# Patient Record
Sex: Male | Born: 1980 | Hispanic: Yes | Marital: Single | Smoking: Current every day smoker
Health system: Southern US, Community
[De-identification: ages and names within clinical notes are randomized; demographics above are authoritative.]

## PROBLEM LIST (undated history)

## (undated) DIAGNOSIS — F10239 Alcohol dependence with withdrawal, unspecified: Secondary | ICD-10-CM

## (undated) DIAGNOSIS — R569 Unspecified convulsions: Secondary | ICD-10-CM

## (undated) DIAGNOSIS — K7460 Unspecified cirrhosis of liver: Secondary | ICD-10-CM

## (undated) DIAGNOSIS — F1010 Alcohol abuse, uncomplicated: Secondary | ICD-10-CM

## (undated) DIAGNOSIS — F10939 Alcohol use, unspecified with withdrawal, unspecified: Secondary | ICD-10-CM

---

## 2014-08-02 ENCOUNTER — Emergency Department (HOSPITAL_COMMUNITY)
Admission: EM | Admit: 2014-08-02 | Discharge: 2014-08-03 | Disposition: A | Discharge status: Home / Self Care | Payer: Self-pay | Attending: Emergency Medicine | Admitting: Emergency Medicine

## 2014-08-02 ENCOUNTER — Encounter (HOSPITAL_COMMUNITY): Payer: Self-pay | Admitting: *Deleted

## 2014-08-02 DIAGNOSIS — M5430 Sciatica, unspecified side: Secondary | ICD-10-CM | POA: Insufficient documentation

## 2014-08-02 DIAGNOSIS — R109 Unspecified abdominal pain: Secondary | ICD-10-CM | POA: Insufficient documentation

## 2014-08-02 DIAGNOSIS — M5432 Sciatica, left side: Secondary | ICD-10-CM

## 2014-08-02 LAB — BASIC METABOLIC PANEL
ANION GAP: 10 (ref 5–15)
BUN: <5 mg/dL — ABNORMAL LOW (ref 6–20)
CALCIUM: 8.7 mg/dL — AB (ref 8.9–10.3)
CO2: 24 mmol/L (ref 22–32)
Chloride: 103 mmol/L (ref 101–111)
Creatinine, Ser: 0.61 mg/dL (ref 0.61–1.24)
GFR calc Af Amer: >60 mL/min (ref >60)
Glucose, Bld: 145 mg/dL — ABNORMAL HIGH (ref 65–99)
POTASSIUM: 3.1 mmol/L — AB (ref 3.5–5.1)
SODIUM: 137 mmol/L (ref 135–145)

## 2014-08-02 LAB — CBC
HEMATOCRIT: 35.5 % — AB (ref 39.0–52.0)
Hemoglobin: 12.8 g/dL — ABNORMAL LOW (ref 13.0–17.0)
MCH: 34.7 pg — AB (ref 26.0–34.0)
MCHC: 36.1 g/dL — ABNORMAL HIGH (ref 30.0–36.0)
MCV: 96.2 fL (ref 78.0–100.0)
Platelets: 152 10*3/uL (ref 150–400)
RBC: 3.69 MIL/uL — ABNORMAL LOW (ref 4.22–5.81)
RDW: 12.5 % (ref 11.5–15.5)
WBC: 5 10*3/uL (ref 4.0–10.5)

## 2014-08-02 LAB — URINALYSIS, ROUTINE W REFLEX MICROSCOPIC
BILIRUBIN URINE: NEGATIVE
Glucose, UA: NEGATIVE mg/dL
Hgb urine dipstick: NEGATIVE
Ketones, ur: NEGATIVE mg/dL
Leukocytes, UA: NEGATIVE
Nitrite: NEGATIVE
Protein, ur: NEGATIVE mg/dL
SPECIFIC GRAVITY, URINE: 1.001 — AB (ref 1.005–1.030)
UROBILINOGEN UA: 0.2 mg/dL (ref 0.0–1.0)
pH: 5.5 (ref 5.0–8.0)

## 2014-08-02 MED ORDER — POTASSIUM CHLORIDE CRYS ER 20 MEQ PO TBCR
40.0000 meq | EXTENDED_RELEASE_TABLET | Freq: Once | ORAL | Status: AC
Start: 2014-08-02 — End: 2014-08-02
  Administered 2014-08-02: 40 meq via ORAL
  Filled 2014-08-02: qty 2

## 2014-08-02 MED ORDER — ONDANSETRON 4 MG PO TBDP
8.0000 mg | ORAL_TABLET | Freq: Once | ORAL | Status: AC
  Administered 2014-08-02: 8 mg via ORAL
  Filled 2014-08-02: qty 2

## 2014-08-02 MED ORDER — OXYCODONE-ACETAMINOPHEN 5-325 MG PO TABS
1.0000 | ORAL_TABLET | Freq: Once | ORAL | Status: AC
  Administered 2014-08-02: 1 via ORAL
  Filled 2014-08-02: qty 1

## 2014-08-02 NOTE — ED Notes (Signed)
Lt flank pain for 2 weeks  No urinary symptoms  No known injury

## 2014-08-02 NOTE — ED Provider Notes (Signed)
CSN: [id number]     Arrival date & time 08/02/14  2017 History   First MD Initiated Contact with Patient 08/02/14 2244     Chief Complaint  Patient presents with  . Flank Pain     (Consider location/radiation/quality/duration/timing/severity/associated sxs/prior Treatment) HPI   34 year old Hispanic male here with L lower back pain.  [employer] interpreter was used.  Patient reports for the past 2 weeks he has had recurrent left low back pain that radiates down to his left leg. Pain is dull and achy sensation worsening with laying flat and with movement. Pain is getting progressively worse. He works in [employer] and it has been affecting his job.  He has been taking over-the-counter medication and receiving massage from his wife without adequate relief. He reports having a fall 3 months ago injuring his back but this pain is different. Pain initially started on the right side but now affecting the left side. There is no associated fever, chills, abdominal pain, dysuria, hematuria, bowel bladder incontinence, saddle anesthesia, focal numbness or weakness. No history of IV drug use, or active cancer. No history of kidney stones in the past. He is here at the urging of his wife due to worsening pain. Pain initially was a 10 out of 10 but has since improved to 3 out of 10 after receiving pain medication in the ER.     History reviewed. No pertinent past medical history. History reviewed. No pertinent past surgical history. No family history on file. History  Substance Use Topics  . Smoking status: Never Smoker   . Smokeless tobacco: Not on file  . Alcohol Use: Yes    Review of Systems  All other systems reviewed and are negative.     Allergies  Review of patient's allergies indicates no known allergies.  Home Medications   Prior to Admission medications   Not on File   BP 132/60 mmHg  Pulse 76  Temp(Src) 98.5 F (36.9 C) (Oral)  Resp 18  SpO2 97% Physical Exam   Constitutional: He appears well-developed and well-nourished. No distress.  HENT:  Head: Atraumatic.  Eyes: Conjunctivae are normal.  Neck: Neck supple.  Abdominal: Soft. There is no tenderness.  Genitourinary:  No CVA tenderness.  Musculoskeletal: He exhibits tenderness (mild lumbar and paraspinal muscle tenderness on palpation without focal point tenderness , crepitus or step-off. No overlying skin changes. Increasing pain with low back flexion and with ambulation).  Neurological: He is alert.  Patellar deep tendon reflex intact bilaterally, no foot drops. 5/5 strength to lower extremities. Able to ambulate.  Skin: No rash noted.  Psychiatric: He has a normal mood and affect.  Nursing note and vitals reviewed.   ED Course  Procedures (including critical care time)  Patient here with radicular back pain consistence with sciatica. No red flags. He is able to ambulate. No symptoms to suggest cauda equina. Do not think advanced imaging is indicated at this time. Plan to provide patient with pain medication, steroid, and work note. Orthopedic referral given as needed.  Labs Review Labs Reviewed  URINALYSIS, ROUTINE W REFLEX MICROSCOPIC (NOT AT [facility]) - Abnormal; Notable for the following:    APPearance CLOUDY (*)    Specific Gravity, Urine 1.001 (*)    All other components within normal limits  BASIC METABOLIC PANEL - Abnormal; Notable for the following:    Potassium 3.1 (*)    Glucose, Bld 145 (*)    BUN <5 (*)    Calcium 8.7 (*)  All other components within normal limits  CBC - Abnormal; Notable for the following:    RBC 3.69 (*)    Hemoglobin 12.8 (*)    HCT 35.5 (*)    MCH 34.7 (*)    MCHC 36.1 (*)    All other components within normal limits    Imaging Review No results found.   EKG Interpretation None      MDM   Final diagnoses:  Left sided sciatica    BP 105/57 mmHg  Pulse 75  Temp(Src) 98.6 F (37 C) (Oral)  Resp 18  SpO2 93%     Fayrene Helper,  PA-C 08/03/14 0022  Pricilla Loveless, MD 08/03/14 [phone]

## 2014-08-03 MED ORDER — OXYCODONE-ACETAMINOPHEN 5-325 MG PO TABS: 1.0000 | ORAL_TABLET | Freq: Four times a day (QID) | ORAL | Status: DC | PRN

## 2014-08-03 MED ORDER — PREDNISONE 20 MG PO TABS: ORAL_TABLET | ORAL | Status: DC

## 2014-08-03 NOTE — Discharge Instructions (Signed)
Ciática °con rehabilitación °(Sciatica with Rehab) °El nervio ciático va desde la región inferior de la espalda hacia la pierna y es el responsable de la sensibilidad y el control de los músculos de la parte de atrás (posterior) del muslo, pierna y pie. La ciática es una enfermedad caracterizada por una inflamación en este nervio.  °SÍNTOMAS °· Signos de daño al nervio, incluso adormecimiento o debilidad en el lado posterior de las extremidades bajas. °· Dolor en la parte posterior del muslo que podría bajar hacia la pierna. °· Dolor que empeora al estar sentado durante largos períodos de tiempo. °· Algunas veces, sensibilidad en las nalgas. °CAUSAS °La causa de la ciática es la inflamación de los nervios ciáticos. La inflamación se debe a que algo irrita el nervio. Entre las causas de la irritación se encuentran: °· Estar sentado durante largos períodos. °· Traumatismos directos al nervio. °· Artritis de la columna. °· Hernia o ruptura de disco. °· Deslizamiento de una vértebra (espondilolistesis). °· Presión de los tejidos blandos, como músculos o el tejidos tipo ligamento (fascia). °LOS RIESGOS AUMENTAN CON: °· Deportes en los que se presiona la columna (fútbol americano o levantamiento de pesas). °· Poca fuerza y flexibilidad. °· No hacer un precalentamiento adecuado. °· Historia familiar de dolor de cintura o trastornos en discos. °· Lesiones o cirugías previas en la espalda. °· Mecánica incorrecta del cuerpo, en especial al levantar, o mala postura. °PREVENCIÓN °· Precalentamiento adecuado y elongación antes de la actividad. °· Mantener la forma física: °¨ Fuerza, flexibilidad y resistencia muscular. °· Capacidad cardiovascular. °· Conozca y use técnicas adecuadas, especialmente en posturas y levantamiento. Cuando sea posible, tener un entrenador que corrija la técnica incorrecta. °· Evite las actividades que tensionen constantemente la columna. °PRONÓSTICO °Si se trata adecuadamente, generalmente es curable  dentro de las 6 semanas. En ocasiones requiere someterse a una cirugía.  °COMPLICACIONES RELACIONADAS °· Daño permanente que incluye dolor, adormecimiento, hormigueo o debilidad. °· Dolor crónico en la espalda. °· Riesgos de la cirugía: infecciones, hemorragias, daño en los nervios, o daños a los tejidos circundantes. °TRATAMIENTO °El tratamiento inicial incluye interrumpir las actividades que agravan los síntomas. Se incluye el uso de medicamentos y la aplicación de hielo para reducir el dolor y la inflamación. Los ejercicios de elongación y fortalecimiento pueden ayudar a reducir el dolor con la actividad. Los ejercicios pueden realizarse en el hogar o con un terapeuta. Un terapeuta podrá recomendarle otros tratamientos, como estimulación nerviosa electrónica transcutánea (TENS) o ultrasonido. En algunos casos se indica una inyección de corticoides para reducir la inflamación del nervio ciático. Si los síntomas persisten por más de 6 meses de tratamiento no quirúrgico (conservador), se indicará la cirugía. °MEDICAMENTOS  °· Si necesita analgésicos, se recomiendan los antiinflamatorios no esteroides, como aspirina e ibuprofeno y otros calmantes menores, como acetaminofeno. °· No tome medicamentos para el dolor dentro de los 7 días previos a la cirugía. °· Los analgésicos prescriptos se indicarán si el médico lo considera necesario. Utilícelos como se le indique y sólo cuando lo necesite. °· Los ungüentos pueden ser beneficiosos. °· En algunos casos se indica una inyección de corticosteroides. Estas inyecciones deben reservarse para los casos graves, porque sólo se pueden administrar una determinada cantidad de veces. °CALOR Y FRÍO  °· El tratamiento con frío alivia el dolor y reduce la inflamación. El frío debe aplicarse durante 10 a 15 minutos cada 2 ó 3 horas para reducir la inflamación y el dolor e inmediatamente después de cualquier actividad que agrava los síntomas.   Utilice bolsas de hielo o masajee la zona  con un trozo de hielo (masaje de hielo). °· El calor puede usarse antes de elongar y de las actividades de fortalecimiento indicadas por el profesional, le fisioterapeuta o el entrenador. Utilice una bolsa térmica o sumerja la lesión en agua caliente. °SOLICITE ATENCIÓN MÉDICA SI: °· El tratamiento parece no ofrecer beneficios, o el trastorno empeora. °· Los medicamentos producen efectos secundarios. °EJERCICIOS °EJERCICIOS DE AMPLITUD DE MOVIMIENTOS Y ELONGACIÓN Ciática °La mayoría de las personas con dolor de ciática encuentran que sus síntomas empeoran con el delantero excesiva flexión (flexión) o arco en la espalda baja (extensión). Los ejercicios que le ayudarán a resolver sus síntomas se centrarán en el movimiento contrario. El médico, fisioterapeuta o entrenador le ayudará a determinar qué ejercicios serán de ayuda para resolver su dolor de espalda. No realice ningún ejercicio sin consultarlo antes con el profesional. Discontinue los ejercicios que empeoran sus síntomas, hasta que hable con el médico. Si siente dolor, entumecimiento u hormigueo que irradia hacia los glúteos, piernas o pies, el objetivo de esta terapia es que estos síntomas se acerquen a la espalda y eventualmente desaparezcan. En ocasiones, los síntomas de la pierna mejorarán, pero el dolor en la espalda puede empeorar; esto es un indicio típico del progreso en la rehabilitación. Asegúrese de que estar atento a cualquier cambio en sus síntomas y las actividades que ha hecho en las 24 horas antes del cambio. Compartir esta información con su médico le permitirá un mejor tratamiento para tratar su enfermedad. °Estos ejercicios le ayudarán en la recuperación de la lesión. Los síntomas podrán aliviarse con o sin asistencia adicional de su médico, fisioterapeuta o entrenador. Al completar estos ejercicios, recuerde:  °· Restaurar la flexibilidad del tejido ayuda a que las articulaciones recuperen el movimiento normal. Esto permite que el  movimiento y la actividad sea más saludables y menos dolorosos. °· Para que sea efectiva, cada elongación debe realizarse durante al menos 30 segundos. °· La elongación nunca debe ser dolorosa. Deberá sentir sólo un alargamiento o distensión suave del tejido que estira. °EJERCICIOS DE AMPLITUD DE MOVIMIENTOS Y ELONGACIÓN: °ELONGACION Flexión - una rodilla al pecho °· Recuéstese en una cama dura o sobre el piso, con ambas piernas extendidas al frente. °· Manteniendo una pierna en contacto con el piso, lleve la rodilla opuesta al pecho. Mantenga la pierna en esa posición, sosteniéndola por la zona posterior del muslo o por la rodilla. °· Presione hasta sentir un suave estiramiento en la cintura. Mantenga esta posición durante __________ segundos. °· Libere la pierna lentamente y repita el ejercicio con el lado opuesto. °Repítalo __________ veces. Realice este estiramiento __________ veces por día.  °ELONGACIÓN Flexión, dos rodillas al pecho  °· Recuéstese en una cama dura o sobre el piso, con ambas piernas extendidas al frente. °· Manteniendo una pierna en contacto con el piso, lleve la rodilla opuesta al pecho. °· Tense los músculos del estómago para apoyar la espalda y levante la otra rodilla hacia el pecho. Mantenga las piernas en su lugar y tómese por detrás de las caderas o las rodillas. °· Con ambas rodillas en el pecho, tire hasta que sienta un estiramiento en la parte trasera de la espalda. Mantenga esta posición durante __________ segundos. °· Tense los músculos del estómago y baje las piernas de a una por vez. °Repítalo __________ veces. Realice este estiramiento __________ veces por día.  °ELONGACIÓN Rotación de la zona baja del tronco. °· Recuéstese sobre una cama firme o sobre el suelo.   Mantenga las piernas al frente, doble las rodillas de modo que ambas apunten hacia el techo y los pies queden bien apoyados en el piso. °· Extienda los brazos a cada lado. Esto estabilizará la zona superior del cuerpo,  manteniendo los hombros en contacto con el piso. °· Con cuidado y lentamente deje caer ambas rodillas juntas hacia un lado, hasta que sienta un suave estiramiento en la espalda baja. Mantenga esta posición durante __________ segundos. °· Tense los músculos del estómago para apoyar la espalda y lleve las rodillas a la posición inicial. Repita el ejercicio hacia el otro lado. °Repítalo __________ veces. Realice este ejercicio __________ veces por día. °EJERCICIOS DE AMPLITUD DE MOVIMIENTOS Y FLEXIBILIDAD: °ELONGACIÓN Extensión posición prona sobre los codos °· Acuéstese sobre el estómago sobre el piso, una cama será muy blanda. Coloque las palmas a una distancia igual al ancho de los hombres y a la altura de la cabeza. °· Coloque los codos bajo los hombros. Si siente dolor, colóquese almohadas debajo del pecho. °· Deje que su cuerpo se relaje, de modo que las caderas queden más abajo y tengan más contacto con el piso. °· Mantenga esta posición durante __________ segundos. °· Vuelva lentamente a la posición plana sobre el piso. °Repítalo __________ veces. Realice este estiramiento __________ veces por día.  °AMPLITUD DE MOVIMIENTOS - Extensión - flexión de brazos en posición prona °· Acuéstese sobre el estómago sobre el piso, una cama será muy blanda. Coloque las palmas a una distancia igual al ancho de los hombres y a la altura de la cabeza. °· Mantenga la espalda tan relajada como pueda, enderece lentamente los codos mientras mantiene las caderas contra el suelo. Puede modificar la posición de las manos para estar más cómodo. A medida que gana movimiento, sus manos quedarán más por debajo de los hombros. °· Mantenga cada posición durante __________ segundos. °· Vuelva lentamente a la posición plana sobre el piso. °Repítalo __________ veces. Realice este estiramiento __________ veces por día.  °EJERCICIOS DE FORTALECIMIENTO - Ciática °Estos ejercicios le ayudarán en la recuperación de la lesión. Estos ejercicios deben  hacerse cerca de su "punto dulce". Este es el arco neutro, de la parte baja de la espalda, en algún lugar entre la posición completamente redondeada y arqueada plenamente, que es la posición menos dolorosa. Cuando se realiza en este nivel de seguridad del movimiento, estos ejercicios se pueden utilizar para las personas que tienen una lesión basada en flexión o extensión. Con estos ejercicios, los síntomas podrán desaparecer con o sin mayor intervención del profesional, el fisioterapeuta o el entrenador. Al completar estos ejercicios, recuerde:  °· Los músculos pueden ganar tanto la resistencia como la fuerza que necesita para sus actividades diarias a través de ejercicios controlados. °· Realice los ejercicios como se lo indicó el médico, el fisioterapeuta o el entrenador. Avance sólo con los ejercicios de resistencia y haga las repeticiones que su médico le indique. °· Podrá experimentar dolor o cansancio muscular, pero el dolor o molestia que trata de eliminar a través de los ejercicios nunca debe empeorar. Si el dolor empeora, deténgase y asegúrese de que está siguiendo las directivas correctamente. Si aún siente dolor luego de realizar lo ajustes necesarios, deberá discontinuar el ejercicio hasta que pueda conversar con el profesional sobre el problema. °FORTALECIMIENTO - Abdominales profundos - Inclinación pélvica °· Recuéstese sobre una cama firme o sobre el suelo. Mantenga las piernas al frente, doble las rodillas de modo que ambas apunten hacia el techo y los pies queden bien apoyados en   el piso. °· Tensione la zona baja de los músculos abdominales para presionar la cintura contra el piso. Este movimiento hará rotar su pelvis de modo que el cóccix quede hacia arriba y no apuntando a los pies o hacia el piso. °· Con una tensión suave y respiración pareja, mantenga esta posición durante __________ segundos. °Repítalo __________ veces. Realice este estiramiento __________ veces por día.  °FORTALECIMIENTO -  Abdominales encogimiento abdominal °· Recuéstese sobre una cama firme o sobre el suelo. Mantenga las piernas al frente, doble las rodillas de modo que ambas apunten hacia el techo y los pies queden bien apoyados en el piso. Cruce las manos sobre el pecho. °· Apunte suavemente con la barbilla hacia abajo, sin doblar el cuello. °· Tensione los abdominales y eleve lentamente el tronco la altura suficiente para despegar los omóplatos. Si se eleva más, pondrá tensión excesiva en la cintura y esto no fortalecerá más los abdominales. °· Controle la vuelta a la posición inicial. °Repítalo __________ veces. Realice este estiramiento __________ veces por día.  °FORTALECIMIENTO - En cuadrúpedo, elevación de miembro superior e inferior opuestos °· Coloque las manos y las rodillas en una superficie firme. Las manos deben quedar a la altura de los hombros y las rodillas debajo de las caderas. Puede colocar algo debajo las rodillas para estar más cómodo. °· Encuentre la posición neutral de la columna vertebral y tensione ligeramente los músculos abdominales de modo que pueda mantener esta posición. Los hombros y las caderas deben formar un rectángulo paralelo con el suelo y recto. °· Manteniendo el tronco firme, eleve la mano derecha a la altura del hombro y luego eleve la pierna izquierda a la altura de la cadera. Asegúrese de no contener la respiración. Mantenga cada posición durante __________ segundos. °· Con los músculos abdominales en tensión y la espalda firme, vuelva lentamente a la posición inicial. Repita con el otro brazo y la otra pierna. °Repítalo __________ veces. Realice este estiramiento __________ veces por día.  °FUERZA Abdominales y cuádriceps - Levantar las piernas rectas °· Recuéstese en una cama dura o sobre el piso, con ambas piernas extendidas al frente. °· Deje una pierna en contacto con el suelo y doble la otra rodilla de manera que el pie quede contra el suelo. °· Encuentre la posición neutral de la  columna vertebral y tensione ligeramente los músculos abdominales de modo que pueda mantener esta posición. °· Levante lentamente la pierna del suelo una 6 pulgadas y cuente hasta 15, asegúrese de no contener la respiración. °· Mantega la columna en posición neutral, y baje lentamente la pierna hasta el suelo. °Repita el ejercicio con cada pierna __________ veces. Realice este estiramiento __________ veces por día. °CONSIDERACIONES ACERCA DE LA POSTURA Y LA MECÁNICA DEL CUERPO Ciática °Si mantiene una postura correcta cuando se encuentre de pie, sentado o realizando sus actividades, reducirá el estrés en los diferentes tejidos del cuerpo, y permitirá a los tejidos lesionados la posibilidad de curarse y limitar las experiencias dolorosas. A continuación se indican pautas generales para mejorar la postura- Su médico o fisioterapeuta le dará instrucciones específicas según sus necesidades. Al leer estas pautas recuerde: °· Los ejercicios indicados por su médico lo ayudarán a lograr la flexibilidad y la fuerza para mantener las posturas correctas. °· Una postura correcta le proporciona a sus articulaciones el medio óptimo para funcionar bien. Las articulaciones se desgastan menos cuando están sostenidas adecuadamente por una columna vertebral en buena postura. Esto significa que su cuerpo estará más sano y dolerá menos. °·   La correcta postura debe practicarse en todas las actividades, especialmente al estar sentado o de pie durante mucho tiempo. También es importante al realizar actividades repetitivas de bajo estrés (tipeo) o una actividad única y pesada. °POSICIONES DE DESCANSO °Tenga en cuenta cuáles son las posturas que más dolor le causan al elegir una posición de descanso. Si siente dolor con las actividades en que deba realizar una flexión (sentarse, inclinarse, detenerse, ponerse en cuclillas), elija una posición que le permita descansar en una postura menos flexionada. Evite curvarse en posición fetal cuando se  encuentre de lado. Si el dolor empeora con las actividades basadas en la extensión (estar de pie durante un tiempo prolongado, trabajar con las manos por arriba de la cabeza) evite descansar en una posición extendida durante mucho tiempo, como dormir sobre el estómago. La mayoría de las personas encontrará cómodo el descanso sobre la columna vertebral en una posición neutral, ni muy redondeada ni muy arqueada. Recuéstese sobre su lado en una cama que no esté hundida con una almohada entre las rodillas o sobre la espalda con una almohada bajo las rodillas, y sentirá alivio. Tenga en cuenta que cualquier posición en tiempo prolongado, no importa si es una postura correcta, puede provocarle rigidez. °POSTURAS CORRECTAS PARA SENTARSE °Con el fin de minimizar el estrés y el malestar en su columna, deberá sentarse con la postura correcta. Esto le ayudará a que el cuerpo esté más sano. Recuperar una buena postura es un proceso gradual. Muchas personas pueden trabajar más cómodas mediante el uso de diferentes soportes hasta que tengan la flexibilidad y la fuerza para mantener esta postura por su cuenta. °Al sentarse con la postura correcta, los oídos deben estar sobre los hombros y los hombros sobre las caderas.  Debe utilizar el respaldo de la silla para apoyar la espalda. La espalda estará en una posición neutral, ligeramente arqueada. Puede colocar una pequeña almohada o toalla doblada en la base de la espalda baja para apoyarse.  °Si trabaja en un escritorio, cree un ambiente que le proporciones un buen soporte y una postura erguida. Sin soporte extra, los músculos se fatigan y causan tensión excesiva en las articulaciones y otros tejidos. Tenga en cuenta estas recomendaciones: °SILLA:  °· La silla debe poder deslizarse por debajo del escritorio cuando su espalda tome contacto con el respaldo. Esto le permitirá trabajar más cerca. °· La altura de la silla debe permitirle que los ojos tengan el nivel de la parte superior  del monitor y las manos estén más abajo que los codos. °POSICIÓN DEL CUERPO °· Los pies deben tener contacto con el piso. Si no es posible, use un posapies. °· Mantenga las orejas sobre los hombros. Esto reducirá el estrés en el cuello y en la cintura. °POSTURAS INCORRECTAS PARA SENTARSE °· Si se siente cansado e incapaz de asumir una postura sentada sana, no se encorve ni se hunda. Esto pone una tensión excesiva en los tejidos de su espalda, y causa más daño y dolor. Entre las opciones más saludables se incluyen: °· El uso de más apoyo, como una almohada lumbar. °· Cambio de tareas, a algo que demande una posición vertical o caminar. °· Tomar una breve caminata. °· Recostarse y descansar en una posición neutral. °DE PIE DURANTE UN TIEMPO PROLONGADO E INCLINADO LIGERAMENTE HACIA ADELANTE °Cuando deba realizar una tarea que requiera inclinación hacia adelante estando de pie en el mismo sitio durante mucho tiempo, coloque un pie en un objeto de 2 a 4 pulgadas de alto, para mantener una mejor postura.   Cuando ambos pies están en el piso, la zona inferior de la espalda tiene a perder su ligera curvatura hacia adentro. Si esta curva se aplana (o se pronuncia demasiado) la espalda y las articulaciones experimentarán demasiado estrés, se fatigarán más rápidamente y causarán dolor.  °POSTURAS CORRECTAS PARA ESTAR DE PIE °Una postura adecuada de pie realizarse en todas las actividades diarias, incluso si sólo toman un momento, como al cepillarse los dientes. Como en la postura de sentado, los oídos deben estar sobre los hombros y los hombros sobre las caderas.  Deberá mantener una ligera tensión en sus músculos abdominales para asegurar la columna vertebral. El cóccix debe apuntar hacia el suelo, no detrás de su cuerpo, por que resulta en una curvatura de la espalda sobre-extendida.  °POSTURAS INCORRECTAS PARA ESTAR DE PIE °Las posturas incorrectas para estar de pie incluyen tener la cabeza hacia delante, las rodillas  bloqueadas o una excesiva curvatura de la espalda. °CAMINAR °Camine en una postura erguida. Las orejas, hombros y caderas deben estar alineados. °ACTIVIDAD PROLONGADA EN UNA POSICIÓN FLEXIONADA °Al completar una tarea que requiere que se doble la cintura hacia adelante o inclinarse sobre una superficie baja, trate de encontrar una manera de estabilizar 3 de cada 4 de sus miembros. Puede colocar una mano o el codo en el muslo, o descansar una rodilla en la superficie en la que está apoyado. Esto le proporcionará más estabilidad para que sus músculos no se cansen tan rápidamente.  El mantener las rodillas relajadas, o ligeramente dobladas, también reducirá el estrés en la espalda baja. °TÉCNICAS CORRECTAS PARA LEVANTAR OBJETOS °SI:  °· Asumir una postura amplia. Esto le proporcionará más estabilidad y la oportunidad de acercarse lo más posible al objeto que se está levantando. °· Tense los abdominales para asegurar la columna vertebral; luego flexione rodillas y caderas. Manteniendo la espalda en una posición neutral, haga el esfuerzo con los músculos de la pierna.  Levántese con las piernas, manteniendo la espalda derecha. °· Pruebe el peso de los objetos desconocidos antes de tratar de levantarlos. °· Trate de mantener los codos hacia abajo y a los lados, con el fin de obtener la fuerza de los hombros al llevar un objeto. °· Siempre pida ayuda a otra persona cuando deba levantar objetos pesados o incómodos °TÉCNICAS INCORRECTAS PARA LEVANTAR OBJETOS °NO:  °· Bloquee rodillas al levantar, aunque sea un objeto pequeño. °· Se doble ni gire. Gire sobre los pies ni los mueva cuando necesite cambiar de dirección. °· Tome conciencia que no puede levantar con seguridad ni un clip de papel, sin una postura correcta. °Document Released: 12/15/2008 Document Revised: 03/21/2011 °[employer]® Patient Information ©2015 [employer]. This information is not intended to replace advice given to you by your health care provider. Make  sure you discuss any questions you have with your health care provider. ° °

## 2015-08-11 ENCOUNTER — Encounter (HOSPITAL_COMMUNITY): Payer: Self-pay | Admitting: Emergency Medicine

## 2015-08-11 ENCOUNTER — Emergency Department (HOSPITAL_COMMUNITY)
Admission: EM | Admit: 2015-08-11 | Discharge: 2015-08-11 | Disposition: A | Discharge status: Home / Self Care | Payer: Self-pay | Attending: Emergency Medicine | Admitting: Emergency Medicine

## 2015-08-11 DIAGNOSIS — S0501XA Injury of conjunctiva and corneal abrasion without foreign body, right eye, initial encounter: Secondary | ICD-10-CM | POA: Insufficient documentation

## 2015-08-11 DIAGNOSIS — Y990 Civilian activity done for income or pay: Secondary | ICD-10-CM | POA: Insufficient documentation

## 2015-08-11 DIAGNOSIS — Y929 Unspecified place or not applicable: Secondary | ICD-10-CM | POA: Insufficient documentation

## 2015-08-11 DIAGNOSIS — X58XXXA Exposure to other specified factors, initial encounter: Secondary | ICD-10-CM | POA: Insufficient documentation

## 2015-08-11 DIAGNOSIS — Y939 Activity, unspecified: Secondary | ICD-10-CM | POA: Insufficient documentation

## 2015-08-11 MED ORDER — FLUORESCEIN SODIUM 1 MG OP STRP
1.0000 | ORAL_STRIP | Freq: Once | OPHTHALMIC | Status: AC
  Administered 2015-08-11: 1 via OPHTHALMIC
  Filled 2015-08-11: qty 1

## 2015-08-11 MED ORDER — TETRACAINE HCL 0.5 % OP SOLN
2.0000 [drp] | Freq: Once | OPHTHALMIC | Status: AC
  Administered 2015-08-11: 2 [drp] via OPHTHALMIC
  Filled 2015-08-11: qty 2

## 2015-08-11 MED ORDER — ERYTHROMYCIN 5 MG/GM OP OINT: 1.0000 "application " | TOPICAL_OINTMENT | Freq: Four times a day (QID) | OPHTHALMIC | 0 refills | Status: DC

## 2015-08-11 NOTE — Discharge Instructions (Signed)
Please read and follow all provided instructions.  Your diagnoses today include:  1. Corneal abrasion, right, initial encounter     Tests performed today include: Visual acuity testing to check your vision Fluorescein dye examination to look for scratches on your eye Vital signs. See below for your results today.   Medications prescribed:   Take any prescribed medications only as directed.  Home care instructions:  Follow any educational materials contained in this packet.  You have a scratch of the eye on the cornea (the clear part of the eye). This condition may be caused by trauma. It is a common problem for people who wear contact lenses. Proper treatment is important. No evidence of infection is noted today, but you could develop an infection called a corneal ulcer or have some retained foreign body that may or may not have been noted today in the Emergency Department. Ulcers are not only painful, but they may also scar the cornea and cause permanent damage to the eye.   If you wear contact lenses, do not use them until your eye caregiver approves. Follow-up care is necessary to be sure the corneal abrasion is healing if not completely resolved in 2-3 days. See your caregiver or eye specialist as suggested for followup.   Follow-up instructions: Please follow-up with the opthalmologist listed in the next 2-3 days for further evaluation of your symptoms.   Return instructions:  Please return to the Emergency Department if you experience worsening symptoms.  Please return immediately if you develop severe pain, pus drainage, new change in vision, or fever. Please return if you have any other emergent concerns.  Additional Information:  Your vital signs today were: BP 131/83 (BP Location: Right Arm)    Pulse 95    Temp 98.2 F (36.8 C) (Oral)    Resp 18    SpO2 97%  If your blood pressure (BP) was elevated above 135/85 this visit, please have this repeated by your doctor within  one month.

## 2015-08-11 NOTE — ED Triage Notes (Signed)
Pt. 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

## 2015-08-11 NOTE — ED Provider Notes (Signed)
[facility]-EMERGENCY DEPT Provider Note   CSN: [id number] Arrival date & time: 08/11/15  2052  First Provider Contact:   First MD Initiated Contact with Patient 08/11/15 2129     By signing my name below, I, Soijett Blue, attest that this documentation has been prepared under the direction and in the presence of Audry Pili, PA-C Electronically Signed: Soijett Blue, ED Scribe. 08/11/15. 9:44 PM.    History   Chief Complaint Chief Complaint  Patient presents with  . Foreign Body in Eye    HPI Mathew [name] is a 35 y.o. male who presents to the Emergency Department complaining of foreign body to right eye onset yesterday. Pt notes that he was sanding stone when some remnants got into his right eye. Pt denies wearing protective goggles at the time of the incident. He states that he is having associated symptoms of 10/10 right eye pain, redness to right eye, and watery right eye. He states that he has tried OTC visine with no relief for his symptoms. He denies fever, chills, color change, rash, wound, and any other symptoms.  The history is provided by the patient and the spouse. A language interpreter was used (Wife and [place]).    History reviewed. No pertinent past medical history.  There are no active problems to display for this patient.   History reviewed. No pertinent surgical history.     Home Medications    Prior to Admission medications   Medication Sig Start Date End Date Taking? Authorizing Provider  oxyCODONE-acetaminophen (PERCOCET/ROXICET) 5-325 MG per tablet Take 1 tablet by mouth every 6 (six) hours as needed for severe pain. 08/03/14   Fayrene Helper, PA-C    Family History No family history on file.  Social History Social History  Substance Use Topics  . Smoking status: Never Smoker  . Smokeless tobacco: Not on file  . Alcohol use Yes     Allergies   Review of patient's allergies indicates no known allergies.   Review of Systems Review of Systems    Constitutional: Negative for chills and fever.  Eyes: Positive for pain (right ) and redness (right eye).       Foreign body sensation to right eye. Watery right eye.   Skin: Negative for color change, rash and wound.     Physical Exam Updated Vital Signs BP 131/83 (BP Location: Right Arm)   Pulse 95   Temp 98.2 F (36.8 C) (Oral)   Resp 18   SpO2 97%   Physical Exam  Constitutional: He is oriented to person, place, and time. He appears well-developed and well-nourished. No distress.  HENT:  Head: Normocephalic and atraumatic.  Eyes: EOM are normal. Lids are everted and swept, no foreign bodies found.  Slit lamp exam:      The right eye shows corneal abrasion and fluorescein uptake.  Positive fluorescein uptake. Corneal abrasion to the 6 o'clock position of right eye. EOM intact. Pupils equal and reactive. Vision unchanged   Neck: Neck supple.  Cardiovascular: Normal rate.   Pulmonary/Chest: Effort normal. No respiratory distress.  Abdominal: He exhibits no distension.  Musculoskeletal: Normal range of motion.  Neurological: He is alert and oriented to person, place, and time.  Skin: Skin is warm and dry.  Psychiatric: He has a normal mood and affect. His behavior is normal.  Nursing note and vitals reviewed.  ED Treatments / Results  DIAGNOSTIC STUDIES: Oxygen Saturation is 97% on RA, nl by my interpretation.    COORDINATION OF CARE: 9:42  PM Discussed treatment plan with pt at bedside which includes abx eye drops and referral and follow up with opthalmology,  and pt agreed to plan.     Procedures Procedures (including critical care time)  Medications Ordered in ED Medications  fluorescein ophthalmic strip 1 strip (1 strip Right Eye Given 08/11/15 2120)  tetracaine (PONTOCAINE) 0.5 % ophthalmic solution 2 drop (2 drops Right Eye Given 08/11/15 2120)     Initial Impression / Assessment and Plan / ED Course  I have reviewed the triage vital signs and the nursing  notes.   Clinical Course    Final Clinical Impressions(s) / ED Diagnoses  I have reviewed the relevant previous healthcare records. I obtained HPI from historian.   ED Course:  Assessment: Pt with corneal abrasion on exam. No evidence of FB. Exam not concerning for orbital cellulitis, hyphema. No concern for uveitis. Patient will be discharged home with erythromycin Rx. Patient understands to follow up with ophthalmology; return precautions discussed. Patient appears safe for discharged.   Disposition/Plan:  DC Home Additional Verbal discharge instructions given and discussed with patient.  Pt Instructed to f/u with Optho in the next week for evaluation and treatment of symptoms. Return precautions given Pt acknowledges and agrees with plan  Supervising Physician Margarita Grizzle, MD   Final diagnoses:  Corneal abrasion, right, initial encounter    New Prescriptions New Prescriptions   No medications on file   I personally performed the services described in this documentation, which was scribed in my presence. The recorded information has been reviewed and is accurate.     Audry Pili, PA-C 08/11/15 2149    Margarita Grizzle, MD 08/15/15 [phone]

## 2015-08-11 NOTE — ED Notes (Signed)
Patient able to ambulate independently  

## 2015-10-16 ENCOUNTER — Emergency Department (HOSPITAL_COMMUNITY)
Admission: EM | Admit: 2015-10-16 | Discharge: 2015-10-16 | Disposition: A | Discharge status: Home / Self Care | Payer: Self-pay | Attending: Emergency Medicine | Admitting: Emergency Medicine

## 2015-10-16 ENCOUNTER — Encounter (HOSPITAL_COMMUNITY): Payer: Self-pay

## 2015-10-16 ENCOUNTER — Emergency Department (HOSPITAL_COMMUNITY): Payer: Self-pay

## 2015-10-16 DIAGNOSIS — R569 Unspecified convulsions: Secondary | ICD-10-CM | POA: Insufficient documentation

## 2015-10-16 DIAGNOSIS — R930 Abnormal findings on diagnostic imaging of skull and head, not elsewhere classified: Secondary | ICD-10-CM | POA: Insufficient documentation

## 2015-10-16 LAB — CBC WITH DIFFERENTIAL/PLATELET
Basophils Absolute: 0 10*3/uL (ref 0.0–0.1)
Basophils Relative: 0 %
EOS ABS: 0.1 10*3/uL (ref 0.0–0.7)
EOS PCT: 3 %
HCT: 37.3 % — ABNORMAL LOW (ref 39.0–52.0)
Hemoglobin: 12.7 g/dL — ABNORMAL LOW (ref 13.0–17.0)
LYMPHS ABS: 0.6 10*3/uL — AB (ref 0.7–4.0)
LYMPHS PCT: 12 %
MCH: 34 pg (ref 26.0–34.0)
MCHC: 34 g/dL (ref 30.0–36.0)
MCV: 100 fL (ref 78.0–100.0)
MONOS PCT: 7 %
Monocytes Absolute: 0.3 10*3/uL (ref 0.1–1.0)
Neutro Abs: 3.7 10*3/uL (ref 1.7–7.7)
Neutrophils Relative %: 78 %
PLATELETS: 95 10*3/uL — AB (ref 150–400)
RBC: 3.73 MIL/uL — ABNORMAL LOW (ref 4.22–5.81)
RDW: 12 % (ref 11.5–15.5)
WBC: 4.7 10*3/uL (ref 4.0–10.5)

## 2015-10-16 LAB — BASIC METABOLIC PANEL
Anion gap: 13 (ref 5–15)
BUN: 5 mg/dL — ABNORMAL LOW (ref 6–20)
CO2: 21 mmol/L — AB (ref 22–32)
CREATININE: 0.65 mg/dL (ref 0.61–1.24)
Calcium: 9.3 mg/dL (ref 8.9–10.3)
Chloride: 102 mmol/L (ref 101–111)
GFR calc Af Amer: >60 mL/min (ref >60)
GFR calc non Af Amer: >60 mL/min (ref >60)
GLUCOSE: 107 mg/dL — AB (ref 65–99)
POTASSIUM: 3.3 mmol/L — AB (ref 3.5–5.1)
Sodium: 136 mmol/L (ref 135–145)

## 2015-10-16 LAB — ETHANOL

## 2015-10-16 LAB — CBG MONITORING, ED: Glucose-Capillary: 107 mg/dL — ABNORMAL HIGH (ref 65–99)

## 2015-10-16 MED ORDER — FOLIC ACID 1 MG PO TABS: 1.0000 mg | ORAL_TABLET | Freq: Every day | ORAL | 0 refills | Status: DC

## 2015-10-16 MED ORDER — POTASSIUM CHLORIDE CRYS ER 20 MEQ PO TBCR
40.0000 meq | EXTENDED_RELEASE_TABLET | Freq: Once | ORAL | Status: AC
  Administered 2015-10-16: 40 meq via ORAL
  Filled 2015-10-16: qty 2

## 2015-10-16 MED ORDER — LORAZEPAM 2 MG/ML IJ SOLN
1.0000 mg | Freq: Once | INTRAMUSCULAR | Status: AC
Start: 2015-10-16 — End: 2015-10-16
  Administered 2015-10-16: 1 mg via INTRAVENOUS
  Filled 2015-10-16: qty 1

## 2015-10-16 MED ORDER — VITAMIN B-1 100 MG PO TABS
100.0000 mg | ORAL_TABLET | Freq: Every day | ORAL | Status: DC
  Administered 2015-10-16: 100 mg via ORAL
  Filled 2015-10-16: qty 1

## 2015-10-16 MED ORDER — SODIUM CHLORIDE 0.9 % IV BOLUS (SEPSIS)
1000.0000 mL | Freq: Once | INTRAVENOUS | Status: AC
  Administered 2015-10-16: 1000 mL via INTRAVENOUS

## 2015-10-16 MED ORDER — THIAMINE HCL 100 MG/ML IJ SOLN: 100.0000 mg | Freq: Every day | INTRAMUSCULAR | Status: DC

## 2015-10-16 MED ORDER — VITAMIN B-1 100 MG PO TABS: 100.0000 mg | ORAL_TABLET | Freq: Every day | ORAL | 0 refills | Status: DC

## 2015-10-16 NOTE — ED Notes (Signed)
CBG 107 

## 2015-10-16 NOTE — ED Triage Notes (Signed)
Pt presents with witnessed seizure by coworker that lasted approximately 4-5 minutes.  Per EMS, pt was postictal on scene but due to language barrier, unsure of GCS.  No incontinence reported.  Pt is heavy daily drinker

## 2015-10-16 NOTE — ED Notes (Signed)
Pt ambulated in hall; tolerated well 

## 2015-10-16 NOTE — ED Provider Notes (Signed)
[facility] Provider Note   CSN: [id number] Arrival date & time: 10/16/15  0804     History   Chief Complaint Chief Complaint  Patient presents with  . Seizures    HPI Mathew [name] is a 35 y.o. male.  HPI   35 year old Hispanic male brought here via EMS for evaluation of a witnessed seizure activity. History obtained through interpreter via video conference. Patient reports he was going to work with his coworker this a.m. When they stopped at a gas station to get gas, he had a witnessed seizure episode that may have lasted for several minutes. The only thing that he can read member was finding himself in the ambulance on his way to the ER. At this time patient denies having any specific symptoms. He denies having any headache, lightheadedness, dizziness, chest pain, shortness of breath, abdominal pain, back pain, focal numbness or weakness. He denies tongue biting, bowel bladder incontinence. He has never had seizures before. He admits to drinking alcohol on a daily basis, last alcohol consumption was yesterday and quantify as two 24 ounce beer which is his usual amount of consumption.  He however admits that he has a drinking problem. He denies tobacco abuse or any recreational drug use. He is not on any daily medication. Denies any other environmental changes. Family member who is at bedside states that patient does abuse alcohol. He also has a daily tremor.  History reviewed. No pertinent past medical history.  There are no active problems to display for this patient.   History reviewed. No pertinent surgical history.     Home Medications    Prior to Admission medications   Medication Sig Start Date End Date Taking? Authorizing Provider  erythromycin [facility]) ophthalmic ointment Place 1 application into the right eye 4 (four) times daily. For 5 days 08/11/15   Audry Pili, PA-C  oxyCODONE-acetaminophen (PERCOCET/ROXICET) 5-325 MG per tablet Take 1 tablet by mouth every 6  (six) hours as needed for severe pain. 08/03/14   Fayrene Helper, PA-C    Family History History reviewed. No pertinent family history.  Social History Social History  Substance Use Topics  . Smoking status: Never Smoker  . Smokeless tobacco: Never Used  . Alcohol use Yes     Allergies   Review of patient's allergies indicates no known allergies.   Review of Systems Review of Systems  All other systems reviewed and are negative.    Physical Exam Updated Vital Signs BP 133/78   Pulse 96   Temp 98.1 F (36.7 C) (Oral)   Resp 20   SpO2 99%   Physical Exam  Constitutional: He appears well-developed and well-nourished. No distress.  Hispanic male, laying in bed in no acute discomfort.  HENT:  Head: Atraumatic.  No tongue injury, no scalp tenderness or facial tenderness.  Eyes: Conjunctivae and EOM are normal. Pupils are equal, round, and reactive to light.  Neck: Normal range of motion. Neck supple.  No cervical midline spine tenderness crepitus or step off  Cardiovascular: Normal rate and regular rhythm.   Pulmonary/Chest: Effort normal and breath sounds normal.  Abdominal: Soft. Bowel sounds are normal. There is no tenderness.  Musculoskeletal:  5 out 5 strength to all 4 extremities  Neurological: He is alert.  Essential tremor to both hands.  Skin: No rash noted.  Psychiatric: He has a normal mood and affect.  Nursing note and vitals reviewed.    ED Treatments / Results  Labs (all labs ordered are listed, but  only abnormal results are displayed) Labs Reviewed  BASIC METABOLIC PANEL - Abnormal; Notable for the following:       Result Value   Potassium 3.3 (*)    CO2 21 (*)    Glucose, Bld 107 (*)    BUN 5 (*)    All other components within normal limits  CBC WITH DIFFERENTIAL/PLATELET - Abnormal; Notable for the following:    RBC 3.73 (*)    Hemoglobin 12.7 (*)    HCT 37.3 (*)    Platelets 95 (*)    Lymphs Abs 0.6 (*)    All other components within  normal limits  CBG MONITORING, ED - Abnormal; Notable for the following:    Glucose-Capillary 107 (*)    All other components within normal limits  ETHANOL  URINE RAPID DRUG SCREEN, HOSP PERFORMED  URINALYSIS, ROUTINE W REFLEX MICROSCOPIC (NOT AT [facility])    EKG  EKG Interpretation None       Radiology Ct Head Wo Contrast  Result Date: 10/16/2015 CLINICAL DATA:  Seizure. EXAM: CT HEAD WITHOUT CONTRAST TECHNIQUE: Contiguous axial images were obtained from the base of the skull through the vertex without intravenous contrast. COMPARISON:  None FINDINGS: Brain: No evidence of acute infarction, hemorrhage, hydrocephalus, extra-axial collection or mass lesion/mass effect. Vascular: No hyperdense vessel or unexpected calcification. Skull: Normal. Negative for fracture or focal lesion. Sinuses/Orbits: No acute finding. Other: None. IMPRESSION: 1. Normal brain.  No acute intracranial abnormalities identified. Electronically Signed   By: Signa Kell M.D.   On: 10/16/2015 09:57    Procedures Procedures (including critical care time)  Medications Ordered in ED Medications  thiamine (B-1) injection 100 mg (100 mg Intravenous Not Given 10/16/15 1003)  thiamine (VITAMIN B-1) tablet 100 mg (100 mg Oral Given 10/16/15 0849)  LORazepam (ATIVAN) injection 1 mg (1 mg Intravenous Given 10/16/15 0849)  sodium chloride 0.9 % bolus 1,000 mL (0 mLs Intravenous Stopped 10/16/15 1028)  potassium chloride SA (K-DUR,KLOR-CON) CR tablet 40 mEq (40 mEq Oral Given 10/16/15 1208)     Initial Impression / Assessment and Plan / ED Course  I have reviewed the triage vital signs and the nursing notes.  Pertinent labs & imaging results that were available during my care of the patient were reviewed by me and considered in my medical decision making (see chart for details).  Clinical Course    BP 105/65   Pulse 81   Temp 98.1 F (36.7 C) (Oral)   Resp 16   SpO2 99%    Final Clinical Impressions(s) / ED  Diagnoses   Final diagnoses:  Alcohol related seizure (HCC)    New Prescriptions New Prescriptions   FOLIC ACID (FOLVITE) 1 MG TABLET    Take 1 tablet (1 mg total) by mouth daily.   THIAMINE (VITAMIN B-1) 100 MG TABLET    Take 1 tablet (100 mg total) by mouth daily.   9:14 AM Patient here with new onset seizure suspect likely from alcohol abuse. No significant signs of physical injury. Does exhibits a tremor which is not new. Workup initiated. Will monitor closely.  1:22 PM Head CT scan without acute abnormalities. Mild hypokalemia with a potassium of 3.3, supplementation given. Electrolytes otherwise reassuring. Patient is back to his baseline. He is able to ambulate without difficulty. Patient understands to quit drinking alcohol as this may worsen his condition. I encouraged patient to follow-up with neurology for further management of his condition. He should avoid driving or operating heavy machinery until cleared by  neurologist. Return precaution discussed. Language interpreter was used to facilitate communication.   Fayrene HelperBowie Kacia Halley, PA-C 10/16/15 1323    Charlynne Panderavid Hsienta Yao, MD 10/16/15 2004

## 2015-10-16 NOTE — Discharge Instructions (Signed)
Please avoid driving and follow up with neurology next week for further evaluation of your seizure.  Your seizure is likely due to alcohol abuse.  Please quit drinking alcohol to prevent further seizure. Return if your condition worsen.

## 2016-03-10 ENCOUNTER — Emergency Department (HOSPITAL_COMMUNITY)
Admission: EM | Admit: 2016-03-10 | Discharge: 2016-03-11 | Disposition: A | Discharge status: Home / Self Care | Payer: Self-pay | Attending: Emergency Medicine | Admitting: Emergency Medicine

## 2016-03-10 ENCOUNTER — Encounter (HOSPITAL_COMMUNITY): Payer: Self-pay

## 2016-03-10 DIAGNOSIS — R1010 Upper abdominal pain, unspecified: Secondary | ICD-10-CM

## 2016-03-10 DIAGNOSIS — K2920 Alcoholic gastritis without bleeding: Secondary | ICD-10-CM | POA: Insufficient documentation

## 2016-03-10 DIAGNOSIS — R7989 Other specified abnormal findings of blood chemistry: Secondary | ICD-10-CM | POA: Insufficient documentation

## 2016-03-10 DIAGNOSIS — F1010 Alcohol abuse, uncomplicated: Secondary | ICD-10-CM | POA: Insufficient documentation

## 2016-03-10 DIAGNOSIS — R945 Abnormal results of liver function studies: Secondary | ICD-10-CM

## 2016-03-10 LAB — COMPREHENSIVE METABOLIC PANEL
ALT: 74 U/L — AB (ref 17–63)
AST: 199 U/L — ABNORMAL HIGH (ref 15–41)
Albumin: 4.6 g/dL (ref 3.5–5.0)
Alkaline Phosphatase: 134 U/L — ABNORMAL HIGH (ref 38–126)
Anion gap: 11 (ref 5–15)
BUN: <5 mg/dL — ABNORMAL LOW (ref 6–20)
CHLORIDE: 104 mmol/L (ref 101–111)
CO2: 26 mmol/L (ref 22–32)
Calcium: 9.2 mg/dL (ref 8.9–10.3)
Creatinine, Ser: 0.58 mg/dL — ABNORMAL LOW (ref 0.61–1.24)
Glucose, Bld: 124 mg/dL — ABNORMAL HIGH (ref 65–99)
POTASSIUM: 3.9 mmol/L (ref 3.5–5.1)
SODIUM: 141 mmol/L (ref 135–145)
Total Bilirubin: 0.9 mg/dL (ref 0.3–1.2)
Total Protein: 8.2 g/dL — ABNORMAL HIGH (ref 6.5–8.1)

## 2016-03-10 LAB — URINALYSIS, ROUTINE W REFLEX MICROSCOPIC
BACTERIA UA: NONE SEEN
Bilirubin Urine: NEGATIVE
GLUCOSE, UA: NEGATIVE mg/dL
KETONES UR: NEGATIVE mg/dL
LEUKOCYTES UA: NEGATIVE
Nitrite: NEGATIVE
PROTEIN: NEGATIVE mg/dL
SQUAMOUS EPITHELIAL / LPF: NONE SEEN
Specific Gravity, Urine: 1.002 — ABNORMAL LOW (ref 1.005–1.030)
WBC UA: NONE SEEN WBC/hpf (ref 0–5)
pH: 7 (ref 5.0–8.0)

## 2016-03-10 LAB — LIPASE, BLOOD: LIPASE: 84 U/L — AB (ref 11–51)

## 2016-03-10 LAB — CBC
HEMATOCRIT: 40.6 % (ref 39.0–52.0)
HEMOGLOBIN: 13.9 g/dL (ref 13.0–17.0)
MCH: 32.6 pg (ref 26.0–34.0)
MCHC: 34.2 g/dL (ref 30.0–36.0)
MCV: 95.3 fL (ref 78.0–100.0)
Platelets: 114 10*3/uL — ABNORMAL LOW (ref 150–400)
RBC: 4.26 MIL/uL (ref 4.22–5.81)
RDW: 13.5 % (ref 11.5–15.5)
WBC: 5.5 10*3/uL (ref 4.0–10.5)

## 2016-03-10 NOTE — ED Triage Notes (Signed)
PER EMS: pt sent to triage upon arrival - pt c/o RUQ abdominal pain intermittent for 12 days associated with nausea and vomiting when the pain comes. No vomiting or nausea currently. Pt refused IV and meds from EMS en route. A&Ox4, ambulatory. BP-144/100, HR104, 98% RA, RR-18

## 2016-03-11 ENCOUNTER — Emergency Department (HOSPITAL_COMMUNITY): Payer: Self-pay

## 2016-03-11 ENCOUNTER — Encounter (HOSPITAL_COMMUNITY): Payer: Self-pay | Admitting: Radiology

## 2016-03-11 LAB — POC OCCULT BLOOD, ED: Fecal Occult Bld: NEGATIVE

## 2016-03-11 MED ORDER — PANTOPRAZOLE SODIUM 40 MG PO TBEC
40.0000 mg | DELAYED_RELEASE_TABLET | Freq: Once | ORAL | Status: AC
  Administered 2016-03-11: 40 mg via ORAL
  Filled 2016-03-11: qty 1

## 2016-03-11 MED ORDER — SODIUM CHLORIDE 0.9 % IV SOLN
INTRAVENOUS | Status: DC
  Administered 2016-03-11: 03:00:00 via INTRAVENOUS

## 2016-03-11 MED ORDER — MORPHINE SULFATE (PF) 4 MG/ML IV SOLN
4.0000 mg | Freq: Once | INTRAVENOUS | Status: AC
Start: 2016-03-11 — End: 2016-03-11
  Administered 2016-03-11: 4 mg via INTRAVENOUS
  Filled 2016-03-11: qty 1

## 2016-03-11 MED ORDER — OMEPRAZOLE 40 MG PO CPDR: 40.0000 mg | DELAYED_RELEASE_CAPSULE | Freq: Every day | ORAL | 1 refills | Status: DC

## 2016-03-11 MED ORDER — IOPAMIDOL (ISOVUE-300) INJECTION 61%
INTRAVENOUS | Status: AC
  Administered 2016-03-11: 100 mL
  Filled 2016-03-11: qty 100

## 2016-03-11 MED ORDER — SODIUM CHLORIDE 0.9 % IV BOLUS (SEPSIS)
1000.0000 mL | Freq: Once | INTRAVENOUS | Status: AC
  Administered 2016-03-11: 1000 mL via INTRAVENOUS

## 2016-03-11 MED ORDER — PROMETHAZINE HCL 25 MG PO TABS: 25.0000 mg | ORAL_TABLET | Freq: Four times a day (QID) | ORAL | 0 refills | Status: DC | PRN

## 2016-03-11 MED ORDER — ONDANSETRON HCL 4 MG/2ML IJ SOLN
4.0000 mg | Freq: Once | INTRAMUSCULAR | Status: AC
  Administered 2016-03-11: 4 mg via INTRAVENOUS
  Filled 2016-03-11: qty 2

## 2016-03-11 MED ORDER — VITAMIN B-1 100 MG PO TABS
100.0000 mg | ORAL_TABLET | Freq: Once | ORAL | Status: AC
  Administered 2016-03-11: 100 mg via ORAL
  Filled 2016-03-11: qty 1

## 2016-03-11 NOTE — ED Provider Notes (Addendum)
By signing my name below, I, [name], attest that this documentation has been prepared under the direction and in the presence of Kendalynn Wideman N Mykeisha Dysert, DO. Electronically Signed: [name], ED Scribe. 03/11/16. 12:35 AM.   TIME SEEN: 12:27 AM   CHIEF COMPLAINT:  Chief Complaint  Patient presents with  . Abdominal Pain     HPI:  HPI Comments: Mathew [name] is a 36 y.o. male otherwise healthy who presents to the Emergency Department complaining of moderate epigastric and RUQ abdominal pain for 2 weeks. Pt reports associated nausea, vomiting, subjective fever, melena. Last episode of Dark stools was yesterday. Pt states he has been taking Alka seltzer and Pepto bismol for his symptoms without adequate relief. Pt drinks alcohol daily. No recent travel outside the country. He is not taking any blood thinners. He denies illicit drug use or excessive NSAID use. No h/o abdominal surgeries. NKDA. Pt denies diarrhea, hematochezia.    ROS: See HPI Constitutional: + subjective fever  Eyes: no drainage  ENT: no runny nose   Cardiovascular:  no chest pain  Resp: no SOB  GI: no hematochezia, diarrhea. +abdominal pain, nausea, vomiting, melena  GU: no dysuria Integumentary: no rash  Allergy: no hives  Musculoskeletal: no leg swelling  Neurological: no slurred speech ROS otherwise negative  PAST MEDICAL HISTORY/PAST SURGICAL HISTORY:  History reviewed. No pertinent past medical history.  MEDICATIONS:  Prior to Admission medications   Medication Sig Start Date End Date Taking? Authorizing Provider  folic acid (FOLVITE) 1 MG tablet Take 1 tablet (1 mg total) by mouth daily. 10/16/15   Fayrene Helper, PA-C  thiamine (VITAMIN B-1) 100 MG tablet Take 1 tablet (100 mg total) by mouth daily. 10/16/15   Fayrene Helper, PA-C    ALLERGIES:  No Known Allergies  SOCIAL HISTORY:  Social History  Substance Use Topics  . Smoking status: Never Smoker  . Smokeless tobacco: Never Used  . Alcohol use Yes    FAMILY  HISTORY: No family history on file.  EXAM: BP 123/80 (BP Location: Right Arm)   Pulse 79   Temp 98.2 F (36.8 C) (Oral)   Resp 18   SpO2 100%  CONSTITUTIONAL: Alert and oriented and responds appropriately to questions. Well-appearing; well-nourished HEAD: Normocephalic EYES: Conjunctivae clear, PERRL, EOMI ENT: normal nose; no rhinorrhea; moist mucous membranes NECK: Supple, no meningismus, no nuchal rigidity, no LAD  CARD: RRR; S1 and S2 appreciated; no murmurs, no clicks, no rubs, no gallops RESP: Normal chest excursion without splinting or tachypnea; breath sounds clear and equal bilaterally; no wheezes, no rhonchi, no rales, no hypoxia or respiratory distress, speaking full sentences ABD/GI: Normal bowel sounds; non-distended; soft, no rebound, no guarding, no peritoneal signs, no hepatosplenomegaly. TTP to epigastric and RUQ regions. Negative murphy's sign.  RECTAL:  Normal rectal tone, no gross blood or melena, guaiac negative, no hemorrhoids appreciated, nontender rectal exam, no fecal impaction. Chaperone present throughout entire exam.  Normal brown soft stool in rectal vault. BACK:  The back appears normal and is non-tender to palpation, there is no CVA tenderness EXT: Normal ROM in all joints; non-tender to palpation; no edema; normal capillary refill; no cyanosis, no calf tenderness or swelling    SKIN: Normal color for age and race; warm; no rash NEURO: Moves all extremities equally, sensation to light touch intact diffusely, cranial nerves II through XII intact, normal speech PSYCH: The patient's mood and manner are appropriate. Grooming and personal hygiene are appropriate.  MEDICAL DECISION MAKING: Patient here with  upper abdominal pain, right upper quadrant pain. Does have a history of heavy alcohol use. Family reports subjective fevers, vomiting for the past 2 weeks. Labs show no leukocytosis. He does have elevation of his liver function tests and lipase. He is Hemoccult  negative here. I suspect that his dark-colored stools are from taking Pepto-Bismol. He has not been vomiting any blood, coffee-ground emesis. Will obtain right upper quadrant ultrasound for further evaluation. Will give IV fluids, pain and nausea medicine.  ED PROGRESS: 2:30 AM  Pt's gallbladder is mildly distended but otherwise unremarkable. No obstruction or signs of cholecystitis. He has fatty infiltration of the liver. Still having tenderness in the epigastric area with elevated lipase. We'll obtain CT scan for further evaluation for pancreatitis. Suspect now LFTs, lipase elevated secondary to chronic alcohol use.   3:45 AM  Pt's CT scan shows no acute abnormality. Gallbladder is somewhat distended but otherwise unremarkable. He has heterogeneous appearance of the liver questioning mild venous congestion. Discussed with patient and family were at bedside that this is likely secondary to his alcohol abuse. Have recommended close PCP as well as gastroenterology follow-up. Recommended he stop drinking alcohol. 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 We'll discharge on omeprazole and Phenergan. We'll discharge with outpatient resources for PCP, GI and rehab. Given his history of alcohol abuse, do not feel comfortable discharging him with narcotic pain medication. He has had withdrawal seizure once before.  Discussed with patient and family that he would need outpatient rehabilitation to stop drinking alcohol. Discussed return precautions. He is comfortable with this plan.   At this time, I do not feel there is any life-threatening condition present. I have reviewed and discussed all results (EKG, imaging, lab, urine as appropriate) and exam findings with patient/family. I have reviewed nursing notes and appropriate previous records.  I feel the patient is safe to be discharged home without further emergent workup and can continue workup as an outpatient as needed. Discussed usual and  customary return precautions. Patient/family verbalize understanding and are comfortable with this plan.  Outpatient follow-up has been provided. All questions have been answered.   I personally performed the services described in this documentation, which was scribed in my presence. The recorded information has been reviewed and is accurate.    Layla MawKristen N Kaley Jutras, DO 03/11/16 0345    Layla MawKristen N Breana Litts, DO 03/11/16 [id number]

## 2016-03-11 NOTE — ED Notes (Signed)
Patient transported to [facility] 

## 2016-03-11 NOTE — Discharge Instructions (Addendum)
I recommend you do not take Tylenol, ibuprofen, aspirin, Aleve or goody powders over-the-counter. I recommend close follow-up with a primary care physician and gastroenterologist. I recommend you stop drinking alcohol.

## 2016-03-11 NOTE — ED Notes (Signed)
Pt returned from CT °

## 2016-07-08 ENCOUNTER — Inpatient Hospital Stay (HOSPITAL_COMMUNITY)
Admission: EM | Admit: 2016-07-08 | Discharge: 2016-07-10 | DRG: 896 | Disposition: A | Discharge status: Home / Self Care | Payer: Self-pay | Attending: Internal Medicine | Admitting: Internal Medicine

## 2016-07-08 ENCOUNTER — Emergency Department (HOSPITAL_COMMUNITY): Payer: Self-pay

## 2016-07-08 DIAGNOSIS — F1010 Alcohol abuse, uncomplicated: Secondary | ICD-10-CM

## 2016-07-08 DIAGNOSIS — R4182 Altered mental status, unspecified: Secondary | ICD-10-CM

## 2016-07-08 DIAGNOSIS — D509 Iron deficiency anemia, unspecified: Secondary | ICD-10-CM | POA: Diagnosis present

## 2016-07-08 DIAGNOSIS — F10921 Alcohol use, unspecified with intoxication delirium: Secondary | ICD-10-CM

## 2016-07-08 DIAGNOSIS — G9340 Encephalopathy, unspecified: Secondary | ICD-10-CM | POA: Diagnosis present

## 2016-07-08 DIAGNOSIS — F10939 Alcohol use, unspecified with withdrawal, unspecified: Secondary | ICD-10-CM

## 2016-07-08 DIAGNOSIS — F10221 Alcohol dependence with intoxication delirium: Principal | ICD-10-CM | POA: Diagnosis present

## 2016-07-08 DIAGNOSIS — D649 Anemia, unspecified: Secondary | ICD-10-CM

## 2016-07-08 DIAGNOSIS — R7989 Other specified abnormal findings of blood chemistry: Secondary | ICD-10-CM

## 2016-07-08 DIAGNOSIS — D696 Thrombocytopenia, unspecified: Secondary | ICD-10-CM

## 2016-07-08 DIAGNOSIS — E876 Hypokalemia: Secondary | ICD-10-CM

## 2016-07-08 DIAGNOSIS — R569 Unspecified convulsions: Secondary | ICD-10-CM

## 2016-07-08 DIAGNOSIS — K760 Fatty (change of) liver, not elsewhere classified: Secondary | ICD-10-CM | POA: Diagnosis present

## 2016-07-08 DIAGNOSIS — K761 Chronic passive congestion of liver: Secondary | ICD-10-CM | POA: Diagnosis present

## 2016-07-08 DIAGNOSIS — F10239 Alcohol dependence with withdrawal, unspecified: Secondary | ICD-10-CM

## 2016-07-08 DIAGNOSIS — M5430 Sciatica, unspecified side: Secondary | ICD-10-CM | POA: Diagnosis present

## 2016-07-08 LAB — CBC
HCT: 32.8 % — ABNORMAL LOW (ref 39.0–52.0)
Hemoglobin: 11.8 g/dL — ABNORMAL LOW (ref 13.0–17.0)
MCH: 33.4 pg (ref 26.0–34.0)
MCHC: 36 g/dL (ref 30.0–36.0)
MCV: 92.9 fL (ref 78.0–100.0)
PLATELETS: 62 10*3/uL — AB (ref 150–400)
RBC: 3.53 MIL/uL — ABNORMAL LOW (ref 4.22–5.81)
RDW: 13.7 % (ref 11.5–15.5)
WBC: 5.4 10*3/uL (ref 4.0–10.5)

## 2016-07-08 LAB — COMPREHENSIVE METABOLIC PANEL
ALT: 47 U/L (ref 17–63)
AST: 167 U/L — AB (ref 15–41)
Albumin: 3.6 g/dL (ref 3.5–5.0)
Alkaline Phosphatase: 167 U/L — ABNORMAL HIGH (ref 38–126)
Anion gap: 7 (ref 5–15)
BILIRUBIN TOTAL: 0.6 mg/dL (ref 0.3–1.2)
BUN: 5 mg/dL — AB (ref 6–20)
CO2: 22 mmol/L (ref 22–32)
CREATININE: 0.55 mg/dL — AB (ref 0.61–1.24)
Calcium: 8.4 mg/dL — ABNORMAL LOW (ref 8.9–10.3)
Chloride: 108 mmol/L (ref 101–111)
GFR calc Af Amer: >60 mL/min (ref >60)
Glucose, Bld: 152 mg/dL — ABNORMAL HIGH (ref 65–99)
Potassium: 2.7 mmol/L — CL (ref 3.5–5.1)
Sodium: 137 mmol/L (ref 135–145)
TOTAL PROTEIN: 7 g/dL (ref 6.5–8.1)

## 2016-07-08 LAB — SALICYLATE LEVEL

## 2016-07-08 LAB — ETHANOL: ALCOHOL ETHYL (B): 303 mg/dL — AB (ref <5)

## 2016-07-08 LAB — ACETAMINOPHEN LEVEL

## 2016-07-08 LAB — MAGNESIUM: Magnesium: 1.9 mg/dL (ref 1.7–2.4)

## 2016-07-08 MED ORDER — VITAMIN B-1 100 MG PO TABS
100.0000 mg | ORAL_TABLET | Freq: Every day | ORAL | Status: DC
  Administered 2016-07-10: 100 mg via ORAL
  Filled 2016-07-08: qty 1

## 2016-07-08 MED ORDER — LORAZEPAM 2 MG/ML IJ SOLN: 0.0000 mg | Freq: Two times a day (BID) | INTRAMUSCULAR | Status: DC

## 2016-07-08 MED ORDER — LORAZEPAM 1 MG PO TABS: 0.0000 mg | ORAL_TABLET | Freq: Two times a day (BID) | ORAL | Status: DC

## 2016-07-08 MED ORDER — THIAMINE HCL 100 MG/ML IJ SOLN
100.0000 mg | Freq: Every day | INTRAMUSCULAR | Status: DC
  Administered 2016-07-09: 100 mg via INTRAVENOUS
  Filled 2016-07-08: qty 2

## 2016-07-08 MED ORDER — SODIUM CHLORIDE 0.9 % IV SOLN
1000.0000 mL | INTRAVENOUS | Status: DC
  Administered 2016-07-08: 1000 mL via INTRAVENOUS

## 2016-07-08 MED ORDER — LORAZEPAM 2 MG/ML IJ SOLN
0.0000 mg | Freq: Four times a day (QID) | INTRAMUSCULAR | Status: DC
  Administered 2016-07-08: 2 mg via INTRAVENOUS
  Filled 2016-07-08: qty 1

## 2016-07-08 MED ORDER — LORAZEPAM 1 MG PO TABS
0.0000 mg | ORAL_TABLET | Freq: Four times a day (QID) | ORAL | Status: DC
  Administered 2016-07-09: 1 mg via ORAL
  Filled 2016-07-08: qty 1

## 2016-07-08 MED ORDER — SODIUM CHLORIDE 0.9 % IV BOLUS (SEPSIS)
1000.0000 mL | Freq: Once | INTRAVENOUS | Status: AC
  Administered 2016-07-08: 1000 mL via INTRAVENOUS

## 2016-07-08 NOTE — ED Notes (Signed)
Bed: [facility] Expected date:  Expected time:  Means of arrival:  Comments: 

## 2016-07-08 NOTE — ED Provider Notes (Signed)
[facility]-EMERGENCY DEPT Provider Note   CSN: [id number] Arrival date & time: 07/08/16  2021     History   Chief Complaint Chief Complaint  Patient presents with  . Alcohol Problem    HPI Mathew [name] is a 36 y.o. male with a hx of EtOH abuse presents to the Emergency Department complaining of gradual, persistent, progressively worsening subjective fevers and myalgias onset 6 days ago. Pt reports he has had the shakes and abdominal pain.  Pt denies sick contacts, international travel or tick bites.  He does work outside.  Pt reports associated headache, vomiting and tremors, but denies rash, neck pain or neck stiffness. Pt denies drug usage, especially IVDU.  He reports previous drug abuse about 4 years ago but never IVDU.  Family and pt deny falls.  Pt's wife reports that the patient is confused with persistent hallucinations.  She reports this began 3-4 days ago.  She reports that patient drinks 3-4 large bottles of beer.  She reports that he attempted to stop drinking last weekend.  Pt reports his last drink was around 6pm tonight.  Today he has had three 24 oz containers of beer.  Wife reports he is drinking about 40% less than usual.  She reports that he was trying to stop, but then got sick.  Pt reports he has attempted to stop cold [place] in the past but has had seizures.  His last seizure was 3 mos ago.  She reports the tremors this week were not the same as previous seizures. .    The history is provided by the patient and medical records. The history is limited by a language barrier. A language interpreter was used.  Alcohol Problem  Associated symptoms include headaches. Pertinent negatives include no chest pain, no abdominal pain and no shortness of breath.    No past medical history on file.  Patient Active Problem List   Diagnosis Date Noted  . Alcohol use disorder (HCC) 07/09/2016  . Hypokalemia 07/09/2016  . Anemia 07/09/2016  . Thrombocytopenia (HCC) 07/09/2016    No past  surgical history on file.     Home Medications    Prior to Admission medications   Medication Sig Start Date End Date Taking? Authorizing Provider  folic acid (FOLVITE) 1 MG tablet Take 1 tablet (1 mg total) by mouth daily. Patient not taking: Reported on 07/09/2016 10/16/15   Fayrene Helper, PA-C  omeprazole (PRILOSEC) 40 MG capsule Take 1 capsule (40 mg total) by mouth daily. Patient not taking: Reported on 07/09/2016 03/11/16   Ward, Layla Maw, DO  promethazine (PHENERGAN) 25 MG tablet Take 1 tablet (25 mg total) by mouth every 6 (six) hours as needed for nausea or vomiting. Patient not taking: Reported on 07/09/2016 03/11/16   Ward, Layla Maw, DO  thiamine (VITAMIN B-1) 100 MG tablet Take 1 tablet (100 mg total) by mouth daily. Patient not taking: Reported on 07/09/2016 10/16/15   Fayrene Helper, PA-C    Family History No family history on file.  Social History Social History  Substance Use Topics  . Smoking status: Never Smoker  . Smokeless tobacco: Never Used  . Alcohol use Yes     Allergies   Patient has no known allergies.   Review of Systems Review of Systems  Constitutional: Positive for chills, diaphoresis and fever ( subjective). Negative for appetite change, fatigue and unexpected weight change.  HENT: Negative for mouth sores.   Eyes: Negative for visual disturbance.  Respiratory: Negative for cough, chest tightness,  shortness of breath and wheezing.   Cardiovascular: Negative for chest pain.  Gastrointestinal: Negative for abdominal pain, constipation, diarrhea, nausea and vomiting.  Endocrine: Negative for polydipsia, polyphagia and polyuria.  Genitourinary: Negative for dysuria, frequency, hematuria and urgency.  Musculoskeletal: Positive for myalgias. Negative for back pain and neck stiffness.  Skin: Negative for rash.  Allergic/Immunologic: Negative for immunocompromised state.  Neurological: Positive for headaches. Negative for syncope and light-headedness.    Hematological: Does not bruise/bleed easily.  Psychiatric/Behavioral: Positive for hallucinations. Negative for sleep disturbance. The patient is not nervous/anxious.   All other systems reviewed and are negative.    Physical Exam Updated Vital Signs BP 101/60 (BP Location: Left Arm)   Pulse 80   Temp 98.6 F (37 C) (Oral)   Resp 15   SpO2 97%   Physical Exam  Constitutional: He appears well-developed and well-nourished. No distress.  Awake, alert, nontoxic appearance  HENT:  Head: Normocephalic and atraumatic.  Mouth/Throat: Oropharynx is clear and moist. No oropharyngeal exudate.  Eyes: EOM are normal. Right conjunctiva is injected. Left conjunctiva is injected. No scleral icterus.  Neck: Normal range of motion. Neck supple.  Cardiovascular: Normal rate, regular rhythm and intact distal pulses.   Pulses:      Radial pulses are 2+ on the right side, and 2+ on the left side.  Pulmonary/Chest: Effort normal and breath sounds normal. No respiratory distress. He has no wheezes.  Equal chest expansion  Abdominal: Soft. Bowel sounds are normal. He exhibits no mass. There is no tenderness. There is no rebound and no guarding.  Musculoskeletal: Normal range of motion. He exhibits no edema.  Neurological: He is alert.  Speech is clear and goal oriented Moves extremities without ataxia  Skin: Skin is warm and dry. He is not diaphoretic.  Psychiatric: He has a normal mood and affect. He is actively hallucinating.  Nursing note and vitals reviewed.    ED Treatments / Results  Labs (all labs ordered are listed, but only abnormal results are displayed) Labs Reviewed  COMPREHENSIVE METABOLIC PANEL - Abnormal; Notable for the following:       Result Value   Potassium 2.7 (*)    Glucose, Bld 152 (*)    BUN 5 (*)    Creatinine, Ser 0.55 (*)    Calcium 8.4 (*)    AST 167 (*)    Alkaline Phosphatase 167 (*)    All other components within normal limits  ETHANOL - Abnormal; Notable  for the following:    Alcohol, Ethyl (B) 303 (*)    All other components within normal limits  CBC - Abnormal; Notable for the following:    RBC 3.53 (*)    Hemoglobin 11.8 (*)    HCT 32.8 (*)    Platelets 62 (*)    All other components within normal limits  ACETAMINOPHEN LEVEL - Abnormal; Notable for the following:    Acetaminophen (Tylenol), Serum <10 (*)    All other components within normal limits  AMMONIA - Abnormal; Notable for the following:    Ammonia 49 (*)    All other components within normal limits  SALICYLATE LEVEL  MAGNESIUM  RAPID URINE DRUG SCREEN, HOSP PERFORMED  URINALYSIS, ROUTINE W REFLEX MICROSCOPIC    EKG  EKG Interpretation  Date/Time:  Saturday July 09 2016 00:56:36 EDT Ventricular Rate:  79 PR Interval:    QRS Duration: 122 QT Interval:  420 QTC Calculation: 482 R Axis:   -22 Text Interpretation:  Sinus rhythm Nonspecific intraventricular  conduction delay Borderline T abnormalities, anterior leads No old tracing to compare Confirmed by Dione Booze ([id number]) on 07/09/2016 1:01:24 AM       Radiology Dg Chest 2 View  Result Date: 07/08/2016 CLINICAL DATA:  Alcohol withdrawal symptoms, hallucinations, shaking and disorganized speech. EXAM: CHEST  2 VIEW COMPARISON:  None. FINDINGS: Cardiomediastinal silhouette is normal. No pleural effusions or focal consolidations. Trachea projects midline and there is no pneumothorax. Soft tissue planes and included osseous structures are non-suspicious. IMPRESSION: Normal chest. Electronically Signed   By: Awilda Metro M.D.   On: 07/08/2016 22:52   Ct Head Wo Contrast  Result Date: 07/09/2016 CLINICAL DATA:  Alcohol withdrawal, confusion and hallucination EXAM: CT HEAD WITHOUT CONTRAST TECHNIQUE: Contiguous axial images were obtained from the base of the skull through the vertex without intravenous contrast. COMPARISON:  10/16/2015 FINDINGS: Brain: No evidence of acute infarction, hemorrhage, hydrocephalus,  extra-axial collection or mass lesion/mass effect. Mild atrophy. Vascular: No hyperdense vessels. No unexpected calcification. Small gas bubbles in the cavernous sinus, possibly due to venous line. Skull: Normal. Negative for fracture or focal lesion. Sinuses/Orbits: No acute finding. Other: None IMPRESSION: No CT evidence for acute intracranial abnormality. Electronically Signed   By: Jasmine Pang M.D.   On: 07/09/2016 01:00    Procedures Procedures (including critical care time)  Medications Ordered in ED Medications  sodium chloride 0.9 % bolus 1,000 mL (0 mLs Intravenous Stopped 07/08/16 2326)    Followed by  0.9 %  sodium chloride infusion (1,000 mLs Intravenous New Bag/Given 07/08/16 2343)  LORazepam (ATIVAN) injection 0-4 mg (2 mg Intravenous Given 07/08/16 2343)    Or  LORazepam (ATIVAN) tablet 0-4 mg ( Oral See Alternative 07/08/16 2343)  LORazepam (ATIVAN) injection 0-4 mg (not administered)    Or  LORazepam (ATIVAN) tablet 0-4 mg (not administered)  thiamine (VITAMIN B-1) tablet 100 mg (not administered)    Or  thiamine (B-1) injection 100 mg (not administered)  potassium chloride SA (K-DUR,KLOR-CON) CR tablet 40 mEq (40 mEq Oral Not Given 07/09/16 0046)  potassium chloride 10 mEq in 100 mL IVPB (10 mEq Intravenous New Bag/Given 07/09/16 0045)     Initial Impression / Assessment and Plan / ED Course  I have reviewed the triage vital signs and the nursing notes.  Pertinent labs & imaging results that were available during my care of the patient were reviewed by me and considered in my medical decision making (see chart for details).  Clinical Course as of Jul 10 155  Sat Jul 09, 2016  0156 Discussed with Dr. Montez Morita who will admit.    [HM]    Clinical Course User Index [HM] Malachi Suderman, Boyd Kerbs    Presents with hallucinations, altered mental status and reports of decreased alcohol intake over the last week. Question possible alcohol withdrawal relative to his normal  baseline however ethyl alcohol level is 303. Patient potentially with acute encephalitis versus psychosis. CIWA is 8, Ativan ordered. Patient is not tremulous, diaphoretic or vomiting. Ammonia is elevated to 49. Patient with hypokalemia, repletion begun in the department.  Mild transaminitis but improved from previous visit. CT scan without acute abnormality of the head. Patient is afebrile and without infectious findings on workup. Patient will be admitted for further evaluation and treatment.  Final Clinical Impressions(s) / ED Diagnoses   Final diagnoses:  Alcohol abuse  Alcohol intoxication with delirium (HCC)  Altered mental status, unspecified altered mental status type  Hypokalemia  Increased ammonia level    New Prescriptions New  Prescriptions   No medications on file     Milta Deiters 07/09/16 Brien Few, MD 07/12/16 1315

## 2016-07-08 NOTE — ED Notes (Signed)
Pt has been experiencing ETOH withdrawal symptoms- hallucinations, shaking, disorganized speech, headaches at night when not drinking alcohol. PT regularly drinks 4 large bottles of beer a day. Pt has had a very poor appetite lately, has gone days without eating.

## 2016-07-09 ENCOUNTER — Inpatient Hospital Stay (HOSPITAL_COMMUNITY): Payer: Self-pay

## 2016-07-09 ENCOUNTER — Encounter (HOSPITAL_COMMUNITY): Payer: Self-pay

## 2016-07-09 DIAGNOSIS — D649 Anemia, unspecified: Secondary | ICD-10-CM

## 2016-07-09 DIAGNOSIS — D509 Iron deficiency anemia, unspecified: Secondary | ICD-10-CM

## 2016-07-09 DIAGNOSIS — R569 Unspecified convulsions: Secondary | ICD-10-CM

## 2016-07-09 DIAGNOSIS — F1010 Alcohol abuse, uncomplicated: Secondary | ICD-10-CM

## 2016-07-09 DIAGNOSIS — G9340 Encephalopathy, unspecified: Secondary | ICD-10-CM | POA: Diagnosis present

## 2016-07-09 DIAGNOSIS — F10921 Alcohol use, unspecified with intoxication delirium: Secondary | ICD-10-CM

## 2016-07-09 DIAGNOSIS — E876 Hypokalemia: Secondary | ICD-10-CM

## 2016-07-09 DIAGNOSIS — F10239 Alcohol dependence with withdrawal, unspecified: Secondary | ICD-10-CM

## 2016-07-09 DIAGNOSIS — D696 Thrombocytopenia, unspecified: Secondary | ICD-10-CM

## 2016-07-09 LAB — URINALYSIS, ROUTINE W REFLEX MICROSCOPIC
Bilirubin Urine: NEGATIVE
GLUCOSE, UA: NEGATIVE mg/dL
HGB URINE DIPSTICK: NEGATIVE
Ketones, ur: NEGATIVE mg/dL
LEUKOCYTES UA: NEGATIVE
Nitrite: NEGATIVE
Protein, ur: NEGATIVE mg/dL
SPECIFIC GRAVITY, URINE: 1.01 (ref 1.005–1.030)
pH: 5 (ref 5.0–8.0)

## 2016-07-09 LAB — AMMONIA: AMMONIA: 49 umol/L — AB (ref 9–35)

## 2016-07-09 LAB — COMPREHENSIVE METABOLIC PANEL
ALBUMIN: 3 g/dL — AB (ref 3.5–5.0)
ALK PHOS: 166 U/L — AB (ref 38–126)
ALT: 39 U/L (ref 17–63)
AST: 132 U/L — AB (ref 15–41)
Anion gap: 8 (ref 5–15)
BILIRUBIN TOTAL: 0.6 mg/dL (ref 0.3–1.2)
CALCIUM: 7.7 mg/dL — AB (ref 8.9–10.3)
CO2: 21 mmol/L — ABNORMAL LOW (ref 22–32)
CREATININE: 0.39 mg/dL — AB (ref 0.61–1.24)
Chloride: 110 mmol/L (ref 101–111)
GFR calc Af Amer: >60 mL/min (ref >60)
GFR calc non Af Amer: >60 mL/min (ref >60)
GLUCOSE: 95 mg/dL (ref 65–99)
Potassium: 3.2 mmol/L — ABNORMAL LOW (ref 3.5–5.1)
Sodium: 139 mmol/L (ref 135–145)
TOTAL PROTEIN: 5.8 g/dL — AB (ref 6.5–8.1)

## 2016-07-09 LAB — CBC
HEMATOCRIT: 28.6 % — AB (ref 39.0–52.0)
HEMOGLOBIN: 10 g/dL — AB (ref 13.0–17.0)
MCH: 32.8 pg (ref 26.0–34.0)
MCHC: 35 g/dL (ref 30.0–36.0)
MCV: 93.8 fL (ref 78.0–100.0)
Platelets: 57 10*3/uL — ABNORMAL LOW (ref 150–400)
RBC: 3.05 MIL/uL — ABNORMAL LOW (ref 4.22–5.81)
RDW: 13.9 % (ref 11.5–15.5)
WBC: 4.3 10*3/uL (ref 4.0–10.5)

## 2016-07-09 LAB — IRON AND TIBC
IRON: 21 ug/dL — AB (ref 45–182)
Saturation Ratios: 11 % — ABNORMAL LOW (ref 17.9–39.5)
TIBC: 193 ug/dL — AB (ref 250–450)
UIBC: 172 ug/dL

## 2016-07-09 LAB — RAPID URINE DRUG SCREEN, HOSP PERFORMED
Amphetamines: NOT DETECTED
BENZODIAZEPINES: NOT DETECTED
Barbiturates: NOT DETECTED
COCAINE: NOT DETECTED
OPIATES: NOT DETECTED
Tetrahydrocannabinol: NOT DETECTED

## 2016-07-09 LAB — HIV ANTIBODY (ROUTINE TESTING W REFLEX): HIV SCREEN 4TH GENERATION: NONREACTIVE

## 2016-07-09 LAB — LIPASE, BLOOD: LIPASE: 105 U/L — AB (ref 11–51)

## 2016-07-09 LAB — VITAMIN B12: Vitamin B-12: 806 pg/mL (ref 180–914)

## 2016-07-09 LAB — MRSA PCR SCREENING: MRSA by PCR: NEGATIVE

## 2016-07-09 LAB — PROTIME-INR
INR: 1.27
Prothrombin Time: 15.9 seconds — ABNORMAL HIGH (ref 11.4–15.2)

## 2016-07-09 LAB — APTT: aPTT: 34 seconds (ref 24–36)

## 2016-07-09 LAB — RETICULOCYTES
RBC.: 3.08 MIL/uL — ABNORMAL LOW (ref 4.22–5.81)
Retic Count, Absolute: 30.8 10*3/uL (ref 19.0–186.0)
Retic Ct Pct: 1 % (ref 0.4–3.1)

## 2016-07-09 LAB — FERRITIN: FERRITIN: 54 ng/mL (ref 24–336)

## 2016-07-09 LAB — FOLATE: Folate: 7.7 ng/mL (ref >5.9)

## 2016-07-09 MED ORDER — POTASSIUM CHLORIDE CRYS ER 20 MEQ PO TBCR
40.0000 meq | EXTENDED_RELEASE_TABLET | Freq: Once | ORAL | Status: DC
  Filled 2016-07-09: qty 2

## 2016-07-09 MED ORDER — HEPARIN SODIUM (PORCINE) 5000 UNIT/ML IJ SOLN
5000.0000 [IU] | Freq: Three times a day (TID) | INTRAMUSCULAR | Status: DC
  Administered 2016-07-09 – 2016-07-10 (×3): 5000 [IU] via SUBCUTANEOUS
  Filled 2016-07-09 (×3): qty 1

## 2016-07-09 MED ORDER — ONDANSETRON HCL 4 MG/2ML IJ SOLN: 4.0000 mg | Freq: Four times a day (QID) | INTRAMUSCULAR | Status: DC | PRN

## 2016-07-09 MED ORDER — ACETAMINOPHEN 325 MG PO TABS: 650.0000 mg | ORAL_TABLET | Freq: Four times a day (QID) | ORAL | Status: DC | PRN

## 2016-07-09 MED ORDER — SODIUM CHLORIDE 0.9 % IV SOLN
INTRAVENOUS | Status: DC
  Administered 2016-07-09: 11:00:00 via INTRAVENOUS
  Filled 2016-07-09 (×2): qty 1000

## 2016-07-09 MED ORDER — POTASSIUM CHLORIDE IN NACL 40-0.9 MEQ/L-% IV SOLN
INTRAVENOUS | Status: DC
  Administered 2016-07-09: 100 mL/h via INTRAVENOUS
  Filled 2016-07-09: qty 1000

## 2016-07-09 MED ORDER — ONDANSETRON HCL 4 MG PO TABS: 4.0000 mg | ORAL_TABLET | Freq: Four times a day (QID) | ORAL | Status: DC | PRN

## 2016-07-09 MED ORDER — SODIUM CHLORIDE 0.9% FLUSH
3.0000 mL | Freq: Two times a day (BID) | INTRAVENOUS | Status: DC
  Administered 2016-07-09 – 2016-07-10 (×2): 3 mL via INTRAVENOUS

## 2016-07-09 MED ORDER — ACETAMINOPHEN 650 MG RE SUPP: 650.0000 mg | Freq: Four times a day (QID) | RECTAL | Status: DC | PRN

## 2016-07-09 MED ORDER — FERROUS SULFATE 325 (65 FE) MG PO TABS
325.0000 mg | ORAL_TABLET | Freq: Two times a day (BID) | ORAL | Status: DC
  Administered 2016-07-09 – 2016-07-10 (×2): 325 mg via ORAL
  Filled 2016-07-09 (×2): qty 1

## 2016-07-09 MED ORDER — CHLORDIAZEPOXIDE HCL 10 MG PO CAPS
10.0000 mg | ORAL_CAPSULE | Freq: Three times a day (TID) | ORAL | Status: DC
  Administered 2016-07-09 – 2016-07-10 (×4): 10 mg via ORAL
  Filled 2016-07-09 (×4): qty 1

## 2016-07-09 MED ORDER — POTASSIUM CHLORIDE 10 MEQ/100ML IV SOLN
10.0000 meq | INTRAVENOUS | Status: AC
  Administered 2016-07-09 (×3): 10 meq via INTRAVENOUS
  Filled 2016-07-09 (×2): qty 100

## 2016-07-09 MED ORDER — FAMOTIDINE IN NACL 20-0.9 MG/50ML-% IV SOLN
20.0000 mg | Freq: Two times a day (BID) | INTRAVENOUS | Status: DC
  Administered 2016-07-09 – 2016-07-10 (×4): 20 mg via INTRAVENOUS
  Filled 2016-07-09 (×4): qty 50

## 2016-07-09 NOTE — Progress Notes (Signed)
 @IPLOG @        PROGRESS NOTE                                                                                                                                                                                                             Patient Demographics:    Mathew [name], is a 36 y.o. male, DOB - 1980-07-07, NWG:[id number]  Admit date - 07/08/2016   Admitting Physician Michael LitterNikki Carter, MD  Outpatient Primary MD for the patient is Patient, No Pcp Per  LOS - 0  Chief Complaint  Patient presents with  . Alcohol Problem       Brief Narrative   Mathew [name] is a 36 y.o. gentleman with a history of EtOH dependence and withdrawal seizure (as documented by the ED attending) who presents to the ED for evaluation of hallucinations, tremors, sweats, headache, and nausea, Further workup showed patient was intoxicated with alcohol ingestion with an alcohol level in excess of 300. He was admitted for further care.   [name], No headache, No chest pain, No abdominal pain - No Nausea, No new weakness tingling or numbness, No Cough - SOB.    Assessment  & Plan :     1.Severe alcohol intoxication at risk for going into DTs. Placed on scheduled Librium along with CIWA protocol, continue to monitor, counseled to quit alcohol. Continue hydration with IV fluids.  2. Hypokalemia. Replaced.  3. 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  4. Thrombocytopenia. Likely due to undiagnosed alcoholic liver disease, we'll check a right upper corner ultrasound to rule out cirrhosis.  5. Iron deficiency anemia. Placed on oral iron, monitor. Outpatient age-appropriate workup by PCP.    Diet : Diet NPO time specified    Family Communication  :  NPO  Code Status :  Full  Disposition Plan  :  TBD  Consults  :  None  Procedures  :    CT head. Nonacute.  DVT Prophylaxis  :  Heparin    Lab Results  Component Value Date   PLT 57 (L) 07/09/2016     Inpatient Medications  Scheduled Meds: . chlordiazePOXIDE  10 mg Oral TID  . ferrous sulfate  325 mg Oral BID WC  . LORazepam  0-4 mg Intravenous Q6H   Or  . LORazepam  0-4 mg Oral Q6H  . [START ON 07/11/2016] LORazepam  0-4 mg Intravenous Q12H   Or  . [START ON 07/11/2016] LORazepam  0-4  mg Oral Q12H  . sodium chloride flush  3 mL Intravenous Q12H  . thiamine  100 mg Oral Daily   Or  . thiamine  100 mg Intravenous Daily   Continuous Infusions: . famotidine (PEPCID) IV Stopped (07/09/16 1114)  . 0.9 % sodium chloride with kcl 75 mL/hr at 07/09/16 1042   PRN Meds:.acetaminophen **OR** acetaminophen, ondansetron **OR** ondansetron (ZOFRAN) IV  Antibiotics  :    Anti-infectives    None         Objective:   Vitals:   07/09/16 0800 07/09/16 0900 07/09/16 1000 07/09/16 1100  BP: 112/83  118/77   Pulse: 85 82 70 67  Resp: (!) 21 (!) 21 16 16   Temp: 98 F (36.7 C)     TempSrc: Oral     SpO2: 99% 97% 97% 98%  Weight:      Height:        Wt Readings from Last 3 Encounters:  07/09/16 78.9 kg (173 lb 14.4 oz)     Intake/Output Summary (Last 24 hours) at 07/09/16 1133 Last data filed at 07/09/16 1114  Gross per 24 hour  Intake          2178.33 ml  Output              900 ml  Net          1278.33 ml     Physical Exam  Awake but confused, able to answer basic questions, No F.N deficits,   [place].AT,PERRAL Supple Neck,No JVD, No cervical lymphadenopathy appriciated.  Symmetrical Chest wall movement, Good air movement bilaterally, CTAB RRR,No Gallops,Rubs or new Murmurs, No Parasternal Heave +ve B.Sounds, Abd Soft, No tenderness, No organomegaly appriciated, No rebound - guarding or rigidity. No Cyanosis, Clubbing or edema, No new Rash or bruise       Data Review:    CBC  Recent Labs Lab 07/08/16 2242 07/09/16 0343  WBC 5.4 4.3  HGB 11.8* 10.0*  HCT 32.8* 28.6*  PLT 62* 57*  MCV 92.9 93.8  MCH 33.4 32.8  MCHC 36.0 35.0  RDW 13.7 13.9     Chemistries   Recent Labs Lab 07/08/16 2242 07/09/16 0343  NA 137 139  K 2.7* 3.2*  CL 108 110  CO2 22 21*  GLUCOSE 152* 95  BUN 5* <5*  CREATININE 0.55* 0.39*  CALCIUM 8.4* 7.7*  MG 1.9  --   AST 167* 132*  ALT 47 39  ALKPHOS 167* 166*  BILITOT 0.6 0.6   ------------------------------------------------------------------------------------------------------------------ No results for input(s): CHOL, HDL, LDLCALC, TRIG, CHOLHDL, LDLDIRECT in the last 72 hours.  No results found for: HGBA1C ------------------------------------------------------------------------------------------------------------------ No results for input(s): TSH, T4TOTAL, T3FREE, THYROIDAB in the last 72 hours.  Invalid input(s): FREET3 ------------------------------------------------------------------------------------------------------------------  Recent Labs  07/09/16 0343  VITAMINB12 806  FOLATE 7.7  FERRITIN 54  TIBC 193*  IRON 21*  RETICCTPCT 1.0    Coagulation profile  Recent Labs Lab 07/09/16 0343  INR 1.27    No results for input(s): DDIMER in the last 72 hours.  Cardiac Enzymes No results for input(s): CKMB, TROPONINI, MYOGLOBIN in the last 168 hours.  Invalid input(s): CK ------------------------------------------------------------------------------------------------------------------ No results found for: BNP  Micro Results Recent Results (from the past 240 hour(s))  MRSA PCR Screening     Status: None   Collection Time: 07/09/16  3:27 AM  Result Value Ref Range Status   MRSA by PCR NEGATIVE NEGATIVE Final    Comment:  The GeneXpert MRSA Assay (FDA approved for NASAL specimens only), is one component of a comprehensive MRSA colonization surveillance program. It is not intended to diagnose MRSA infection nor to guide or monitor treatment for MRSA infections.     Radiology Reports Dg Chest 2 View  Result Date: 07/08/2016 CLINICAL DATA:  Alcohol  withdrawal symptoms, hallucinations, shaking and disorganized speech. EXAM: CHEST  2 VIEW COMPARISON:  None. FINDINGS: Cardiomediastinal silhouette is normal. No pleural effusions or focal consolidations. Trachea projects midline and there is no pneumothorax. Soft tissue planes and included osseous structures are non-suspicious. IMPRESSION: Normal chest. Electronically Signed   By: Awilda Metro M.D.   On: 07/08/2016 22:52   Ct Head Wo Contrast  Result Date: 07/09/2016 CLINICAL DATA:  Alcohol withdrawal, confusion and hallucination EXAM: CT HEAD WITHOUT CONTRAST TECHNIQUE: Contiguous axial images were obtained from the base of the skull through the vertex without intravenous contrast. COMPARISON:  10/16/2015 FINDINGS: Brain: No evidence of acute infarction, hemorrhage, hydrocephalus, extra-axial collection or mass lesion/mass effect. Mild atrophy. Vascular: No hyperdense vessels. No unexpected calcification. Small gas bubbles in the cavernous sinus, possibly due to venous line. Skull: Normal. Negative for fracture or focal lesion. Sinuses/Orbits: No acute finding. Other: None IMPRESSION: No CT evidence for acute intracranial abnormality. Electronically Signed   By: Jasmine Pang M.D.   On: 07/09/2016 01:00    Time Spent in minutes  30   Susa Raring M.D on 07/09/2016 at 11:33 AM  Between 7am to 7pm - Pager - [phone] ( page via [email or web address], text pages only, please mention full 10 digit call back number). After 7pm go to [email or web address] - password [facility]

## 2016-07-09 NOTE — H&P (Signed)
History and Physical    Mathew [name] JJO:[id number] DOB: [birth year] DOA: 07/08/2016  PCP: Patient, No Pcp Per   Patient coming from: Home  Chief Complaint: Hallucinations, tremors, sweats  HPI: Mathew [name] is a 36 y.o. gentleman with a history of EtOH dependence and withdrawal seizure (as documented by the ED attending) who presents to the ED for evaluation of hallucinations, tremors, sweats, headache, and nausea, all concerning for EtOH withdrawal.  The patient is Spanish speaking and sedated after receiving '2mg'$  of IV ativan in the ED for a CIWA score of 8.  His significant other, who provided much of the clinical history for the ED, has left.  Thus, this H and P is limited to the documentation provided by the ED attending and chart review at this time.  The patient has intentionally been trying to cut back on his drinking.  At baseline, he drinks several 24 ounce cans of beer daily.  His significant other reported that he had cut back "by 40%" this week.  He has had 3-4 days of symptoms as outlined above.  He actually drank before coming to the ED in an attempt to mask some of his symptoms.  There has not been documented fever associated with the headache.  No known history of cirrhosis.  Fatty liver and hepatic congestion noted on prior abdominal imaging.   ED Course: Clinical picture thought to be consistent with EtOH withdrawal.  CIWA protocol initiated.  He received ativan '2mg'$  IV x one for CIWA score of 8.  LFTs elevated with AST 167, alk phos 167.  Potassium 2.7.  Platelet count 62.  EtOH level 303.  Ammonia level 49.  Chest xray negative for acute process.  Head CT negative for acute process.  He received KCl, 37mq PO and 352m IV as well as IV thiamine and folate.  hospitalist asked to admit.  Review of Systems: Unable to obtain due to mental status changes.  PAST MEDICAL HISTORY: Sciatica Tremor EtOH withdrawal seizure  No past surgical history on file.   reports that he  has never smoked. He has never used smokeless tobacco. He reports that he drinks alcohol. He reports that he does not use drugs.  No Known Allergies  No family history on file.   Prior to Admission medications   Not on File    Physical Exam: Vitals:   07/08/16 2030 07/08/16 2302 07/08/16 2313 07/09/16 0122  BP: 117/75 103/69 103/69 101/60  Pulse: 88 76 90 80  Resp: 18 (!) '2 18 15  '$ Temp: 98.6 F (37 C)     TempSrc: Oral     SpO2: 99%  100% 97%      Constitutional: NAD, obtunded but snoring, protecting airway at this time Vitals:   07/08/16 2030 07/08/16 2302 07/08/16 2313 07/09/16 0122  BP: 117/75 103/69 103/69 101/60  Pulse: 88 76 90 80  Resp: 18 (!) '2 18 15  '$ Temp: 98.6 F (37 C)     TempSrc: Oral     SpO2: 99%  100% 97%   Eyes: pupils are pinpoint, lids and conjunctivae normal ENMT: Mucous membranes are moist. Posterior pharynx cannot be visualized at this time. Neck: normal appearance, supple, no masses Respiratory: clear to auscultation listening anteriorly, no wheezing, no crackles. Normal respiratory effort. No accessory muscle use.  Cardiovascular: Normal rate, regular rhythm, no murmurs / rubs / gallops. No extremity edema. 2+ pedal pulses. GI: abdomen is soft and compressible.  No distention.  Bowel sounds are present. Musculoskeletal:  No joint deformity in upper and lower extremities. Spontaneous movement intermittently.  No contractures. Normal muscle tone.  Skin: no rashes, warm and dry Neurologic: Limited due to mental status changes. Psychiatric: Judgment and insight impaired by mental status.    Labs on Admission: I have personally reviewed following labs and imaging studies  CBC:  Recent Labs Lab 07/08/16 2242  WBC 5.4  HGB 11.8*  HCT 32.8*  MCV 92.9  PLT 62*   Basic Metabolic Panel:  Recent Labs Lab 07/08/16 2242  NA 137  K 2.7*  CL 108  CO2 22  GLUCOSE 152*  BUN 5*  CREATININE 0.55*  CALCIUM 8.4*  MG 1.9   GFR: CrCl  cannot be calculated (Unknown ideal weight.). Liver Function Tests:  Recent Labs Lab 07/08/16 2242  AST 167*  ALT 47  ALKPHOS 167*  BILITOT 0.6  PROT 7.0  ALBUMIN 3.6    Recent Labs Lab 07/09/16 0005  AMMONIA 49*   Urine analysis: Pending  Radiological Exams on Admission: Dg Chest 2 View  Result Date: 07/08/2016 CLINICAL DATA:  Alcohol withdrawal symptoms, hallucinations, shaking and disorganized speech. EXAM: CHEST  2 VIEW COMPARISON:  None. FINDINGS: Cardiomediastinal silhouette is normal. No pleural effusions or focal consolidations. Trachea projects midline and there is no pneumothorax. Soft tissue planes and included osseous structures are non-suspicious. IMPRESSION: Normal chest. Electronically Signed   By: Elon Alas M.D.   On: 07/08/2016 22:52   Ct Head Wo Contrast  Result Date: 07/09/2016 CLINICAL DATA:  Alcohol withdrawal, confusion and hallucination EXAM: CT HEAD WITHOUT CONTRAST TECHNIQUE: Contiguous axial images were obtained from the base of the skull through the vertex without intravenous contrast. COMPARISON:  10/16/2015 FINDINGS: Brain: No evidence of acute infarction, hemorrhage, hydrocephalus, extra-axial collection or mass lesion/mass effect. Mild atrophy. Vascular: No hyperdense vessels. No unexpected calcification. Small gas bubbles in the cavernous sinus, possibly due to venous line. Skull: Normal. Negative for fracture or focal lesion. Sinuses/Orbits: No acute finding. Other: None IMPRESSION: No CT evidence for acute intracranial abnormality. Electronically Signed   By: Donavan Foil M.D.   On: 07/09/2016 01:00    EKG: Independently reviewed. NSR.  No acute ST segment changes.  Assessment/Plan Principal Problem:   Acute encephalopathy Active Problems:   Alcohol use disorder (HCC)   Hypokalemia   Anemia   Thrombocytopenia (HCC)   Seizure due to alcohol withdrawal, with unspecified complication ([place])      Signs and symptoms concerning for  EtOH withdrawal; mental status changes more pronounced after ativan.  Hepatic encephalopathy, acute psychosis, or infectious etiology would also be on the differential.  Hepatic encephalopathy felt to be less likely without known history of cirrhosis though anemia and thrombocytopenia certainly concerning for chronic liver disease. --Continue management with CIWA protocol --Seizure precautions --Fall precautions --NPO until mental status improves --IF fever, will need to consider LP and broad spectrum antibiotics --Hold on lactulose for now; would not be able to give it PO right now --Check HIV and acute hepatitis panel --Check coags --Avoid anticoagulants with thrombocytopenia --IV folate and thiamine now; MVI when tolerating PO --Consider psych referral if he is ready for detox  Hypokalemia --Replacement ordered in the ED --NS with 40 mEq of KCl at 100cc/hr --Repeat BMP in the AM  Anemia and thrombocytopenia --Suspect underlying liver disease based on EtOH history --Check anemia panel   DVT prophylaxis: SCDs (thrombocytopenia) Code Status: FULL Family Communication: Patient alone in the ED at time of admission.  Significant other speaks very little  English and could not give additional history over the phone without interpreter. Disposition Plan: To be determined. Consults called: NONE Admission status: Inpatient, stepdown unit.   TIME SPENT: 40 minutes  Eber Jones MD [facility] Pager [phone]  If 7PM-7AM, please contact night-coverage [email or web address] Password [email or web address]  07/09/2016, 2:20 AM

## 2016-07-10 ENCOUNTER — Encounter (HOSPITAL_COMMUNITY): Payer: Self-pay

## 2016-07-10 LAB — CBC
HCT: 32.4 % — ABNORMAL LOW (ref 39.0–52.0)
HEMOGLOBIN: 11.3 g/dL — AB (ref 13.0–17.0)
MCH: 32.6 pg (ref 26.0–34.0)
MCHC: 34.9 g/dL (ref 30.0–36.0)
MCV: 93.4 fL (ref 78.0–100.0)
PLATELETS: 69 10*3/uL — AB (ref 150–400)
RBC: 3.47 MIL/uL — ABNORMAL LOW (ref 4.22–5.81)
RDW: 13.7 % (ref 11.5–15.5)
WBC: 5.5 10*3/uL (ref 4.0–10.5)

## 2016-07-10 LAB — BASIC METABOLIC PANEL
Anion gap: 6 (ref 5–15)
BUN: 7 mg/dL (ref 6–20)
CALCIUM: 8.3 mg/dL — AB (ref 8.9–10.3)
CHLORIDE: 106 mmol/L (ref 101–111)
CO2: 26 mmol/L (ref 22–32)
CREATININE: 0.51 mg/dL — AB (ref 0.61–1.24)
GFR calc Af Amer: >60 mL/min (ref >60)
GFR calc non Af Amer: >60 mL/min (ref >60)
GLUCOSE: 104 mg/dL — AB (ref 65–99)
Potassium: 3.4 mmol/L — ABNORMAL LOW (ref 3.5–5.1)
Sodium: 138 mmol/L (ref 135–145)

## 2016-07-10 LAB — MAGNESIUM: MAGNESIUM: 1.7 mg/dL (ref 1.7–2.4)

## 2016-07-10 MED ORDER — POTASSIUM PHOSPHATE MONOBASIC 500 MG PO TABS
500.0000 mg | ORAL_TABLET | Freq: Three times a day (TID) | ORAL | Status: DC
  Administered 2016-07-10 (×2): 500 mg via ORAL
  Filled 2016-07-10 (×3): qty 1

## 2016-07-10 MED ORDER — FOLIC ACID 1 MG PO TABS: 1.0000 mg | ORAL_TABLET | Freq: Every day | ORAL | 0 refills | Status: DC

## 2016-07-10 MED ORDER — POTASSIUM CHLORIDE CRYS ER 20 MEQ PO TBCR
40.0000 meq | EXTENDED_RELEASE_TABLET | Freq: Once | ORAL | Status: AC
  Administered 2016-07-10: 40 meq via ORAL
  Filled 2016-07-10: qty 2

## 2016-07-10 MED ORDER — MAGNESIUM SULFATE IN D5W 1-5 GM/100ML-% IV SOLN
1.0000 g | Freq: Once | INTRAVENOUS | Status: AC
  Administered 2016-07-10: 1 g via INTRAVENOUS
  Filled 2016-07-10: qty 100

## 2016-07-10 MED ORDER — SODIUM CHLORIDE 0.9 % IV SOLN
INTRAVENOUS | Status: DC
  Filled 2016-07-10: qty 1000

## 2016-07-10 MED ORDER — THIAMINE HCL 100 MG PO TABS: 100.0000 mg | ORAL_TABLET | Freq: Every day | ORAL | 0 refills | Status: DC

## 2016-07-10 MED ORDER — FERROUS SULFATE 325 (65 FE) MG PO TABS: 325.0000 mg | ORAL_TABLET | Freq: Two times a day (BID) | ORAL | 0 refills | Status: DC

## 2016-07-10 MED ORDER — POTASSIUM CHLORIDE 20 MEQ/15ML (10%) PO SOLN
40.0000 meq | Freq: Once | ORAL | Status: AC
  Administered 2016-07-10: 40 meq via ORAL
  Filled 2016-07-10: qty 30

## 2016-07-10 NOTE — Discharge Instructions (Signed)
Follow with Primary MD  in 7 days  ° °Get CBC, CMP, 2 view Chest X ray checked  by Primary MD or SNF MD in 5-7 days ( we routinely change or add medications that can affect your baseline labs and fluid status, therefore we recommend that you get the mentioned basic workup next visit with your PCP, your PCP may decide not to get them or add new tests based on their clinical decision) ° °Activity: As tolerated with Full fall precautions use walker/cane & assistance as needed ° °Disposition Home   ° °Diet:   Heart Healthy   ° °For Heart failure patients - Check your Weight same time everyday, if you gain over 2 pounds, or you develop in leg swelling, experience more shortness of breath or chest pain, call your Primary MD immediately. Follow Cardiac Low Salt Diet and 1.5 lit/day fluid restriction. ° °On your next visit with your primary care physician please Get Medicines reviewed and adjusted. ° °Please request your Prim.MD to go over all Hospital Tests and Procedure/Radiological results at the follow up, please get all Hospital records sent to your Prim MD by signing hospital release before you go home. ° °If you experience worsening of your admission symptoms, develop shortness of breath, life threatening emergency, suicidal or homicidal thoughts you must seek medical attention immediately by calling 911 or calling your MD immediately  if symptoms less severe. ° °You Must read complete instructions/literature along with all the possible adverse reactions/side effects for all the Medicines you take and that have been prescribed to you. Take any new Medicines after you have completely understood and accpet all the possible adverse reactions/side effects.  ° °Do not drive, operate heavy machinery, perform activities at heights, swimming or participation in water activities or provide baby sitting services if your were admitted for syncope or siezures until you have seen by Primary MD or a Neurologist and advised to do  so again. ° °Do not drive when taking Pain medications.  ° ° °Do not take more than prescribed Pain, Sleep and Anxiety Medications ° °Special Instructions: If you have smoked or chewed Tobacco  in the last 2 yrs please stop smoking, stop any regular Alcohol  and or any Recreational drug use. ° °Wear Seat belts while driving. ° ° °Please note ° °You were cared for by a hospitalist during your hospital stay. If you have any questions about your discharge medications or the care you received while you were in the hospital after you are discharged, you can call the unit and asked to speak with the hospitalist on call if the hospitalist that took care of you is not available. Once you are discharged, your primary care physician will handle any further medical issues. Please note that NO REFILLS for any discharge medications will be authorized once you are discharged, as it is imperative that you return to your primary care physician (or establish a relationship with a primary care physician if you do not have one) for your aftercare needs so that they can reassess your need for medications and monitor your lab values. ° °

## 2016-07-10 NOTE — Discharge Summary (Signed)
Mathew [name] ZHY:[id number] DOB: 06-12-1980 DOA: 07/08/2016  PCP: Patient, No Pcp Per  Admit date: 07/08/2016  Discharge date: 07/10/2016  Admitted From: Home   Disposition:  Home   Recommendations for Outpatient Follow-up:   Follow up with PCP in 1-2 weeks  PCP Please obtain BMP/CBC, 2 view CXR in 1week,  (see Discharge instructions)   PCP Please follow up on the following pending results: None   Home Health: None   Equipment/Devices: None  Consultations: None Discharge Condition: Stable   CODE STATUS: Full   Diet Recommendation:  Heart Healthy    Chief Complaint  Patient presents with  . Alcohol Problem     Brief history of present illness from the day of admission and additional interim summary    Mathew Romanis a 36 y.o.gentleman with a history of EtOH dependence and withdrawal seizure (as documented by the ED attending) who presents to the ED for evaluation of hallucinations, tremors, sweats, headache, and nausea, Further workup showed patient was intoxicated with alcohol ingestion with an alcohol level in excess of 300. He was admitted for further care.                                                                 Hospital Course    1.Severe alcohol intoxication - Treated with supportive care with IV fluids, folic acid thiamine, back to baseline this morning, no tremors or signs of DTs, counseled to quit alcohol, social work consulted to help, requested to follow with free clinic and [place], Will BE provided prescriptions of IT and thiamine upon discharge, patient says that he drinks about 3-4 times every week but when he drinks he doesn't heavily, he seems motivated to quit alcohol.  2. Hypokalemia. Replaced.  3. History of seizures due to alcohol withdrawal - supportive care for now, no signs  of DTs or seizures.  4. Thrombocytopenia. Likely due to alcohol abuse, right upper quadrant ultrasound nonacute, no abdominal pain, symptom-free, PCP to monitor platelet count.  5. Iron deficiency anemia. Placed on oral iron, no need for transfusion, request free clinic to do age-appropriate outpatient and deficiency anemia workup outpatient.    Discharge diagnosis     Principal Problem:   Acute encephalopathy Active Problems:   Alcohol use disorder (HCC)   Hypokalemia   Anemia   Thrombocytopenia (HCC)   Seizure due to alcohol withdrawal, with unspecified complication [facility])    Discharge instructions    Discharge Instructions    Diet - low sodium heart healthy    Complete by:  As directed    Discharge instructions    Complete by:  As directed    Follow with Primary MD  in 7 days   Get CBC, CMP, 2 view Chest X ray checked  by Primary MD or SNF MD in  5-7 days ( we routinely change or add medications that can affect your baseline labs and fluid status, therefore we recommend that you get the mentioned basic workup next visit with your PCP, your PCP may decide not to get them or add new tests based on their clinical decision)  Activity: As tolerated with Full fall precautions use walker/cane & assistance as needed  Disposition Home    Diet:   Heart Healthy    For Heart failure patients - Check your Weight same time everyday, if you gain over 2 pounds, or you develop in leg swelling, experience more shortness of breath or chest pain, call your Primary MD immediately. Follow Cardiac Low Salt Diet and 1.5 lit/day fluid restriction.  On your next visit with your primary care physician please Get Medicines reviewed and adjusted.  Please request your Prim.MD to go over all Hospital Tests and Procedure/Radiological results at the follow up, please get all Hospital records sent to your Prim MD by signing hospital release before you go home.  If you experience worsening of your  admission symptoms, develop shortness of breath, life threatening emergency, suicidal or homicidal thoughts you must seek medical attention immediately by calling 911 or calling your MD immediately  if symptoms less severe.  You Must read complete instructions/literature along with all the possible adverse reactions/side effects for all the Medicines you take and that have been prescribed to you. Take any new Medicines after you have completely understood and accpet all the possible adverse reactions/side effects.   Do not drive, operate heavy machinery, perform activities at heights, swimming or participation in water activities or provide baby sitting services if your were admitted for syncope or siezures until you have seen by Primary MD or a Neurologist and advised to do so again.  Do not drive when taking Pain medications.    Do not take more than prescribed Pain, Sleep and Anxiety Medications  Special Instructions: If you have smoked or chewed Tobacco  in the last 2 yrs please stop smoking, stop any regular Alcohol  and or any Recreational drug use.  Wear Seat belts while driving.   Please note  You were cared for by a hospitalist during your hospital stay. If you have any questions about your discharge medications or the care you received while you were in the hospital after you are discharged, you can call the unit and asked to speak with the hospitalist on call if the hospitalist that took care of you is not available. Once you are discharged, your primary care physician will handle any further medical issues. Please note that NO REFILLS for any discharge medications will be authorized once you are discharged, as it is imperative that you return to your primary care physician (or establish a relationship with a primary care physician if you do not have one) for your aftercare needs so that they can reassess your need for medications and monitor your lab values.   Increase activity slowly     Complete by:  As directed       Discharge Medications   Allergies as of 07/10/2016   No Known Allergies     Medication List    TAKE these medications   ferrous sulfate 325 (65 FE) MG tablet Take 1 tablet (325 mg total) by mouth 2 (two) times daily with a meal.   folic acid 1 MG tablet Commonly known as:  FOLVITE Take 1 tablet (1 mg total) by mouth daily.   thiamine 100 MG  tablet Take 1 tablet (100 mg total) by mouth daily.       Follow-up Information    [facility]. Schedule an appointment as soon as possible for a visit in 1 week(s).   Contact information: [address] [place] [address] [phone]          Major procedures and Radiology Reports - PLEASE review detailed and final reports thoroughly  -         Dg Chest 2 View  Result Date: 07/08/2016 CLINICAL DATA:  Alcohol withdrawal symptoms, hallucinations, shaking and disorganized speech. EXAM: CHEST  2 VIEW COMPARISON:  None. FINDINGS: Cardiomediastinal silhouette is normal. No pleural effusions or focal consolidations. Trachea projects midline and there is no pneumothorax. Soft tissue planes and included osseous structures are non-suspicious. IMPRESSION: Normal chest. Electronically Signed   By: Awilda Metro M.D.   On: 07/08/2016 22:52   Ct Head Wo Contrast  Result Date: 07/09/2016 CLINICAL DATA:  Alcohol withdrawal, confusion and hallucination EXAM: CT HEAD WITHOUT CONTRAST TECHNIQUE: Contiguous axial images were obtained from the base of the skull through the vertex without intravenous contrast. COMPARISON:  10/16/2015 FINDINGS: Brain: No evidence of acute infarction, hemorrhage, hydrocephalus, extra-axial collection or mass lesion/mass effect. Mild atrophy. Vascular: No hyperdense vessels. No unexpected calcification. Small gas bubbles in the cavernous sinus, possibly due to venous line. Skull: Normal. Negative for fracture or focal lesion.  Sinuses/Orbits: No acute finding. Other: None IMPRESSION: No CT evidence for acute intracranial abnormality. Electronically Signed   By: Jasmine Pang M.D.   On: 07/09/2016 01:00   US Abdomen Limited Ruq  Result Date: 07/09/2016 CLINICAL DATA:  Thrombocytopenia. Alcohol use disorder and alcohol withdrawal. EXAM: ULTRASOUND ABDOMEN LIMITED RIGHT UPPER QUADRANT COMPARISON:  Ultrasound and CT on 03/11/2016 FINDINGS: Gallbladder: No gallstones visualized. Gallbladder is nondilated. Mild diffuse gallbladder wall thickening and pericholecystic fluid noted. No sonographic Murphy sign noted by sonographer. Common bile duct: Diameter: 5 mm, within normal limits. Liver: Diffusely increased and heterogeneous parenchymal echogenicity, consistent with diffuse hepatic steatosis/hepatocellular disease. No liver mass identified. Color Doppler shows patent portal vein with normal flow direction. IMPRESSION: No evidence of cholelithiasis or biliary ductal dilatation. Mild diffuse gallbladder wall thickening and pericholecystic fluid, which is nonspecific. No sonographic Murphy sign noted. This may be related to hepatic steatosis/ hepatocellular disease. Consider nuclear medicine hepatobiliary scan for further evaluation if clinically warranted. Electronically Signed   By: Myles Rosenthal M.D.   On: 07/09/2016 13:44    Micro Results     Recent Results (from the past 240 hour(s))  MRSA PCR Screening     Status: None   Collection Time: 07/09/16  3:27 AM  Result Value Ref Range Status   MRSA by PCR NEGATIVE NEGATIVE Final    Comment:        The GeneXpert MRSA Assay (FDA approved for NASAL specimens only), is one component of a comprehensive MRSA colonization surveillance program. It is not intended to diagnose MRSA infection nor to guide or monitor treatment for MRSA infections.     Today   Subjective    Mathew [name] today has no headache,no chest abdominal pain,no new weakness tingling or numbness, feels much  better wants to go home today.    Objective   Blood pressure 117/80, pulse (!) 59, temperature 98.5 F (36.9 C), temperature source Oral, resp. rate 13, height 5\' 10"  (1.778 m), weight 78.9 kg (173 lb 14.4 oz), SpO2 97 %.   Intake/Output Summary (Last  24 hours) at 07/10/16 1053 Last data filed at 07/10/16 0900  Gross per 24 hour  Intake              825 ml  Output             3950 ml  Net            -3125 ml    Exam Awake Alert, Oriented x 3, No new F.N deficits, Normal affect, no tremors, no signs of DTs [place].AT,PERRAL Supple Neck,No JVD, No cervical lymphadenopathy appriciated.  Symmetrical Chest wall movement, Good air movement bilaterally, CTAB RRR,No Gallops,Rubs or new Murmurs, No Parasternal Heave +ve B.Sounds, Abd Soft, Non tender, No organomegaly appriciated, No rebound -guarding or rigidity. No Cyanosis, Clubbing or edema, No new Rash or bruise   Data Review   CBC w Diff: Lab Results  Component Value Date   WBC 5.5 07/10/2016   HGB 11.3 (L) 07/10/2016   HCT 32.4 (L) 07/10/2016   PLT 69 (L) 07/10/2016   LYMPHOPCT 12 10/16/2015   MONOPCT 7 10/16/2015   EOSPCT 3 10/16/2015   BASOPCT 0 10/16/2015    CMP: Lab Results  Component Value Date   NA 138 07/10/2016   K 3.4 (L) 07/10/2016   CL 106 07/10/2016   CO2 26 07/10/2016   BUN 7 07/10/2016   CREATININE 0.51 (L) 07/10/2016   PROT 5.8 (L) 07/09/2016   ALBUMIN 3.0 (L) 07/09/2016   BILITOT 0.6 07/09/2016   ALKPHOS 166 (H) 07/09/2016   AST 132 (H) 07/09/2016   ALT 39 07/09/2016  .   Total Time in preparing paper work, data evaluation and todays exam - 35 minutes  Susa RaringPrashant Singh M.D on 07/10/2016 at 10:53 AM  [facility]   Office  [phone]

## 2016-07-11 LAB — HEPATITIS PANEL, ACUTE
HCV Ab: <0.1 s/co ratio (ref 0.0–0.9)
Hep A IgM: NEGATIVE
Hep B C IgM: NEGATIVE
Hepatitis B Surface Ag: NEGATIVE

## 2016-08-15 ENCOUNTER — Emergency Department (HOSPITAL_COMMUNITY): Payer: Self-pay

## 2016-08-15 ENCOUNTER — Encounter (HOSPITAL_COMMUNITY): Payer: Self-pay | Admitting: Emergency Medicine

## 2016-08-15 DIAGNOSIS — Z5321 Procedure and treatment not carried out due to patient leaving prior to being seen by health care provider: Secondary | ICD-10-CM | POA: Insufficient documentation

## 2016-08-15 NOTE — ED Triage Notes (Addendum)
Pt reports having rib area that started 2 days ago. Pt denies any injury to area or nausea/vomiting.

## 2016-08-16 ENCOUNTER — Emergency Department (HOSPITAL_COMMUNITY)
Admission: EM | Admit: 2016-08-16 | Discharge: 2016-08-16 | Discharge status: Against Medical Advice | Payer: Self-pay | Attending: Emergency Medicine | Admitting: Emergency Medicine

## 2016-08-16 DIAGNOSIS — R52 Pain, unspecified: Secondary | ICD-10-CM

## 2016-08-16 NOTE — ED Notes (Signed)
No answer when called for room 

## 2016-09-06 ENCOUNTER — Emergency Department (HOSPITAL_COMMUNITY)
Admission: EM | Admit: 2016-09-06 | Discharge: 2016-09-07 | Disposition: A | Discharge status: Home / Self Care | Payer: Self-pay | Attending: Emergency Medicine | Admitting: Emergency Medicine

## 2016-09-06 ENCOUNTER — Encounter (HOSPITAL_COMMUNITY): Payer: Self-pay | Admitting: Emergency Medicine

## 2016-09-06 DIAGNOSIS — F1010 Alcohol abuse, uncomplicated: Secondary | ICD-10-CM

## 2016-09-06 DIAGNOSIS — F1020 Alcohol dependence, uncomplicated: Secondary | ICD-10-CM | POA: Insufficient documentation

## 2016-09-06 DIAGNOSIS — D6959 Other secondary thrombocytopenia: Secondary | ICD-10-CM | POA: Insufficient documentation

## 2016-09-06 LAB — COMPREHENSIVE METABOLIC PANEL
ALT: 48 U/L (ref 17–63)
ANION GAP: 10 (ref 5–15)
AST: 158 U/L — ABNORMAL HIGH (ref 15–41)
Albumin: 3.8 g/dL (ref 3.5–5.0)
Alkaline Phosphatase: 136 U/L — ABNORMAL HIGH (ref 38–126)
BUN: <5 mg/dL — ABNORMAL LOW (ref 6–20)
CALCIUM: 8.8 mg/dL — AB (ref 8.9–10.3)
CHLORIDE: 99 mmol/L — AB (ref 101–111)
CO2: 27 mmol/L (ref 22–32)
Creatinine, Ser: 0.59 mg/dL — ABNORMAL LOW (ref 0.61–1.24)
GFR calc non Af Amer: >60 mL/min (ref >60)
Glucose, Bld: 121 mg/dL — ABNORMAL HIGH (ref 65–99)
POTASSIUM: 3.2 mmol/L — AB (ref 3.5–5.1)
SODIUM: 136 mmol/L (ref 135–145)
Total Bilirubin: 1.1 mg/dL (ref 0.3–1.2)
Total Protein: 8.1 g/dL (ref 6.5–8.1)

## 2016-09-06 LAB — CBC
HCT: 35.2 % — ABNORMAL LOW (ref 39.0–52.0)
HEMOGLOBIN: 12.2 g/dL — AB (ref 13.0–17.0)
MCH: 31.9 pg (ref 26.0–34.0)
MCHC: 34.7 g/dL (ref 30.0–36.0)
MCV: 92.1 fL (ref 78.0–100.0)
PLATELETS: 47 10*3/uL — AB (ref 150–400)
RBC: 3.82 MIL/uL — AB (ref 4.22–5.81)
RDW: 13.6 % (ref 11.5–15.5)
WBC: 5 10*3/uL (ref 4.0–10.5)

## 2016-09-06 LAB — ETHANOL: ALCOHOL ETHYL (B): 294 mg/dL — AB (ref <5)

## 2016-09-06 LAB — LIPASE, BLOOD: LIPASE: 74 U/L — AB (ref 11–51)

## 2016-09-06 MED ORDER — LORAZEPAM 1 MG PO TABS
1.0000 mg | ORAL_TABLET | Freq: Once | ORAL | Status: AC
  Administered 2016-09-07: 1 mg via ORAL
  Filled 2016-09-06: qty 1

## 2016-09-06 MED ORDER — OMEPRAZOLE 20 MG PO CPDR: 20.0000 mg | DELAYED_RELEASE_CAPSULE | Freq: Every day | ORAL | 1 refills | Status: DC

## 2016-09-06 MED ORDER — CHLORDIAZEPOXIDE HCL 25 MG PO CAPS
ORAL_CAPSULE | ORAL | 0 refills | Status: DC
Start: 2016-09-06 — End: 2016-10-21

## 2016-09-06 MED ORDER — GI COCKTAIL ~~LOC~~
30.0000 mL | Freq: Once | ORAL | Status: AC
  Administered 2016-09-07: 30 mL via ORAL
  Filled 2016-09-06: qty 30

## 2016-09-06 NOTE — ED Triage Notes (Signed)
Pt drinks alcohol every day, doesn't eat well, last drink today. C/o bruises over body, bleeds from teeth at night, left abdominal pain. No hematochezia, hematemesis.

## 2016-09-06 NOTE — Discharge Instructions (Signed)
Please read and follow all provided instructions.  Your diagnoses today include:  1. Alcohol abuse   2. Thrombocytopenia concurrent with and due to alcoholism [facility])     Tests performed today include:  Blood counts and electrolytes - shows low platelets, normal blood cell count  Liver, pancreas, and kidney function - shows stress due to alcohol  Vital signs. See below for your results today.   Medications prescribed:   Librium - medication to help curb alcohol withdrawal   Omeprazole (Prilosec) - stomach acid reducer  This medication can be found over-the-counter  Take any prescribed medications only as directed.  Home care instructions:  Follow any educational materials contained in this packet.  BE VERY CAREFUL not to take multiple medicines containing Tylenol (also called acetaminophen). Doing so can lead to an overdose which can damage your liver and cause liver failure and possibly death.   Follow-up instructions: Please follow-up with substance abuse referrals tomorrow.  Return instructions:   Please return to the Emergency Department if you experience worsening symptoms.   Return with blood in your stool, chest pain, shortness of breath.  Please return if you have any other emergent concerns.  Additional Information:  Your vital signs today were: BP 123/84 (BP Location: Left Arm)    Pulse 81    Temp 98.5 F (36.9 C) (Oral)    Resp 16    SpO2 99%  If your blood pressure (BP) was elevated above 135/85 this visit, please have this repeated by your doctor within one month. --------------

## 2016-09-06 NOTE — ED Provider Notes (Signed)
[facility]-EMERGENCY DEPT Provider Note   CSN: [id number] Arrival date & time: 09/06/16  1753     History   Chief Complaint Chief Complaint  Patient presents with  . Alcohol Problem    HPI Mathew [name] is a 37 y.o. male.  Patient with history of substance abuse, alcohol abuse presents with complaint of abdominal pain associated with heavy alcohol use. Patient states that he drinks 6-8 40oz beers daily. Last intake today. Patient has had epigastric pain for the past 1 week. This has been associated with nausea but no vomiting. Patient also notes that he has had bleeding from his gums at night and also easy bruising around his abdomen. He denies any trauma. He denies blood in the stool or urine. No fevers, URI symptoms. No chest pain or shortness of breath. No diarrhea or urinary symptoms. No treatments prior to arrival. Patient voices that he would like to quit alcohol. Denies suicidal or homicidal ideation. History obtained degrees of interpreter.      History reviewed. No pertinent past medical history.  Patient Active Problem List   Diagnosis Date Noted  . Alcohol use disorder (HCC) 07/09/2016  . Hypokalemia 07/09/2016  . Anemia 07/09/2016  . Thrombocytopenia (HCC) 07/09/2016  . Acute encephalopathy 07/09/2016  . Seizure due to alcohol withdrawal, with unspecified complication (HCC) 07/09/2016    History reviewed. No pertinent surgical history.     Home Medications    Prior to Admission medications   Medication Sig Start Date End Date Taking? Authorizing Provider  chlordiazePOXIDE (LIBRIUM) 25 MG capsule 50mg  PO TID x 1D, then 25-50mg  PO BID X 1D, then 25-50mg  PO QD X 1D 09/06/16   Renne Crigler, PA-C  ferrous sulfate 325 (65 FE) MG tablet Take 1 tablet (325 mg total) by mouth 2 (two) times daily with a meal. 07/10/16   Leroy Sea, MD  folic acid (FOLVITE) 1 MG tablet Take 1 tablet (1 mg total) by mouth daily. 07/10/16   Leroy Sea, MD  omeprazole (PRILOSEC) 20 MG  capsule Take 1 capsule (20 mg total) by mouth daily. 09/06/16   Renne Crigler, PA-C  thiamine 100 MG tablet Take 1 tablet (100 mg total) by mouth daily. 07/10/16   Leroy Sea, MD    Family History History reviewed. No pertinent family history.  Social History Social History  Substance Use Topics  . Smoking status: Never Smoker  . Smokeless tobacco: Never Used  . Alcohol use Yes     Allergies   Patient has no known allergies.   Review of Systems Review of Systems  Constitutional: Negative for fever.  HENT: Negative for rhinorrhea and sore throat.   Eyes: Negative for redness.  Respiratory: Negative for cough.   Cardiovascular: Negative for chest pain.  Gastrointestinal: Positive for abdominal pain and nausea. Negative for diarrhea and vomiting.  Genitourinary: Negative for dysuria.  Musculoskeletal: Negative for myalgias.  Skin: Negative for rash.  Neurological: Negative for headaches.  Hematological: Bruises/bleeds easily.     Physical Exam Updated Vital Signs BP 123/84 (BP Location: Left Arm)   Pulse 81   Temp 98.5 F (36.9 C) (Oral)   Resp 16   SpO2 99%   Physical Exam  Constitutional: He appears well-developed and well-nourished.  HENT:  Head: Normocephalic and atraumatic.  Mouth/Throat: Oropharynx is clear and moist.  Eyes: Conjunctivae are normal. Right eye exhibits no discharge. Left eye exhibits no discharge.  Neck: Normal range of motion. Neck supple.  Cardiovascular: Normal rate, regular rhythm and  normal heart sounds.   Pulmonary/Chest: Effort normal and breath sounds normal. No respiratory distress. He has no wheezes. He has no rales.  Abdominal: Soft. He exhibits no distension. There is tenderness (minimal epigastric tenderness). There is no rebound and no guarding.  Neurological: He is alert.  Skin: Skin is warm and dry.  Scattered mild bruises over abdomen. No Cullen sign.   Psychiatric: He has a normal mood and affect.  Nursing note and  vitals reviewed.    ED Treatments / Results  Labs (all labs ordered are listed, but only abnormal results are displayed) Labs Reviewed  LIPASE, BLOOD - Abnormal; Notable for the following:       Result Value   Lipase 74 (*)    All other components within normal limits  COMPREHENSIVE METABOLIC PANEL - Abnormal; Notable for the following:    Potassium 3.2 (*)    Chloride 99 (*)    Glucose, Bld 121 (*)    BUN <5 (*)    Creatinine, Ser 0.59 (*)    Calcium 8.8 (*)    AST 158 (*)    Alkaline Phosphatase 136 (*)    All other components within normal limits  CBC - Abnormal; Notable for the following:    RBC 3.82 (*)    Hemoglobin 12.2 (*)    HCT 35.2 (*)    Platelets 47 (*)    All other components within normal limits  ETHANOL - Abnormal; Notable for the following:    Alcohol, Ethyl (B) 294 (*)    All other components within normal limits  URINALYSIS, ROUTINE W REFLEX MICROSCOPIC    Procedures Procedures (including critical care time)  Medications Ordered in ED Medications  LORazepam (ATIVAN) tablet 1 mg (not administered)  gi cocktail (Maalox,Lidocaine,Donnatal) (not administered)     Initial Impression / Assessment and Plan / ED Course  I have reviewed the triage vital signs and the nursing notes.  Pertinent labs & imaging results that were available during my care of the patient were reviewed by me and considered in my medical decision making (see chart for details).     Patient seen and examined.   Vital signs reviewed and are as follows: BP 123/84 (BP Location: Left Arm)   Pulse 81   Temp 98.5 F (36.9 C) (Oral)   Resp 16   SpO2 99%   I reviewed lab work with patient. Discussed that stomach upset and early satiety is likely due to gastritis due to heavy drinking. He may have peptic ulcer disease as well.Discussed that I see stress on his liver and pancreas from heavy drinking. Also discussed that bleeding is likely due to poor platelet function and low  platelets due to heavy drinking. I applauded patient for wanting to achieve sobriety.Discussed that I do not have any current inpatient options for him. However I am happy to provide multiple referrals for both inpatient and outpatient therapy.  In the interim he will be placed on a Librium taper for alcohol withdrawals. And will discharged home with omeprazole.  Encourage patient to return to the emergency department with severe withdrawal symptoms including seizures or hallucinations. The patient was urged to return to the Emergency Department immediately with worsening of current symptoms, worsening abdominal pain, persistent vomiting, blood noted in stools, fever, or any other concerns. The patient verbalized understanding.    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 Patient has a thrombocytopenia at 47. No significant anemia or vital sign abnormalities from the same. Lipase is elevated at 74 however patient does not have any active vomiting. This value is in line with previous. Also elevation of alkaline phosphatase and AST, similar to previous. Alcohol 294. At this point, he does not appear to have significant withdrawals.patient given oral Ativan and GI cocktail here, Librium and omeprazole for home. Patient discharged with referrals for inpatient and outpatient treatment.   New Prescriptions New Prescriptions   CHLORDIAZEPOXIDE (LIBRIUM) 25 MG CAPSULE    50mg  PO TID x 1D, then 25-50mg  PO BID X 1D, then 25-50mg  PO QD X 1D   OMEPRAZOLE (PRILOSEC) 20 MG CAPSULE    Take 1 capsule (20 mg total) by mouth daily.     Renne Crigler, PA-C 09/07/16 0002    Ward, Layla Maw, DO 09/07/16 [phone]

## 2016-09-08 ENCOUNTER — Encounter (HOSPITAL_COMMUNITY): Payer: Self-pay | Admitting: Emergency Medicine

## 2016-09-08 ENCOUNTER — Emergency Department (HOSPITAL_COMMUNITY)
Admission: EM | Admit: 2016-09-08 | Discharge: 2016-09-08 | Disposition: A | Discharge status: Home / Self Care | Payer: Self-pay | Attending: Emergency Medicine | Admitting: Emergency Medicine

## 2016-09-08 DIAGNOSIS — F10230 Alcohol dependence with withdrawal, uncomplicated: Secondary | ICD-10-CM | POA: Insufficient documentation

## 2016-09-08 DIAGNOSIS — F10930 Alcohol use, unspecified with withdrawal, uncomplicated: Secondary | ICD-10-CM

## 2016-09-08 DIAGNOSIS — Z79899 Other long term (current) drug therapy: Secondary | ICD-10-CM | POA: Insufficient documentation

## 2016-09-08 NOTE — ED Provider Notes (Signed)
[facility] Provider Note   CSN: [id number] Arrival date & time: 09/08/16  7829     History   Chief Complaint Chief Complaint  Patient presents with  . detox  . Medication Refill    HPI Mathew [name] is a 36 y.o. male who presents requesting refills on his Librium area chart review shows that he was seen in the emergency room on 09/06/16 requesting detox, and abdominal pain.  He reports that his abdominal pain is resolved, no nausea/vomiting/diarrhea, and that he is not having any symptoms of alcohol withdrawal.  He reports that he has attempted to contact multiple detox/rehabilitation facilities however they are all too expensive for him as he does not have insurance.  He produced his Librium bottle which has 5 pills remaining. He states that he only took 1 pill today.  He wants a refill on librium.    HPI obtained with use of translator.    HPI  History reviewed. No pertinent past medical history.  Patient Active Problem List   Diagnosis Date Noted  . Alcohol use disorder (HCC) 07/09/2016  . Hypokalemia 07/09/2016  . Anemia 07/09/2016  . Thrombocytopenia (HCC) 07/09/2016  . Acute encephalopathy 07/09/2016  . Seizure due to alcohol withdrawal, with unspecified complication (HCC) 07/09/2016    History reviewed. No pertinent surgical history.     Home Medications    Prior to Admission medications   Medication Sig Start Date End Date Taking? Authorizing Provider  chlordiazePOXIDE (LIBRIUM) 25 MG capsule 50mg  PO TID x 1D, then 25-50mg  PO BID X 1D, then 25-50mg  PO QD X 1D 09/06/16   Renne Crigler, PA-C  ferrous sulfate 325 (65 FE) MG tablet Take 1 tablet (325 mg total) by mouth 2 (two) times daily with a meal. Patient not taking: Reported on 09/06/2016 07/10/16   Leroy Sea, MD  folic acid (FOLVITE) 1 MG tablet Take 1 tablet (1 mg total) by mouth daily. Patient not taking: Reported on 09/06/2016 07/10/16   Leroy Sea, MD  omeprazole (PRILOSEC) 20 MG capsule  Take 1 capsule (20 mg total) by mouth daily. 09/06/16   Renne Crigler, PA-C  thiamine 100 MG tablet Take 1 tablet (100 mg total) by mouth daily. Patient not taking: Reported on 09/06/2016 07/10/16   Leroy Sea, MD    Family History No family history on file.  Social History Social History  Substance Use Topics  . Smoking status: Never Smoker  . Smokeless tobacco: Never Used  . Alcohol use Yes     Comment: stopped 09/06/16     Allergies   Patient has no known allergies.   Review of Systems Review of Systems  Constitutional: Negative for chills, fatigue and fever.  Respiratory: Negative for shortness of breath.   Gastrointestinal: Negative for abdominal pain, blood in stool, diarrhea, nausea and vomiting.  Neurological: Negative for seizures, weakness and light-headedness.     Physical Exam Updated Vital Signs BP 134/87 (BP Location: Right Arm)   Pulse 87   Temp 98.5 F (36.9 C) (Oral)   Resp 18   SpO2 98%   Physical Exam  Constitutional: He appears well-developed and well-nourished.  HENT:  Head: Normocephalic and atraumatic.  Cardiovascular: Normal rate and intact distal pulses.   Pulmonary/Chest: Effort normal. No respiratory distress.  Abdominal: Soft. Bowel sounds are normal. He exhibits no distension. There is no tenderness.  Neurological: He is alert.  Skin: Skin is warm and dry. He is not diaphoretic.  Psychiatric: He has a normal mood  and affect. His behavior is normal.  Nursing note and vitals reviewed.    ED Treatments / Results  Labs (all labs ordered are listed, but only abnormal results are displayed) Labs Reviewed - No data to display  EKG  EKG Interpretation None       Radiology No results found.  Procedures Procedures (including critical care time)  Medications Ordered in ED Medications - No data to display   Initial Impression / Assessment and Plan / ED Course  I have reviewed the triage vital signs and the nursing  notes.  Pertinent labs & imaging results that were available during my care of the patient were reviewed by me and considered in my medical decision making (see chart for details).    Mathew [name] presents requesting a refill on his Librium taper. It was explained to him, again, that Librium is a taper to prevent alcohol withdrawal seizures, and not a long-term medication.  He reports that his abdominal pain has fully resolved and that he is not having any alcohol withdrawal related symptoms him and no recent alcohol withdrawal seizures.  Given that he still has 5 Librium pills out of the total 12 prescribed I do not feel that he needs additional pills for his taper. I reviewed with him the instructions for the taper through the interpreter. I provided him with a list of outpatient resources for detox along with the financial assistance resource guide.  Patient was given the option to ask questions, all of which were answered to the best of my abilities. To confirm understanding I had them repeat the instructions regarding his Librium and follow-up back to me through the interpreter.  The patient was given return precautions and states his understanding.   At this time there does not appear to be any evidence of an acute emergency medical condition and the patient appears stable for discharge with appropriate outpatient follow up.Diagnosis was discussed with patient who verbalizes understanding and is agreeable to discharge.   Final Clinical Impressions(s) / ED Diagnoses   Final diagnoses:  Alcohol withdrawal syndrome without complication [facility](HCC)    New Prescriptions Discharge Medication List as of 09/08/2016  9:49 AM       Cristina GongHammond, Ioannis Schuh W, PA-C 09/08/16 1014    Alvira MondaySchlossman, Erin, MD 09/18/16 1456

## 2016-09-08 NOTE — ED Triage Notes (Signed)
Patient states that he was here for detox from alcohol couple days ago and given prescription which is helping with withdrawal symptoms but will be running out today and requesting prescription for more.  Patient also called detox facilities that he was given but they are either requiring insurance, too expensive with patient not currently working or too far from home.  Patient denies any n/v/d states that all stopped two days ago.

## 2016-09-21 DIAGNOSIS — R0789 Other chest pain: Secondary | ICD-10-CM | POA: Insufficient documentation

## 2016-09-22 ENCOUNTER — Emergency Department (HOSPITAL_COMMUNITY)
Admission: EM | Admit: 2016-09-22 | Discharge: 2016-09-22 | Disposition: A | Discharge status: Home / Self Care | Payer: Self-pay | Attending: Emergency Medicine | Admitting: Emergency Medicine

## 2016-09-22 ENCOUNTER — Encounter (HOSPITAL_COMMUNITY): Payer: Self-pay | Admitting: Emergency Medicine

## 2016-09-22 ENCOUNTER — Emergency Department (HOSPITAL_COMMUNITY): Payer: Self-pay

## 2016-09-22 DIAGNOSIS — R0789 Other chest pain: Secondary | ICD-10-CM

## 2016-09-22 LAB — BASIC METABOLIC PANEL
Anion gap: 9 (ref 5–15)
CO2: 24 mmol/L (ref 22–32)
Calcium: 8 mg/dL — ABNORMAL LOW (ref 8.9–10.3)
Chloride: 108 mmol/L (ref 101–111)
Creatinine, Ser: 0.53 mg/dL — ABNORMAL LOW (ref 0.61–1.24)
GFR calc Af Amer: >60 mL/min (ref >60)
GFR calc non Af Amer: >60 mL/min (ref >60)
GLUCOSE: 134 mg/dL — AB (ref 65–99)
Potassium: 3 mmol/L — ABNORMAL LOW (ref 3.5–5.1)
Sodium: 141 mmol/L (ref 135–145)

## 2016-09-22 LAB — POCT I-STAT TROPONIN I
Troponin i, poc: 0 ng/mL (ref 0.00–0.08)
Troponin i, poc: 0.01 ng/mL (ref 0.00–0.08)

## 2016-09-22 LAB — HEPATIC FUNCTION PANEL
ALK PHOS: 168 U/L — AB (ref 38–126)
ALT: 32 U/L (ref 17–63)
AST: 87 U/L — ABNORMAL HIGH (ref 15–41)
Albumin: 3.2 g/dL — ABNORMAL LOW (ref 3.5–5.0)
BILIRUBIN INDIRECT: 0.3 mg/dL (ref 0.3–0.9)
BILIRUBIN TOTAL: 0.5 mg/dL (ref 0.3–1.2)
Bilirubin, Direct: 0.2 mg/dL (ref 0.1–0.5)
Total Protein: 6.6 g/dL (ref 6.5–8.1)

## 2016-09-22 LAB — CBC
HEMATOCRIT: 32.3 % — AB (ref 39.0–52.0)
Hemoglobin: 10.7 g/dL — ABNORMAL LOW (ref 13.0–17.0)
MCH: 31.7 pg (ref 26.0–34.0)
MCHC: 33.1 g/dL (ref 30.0–36.0)
MCV: 95.6 fL (ref 78.0–100.0)
Platelets: 159 10*3/uL (ref 150–400)
RBC: 3.38 MIL/uL — ABNORMAL LOW (ref 4.22–5.81)
RDW: 14.7 % (ref 11.5–15.5)
WBC: 5.5 10*3/uL (ref 4.0–10.5)

## 2016-09-22 LAB — LIPASE, BLOOD: LIPASE: 67 U/L — AB (ref 11–51)

## 2016-09-22 MED ORDER — LIDOCAINE 5 % EX PTCH
1.0000 | MEDICATED_PATCH | CUTANEOUS | Status: DC
  Administered 2016-09-22: 1 via TRANSDERMAL
  Filled 2016-09-22: qty 1

## 2016-09-22 MED ORDER — ASPIRIN 81 MG PO CHEW
324.0000 mg | CHEWABLE_TABLET | Freq: Once | ORAL | Status: AC
  Administered 2016-09-22: 324 mg via ORAL
  Filled 2016-09-22: qty 4

## 2016-09-22 MED ORDER — GI COCKTAIL ~~LOC~~
30.0000 mL | Freq: Once | ORAL | Status: AC
  Administered 2016-09-22: 30 mL via ORAL
  Filled 2016-09-22: qty 30

## 2016-09-22 MED ORDER — LIDOCAINE 5 % EX PTCH: 1.0000 | MEDICATED_PATCH | CUTANEOUS | 0 refills | Status: DC

## 2016-09-22 NOTE — ED Triage Notes (Signed)
Pt states he started having chest pain on Wednesday around lunch time  Pt states it hurts to take a deep breath  Pt states it is like a tightness

## 2016-09-22 NOTE — ED Provider Notes (Signed)
[facility] DEPT Provider Note   CSN: [id number] Arrival date & time: 09/21/16  1934    History   Chief Complaint Chief Complaint  Patient presents with  . Chest Pain    HPI Mathew [name] is a 36 y.o. male.  36 year old male with a history of alcohol use disorder, hypokalemia, thrombocytopenia, and seizures secondary to alcohol withdrawal presents to the emergency department for chest pain. Patient states that symptoms began yesterday around noon. They have been constant and pain is exacerbated with deep breathing. Patient describes the pain as a tightness. He denies taking any medications for his symptoms. He has not had any shortness of breath, nausea, vomiting, abdominal pain. He denies a history of ACS and further denies tobacco use. No known associated fevers.   The history is provided by the patient. No language interpreter was used.  Chest Pain      History reviewed. No pertinent past medical history.  Patient Active Problem List   Diagnosis Date Noted  . Alcohol use disorder (HCC) 07/09/2016  . Hypokalemia 07/09/2016  . Anemia 07/09/2016  . Thrombocytopenia (HCC) 07/09/2016  . Acute encephalopathy 07/09/2016  . Seizure due to alcohol withdrawal, with unspecified complication (HCC) 07/09/2016    History reviewed. No pertinent surgical history.     Home Medications    Prior to Admission medications   Medication Sig Start Date End Date Taking? Authorizing Provider  omeprazole (PRILOSEC) 20 MG capsule Take 1 capsule (20 mg total) by mouth daily. 09/06/16  Yes Renne CriglerGeiple, Joshua, PA-C  chlordiazePOXIDE (LIBRIUM) 25 MG capsule 50mg  PO TID x 1D, then 25-50mg  PO BID X 1D, then 25-50mg  PO QD X 1D 09/06/16   Renne CriglerGeiple, Joshua, PA-C  ferrous sulfate 325 (65 FE) MG tablet Take 1 tablet (325 mg total) by mouth 2 (two) times daily with a meal. Patient not taking: Reported on 09/06/2016 07/10/16   Leroy SeaSingh, Prashant K, MD  folic acid (FOLVITE) 1 MG tablet Take 1 tablet (1 mg total) by  mouth daily. Patient not taking: Reported on 09/06/2016 07/10/16   Leroy SeaSingh, Prashant K, MD  lidocaine (LIDODERM) 5 % Place 1 patch onto the skin daily. Remove & Discard patch within 12 hours or as directed by MD 09/22/16   Antony MaduraHumes, Nicholaos Schippers, PA-C  thiamine 100 MG tablet Take 1 tablet (100 mg total) by mouth daily. Patient not taking: Reported on 09/06/2016 07/10/16   Leroy SeaSingh, Prashant K, MD    Family History History reviewed. No pertinent family history.  Social History Social History  Substance Use Topics  . Smoking status: Never Smoker  . Smokeless tobacco: Never Used  . Alcohol use Yes     Comment: daily      Allergies   Patient has no known allergies.   Review of Systems Review of Systems  Cardiovascular: Positive for chest pain.  Ten systems reviewed and are negative for acute change, except as noted in the HPI.    Physical Exam Updated Vital Signs BP (!) 86/50   Pulse 65   Temp 98.2 F (36.8 C) (Oral)   Resp 18   SpO2 98%   Physical Exam  Constitutional: He is oriented to person, place, and time. He appears well-developed and well-nourished. No distress.  Nontoxic appearing in no acute distress  HENT:  Head: Normocephalic and atraumatic.  Eyes: Conjunctivae and EOM are normal. No scleral icterus.  Neck: Normal range of motion.  Cardiovascular: Normal rate, regular rhythm and intact distal pulses.   Pulmonary/Chest: Effort normal. No respiratory distress. He  has no wheezes. He has no rales. He exhibits tenderness.  Tenderness to palpation over the left pectoral muscle. Chest expansion symmetric. Lungs clear to auscultation bilaterally.  Abdominal: Soft.  Minimal left upper quadrant tenderness. Abdomen otherwise soft and nontender. No peritoneal signs.  Musculoskeletal: Normal range of motion.  Neurological: He is alert and oriented to person, place, and time. He exhibits normal muscle tone. Coordination normal.  Skin: Skin is warm and dry. No rash noted. He is not  diaphoretic. No erythema. No pallor.  Psychiatric: He has a normal mood and affect. His behavior is normal.  Nursing note and vitals reviewed.    ED Treatments / Results  Labs (all labs ordered are listed, but only abnormal results are displayed) Labs Reviewed  BASIC METABOLIC PANEL - Abnormal; Notable for the following:       Result Value   Potassium 3.0 (*)    Glucose, Bld 134 (*)    BUN <5 (*)    Creatinine, Ser 0.53 (*)    Calcium 8.0 (*)    All other components within normal limits  CBC - Abnormal; Notable for the following:    RBC 3.38 (*)    Hemoglobin 10.7 (*)    HCT 32.3 (*)    All other components within normal limits  HEPATIC FUNCTION PANEL - Abnormal; Notable for the following:    Albumin 3.2 (*)    AST 87 (*)    Alkaline Phosphatase 168 (*)    All other components within normal limits  LIPASE, BLOOD - Abnormal; Notable for the following:    Lipase 67 (*)    All other components within normal limits  POCT I-STAT TROPONIN I  POCT I-STAT TROPONIN I    EKG ED ECG REPORT   Date: 09/22/2016  Rate: 82  Rhythm: normal sinus rhythm  QRS Axis: left (borderline)  Intervals: normal  ST/T Wave abnormalities: nonspecific T wave changes  Conduction Disutrbances:none  Narrative Interpretation:   Old EKG Reviewed: unchanged  I have personally reviewed the EKG tracing and agree with the computerized printout as noted.  5:00 AM EKG reviewed; largely unchanged from prior on my interpretation.   Radiology Dg Chest 2 View  Result Date: 09/22/2016 CLINICAL DATA:  36 y/o  M; chest pain and tightness for 48 hours. EXAM: CHEST  2 VIEW COMPARISON:  08/15/2016 chest radiograph FINDINGS: Low lung volumes accentuate pulmonary markings. Stable normal cardiac silhouette given projection and technique. No focal consolidation. No pleural effusion or pneumothorax. Bones are unremarkable. IMPRESSION: Low lung volumes.  No acute pulmonary process identified. Electronically Signed    By: Mitzi Hansen M.D.   On: 09/22/2016 01:05    Procedures Procedures (including critical care time)  Medications Ordered in ED Medications  lidocaine (LIDODERM) 5 % 1 patch (1 patch Transdermal Patch Applied 09/22/16 0238)  gi cocktail (Maalox,Lidocaine,Donnatal) (30 mLs Oral Given 09/22/16 0238)  aspirin chewable tablet 324 mg (324 mg Oral Given 09/22/16 0238)     Initial Impression / Assessment and Plan / ED Course  I have reviewed the triage vital signs and the nursing notes.  Pertinent labs & imaging results that were available during my care of the patient were reviewed by me and considered in my medical decision making (see chart for details).     36 year old male presents to the emergency department for evaluation of chest pain. He reports constant pain since noon yesterday. It is worse with deep breathing. Pain also reproducible on palpation of the chest wall. Lungs  grossly clear and cardiac workup is reassuring. Heart score is 2 c/w low risk of ACS. Chest x-ray without mediastinal widening. Symptoms atypical for dissection. Low suspicion for this. Further doubt pulmonary embolus. Patient is PERC negative.  Patient has had improvement in his pain with a Lidoderm patch. Given reproducibility on palpation, will continue with supportive management for muscle strain. Return precautions discussed and provided. Patient discharged in stable condition with no unaddressed concerns.   Final Clinical Impressions(s) / ED Diagnoses   Final diagnoses:  Chest wall pain    New Prescriptions New Prescriptions   LIDOCAINE (LIDODERM) 5 %    Place 1 patch onto the skin daily. Remove & Discard patch within 12 hours or as directed by MD     Antony Madura, PA-C 09/22/16 0506    Dione Booze, MD 09/22/16 [phone]

## 2016-09-27 ENCOUNTER — Ambulatory Visit (INDEPENDENT_AMBULATORY_CARE_PROVIDER_SITE_OTHER): Payer: Self-pay | Admitting: Physician Assistant

## 2016-10-21 ENCOUNTER — Encounter (INDEPENDENT_AMBULATORY_CARE_PROVIDER_SITE_OTHER): Payer: Self-pay | Admitting: Physician Assistant

## 2016-10-21 ENCOUNTER — Ambulatory Visit (INDEPENDENT_AMBULATORY_CARE_PROVIDER_SITE_OTHER): Payer: Self-pay | Admitting: Physician Assistant

## 2016-10-21 VITALS — BP 124/75 | HR 83 | Temp 98.7°F | Wt 154.2 lb

## 2016-10-21 DIAGNOSIS — Z114 Encounter for screening for human immunodeficiency virus [HIV]: Secondary | ICD-10-CM

## 2016-10-21 DIAGNOSIS — T148XXA Other injury of unspecified body region, initial encounter: Secondary | ICD-10-CM

## 2016-10-21 DIAGNOSIS — Z23 Encounter for immunization: Secondary | ICD-10-CM

## 2016-10-21 DIAGNOSIS — F1020 Alcohol dependence, uncomplicated: Secondary | ICD-10-CM

## 2016-10-21 DIAGNOSIS — K130 Diseases of lips: Secondary | ICD-10-CM

## 2016-10-21 MED ORDER — ACAMPROSATE CALCIUM 333 MG PO TBEC: 666.0000 mg | DELAYED_RELEASE_TABLET | Freq: Three times a day (TID) | ORAL | 0 refills | Status: DC

## 2016-10-21 MED ORDER — LORAZEPAM 2 MG PO TABS: 2.0000 mg | ORAL_TABLET | Freq: Four times a day (QID) | ORAL | 0 refills | Status: DC | PRN

## 2016-10-21 MED ORDER — THIAMINE HCL 100 MG PO TABS: 100.0000 mg | ORAL_TABLET | Freq: Three times a day (TID) | ORAL | 1 refills | Status: DC

## 2016-10-21 MED ORDER — LORAZEPAM 2 MG PO TABS: ORAL_TABLET | ORAL | 0 refills | Status: DC

## 2016-10-21 NOTE — Patient Instructions (Addendum)
Community Resources  Advocacy/Legal Legal Aid [place]:  1-866-219-5262  /  336-272-0148  Family Justice Center:  336-641-7233  [facility] 24-hr Crisis line:  336-273-7273  Women's Resource Center, [place]:  336-275-6090  Court Watch (custody):  336-275-2346  [facility]:   336-279-9299    Baby & Breastfeeding Car Seat Inspection @ Various [place] Fire Depts.- call 336-373-2177  [facility]  336-832-6860  [facility] 336-878-6712  WIC: 336-641-3663 ([place]);  336-641-7571 ([place])  La Leche League:  1-877-452-5321   Childcare [place] [facility]: 336-369-5097 ([place]) / 336-887-8224 ([place])  - Child Care Resources/ Referrals/ Scholarships  - Head Start/ Early Head Start (call or apply online)  [place] DHHS: [place] Pre-K :  1-800-859-0829 / 336-274-5437   Employment / Job Search [facility] [place]: 336-275-6090 / [address]  [place] [facility] [employer] [facility] (JobLink): 336-373-5922 ([place]) / 336-882-4141 ([place])  [facility]/ Career Center: 336-275-9801 / 336-282-7307  [facility] Job & Career Center: 336-373-3764  DHHS Work First: 336-641-3447 ([place]) / 336-641-3447 ([place])  [facility] [place]:  336-676-5871   Financial Assistance [facility]:  336-553-2657  [facility]: 336-235-0368  [employer] (furniture):  336-370-4002  [place] [facility]: 336-373-4264  Low Income Energy Assistance  336-641-3000   Food Assistance DHHS- SNAP/ Food Stamps: 336-641-4588  WIC: [place]- 336-641-3663 ;  [place] 336-641-7571  Little Green Book- Free Meals  Little Blue Book- Free Food Pantries  During the summer, text "FOOD" to 877877   General Health / Clinics (Adults) Orange Card (for Adults) through [facility]: (336) 895-4900  [facility]:   336-832-8035  [facility]:   336-832-4444  Health Department:  336-641-3245  [facility]:  336-415-3877 / 336-641-2100  [facility] [place]:   336-373-0678  [facility]:   336-334-4822 x 50251   Housing [place] [facility]:   336-691-9521  [place] Housing Authority:  336-275-8501  Affordable Housing Managemnt:  336-273-0568   Immigrant/ Refugee Center for [place]ians (UNCG):  336-256-1065  [facility]:  336-379-0037  [facility]:  336-937-4701  Church World Services:  336-617-0381  African Services Coalition:  336-574-2677   LGBTQ YouthSAFE  www.youthsafe[place].org  PFLAG  336-541-6754 / info@[place].org  The [place] Project:  1-866-488-7386   Mental Health/ Substance Use [facility]  336-387-6161  [facility]:  336-832-9700 or 1-800-711-2635  [facility]:  336-271-5888  [facility]:  336-294-1349  [facility]:  336-542-2884  [facility] (walk-ins)  336-676-6840 / [address]  [place]:  800-449-1287  Alcoholics Anonymous:  336-854-4278  Narcotics Anonymous:  800-365-1036  Quit Smoking Hotline:  800-QUIT-NOW (800-784-8669)   Parenting Children's Home Society:  800-632-1400  [place]: Education Center & Support Groups:  336-832-6682  YWCA: 336-273-3461  UNCG: Bringing Out the Best:  336-334-3120               Thriving at Three (Hispanic families): 336-256-1066  Healthy Start ([facility]):  336-387-6161 x2288  Parents as Teachers:  336-691-0024  [place] [facility]- Learning Together (Immigrants): 336-369-5001   Poison Control 800-222-1222  Sports & Recreation [employer] Open Doors Application: ymcanwnc.org/join/open-doors-financial-assistance/  City of [place] Recreation Centers: http://www.[place].gov/index.aspx?page=3615   Special Needs Family Support Network:  336-832-6507  Autism Society of :   336-333-0197 x1402 or x1412 /  800-785-1035  [facility] [place]:  336-334-5773     ARC of [place]:  (559) 235-2777  Children's Developmental Service Agency (CDSA):  5814444269  [facility] (Care Coordination for Children):  639-179-7506   Transportation Medicaid Transportation: 225-839-7047 to apply  [place] Authority: ([phone] [phone] (reduced-fare bus ID to Medicaid/ Medicare/ Orange Card)  SCAT Paratransit services: Eligible riders only, call 308-715-8117 for application   Tutoring/Mentoring [facility] Child Development Institute: 954-432-7307  [facility] Sisters: [phone])665-6003 ([place])  ACES through child's school: 534-002-3927  [employer] Achievers: contact your local [place] Dys Mentor Program: 865-844-8213      Intoxicacin alcohlica Alcohol Intoxication La intoxicacin alcohlica ocurre cuando una persona ya no piensa con claridad ni se desempea bien (experimenta un deterioro) despus de beber bebidas alcohlicas. La intoxicacin alcohlica puede ocurrir incluso con una copa. El nivel de deterioro depende de lo siguiente:  La cantidad de alcohol que la persona tom.  La edad, el sexo y el peso de Dealer.  La frecuencia con la que la persona bebe.  Si la persona tiene Administrator, sports, tales como diabetes, convulsiones o una afeccin cardaca.  La intoxicacin alcohlica puede variar en gravedad, desde leve hasta grave. La afeccin puede ser peligrosa, especialmente cuando se beben grandes cantidades de alcohol en un corto perodo de tiempo (borrachera) o si la persona tambin tom medicamentos recetados o consumi drogas recreativas. Cules son los signos o los sntomas? Los sntomas de intoxicacin alcohlica leve incluyen los siguientes:  Sensacin de relajacin o somnolencia.  Leve dificultad con: ? La coordinacin. ? El habla. ? Guardian Life Insurance. ? La atencin.  Los sntomas de intoxicacin alcohlica moderada incluyen los siguientes:  Emociones extremas, como enojo o tristeza.  Dificultad moderada con: ? La  coordinacin. ? El habla. ? Guardian Life Insurance. ? La atencin.  Los sntomas de intoxicacin alcohlica grave incluyen los siguientes:  Desmayo.  Vmitos.  Confusin.  Respiracin lenta.  Coma.  Dificultad grave con: ? La coordinacin. ? El habla. ? [employer] Life Insurance. ? La atencin.  La intoxicacin alcohlica, especialmente en personas que no estn expuestas al alcohol, con frecuencia puede avanzar de leve a grave rpidamente y [place] puede causar coma o la muerte. Cmo se diagnostica? Esta afeccin se puede diagnosticar en funcin de lo siguiente:  Los antecedentes mdicos.  Un examen fsico.  Un anlisis de sangre que mide la concentracin de alcohol en la sangre (blood alcohol content, BAC).  Si hay olor a alcohol en la respiracin.  Su mdico le preguntar la cantidad y el tipo de bebida alcohlica que bebi. Cmo se trata? Generalmente, no se requiere un tratamiento para esta afeccin. La mayora de los efectos del alcohol son temporales y desaparecen a medida que el alcohol deja el cuerpo de forma natural. El mdico puede recomendar un monitoreo hasta que el nivel de alcohol comience a Publishing copy y sea Primary school teacher a casa. Tambin es posible que le administren lquidos a travs de una va intravenosa (IV) para ayudar a Statistician. Si la intoxicacin alcohlica es grave, es posible que se necesite una mquina para respirar denominada "ventilador" para ayudarle a Industrial/product designer. Siga estas instrucciones en su casa:  No conduzca vehculos despus de beber alcohol.  Pdale a alguna persona que se quede con usted mientras est [place]. No debe quedarse solo.  Mantngase hidratado. Beba suficiente lquido para Photographer orina clara o de color amarillo plido.  Evite la cafena ya que puede deshidratarlo.  Tome los medicamentos de venta libre y los recetados solamente como se lo haya indicado el mdico. Cmo se  evita? Para evitar la intoxicacin alcohlica:  Limite el  consumo de alcohol a no ms de 1 medida por da si es mujer y no est Orthoptist y a 2 medidas por da si es hombre. Una medida equivale a 12onzas de cerveza, 5onzas de vino o 1onzas de bebidas alcohlicas de alta graduacin.  No beba alcohol con el estmago vaco.  Evite el consumo de alcohol si: ? No tiene la edad legal para beber. ? Est embarazada o podra estarlo. ? Est tomando medicamentos que no se deben tomar con alcohol. ? Beber empeora su enfermedad. ? Tiene que conducir o Education officer, environmental actividades que requieren su atencin. ? Tiene un trastorno por abuso de sustancias.  Para evitar las complicaciones posiblemente graves de la intoxicacin Therapist, sports, busque atencin mdica de inmediato si usted o una persona que conoce tiene signos de intoxicacin alcohlica moderada o grave. Estos incluyen los siguientes:  Dificultad moderada o grave con: ? La coordinacin. ? El habla. ? Guardian Life Insurance. ? La atencin.  Desmayo.  Confusin.  Vmitos.  No deje a una persona sola si est embriagada. Comunquese con un mdico si:  No mejora luego de [employer].  Tiene problemas en el trabajo, la escuela o en su casa debido al consumo de alcohol. Solicite ayuda de inmediato si:  Se siente inestable cuando intenta abandonar el consumo de alcohol.  Comienza a temblar de manera incontrolable (tiene convulsiones).  Vomita sangre. La sangre en el vmito puede ser de color rojo brillante o similar a los granos de caf.  Maxie Better en la materia fecal. La sangre en la materia fecal puede ser de color rojo brillante o hacer que la materia fecal se vea de color negro alquitranado y huela mal.  Se siente mareado o se desmaya. Si alguna vez siente que puede lastimarse o Physicist, medical a los dems, o tiene pensamientos de poner fin a su vida, busque ayuda de inmediato. Puede dirigirse al servicio de urgencias ms cercano o comunicarse con:  El servicio de Sports administrator de su localidad (911 en los  Estados Unidos).  Una lnea de asistencia al suicida y Visual merchandiser en crisis, como la [employer] de Prevencin del Suicidio (National Suicide Prevention Lifeline) al 606-135-6487. Est disponible las 24 horas del da.  Esta informacin no tiene Theme park manager el consejo del mdico. Asegrese de hacerle al mdico cualquier pregunta que tenga. Document Released: 12/27/2004 Document Revised: 11/23/2015 Document Reviewed: 06/15/2015 Elsevier Interactive Patient Education  2018 [employer].  Delirium tremens (Delirium Tremens) El delirium tremens, tambin conocido como DTs, es la forma ms grave de la abstinencia Therapist, sports. La abstinencia de alcohol implica un conjunto de sntomas. Ocurre cuando una persona deja de beber o reduce la cantidad de bebe. El delirium tremens es grave, y el paciente debe recibir tratamiento en el hospital. CUIDADOS EN EL HOGAR  No beba alcohol.  Tome los medicamentos de venta libre y los recetados solamente como se lo haya indicado el mdico.  No conduzca ni utilice maquinaria pesada hasta que el mdico lo autorice.  Beba suficiente lquido para mantener el pis (orina) claro o de color amarillo plido.  Pdale a alguna persona que se quede con usted o que est disponible por si necesita ayuda.  Considere la posibilidad de unirse a un grupo de apoyo por alcoholismo.  Concurra a todas las visitas de control como se lo haya indicado el mdico. Esto es importante. SOLICITE AYUDA SI:  Tiene sntomas que empeoran o no desaparecen.  No puede comer ni  beber sin vomitar.  Est teniendo mucha dificultad para dejar de beber alcohol.  No puede dejar de beber alcohol. SOLICITE AYUDA DE INMEDIATO SI:  Tiembla y se sacude de manera incontrolable (tiene convulsiones).  Tiene fiebre junto con cualquier otro signo de abstinencia de alcohol.  Est muy confundido.  Siente o ve cosas que otras personas no sienten ni ven (tiene alucinaciones).  Est muy  somnoliento.  Vomita sangre.  Se sacude o tiembla mucho (tiene temblores).  Wendall Stade.  Siente que el [employer] late fuera de ritmo o muy rpidamente (tiene palpitaciones).  Siente dolor en el pecho.  Tiene dificultad para respirar. Estos sntomas pueden Customer service manager. No espere hasta que los sntomas desaparezcan. Solicite atencin mdica de inmediato. Comunquese con el servicio de emergencias de su localidad (911 en los [place]). No conduzca por sus propios medios [employer]. Esta informacin no tiene Theme park manager el consejo del mdico. Asegrese de hacerle al mdico cualquier pregunta que tenga. Document Released: 05/31/2010 Document Revised: 04/20/2015 Document Reviewed: 06/18/2014 Elsevier Interactive Patient Education  2018 [employer].

## 2016-10-21 NOTE — Progress Notes (Signed)
Subjective:  Patient ID: Mathew [name], male    DOB: [birth year]  Age: 36 y.o. MRN: [id number]  CC: alcoholism  HPI Mathew [name] is a 36 y.o. male with a medical history of alcohol abuse presents to seek help with alcoholism. Has tried to quit drinking alcohol on his own but has had DTs and seizures that have led him to hospitalization. Last drink yesterday, "one beer", but friend whom is present during interview states he drank more than that. Has never been to counseling groups.    Also has a lower lip sore that has been present for one month. Bleeds easily. Does not recall trauma to the lip. Scab seems to be easily disturbed which causes bleeding again.     Outpatient Medications Prior to Visit  Medication Sig Dispense Refill  . chlordiazePOXIDE (LIBRIUM) 25 MG capsule  PO TID x 1D, then 25-50mg  PO BID X 1D, then 25-50mg  PO QD X 1D (Patient not taking: Reported on 10/21/2016) 12 capsule 0  . ferrous sulfate 325 (65 FE) MG tablet Take 1 tablet (325 mg total) by mouth 2 (two) times daily with a meal. (Patient not taking: Reported on 09/06/2016) 60 tablet 0  . folic acid (FOLVITE) 1 MG tablet Take 1 tablet (1 mg total) by mouth daily. (Patient not taking: Reported on 09/06/2016) 30 tablet 0  . lidocaine (LIDODERM) 5 % Place 1 patch onto the skin daily. Remove & Discard patch within 12 hours or as directed by MD (Patient not taking: Reported on 10/21/2016) 7 patch 0  . omeprazole (PRILOSEC) 20 MG capsule Take 1 capsule (20 mg total) by mouth daily. (Patient not taking: Reported on 10/21/2016) 30 capsule 1  . thiamine 100 MG tablet Take 1 tablet (100 mg total) by mouth daily. (Patient not taking: Reported on 09/06/2016) 30 tablet 0   No facility-administered medications prior to visit.      ROS Review of Systems  Constitutional: Negative for chills, fever and malaise/fatigue.  Eyes: Negative for blurred vision.  Respiratory: Negative for shortness of breath.   Cardiovascular: Negative for  chest pain and palpitations.  Gastrointestinal: Negative for abdominal pain and nausea.  Genitourinary: Negative for dysuria and hematuria.  Musculoskeletal: Negative for joint pain and myalgias.  Skin: Negative for rash.       Sore of lip  Neurological: Negative for tingling and headaches.  Psychiatric/Behavioral: Negative for depression. The patient is not nervous/anxious.     Objective:  BP 124/75 (BP Location: Right Arm, Patient Position: Sitting, Cuff Size: Normal)   Pulse 83   Temp 98.7 F (37.1 C) (Oral)   Wt 154 lb 3.2 oz (69.9 kg)   SpO2 98%   BMI 25.66 kg/m   BP/Weight 10/21/2016 09/22/2016 09/08/2016  Systolic BP 124 97 134  Diastolic BP 75 66 87  Wt. (Lbs) 154.2 - -  BMI 25.66 - -      Physical Exam  Constitutional: He is oriented to person, place, and time.  Well developed, well nourished, NAD, polite  HENT:  Head: Normocephalic and atraumatic.  Eyes: No scleral icterus.  Cardiovascular: Normal rate, regular rhythm and normal heart sounds.   Pulmonary/Chest: Effort normal and breath sounds normal. No respiratory distress. He has no wheezes. He has no rales.  Abdominal: Soft. Bowel sounds are normal. There is no tenderness.  Liver edge firm but rounded  Musculoskeletal: He exhibits no edema.  Neurological: He is alert and oriented to person, place, and time. No cranial nerve deficit. Coordination normal.  Skin: Skin is warm and dry. No rash noted. No erythema. No pallor.  Few bruises on arms  Psychiatric: He has a normal mood and affect. His behavior is normal. Thought content normal.  Vitals reviewed.    Assessment & Plan:    1. Alcoholism (HCC) - Begin acamprosate (CAMPRAL) 333 MG tablet; Take 2 tablets (666 mg total) by mouth 3 (three) times daily with meals.  Dispense: 180 tablet; Refill: 0 - Begin thiamine 100 MG tablet; Take 1 tablet (100 mg total) by mouth 3 (three) times daily.  Dispense: 90 tablet; Refill: 1 - Begin LORazepam (ATIVAN) 2 MG  tablet; Take one tablet (2 mg) every hour so long as symptoms of delirium tremens are present. Do not exceed 10 mg per day.  Dispense: 30 tablet; Refill: 0 - CBC with Differential - Comprehensive metabolic panel  2. Bruising - PT/PTT  3. Sore of lip - Appears to have sustained minor trauma. Likely difficulty in healing due to poor platelet count or function. Will assess lab results.  4. Encounter for screening for HIV - HIV antibody  5. Need for Tdap vaccination - Tdap vaccine greater than or equal to 7yo IM  6. Need for prophylactic vaccination and inoculation against influenza - Flu Vaccine QUAD 6+ mos PF IM (Fluarix Quad PF)   Meds ordered this encounter  Medications  . acamprosate (CAMPRAL) 333 MG tablet    Sig: Take 2 tablets (666 mg total) by mouth 3 (three) times daily with meals.    Dispense:  180 tablet    Refill:  0    Order Specific Question:   Supervising Provider    Answer:   Quentin Angst [id number]  . DISCONTD: LORazepam (ATIVAN) 2 MG tablet    Sig: Take 1 tablet (2 mg total) by mouth every 6 (six) hours as needed for anxiety.    Dispense:  30 tablet    Refill:  0    Order Specific Question:   Supervising Provider    Answer:   Quentin Angst [id number]  . thiamine 100 MG tablet    Sig: Take 1 tablet (100 mg total) by mouth 3 (three) times daily.    Dispense:  90 tablet    Refill:  1    Order Specific Question:   Supervising Provider    Answer:   Quentin Angst [id number]  . LORazepam (ATIVAN) 2 MG tablet    Sig: Take one tablet (2 mg) every hour so long as symptoms of delirium tremens are present. Do not exceed 10 mg per day.    Dispense:  30 tablet    Refill:  0    Order Specific Question:   Supervising Provider    Answer:   Quentin Angst [id number]    Follow-up: Return in about 3 weeks (around 11/11/2016).   Loletta Specter PA

## 2016-10-21 NOTE — Progress Notes (Signed)
Pt sleeps alot Pt states that he shakes  When he has not had a drink, and has a sore on lip that bleeds a lot during his sleep Pt states that he is easily bruised

## 2016-10-22 LAB — PT AND PTT
INR: 1.2 (ref 0.8–1.2)
Prothrombin Time: 12.4 s — ABNORMAL HIGH (ref 9.1–12.0)
aPTT: 29 s (ref 24–33)

## 2016-10-23 LAB — CBC WITH DIFFERENTIAL/PLATELET
Basophils Absolute: 0 10*3/uL (ref 0.0–0.2)
Basos: 0 %
EOS (ABSOLUTE): 0 10*3/uL (ref 0.0–0.4)
EOS: 1 %
Hematocrit: 30.5 % — ABNORMAL LOW (ref 37.5–51.0)
Hemoglobin: 10.1 g/dL — ABNORMAL LOW (ref 13.0–17.7)
IMMATURE GRANS (ABS): 0 10*3/uL (ref 0.0–0.1)
Immature Granulocytes: 0 %
LYMPHS ABS: 1.7 10*3/uL (ref 0.7–3.1)
Lymphs: 38 %
MCH: 31.3 pg (ref 26.6–33.0)
MCHC: 33.1 g/dL (ref 31.5–35.7)
MCV: 94 fL (ref 79–97)
MONOCYTES: 10 %
Monocytes Absolute: 0.5 10*3/uL (ref 0.1–0.9)
Neutrophils Absolute: 2.3 10*3/uL (ref 1.4–7.0)
Neutrophils: 51 %
PLATELETS: 74 10*3/uL — AB (ref 150–379)
RBC: 3.23 x10E6/uL — AB (ref 4.14–5.80)
RDW: 14.7 % (ref 12.3–15.4)
WBC: 4.5 10*3/uL (ref 3.4–10.8)

## 2016-10-23 LAB — COMPREHENSIVE METABOLIC PANEL
ALBUMIN: 3.4 g/dL — AB (ref 3.5–5.5)
ALT: 22 IU/L (ref 0–44)
AST: 78 IU/L — ABNORMAL HIGH (ref 0–40)
Albumin/Globulin Ratio: 1 — ABNORMAL LOW (ref 1.2–2.2)
Alkaline Phosphatase: 142 IU/L — ABNORMAL HIGH (ref 39–117)
BILIRUBIN TOTAL: 0.7 mg/dL (ref 0.0–1.2)
BUN / CREAT RATIO: 12 (ref 9–20)
BUN: 6 mg/dL (ref 6–20)
CHLORIDE: 103 mmol/L (ref 96–106)
CO2: 24 mmol/L (ref 20–29)
Calcium: 8.1 mg/dL — ABNORMAL LOW (ref 8.7–10.2)
Creatinine, Ser: 0.49 mg/dL — ABNORMAL LOW (ref 0.76–1.27)
GFR calc Af Amer: 160 mL/min/{1.73_m2} (ref >59)
GFR calc non Af Amer: 139 mL/min/{1.73_m2} (ref >59)
GLUCOSE: 119 mg/dL — AB (ref 65–99)
Globulin, Total: 3.5 g/dL (ref 1.5–4.5)
Potassium: 3.6 mmol/L (ref 3.5–5.2)
Sodium: 142 mmol/L (ref 134–144)
Total Protein: 6.9 g/dL (ref 6.0–8.5)

## 2016-10-23 LAB — HIV ANTIBODY (ROUTINE TESTING W REFLEX): HIV SCREEN 4TH GENERATION: NONREACTIVE

## 2016-10-31 ENCOUNTER — Telehealth (INDEPENDENT_AMBULATORY_CARE_PROVIDER_SITE_OTHER): Payer: Self-pay | Admitting: Physician Assistant

## 2016-10-31 NOTE — Telephone Encounter (Signed)
Pt's wife called regarding [name] work . Please, call them thanks

## 2016-10-31 NOTE — Telephone Encounter (Signed)
Results provided to patients wife. Mathew [name], CMA

## 2016-11-11 ENCOUNTER — Encounter (INDEPENDENT_AMBULATORY_CARE_PROVIDER_SITE_OTHER): Payer: Self-pay | Admitting: Physician Assistant

## 2016-11-11 ENCOUNTER — Ambulatory Visit (INDEPENDENT_AMBULATORY_CARE_PROVIDER_SITE_OTHER): Payer: Self-pay | Admitting: Physician Assistant

## 2016-11-11 VITALS — BP 123/76 | HR 95 | Temp 97.8°F | Resp 18 | Ht 64.0 in | Wt 155.0 lb

## 2016-11-11 DIAGNOSIS — Z9119 Patient's noncompliance with other medical treatment and regimen: Secondary | ICD-10-CM

## 2016-11-11 DIAGNOSIS — D696 Thrombocytopenia, unspecified: Secondary | ICD-10-CM

## 2016-11-11 DIAGNOSIS — Z91199 Patient's noncompliance with other medical treatment and regimen due to unspecified reason: Secondary | ICD-10-CM

## 2016-11-11 DIAGNOSIS — F1020 Alcohol dependence, uncomplicated: Secondary | ICD-10-CM

## 2016-11-11 DIAGNOSIS — D649 Anemia, unspecified: Secondary | ICD-10-CM

## 2016-11-11 MED ORDER — LORAZEPAM 2 MG PO TABS: ORAL_TABLET | ORAL | 0 refills | Status: DC

## 2016-11-11 MED ORDER — NALTREXONE HCL 50 MG PO TABS: 50.0000 mg | ORAL_TABLET | Freq: Every day | ORAL | 0 refills | Status: DC

## 2016-11-11 NOTE — Progress Notes (Signed)
Subjective:  Patient ID: Mathew [name], male    DOB: [birth year]  Age: 36 y.o. MRN: [id number]  CC: alcoholism HPI Mathew [name] is a 36 y.o. male with a medical history of alcoholism presents on f/u of alcoholism. Was seen here 21 days ago and began treatment with acamprosate, thiamine, and Lorazepam. Says he has failed to take acamprosate for one week out of the three. Has also been taking one to three pills of acamprosate at a time. Reports drinking still but has significantly reduced the amount of alcohol he drinks at any one sitting. Has not drank to the point of drunkenness since his last visit. Has taken Lorazepam as needed for DT. Normally uses zero to one pill of Lorazepam but had to use up to 3 tablets spaced one hour apart which were helpful in preventing DTs. Reports taking thiamine TID. Feels the need to be "locked up" in a rehabilitation center to stay away from temptation. Does not endorse any other symptoms or complaints.      Outpatient Medications Prior to Visit  Medication Sig Dispense Refill  . acamprosate (CAMPRAL) 333 MG tablet Take 2 tablets (666 mg total) by mouth 3 (three) times daily with meals. 180 tablet 0  . ferrous sulfate 325 (65 FE) MG tablet Take 1 tablet (325 mg total) by mouth 2 (two) times daily with a meal. (Patient not taking: Reported on 09/06/2016) 60 tablet 0  . LORazepam (ATIVAN) 2 MG tablet Take one tablet (2 mg) every hour so long as symptoms of delirium tremens are present. Do not exceed 10 mg per day. 30 tablet 0  . thiamine 100 MG tablet Take 1 tablet (100 mg total) by mouth 3 (three) times daily. 90 tablet 1   No facility-administered medications prior to visit.      ROS Review of Systems  Constitutional: Negative for chills, fever and malaise/fatigue.  Eyes: Negative for blurred vision.  Respiratory: Negative for shortness of breath.   Cardiovascular: Negative for chest pain and palpitations.  Gastrointestinal: Negative for abdominal pain and  nausea.  Genitourinary: Negative for dysuria and hematuria.  Musculoskeletal: Negative for joint pain and myalgias.  Skin: Negative for rash.  Neurological: Negative for tingling and headaches.  Psychiatric/Behavioral: Negative for depression. The patient is not nervous/anxious.     Objective:    Physical Exam  Constitutional: He is oriented to person, place, and time.  Well developed, well nourished, NAD, polite  HENT:  Head: Normocephalic and atraumatic.  Eyes: No scleral icterus.  Neck: Normal range of motion. Neck supple. No thyromegaly present.  Cardiovascular: Normal rate, regular rhythm and normal heart sounds.   Pulmonary/Chest: Effort normal and breath sounds normal.  Abdominal: Soft. Bowel sounds are normal. There is no tenderness.  Musculoskeletal: He exhibits no edema.  Neurological: He is alert and oriented to person, place, and time. No cranial nerve deficit. Coordination normal.  Skin: Skin is warm and dry. No rash noted. No erythema. No pallor.  Psychiatric: He has a normal mood and affect. His behavior is normal. Thought content normal.  Vitals reviewed.    Assessment & Plan:    1. Alcoholism (HCC) - Acamprosate seems to have helped in reduction of alcohol consumption. However, he is noncompliant with acamprosate perhaps due to the dose and frequency of regimen. He is advised to stop acamprosate in favor of using a simpler regimen with Naltrexone. - Begin naltrexone (DEPADE) 50 MG tablet; Take 1 tablet (50 mg total) by mouth daily.  Dispense: 30  tablet; Refill: 0 - Refill LORazepam (ATIVAN) 2 MG tablet; Take one tablet (2 mg) every hour so long as symptoms of delirium tremens are present. Do not exceed 10 mg per day.  Dispense: 25 tablet; Refill: 0  2. Anemia, unspecified type - CBC with Differential; Future  3. Thrombocytopenia (HCC) - CBC with Differential; Future  4. Noncompliance - Did not take acamprosate as directed. I am changing therapy to  Naltrexone as this will be easier for him to take as a once daily regimen.   Meds ordered this encounter  Medications  . naltrexone (DEPADE) 50 MG tablet    Sig: Take 1 tablet (50 mg total) by mouth daily.    Dispense:  30 tablet    Refill:  0    Order Specific Question:   Supervising Provider    Answer:   Quentin AngstJEGEDE, OLUGBEMIGA E [id number]  . LORazepam (ATIVAN) 2 MG tablet    Sig: Take one tablet (2 mg) every hour so long as symptoms of delirium tremens are present. Do not exceed 10 mg per day.    Dispense:  25 tablet    Refill:  0    Order Specific Question:   Supervising Provider    Answer:   Quentin AngstJEGEDE, OLUGBEMIGA E [id number]    Follow-up: Return in about 4 weeks (around 12/09/2016) for alcoholism.   Loletta Specteroger David Marceil Welp PA

## 2016-11-11 NOTE — Patient Instructions (Signed)
Abstinencia del alcohol °(Alcohol Withdrawal) °Cuando una persona que bebe mucho alcohol deja de hacerlo, puede sufrir síntomas de abstinencia del alcohol. La abstinencia del alcohol causa problemas. Puede hacer que sienta lo siguiente: °· Cansancio (fatiga). °· Tristeza (depresión). °· Temor (ansiedad). °· Malhumor (irritabilidad). °· Falta de hambre. °· Ganas de vomitar (náuseas). °· Temblores. °También puede hacer que tenga lo siguiente: °· Pesadillas. °· Dificultad para dormir. °· Dificultad para pensar claramente. °· Cambios en el estado de ánimo. °· Piel húmeda. °· Sudoraciones intensas. °· Latidos cardíacos muy rápidos. °· Temblores que no puede controlar. °· Fiebre. °· Una crisis de movimientos que no puede controlar (convulsiones). °· Confusión. °· Vómitos. °· Ver o sentir cosas que no existen (alucinaciones). °CUIDADOS EN EL HOGAR °· Tome los medicamentos y las vitaminas solamente como se lo haya indicado el médico. °· No beba alcohol. °· Cuente con alguien cerca por si necesita ayuda. °· Beba suficiente líquido para mantener el pis (orina) claro o de color amarillo pálido. °· Considere la posibilidad de unirse a un grupo que lo ayude a dejar de beber. °SOLICITE AYUDA SI: °· Los problemas empeoran. °· Los problemas no desaparecen. °· No puede comer o tomar líquido sin vomitar. °· Está teniendo mucha dificultad para dejar de beber alcohol. °· No puede dejar de beber alcohol. °SOLICITE AYUDA DE INMEDIATO SI: °· Siente que el corazón late de forma diferente a la habitual. °· Le duele el pecho. °· Tiene dificultad para respirar. °· Tiene problemas graves, por ejemplo: °? Fiebre. °? Una crisis de movimientos que no puede controlar. °? Mucha confusión. °? Ver o sentir cosas que no existen. °Esta información no tiene como fin reemplazar el consejo del médico. Asegúrese de hacerle al médico cualquier pregunta que tenga. °Document Released: 01/29/2010 Document Revised: 01/17/2014 Document Reviewed:  10/15/2013 °Elsevier Interactive Patient Education © 2018 Elsevier Inc. ° °

## 2016-11-18 ENCOUNTER — Telehealth: Payer: Self-pay | Admitting: Licensed Clinical Social Worker

## 2016-11-18 NOTE — Telephone Encounter (Signed)
[address], Apolinar JunesBrandon [phone](ID#[id number]), was utilized to contact pt at both numbers on file. Pt did not answer and HIPPA compliant messages were left for a return call.

## 2016-11-22 ENCOUNTER — Telehealth: Payer: Self-pay | Admitting: Licensed Clinical Social Worker

## 2016-11-22 NOTE — Telephone Encounter (Signed)
[address], Mathew [name] [phone](ID#[id number]), was utilized in an attempt to contact pt. A woman answered home number on file and reported that she did not know pt, it was a wrong number.    Pt did not answer mobile number. A HIPPA compliant message was left requesting a return call.

## 2016-11-23 ENCOUNTER — Ambulatory Visit: Payer: Self-pay

## 2016-12-05 ENCOUNTER — Ambulatory Visit: Payer: Self-pay

## 2016-12-09 ENCOUNTER — Ambulatory Visit (INDEPENDENT_AMBULATORY_CARE_PROVIDER_SITE_OTHER): Payer: Self-pay | Admitting: Physician Assistant

## 2017-10-11 ENCOUNTER — Emergency Department (HOSPITAL_COMMUNITY): Payer: Self-pay

## 2017-10-11 ENCOUNTER — Encounter (HOSPITAL_COMMUNITY): Payer: Self-pay | Admitting: Emergency Medicine

## 2017-10-11 ENCOUNTER — Emergency Department (HOSPITAL_COMMUNITY)
Admission: EM | Admit: 2017-10-11 | Discharge: 2017-10-11 | Disposition: A | Discharge status: Home / Self Care | Payer: Self-pay | Attending: Emergency Medicine | Admitting: Emergency Medicine

## 2017-10-11 ENCOUNTER — Other Ambulatory Visit: Payer: Self-pay

## 2017-10-11 DIAGNOSIS — D649 Anemia, unspecified: Secondary | ICD-10-CM

## 2017-10-11 DIAGNOSIS — B9789 Other viral agents as the cause of diseases classified elsewhere: Secondary | ICD-10-CM | POA: Insufficient documentation

## 2017-10-11 DIAGNOSIS — Z79899 Other long term (current) drug therapy: Secondary | ICD-10-CM | POA: Insufficient documentation

## 2017-10-11 DIAGNOSIS — J069 Acute upper respiratory infection, unspecified: Secondary | ICD-10-CM

## 2017-10-11 LAB — COMPREHENSIVE METABOLIC PANEL
ALT: 20 U/L (ref 0–44)
ANION GAP: 7 (ref 5–15)
AST: 61 U/L — ABNORMAL HIGH (ref 15–41)
Albumin: 3.2 g/dL — ABNORMAL LOW (ref 3.5–5.0)
Alkaline Phosphatase: 98 U/L (ref 38–126)
BUN: <5 mg/dL — ABNORMAL LOW (ref 6–20)
CHLORIDE: 109 mmol/L (ref 98–111)
CO2: 24 mmol/L (ref 22–32)
Calcium: 7.7 mg/dL — ABNORMAL LOW (ref 8.9–10.3)
Creatinine, Ser: 0.63 mg/dL (ref 0.61–1.24)
Glucose, Bld: 179 mg/dL — ABNORMAL HIGH (ref 70–99)
POTASSIUM: 3.1 mmol/L — AB (ref 3.5–5.1)
Sodium: 140 mmol/L (ref 135–145)
Total Bilirubin: 0.8 mg/dL (ref 0.3–1.2)
Total Protein: 6.7 g/dL (ref 6.5–8.1)

## 2017-10-11 LAB — CBC
HEMATOCRIT: 26.2 % — AB (ref 39.0–52.0)
Hemoglobin: 7.2 g/dL — ABNORMAL LOW (ref 13.0–17.0)
MCH: 18.7 pg — ABNORMAL LOW (ref 26.0–34.0)
MCHC: 27.5 g/dL — ABNORMAL LOW (ref 30.0–36.0)
MCV: 68.1 fL — AB (ref 78.0–100.0)
PLATELETS: 84 10*3/uL — AB (ref 150–400)
RBC: 3.85 MIL/uL — AB (ref 4.22–5.81)
RDW: 21.6 % — ABNORMAL HIGH (ref 11.5–15.5)
WBC: 3 10*3/uL — AB (ref 4.0–10.5)

## 2017-10-11 LAB — LIPASE, BLOOD: LIPASE: 37 U/L (ref 11–51)

## 2017-10-11 LAB — URINALYSIS, ROUTINE W REFLEX MICROSCOPIC
Bilirubin Urine: NEGATIVE
GLUCOSE, UA: NEGATIVE mg/dL
Hgb urine dipstick: NEGATIVE
Ketones, ur: NEGATIVE mg/dL
Leukocytes, UA: NEGATIVE
Nitrite: NEGATIVE
PH: 5 (ref 5.0–8.0)
PROTEIN: NEGATIVE mg/dL
Specific Gravity, Urine: 1.005 (ref 1.005–1.030)

## 2017-10-11 LAB — ETHANOL: Alcohol, Ethyl (B): 380 mg/dL (ref <10)

## 2017-10-11 LAB — POC OCCULT BLOOD, ED: Fecal Occult Bld: NEGATIVE

## 2017-10-11 NOTE — ED Provider Notes (Signed)
[facility] EMERGENCY DEPARTMENT Provider Note   CSN: [id number] Arrival date & time: 10/11/17  1522     History   Chief Complaint Chief Complaint  Patient presents with  . Cough  . Nasal Congestion  . Generalized Body Aches  . Jaundice    HPI Mathew [name] is a 37 y.o. male.  37yo male with non productive cough and congestion x 1 week. Denies fevers, chills, sweats, abdominal pain, nausea, vomiting. Reports pain across his back and forehead.  No other complaints or concerns.  Per triage note family has noticed that patient's skin appears yellow, unknown how long he has had this appearance.  Patient reports drinking one 40 ounce beer today.     History reviewed. No pertinent past medical history.  Patient Active Problem List   Diagnosis Date Noted  . Alcohol use disorder 07/09/2016  . Hypokalemia 07/09/2016  . Anemia 07/09/2016  . Thrombocytopenia (HCC) 07/09/2016  . Acute encephalopathy 07/09/2016  . Seizure due to alcohol withdrawal, with unspecified complication (HCC) 07/09/2016    History reviewed. No pertinent surgical history.      Home Medications    Prior to Admission medications   Medication Sig Start Date End Date Taking? Authorizing Provider  LORazepam (ATIVAN) 2 MG tablet Take one tablet (2 mg) every hour so long as symptoms of delirium tremens are present. Do not exceed 10 mg per day. 11/11/16   Loletta Specter, PA-C  naltrexone (DEPADE) 50 MG tablet Take 1 tablet (50 mg total) by mouth daily. 11/11/16   Loletta Specter, PA-C  thiamine 100 MG tablet Take 1 tablet (100 mg total) by mouth 3 (three) times daily. 10/21/16   Loletta Specter, PA-C    Family History No family history on file.  Social History Social History   Tobacco Use  . Smoking status: Never Smoker  . Smokeless tobacco: Never Used  Substance Use Topics  . Alcohol use: Yes    Comment: daily   . Drug use: No     Allergies   Patient has no known  allergies.   Review of Systems Review of Systems  Constitutional: Negative for chills, fatigue and fever.  HENT: Positive for congestion. Negative for ear discharge, ear pain, rhinorrhea, sinus pressure, sinus pain, sneezing and sore throat.   Respiratory: Positive for cough. Negative for chest tightness and shortness of breath.   Cardiovascular: Negative for chest pain.  Gastrointestinal: Negative for abdominal pain, blood in stool, constipation, diarrhea, nausea and vomiting.  Musculoskeletal: Positive for back pain.  Skin: Negative for rash and wound.  Allergic/Immunologic: Negative for immunocompromised state.  Neurological: Negative for dizziness, weakness, light-headedness and headaches.  Hematological: Does not bruise/bleed easily.  Psychiatric/Behavioral: Negative for confusion.  All other systems reviewed and are negative.    Physical Exam Updated Vital Signs BP 128/78 (BP Location: Right Arm)   Pulse 94   Temp 98.3 F (36.8 C) (Oral)   Resp 14   SpO2 99%   Physical Exam  Constitutional: He is oriented to person, place, and time. He appears well-developed and well-nourished. No distress.  HENT:  Head: Normocephalic and atraumatic.  Right Ear: External ear normal.  Left Ear: External ear normal.  Nose: Mucosal edema present.  Mouth/Throat: Mucous membranes are normal. No trismus in the jaw. No uvula swelling. Posterior oropharyngeal erythema present. No oropharyngeal exudate or posterior oropharyngeal edema.  Eyes: Right eye exhibits no discharge. Left eye exhibits no discharge. Scleral icterus is present.  Cardiovascular: Normal  rate, regular rhythm, normal heart sounds and intact distal pulses.  No murmur heard. Pulmonary/Chest: Effort normal and breath sounds normal. No respiratory distress.  Abdominal: Soft. Bowel sounds are normal. He exhibits no distension. There is no tenderness. There is no guarding.  Musculoskeletal: He exhibits no tenderness or deformity.    Neurological: He is alert and oriented to person, place, and time.  Skin: Skin is warm and dry. He is not diaphoretic.  Psychiatric: He has a normal mood and affect. His behavior is normal.  Nursing note and vitals reviewed.    ED Treatments / Results  Labs (all labs ordered are listed, but only abnormal results are displayed) Labs Reviewed  COMPREHENSIVE METABOLIC PANEL - Abnormal; Notable for the following components:      Result Value   Potassium 3.1 (*)    Glucose, Bld 179 (*)    BUN <5 (*)    Calcium 7.7 (*)    Albumin 3.2 (*)    AST 61 (*)    All other components within normal limits  CBC - Abnormal; Notable for the following components:   WBC 3.0 (*)    RBC 3.85 (*)    Hemoglobin 7.2 (*)    HCT 26.2 (*)    MCV 68.1 (*)    MCH 18.7 (*)    MCHC 27.5 (*)    RDW 21.6 (*)    Platelets 84 (*)    All other components within normal limits  URINALYSIS, ROUTINE W REFLEX MICROSCOPIC - Abnormal; Notable for the following components:   Color, Urine STRAW (*)    All other components within normal limits  ETHANOL - Abnormal; Notable for the following components:   Alcohol, Ethyl (B) 380 (*)    All other components within normal limits  LIPASE, BLOOD  POC OCCULT BLOOD, ED    EKG None  Radiology Dg Chest 2 View  Result Date: 10/11/2017 CLINICAL DATA:  Mild chronic bronchitic changes without infiltrate. EXAM: CHEST - 2 VIEW COMPARISON:  09/22/2016 FINDINGS: Upper normal heart size. Mediastinal contours and pulmonary vascularity normal. Lungs clear. Minimal central peribronchial thickening, chronic. No infiltrate, pleural effusion or pneumothorax. IMPRESSION: Bones unremarkable. Electronically Signed   By: Ulyses Southward M.D.   On: 10/11/2017 17:30    Procedures Procedures (including critical care time)  Medications Ordered in ED Medications - No data to display   Initial Impression / Assessment and Plan / ED Course  I have reviewed the triage vital signs and the nursing  notes.  Pertinent labs & imaging results that were available during my care of the patient were reviewed by me and considered in my medical decision making (see chart for details).  Clinical Course as of Oct 11 2141  Wed Oct 11, 2017  6056 37 year old male presents with complaint of nonproductive cough and congestion with pain across his forehead and mid back.  On exam he has boggy mucous membranes, mild postnasal drip otherwise unremarkable.  He does have a little scleral icterus, his abdomen is soft and nontender.  Review of lab work shows a normal lipase, patient's blood alcohol level 380, he does say he has had one 40 ounce beer today, urinalysis remarkable, CMP with mild hypokalemia with potassium at 3.1, elevated AST at 61.  Patient's CBC shows that he is anemic with a hemoglobin of 7.2, platelets noted to be at 84, white blood cell count 3.0.  Compared to previous CBC, patient's last CBC was done in October 2018, hemoglobin was 10 at that  time.  Patient denies blood in his stools, denies vomiting or bloody emesis.  He denies feeling weak, dizzy, lightheaded.  No history of prior transfusions.   [LM]  2141 Patient's Hemoccult is negative. Discussed with Dr. Effie Shy, ER attending, patient will be dc with referral to PCP for further evaluation. Advised patient to stop drinking, discussed effects of alcohol on his liver/health.   [LM]    Clinical Course User Index [LM] Jeannie Fend, PA-C   Final Clinical Impressions(s) / ED Diagnoses   Final diagnoses:  Viral URI with cough  Anemia, unspecified type    ED Discharge Orders    None       Jeannie Fend, PA-C 10/11/17 2144    Mancel Bale, MD 10/13/17 1415

## 2017-10-11 NOTE — ED Triage Notes (Addendum)
C/o productive cough with green phlegm, nasal congestion, and generalized body aches x 1 week.  Pt appears jaundice.  Asked if skin color looked normal to family and they report he looks "yellow" to them.  C/o pain all over but states lower back is hurting the worse.  Denies abd pain.  Abd soft and non-tender.  Heavy etoh.

## 2017-10-11 NOTE — ED Provider Notes (Signed)
Patient placed in Quick Look pathway, seen and evaluated   Chief Complaint: cough  HPI: Mathew [name] is a 37 y.o. male who presents to the ED with productive cough with green phlegm, nasal congestion and general body aches. Patient c/o pain all over but most pain is in the lower back. Patient's family reports that he drinks a lot of alcohol.  ROS: Resp: cough, congestion  Skin:color change  Physical Exam:  BP 125/76 (BP Location: Right Arm)   Pulse 98   Temp 98.3 F (36.8 C) (Oral)   Resp 16   SpO2 100%    Gen: No distress  Neuro: Awake and Alert  Skin: appears jaundice  M/S: tender with palpation of lower back  Abdomen: soft non tender with palpation.   Initiation of care has begun. The patient has been counseled on the process, plan, and necessity for staying for the completion/evaluation, and the remainder of the medical screening examination    Janne Napoleon, NP 10/11/17 1626    Pricilla Loveless, MD 10/11/17 2351

## 2017-10-11 NOTE — Discharge Instructions (Addendum)
Follow-up with a primary care provider, referral given, call office in the morning. Take a multivitamin with iron daily. Stop drinking alcohol.

## 2017-10-11 NOTE — ED Notes (Addendum)
Discharge instructions reviewed with patient. Interpreter used. Pt denied any further questions at this time.

## 2017-11-09 ENCOUNTER — Inpatient Hospital Stay (INDEPENDENT_AMBULATORY_CARE_PROVIDER_SITE_OTHER): Payer: Self-pay | Admitting: Physician Assistant

## 2018-03-13 ENCOUNTER — Other Ambulatory Visit: Payer: Self-pay

## 2018-03-13 ENCOUNTER — Encounter (HOSPITAL_COMMUNITY): Payer: Self-pay

## 2018-03-13 ENCOUNTER — Emergency Department (HOSPITAL_COMMUNITY): Payer: Self-pay

## 2018-03-13 ENCOUNTER — Inpatient Hospital Stay (HOSPITAL_COMMUNITY)
Admission: EM | Admit: 2018-03-13 | Discharge: 2018-03-17 | DRG: 378 | Disposition: A | Discharge status: Home / Self Care | Payer: Self-pay | Attending: Internal Medicine | Admitting: Internal Medicine

## 2018-03-13 DIAGNOSIS — D649 Anemia, unspecified: Secondary | ICD-10-CM

## 2018-03-13 DIAGNOSIS — K449 Diaphragmatic hernia without obstruction or gangrene: Secondary | ICD-10-CM | POA: Diagnosis present

## 2018-03-13 DIAGNOSIS — K7460 Unspecified cirrhosis of liver: Secondary | ICD-10-CM

## 2018-03-13 DIAGNOSIS — K922 Gastrointestinal hemorrhage, unspecified: Secondary | ICD-10-CM

## 2018-03-13 DIAGNOSIS — I8500 Esophageal varices without bleeding: Secondary | ICD-10-CM | POA: Diagnosis present

## 2018-03-13 DIAGNOSIS — K7031 Alcoholic cirrhosis of liver with ascites: Secondary | ICD-10-CM

## 2018-03-13 DIAGNOSIS — K7030 Alcoholic cirrhosis of liver without ascites: Secondary | ICD-10-CM | POA: Diagnosis present

## 2018-03-13 DIAGNOSIS — D696 Thrombocytopenia, unspecified: Secondary | ICD-10-CM

## 2018-03-13 DIAGNOSIS — F1010 Alcohol abuse, uncomplicated: Secondary | ICD-10-CM

## 2018-03-13 DIAGNOSIS — R569 Unspecified convulsions: Secondary | ICD-10-CM | POA: Diagnosis present

## 2018-03-13 DIAGNOSIS — K760 Fatty (change of) liver, not elsewhere classified: Secondary | ICD-10-CM | POA: Diagnosis present

## 2018-03-13 DIAGNOSIS — K920 Hematemesis: Principal | ICD-10-CM

## 2018-03-13 DIAGNOSIS — D6959 Other secondary thrombocytopenia: Secondary | ICD-10-CM | POA: Diagnosis present

## 2018-03-13 DIAGNOSIS — D62 Acute posthemorrhagic anemia: Secondary | ICD-10-CM

## 2018-03-13 DIAGNOSIS — K766 Portal hypertension: Secondary | ICD-10-CM | POA: Diagnosis present

## 2018-03-13 DIAGNOSIS — D509 Iron deficiency anemia, unspecified: Secondary | ICD-10-CM | POA: Diagnosis present

## 2018-03-13 DIAGNOSIS — F10239 Alcohol dependence with withdrawal, unspecified: Secondary | ICD-10-CM | POA: Diagnosis present

## 2018-03-13 DIAGNOSIS — R251 Tremor, unspecified: Secondary | ICD-10-CM | POA: Diagnosis present

## 2018-03-13 DIAGNOSIS — R188 Other ascites: Secondary | ICD-10-CM

## 2018-03-13 HISTORY — DX: Unspecified convulsions: R56.9

## 2018-03-13 HISTORY — DX: Alcohol use, unspecified with withdrawal, unspecified: F10.939

## 2018-03-13 HISTORY — DX: Alcohol abuse, uncomplicated: F10.10

## 2018-03-13 HISTORY — DX: Alcohol dependence with withdrawal, unspecified: F10.239

## 2018-03-13 LAB — COMPREHENSIVE METABOLIC PANEL
ALBUMIN: 2.5 g/dL — AB (ref 3.5–5.0)
ALT: 24 U/L (ref 0–44)
ANION GAP: 6 (ref 5–15)
AST: 56 U/L — AB (ref 15–41)
Alkaline Phosphatase: 109 U/L (ref 38–126)
BUN: <5 mg/dL — ABNORMAL LOW (ref 6–20)
CHLORIDE: 109 mmol/L (ref 98–111)
CO2: 22 mmol/L (ref 22–32)
Calcium: 7.5 mg/dL — ABNORMAL LOW (ref 8.9–10.3)
Creatinine, Ser: 0.51 mg/dL — ABNORMAL LOW (ref 0.61–1.24)
GFR calc Af Amer: >60 mL/min (ref >60)
GFR calc non Af Amer: >60 mL/min (ref >60)
GLUCOSE: 99 mg/dL (ref 70–99)
POTASSIUM: 3.4 mmol/L — AB (ref 3.5–5.1)
SODIUM: 137 mmol/L (ref 135–145)
TOTAL PROTEIN: 6.2 g/dL — AB (ref 6.5–8.1)
Total Bilirubin: 1.9 mg/dL — ABNORMAL HIGH (ref 0.3–1.2)

## 2018-03-13 LAB — CBC
HEMATOCRIT: 22.9 % — AB (ref 39.0–52.0)
Hemoglobin: 6.3 g/dL — CL (ref 13.0–17.0)
MCH: 20.7 pg — AB (ref 26.0–34.0)
MCHC: 27.5 g/dL — ABNORMAL LOW (ref 30.0–36.0)
MCV: 75.1 fL — AB (ref 80.0–100.0)
Platelets: 70 10*3/uL — ABNORMAL LOW (ref 150–400)
RBC: 3.05 MIL/uL — AB (ref 4.22–5.81)
RDW: 21.1 % — ABNORMAL HIGH (ref 11.5–15.5)
WBC: 3.5 10*3/uL — ABNORMAL LOW (ref 4.0–10.5)
nRBC: 0 % (ref 0.0–0.2)

## 2018-03-13 LAB — PROTIME-INR
INR: 1.9 — ABNORMAL HIGH (ref 0.8–1.2)
Prothrombin Time: 21.3 seconds — ABNORMAL HIGH (ref 11.4–15.2)

## 2018-03-13 LAB — POC OCCULT BLOOD, ED: FECAL OCCULT BLD: NEGATIVE

## 2018-03-13 LAB — PREPARE RBC (CROSSMATCH)

## 2018-03-13 LAB — ABO/RH: ABO/RH(D): O POS

## 2018-03-13 LAB — LIPASE, BLOOD: LIPASE: 39 U/L (ref 11–51)

## 2018-03-13 LAB — ETHANOL: ALCOHOL ETHYL (B): 88 mg/dL — AB (ref <10)

## 2018-03-13 MED ORDER — ONDANSETRON HCL 4 MG PO TABS: 4.0000 mg | ORAL_TABLET | Freq: Four times a day (QID) | ORAL | Status: DC | PRN

## 2018-03-13 MED ORDER — SODIUM CHLORIDE 0.9% FLUSH
3.0000 mL | Freq: Once | INTRAVENOUS | Status: AC
  Administered 2018-03-13: 3 mL via INTRAVENOUS

## 2018-03-13 MED ORDER — SODIUM CHLORIDE 0.9 % IV SOLN
10.0000 mL/h | Freq: Once | INTRAVENOUS | Status: AC
  Administered 2018-03-13: 10 mL/h via INTRAVENOUS

## 2018-03-13 MED ORDER — ACETAMINOPHEN 325 MG PO TABS
650.0000 mg | ORAL_TABLET | Freq: Four times a day (QID) | ORAL | Status: DC | PRN
  Administered 2018-03-14: 650 mg via ORAL
  Filled 2018-03-13: qty 2

## 2018-03-13 MED ORDER — VITAMIN B-1 100 MG PO TABS
100.0000 mg | ORAL_TABLET | Freq: Every day | ORAL | Status: DC
  Administered 2018-03-15 – 2018-03-17 (×3): 100 mg via ORAL
  Filled 2018-03-13 (×3): qty 1

## 2018-03-13 MED ORDER — LORAZEPAM 1 MG PO TABS
0.0000 mg | ORAL_TABLET | Freq: Four times a day (QID) | ORAL | Status: AC
  Administered 2018-03-14 (×2): 1 mg via ORAL
  Filled 2018-03-13 (×2): qty 1

## 2018-03-13 MED ORDER — THIAMINE HCL 100 MG/ML IJ SOLN
100.0000 mg | Freq: Every day | INTRAMUSCULAR | Status: DC
  Administered 2018-03-13 – 2018-03-14 (×2): 100 mg via INTRAVENOUS
  Filled 2018-03-13 (×2): qty 2

## 2018-03-13 MED ORDER — ONDANSETRON HCL 4 MG/2ML IJ SOLN: 4.0000 mg | Freq: Four times a day (QID) | INTRAMUSCULAR | Status: DC | PRN

## 2018-03-13 MED ORDER — LORAZEPAM 2 MG/ML IJ SOLN: 0.0000 mg | Freq: Two times a day (BID) | INTRAMUSCULAR | Status: DC

## 2018-03-13 MED ORDER — ACETAMINOPHEN 650 MG RE SUPP: 650.0000 mg | Freq: Four times a day (QID) | RECTAL | Status: DC | PRN

## 2018-03-13 MED ORDER — SODIUM CHLORIDE 0.9 % IV SOLN
8.0000 mg/h | INTRAVENOUS | Status: AC
  Administered 2018-03-13 – 2018-03-16 (×7): 8 mg/h via INTRAVENOUS
  Filled 2018-03-13 (×9): qty 80

## 2018-03-13 MED ORDER — LORAZEPAM 1 MG PO TABS: 0.0000 mg | ORAL_TABLET | Freq: Two times a day (BID) | ORAL | Status: DC

## 2018-03-13 MED ORDER — LORAZEPAM 2 MG/ML IJ SOLN
0.0000 mg | Freq: Four times a day (QID) | INTRAMUSCULAR | Status: AC
  Administered 2018-03-13: 2 mg via INTRAVENOUS
  Filled 2018-03-13: qty 1

## 2018-03-13 MED ORDER — PANTOPRAZOLE SODIUM 40 MG IV SOLR
40.0000 mg | Freq: Once | INTRAVENOUS | Status: AC
  Administered 2018-03-13: 40 mg via INTRAVENOUS
  Filled 2018-03-13: qty 40

## 2018-03-13 MED ORDER — IOHEXOL 300 MG/ML  SOLN
100.0000 mL | Freq: Once | INTRAMUSCULAR | Status: AC | PRN
  Administered 2018-03-13: 100 mL via INTRAVENOUS

## 2018-03-13 NOTE — ED Triage Notes (Addendum)
Pt reports abdominal distention and leg swelling. Pt reports he has been having a lot of pain in his right abdominal area. Pt drinks 4-40oz/day last drink yesterday.

## 2018-03-13 NOTE — ED Notes (Signed)
ED TO INPATIENT HANDOFF REPORT  ED Nurse Name and Phone #: Albertina Parr [phone]  S Name/Age/Gender [facility] 38 y.o. male Room/Bed: 019C/019C  Code Status   Code Status: Full Code  Home/SNF/Other Home Patient oriented to: self, place, time and situation Is this baseline? Yes   Triage Complete: Triage complete  Chief Complaint Stomach Extention; Lower Extremity Swelling; R Side Pain  Triage Note Pt reports abdominal distention and leg swelling. Pt reports he has been having a lot of pain in his right abdominal area. Pt drinks 4-40oz/day last drink yesterday.    Allergies No Known Allergies  Level of Care/Admitting Diagnosis ED Disposition    ED Disposition Condition Comment   Admit  Hospital Area: [facility] [[id number]]  Level of Care: Medical Telemetry [104]  Diagnosis: Acute blood loss anemia [373428]  Admitting Physician: Wyvonnia Dusky  Attending Physician: Hillary Bow [phone]  Estimated length of stay: past midnight tomorrow  Certification:: I certify this patient will need inpatient services for at least 2 midnights  PT Class (Do Not Modify): Inpatient [101]  PT Acc Code (Do Not Modify): Private [1]       B Medical/Surgery History Past Medical History:  Diagnosis Date  . Alcohol withdrawal seizure (HCC)   . ETOH abuse    History reviewed. No pertinent surgical history.   A IV Location/Drains/Wounds Patient Lines/Drains/Airways Status   Active Line/Drains/Airways    Name:   Placement date:   Placement time:   Site:   Days:   Peripheral IV 03/13/18 Left Antecubital   03/13/18    1637    Antecubital   less than 1   Peripheral IV 03/13/18 Left;Lateral Forearm   03/13/18    2130    Forearm   less than 1          Intake/Output Last 24 hours  Intake/Output Summary (Last 24 hours) at 03/13/2018 2151 Last data filed at 03/13/2018 2110 Gross per 24 hour  Intake 333 ml  Output -  Net 333 ml    Labs/Imaging Results  for orders placed or performed during the hospital encounter of 03/13/18 (from the past 48 hour(s))  Lipase, blood     Status: None   Collection Time: 03/13/18  2:15 PM  Result Value Ref Range   Lipase 39 11 - 51 U/L    Comment: Performed at [facility], [address], [place], [place] [address]  Comprehensive metabolic panel     Status: Abnormal   Collection Time: 03/13/18  2:15 PM  Result Value Ref Range   Sodium 137 135 - 145 mmol/L   Potassium 3.4 (L) 3.5 - 5.1 mmol/L   Chloride 109 98 - 111 mmol/L   CO2 22 22 - 32 mmol/L   Glucose, Bld 99 70 - 99 mg/dL   BUN <5 (L) 6 - 20 mg/dL   Creatinine, Ser 2.03 (L) 0.61 - 1.24 mg/dL   Calcium 7.5 (L) 8.9 - 10.3 mg/dL   Total Protein 6.2 (L) 6.5 - 8.1 g/dL   Albumin 2.5 (L) 3.5 - 5.0 g/dL   AST 56 (H) 15 - 41 U/L   ALT 24 0 - 44 U/L   Alkaline Phosphatase 109 38 - 126 U/L   Total Bilirubin 1.9 (H) 0.3 - 1.2 mg/dL   GFR calc non Af Amer >60 >60 mL/min   GFR calc Af Amer >60 >60 mL/min   Anion gap 6 5 - 15    Comment:  Performed at [facility], [address] [place], [place] [address]  CBC     Status: Abnormal   Collection Time: 03/13/18  2:15 PM  Result Value Ref Range   WBC 3.5 (L) 4.0 - 10.5 K/uL   RBC 3.05 (L) 4.22 - 5.81 MIL/uL   Hemoglobin 6.3 (LL) 13.0 - 17.0 g/dL    Comment: REPEATED TO VERIFY Reticulocyte Hemoglobin testing may be clinically indicated, consider ordering this additional test [id number] THIS CRITICAL RESULT HAS VERIFIED AND BEEN CALLED TO KAITLYN PRUITT,RN BY ZELDA BEECH ON 03 03 2020 AT 1514, AND HAS BEEN READ BACK.     HCT 22.9 (L) 39.0 - 52.0 %   MCV 75.1 (L) 80.0 - 100.0 fL   MCH 20.7 (L) 26.0 - 34.0 pg   MCHC 27.5 (L) 30.0 - 36.0 g/dL   RDW 11.9 (H) 14.7 - 82.9 %   Platelets 70 (L) 150 - 400 K/uL    Comment: REPEATED TO VERIFY PLATELET COUNT CONFIRMED BY SMEAR Immature Platelet Fraction may be clinically indicated, consider ordering this additional test [id number]    nRBC 0.0 0.0 -  0.2 %    Comment: Performed at [facility], [address] [place], [place] [address]  POC occult blood, ED     Status: None   Collection Time: 03/13/18  4:15 PM  Result Value Ref Range   Fecal Occult Bld NEGATIVE NEGATIVE  Type and screen     Status: None (Preliminary result)   Collection Time: 03/13/18  4:20 PM  Result Value Ref Range   ABO/RH(D) O POS    Antibody Screen NEG    Sample Expiration 03/16/2018    Unit Number [id number]    Blood Component Type RED CELLS,LR    Unit division 00    Status of Unit ISSUED    Transfusion Status OK TO TRANSFUSE    Crossmatch Result Compatible    Unit Number [id number]    Blood Component Type RED CELLS,LR    Unit division 00    Status of Unit ISSUED    Transfusion Status OK TO TRANSFUSE    Crossmatch Result      Compatible Performed at [facility], [address] [place], [place] [address]   Prepare RBC     Status: None   Collection Time: 03/13/18  4:20 PM  Result Value Ref Range   Order Confirmation      ORDER PROCESSED BY BLOOD BANK Performed at [facility], [address] [place], [place] [address]   ABO/Rh     Status: None   Collection Time: 03/13/18  4:20 PM  Result Value Ref Range   ABO/RH(D)      O POS Performed at [facility], [address] [place], [place] [address]   Ethanol     Status: Abnormal   Collection Time: 03/13/18  4:25 PM  Result Value Ref Range   Alcohol, Ethyl (B) 88 (H) <10 mg/dL    Comment: (NOTE) Lowest detectable limit for serum alcohol is 10 mg/dL. For medical purposes only. Performed at [facility], [address], [place], [place] [address]   Protime-INR     Status: Abnormal   Collection Time: 03/13/18  6:30 PM  Result Value Ref Range   Prothrombin Time 21.3 (H) 11.4 - 15.2 seconds   INR 1.9 (H) 0.8 - 1.2    Comment: (NOTE) INR goal varies based on device and disease states. Performed at  [facility], [address] [place],  [place] [address]    Ct Abdomen Pelvis W Contrast  Result Date: 03/13/2018 CLINICAL DATA:  Abdominal pain and distention with leg swelling for 3 days, history of alcohol use disorder EXAM: CT ABDOMEN AND PELVIS WITH CONTRAST TECHNIQUE: Multidetector CT imaging of the abdomen and pelvis was performed using the standard protocol following bolus administration of intravenous contrast. Sagittal and coronal MPR images reconstructed from axial data set. CONTRAST:  OMNIPAQUE IOHEXOL 300 MG/ML SOLN IV. No oral contrast. COMPARISON:  03/11/2016 FINDINGS: Lower chest: Tiny LEFT pleural effusion. Subsegmental atelectasis BILATERAL lower lobes Hepatobiliary: Thickened gallbladder wall, nonspecific in the setting of ascites. Fatty infiltration of liver with mild diffuse hepatic inhomogeneity. No discrete hepatic mass. Pancreas: Normal appearance Spleen: Normal appearance.  12.3 cm length. Adrenals/Urinary Tract: Adrenal glands, kidneys, ureters, and bladder normal appearance Stomach/Bowel: Normal appendix. Stomach and bowel loops grossly unremarkable for technique Vascular/Lymphatic: Vascular structures grossly patent. No adenopathy. Reproductive: Unremarkable prostate gland and seminal vesicles Other: Large volume ascites.  Scattered soft tissue edema. Musculoskeletal: No acute osseous findings. IMPRESSION: Large volume ascites and scattered soft tissue edema. Fatty infiltration of liver with mild diffuse hepatic inhomogeneity; no definite hepatic nodularity is seen to suggest cirrhosis though this should be clinically considered. Electronically Signed   By: Ulyses Southward M.D.   On: 03/13/2018 18:04    Pending Labs Unresulted Labs (From admission, onward)    Start     Ordered   03/14/18 0500  CBC  Tomorrow morning,   R     03/13/18 2029   03/14/18 0500  Comprehensive metabolic panel  Tomorrow morning,   R     03/13/18 2029   03/13/18 2029  HIV antibody (Routine Testing)  Once,   R     03/13/18 2029   03/13/18 1406   Urinalysis, Routine w reflex microscopic  ONCE - STAT,   STAT     03/13/18 1406          Vitals/Pain Today's Vitals   03/13/18 2000 03/13/18 2111 03/13/18 2115 03/13/18 2130  BP: 127/85 121/73 121/73 126/70  Pulse: (!) 101 (!) 104 (!) 101 (!) 103  Resp: 19 18 18 18   Temp:  98.9 F (37.2 C)    TempSrc:  Oral    SpO2: 98%  100% 99%  Weight:      Height:      PainSc:        Isolation Precautions No active isolations  Medications Medications  pantoprazole (PROTONIX) 80 mg in sodium chloride 0.9 % 250 mL (0.32 mg/mL) infusion (8 mg/hr Intravenous New Bag/Given 03/13/18 1859)  LORazepam (ATIVAN) injection 0-4 mg (2 mg Intravenous Given 03/13/18 1825)    Or  LORazepam (ATIVAN) tablet 0-4 mg ( Oral See Alternative 03/13/18 1825)  LORazepam (ATIVAN) injection 0-4 mg (has no administration in time range)    Or  LORazepam (ATIVAN) tablet 0-4 mg (has no administration in time range)  thiamine (VITAMIN B-1) tablet 100 mg ( Oral See Alternative 03/13/18 1828)    Or  thiamine (B-1) injection 100 mg (100 mg Intravenous Given 03/13/18 1828)  acetaminophen (TYLENOL) tablet 650 mg (has no administration in time range)    Or  acetaminophen (TYLENOL) suppository 650 mg (has no administration in time range)  ondansetron (ZOFRAN) tablet 4 mg (has no administration in time range)    Or  ondansetron (ZOFRAN) injection 4 mg (has no administration in time range)  sodium chloride flush (NS) 0.9 %  injection 3 mL (3 mLs Intravenous Given 03/13/18 1637)  pantoprazole (PROTONIX) injection 40 mg (40 mg Intravenous Given 03/13/18 1646)  0.9 %  sodium chloride infusion (10 mL/hr Intravenous New Bag/Given 03/13/18 1647)  iohexol (OMNIPAQUE) 300 MG/ML solution 100 mL (100 mLs Intravenous Contrast Given 03/13/18 1732)    Mobility walks Low fall risk   Focused Assessments Hepatic, anemia, ciwa q 6 hr   R Recommendations: See Admitting Provider Note  Report given to:   Additional Notes: Pt frequents ER past 3  years with ETOH abuse.  Pleasant, cooperative, A/o x 4.  HGB 6.8 - now on 2nd and final unit of PRBCs. Speaks spanish and needs interpreter, however has been explained in depth with EDP plan of care.  Family member at bedside able to translate as well. CIWA q 6 hr (last was 16, given 2 mg IV ativan at 1830). Distended belly.   Plan of care: Admit with GI consult, NPO p MN (possible scope in AM to find source of bleeding).

## 2018-03-13 NOTE — Progress Notes (Signed)
Pt has to 5 west 20, accompanied by his brother. Pt is spanish speaker, translator used to communicate with pt. Unit of RBC transfusing, pt identified appropriately, alert and oriented x 4. VS stable, denied any pain, no signs of acute distress.  Cardiac monitor placed on pt and CCMD notified.  Pt instructed to call for assistance any time needed and with ambulation. He verbalized understanding and call bell left within reach. Pt oriented to room and equipment, floor mats and bed alarm in place.

## 2018-03-13 NOTE — ED Notes (Signed)
1st attempt to call report to 5w, RN in patient room and will call back.

## 2018-03-13 NOTE — ED Provider Notes (Signed)
[facility] [name] EMERGENCY DEPARTMENT Provider Note   CSN: [id number] Arrival date & time: 03/13/18  1336    History   Chief Complaint Chief Complaint  Patient presents with  . Abdominal Pain    HPI Mathew [name] is a 38 y.o. male medical history of alcohol abuse, hypokalemia, anemia who presents for evaluation worsening right upper quadrant abdominal pain.  He states that he has had this pain intermittently for the last several months.  He states that last night, the pain became more constant and more severe.  He describes it as a "aching type sensation."  Not taking any medications for the pain.  Patient also reports that he has had progressively worsening swelling in his stomach and bilateral lower extremities.  He states he has also been spitting up blood.  He does report this is been ongoing for about a year but now reports that every time he spits up, there is some blood in it.  Patient reports he has also noticed some darker colors when he spits up but nothing that looks like coffee ground emesis.  Patient states that when he sleeps at night, he will have blood on the pillow which has been new in the last few weeks.  States he is not on any blood thinners.  He does report extensive alcohol use and states that he will normally drink four 40 oz beers a day.  Patient denies any NSAID use, smoking.  Patient does not have any chest pain.  He states that when his abdominal swelling gets worse, he feels like it is pushing up and sometimes makes it harder to breathe but otherwise denies any shortness of breath.  He has not been evaluated for these symptoms before.  Patient denies any vomiting, fever, dysuria, hematuria, blood in stools.      The history is provided by the patient and a relative. A language interpreter was used.    History reviewed. No pertinent past medical history.  Patient Active Problem List   Diagnosis Date Noted  . Alcohol use disorder 07/09/2016  .  Hypokalemia 07/09/2016  . Anemia 07/09/2016  . Thrombocytopenia (HCC) 07/09/2016  . Acute encephalopathy 07/09/2016  . Seizure due to alcohol withdrawal, with unspecified complication (HCC) 07/09/2016    History reviewed. No pertinent surgical history.      Home Medications    Prior to Admission medications   Medication Sig Start Date End Date Taking? Authorizing Provider  CALCIUM PO Take 1 tablet by mouth daily.   Yes [provider]  LORazepam (ATIVAN) 2 MG tablet Take one tablet (2 mg) every hour so long as symptoms of delirium tremens are present. Do not exceed 10 mg per day. Patient not taking: Reported on 03/13/2018 11/11/16   Loletta Specter, PA-C  naltrexone (DEPADE) 50 MG tablet Take 1 tablet (50 mg total) by mouth daily. Patient not taking: Reported on 03/13/2018 11/11/16   Loletta Specter, PA-C  thiamine 100 MG tablet Take 1 tablet (100 mg total) by mouth 3 (three) times daily. Patient not taking: Reported on 03/13/2018 10/21/16   Loletta Specter, PA-C    Family History History reviewed. No pertinent family history.  Social History Social History   Tobacco Use  . Smoking status: Never Smoker  . Smokeless tobacco: Never Used  Substance Use Topics  . Alcohol use: Yes    Comment: daily   . Drug use: No     Allergies   Patient has no known allergies.  Review of Systems Review of Systems  Constitutional: Negative for fever.  Respiratory: Negative for cough and shortness of breath.   Cardiovascular: Negative for chest pain.  Gastrointestinal: Positive for abdominal distention and abdominal pain. Negative for blood in stool, nausea and vomiting.  Genitourinary: Negative for dysuria and hematuria.  Neurological: Negative for headaches.  All other systems reviewed and are negative.    Physical Exam Updated Vital Signs BP 136/76   Pulse 92   Temp 98.9 F (37.2 C) (Oral)   Resp 16   Ht 5\' 5"  (1.651 m)   Wt 77.1 kg   SpO2 98%   BMI 28.29  kg/m   Physical Exam Vitals signs and nursing note reviewed. Exam conducted with a chaperone present.  Constitutional:      Appearance: Normal appearance. He is well-developed.  HENT:     Head: Normocephalic and atraumatic.  Eyes:     General: Lids are normal.     Conjunctiva/sclera: Conjunctivae normal.     Pupils: Pupils are equal, round, and reactive to light.  Neck:     Musculoskeletal: Full passive range of motion without pain.  Cardiovascular:     Rate and Rhythm: Normal rate and regular rhythm.     Pulses: Normal pulses.          Radial pulses are 2+ on the right side.       Dorsalis pedis pulses are 2+ on the right side and 2+ on the left side.     Heart sounds: Normal heart sounds. No murmur. No friction rub. No gallop.   Pulmonary:     Effort: Pulmonary effort is normal.     Breath sounds: Normal breath sounds.     Comments: Lungs clear to auscultation bilaterally.  Symmetric chest rise.  No wheezing, rales, rhonchi. Abdominal:     General: There is distension.     Palpations: Abdomen is not rigid.     Tenderness: There is abdominal tenderness in the right upper quadrant. There is no right CVA tenderness, left CVA tenderness or guarding.     Comments: Abdomen is distended with increased tympany.  Tenderness palpation noted to right upper quadrant.  No CVA tenderness bilaterally.  Genitourinary:    Penis: Normal. No swelling.      Scrotum/Testes: Normal.        Right: Tenderness or swelling not present.        Left: Tenderness or swelling not present.     Rectum: Normal.     Comments: The exam was performed with a chaperone present. Normal male genitalia. No evidence of rash, ulcers or lesions.  Digital Rectal Exam reveals sphincter with good tone. No external hemorrhoids. No masses or fissures. Stool color is brown with no overt blood. Musculoskeletal: Normal range of motion.     Comments: Plus pitting edema that begins at the bilateral proximal tib-fib's and extends  distally.  No overlying warmth, erythema.  Skin:    General: Skin is warm and dry.     Capillary Refill: Capillary refill takes less than 2 seconds.  Neurological:     Mental Status: He is alert and oriented to person, place, and time.  Psychiatric:        Speech: Speech normal.      ED Treatments / Results  Labs (all labs ordered are listed, but only abnormal results are displayed) Labs Reviewed  COMPREHENSIVE METABOLIC PANEL - Abnormal; Notable for the following components:      Result Value   Potassium  3.4 (*)    BUN <5 (*)    Creatinine, Ser 0.51 (*)    Calcium 7.5 (*)    Total Protein 6.2 (*)    Albumin 2.5 (*)    AST 56 (*)    Total Bilirubin 1.9 (*)    All other components within normal limits  CBC - Abnormal; Notable for the following components:   WBC 3.5 (*)    RBC 3.05 (*)    Hemoglobin 6.3 (*)    HCT 22.9 (*)    MCV 75.1 (*)    MCH 20.7 (*)    MCHC 27.5 (*)    RDW 21.1 (*)    Platelets 70 (*)    All other components within normal limits  ETHANOL - Abnormal; Notable for the following components:   Alcohol, Ethyl (B) 88 (*)    All other components within normal limits  LIPASE, BLOOD  URINALYSIS, ROUTINE W REFLEX MICROSCOPIC  PROTIME-INR  POC OCCULT BLOOD, ED  TYPE AND SCREEN  PREPARE RBC (CROSSMATCH)  ABO/RH    EKG None  Radiology Ct Abdomen Pelvis W Contrast  Result Date: 03/13/2018 CLINICAL DATA:  Abdominal pain and distention with leg swelling for 3 days, history of alcohol use disorder EXAM: CT ABDOMEN AND PELVIS WITH CONTRAST TECHNIQUE: Multidetector CT imaging of the abdomen and pelvis was performed using the standard protocol following bolus administration of intravenous contrast. Sagittal and coronal MPR images reconstructed from axial data set. CONTRAST:  100mL OMNIPAQUE IOHEXOL 300 MG/ML SOLN IV. No oral contrast. COMPARISON:  03/11/2016 FINDINGS: Lower chest: Tiny LEFT pleural effusion. Subsegmental atelectasis BILATERAL lower lobes  Hepatobiliary: Thickened gallbladder wall, nonspecific in the setting of ascites. Fatty infiltration of liver with mild diffuse hepatic inhomogeneity. No discrete hepatic mass. Pancreas: Normal appearance Spleen: Normal appearance.  12.3 cm length. Adrenals/Urinary Tract: Adrenal glands, kidneys, ureters, and bladder normal appearance Stomach/Bowel: Normal appendix. Stomach and bowel loops grossly unremarkable for technique Vascular/Lymphatic: Vascular structures grossly patent. No adenopathy. Reproductive: Unremarkable prostate gland and seminal vesicles Other: Large volume ascites.  Scattered soft tissue edema. Musculoskeletal: No acute osseous findings. IMPRESSION: Large volume ascites and scattered soft tissue edema. Fatty infiltration of liver with mild diffuse hepatic inhomogeneity; no definite hepatic nodularity is seen to suggest cirrhosis though this should be clinically considered. Electronically Signed   By: Ulyses SouthwardMark  Boles M.D.   On: 03/13/2018 18:04    Procedures .Critical Care Performed by: Maxwell CaulLayden, Tujuana Kilmartin A, PA-C Authorized by: Maxwell CaulLayden, Burna Atlas A, PA-C   Critical care provider statement:    Critical care time (minutes):  45   Critical care was necessary to treat or prevent imminent or life-threatening deterioration of the following conditions:  Circulatory failure   Critical care was time spent personally by me on the following activities:  Discussions with consultants, evaluation of patient's response to treatment, examination of patient, ordering and performing treatments and interventions, ordering and review of laboratory studies, ordering and review of radiographic studies, pulse oximetry, re-evaluation of patient's condition, obtaining history from patient or surrogate and review of old charts   (including critical care time)  Medications Ordered in ED Medications  pantoprazole (PROTONIX) 80 mg in sodium chloride 0.9 % 250 mL (0.32 mg/mL) infusion (has no administration in time  range)  LORazepam (ATIVAN) injection 0-4 mg (has no administration in time range)    Or  LORazepam (ATIVAN) tablet 0-4 mg (has no administration in time range)  LORazepam (ATIVAN) injection 0-4 mg (has no administration in time range)  Or  LORazepam (ATIVAN) tablet 0-4 mg (has no administration in time range)  thiamine (VITAMIN B-1) tablet 100 mg (has no administration in time range)    Or  thiamine (B-1) injection 100 mg (has no administration in time range)  sodium chloride flush (NS) 0.9 % injection 3 mL (3 mLs Intravenous Given 03/13/18 1637)  pantoprazole (PROTONIX) injection 40 mg (40 mg Intravenous Given 03/13/18 1646)  0.9 %  sodium chloride infusion (10 mL/hr Intravenous New Bag/Given 03/13/18 1647)  iohexol (OMNIPAQUE) 300 MG/ML solution 100 mL (100 mLs Intravenous Contrast Given 03/13/18 1732)     Initial Impression / Assessment and Plan / ED Course  I have reviewed the triage vital signs and the nursing notes.  Pertinent labs & imaging results that were available during my care of the patient were reviewed by me and considered in my medical decision making (see chart for details).        38 year old male past medical history of alcohol use, anemia, hypokalemia who presents today for evaluation of worsening right upper quadrant abdominal pain, worsening abdominal and leg swelling, spitting up blood.  No fevers, chest pain.  No shortness of breath at this time. Patient is afebrile, non-toxic appearing, sitting comfortably on examination table. Vital signs reviewed and stable.  On exam, patient with abdominal distention increase tympany.  He also has 2+ pitting edema noted bilateral lower extremities.  DRE showed no evidence of melena.  Concern for gastritis vs Upper GI bleed secondary to alcohol use with additional ascites. Plan for labs.   CBC shows leukopenia of 3.5, hemoglobin of 6.3, hematocrit of 22.9.  Patient also with thrombocytopenia at 70.  Review of records show that this  is consistent with previous though his anemia is lower than it normally is.  CMP shows potassium of 3.4.  Albumin is 2.5.  Lipase is unremarkable. Ethanol is 88.   Given anemia likely secondary to GI bleed, will require admission. Plan for transfusion here in the ED.   CT abd/pelvis shows large volume ascites and scattered soft tissue edema.   Discussed patient with Dr. Ronney Asters.  He is with management plan here in the ED and admission.  We will plan to see patient in consultation tomorrow for he will likely need endoscopy.  Discussed patient with Dr. Anthoney Harada (hospitalist) who accepts patient for admission.   Final Clinical Impressions(s) / ED Diagnoses   Final diagnoses:  Upper GI bleed  Anemia, unspecified type    ED Discharge Orders    None       Rosana Hoes 03/13/18 2018    Charlynne Pander, MD 03/13/18 2139

## 2018-03-13 NOTE — H&P (Signed)
History and Physical    Mathew [name] ZOX:[id number] DOB: Apr 25, 1980 DOA: 03/13/2018  PCP: Patient, No Pcp Per  Patient coming from: Home  I have personally briefly reviewed patient's old medical records in [facility]  Chief Complaint: Abd pain  HPI: Mathew [name] is a 38 y.o. male with medical history significant of EtOH abuse.  Patient presents to the ED for evaluation of worsening right upper quadrant abdominal pain.  He states that he has had this pain intermittently for the last several months.  He states that last night, the pain became more constant and more severe.  He describes it as a "aching type sensation."  Not taking any medications for the pain. Patient also reports that he has had progressively worsening swelling in his stomach and bilateral lower extremities.  Additionally, patient states he has also been spitting up blood.  He does report this is been ongoing for about a year but now reports that every time he spits up, there is some blood in it.  Admits to extensive EtOH use, no NSAID use.   ED Course: HGB 6.3 down from 7 last week and 10 baseline.  Chronic thrombocytopenia.  Hemoccult negative and no vomiting episodes in ED.  CT reveals large volume ascites and fatty infiltration of liver.  INR 1.9.  EDP spoke with Dr. Matthias Hughs who recommended PPI GTT, he will see in AM for EGD.   Review of Systems: As per HPI otherwise 10 point review of systems negative.   Past Medical History:  Diagnosis Date  . Alcohol withdrawal seizure (HCC)   . ETOH abuse     History reviewed. No pertinent surgical history.   reports that he has never smoked. He has never used smokeless tobacco. He reports current alcohol use. He reports that he does not use drugs.  No Known Allergies  History reviewed. No pertinent family history. No sick contacts in family  Prior to Admission medications   Medication Sig Start Date End Date Taking? Authorizing Provider  CALCIUM PO Take 1 tablet by  mouth daily.   Yes [provider]  LORazepam (ATIVAN) 2 MG tablet Take one tablet (2 mg) every hour so long as symptoms of delirium tremens are present. Do not exceed 10 mg per day. Patient not taking: Reported on 03/13/2018 11/11/16   Loletta Specter, PA-C  naltrexone (DEPADE) 50 MG tablet Take 1 tablet (50 mg total) by mouth daily. Patient not taking: Reported on 03/13/2018 11/11/16   Loletta Specter, PA-C  thiamine 100 MG tablet Take 1 tablet (100 mg total) by mouth 3 (three) times daily. Patient not taking: Reported on 03/13/2018 10/21/16   Loletta Specter, PA-C    Physical Exam: Vitals:   03/13/18 1810 03/13/18 1815 03/13/18 1821 03/13/18 1830  BP:  136/86 136/86 108/64  Pulse:  92 93 (!) 107  Resp:  18  19  Temp:      TempSrc:      SpO2:  100%  100%  Weight: 77.1 kg     Height:  (1.651 m)       Constitutional: NAD, calm, comfortable Eyes: PERRL, lids and conjunctivae normal ENMT: Mucous membranes are moist. Posterior pharynx clear of any exudate or lesions.Normal dentition.  Neck: normal, supple, no masses, no thyromegaly Respiratory: clear to auscultation bilaterally, no wheezing, no crackles. Normal respiratory effort. No accessory muscle use.  Cardiovascular: Regular rate and rhythm, no murmurs / rubs / gallops. No extremity edema. 2+ pedal pulses. No carotid  bruits.  Abdomen: Distended, RUQ TTP  Musculoskeletal: no clubbing / cyanosis. No joint deformity upper and lower extremities. Good ROM, no contractures. Normal muscle tone.  Skin: no rashes, lesions, ulcers. No induration Neurologic: CN 2-12 grossly intact. Sensation intact, DTR normal. Strength 5/5 in all 4.  Psychiatric: Normal judgment and insight. Alert and oriented x 3. Normal mood.    Labs on Admission: I have personally reviewed following labs and imaging studies  CBC: Recent Labs  Lab 03/13/18 1415  WBC 3.5*  HGB 6.3*  HCT 22.9*  MCV 75.1*  PLT 70*   Basic Metabolic Panel: Recent  Labs  Lab 03/13/18 1415  NA 137  K 3.4*  CL 109  CO2 22  GLUCOSE 99  BUN <5*  CREATININE 0.51*  CALCIUM 7.5*   GFR: Estimated Creatinine Clearance: 118.7 mL/min (A) (by C-G formula based on SCr of 0.51 mg/dL (L)). Liver Function Tests: Recent Labs  Lab 03/13/18 1415  AST 56*  ALT 24  ALKPHOS 109  BILITOT 1.9*  PROT 6.2*  ALBUMIN 2.5*   Recent Labs  Lab 03/13/18 1415  LIPASE 39   No results for input(s): AMMONIA in the last 168 hours. Coagulation Profile: Recent Labs  Lab 03/13/18 1830  INR 1.9*   Cardiac Enzymes: No results for input(s): CKTOTAL, CKMB, CKMBINDEX, TROPONINI in the last 168 hours. BNP (last 3 results) No results for input(s): PROBNP in the last 8760 hours. HbA1C: No results for input(s): HGBA1C in the last 72 hours. CBG: No results for input(s): GLUCAP in the last 168 hours. Lipid Profile: No results for input(s): CHOL, HDL, LDLCALC, TRIG, CHOLHDL, LDLDIRECT in the last 72 hours. Thyroid Function Tests: No results for input(s): TSH, T4TOTAL, FREET4, T3FREE, THYROIDAB in the last 72 hours. Anemia Panel: No results for input(s): VITAMINB12, FOLATE, FERRITIN, TIBC, IRON, RETICCTPCT in the last 72 hours. Urine analysis:    Component Value Date/Time   COLORURINE STRAW (A) 10/11/2017 1620   APPEARANCEUR CLEAR 10/11/2017 1620   LABSPEC 1.005 10/11/2017 1620   PHURINE 5.0 10/11/2017 1620   GLUCOSEU NEGATIVE 10/11/2017 1620   HGBUR NEGATIVE 10/11/2017 1620   BILIRUBINUR NEGATIVE 10/11/2017 1620   KETONESUR NEGATIVE 10/11/2017 1620   PROTEINUR NEGATIVE 10/11/2017 1620   UROBILINOGEN 0.2 08/02/2014 2045   NITRITE NEGATIVE 10/11/2017 1620   LEUKOCYTESUR NEGATIVE 10/11/2017 1620    Radiological Exams on Admission: Ct Abdomen Pelvis W Contrast  Result Date: 03/13/2018 CLINICAL DATA:  Abdominal pain and distention with leg swelling for 3 days, history of alcohol use disorder EXAM: CT ABDOMEN AND PELVIS WITH CONTRAST TECHNIQUE: Multidetector CT  imaging of the abdomen and pelvis was performed using the standard protocol following bolus administration of intravenous contrast. Sagittal and coronal MPR images reconstructed from axial data set. CONTRAST:  OMNIPAQUE IOHEXOL 300 MG/ML SOLN IV. No oral contrast. COMPARISON:  03/11/2016 FINDINGS: Lower chest: Tiny LEFT pleural effusion. Subsegmental atelectasis BILATERAL lower lobes Hepatobiliary: Thickened gallbladder wall, nonspecific in the setting of ascites. Fatty infiltration of liver with mild diffuse hepatic inhomogeneity. No discrete hepatic mass. Pancreas: Normal appearance Spleen: Normal appearance.  12.3 cm length. Adrenals/Urinary Tract: Adrenal glands, kidneys, ureters, and bladder normal appearance Stomach/Bowel: Normal appendix. Stomach and bowel loops grossly unremarkable for technique Vascular/Lymphatic: Vascular structures grossly patent. No adenopathy. Reproductive: Unremarkable prostate gland and seminal vesicles Other: Large volume ascites.  Scattered soft tissue edema. Musculoskeletal: No acute osseous findings. IMPRESSION: Large volume ascites and scattered soft tissue edema. Fatty infiltration of liver with mild diffuse hepatic inhomogeneity;  no definite hepatic nodularity is seen to suggest cirrhosis though this should be clinically considered. Electronically Signed   By: Ulyses Southward M.D.   On: 03/13/2018 18:04    EKG: Independently reviewed.  Assessment/Plan Principal Problem:   Hematemesis Active Problems:   ETOH abuse   Thrombocytopenia (HCC)   Acute blood loss anemia   Alcoholic cirrhosis of liver with ascites (HCC)    1. Hematemesis with anemia - 1. 2u PRBC transfusion 2. No obvious active bleeding in ED at the moment, hemoccult neg 3. Repeat CBC in AM 4. PPI gtt 5. Will hold off on octreotide and ABx for the moment. 1. Though would start these if patient develops obvious active bleeding stigmata, or signs of SBP. 2. Also if he develops active bleed would  transfuse FFP for INR 1.9. 6. NPO after MN 7. EGD likely in AM 8. Tele monitor 2. Ascites - 1. Suspect underlying EtOH liver disease / cirrhosis as cause 2. US paracentesis ordered for tomorrow 3. Thrombocytopenia - 1. Chronic 2. Likely secondary to #2 4. EtOH abuse - 1. CIWA  DVT prophylaxis: SCDs Code Status: Full Family Communication: Family at bedside Disposition Plan: Home after admit Consults called: Dr. Matthias Hughs Admission status: Admit to inpatient  Severity of Illness: The appropriate patient status for this patient is INPATIENT. Inpatient status is judged to be reasonable and necessary in order to provide the required intensity of service to ensure the patient's safety. The patient's presenting symptoms, physical exam findings, and initial radiographic and laboratory data in the context of their chronic comorbidities is felt to place them at high risk for further clinical deterioration. Furthermore, it is not anticipated that the patient will be medically stable for discharge from the hospital within 2 midnights of admission. The following factors support the patient status of inpatient.   " The patient's presenting symptoms include Hematemesis, abd swelling. " The worrisome physical exam findings include Abd swelling. " The initial radiographic and laboratory data are worrisome because of Ascites, possible cirrhosis, INR 1.9, HGB 6.3 down from 7 last week and 10 baseline. " The chronic co-morbidities include EtOH abuse.   * I certify that at the point of admission it is my clinical judgment that the patient will require inpatient hospital care spanning beyond 2 midnights from the point of admission due to high intensity of service, high risk for further deterioration and high frequency of surveillance required.*    Dixon Luczak M. DO [facility]  How to contact the [facility] Attending or Consulting provider 7A - 7P or covering provider during after hours 7P -7A, for  this patient?  1. Check the care team in [facility] and look for a) attending/consulting [facility] provider listed and b) the [facility] team listed 2. Log into [email or web address]  Amion Physician Scheduling and messaging for groups and whole hospitals  On call and physician scheduling software for group practices, residents, hospitalists and other medical providers for call, clinic, rotation and shift schedules. On[employer] is a hospital-wide system for scheduling doctors and paging doctors on call. EasyPlot is for scientific plotting and data analysis.  [email or web address]  and use [facility]'s universal password to access. If you do not have the password, please contact the hospital operator.  3. Locate the [facility] provider you are looking for under [facility] and page to a number that you can be directly reached. 4. If you still have difficulty reaching the provider, please page the [facility] (Director on Call) for the Hospitalists listed on amion  for assistance.  03/13/2018, 8:29 PM

## 2018-03-14 ENCOUNTER — Inpatient Hospital Stay (HOSPITAL_COMMUNITY): Payer: Self-pay

## 2018-03-14 ENCOUNTER — Encounter (HOSPITAL_COMMUNITY): Payer: Self-pay | Admitting: Diagnostic Radiology

## 2018-03-14 HISTORY — PX: IR PARACENTESIS: IMG2679

## 2018-03-14 LAB — URINALYSIS, ROUTINE W REFLEX MICROSCOPIC
Bilirubin Urine: NEGATIVE
GLUCOSE, UA: NEGATIVE mg/dL
Hgb urine dipstick: NEGATIVE
Ketones, ur: NEGATIVE mg/dL
Leukocytes,Ua: NEGATIVE
Nitrite: NEGATIVE
PH: 6 (ref 5.0–8.0)
Protein, ur: NEGATIVE mg/dL
Specific Gravity, Urine: >1.046 — ABNORMAL HIGH (ref 1.005–1.030)

## 2018-03-14 LAB — CBC
HCT: 25.3 % — ABNORMAL LOW (ref 39.0–52.0)
HCT: 25.3 % — ABNORMAL LOW (ref 39.0–52.0)
Hemoglobin: 7.6 g/dL — ABNORMAL LOW (ref 13.0–17.0)
Hemoglobin: 7.4 g/dL — ABNORMAL LOW (ref 13.0–17.0)
MCH: 22 pg — ABNORMAL LOW (ref 26.0–34.0)
MCH: 21.4 pg — AB (ref 26.0–34.0)
MCHC: 30 g/dL (ref 30.0–36.0)
MCHC: 29.2 g/dL — ABNORMAL LOW (ref 30.0–36.0)
MCV: 73.1 fL — ABNORMAL LOW (ref 80.0–100.0)
MCV: 73.3 fL — ABNORMAL LOW (ref 80.0–100.0)
NRBC: 0.8 % — AB (ref 0.0–0.2)
Platelets: 64 10*3/uL — ABNORMAL LOW (ref 150–400)
Platelets: 62 10*3/uL — ABNORMAL LOW (ref 150–400)
RBC: 3.46 MIL/uL — ABNORMAL LOW (ref 4.22–5.81)
RBC: 3.45 MIL/uL — ABNORMAL LOW (ref 4.22–5.81)
RDW: 18.7 % — AB (ref 11.5–15.5)
RDW: 19 % — ABNORMAL HIGH (ref 11.5–15.5)
WBC: 2.6 10*3/uL — AB (ref 4.0–10.5)
WBC: 2.4 10*3/uL — ABNORMAL LOW (ref 4.0–10.5)
nRBC: 0 % (ref 0.0–0.2)

## 2018-03-14 LAB — GRAM STAIN

## 2018-03-14 LAB — BODY FLUID CELL COUNT WITH DIFFERENTIAL
Lymphs, Fluid: 33 %
Monocyte-Macrophage-Serous Fluid: 36 % — ABNORMAL LOW (ref 50–90)
Neutrophil Count, Fluid: 31 % — ABNORMAL HIGH (ref 0–25)
Total Nucleated Cell Count, Fluid: 89 cu mm (ref 0–1000)

## 2018-03-14 LAB — COMPREHENSIVE METABOLIC PANEL
ALT: 20 U/L (ref 0–44)
AST: 47 U/L — ABNORMAL HIGH (ref 15–41)
Albumin: 2.3 g/dL — ABNORMAL LOW (ref 3.5–5.0)
Alkaline Phosphatase: 89 U/L (ref 38–126)
Anion gap: 5 (ref 5–15)
BILIRUBIN TOTAL: 4.6 mg/dL — AB (ref 0.3–1.2)
BUN: 5 mg/dL — AB (ref 6–20)
CO2: 22 mmol/L (ref 22–32)
Calcium: 7.4 mg/dL — ABNORMAL LOW (ref 8.9–10.3)
Chloride: 109 mmol/L (ref 98–111)
Creatinine, Ser: 0.57 mg/dL — ABNORMAL LOW (ref 0.61–1.24)
GFR calc Af Amer: >60 mL/min (ref >60)
GFR calc non Af Amer: >60 mL/min (ref >60)
GLUCOSE: 81 mg/dL (ref 70–99)
Potassium: 3.2 mmol/L — ABNORMAL LOW (ref 3.5–5.1)
Sodium: 136 mmol/L (ref 135–145)
Total Protein: 5.5 g/dL — ABNORMAL LOW (ref 6.5–8.1)

## 2018-03-14 LAB — PREPARE RBC (CROSSMATCH)

## 2018-03-14 LAB — HIV ANTIBODY (ROUTINE TESTING W REFLEX): HIV Screen 4th Generation wRfx: NONREACTIVE

## 2018-03-14 LAB — PROTEIN, PLEURAL OR PERITONEAL FLUID: Total protein, fluid: <3 g/dL

## 2018-03-14 MED ORDER — POTASSIUM CHLORIDE 10 MEQ/100ML IV SOLN
10.0000 meq | INTRAVENOUS | Status: AC
  Administered 2018-03-14 (×2): 10 meq via INTRAVENOUS
  Filled 2018-03-14: qty 100

## 2018-03-14 MED ORDER — FOLIC ACID 1 MG PO TABS
1.0000 mg | ORAL_TABLET | Freq: Every day | ORAL | Status: DC
  Administered 2018-03-15 – 2018-03-17 (×3): 1 mg via ORAL
  Filled 2018-03-14 (×3): qty 1

## 2018-03-14 MED ORDER — POTASSIUM CHLORIDE 10 MEQ/100ML IV SOLN
INTRAVENOUS | Status: AC
  Filled 2018-03-14: qty 100

## 2018-03-14 MED ORDER — LIDOCAINE HCL 1 % IJ SOLN
INTRAMUSCULAR | Status: AC | PRN
  Administered 2018-03-14: 10 mL

## 2018-03-14 MED ORDER — DIPHENHYDRAMINE HCL 50 MG/ML IJ SOLN
25.0000 mg | Freq: Once | INTRAMUSCULAR | Status: AC
  Administered 2018-03-14: 25 mg via INTRAVENOUS
  Filled 2018-03-14: qty 1

## 2018-03-14 MED ORDER — SODIUM CHLORIDE 0.9% IV SOLUTION
Freq: Once | INTRAVENOUS | Status: AC
  Administered 2018-03-14: 250 mL via INTRAVENOUS

## 2018-03-14 MED ORDER — FUROSEMIDE 10 MG/ML IJ SOLN
20.0000 mg | Freq: Once | INTRAMUSCULAR | Status: AC
  Administered 2018-03-15: 20 mg via INTRAVENOUS
  Filled 2018-03-14: qty 2

## 2018-03-14 MED ORDER — SODIUM CHLORIDE 0.9 % IV SOLN
1.0000 g | INTRAVENOUS | Status: DC
  Administered 2018-03-14 – 2018-03-16 (×3): 1 g via INTRAVENOUS
  Filled 2018-03-14 (×3): qty 10

## 2018-03-14 MED ORDER — ACETAMINOPHEN 325 MG PO TABS
650.0000 mg | ORAL_TABLET | Freq: Once | ORAL | Status: AC
  Administered 2018-03-14: 650 mg via ORAL
  Filled 2018-03-14: qty 2

## 2018-03-14 MED ORDER — LIDOCAINE HCL 1 % IJ SOLN
INTRAMUSCULAR | Status: AC
  Filled 2018-03-14: qty 20

## 2018-03-14 NOTE — Progress Notes (Addendum)
Patient back from IR; alert and oriented x 4, no acute distress noted, no complaints. Patient verbalized that he is feeling much better. Dressing on RLQ intact, dry, clean. Will contiue to monitor.

## 2018-03-14 NOTE — Progress Notes (Signed)
PROGRESS NOTE        PATIENT DETAILS Name: Mathew [name] Age: 38 y.o. Sex: male Date of Birth: 1980/03/23 Admit Date: 03/13/2018 Admitting Physician Hillary Bow, DO NLG:[id number], No Pcp Per  Brief Narrative: Patient is a 38 y.o. male with history of alcohol use presenting with abdominal pain and hematemesis, found to have acute blood loss anemia.  See below for further details  Subjective: Denies any abdominal pain-abdomen is soft.  No hematemesis overnight.  Assessment/Plan: Upper GI bleeding with acute blood loss anemia: Further hematemesis overnight, continue PPI infusion-await GI evaluation regarding timing of EGD.  Apart from ascites no stigmata of cirrhosis.  Hold off on starting octreotide till EGD has been completed as GI bleeding seems to have resolved.  Hemoglobin at 7.6 after 2 units of PRBC-suspect requires another unit of PRBC.  Continue to follow CBC.  EtOH abuse: Last drink on 3/3-mildly tremulous-continue Ativan per protocol.  Encouraged/counseled EtOH abstinence.  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  Follow.  ?  Liver cirrhosis: Longstanding history of EtOH use-has some mild ascites by exam-also coagulopathic, thrombocytopenic with hypoalbuminemia-suspect may have developed cirrhosis.  Have encouraged complete alcohol cessation.  Will obtain RUQ ultrasound to see if he has liver morphology consistent with cirrhosis.    DVT Prophylaxis: SCD's  Code Status: Full code   Family Communication: None at bedside  Disposition Plan: Remain inpatient  Antimicrobial agents: Anti-infectives (From admission, onward)   Start     Dose/Rate Route Frequency Ordered Stop   03/14/18 1230  cefTRIAXone (ROCEPHIN) 1 g in sodium chloride 0.9 % 100 mL IVPB     1 g 200 mL/hr over 30 Minutes Intravenous Every 24 hours 03/14/18 1123        Procedures: None  CONSULTS:  GI  Time spent: 25-  minutes-Greater than 50% of this time was spent in counseling, explanation of diagnosis, planning of further management, and coordination of care.  MEDICATIONS: Scheduled Meds: . lidocaine      . LORazepam  0-4 mg Intravenous Q6H   Or  . LORazepam  0-4 mg Oral Q6H  . [START ON 03/16/2018] LORazepam  0-4 mg Intravenous Q12H   Or  . [START ON 03/16/2018] LORazepam  0-4 mg Oral Q12H  . thiamine  100 mg Oral Daily   Or  . thiamine  100 mg Intravenous Daily   Continuous Infusions: . cefTRIAXone (ROCEPHIN)  IV    . pantoprozole (PROTONIX) infusion 8 mg/hr (03/14/18 0554)   PRN Meds:.acetaminophen **OR** acetaminophen, ondansetron **OR** ondansetron (ZOFRAN) IV   PHYSICAL EXAM: Vital signs: Vitals:   03/13/18 2130 03/13/18 2251 03/14/18 0013 03/14/18 0608  BP: 126/70 129/81 111/68 115/77  Pulse: (!) 103 100 (!) 102 98  Resp: 18 18 18 19   Temp:  98.4 F (36.9 C) 99.4 F (37.4 C) 99.7 F (37.6 C)  TempSrc:  Oral Oral Oral  SpO2: 99% 99% 98% 98%  Weight:      Height:       Filed Weights   03/13/18 1810  Weight: 77.1 kg   Body mass index is 28.29 kg/m.   General appearance :Awake, alert, not in any distress.  HEENT: Atraumatic and Normocephalic Neck: supple, no JVD. No cervical lymphadenopathy. No thyromegaly Resp:Good air entry bilaterally, no added sounds  CVS: S1 S2 regular, no murmurs.  GI:  Bowel sounds present, Non tender and not distended with no gaurding, rigidity or rebound.No organomegaly Extremities: B/L Lower Ext shows no edema, both legs are warm to touch Neurology:  speech clear,Non focal, sensation is grossly intact. Psychiatric: Normal judgment and insight. Alert and oriented x 3. Normal mood. Musculoskeletal:No digital cyanosis Skin:No Rash, warm and dry Wounds:N/A  I have personally reviewed following labs and imaging studies  LABORATORY DATA: CBC: Recent Labs  Lab 03/13/18 1415 03/14/18 0358  WBC 3.5* 2.6*  HGB 6.3* 7.6*  HCT 22.9* 25.3*  MCV  75.1* 73.1*  PLT 70* 64*    Basic Metabolic Panel: Recent Labs  Lab 03/13/18 1415 03/14/18 0358  NA 137 136  K 3.4* 3.2*  CL 109 109  CO2 22 22  GLUCOSE 99 81  BUN <5* 5*  CREATININE 0.51* 0.57*  CALCIUM 7.5* 7.4*    GFR: Estimated Creatinine Clearance: 118.7 mL/min (A) (by C-G formula based on SCr of 0.57 mg/dL (L)).  Liver Function Tests: Recent Labs  Lab 03/13/18 1415 03/14/18 0358  AST 56* 47*  ALT 24 20  ALKPHOS 109 89  BILITOT 1.9* 4.6*  PROT 6.2* 5.5*  ALBUMIN 2.5* 2.3*   Recent Labs  Lab 03/13/18 1415  LIPASE 39   No results for input(s): AMMONIA in the last 168 hours.  Coagulation Profile: Recent Labs  Lab 03/13/18 1830  INR 1.9*    Cardiac Enzymes: No results for input(s): CKTOTAL, CKMB, CKMBINDEX, TROPONINI in the last 168 hours.  BNP (last 3 results) No results for input(s): PROBNP in the last 8760 hours.  HbA1C: No results for input(s): HGBA1C in the last 72 hours.  CBG: No results for input(s): GLUCAP in the last 168 hours.  Lipid Profile: No results for input(s): CHOL, HDL, LDLCALC, TRIG, CHOLHDL, LDLDIRECT in the last 72 hours.  Thyroid Function Tests: No results for input(s): TSH, T4TOTAL, FREET4, T3FREE, THYROIDAB in the last 72 hours.  Anemia Panel: No results for input(s): VITAMINB12, FOLATE, FERRITIN, TIBC, IRON, RETICCTPCT in the last 72 hours.  Urine analysis:    Component Value Date/Time   COLORURINE AMBER (A) 03/14/2018 0622   APPEARANCEUR CLEAR 03/14/2018 0622   LABSPEC >1.046 (H) 03/14/2018 0622   PHURINE 6.0 03/14/2018 0622   GLUCOSEU NEGATIVE 03/14/2018 0622   HGBUR NEGATIVE 03/14/2018 0622   BILIRUBINUR NEGATIVE 03/14/2018 0622   KETONESUR NEGATIVE 03/14/2018 0622   PROTEINUR NEGATIVE 03/14/2018 0622   UROBILINOGEN 0.2 08/02/2014 2045   NITRITE NEGATIVE 03/14/2018 0622   LEUKOCYTESUR NEGATIVE 03/14/2018 0622    Sepsis Labs: Lactic Acid, Venous No results found for: LATICACIDVEN  MICROBIOLOGY: No  results found for this or any previous visit (from the past 240 hour(s)).  RADIOLOGY STUDIES/RESULTS: Ct Abdomen Pelvis W Contrast  Result Date: 03/13/2018 CLINICAL DATA:  Abdominal pain and distention with leg swelling for 3 days, history of alcohol use disorder EXAM: CT ABDOMEN AND PELVIS WITH CONTRAST TECHNIQUE: Multidetector CT imaging of the abdomen and pelvis was performed using the standard protocol following bolus administration of intravenous contrast. Sagittal and coronal MPR images reconstructed from axial data set. CONTRAST:  100mL OMNIPAQUE IOHEXOL 300 MG/ML SOLN IV. No oral contrast. COMPARISON:  03/11/2016 FINDINGS: Lower chest: Tiny LEFT pleural effusion. Subsegmental atelectasis BILATERAL lower lobes Hepatobiliary: Thickened gallbladder wall, nonspecific in the setting of ascites. Fatty infiltration of liver with mild diffuse hepatic inhomogeneity. No discrete hepatic mass. Pancreas: Normal appearance Spleen: Normal appearance.  12.3 cm length. Adrenals/Urinary Tract: Adrenal glands, kidneys, ureters, and bladder normal appearance  Stomach/Bowel: Normal appendix. Stomach and bowel loops grossly unremarkable for technique Vascular/Lymphatic: Vascular structures grossly patent. No adenopathy. Reproductive: Unremarkable prostate gland and seminal vesicles Other: Large volume ascites.  Scattered soft tissue edema. Musculoskeletal: No acute osseous findings. IMPRESSION: Large volume ascites and scattered soft tissue edema. Fatty infiltration of liver with mild diffuse hepatic inhomogeneity; no definite hepatic nodularity is seen to suggest cirrhosis though this should be clinically considered. Electronically Signed   By: Ulyses Southward M.D.   On: 03/13/2018 18:04     LOS: 1 day   Jeoffrey Massed, MD  [facility]  If 7PM-7AM, please contact night-coverage  Please page via [email or web address]  Go to [email or web address] and use [facility] universal password to access. If you do not have the password,  please contact the hospital operator.  Locate the [facility] provider you are looking for under [facility] and page to a number that you can be directly reached. If you still have difficulty reaching the provider, please page the [facility] (Director on Call) for the Hospitalists listed on amion for assistance.  03/14/2018, 11:43 AM

## 2018-03-14 NOTE — Procedures (Signed)
PROCEDURE SUMMARY:  Successful US guided paracentesis from right lateral abdomen.  Yielded 3.0 liters of yellow fluid.  No immediate complications.  Pt tolerated well.   Specimen was sent for labs.  EBL < 52mL  Hoyt Koch PA-C 03/14/2018 12:18 PM

## 2018-03-14 NOTE — Consult Note (Signed)
[facility] Consultation Note  Referring Provider: [facility] Primary Care Physician:  Patient, No Pcp Per  Reason for Consultation:  Anemia, ascites  HPI (History via [employer] Spanish video interpreter): Alexavier Tsutsui is a 38 y.o. male with two-week history of weakness, lower extremity edema, abdominal distention.  Patient has history alcohol abuse, several bottles liquor per day.  Has no blood in stool.  Is anemic; no prior endoscopy or colonoscopy.  No fevers.   Past Medical History:  Diagnosis Date  . Alcohol withdrawal seizure (HCC)   . ETOH abuse     Past Surgical History:  Procedure Laterality Date  . IR PARACENTESIS  03/14/2018    Prior to Admission medications   Medication Sig Start Date End Date Taking? Authorizing Provider  CALCIUM PO Take 1 tablet by mouth daily.   Yes [provider]  LORazepam (ATIVAN) 2 MG tablet Take one tablet (2 mg) every hour so long as symptoms of delirium tremens are present. Do not exceed 10 mg per day. Patient not taking: Reported on 03/13/2018 11/11/16   Loletta Specter, PA-C  naltrexone (DEPADE) 50 MG tablet Take 1 tablet (50 mg total) by mouth daily. Patient not taking: Reported on 03/13/2018 11/11/16   Loletta Specter, PA-C  thiamine 100 MG tablet Take 1 tablet (100 mg total) by mouth 3 (three) times daily. Patient not taking: Reported on 03/13/2018 10/21/16   Loletta Specter, PA-C    Current Facility-Administered Medications  Medication Dose Route Frequency Provider Last Rate Last Dose  . acetaminophen (TYLENOL) tablet 650 mg  650 mg Oral Q6H PRN Hillary Bow, DO   650 mg at 03/14/18 0006   Or  . acetaminophen (TYLENOL) suppository 650 mg  650 mg Rectal Q6H PRN Hillary Bow, DO      . cefTRIAXone (ROCEPHIN) 1 g in sodium chloride 0.9 % 100 mL IVPB  1 g Intravenous Q24H Maretta Bees, MD 200 mL/hr at 03/14/18 1235 1 g at 03/14/18 1235  . [START ON 03/15/2018] folic acid (FOLVITE) tablet 1 mg  1 mg Oral  Daily Ghimire, Shanker M, MD      . lidocaine (XYLOCAINE) 1 % (with pres) injection           . LORazepam (ATIVAN) injection 0-4 mg  0-4 mg Intravenous Q6H Layden, Lindsey A, PA-C   2 mg at 03/13/18 1825   Or  . LORazepam (ATIVAN) tablet 0-4 mg  0-4 mg Oral Q6H Layden, Lindsey A, PA-C   1 mg at 03/14/18 0602  . [START ON 03/16/2018] LORazepam (ATIVAN) injection 0-4 mg  0-4 mg Intravenous Q12H Graciella Freer A, PA-C       Or  . Melene Muller ON 03/16/2018] LORazepam (ATIVAN) tablet 0-4 mg  0-4 mg Oral Q12H Layden, Lindsey A, PA-C      . ondansetron (ZOFRAN) tablet 4 mg  4 mg Oral Q6H PRN Hillary Bow, DO       Or  . ondansetron [facility]) injection 4 mg  4 mg Intravenous Q6H PRN Hillary Bow, DO      . pantoprazole (PROTONIX) 80 mg in sodium chloride 0.9 % 250 mL (0.32 mg/mL) infusion  8 mg/hr Intravenous Continuous Graciella Freer A, PA-C 25 mL/hr at 03/14/18 0554 8 mg/hr at 03/14/18 0554  . potassium chloride 10 mEq in 100 mL IVPB  10 mEq Intravenous Q1 Hr x 2 Ghimire, Shanker M, MD 100 mL/hr at 03/14/18 1238 10 mEq at 03/14/18 1238  . potassium chloride  10 MEQ/100ML IVPB           . thiamine (VITAMIN B-1) tablet 100 mg  100 mg Oral Daily Layden, Mardella Layman A, PA-C        Allergies as of 03/13/2018  . (No Known Allergies)    History reviewed. No pertinent family history.  Social History   Socioeconomic History  . Marital status: Significant Other    Spouse name: Not on file  . Number of children: Not on file  . Years of education: Not on file  . Highest education level: Not on file  Occupational History  . Not on file  Social Needs  . Financial resource strain: Not on file  . Food insecurity:    Worry: Not on file    Inability: Not on file  . Transportation needs:    Medical: Not on file    Non-medical: Not on file  Tobacco Use  . Smoking status: Never Smoker  . Smokeless tobacco: Never Used  Substance and Sexual Activity  . Alcohol use: Yes    Comment: daily   . Drug use: No   . Sexual activity: Not on file  Lifestyle  . Physical activity:    Days per week: Not on file    Minutes per session: Not on file  . Stress: Not on file  Relationships  . Social connections:    Talks on phone: Not on file    Gets together: Not on file    Attends religious service: Not on file    Active member of club or organization: Not on file    Attends meetings of clubs or organizations: Not on file    Relationship status: Not on file  . Intimate partner violence:    Fear of current or ex partner: Not on file    Emotionally abused: Not on file    Physically abused: Not on file    Forced sexual activity: Not on file  Other Topics Concern  . Not on file  Social History Narrative  . Not on file    Review of Systems: As per HPI, all others negative  Physical Exam: Vital signs in last 24 hours: Temp:  [98.3 F (36.8 C)-99.7 F (37.6 C)] 99.7 F (37.6 C) (03/04 0608) Pulse Rate:  [85-107] 98 (03/04 0608) Resp:  [15-24] 19 (03/04 0608) BP: (108-155)/(64-91) 115/77 (03/04 0608) SpO2:  [97 %-100 %] 98 % (03/04 0608) Weight:  [77.1 kg] 77.1 kg (03/03 1810) Last BM Date: (PTA) General:   Alert,  Flat affect, older-appearing than stated age, in NAD Head:  Normocephalic and atraumatic. Eyes:  Sclera icteric.   Conjunctiva pink. Ears:  Normal auditory acuity. Nose:  No deformity, discharge,  or lesions. Mouth:  No deformity or lesions.  Oropharynx pink & moist. Neck:  Supple; no masses or thyromegaly. Abdomen:  Soft, caput medusae, moderate ascites, mild generalized tenderness without peritonitis, No masses, hepatosplenomegaly or hernias noted. Normal bowel sounds, without guarding, and without rebound.     Msk:  Symmetrical without gross deformities. Normal posture. Pulses:  Normal pulses noted. Extremities:  2+ pitting edema ankles Neurologic: alert, tremulous, weak but grossly normal neurologically. Skin:  Scattered ecchymoses and telangiectasias Intact without  significant lesions or rashes. Cervical Nodes:  No significant cervical adenopathy. Psych:  Alert and cooperative. Normal mood and affect.   Lab Results: Recent Labs    03/13/18 1415 03/14/18 0358  WBC 3.5* 2.6*  HGB 6.3* 7.6*  HCT 22.9* 25.3*  PLT 70* 64*  BMET Recent Labs    03/13/18 1415 03/14/18 0358  NA 137 136  K 3.4* 3.2*  CL 109 109  CO2 22 22  GLUCOSE 99 81  BUN <5* 5*  CREATININE 0.51* 0.57*  CALCIUM 7.5* 7.4*   LFT Recent Labs    03/14/18 0358  PROT 5.5*  ALBUMIN 2.3*  AST 47*  ALT 20  ALKPHOS 89  BILITOT 4.6*   PT/INR Recent Labs    03/13/18 1830  LABPROT 21.3*  INR 1.9*    Studies/Results: Ct Abdomen Pelvis W Contrast  Result Date: 03/13/2018 CLINICAL DATA:  Abdominal pain and distention with leg swelling for 3 days, history of alcohol use disorder EXAM: CT ABDOMEN AND PELVIS WITH CONTRAST TECHNIQUE: Multidetector CT imaging of the abdomen and pelvis was performed using the standard protocol following bolus administration of intravenous contrast. Sagittal and coronal MPR images reconstructed from axial data set. CONTRAST:  OMNIPAQUE IOHEXOL 300 MG/ML SOLN IV. No oral contrast. COMPARISON:  03/11/2016 FINDINGS: Lower chest: Tiny LEFT pleural effusion. Subsegmental atelectasis BILATERAL lower lobes Hepatobiliary: Thickened gallbladder wall, nonspecific in the setting of ascites. Fatty infiltration of liver with mild diffuse hepatic inhomogeneity. No discrete hepatic mass. Pancreas: Normal appearance Spleen: Normal appearance.  12.3 cm length. Adrenals/Urinary Tract: Adrenal glands, kidneys, ureters, and bladder normal appearance Stomach/Bowel: Normal appendix. Stomach and bowel loops grossly unremarkable for technique Vascular/Lymphatic: Vascular structures grossly patent. No adenopathy. Reproductive: Unremarkable prostate gland and seminal vesicles Other: Large volume ascites.  Scattered soft tissue edema. Musculoskeletal: No acute osseous  findings. IMPRESSION: Large volume ascites and scattered soft tissue edema. Fatty infiltration of liver with mild diffuse hepatic inhomogeneity; no definite hepatic nodularity is seen to suggest cirrhosis though this should be clinically considered. Electronically Signed   By: Ulyses Southward M.D.   On: 03/13/2018 18:04   Ir Paracentesis  Result Date: 03/14/2018 INDICATION: Patient with history of alcohol use, now with abdominal distention due to ascites. Request is made for diagnostic and therapeutic paracentesis. EXAM: ULTRASOUND GUIDED DIAGNOSTIC AND THERAPEUTIC PARACENTESIS MEDICATIONS: 10 mL 1% lidocaine COMPLICATIONS: None immediate. PROCEDURE: Informed written consent was obtained from the patient after a discussion of the risks, benefits and alternatives to treatment. A timeout was performed prior to the initiation of the procedure. Initial ultrasound scanning demonstrates a moderate amount of ascites within the right lower abdominal quadrant. The right lower abdomen was prepped and draped in the usual sterile fashion. 1% lidocaine was used for local anesthesia. Following this, a 19 gauge, 7-cm, Yueh catheter was introduced. An ultrasound image was saved for documentation purposes. The paracentesis was performed. The catheter was removed and a dressing was applied. The patient tolerated the procedure well without immediate post procedural complication. FINDINGS: A total of approximately 3.0 liters of yellow fluid was removed. Samples were sent to the laboratory as requested by the clinical team. IMPRESSION: Successful ultrasound-guided diagnostic and therapeutic paracentesis yielding 3.0 liters of peritoneal fluid. Read by: Loyce Dys PA-C Electronically Signed   By: Richarda Overlie M.D.   On: 03/14/2018 12:19    Impression:  1.  Hemoccult negative, microcytic, anemia. 2.  New onset ascites. 3.  Elevated liver enzymes. 4.  Ascites. 5.  Alcohol abuse, appears to be undergoing mild withdrawal now. 6.   Suspect acute alcoholic hepatitis on top of chronic alcohol-related liver disease.  Plan:  1.  Awaiting paracentesis results. 2.  Watch for alcohol withdrawal. 3.  Discriminant function < 32 (31); pending paracentesis results to assess for SBP and pending  evolution of liver enzymes, would hold off on steroid therapy at this time. 4.  Alcohol abstinence key; patient will need inpatient versus intensive outpatient alcohol rehabilitation therapy. 5.  I discussed with patient that he will likely die with ongoing alcohol use. 6.  Check acute hepatitis panel. 7.  Pending alcohol withdrawal symptoms, may consider endoscopy and colonoscopy this admission. 8.  [facility] will follow.   LOS: 1 day   Vrinda Heckstall M  03/14/2018, 1:36 PM  Cell [phone] If no answer or after 5 PM call [phone]

## 2018-03-15 LAB — BPAM RBC
BLOOD PRODUCT EXPIRATION DATE: 202003102359
Blood Product Expiration Date: 202003102359
Blood Product Expiration Date: 202004022359
ISSUE DATE / TIME: 202003032102
ISSUE DATE / TIME: 202003041750
ISSUE DATE / TIME: 202003031759
UNIT TYPE AND RH: 5100
Unit Type and Rh: 5100
Unit Type and Rh: 5100

## 2018-03-15 LAB — COMPREHENSIVE METABOLIC PANEL
ALT: 19 U/L (ref 0–44)
AST: 43 U/L — ABNORMAL HIGH (ref 15–41)
Albumin: 2.1 g/dL — ABNORMAL LOW (ref 3.5–5.0)
Alkaline Phosphatase: 83 U/L (ref 38–126)
Anion gap: 7 (ref 5–15)
BILIRUBIN TOTAL: 3.5 mg/dL — AB (ref 0.3–1.2)
BUN: <5 mg/dL — ABNORMAL LOW (ref 6–20)
CO2: 23 mmol/L (ref 22–32)
CREATININE: 0.71 mg/dL (ref 0.61–1.24)
Calcium: 7.4 mg/dL — ABNORMAL LOW (ref 8.9–10.3)
Chloride: 105 mmol/L (ref 98–111)
GFR calc Af Amer: >60 mL/min (ref >60)
GFR calc non Af Amer: >60 mL/min (ref >60)
Glucose, Bld: 81 mg/dL (ref 70–99)
Potassium: 3.3 mmol/L — ABNORMAL LOW (ref 3.5–5.1)
Sodium: 135 mmol/L (ref 135–145)
Total Protein: 5.2 g/dL — ABNORMAL LOW (ref 6.5–8.1)

## 2018-03-15 LAB — TYPE AND SCREEN
ABO/RH(D): O POS
ANTIBODY SCREEN: NEGATIVE
UNIT DIVISION: 0
Unit division: 0
Unit division: 0

## 2018-03-15 LAB — CBC
HCT: 29 % — ABNORMAL LOW (ref 39.0–52.0)
Hemoglobin: 8.6 g/dL — ABNORMAL LOW (ref 13.0–17.0)
MCH: 22.1 pg — ABNORMAL LOW (ref 26.0–34.0)
MCHC: 29.7 g/dL — ABNORMAL LOW (ref 30.0–36.0)
MCV: 74.6 fL — ABNORMAL LOW (ref 80.0–100.0)
Platelets: 60 10*3/uL — ABNORMAL LOW (ref 150–400)
RBC: 3.89 MIL/uL — AB (ref 4.22–5.81)
RDW: 19.5 % — ABNORMAL HIGH (ref 11.5–15.5)
WBC: 2.8 10*3/uL — ABNORMAL LOW (ref 4.0–10.5)
nRBC: 0.7 % — ABNORMAL HIGH (ref 0.0–0.2)

## 2018-03-15 LAB — PH, BODY FLUID: pH, Body Fluid: 7.8

## 2018-03-15 LAB — MAGNESIUM: Magnesium: 1.7 mg/dL (ref 1.7–2.4)

## 2018-03-15 MED ORDER — LORAZEPAM 1 MG PO TABS: 1.0000 mg | ORAL_TABLET | Freq: Four times a day (QID) | ORAL | Status: DC | PRN

## 2018-03-15 MED ORDER — LORAZEPAM 2 MG/ML IJ SOLN: 1.0000 mg | Freq: Four times a day (QID) | INTRAMUSCULAR | Status: DC | PRN

## 2018-03-15 MED ORDER — MAGNESIUM SULFATE 2 GM/50ML IV SOLN
2.0000 g | Freq: Once | INTRAVENOUS | Status: AC
  Administered 2018-03-15: 2 g via INTRAVENOUS
  Filled 2018-03-15: qty 50

## 2018-03-15 MED ORDER — POTASSIUM CHLORIDE CRYS ER 20 MEQ PO TBCR
40.0000 meq | EXTENDED_RELEASE_TABLET | Freq: Once | ORAL | Status: AC
  Administered 2018-03-15: 40 meq via ORAL
  Filled 2018-03-15: qty 2

## 2018-03-15 NOTE — Progress Notes (Signed)
High risk for progressive alcohol withdrawal (was already tremulous yesterday), thus want to wait another day or two before doing bowel preparation for endoscopy/colonoscopy (otherwise, would be at increased risk of either not being able to consume the prep or, more of concern, of aspirating the prep).  Re: ascites, albumin level in fluid was not ordered (or hasn't been reported yet), thus SAAG can't be calculated, but certainly suspicion is strong for liver-related etiology.  In either event, total WBC is 89, thus no SBP.  We will revisit tomorrow, and see if patient would be ready at that time to undergo bowel preparation.

## 2018-03-15 NOTE — Progress Notes (Signed)
PROGRESS NOTE        PATIENT DETAILS Name: Mathew [name] Age: 38 y.o. Sex: male Date of Birth: 09-17-1980 Admit Date: 03/13/2018 Admitting Physician Hillary Bow, DO IWO:[id number], No Pcp Per  Brief Narrative: Patient is a 38 y.o. male with history of alcohol use presenting with abdominal pain and hematemesis, found to have acute blood loss anemia.  See below for further details  Subjective: Some abdominal discomfort today but is awake and alert.  Denies any hematemesis or melena.  Assessment/Plan: Upper GI bleeding with acute blood loss anemia: No further melena or hematemesis since admission-continue PPI-not on octreotide as GI bleeding has completely resolved-GI following-defer timing of EGD to gastroenterology.  If bleeding reoccurs-we will start octreotide infusion.  Hemoglobin stable at 8.6, following 3 units of PRBC transfusion.  Continue to follow CBC and will await further recommendations from GI.  EtOH abuse: Last drink on 3/3-remains very minimally tremulous-continue Ativan per protocol.    Encouraged/counseled EtOH abstinence.  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  Follow.  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  Counseled extensively regarding importance of abstaining from alcohol use.    DVT Prophylaxis: SCD's  Code Status: Full code   Family Communication: None at bedside  Disposition Plan: Remain inpatient  Antimicrobial agents: Anti-infectives (From admission, onward)   Start     Dose/Rate Route Frequency Ordered Stop   03/14/18 1230  cefTRIAXone (ROCEPHIN) 1 g in sodium chloride 0.9 % 100 mL IVPB     1 g 200 mL/hr over 30 Minutes Intravenous Every 24 hours 03/14/18 1123        Procedures: None  CONSULTS:  GI  Time spent: 25- minutes-Greater than 50% of this time was spent in counseling, explanation of  diagnosis, planning of further management, and coordination of care.  MEDICATIONS: Scheduled Meds: . folic acid  1 mg Oral Daily  . LORazepam  0-4 mg Intravenous Q6H   Or  . LORazepam  0-4 mg Oral Q6H  . [START ON 03/16/2018] LORazepam  0-4 mg Intravenous Q12H   Or  . [START ON 03/16/2018] LORazepam  0-4 mg Oral Q12H  . thiamine  100 mg Oral Daily   Continuous Infusions: . cefTRIAXone (ROCEPHIN)  IV 1 g (03/14/18 1235)  . pantoprozole (PROTONIX) infusion 8 mg/hr (03/15/18 0556)   PRN Meds:.acetaminophen **OR** acetaminophen, ondansetron **OR** ondansetron (ZOFRAN) IV   PHYSICAL EXAM: Vital signs: Vitals:   03/14/18 1800 03/14/18 1810 03/14/18 2036 03/15/18 0519  BP:  118/72 116/75 123/80  Pulse: 88 80 83 80  Resp:  18 18   Temp:  98.5 F (36.9 C) 98.2 F (36.8 C) 97.6 F (36.4 C)  TempSrc:  Oral Oral Oral  SpO2:  97% 98% 99%  Weight:      Height:       Filed Weights   03/13/18 1810  Weight: 77.1 kg   Body mass index is 28.29 kg/m.   General appearance:Awake, alert, not in any distress.  Eyes:no scleral icterus. HEENT: Atraumatic and Normocephalic Neck: supple, no JVD. Resp:Good air entry bilaterally,no rales or rhonchi CVS: S1 S2 regular, no murmurs.  GI: Bowel sounds present, Non tender and not distended with no gaurding, rigidity or rebound. Extremities: B/L Lower Ext shows no edema, both legs are warm to touch Neurology:  Non focal Psychiatric: Normal judgment and insight. Normal mood. Musculoskeletal:No digital cyanosis Skin:No Rash, warm and dry Wounds:N/A  I have personally reviewed following labs and imaging studies  LABORATORY DATA: CBC: Recent Labs  Lab 03/13/18 1415 03/14/18 0358 03/14/18 1236 03/15/18 0355  WBC 3.5* 2.6* 2.4* 2.8*  HGB 6.3* 7.6* 7.4* 8.6*  HCT 22.9* 25.3* 25.3* 29.0*  MCV 75.1* 73.1* 73.3* 74.6*  PLT 70* 64* 62* 60*    Basic Metabolic Panel: Recent Labs  Lab 03/13/18 1415 03/14/18 0358 03/15/18 0355  NA 137 136  135  K 3.4* 3.2* 3.3*  CL 109 109 105  CO2 22 22 23   GLUCOSE 99 81 81  BUN <5* 5* <5*  CREATININE 0.51* 0.57* 0.71  CALCIUM 7.5* 7.4* 7.4*  MG  --   --  1.7    GFR: Estimated Creatinine Clearance: 118.7 mL/min (by C-G formula based on SCr of 0.71 mg/dL).  Liver Function Tests: Recent Labs  Lab 03/13/18 1415 03/14/18 0358 03/15/18 0355  AST 56* 47* 43*  ALT 24 20 19   ALKPHOS 109 89 83  BILITOT 1.9* 4.6* 3.5*  PROT 6.2* 5.5* 5.2*  ALBUMIN 2.5* 2.3* 2.1*   Recent Labs  Lab 03/13/18 1415  LIPASE 39   No results for input(s): AMMONIA in the last 168 hours.  Coagulation Profile: Recent Labs  Lab 03/13/18 1830  INR 1.9*    Cardiac Enzymes: No results for input(s): CKTOTAL, CKMB, CKMBINDEX, TROPONINI in the last 168 hours.  BNP (last 3 results) No results for input(s): PROBNP in the last 8760 hours.  HbA1C: No results for input(s): HGBA1C in the last 72 hours.  CBG: No results for input(s): GLUCAP in the last 168 hours.  Lipid Profile: No results for input(s): CHOL, HDL, LDLCALC, TRIG, CHOLHDL, LDLDIRECT in the last 72 hours.  Thyroid Function Tests: No results for input(s): TSH, T4TOTAL, FREET4, T3FREE, THYROIDAB in the last 72 hours.  Anemia Panel: No results for input(s): VITAMINB12, FOLATE, FERRITIN, TIBC, IRON, RETICCTPCT in the last 72 hours.  Urine analysis:    Component Value Date/Time   COLORURINE AMBER (A) 03/14/2018 0622   APPEARANCEUR CLEAR 03/14/2018 0622   LABSPEC >1.046 (H) 03/14/2018 0622   PHURINE 6.0 03/14/2018 0622   GLUCOSEU NEGATIVE 03/14/2018 0622   HGBUR NEGATIVE 03/14/2018 0622   BILIRUBINUR NEGATIVE 03/14/2018 0622   KETONESUR NEGATIVE 03/14/2018 0622   PROTEINUR NEGATIVE 03/14/2018 0622   UROBILINOGEN 0.2 08/02/2014 2045   NITRITE NEGATIVE 03/14/2018 0622   LEUKOCYTESUR NEGATIVE 03/14/2018 0622    Sepsis Labs: Lactic Acid, Venous No results found for: LATICACIDVEN  MICROBIOLOGY: Recent Results (from the past 240  hour(s))  Gram stain     Status: None   Collection Time: 03/14/18 12:15 PM  Result Value Ref Range Status   Specimen Description PERITONEAL  Final   Special Requests NONE  Final   Gram Stain   Final    RARE WBC PRESENT,BOTH PMN AND MONONUCLEAR NO ORGANISMS SEEN Performed at [facility], [address] [place], [place] [address]    Report Status 03/14/2018 FINAL  Final    RADIOLOGY STUDIES/RESULTS: Ct Abdomen Pelvis W Contrast  Result Date: 03/13/2018 CLINICAL DATA:  Abdominal pain and distention with leg swelling for 3 days, history of alcohol use disorder EXAM: CT ABDOMEN AND PELVIS WITH CONTRAST TECHNIQUE: Multidetector CT imaging of the abdomen and pelvis was performed using the standard protocol following bolus administration of intravenous contrast. Sagittal and coronal MPR images reconstructed from axial data  set. CONTRAST:  OMNIPAQUE IOHEXOL 300 MG/ML SOLN IV. No oral contrast. COMPARISON:  03/11/2016 FINDINGS: Lower chest: Tiny LEFT pleural effusion. Subsegmental atelectasis BILATERAL lower lobes Hepatobiliary: Thickened gallbladder wall, nonspecific in the setting of ascites. Fatty infiltration of liver with mild diffuse hepatic inhomogeneity. No discrete hepatic mass. Pancreas: Normal appearance Spleen: Normal appearance.  12.3 cm length. Adrenals/Urinary Tract: Adrenal glands, kidneys, ureters, and bladder normal appearance Stomach/Bowel: Normal appendix. Stomach and bowel loops grossly unremarkable for technique Vascular/Lymphatic: Vascular structures grossly patent. No adenopathy. Reproductive: Unremarkable prostate gland and seminal vesicles Other: Large volume ascites.  Scattered soft tissue edema. Musculoskeletal: No acute osseous findings. IMPRESSION: Large volume ascites and scattered soft tissue edema. Fatty infiltration of liver with mild diffuse hepatic inhomogeneity; no definite hepatic nodularity is seen to suggest cirrhosis though this should be clinically  considered. Electronically Signed   By: Ulyses Southward M.D.   On: 03/13/2018 18:04   US Abdomen Limited Ruq  Result Date: 03/14/2018 CLINICAL DATA:  Cirrhosis, abnormal gallbladder on CT EXAM: ULTRASOUND ABDOMEN LIMITED RIGHT UPPER QUADRANT COMPARISON:  CT 03/13/2018 FINDINGS: Gallbladder: No shadowing stones. Increased wall thickness at 6.7 mm. Negative sonographic Murphy. Common bile duct: Diameter: 4.8 mm Liver: Coarse echogenic and nodular consistent with cirrhosis. Portal vein is patent on color Doppler imaging with normal direction of blood flow towards the liver. Small to moderate ascites in the abdomen. IMPRESSION: 1. Thickened gallbladder wall without shadowing stone. Findings are nonspecific and could be secondary to acute or chronic cholecystitis, liver disease, or edema forming states. 2. Cirrhotic morphology of the liver with small moderate ascites Electronically Signed   By: Jasmine Pang M.D.   On: 03/14/2018 22:34   Ir Paracentesis  Result Date: 03/14/2018 INDICATION: Patient with history of alcohol use, now with abdominal distention due to ascites. Request is made for diagnostic and therapeutic paracentesis. EXAM: ULTRASOUND GUIDED DIAGNOSTIC AND THERAPEUTIC PARACENTESIS MEDICATIONS: 10 mL 1% lidocaine COMPLICATIONS: None immediate. PROCEDURE: Informed written consent was obtained from the patient after a discussion of the risks, benefits and alternatives to treatment. A timeout was performed prior to the initiation of the procedure. Initial ultrasound scanning demonstrates a moderate amount of ascites within the right lower abdominal quadrant. The right lower abdomen was prepped and draped in the usual sterile fashion. 1% lidocaine was used for local anesthesia. Following this, a 19 gauge, 7-cm, Yueh catheter was introduced. An ultrasound image was saved for documentation purposes. The paracentesis was performed. The catheter was removed and a dressing was applied. The patient tolerated the  procedure well without immediate post procedural complication. FINDINGS: A total of approximately 3.0 liters of yellow fluid was removed. Samples were sent to the laboratory as requested by the clinical team. IMPRESSION: Successful ultrasound-guided diagnostic and therapeutic paracentesis yielding 3.0 liters of peritoneal fluid. Read by: Loyce Dys PA-C Electronically Signed   By: Richarda Overlie M.D.   On: 03/14/2018 12:19     LOS: 2 days   Jeoffrey Massed, MD  [facility]  If 7PM-7AM, please contact night-coverage  Please page via [email or web address]  Go to [email or web address] and use [facility]'s universal password to access. If you do not have the password, please contact the hospital operator.  Locate the [facility] provider you are looking for under [facility] and page to a number that you can be directly reached. If you still have difficulty reaching the provider, please page the [facility] (Director on Call) for the Hospitalists listed on amion for assistance.  03/15/2018, 11:47 AM

## 2018-03-16 LAB — CBC
HCT: 30.4 % — ABNORMAL LOW (ref 39.0–52.0)
Hemoglobin: 9.1 g/dL — ABNORMAL LOW (ref 13.0–17.0)
MCH: 22.9 pg — ABNORMAL LOW (ref 26.0–34.0)
MCHC: 29.9 g/dL — ABNORMAL LOW (ref 30.0–36.0)
MCV: 76.4 fL — ABNORMAL LOW (ref 80.0–100.0)
NRBC: 0 % (ref 0.0–0.2)
PLATELETS: 57 10*3/uL — AB (ref 150–400)
RBC: 3.98 MIL/uL — ABNORMAL LOW (ref 4.22–5.81)
RDW: 20.8 % — ABNORMAL HIGH (ref 11.5–15.5)
WBC: 2.9 10*3/uL — ABNORMAL LOW (ref 4.0–10.5)

## 2018-03-16 LAB — COMPREHENSIVE METABOLIC PANEL
ALT: 19 U/L (ref 0–44)
AST: 38 U/L (ref 15–41)
Albumin: 2.2 g/dL — ABNORMAL LOW (ref 3.5–5.0)
Alkaline Phosphatase: 84 U/L (ref 38–126)
Anion gap: 6 (ref 5–15)
BUN: <5 mg/dL — ABNORMAL LOW (ref 6–20)
CO2: 20 mmol/L — ABNORMAL LOW (ref 22–32)
Calcium: 7.8 mg/dL — ABNORMAL LOW (ref 8.9–10.3)
Chloride: 108 mmol/L (ref 98–111)
Creatinine, Ser: 0.7 mg/dL (ref 0.61–1.24)
GFR calc Af Amer: >60 mL/min (ref >60)
Glucose, Bld: 79 mg/dL (ref 70–99)
Potassium: 3.9 mmol/L (ref 3.5–5.1)
Sodium: 134 mmol/L — ABNORMAL LOW (ref 135–145)
TOTAL PROTEIN: 5.3 g/dL — AB (ref 6.5–8.1)
Total Bilirubin: 2.9 mg/dL — ABNORMAL HIGH (ref 0.3–1.2)

## 2018-03-16 LAB — AMMONIA: Ammonia: 40 umol/L — ABNORMAL HIGH (ref 9–35)

## 2018-03-16 LAB — MAGNESIUM: Magnesium: 2 mg/dL (ref 1.7–2.4)

## 2018-03-16 MED ORDER — PEG 3350-KCL-NA BICARB-NACL 420 G PO SOLR
4000.0000 mL | Freq: Once | ORAL | Status: AC
  Administered 2018-03-16: 4000 mL via ORAL
  Filled 2018-03-16: qty 4000

## 2018-03-16 MED ORDER — PANTOPRAZOLE SODIUM 40 MG IV SOLR
40.0000 mg | Freq: Two times a day (BID) | INTRAVENOUS | Status: DC
  Administered 2018-03-16 – 2018-03-17 (×2): 40 mg via INTRAVENOUS
  Filled 2018-03-16 (×2): qty 40

## 2018-03-16 NOTE — Progress Notes (Signed)
Hemoglobin improving.  Patient grossly stable and without overt evidence of active bleeding.  Remains thrombocytopenic, raising concern of the presence of varices or at least portal gastropathy to account for his significant anemia.  Have discussed the nature, purpose, and risks of endoscopy and colonoscopy with the patient, using the video interpreting services.  Patient is agreeable.  Will initiate prep this afternoon.  Procedure is scheduled for 7:30 AM tomorrow.  Florencia Reasons, M.D. Pager [phone] If no answer or after 5 PM call [phone]

## 2018-03-16 NOTE — Progress Notes (Signed)
PROGRESS NOTE        PATIENT DETAILS Name: Mathew [name] Age: 38 y.o. Sex: male Date of Birth: 31-Jul-1980 Admit Date: 03/13/2018 Admitting Physician Hillary Bow, DO ZGY:[id number], No Pcp Per  Brief Narrative: Patient is a 38 y.o. male with history of alcohol use presenting with abdominal pain and hematemesis, found to have acute blood loss anemia.  See below for further details  Subjective: Comfortably in bed-denies any chest pain, shortness of breath, abdominal pain.  No melena, hematemesis or hematochezia since admission.  Assessment/Plan: Upper GI bleeding with acute blood loss anemia: Upper GI bleeding has resolved-hemoglobin stable after 3 units of PRBC transfusion.  Continue PPI-awaiting GI follow-up today to determine timing of EGD.  Since bleeding has resolved-we are holding off on starting octreotide.  Continue to follow CBC  EtOH abuse: Last drink on 3/3-hardly any tremors today-completely awake and alert.  Have continue to encourage alcohol abstinence.    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  Follow.  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  Counseled extensively regarding importance of abstaining from alcohol use.    DVT Prophylaxis: SCD's  Code Status: Full code   Family Communication: None at bedside  Disposition Plan: Remain inpatient-Home probably over the weekend once endoscopic evaluation has been completed.  Antimicrobial agents: Anti-infectives (From admission, onward)   Start     Dose/Rate Route Frequency Ordered Stop   03/14/18 1230  cefTRIAXone (ROCEPHIN) 1 g in sodium chloride 0.9 % 100 mL IVPB     1 g 200 mL/hr over 30 Minutes Intravenous Every 24 hours 03/14/18 1123        Procedures: None  CONSULTS:  GI  Time spent: 25- minutes-Greater than 50% of this time was spent in counseling,  explanation of diagnosis, planning of further management, and coordination of care.  MEDICATIONS: Scheduled Meds: . folic acid  1 mg Oral Daily  . thiamine  100 mg Oral Daily   Continuous Infusions: . cefTRIAXone (ROCEPHIN)  IV 1 g (03/15/18 1224)  . pantoprozole (PROTONIX) infusion 8 mg/hr (03/16/18 0002)   PRN Meds:.acetaminophen **OR** acetaminophen, LORazepam **OR** LORazepam, ondansetron **OR** ondansetron (ZOFRAN) IV   PHYSICAL EXAM: Vital signs: Vitals:   03/15/18 1330 03/15/18 2211 03/16/18 0000 03/16/18 0603  BP: 117/67 112/67 111/62 119/73  Pulse: 88 78  69  Resp: 18 18 20 18   Temp: 98.2 F (36.8 C) 98.2 F (36.8 C) 99.1 F (37.3 C) 98.3 F (36.8 C)  TempSrc: Oral Oral Oral Oral  SpO2: 99% 99% 100% 100%  Weight:      Height:       Filed Weights   03/13/18 1810  Weight: 77.1 kg   Body mass index is 28.29 kg/m.   General appearance:Awake, alert, not in any distress.  Eyes:no scleral icterus. HEENT: Atraumatic and Normocephalic Neck: supple, no JVD. Resp:Good air entry bilaterally,no rales or rhonchi CVS: S1 S2 regular, no murmurs.  GI: Bowel sounds present, Non tender and not distended with no gaurding, rigidity or rebound. Extremities: B/L Lower Ext shows no edema, both legs are warm to touch Neurology:  Non focal Psychiatric: Normal judgment and insight. Normal mood. Musculoskeletal:No digital cyanosis Skin:No Rash, warm and dry Wounds:N/A  I have personally reviewed following labs and imaging studies  LABORATORY DATA: CBC: Recent Labs  Lab 03/13/18 1415 03/14/18 0358 03/14/18 1236 03/15/18 0355 03/16/18 0512  WBC 3.5* 2.6* 2.4* 2.8* 2.9*  HGB 6.3* 7.6* 7.4* 8.6* 9.1*  HCT 22.9* 25.3* 25.3* 29.0* 30.4*  MCV 75.1* 73.1* 73.3* 74.6* 76.4*  PLT 70* 64* 62* 60* 57*    Basic Metabolic Panel: Recent Labs  Lab 03/13/18 1415 03/14/18 0358 03/15/18 0355 03/16/18 0512  NA 137 136 135 134*  K 3.4* 3.2* 3.3* 3.9  CL 109 109 105 108  CO2 20*  GLUCOSE 99 81 81 79  BUN <5* 5* <5* <5*  CREATININE 0.51* 0.57* 0.71 0.70  CALCIUM 7.5* 7.4* 7.4* 7.8*  MG  --   --  1.7 2.0    GFR: Estimated Creatinine Clearance: 118.7 mL/min (by C-G formula based on SCr of 0.7 mg/dL).  Liver Function Tests: Recent Labs  Lab 03/13/18 1415 03/14/18 0358 03/15/18 0355 03/16/18 0512  AST 56* 47* 43* 38  ALT ALKPHOS 109 89 83 84  BILITOT 1.9* 4.6* 3.5* 2.9*  PROT 6.2* 5.5* 5.2* 5.3*  ALBUMIN 2.5* 2.3* 2.1* 2.2*   Recent Labs  Lab 03/13/18 1415  LIPASE 39   Recent Labs  Lab 03/16/18 0512  AMMONIA 40*    Coagulation Profile: Recent Labs  Lab 03/13/18 1830  INR 1.9*    Cardiac Enzymes: No results for input(s): CKTOTAL, CKMB, CKMBINDEX, TROPONINI in the last 168 hours.  BNP (last 3 results) No results for input(s): PROBNP in the last 8760 hours.  HbA1C: No results for input(s): HGBA1C in the last 72 hours.  CBG: No results for input(s): GLUCAP in the last 168 hours.  Lipid Profile: No results for input(s): CHOL, HDL, LDLCALC, TRIG, CHOLHDL, LDLDIRECT in the last 72 hours.  Thyroid Function Tests: No results for input(s): TSH, T4TOTAL, FREET4, T3FREE, THYROIDAB in the last 72 hours.  Anemia Panel: No results for input(s): VITAMINB12, FOLATE, FERRITIN, TIBC, IRON, RETICCTPCT in the last 72 hours.  Urine analysis:    Component Value Date/Time   COLORURINE AMBER (A) 03/14/2018 0622   APPEARANCEUR CLEAR 03/14/2018 0622   LABSPEC >1.046 (H) 03/14/2018 0622   PHURINE 6.0 03/14/2018 0622   GLUCOSEU NEGATIVE 03/14/2018 0622   HGBUR NEGATIVE 03/14/2018 0622   BILIRUBINUR NEGATIVE 03/14/2018 0622   KETONESUR NEGATIVE 03/14/2018 0622   PROTEINUR NEGATIVE 03/14/2018 0622   UROBILINOGEN 0.2 08/02/2014 2045   NITRITE NEGATIVE 03/14/2018 0622   LEUKOCYTESUR NEGATIVE 03/14/2018 0622    Sepsis Labs: Lactic Acid, Venous No results found for: LATICACIDVEN  MICROBIOLOGY: Recent Results (from the past  240 hour(s))  Gram stain     Status: None   Collection Time: 03/14/18 12:15 PM  Result Value Ref Range Status   Specimen Description PERITONEAL  Final   Special Requests NONE  Final   Gram Stain   Final    RARE WBC PRESENT,BOTH PMN AND MONONUCLEAR NO ORGANISMS SEEN Performed at [facility], [address] [place], [place] [address]    Report Status 03/14/2018 FINAL  Final  Culture, body fluid-bottle     Status: None (Preliminary result)   Collection Time: 03/14/18 12:15 PM  Result Value Ref Range Status   Specimen Description PERITONEAL  Final   Special Requests NONE  Final   Culture   Final    NO GROWTH 1 DAY Performed at [facility], [address] [place], [place] [address]    Report Status PENDING  Incomplete    RADIOLOGY  STUDIES/RESULTS: Ct Abdomen Pelvis W Contrast  Result Date: 03/13/2018 CLINICAL DATA:  Abdominal pain and distention with leg swelling for 3 days, history of alcohol use disorder EXAM: CT ABDOMEN AND PELVIS WITH CONTRAST TECHNIQUE: Multidetector CT imaging of the abdomen and pelvis was performed using the standard protocol following bolus administration of intravenous contrast. Sagittal and coronal MPR images reconstructed from axial data set. CONTRAST:  OMNIPAQUE IOHEXOL 300 MG/ML SOLN IV. No oral contrast. COMPARISON:  03/11/2016 FINDINGS: Lower chest: Tiny LEFT pleural effusion. Subsegmental atelectasis BILATERAL lower lobes Hepatobiliary: Thickened gallbladder wall, nonspecific in the setting of ascites. Fatty infiltration of liver with mild diffuse hepatic inhomogeneity. No discrete hepatic mass. Pancreas: Normal appearance Spleen: Normal appearance.  12.3 cm length. Adrenals/Urinary Tract: Adrenal glands, kidneys, ureters, and bladder normal appearance Stomach/Bowel: Normal appendix. Stomach and bowel loops grossly unremarkable for technique Vascular/Lymphatic: Vascular structures grossly patent. No adenopathy. Reproductive: Unremarkable  prostate gland and seminal vesicles Other: Large volume ascites.  Scattered soft tissue edema. Musculoskeletal: No acute osseous findings. IMPRESSION: Large volume ascites and scattered soft tissue edema. Fatty infiltration of liver with mild diffuse hepatic inhomogeneity; no definite hepatic nodularity is seen to suggest cirrhosis though this should be clinically considered. Electronically Signed   By: Ulyses Southward M.D.   On: 03/13/2018 18:04   US Abdomen Limited Ruq  Result Date: 03/14/2018 CLINICAL DATA:  Cirrhosis, abnormal gallbladder on CT EXAM: ULTRASOUND ABDOMEN LIMITED RIGHT UPPER QUADRANT COMPARISON:  CT 03/13/2018 FINDINGS: Gallbladder: No shadowing stones. Increased wall thickness at 6.7 mm. Negative sonographic Murphy. Common bile duct: Diameter: 4.8 mm Liver: Coarse echogenic and nodular consistent with cirrhosis. Portal vein is patent on color Doppler imaging with normal direction of blood flow towards the liver. Small to moderate ascites in the abdomen. IMPRESSION: 1. Thickened gallbladder wall without shadowing stone. Findings are nonspecific and could be secondary to acute or chronic cholecystitis, liver disease, or edema forming states. 2. Cirrhotic morphology of the liver with small moderate ascites Electronically Signed   By: Jasmine Pang M.D.   On: 03/14/2018 22:34   Ir Paracentesis  Result Date: 03/14/2018 INDICATION: Patient with history of alcohol use, now with abdominal distention due to ascites. Request is made for diagnostic and therapeutic paracentesis. EXAM: ULTRASOUND GUIDED DIAGNOSTIC AND THERAPEUTIC PARACENTESIS MEDICATIONS: 10 mL 1% lidocaine COMPLICATIONS: None immediate. PROCEDURE: Informed written consent was obtained from the patient after a discussion of the risks, benefits and alternatives to treatment. A timeout was performed prior to the initiation of the procedure. Initial ultrasound scanning demonstrates a moderate amount of ascites within the right lower abdominal  quadrant. The right lower abdomen was prepped and draped in the usual sterile fashion. 1% lidocaine was used for local anesthesia. Following this, a 19 gauge, 7-cm, Yueh catheter was introduced. An ultrasound image was saved for documentation purposes. The paracentesis was performed. The catheter was removed and a dressing was applied. The patient tolerated the procedure well without immediate post procedural complication. FINDINGS: A total of approximately 3.0 liters of yellow fluid was removed. Samples were sent to the laboratory as requested by the clinical team. IMPRESSION: Successful ultrasound-guided diagnostic and therapeutic paracentesis yielding 3.0 liters of peritoneal fluid. Read by: Loyce Dys PA-C Electronically Signed   By: Richarda Overlie M.D.   On: 03/14/2018 12:19     LOS: 3 days   Jeoffrey Massed, MD  [facility]  If 7PM-7AM, please contact night-coverage  Please page via [email or web address]  Go to [email or web address] and use 's universal password  to access. If you do not have the password, please contact the hospital operator.  Locate the [facility] provider you are looking for under [facility] and page to a number that you can be directly reached. If you still have difficulty reaching the provider, please page the [facility] (Director on Call) for the Hospitalists listed on amion for assistance.  03/16/2018, 9:51 AM

## 2018-03-16 NOTE — Plan of Care (Signed)

## 2018-03-17 ENCOUNTER — Encounter (HOSPITAL_COMMUNITY): Payer: Self-pay | Admitting: Gastroenterology

## 2018-03-17 ENCOUNTER — Encounter (HOSPITAL_COMMUNITY)
Admission: EM | Disposition: A | Discharge status: Home / Self Care | Payer: Self-pay | Source: Home / Self Care | Attending: Internal Medicine

## 2018-03-17 ENCOUNTER — Inpatient Hospital Stay (HOSPITAL_COMMUNITY): Payer: Self-pay | Admitting: Registered Nurse

## 2018-03-17 HISTORY — PX: ESOPHAGOGASTRODUODENOSCOPY (EGD) WITH PROPOFOL: SHX5813

## 2018-03-17 HISTORY — PX: COLONOSCOPY WITH PROPOFOL: SHX5780

## 2018-03-17 LAB — CBC
HCT: 30.3 % — ABNORMAL LOW (ref 39.0–52.0)
Hemoglobin: 8.8 g/dL — ABNORMAL LOW (ref 13.0–17.0)
MCH: 22.4 pg — ABNORMAL LOW (ref 26.0–34.0)
MCHC: 29 g/dL — AB (ref 30.0–36.0)
MCV: 77.3 fL — ABNORMAL LOW (ref 80.0–100.0)
Platelets: 53 10*3/uL — ABNORMAL LOW (ref 150–400)
RBC: 3.92 MIL/uL — ABNORMAL LOW (ref 4.22–5.81)
RDW: 21 % — ABNORMAL HIGH (ref 11.5–15.5)
WBC: 2.4 10*3/uL — ABNORMAL LOW (ref 4.0–10.5)
nRBC: 0 % (ref 0.0–0.2)

## 2018-03-17 SURGERY — ESOPHAGOGASTRODUODENOSCOPY (EGD) WITH PROPOFOL
Anesthesia: Monitor Anesthesia Care

## 2018-03-17 MED ORDER — SODIUM CHLORIDE 0.9 % IV SOLN: INTRAVENOUS | Status: DC

## 2018-03-17 MED ORDER — PROPOFOL 10 MG/ML IV BOLUS
INTRAVENOUS | Status: DC | PRN
  Administered 2018-03-17: 30 mg via INTRAVENOUS
  Administered 2018-03-17 (×2): 20 mg via INTRAVENOUS

## 2018-03-17 MED ORDER — FOLIC ACID 1 MG PO TABS: 1.0000 mg | ORAL_TABLET | Freq: Every day | ORAL | 0 refills | Status: DC

## 2018-03-17 MED ORDER — OMEPRAZOLE 20 MG PO CPDR: 20.0000 mg | DELAYED_RELEASE_CAPSULE | Freq: Every day | ORAL | 0 refills | Status: DC

## 2018-03-17 MED ORDER — PHENYLEPHRINE 40 MCG/ML (10ML) SYRINGE FOR IV PUSH (FOR BLOOD PRESSURE SUPPORT)
PREFILLED_SYRINGE | INTRAVENOUS | Status: DC | PRN
  Administered 2018-03-17: 80 ug via INTRAVENOUS

## 2018-03-17 MED ORDER — LIDOCAINE 2% (20 MG/ML) 5 ML SYRINGE
INTRAMUSCULAR | Status: DC | PRN
  Administered 2018-03-17: 60 mg via INTRAVENOUS

## 2018-03-17 MED ORDER — LACTATED RINGERS IV SOLN
INTRAVENOUS | Status: DC | PRN
  Administered 2018-03-17: 08:00:00 via INTRAVENOUS

## 2018-03-17 MED ORDER — PROPOFOL 500 MG/50ML IV EMUL
INTRAVENOUS | Status: DC | PRN
  Administered 2018-03-17: 75 ug/kg/min via INTRAVENOUS

## 2018-03-17 MED ORDER — THIAMINE HCL 100 MG PO TABS: 100.0000 mg | ORAL_TABLET | Freq: Every day | ORAL | 0 refills | Status: DC

## 2018-03-17 SURGICAL SUPPLY — 24 items

## 2018-03-17 NOTE — Op Note (Signed)
[facility] Patient Name: Mathew [name] Procedure Date : 03/17/2018 MRN: [id number] Attending MD: Bernette Redbird , MD Date of Birth: August 04, 1980 CSN: [id number] Age: 38 Admit Type: Inpatient Procedure:                Colonoscopy Indications:              This is the patient's first colonoscopy, Iron                            deficiency anemia in a patient with alcoholic liver                            disease and thrombocytopenia Providers:                Bernette Redbird, MD, Norman Clay, RN, Lawson Radar,                            Technician, Lillette Boxer, CRNA Referring MD:              Medicines:                Monitored Anesthesia Care Complications:            No immediate complications. Estimated Blood Loss:     Estimated blood loss was minimal. Procedure:                Pre-Anesthesia Assessment:                           - Prior to the procedure, a History and Physical                            was performed, and patient medications and                            allergies were reviewed. The patient's tolerance of                            previous anesthesia was also reviewed. The risks                            and benefits of the procedure and the sedation                            options and risks were discussed with the patient.                            All questions were answered, and informed consent                            was obtained. Prior Anticoagulants: The patient has                            taken no previous anticoagulant or antiplatelet  agents. ASA Grade Assessment: II - A patient with                            mild systemic disease. After reviewing the risks                            and benefits, the patient was deemed in                            satisfactory condition to undergo the procedure.                           After obtaining informed consent, the colonoscope                            was passed  under direct vision. Throughout the                            procedure, the patient's blood pressure, pulse, and                            oxygen saturations were monitored continuously. The                            CF-HQ190L ([id number] Olympus colonoscope was                            introduced through the anus and advanced to the the                            terminal ileum. The colonoscopy was performed                            without difficulty. The patient tolerated the                            procedure well. The quality of the bowel                            preparation was excellent. Scope In: 8:05:28 AM Scope Out: 8:19:26 AM Scope Withdrawal Time: 0 hours 9 minutes 49 seconds  Total Procedure Duration: 0 hours 13 minutes 58 seconds  Findings:      The perianal and digital rectal examinations were normal. Pertinent       negatives include normal prostate (size, shape, and consistency).      Mildly friable mucosa with contact bleeding was found in the entire       colon.      No other significant abnormalities were identified in a careful       examination of the remainder of the colon.      There is no endoscopic evidence of diverticula, inflammation, mass,       polyps or angiodysplasia in the entire colon.      The terminal ileum appeared normal.      The retroflexed view of the distal rectum and anal  verge was normal and       showed no anal or rectal abnormalities. Reinspection of the rectum       showed no additional findings. Impression:               - Friability with contact bleeding in the entire                            examined colon.                           - The examined portion of the ileum was normal.                           - The distal rectum and anal verge are normal on                            retroflexion view.                           - No specimens collected. Recommendation:           - Consider repeat colonoscopy, or alternative                             screening, in 10 years for screening purposes at                            discretion of patient's PCP. Procedure Code(s):        --- Professional ---                           [phone], Colonoscopy, flexible; diagnostic, including                            collection of specimen(s) by brushing or washing,                            when performed (separate procedure) Diagnosis Code(s):        --- Professional ---                           D50.9, Iron deficiency anemia, unspecified CPT copyright 2018 [employer]. All rights reserved. The codes documented in this report are preliminary and upon coder review may  be revised to meet current compliance requirements. Bernette Redbird, MD 03/17/2018 8:38:39 AM This report has been signed electronically. Number of Addenda: 0

## 2018-03-17 NOTE — Interval H&P Note (Signed)
History and Physical Interval Note:  03/17/2018 7:38 AM  [employer]  has presented today for surgery, with the diagnosis of Iron deficiency anemia.  The various methods of treatment have been discussed with the patient. After consideration of risks, benefits and other options for treatment, the patient has consented to  Procedure(s): ESOPHAGOGASTRODUODENOSCOPY (EGD) WITH PROPOFOL (N/A) COLONOSCOPY WITH PROPOFOL (N/A) as a surgical intervention.  The patient's history has been reviewed, patient examined, no change in status, stable for surgery.  I have reviewed the patient's chart and labs.  Questions were answered to the patient's satisfaction.     Katy Fitch Deandrew Hoecker

## 2018-03-17 NOTE — Discharge Summary (Signed)
PATIENT DETAILS Name: Mathew [name] PatientJuan [name] Age: 38 y.o. Sex: male Date of Birth: [birth year] MRN: [id number]. Admitting Physician: Hillary BowJared M Gardner, DO WUJ:[id number]:Patient, No Pcp Per  Admit Date: 03/13/2018 Discharge date: 03/17/2018  Recommendations for Outpatient Follow-up:  1. Follow up with PCP in 1-2 weeks 2. Please obtain BMP/CBC in one week 3. If patient is compliant to follow-up-and [facility]-consider starting propanolol, and low-dose diuretics. 4. Continue to counsel regarding importance of complete abstinence from alcohol use  Admitted From:  Home  Disposition: Home    Home Health: No  Equipment/Devices: None  Discharge Condition: Stable  CODE STATUS: FULL CODE  Diet recommendation:  Low-salt diet  Brief Summary: See H&P, Labs, Consult and Test reports for all details in brief, Patient is a 38 y.o. male with history of alcohol use presenting with abdominal pain and hematemesis, found to have acute blood loss anemia.  See below for further details  Brief Hospital Course: Upper GI bleeding with acute blood loss anemia: Upper GI bleeding has resolved-hemoglobin stable after 3 units of PRBC transfusion.  Continue PPI infusion, evaluated by GI, underwent EGD/colonoscopy on 3/7.  EGD showed portal hypertensive gastropathy, grade 2 esophageal varices.  Spoke with Dr. Matthias HughsBuccini over the phone-recommends that if patient is compliant with follow-up-we will initiate propanolol.  Extensive discussion with patient-with RN at bedside-extensive counseling has been done not only today but also over the past few days regarding importance of complete abstinence from alcohol.  EtOH abuse: Last drink on 3/3-PT awake and alert-no tremors.  Managed with Ativan per protocol.  Has been counseled extensively throughout this hospital course regarding importance of completely abstaining from alcohol.  He is aware of the life-threatening and life  disabling effects of further alcohol use.  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  Follow periodically in the outpatient setting.  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  Counseled extensively regarding importance of abstaining from alcohol use.  If he does indeed follow-up and is compliant to follow-up appointments in the future-gastroenterology recommends initiation of low-dose diuretics, and propanolol.  Procedures/Studies: 3/7>> EGD and colonoscopy 3/4>> paracentesis  Discharge Diagnoses:  Principal Problem:   Hematemesis Active Problems:   ETOH abuse   Thrombocytopenia (HCC)   Acute blood loss anemia   Alcoholic cirrhosis of liver with ascites [facility](HCC)   Discharge Instructions:  Activity:  As tolerated   Discharge Instructions    Call MD for:   Complete by:  As directed    Black or bloody stools   Diet - low sodium heart healthy   Complete by:  As directed    Discharge instructions   Complete by:  As directed    [facility].  Stop further alcohol use  Be compliant with low-salt diet and fluid restriction.  Please get a complete blood count and chemistry panel checked by your Primary MD at your next visit, and again as instructed by your Primary MD.  Get Medicines reviewed and adjusted: Please take all your medications with you for your next visit with your Primary MD  Laboratory/radiological data: Please request your Primary MD to go over all hospital tests and procedure/radiological results at the follow up, please ask your Primary MD to get all Hospital records sent to his/her office.  In some cases, they will be blood work, cultures and biopsy  results pending at the time of your discharge. Please request that your primary care M.D. follows up on these results.  Also Note the  following: If you experience worsening of your admission symptoms, develop shortness of breath, life threatening emergency, suicidal or homicidal thoughts you must seek medical attention immediately by calling 911 or calling your MD immediately  if symptoms less severe.  You must read complete instructions/literature along with all the possible adverse reactions/side effects for all the Medicines you take and that have been prescribed to you. Take any new Medicines after you have completely understood and accpet all the possible adverse reactions/side effects.   Do not drive when taking Pain medications or sleeping medications (Benzodaizepines)  Do not take more than prescribed Pain, Sleep and Anxiety Medications. It is not advisable to combine anxiety,sleep and pain medications without talking with your primary care practitioner  Special Instructions: If you have smoked or chewed Tobacco  in the last 2 yrs please stop smoking, stop any regular Alcohol  and or any Recreational drug use.  Wear Seat belts while driving.  Please note: You were cared for by a hospitalist during your hospital stay. Once you are discharged, your primary care physician will handle any further medical issues. Please note that NO REFILLS for any discharge medications will be authorized once you are discharged, as it is imperative that you return to your primary care physician (or establish a relationship with a primary care physician if you do not have one) for your post hospital discharge needs so that they can reassess your need for medications and monitor your lab values.   Increase activity slowly   Complete by:  As directed      Allergies as of 03/17/2018   No Known Allergies     Medication List    STOP taking these medications   CALCIUM PO   LORazepam 2 MG tablet Commonly known as:  ATIVAN   naltrexone 50 MG tablet Commonly known as:  DEPADE     TAKE these medications   folic acid 1 MG tablet Commonly  known as:  FOLVITE Take 1 tablet (1 mg total) by mouth daily. Start taking on:  March 18, 2018   omeprazole 20 MG capsule Commonly known as:  PriLOSEC Take 1 capsule (20 mg total) by mouth daily.   thiamine 100 MG tablet Take 1 tablet (100 mg total) by mouth daily. Start taking on:  March 18, 2018 What changed:  when to take this      Follow-up Information    [facility]. Go on 04/10/2018.   Why:  post hospital follow up 04/10/2018 @ 10 am with Dr. Shan Levans Contact information: [address] [place] [address] [phone]         No Known Allergies  Consultations:   GI   Other Procedures/Studies: Ct Abdomen Pelvis W Contrast  Result Date: 03/13/2018 CLINICAL DATA:  Abdominal pain and distention with leg swelling for 3 days, history of alcohol use disorder EXAM: CT ABDOMEN AND PELVIS WITH CONTRAST TECHNIQUE: Multidetector CT imaging of the abdomen and pelvis was performed using the standard protocol following bolus administration of intravenous contrast. Sagittal and coronal MPR images reconstructed from axial data set. CONTRAST:  OMNIPAQUE IOHEXOL 300 MG/ML SOLN IV. No oral contrast. COMPARISON:  03/11/2016 FINDINGS: Lower chest: Tiny LEFT pleural effusion. Subsegmental atelectasis BILATERAL lower lobes Hepatobiliary: Thickened gallbladder wall, nonspecific in the setting of ascites. Fatty infiltration of liver with  mild diffuse hepatic inhomogeneity. No discrete hepatic mass. Pancreas: Normal appearance Spleen: Normal appearance.  12.3 cm length. Adrenals/Urinary Tract: Adrenal glands, kidneys, ureters, and bladder normal appearance Stomach/Bowel: Normal appendix. Stomach and bowel loops grossly unremarkable for technique Vascular/Lymphatic: Vascular structures grossly patent. No adenopathy. Reproductive: Unremarkable prostate gland and seminal vesicles Other: Large volume ascites.  Scattered soft tissue edema.  Musculoskeletal: No acute osseous findings. IMPRESSION: Large volume ascites and scattered soft tissue edema. Fatty infiltration of liver with mild diffuse hepatic inhomogeneity; no definite hepatic nodularity is seen to suggest cirrhosis though this should be clinically considered. Electronically Signed   By: Ulyses Southward M.D.   On: 03/13/2018 18:04   US Abdomen Limited Ruq  Result Date: 03/14/2018 CLINICAL DATA:  Cirrhosis, abnormal gallbladder on CT EXAM: ULTRASOUND ABDOMEN LIMITED RIGHT UPPER QUADRANT COMPARISON:  CT 03/13/2018 FINDINGS: Gallbladder: No shadowing stones. Increased wall thickness at 6.7 mm. Negative sonographic Murphy. Common bile duct: Diameter: 4.8 mm Liver: Coarse echogenic and nodular consistent with cirrhosis. Portal vein is patent on color Doppler imaging with normal direction of blood flow towards the liver. Small to moderate ascites in the abdomen. IMPRESSION: 1. Thickened gallbladder wall without shadowing stone. Findings are nonspecific and could be secondary to acute or chronic cholecystitis, liver disease, or edema forming states. 2. Cirrhotic morphology of the liver with small moderate ascites Electronically Signed   By: Jasmine Pang M.D.   On: 03/14/2018 22:34   Ir Paracentesis  Result Date: 03/14/2018 INDICATION: Patient with history of alcohol use, now with abdominal distention due to ascites. Request is made for diagnostic and therapeutic paracentesis. EXAM: ULTRASOUND GUIDED DIAGNOSTIC AND THERAPEUTIC PARACENTESIS MEDICATIONS: 10 mL 1% lidocaine COMPLICATIONS: None immediate. PROCEDURE: Informed written consent was obtained from the patient after a discussion of the risks, benefits and alternatives to treatment. A timeout was performed prior to the initiation of the procedure. Initial ultrasound scanning demonstrates a moderate amount of ascites within the right lower abdominal quadrant. The right lower abdomen was prepped and draped in the usual sterile fashion. 1%  lidocaine was used for local anesthesia. Following this, a 19 gauge, 7-cm, Yueh catheter was introduced. An ultrasound image was saved for documentation purposes. The paracentesis was performed. The catheter was removed and a dressing was applied. The patient tolerated the procedure well without immediate post procedural complication. FINDINGS: A total of approximately 3.0 liters of yellow fluid was removed. Samples were sent to the laboratory as requested by the clinical team. IMPRESSION: Successful ultrasound-guided diagnostic and therapeutic paracentesis yielding 3.0 liters of peritoneal fluid. Read by: Loyce Dys PA-C Electronically Signed   By: Richarda Overlie M.D.   On: 03/14/2018 12:19      TODAY-DAY OF DISCHARGE:  Subjective:   Mathew [name] today has no headache,no chest abdominal pain,no new weakness tingling or numbness, feels much better wants to go home today.   Objective:   Blood pressure 112/67, pulse 65, temperature 97.9 F (36.6 C), temperature source Oral, resp. rate 17, height  (1.651 m), weight 77.1 kg, SpO2 100 %.  Intake/Output Summary (Last 24 hours) at 03/17/2018 1038 Last data filed at 03/17/2018 0825 Gross per 24 hour  Intake 1683.36 ml  Output 0 ml  Net 1683.36 ml   Filed Weights   03/13/18 1810  Weight: 77.1 kg    Exam: Awake Alert, Oriented *3, No new F.N deficits, Normal affect Farrell.AT,PERRAL Supple Neck,No JVD, No cervical lymphadenopathy appriciated.  Symmetrical Chest wall movement, Good air movement bilaterally, CTAB RRR,No Gallops,Rubs or  new Murmurs, No Parasternal Heave +ve B.Sounds, Abd Soft, Non tender, No organomegaly appriciated, No rebound -guarding or rigidity. No Cyanosis, Clubbing or edema, No new Rash or bruise   PERTINENT RADIOLOGIC STUDIES: Ct Abdomen Pelvis W Contrast  Result Date: 03/13/2018 CLINICAL DATA:  Abdominal pain and distention with leg swelling for 3 days, history of alcohol use disorder EXAM: CT ABDOMEN AND PELVIS WITH  CONTRAST TECHNIQUE: Multidetector CT imaging of the abdomen and pelvis was performed using the standard protocol following bolus administration of intravenous contrast. Sagittal and coronal MPR images reconstructed from axial data set. CONTRAST:  OMNIPAQUE IOHEXOL 300 MG/ML SOLN IV. No oral contrast. COMPARISON:  03/11/2016 FINDINGS: Lower chest: Tiny LEFT pleural effusion. Subsegmental atelectasis BILATERAL lower lobes Hepatobiliary: Thickened gallbladder wall, nonspecific in the setting of ascites. Fatty infiltration of liver with mild diffuse hepatic inhomogeneity. No discrete hepatic mass. Pancreas: Normal appearance Spleen: Normal appearance.  12.3 cm length. Adrenals/Urinary Tract: Adrenal glands, kidneys, ureters, and bladder normal appearance Stomach/Bowel: Normal appendix. Stomach and bowel loops grossly unremarkable for technique Vascular/Lymphatic: Vascular structures grossly patent. No adenopathy. Reproductive: Unremarkable prostate gland and seminal vesicles Other: Large volume ascites.  Scattered soft tissue edema. Musculoskeletal: No acute osseous findings. IMPRESSION: Large volume ascites and scattered soft tissue edema. Fatty infiltration of liver with mild diffuse hepatic inhomogeneity; no definite hepatic nodularity is seen to suggest cirrhosis though this should be clinically considered. Electronically Signed   By: Ulyses Southward M.D.   On: 03/13/2018 18:04   US Abdomen Limited Ruq  Result Date: 03/14/2018 CLINICAL DATA:  Cirrhosis, abnormal gallbladder on CT EXAM: ULTRASOUND ABDOMEN LIMITED RIGHT UPPER QUADRANT COMPARISON:  CT 03/13/2018 FINDINGS: Gallbladder: No shadowing stones. Increased wall thickness at 6.7 mm. Negative sonographic Murphy. Common bile duct: Diameter: 4.8 mm Liver: Coarse echogenic and nodular consistent with cirrhosis. Portal vein is patent on color Doppler imaging with normal direction of blood flow towards the liver. Small to moderate ascites in the abdomen.  IMPRESSION: 1. Thickened gallbladder wall without shadowing stone. Findings are nonspecific and could be secondary to acute or chronic cholecystitis, liver disease, or edema forming states. 2. Cirrhotic morphology of the liver with small moderate ascites Electronically Signed   By: Jasmine Pang M.D.   On: 03/14/2018 22:34   Ir Paracentesis  Result Date: 03/14/2018 INDICATION: Patient with history of alcohol use, now with abdominal distention due to ascites. Request is made for diagnostic and therapeutic paracentesis. EXAM: ULTRASOUND GUIDED DIAGNOSTIC AND THERAPEUTIC PARACENTESIS MEDICATIONS: 10 mL 1% lidocaine COMPLICATIONS: None immediate. PROCEDURE: Informed written consent was obtained from the patient after a discussion of the risks, benefits and alternatives to treatment. A timeout was performed prior to the initiation of the procedure. Initial ultrasound scanning demonstrates a moderate amount of ascites within the right lower abdominal quadrant. The right lower abdomen was prepped and draped in the usual sterile fashion. 1% lidocaine was used for local anesthesia. Following this, a 19 gauge, 7-cm, Yueh catheter was introduced. An ultrasound image was saved for documentation purposes. The paracentesis was performed. The catheter was removed and a dressing was applied. The patient tolerated the procedure well without immediate post procedural complication. FINDINGS: A total of approximately 3.0 liters of yellow fluid was removed. Samples were sent to the laboratory as requested by the clinical team. IMPRESSION: Successful ultrasound-guided diagnostic and therapeutic paracentesis yielding 3.0 liters of peritoneal fluid. Read by: Loyce Dys PA-C Electronically Signed   By: Richarda Overlie M.D.   On: 03/14/2018 12:19  PERTINENT LAB RESULTS: CBC: Recent Labs    03/16/18 0512 03/17/18 0310  WBC 2.9* 2.4*  HGB 9.1* 8.8*  HCT 30.4* 30.3*  PLT 57* 53*   CMET CMP     Component Value Date/Time    NA 134 (L) 03/16/2018 0512   NA 142 10/21/2016 0952   K 3.9 03/16/2018 0512   CL 108 03/16/2018 0512   CO2 20 (L) 03/16/2018 0512   GLUCOSE 79 03/16/2018 0512   BUN <5 (L) 03/16/2018 0512   BUN 6 10/21/2016 0952   CREATININE 0.70 03/16/2018 0512   CALCIUM 7.8 (L) 03/16/2018 0512   PROT 5.3 (L) 03/16/2018 0512   PROT 6.9 10/21/2016 0952   ALBUMIN 2.2 (L) 03/16/2018 0512   ALBUMIN 3.4 (L) 10/21/2016 0952   AST 38 03/16/2018 0512   ALT 19 03/16/2018 0512   ALKPHOS 84 03/16/2018 0512   BILITOT 2.9 (H) 03/16/2018 0512   BILITOT 0.7 10/21/2016 0952   GFRNONAA >60 03/16/2018 0512   GFRAA >60 03/16/2018 0512    GFR Estimated Creatinine Clearance: 118.7 mL/min (by C-G formula based on SCr of 0.7 mg/dL). No results for input(s): LIPASE, AMYLASE in the last 72 hours. No results for input(s): CKTOTAL, CKMB, CKMBINDEX, TROPONINI in the last 72 hours. Invalid input(s): POCBNP No results for input(s): DDIMER in the last 72 hours. No results for input(s): HGBA1C in the last 72 hours. No results for input(s): CHOL, HDL, LDLCALC, TRIG, CHOLHDL, LDLDIRECT in the last 72 hours. No results for input(s): TSH, T4TOTAL, T3FREE, THYROIDAB in the last 72 hours.  Invalid input(s): FREET3 No results for input(s): VITAMINB12, FOLATE, FERRITIN, TIBC, IRON, RETICCTPCT in the last 72 hours. Coags: No results for input(s): INR in the last 72 hours.  Invalid input(s): PT Microbiology: Recent Results (from the past 240 hour(s))  Gram stain     Status: None   Collection Time: 03/14/18 12:15 PM  Result Value Ref Range Status   Specimen Description PERITONEAL  Final   Special Requests NONE  Final   Gram Stain   Final    RARE WBC PRESENT,BOTH PMN AND MONONUCLEAR NO ORGANISMS SEEN Performed at [facility], [address], [place], [place] [address]    Report Status 03/14/2018 FINAL  Final  Culture, body fluid-bottle     Status: None (Preliminary result)   Collection Time: 03/14/18 12:15 PM    Result Value Ref Range Status   Specimen Description PERITONEAL  Final   Special Requests NONE  Final   Culture   Final    NO GROWTH 3 DAYS Performed at [facility], [address], [place], [place] [address]    Report Status PENDING  Incomplete    FURTHER DISCHARGE INSTRUCTIONS:  Get Medicines reviewed and adjusted: Please take all your medications with you for your next visit with your Primary MD  Laboratory/radiological data: Please request your Primary MD to go over all hospital tests and procedure/radiological results at the follow up, please ask your Primary MD to get all Hospital records sent to his/her office.  In some cases, they will be blood work, cultures and biopsy results pending at the time of your discharge. Please request that your primary care M.D. goes through all the records of your hospital data and follows up on these results.  Also Note the following: If you experience worsening of your admission symptoms, develop shortness of breath, life threatening emergency, suicidal or homicidal thoughts you must seek medical attention immediately by calling 911 or  calling your MD immediately  if symptoms less severe.  You must read complete instructions/literature along with all the possible adverse reactions/side effects for all the Medicines you take and that have been prescribed to you. Take any new Medicines after you have completely understood and accpet all the possible adverse reactions/side effects.   Do not drive when taking Pain medications or sleeping medications (Benzodaizepines)  Do not take more than prescribed Pain, Sleep and Anxiety Medications. It is not advisable to combine anxiety,sleep and pain medications without talking with your primary care practitioner  Special Instructions: If you have smoked or chewed Tobacco  in the last 2 yrs please stop smoking, stop any regular Alcohol  and or any Recreational drug use.  Wear Seat belts while  driving.  Please note: You were cared for by a hospitalist during your hospital stay. Once you are discharged, your primary care physician will handle any further medical issues. Please note that NO REFILLS for any discharge medications will be authorized once you are discharged, as it is imperative that you return to your primary care physician (or establish a relationship with a primary care physician if you do not have one) for your post hospital discharge needs so that they can reassess your need for medications and monitor your lab values.  Total Time spent coordinating discharge including counseling, education and face to face time equals 25 minutes.  SignedJeoffrey Massed 03/17/2018 10:38 AM

## 2018-03-17 NOTE — Anesthesia Preprocedure Evaluation (Addendum)
Anesthesia Evaluation  Patient identified by MRN, date of birth, ID band Patient awake    Reviewed: Allergy & Precautions, NPO status , Patient's Chart, lab work & pertinent test results  History of Anesthesia Complications Negative for: history of anesthetic complications  Airway Mallampati: II  TM Distance: >3 FB Neck ROM: Full    Dental  (+) Poor Dentition, Dental Advisory Given   Pulmonary neg pulmonary ROS,    breath sounds clear to auscultation       Cardiovascular negative cardio ROS   Rhythm:Regular     Neuro/Psych Seizures -,  PSYCHIATRIC DISORDERS etoh withdrawal seizures    GI/Hepatic (+)     substance abuse  alcohol use, ? GI bleed   Endo/Other  negative endocrine ROS  Renal/GU negative Renal ROS     Musculoskeletal negative musculoskeletal ROS (+)   Abdominal   Peds  Hematology  (+) anemia ,   Anesthesia Other Findings   Reproductive/Obstetrics                             Anesthesia Physical Anesthesia Plan  ASA: II  Anesthesia Plan: MAC   Post-op Pain Management:    Induction:   PONV Risk Score and Plan: 1 and Treatment may vary due to age or medical condition and Propofol infusion  Airway Management Planned: Nasal Cannula  Additional Equipment: None  Intra-op Plan:   Post-operative Plan:   Informed Consent: I have reviewed the patients History and Physical, chart, labs and discussed the procedure including the risks, benefits and alternatives for the proposed anesthesia with the patient or authorized representative who has indicated his/her understanding and acceptance.     Dental advisory given  Plan Discussed with: CRNA and Surgeon  Anesthesia Plan Comments:         Anesthesia Quick Evaluation

## 2018-03-17 NOTE — Discharge Instructions (Signed)
Cirrosis Cirrhosis  La cirrosis es una lesin de largo plazo (crnica) en el hgado. El hgado es el rgano interno ms grande del cuerpo, y cumple muchas funciones. Este rgano transforma los [employer], elimina las sustancias txicas de la [place], [place] protenas importantes y absorbe las vitaminas necesarias de los alimentos. En la cirrosis, las clulas hepticas son reemplazadas por tejido cicatricial. Esto impide que la sangre circule por el hgado, lo que dificulta el funcionamiento de este rgano. La fibrosis heptica no es reversible, pero el tratamiento puede evitar que empeore. Cules son las causas? Las causas ms comunes de la cirrosis son la hepatitisC y el consumo prolongado de alcohol. Otras causas son:  Enfermedad por hgado graso no alcohlico. Esto sucede cuando la grasa se deposita en el hgado por otras causas que no sean el alcohol.  Infeccin por hepatitisB.  Hepatitis autoinmune. En esta enfermedad, el sistema de defensa del cuerpo (sistema inmunitario) ataca por error a las clulas del hgado, lo que causa irritacin e hinchazn (inflamacin).  Enfermedades que [employer] conductos dentro del hgado.  Enfermedades hepticas hereditarias, como la hemocromatosis. Esta es una de las enfermedades hepticas hereditarias ms comunes. En esta enfermedad, depsitos de hierro se acumulan en el hgado y otros rganos.  Reacciones a determinados medicamentos a Barrister's clerk, como la [place], un medicamento para Film/video editor.  Infecciones por parsitos. Estas incluyen la esquistosomiasis, que es una enfermedad causada por un platelminto.  Contacto prolongado con ciertas sustancias txicas. Estas sustancias txicas incluyen ciertos solventes orgnicos, tales como cloroformo y tolueno. Qu incrementa el riesgo? Es ms probable que usted sufra esta afeccin si:  Tiene ciertos tipos de hepatitis viral.  Consume alcohol en exceso, especialmente si es  mujer.  Tiene sobrepeso.  Comparte agujas.  Tiene relaciones sexuales sin usar proteccin con alguien que tiene una hepatitis viral. Cules son los signos o los sntomas? Es posible que no tenga signos ni sntomas al principio. Puede que los sntomas no se manifiesten hasta que el dao heptico empiece a Copy. Entre los primeros sntomas se pueden incluir los siguientes:  Debilidad y cansancio (fatiga).  Cambios en los patrones de sueo o dificultad para dormir.  Picazn.  Dolor a Doctor, hospital parte superior derecha del abdomen.  Adelgazamiento y prdida de masa muscular.  Nuseas.  Prdida del apetito.  Aparicin de vasos sanguneos diminutos debajo de la piel. Entre los sntomas que aparecen en una etapa posterior se pueden incluir los siguientes:  Fatiga o debilidad que [place].  Piel y ojos amarillos (ictericia).  Acumulacin de lquido en el abdomen (ascitis). Puede notar que la ropa se le aprieta alrededor de la cintura.  Aumento de [place].  Hinchazn de los pies y los tobillos (edema).  Dificultad para respirar.  Tener hematomas o hemorragias.  Vmitos de [place].  Heces con sangre o de color negro.  Confusin mental. Cmo se diagnostica? El mdico puede sospechar la presencia de cirrosis en funcin de los sntomas y la historia clnica, en especial si tiene otras enfermedades o antecedentes de consumo de alcohol. El [employer] har un examen fsico para palparle el hgado y buscar signos de cirrosis. Tambin podr realizarle otras pruebas, por ejemplo:  Anlisis de sangre para controlar: ? Si tiene hepatitis B o C. ? La funcin renal. ? La funcin heptica.  Pruebas de diagnstico por imgenes, por ejemplo: ? Resonancia magntica (RM) o exploracin por tomografa computarizada (TC) para buscar los cambios que se observan en los casos de cirrosis  avanzada. ? Ecografa para determinar si el tejido heptico normal est siendo reemplazado por  tejido cicatricial.  Un procedimiento en el que se [place] una aguja larga para tomar Truddie Coco de tejido heptico para estudiarlo en un laboratorio (biopsia). La biopsia de hgado puede confirmar el diagnstico de cirrosis. Cmo se trata? El tratamiento de esta afeccin depende de la magnitud del dao heptico y qu lo caus. Puede incluir el tratamiento de los sntomas de la cirrosis o de las causas preexistentes, a fin de Psychologist, clinical el avance del [place]. El tratamiento puede incluir lo siguiente:  Cambios de estilo de vida, como: ? Consumir una dieta saludable. Puede tener que consultar al mdico o a un especialista en alimentacin y nutricin (nutricionista) a fin de Tax adviser de alimentacin. ? Restringir la ingesta de sal. ? Mantener un peso saludable. ? No consumir drogas o alcohol.  Tomar medicamentos para: ? Risk manager las infecciones del hgado u otras infecciones. ? Controlar la picazn. ? Disminuir la acumulacin de lquido. ? Reducir ciertas sustancias txicas de la sangre. ? Reducir el riesgo de hemorragia de los vasos sanguneos agrandados en el estmago o el esfago (vrices).  Trasplante de hgado. En este procedimiento, el hgado de un donante se [place] para reemplazar el hgado enfermo. Esto se realiza si la cirrosis ha ocasionado insuficiencia heptica. Otros tratamientos y procedimientos, segn los problemas que usted tenga debido a la cirrosis. Los problemas frecuentes incluyen insuficiencia renal relacionada con el hgado (sndrome hepatorenal). Siga estas indicaciones en su casa:   Tome los medicamentos solamente como se lo haya indicado el mdico. No use medicamentos que sean txicos para el hgado. Consulte al mdico antes de tomar algn medicamento nuevo, incluidos los de [place].  Descanse todo lo que sea necesario.  Mantenga una dieta bien balanceada. Solicite ms informacin al mdico o al nutricionista.  Limite el consumo de sal o de agua,  si el mdico le indica que lo haga.  No beba alcohol. Esto es especialmente importante si est tomando paracetamol.  Concurra a todas las visitas de seguimiento como se lo haya indicado el mdico. Esto es importante. Comunquese con un mdico si:  Tiene fatiga o debilidad que empeora.  Observa que se le [employer], los pies, las piernas o la cara.  Tiene fiebre.  Pierde el apetito.  Siente nuseas o vmitos.  Tiene ictericia.  Se le forman hematomas o sangra con facilidad. Solicite ayuda inmediatamente si:  Vomita sangre de color rojo brillante o una sustancia parecida a los granos de caf.  Tiene [employer].  Nota que las heces son de color negro y de aspecto alquitranado.  Comienza a sentirse confundido.  Tiene dolor en el pecho o dificultad para respirar. Resumen  La cirrosis es una lesin crnica en el hgado. El dao heptico no puede revertirse. Las causas ms comunes son la hepatitisC y el consumo prolongado de alcohol.  Las pruebas para diagnosticar la cirrosis incluyen anlisis de [place], estudios de diagnstico por imgenes y biopsia del hgado.  El tratamiento de la afeccin incluye el tratamiento de la afeccin preexistente. Evite el consumo de alcohol, drogas, sal y los medicamentos que pueden daar el hgado.  Comunquese con su mdico si presenta ascitis, edema, ictericia, fiebre, nuseas o vmitos, formacin de hematomas o sangrado con facilidad, o empeoramiento de la fatiga. Esta informacin no tiene Marine scientist el consejo del mdico. Asegrese de hacerle al mdico cualquier pregunta que tenga. Document Released: 12/27/2004 Document  Revised: 01/20/2017 Document Reviewed: 01/20/2017 Elsevier Interactive Patient Education  2019 Elsevier Inc.   Enfermedad heptica por alcoholismo Alcoholic Liver Disease Enfermedad heptica por alcoholismo hace referencia al dao en el hgado causado por beber alcohol en exceso durante un perodo  prolongado. El hgado es un rgano que cumple las siguientes funciones:  Ayuda a dividir Psychologist, educational) y Environmental health practitioner las grasas y otros nutrientes presentes en la sangre.  Contribuye a eliminar las sustancias nocivas de la [place].  Produce las partes de la sangre que ayudan a formar cogulos y prevenir el sangrado excesivo. Si el hgado se daa, puede dejar de funcionar correctamente. Hay tres tipos principales de enfermedad heptica por alcoholismo y, en general, ocurren en el siguiente orden. 1. Enfermedad del hgado graso. Se considera la etapa temprana de la enfermedad heptica por alcoholismo. Es una enfermedad en la que se acumula demasiada grasa en las clulas del hgado. En muchos casos, la enfermedad del hgado graso no provoca ningn sntoma. Con frecuencia, se diagnostica cuando se realizan anlisis de laboratorio por otros motivos. 2. Hepatitis alcohlica. Es una inflamacin del hgado que disminuye su capacidad de funcionar normalmente. 3. Cirrosis por alcohol. La cirrosis es una lesin de largo plazo (crnica) en el hgado. En las personas que tienen cirrosis, muchas de las clulas hepticas sanas han sido reemplazadas por tejido cicatricial. Esto impide que la sangre circule por el hgado, lo que dificulta el funcionamiento de este rgano. Generalmente, los sntomas de esta afeccin empeoran (progresan con el paso del tiempo si se sigue consumiendo alcohol. El tratamiento exige abandonar por completo el consumo de alcohol. Cules son las causas? La enfermedad heptica por alcoholismo es causada por el consumo excesivo de alcohol durante mucho tiempo (crnico). El hgado filtra el alcohol del torrente sanguneo. Cuando el alcohol se descompone en el hgado, libera ciertas sustancias qumicas perjudiciales (txicas) que daan las clulas hepticas. Qu incrementa el riesgo? Es ms probable que Dietitian en:  Las mujeres.  Las personas con antecedentes familiares de  enfermedad heptica por alcoholismo.  Las personas cuya alimentacin es deficiente.  Las [employer].  Las personas que beben grandes cantidades de alcohol, especialmente las que con frecuencia beben mucho durante perodos de tiempo cortos (borrachera).  Las personas que tienen una enfermedad heptica subyacente, como hepatitis B o hepatitis C.  Las personas que carecen de ciertos nutrientes, como folato o tiamina (tienen deficiencia de nutrientes). Cules son los signos o sntomas? La mayora de las personas no tienen sntomas en las etapas iniciales de esta enfermedad. Si hay sntomas tempranos, estos pueden ser los siguientes:  Prdida del apetito.  Nuseas y vmitos. Los sntomas de enfermedad moderada incluyen los siguientes:  Prdida del apetito.  Nuseas y vmitos.  Diarrea.  Prdida de peso sin proponrselo.  Fatiga.  Coloracin amarillenta de la piel y las partes blancas de los ojos (ictericia).  Dolor e hinchazn en el abdomen.  Grant Ruts. Los sntomas de enfermedad avanzada incluyen los siguientes:  Adelgazamiento y prdida de masa muscular.  Picazn en la piel.  Acumulacin de lquido en el abdomen (ascitis).  Hinchazn de los pies y los tobillos (edema).  Hemorragias nasales o encas sangrantes.  Deposiciones sanguinolentas.  Aumento del tamao de las puntas de los dedos [place]).  Dificultad para concentrarse.  Dificultad para dormir.  Cambios en el humor.  Agitacin.  Confusin. Algunas personas no tienen sntomas hasta que la enfermedad es grave. Con frecuencia, los sntomas empeoran inmediatamente despus de un perodo de consumo excesivo de  alcohol. Cmo se diagnostica? Esta afeccin se puede diagnosticar en funcin de lo siguiente:  Un examen fsico.  Anlisis de sangre.  Estudios que generan imgenes detalladas del cuerpo. Estos pueden incluir los siguientes: ? Ecografa del hgado. ? Exploracin por tomografa  computarizada (TC). ? Resonancia magntica (RM).  Biopsia de hgado. En [employer], se extrae una pequea [place] de tejido heptico y se examina para buscar signos de dao. Cmo se trata? La parte mas importante del tratamiento es dejar de consumir alcohol. Si es adicto al alcohol, el mdico lo ayudar a Doctor, hospital plan para dejarlo. Ese plan puede implicar lo siguiente:  Tomar medicamentos para aliviar los sntomas desagradables que se producen al dejar el alcohol o disminuir su consumo (sndrome de abstinencia).  Entrar a un programa de tratamiento que lo ayude a dejar de beber.  Unirse a un grupo de apoyo. El tratamiento de la enfermedad heptica por alcoholismo tambin puede incluir lo siguiente:  Terapia nutricional. El mdico o un especialista en dietas y alimentacin (nutricionista) puede recomendarle lo siguiente: ? Consumir una dieta saludable. ? Tomar vitaminas. ? Comer alimentos que contengan mucho zinc o vitaminas B, como folato, tiamina o piridoxina.  Medicamentos con corticoesteroides para reducir la inflamacin del hgado. Tal vez se le recomienden si su enfermedad es grave.  Recibir un hgado donado (trasplante de hgado). Esto solo se hace en casos muy graves y Public relations account executive en las personas que han dejado de beber de [place] y pueden comprometerse a no volver a beber alcohol nunca ms. Siga estas indicaciones en su casa:   No beba alcohol. Siga su plan de tratamiento y consulte con su mdico segn sea necesario.  Considere la posibilidad de Advertising account planner en un grupo de apoyo para Actor. Estos grupos pueden brindar apoyo emocional y orientacin.  Tome los medicamentos de venta libre y los recetados solamente como se lo haya indicado el mdico. Estos incluyen las vitaminas y los suplementos.  No use medicamentos ni consuma alimentos que contengan alcohol a menos que se lo indique el mdico.  Siga las indicaciones del mdico o del nutricionista  acerca de consumir alimentos saludables.  Concurra a todas las visitas de control como se lo haya indicado el mdico. Esto es importante. Comunquese con un mdico si:  Tiene fiebre.  La piel se le torna de un color ms amarillento, plida u oscura.  Comienza a sentir dolores de [place]. Solicite ayuda de inmediato si:  Vomita sangre.  Observa sangre brillante en las heces.  Las heces son negras, de aspecto alquitranado.  Tiene dificultad para hacer lo siguiente: ? Pensar. ? Caminar. ? Mantener el equilibrio. ? Respirar. Resumen  Enfermedad heptica por alcoholismo hace referencia al dao en el hgado causado por beber alcohol en exceso durante un perodo prolongado.  Generalmente, los sntomas de esta afeccin empeoran (progresan con el paso del tiempo si se sigue consumiendo alcohol.  El [employer] har un examen fsico y otros estudios para diagnosticar su enfermedad.  La parte mas importante del tratamiento es dejar de consumir alcohol. Siga su plan de tratamiento y consulte con su mdico segn sea necesario. Esta informacin no tiene Theme park manager el consejo del mdico. Asegrese de hacerle al mdico cualquier pregunta que tenga. Document Released: 05/13/2014 Document Revised: 11/21/2016 Document Reviewed: 11/21/2016 Elsevier Interactive Patient Education  2019 [employer].    Plan de alimentacin con bajo contenido de sodio Low-Sodium Eating Plan [place], que es uno de los elementos que constituyen la sal,  ayuda a Pharmacologist un equilibrio saludable de los fluidos corporales. Mucha cantidad de sodio puede aumentar la presin arterial y Radio producer que el organismo retenga lquidos y residuos. El mdico o el nutricionista podran recomendarle que siga este plan si tiene presin arterial alta (hipertensin), enfermedad renal, enfermedad heptica o insuficiencia cardaca. El consumo de una menor cantidad de sodio puede ayudar a [employer] presin arterial, reducir la  hinchazn y Physicist, medical, el hgado y los riones. Consejos para seguir [employer] plan [place] generales  La mayora de las personas que sigan este plan deben limitar la ingesta de sodio a entre 1500y2000mg (miligramos) por Futures trader. Lectura de las etiquetas de los alimentos   La etiqueta de informacin nutricional indica la cantidad de sodio en una porcin de alimento. Si come ms de una porcin, debe multiplicar la cantidad indicada de sodio por la cantidad de porciones.  Elija alimentos con menos de 140mg  de sodio por porcin.  Evite consumir alimentos que tengan 300mg  de sodio o ms por porcin. De compras  Busque productos con bajo contenido de sodio, en cuyas etiquetas suele indicarse bajo contenido de sodio o sin agregado de sal.  Siempre controle el contenido de sodio, incluso si en la etiqueta de los alimentos dice sin sal o sin agregado de sal.  Compre alimentos frescos. ? Evite los alimentos enlatados y comidas precocidas o congeladas. ? Evite las carnes enlatadas, curadas o procesadas.  Compre panes que tengan menos de 80mg  de sodio por [place]. Coccin  Consuma ms comida casera y menos de restaurante, de bufs y comida rpida.  Evite agregar sal cuando cocine. Use hierbas o aderezos sin sal, en lugar de sal de mesa o sal marina. Consulte al mdico o farmacutico antes de usar sustitutos de la sal.  Cocine con aceites de origen vegetal, como el de canola, girasol u [place]. Planificacin de las comidas  Cuando coma en un restaurante, pida que preparen su comida con menos sal o, en lo posible, sin nada de sal.  Evite consumir alimentos que contengan GMS (glutamato monosdico). El GMS suele agregarse a la comida [place], al consom y a algunos alimentos enlatados. Qu alimentos se recomiendan? Los alimentos enumerados a continuacin no constituyen Water quality scientist. Hable con el nutricionista sobre las mejores opciones alimenticias para  usted. Cereales Cereales con bajo contenido de sodio, como [place], arroz y trigo inflados, y trigo triturado. Galletas con bajo contenido de [place]. Arroz sin sal. Pastas sin sal. Pan con bajo contenido de sodio. Panes y pastas integrales. Verduras Verduras frescas o congeladas. Verduras enlatadas sin agregado de sal. Salsa de tomate y pastas sin agregado de sal. Jugos de tomate y verduras con bajo contenido de sodio o reducidos en sodio. Nils Pyle Frutas frescas, congeladas o enlatadas. Jugo de frutas. Carnes y otros alimentos proteicos Carne de vaca o ave, pescado y mariscos frescos o congelados (sin agregado de sal). Atn y salmn enlatado con bajo contenido de [place]. Frutos secos sin sal. Lentejas, frijoles y guisantes secos, sin agregado de sal. Frijoles enlatados sin sal. Huevos. Mantequillas de frutos secos sin sal. Lcteos Leche. Leche de soja. Quesos que, por naturaleza, tengan bajo contenido de sodio, por ejemplo, la ricota, la mozzarella fresca o el queso suizo; o quesos con bajo contenido de sodio o reducidos en sodio. Queso crema. Yogur. Grasas y aceites Mantequilla sin sal. Margarina sin sal ni grasas trans. Aceites vegetales, como de canola u oliva. Condimentos y otros alimentos Hierbas y especias frescas y secas. Aderezos sin sal. Mostaza y  ktchup con bajo contenido de [place]. Aderezos para ensalada sin sodio. Mayonesa light sin sodio. Rbano picante fresco o refrigerado. Jugo de limn. Vinagre. Sopas caseras o con contenido de sodio bajo o reducido. Palomitas de maz y pretzels sin sal. Papas fritas sin sal o con bajo contenido de sal. Qu alimentos no se recomiendan? Los alimentos enumerados a continuacin no constituyen Water quality scientist. Hable con el nutricionista sobre las mejores opciones alimenticias para usted. Cereales Cereales instantneos para comer caliente. Mezclas para bizcochos, panqueques y rellenos de pan. Crutones. Mezclas para pastas o arroz con condimento.  Envases comerciales de sopa de fideos. Macarrones con queso envasados o congelados. Galletas saladas comunes. Harina leudante. Verduras [place], verduras en escabeche y salsas. Aceitunas. Papas fritas. Anillos de cebollas. Verduras enlatadas regulares (que no sean con bajo contenido de sodio o reducidas en sodio). Pasta y salsa de tomates enlatadas regulares (que no sean con bajo contenido de sodio o reducidas en sodio). Jugos de tomate y verduras regulares (que no sean con bajo contenido de sodio o reducidos en sodio). Verduras Hydrologist. Carnes y otros alimentos proteicos Carne de vaca o pescado que est [place], [place], [place], condimentada o en escabeche. [place], jamn, salchichas, hotdogs, carne en conserva, carne ahumada, embutidos envasados, carne de cerdo salada, cecina, arenques en escabeche, anchoas, atn enlatado comn, sardinas, nueces saladas. Lcteos Quesos para untar y quesos procesados. Requesn. Queso azul. Queso feta. Queso en hebras. Queso cottage comn. Suero de [place]. Leche enlatada. Grasas y aceites Mantequilla con sal. Margarina comn. Mantequilla clarificada. Grasa de panceta. Condimentos y otros alimentos Sal de cebolla, sal de ajo, sal condimentada, sal de mesa y sal marina. Salsas en lata y envasadas. Salsa [place]. Salsa trtara. Salsa barbacoa. Salsa teriyaki. Salsa de soja, incluso la que tiene contenido reducido de [place]. Salsa de carne. Salsa de pescado. Salsa de [place]. Salsa rosada. Rbano picante envasado. [place] y [employer]. Saborizantes y tiernizantes para carne. Caldo en cubitos. Salsa picante y salsa tabasco. Escabeches envasados o ya preparados. Aderezos para tacos prefabricados o envasados. Salsas. Aderezos comunes para ensalada. Salsa. Nachos y papas fritas envasadas. Maz inflado y frituras de maz. Palomitas de maz y pretzels con sal. Sopas enlatadas o en polvo. Pizza. Pasteles y entradas congeladas. Resumen  El consumo de una menor  cantidad de sodio puede ayudar a [employer] presin arterial, reducir la hinchazn y Physicist, medical, el hgado y los riones.  La [employer] de las personas que sigan este plan deben limitar la ingesta de sodio a entre 1500y2000mg (miligramos) por Futures trader.  Los [employer], envasados y [address] tienen alto contenido de [place]. La pizza, la comida rpida y la comida de los restaurantes tambin contienen mucho sodio. Tambin aporta sodio al agregar sal a las comidas.  Intente cocinar en su hogar, coma ms frutas y verduras frescas, y coma menos comidas rpidas y alimentos enlatados, procesados o preparados. Esta informacin no tiene Theme park manager el consejo del mdico. Asegrese de hacerle al mdico cualquier pregunta que tenga. Document Released: 12/27/2004 Document Revised: 05/05/2016 Document Reviewed: 05/05/2016 Elsevier Interactive Patient Education  2019 [employer].

## 2018-03-17 NOTE — Progress Notes (Signed)
Mathew [name] to be D/C'd home per MD order. Discussed with the patient and all questions fully answered.   VVS, Skin clean, dry and intact without evidence of skin break down, no evidence of skin tears noted.  IV catheter discontinued intact. Site without signs and symptoms of complications. Dressing and pressure applied.  An After Visit Summary was printed and given to the patient.  Patient escorted via WC, and D/C home via private auto.  Jeralene Peters  03/17/2018 4:05 PM

## 2018-03-17 NOTE — Transfer of Care (Signed)
Immediate Anesthesia Transfer of Care Note  Patient: Mathew [name]  Procedure(s) Performed: ESOPHAGOGASTRODUODENOSCOPY (EGD) WITH PROPOFOL (N/A ) COLONOSCOPY WITH PROPOFOL (N/A )  Patient Location: PACU and Endoscopy Unit  Anesthesia Type:MAC  Level of Consciousness: awake and drowsy  Airway & Oxygen Therapy: Patient Spontanous Breathing and Patient connected to nasal cannula oxygen  Post-op Assessment: Report given to RN and Post -op Vital signs reviewed and stable  Post vital signs: Reviewed and stable  Last Vitals:  Vitals Value Taken Time  BP 108/57 03/17/2018  8:25 AM  Temp    Pulse 66 03/17/2018  8:25 AM  Resp 20 03/17/2018  8:25 AM  SpO2 100 % 03/17/2018  8:25 AM  Vitals shown include unvalidated device data.  Last Pain:  Vitals:   03/17/18 0728  TempSrc: Oral  PainSc: 0-No pain         Complications: No apparent anesthesia complications

## 2018-03-17 NOTE — Anesthesia Procedure Notes (Signed)
Date/Time: 03/17/2018 7:44 AM Performed by: Laruth Bouchard., CRNA Pre-anesthesia Checklist: Patient identified, Emergency Drugs available, Suction available, Patient being monitored and Timeout performed Patient Re-evaluated:Patient Re-evaluated prior to induction Oxygen Delivery Method: Nasal cannula Preoxygenation: Pre-oxygenation with 100% oxygen Induction Type: IV induction Placement Confirmation: positive ETCO2

## 2018-03-17 NOTE — Progress Notes (Signed)
Patient's EGD and colonoscopy today were well-tolerated.  Endoscopy shows, as expected, some esophageal varices and portal hypertensive gastropathy, the latter of which seems to be more likely as an explanation for his chronic anemia.  The varices were small to medium in size and I elected not to perform banding on them.  The patient's colonoscopy was negative.  Throughout the GI tract, there was mild contact bleeding consistent with friability related to the patient's known thrombocytopenia.  Recommendations:  1.  Advance to 2 g sodium diet 2.  Dietitian instruction in low-sodium diet 3.  The patient will need follow-up with a primary care physician [facility]?)  to monitor for alcohol cessation, fluid status and adjustment of diuretic regimen, referral in a year for repeat endoscopy for variceal assessment (if the patient is medically compliant). 4.  Discharge tomorrow may be appropriate, after review of his endoscopic findings and careful instruction in the need to avoid alcohol, restrict sodium, and follow-up with his primary physician.  Florencia Reasons, M.D. Pager [phone] If no answer or after 5 PM call [phone]

## 2018-03-17 NOTE — Op Note (Signed)
[facility] Patient Name: Mathew [name] Procedure Date : 03/17/2018 MRN: [id number] Attending MD: Bernette Redbird , MD Date of Birth: [birth year] CSN: [id number] Age: 38 Admit Type: Inpatient Procedure:                Upper GI endoscopy Indications:              Unexplained iron deficiency anemia, Minimal                            hematemesis in a patient with history of alcohol                            abuse and new-onset ascites Providers:                Bernette Redbird, MD, Norman Clay, RN, Lawson Radar,                            Technician, Lillette Boxer, CRNA Referring MD:              Medicines:                Monitored Anesthesia Care Complications:            No immediate complications. Estimated Blood Loss:     Estimated blood loss was minimal. Procedure:                Pre-Anesthesia Assessment:                           - Prior to the procedure, a History and Physical                            was performed, and patient medications and                            allergies were reviewed. The patient's tolerance of                            previous anesthesia was also reviewed. The risks                            and benefits of the procedure and the sedation                            options and risks were discussed with the patient.                            All questions were answered, and informed consent                            was obtained. Prior Anticoagulants: The patient has                            taken no previous anticoagulant or antiplatelet  agents. ASA Grade Assessment: II - A patient with                            mild systemic disease. After reviewing the risks                            and benefits, the patient was deemed in                            satisfactory condition to undergo the procedure.                           After obtaining informed consent, the endoscope was                            passed  under direct vision. Throughout the                            procedure, the patient's blood pressure, pulse, and                            oxygen saturations were monitored continuously. The                            GIF-H190 ([id number]) Olympus gastroscope was                            introduced through the mouth, and advanced to the                            second part of duodenum. The upper GI endoscopy was                            accomplished without difficulty. The patient                            tolerated the procedure well. Scope In: Scope Out: Findings:      Grade II varices were found in the lower third of the esophagus.      A 2 cm hiatal hernia was present.      The exam of the esophagus was otherwise normal.      Mild portal hypertensive gastropathy was found in the gastric fundus.      Mildly friable mucosa with contact bleeding was found in the gastric       fundus and in the gastric body.      The exam of the stomach was otherwise normal.      The cardia and gastric fundus were normal on retroflexion.      There is no endoscopic evidence of varices in the cardia.      There is no endoscopic evidence of ulceration or erosion in the entire       examined stomach.      The examined duodenum was normal. Impression:               - Grade II esophageal varices.                           -  2 cm hiatal hernia.                           - Portal hypertensive gastropathy.                           - Friable gastric mucosa.                           - Normal examined duodenum.                           - No specimens collected. Recommendation:           - Perform a colonoscopy today. Procedure Code(s):        --- Professional ---                           [phone], Esophagogastroduodenoscopy, flexible,                            transoral; diagnostic, including collection of                            specimen(s) by brushing or washing, when performed                             (separate procedure) Diagnosis Code(s):        --- Professional ---                           I85.00, Esophageal varices without bleeding                           K76.6, Portal hypertension                           D50.9, Iron deficiency anemia, unspecified CPT copyright 2018 American [employer] Association. All rights reserved. The codes documented in this report are preliminary and upon coder review may  be revised to meet current compliance requirements. Bernette Redbird, MD 03/17/2018 8:34:34 AM This report has been signed electronically. Number of Addenda: 0

## 2018-03-17 NOTE — Anesthesia Postprocedure Evaluation (Signed)
Anesthesia Post Note  Patient: [employer]  Procedure(s) Performed: ESOPHAGOGASTRODUODENOSCOPY (EGD) WITH PROPOFOL (N/A ) COLONOSCOPY WITH PROPOFOL (N/A )     Patient location during evaluation: Endoscopy Anesthesia Type: MAC Level of consciousness: awake and alert Pain management: pain level controlled Vital Signs Assessment: post-procedure vital signs reviewed and stable Respiratory status: spontaneous breathing, nonlabored ventilation, respiratory function stable and patient connected to nasal cannula oxygen Cardiovascular status: stable and blood pressure returned to baseline Postop Assessment: no apparent nausea or vomiting Anesthetic complications: no    Last Vitals:  Vitals:   03/17/18 0840 03/17/18 0949  BP: 122/78 112/67  Pulse: 69 65  Resp: 16 17  Temp:  36.6 C  SpO2: 100% 100%    Last Pain:  Vitals:   03/17/18 1002  TempSrc:   PainSc: 0-No pain                 Arlie Riker

## 2018-03-18 ENCOUNTER — Encounter (HOSPITAL_COMMUNITY): Payer: Self-pay | Admitting: Gastroenterology

## 2018-03-19 LAB — CULTURE, BODY FLUID W GRAM STAIN -BOTTLE: Culture: NO GROWTH

## 2018-04-09 NOTE — Progress Notes (Deleted)
   Subjective:    Patient ID: Mathew [name], male    DOB: [birth year], 38 y.o.   MRN: [id number]  Admit Date: 03/13/2018 Discharge date: 03/17/2018  Recommendations for Outpatient Follow-up:  1. Follow up with PCP in 1-2 weeks 2. Please obtain BMP/CBC in one week 3. If patient is compliant to follow-up-and [facility]-consider starting propanolol, and low-dose diuretics. 4. Continue to counsel regarding importance of complete abstinence from alcohol use  Admitted From:  Home  Disposition: Home    Home Health: No  Equipment/Devices: None  Discharge Condition: Stable  CODE STATUS: FULL CODE  Diet recommendation:  Low-salt diet  Brief Summary: See H&P, Labs, Consult and Test reports for all details in brief, Patient is a37 y.o.malewith history of alcohol use presenting with abdominal pain and hematemesis, found to have acute blood loss anemia. See below for further details  Brief Hospital Course: Upper GI bleeding with acute blood loss anemia:Upper GI bleeding has resolved-hemoglobin stable after 3 units of PRBC transfusion.  Continue PPI infusion, evaluated by GI, underwent EGD/colonoscopy on 3/7.  EGD showed portal hypertensive gastropathy, grade 2 esophageal varices.  Spoke with Dr. Matthias Hughs over the phone-recommends that if patient is compliant with follow-up-we will initiate propanolol.  Extensive discussion with patient-with RN at bedside-extensive counseling has been done not only today but also over the past few days regarding importance of complete abstinence from alcohol.  EtOH abuse: Last drink on 3/3-PT awake and alert-no tremors.  Managed with Ativan per protocol.  Has been counseled extensively throughout this hospital course regarding importance of completely abstaining from alcohol.  He is aware of the life-threatening and life disabling effects of further alcohol use.  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 Follow periodically in the outpatient setting.  Alcoholic liver cirrhosis:Has longstanding history of EtOH use-cirrhotic morphology on liver ultrasound. Counseled extensively regarding importance of abstaining from alcohol use.  If he does indeed follow-up and is compliant to follow-up appointments in the future-gastroenterology recommends initiation of low-dose diuretics, and propanolol.  Procedures/Studies: 3/7>> EGD and colonoscopy 3/4>> paracentesis  Discharge Diagnoses:  Principal Problem:   Hematemesis Active Problems:   ETOH abuse   Thrombocytopenia (HCC)   Acute blood loss anemia   Alcoholic cirrhosis of liver with ascites (HCC)       Review of Systems     Objective:   Physical Exam        Assessment & Plan:

## 2018-04-10 ENCOUNTER — Inpatient Hospital Stay: Payer: Self-pay | Admitting: Critical Care Medicine

## 2019-08-09 ENCOUNTER — Encounter (HOSPITAL_COMMUNITY): Payer: Self-pay | Admitting: *Deleted

## 2019-08-09 ENCOUNTER — Other Ambulatory Visit: Payer: Self-pay

## 2019-08-09 ENCOUNTER — Emergency Department (HOSPITAL_COMMUNITY)
Admission: EM | Admit: 2019-08-09 | Discharge: 2019-08-09 | Disposition: A | Discharge status: Home / Self Care | Payer: Self-pay | Attending: Emergency Medicine | Admitting: Emergency Medicine

## 2019-08-09 DIAGNOSIS — Z5321 Procedure and treatment not carried out due to patient leaving prior to being seen by health care provider: Secondary | ICD-10-CM | POA: Insufficient documentation

## 2019-08-09 DIAGNOSIS — R2243 Localized swelling, mass and lump, lower limb, bilateral: Secondary | ICD-10-CM | POA: Insufficient documentation

## 2019-08-09 DIAGNOSIS — F1099 Alcohol use, unspecified with unspecified alcohol-induced disorder: Secondary | ICD-10-CM | POA: Insufficient documentation

## 2019-08-09 DIAGNOSIS — R0602 Shortness of breath: Secondary | ICD-10-CM | POA: Insufficient documentation

## 2019-08-09 LAB — URINALYSIS, ROUTINE W REFLEX MICROSCOPIC
Bilirubin Urine: NEGATIVE
Glucose, UA: NEGATIVE mg/dL
Hgb urine dipstick: NEGATIVE
Ketones, ur: NEGATIVE mg/dL
Leukocytes,Ua: NEGATIVE
Nitrite: NEGATIVE
Protein, ur: NEGATIVE mg/dL
Specific Gravity, Urine: 1.003 — ABNORMAL LOW (ref 1.005–1.030)
pH: 6 (ref 5.0–8.0)

## 2019-08-09 LAB — COMPREHENSIVE METABOLIC PANEL
ALT: 26 U/L (ref 0–44)
AST: 70 U/L — ABNORMAL HIGH (ref 15–41)
Albumin: 2.7 g/dL — ABNORMAL LOW (ref 3.5–5.0)
Alkaline Phosphatase: 115 U/L (ref 38–126)
Anion gap: 11 (ref 5–15)
BUN: <5 mg/dL — ABNORMAL LOW (ref 6–20)
CO2: 21 mmol/L — ABNORMAL LOW (ref 22–32)
Calcium: 7.9 mg/dL — ABNORMAL LOW (ref 8.9–10.3)
Chloride: 107 mmol/L (ref 98–111)
Creatinine, Ser: 0.67 mg/dL (ref 0.61–1.24)
GFR calc Af Amer: >60 mL/min (ref >60)
GFR calc non Af Amer: >60 mL/min (ref >60)
Glucose, Bld: 124 mg/dL — ABNORMAL HIGH (ref 70–99)
Potassium: 3.4 mmol/L — ABNORMAL LOW (ref 3.5–5.1)
Sodium: 139 mmol/L (ref 135–145)
Total Bilirubin: 3.1 mg/dL — ABNORMAL HIGH (ref 0.3–1.2)
Total Protein: 6.8 g/dL (ref 6.5–8.1)

## 2019-08-09 LAB — CBC
HCT: 28.6 % — ABNORMAL LOW (ref 39.0–52.0)
Hemoglobin: 8.9 g/dL — ABNORMAL LOW (ref 13.0–17.0)
MCH: 29.4 pg (ref 26.0–34.0)
MCHC: 31.1 g/dL (ref 30.0–36.0)
MCV: 94.4 fL (ref 80.0–100.0)
Platelets: 33 10*3/uL — ABNORMAL LOW (ref 150–400)
RBC: 3.03 MIL/uL — ABNORMAL LOW (ref 4.22–5.81)
RDW: 17.7 % — ABNORMAL HIGH (ref 11.5–15.5)
WBC: 2.8 10*3/uL — ABNORMAL LOW (ref 4.0–10.5)
nRBC: 0 % (ref 0.0–0.2)

## 2019-08-09 LAB — LIPASE, BLOOD: Lipase: 43 U/L (ref 11–51)

## 2019-08-09 MED ORDER — SODIUM CHLORIDE 0.9% FLUSH: 3.0000 mL | Freq: Once | INTRAVENOUS | Status: DC

## 2019-08-09 NOTE — ED Notes (Signed)
Pt checked AMA due to wait time

## 2019-08-09 NOTE — ED Triage Notes (Signed)
Used interpretor line at triage. Pt requesting to have "his body checked". He then states hes had moderate leg swelling for extended time and mild sob with exertion. Eyes appear jaundice. Hx of heavy etoh daily but reports now only drinks 1 beer a day. No acute distress is noted at triage.

## 2019-09-03 ENCOUNTER — Emergency Department (HOSPITAL_COMMUNITY): Payer: Self-pay

## 2019-09-03 ENCOUNTER — Emergency Department (HOSPITAL_COMMUNITY)
Admission: EM | Admit: 2019-09-03 | Discharge: 2019-09-03 | Disposition: A | Discharge status: Home / Self Care | Payer: Self-pay | Attending: Emergency Medicine | Admitting: Emergency Medicine

## 2019-09-03 ENCOUNTER — Encounter (HOSPITAL_COMMUNITY): Payer: Self-pay

## 2019-09-03 ENCOUNTER — Other Ambulatory Visit: Payer: Self-pay

## 2019-09-03 DIAGNOSIS — F17200 Nicotine dependence, unspecified, uncomplicated: Secondary | ICD-10-CM | POA: Insufficient documentation

## 2019-09-03 DIAGNOSIS — K709 Alcoholic liver disease, unspecified: Secondary | ICD-10-CM | POA: Insufficient documentation

## 2019-09-03 DIAGNOSIS — R609 Edema, unspecified: Secondary | ICD-10-CM | POA: Insufficient documentation

## 2019-09-03 DIAGNOSIS — Z79899 Other long term (current) drug therapy: Secondary | ICD-10-CM | POA: Insufficient documentation

## 2019-09-03 LAB — BASIC METABOLIC PANEL
Anion gap: 8 (ref 5–15)
BUN: <5 mg/dL — ABNORMAL LOW (ref 6–20)
CO2: 24 mmol/L (ref 22–32)
Calcium: 7.7 mg/dL — ABNORMAL LOW (ref 8.9–10.3)
Chloride: 108 mmol/L (ref 98–111)
Creatinine, Ser: 0.63 mg/dL (ref 0.61–1.24)
GFR calc Af Amer: >60 mL/min (ref >60)
GFR calc non Af Amer: >60 mL/min (ref >60)
Glucose, Bld: 120 mg/dL — ABNORMAL HIGH (ref 70–99)
Potassium: 3.2 mmol/L — ABNORMAL LOW (ref 3.5–5.1)
Sodium: 140 mmol/L (ref 135–145)

## 2019-09-03 LAB — CBC
HCT: 30.7 % — ABNORMAL LOW (ref 39.0–52.0)
Hemoglobin: 9.4 g/dL — ABNORMAL LOW (ref 13.0–17.0)
MCH: 29.6 pg (ref 26.0–34.0)
MCHC: 30.6 g/dL (ref 30.0–36.0)
MCV: 96.5 fL (ref 80.0–100.0)
Platelets: 44 10*3/uL — ABNORMAL LOW (ref 150–400)
RBC: 3.18 MIL/uL — ABNORMAL LOW (ref 4.22–5.81)
RDW: 16.4 % — ABNORMAL HIGH (ref 11.5–15.5)
WBC: 3.4 10*3/uL — ABNORMAL LOW (ref 4.0–10.5)
nRBC: 0 % (ref 0.0–0.2)

## 2019-09-03 LAB — TROPONIN I (HIGH SENSITIVITY): Troponin I (High Sensitivity): 5 ng/L (ref <18)

## 2019-09-03 NOTE — ED Provider Notes (Signed)
[facility] EMERGENCY DEPARTMENT Provider Note  CSN: [id number] Arrival date & time: 09/03/19 5956    History Chief Complaint  Patient presents with  . Leg Swelling    HPI Obtained with spanish interpreter.  Mathew [name] is a 39 y.o. male patient with history of alcoholic liver disease who presents for evaluation of BLE leg swelling for several weeks. Not changed today but he just decided to come for evaluation. Swelling is from feet to knees bilaterally. Sometimes improved with elevating legs. He continues to drink alcohol. He has required paracentesis in the past but denies abdominal pain, CP or SOB today. No fever or cough.    Past Medical History:  Diagnosis Date  . Alcohol withdrawal seizure (HCC)   . ETOH abuse     Past Surgical History:  Procedure Laterality Date  . COLONOSCOPY WITH PROPOFOL N/A 03/17/2018   Procedure: COLONOSCOPY WITH PROPOFOL;  Surgeon: Bernette Redbird, MD;  Location: [facility] ENDOSCOPY;  Service: Endoscopy;  Laterality: N/A;  . ESOPHAGOGASTRODUODENOSCOPY (EGD) WITH PROPOFOL N/A 03/17/2018   Procedure: ESOPHAGOGASTRODUODENOSCOPY (EGD) WITH PROPOFOL;  Surgeon: Bernette Redbird, MD;  Location: [facility] ENDOSCOPY;  Service: Endoscopy;  Laterality: N/A;  . IR PARACENTESIS  03/14/2018    No family history on file.  Social History   Tobacco Use  . Smoking status: Current Every Day Smoker  . Smokeless tobacco: Never Used  Vaping Use  . Vaping Use: Never used  Substance Use Topics  . Alcohol use: Yes    Comment: daily   . Drug use: No     Home Medications Prior to Admission medications   Medication Sig Start Date End Date Taking? Authorizing Provider  folic acid (FOLVITE) 1 MG tablet Take 1 tablet (1 mg total) by mouth daily. 03/18/18   Ghimire, Werner Lean, MD  omeprazole (PRILOSEC) 20 MG capsule Take 1 capsule (20 mg total) by mouth daily. 03/17/18 03/17/19  Ghimire, Werner Lean, MD  thiamine 100 MG tablet Take 1 tablet (100 mg total) by mouth daily. 03/18/18   Ghimire,  Werner Lean, MD     Allergies    Patient has no known allergies.   Review of Systems   Review of Systems A comprehensive review of systems was completed and negative except as noted in HPI.    Physical Exam BP 114/73   Pulse 70   Temp 97.6 F (36.4 C) (Oral)   Resp 16   Ht 5\' 6"  (1.676 m)   Wt 81.6 kg   SpO2 100%   BMI 29.05 kg/m   Physical Exam Vitals and nursing note reviewed.  Constitutional:      Appearance: Normal appearance.  HENT:     Head: Normocephalic and atraumatic.     Nose: Nose normal.     Mouth/Throat:     Mouth: Mucous membranes are moist.  Eyes:     Extraocular Movements: Extraocular movements intact.     Conjunctiva/sclera: Conjunctivae normal.  Cardiovascular:     Rate and Rhythm: Normal rate.  Pulmonary:     Effort: Pulmonary effort is normal.     Breath sounds: Normal breath sounds.  Abdominal:     General: Abdomen is flat.     Palpations: Abdomen is soft.     Tenderness: There is no abdominal tenderness.  Musculoskeletal:        General: Normal range of motion.     Cervical back: Neck supple.     Right lower leg: Edema (2+ to the knee) present.     Left  lower leg: Edema (2+ to the knee) present.  Skin:    General: Skin is warm and dry.  Neurological:     General: No focal deficit present.     Mental Status: He is alert.  Psychiatric:        Mood and Affect: Mood normal.      ED Results / Procedures / Treatments   Labs (all labs ordered are listed, but only abnormal results are displayed) Labs Reviewed  BASIC METABOLIC PANEL - Abnormal; Notable for the following components:      Result Value   Potassium 3.2 (*)    Glucose, Bld 120 (*)    BUN <5 (*)    Calcium 7.7 (*)    All other components within normal limits  CBC - Abnormal; Notable for the following components:   WBC 3.4 (*)    RBC 3.18 (*)    Hemoglobin 9.4 (*)    HCT 30.7 (*)    RDW 16.4 (*)    Platelets 44 (*)    All other components within normal limits    TROPONIN I (HIGH SENSITIVITY)  TROPONIN I (HIGH SENSITIVITY)    EKG EKG Interpretation  Date/Time:  Tuesday September 03 2019 09:24:07 EDT Ventricular Rate:  87 PR Interval:  160 QRS Duration: 116 QT Interval:  424 QTC Calculation: 510 R Axis:   -30 Text Interpretation: Normal sinus rhythm Left axis deviation Prolonged QT Abnormal ECG No significant change since last tracing Confirmed by Susy Frizzle [id number]) on 09/03/2019 6:43:40 PM   Radiology DG Chest 2 View  Result Date: 09/03/2019 CLINICAL DATA:  Lower extremity edema. EXAM: CHEST - 2 VIEW COMPARISON:  10/11/2017 FINDINGS: Heart size upper normal.  Vascularity normal. Mild bibasilar atelectasis/infiltrate. Small band of airspace disease above the diaphragm on the lateral view is new. No effusion. IMPRESSION: Mild bibasilar airspace disease most likely atelectasis. Negative for pulmonary edema. Electronically Signed   By: Marlan Palau M.D.   On: 09/03/2019 09:48    Procedures Procedures  Medications Ordered in the ED Medications - No data to display   MDM Rules/Calculators/A&P MDM Patient with known liver disease here with chronic leg swelling. He states he has not been told that this is due to his liver disease in the past. He did not have LFTs done in triage today, but labs reviewed in the last month show albumin 2.7. I discussed with the patient that his kidney function is normal. No signs of CHF. Recommend compression stockings for symptom management, stop drinking alcohol, followup with PCP and GI.  ED Course  I have reviewed the triage vital signs and the nursing notes.  Pertinent labs & imaging results that were available during my care of the patient were reviewed by me and considered in my medical decision making (see chart for details).     Final Clinical Impression(s) / ED Diagnoses Final diagnoses:  Peripheral edema  Alcoholic liver disease (HCC)    Rx / DC Orders ED Discharge Orders         Ordered     Compression stockings        09/03/19 1849           Pollyann Savoy, MD 09/03/19 [phone]

## 2019-09-03 NOTE — ED Triage Notes (Addendum)
Patient complains of bilateral lower extremity swelling x 3 weeks, denies pain, denies SOB.  Patient alert and oriented. Reports some ETOH yesterday

## 2019-09-27 ENCOUNTER — Other Ambulatory Visit: Payer: Self-pay | Admitting: Gastroenterology

## 2019-09-27 DIAGNOSIS — R609 Edema, unspecified: Secondary | ICD-10-CM

## 2019-09-27 DIAGNOSIS — K7031 Alcoholic cirrhosis of liver with ascites: Secondary | ICD-10-CM

## 2019-10-14 ENCOUNTER — Ambulatory Visit: Payer: Self-pay | Attending: Family Medicine | Admitting: Family Medicine

## 2019-10-14 ENCOUNTER — Other Ambulatory Visit: Payer: Self-pay

## 2019-10-14 ENCOUNTER — Ambulatory Visit: Payer: Self-pay

## 2019-10-14 ENCOUNTER — Encounter: Payer: Self-pay | Admitting: Family Medicine

## 2019-10-14 ENCOUNTER — Other Ambulatory Visit: Payer: Self-pay | Admitting: Family Medicine

## 2019-10-14 VITALS — BP 149/80 | HR 103 | Temp 98.6°F | Ht 64.0 in | Wt 171.4 lb

## 2019-10-14 DIAGNOSIS — Z7689 Persons encountering health services in other specified circumstances: Secondary | ICD-10-CM

## 2019-10-14 DIAGNOSIS — E8809 Other disorders of plasma-protein metabolism, not elsewhere classified: Secondary | ICD-10-CM

## 2019-10-14 DIAGNOSIS — Z789 Other specified health status: Secondary | ICD-10-CM

## 2019-10-14 DIAGNOSIS — D509 Iron deficiency anemia, unspecified: Secondary | ICD-10-CM

## 2019-10-14 DIAGNOSIS — R7309 Other abnormal glucose: Secondary | ICD-10-CM

## 2019-10-14 DIAGNOSIS — R739 Hyperglycemia, unspecified: Secondary | ICD-10-CM

## 2019-10-14 DIAGNOSIS — K7031 Alcoholic cirrhosis of liver with ascites: Secondary | ICD-10-CM

## 2019-10-14 DIAGNOSIS — K7030 Alcoholic cirrhosis of liver without ascites: Secondary | ICD-10-CM

## 2019-10-14 DIAGNOSIS — Z758 Other problems related to medical facilities and other health care: Secondary | ICD-10-CM

## 2019-10-14 DIAGNOSIS — D696 Thrombocytopenia, unspecified: Secondary | ICD-10-CM

## 2019-10-14 DIAGNOSIS — Z603 Acculturation difficulty: Secondary | ICD-10-CM

## 2019-10-14 DIAGNOSIS — E876 Hypokalemia: Secondary | ICD-10-CM

## 2019-10-14 MED ORDER — CHLORDIAZEPOXIDE HCL 25 MG PO CAPS: 25.0000 mg | ORAL_CAPSULE | Freq: Three times a day (TID) | ORAL | 0 refills | Status: DC | PRN

## 2019-10-14 MED ORDER — FUROSEMIDE 40 MG PO TABS: 40.0000 mg | ORAL_TABLET | Freq: Every day | ORAL | 3 refills | Status: DC

## 2019-10-14 MED ORDER — FOLIC ACID 1 MG PO TABS: 1.0000 mg | ORAL_TABLET | Freq: Every day | ORAL | 3 refills | Status: DC

## 2019-10-14 MED ORDER — SPIRONOLACTONE 100 MG PO TABS: 100.0000 mg | ORAL_TABLET | Freq: Every day | ORAL | 5 refills | Status: DC

## 2019-10-14 NOTE — Progress Notes (Signed)
New Patient Office Visit  Subjective:  Patient ID: Mathew [name], male    DOB: [birth year]  Age: 39 y.o. MRN: [id number]  Due to language barrier, patient was offered video interpretation services which he declined and interpretation was done by a family friend at today's visit  CC:  Chief Complaint  Patient presents with  . Establish Care    HPI [place], 39 year old male, who presents to establish care.  Patient has history of alcoholic liver disease and is status post emergency department visit on 09/03/2019 due to ongoing issues with swelling in both legs.  He reports that the swelling in his legs has improved since he was prescribed medications to help remove fluid but he cannot recall the name of the medications nor the name of the doctor that prescribes her medications.  He reports that he has stopped drinking but does still occasionally feel anxious/jittery which he believes may be withdrawal type symptoms.  He also expresses issues with anxiety over his diagnosis as well as financial pressure.  His interpreter reports that her husband is friends with the patient and this is how she came to help with the patient's interpretation needs and medical visits.  She reports that the patient is currently living in his [place] due to financial issues.  Patient also started smoking within the past 12 months due to his anxiety over his diagnosis and financial stress.        He reports that he is not having any current issues with abdominal pain.  No current nausea, vomiting, diarrhea or constipation.  No blood in the stool or black stools.  He does not feel as if he is having any fluid buildup within the abdomen.  He has had no increased shortness of breath.  He denies any cough and no chest pain or palpitations.  He denies any unusual bruising or bleeding.  Past Medical History:  Diagnosis Date  . Alcohol withdrawal seizure (HCC)   . ETOH abuse     Past Surgical History:  Procedure Laterality Date    . COLONOSCOPY WITH PROPOFOL N/A 03/17/2018   Procedure: COLONOSCOPY WITH PROPOFOL;  Surgeon: Bernette Redbird, MD;  Location: [facility] ENDOSCOPY;  Service: Endoscopy;  Laterality: N/A;  . ESOPHAGOGASTRODUODENOSCOPY (EGD) WITH PROPOFOL N/A 03/17/2018   Procedure: ESOPHAGOGASTRODUODENOSCOPY (EGD) WITH PROPOFOL;  Surgeon: Bernette Redbird, MD;  Location: [facility] ENDOSCOPY;  Service: Endoscopy;  Laterality: N/A;  . IR PARACENTESIS  03/14/2018    Family History  Problem Relation Age of Onset  . Cancer Neg Hx   . Heart disease Neg Hx     Social History   Socioeconomic History  . Marital status: Significant Other    Spouse name: Not on file  . Number of children: Not on file  . Years of education: Not on file  . Highest education level: Not on file  Occupational History  . Not on file  Tobacco Use  . Smoking status: Current Every Day Smoker    Types: Cigarettes    Start date: 08/11/2019  . Smokeless tobacco: Never Used  Vaping Use  . Vaping Use: Never used  Substance and Sexual Activity  . Alcohol use: Not Currently    Comment: last beer 2x weeks ago  . Drug use: No  . Sexual activity: Not Currently  Other Topics Concern  . Not on file  Social History Narrative  . Not on file   Social Determinants of Health   Financial Resource Strain:   . Difficulty of Paying Living  Expenses: Not on file  Food Insecurity:   . Worried About Programme researcher, broadcasting/film/video in the Last Year: Not on file  . Ran Out of Food in the Last Year: Not on file  Transportation Needs:   . Lack of Transportation (Medical): Not on file  . Lack of Transportation (Non-Medical): Not on file  Physical Activity:   . Days of Exercise per Week: Not on file  . Minutes of Exercise per Session: Not on file  Stress:   . Feeling of Stress : Not on file  Social Connections:   . Frequency of Communication with Friends and Family: Not on file  . Frequency of Social Gatherings with Friends and Family: Not on file  . Attends Religious Services:  Not on file  . Active Member of Clubs or Organizations: Not on file  . Attends Banker Meetings: Not on file  . Marital Status: Not on file  Intimate Partner Violence:   . Fear of Current or Ex-Partner: Not on file  . Emotionally Abused: Not on file  . Physically Abused: Not on file  . Sexually Abused: Not on file    ROS Review of Systems  Constitutional: Positive for fatigue. Negative for chills and fever.  HENT: Negative for sore throat and trouble swallowing.   Eyes: Negative for photophobia and visual disturbance.  Respiratory: Negative for cough and shortness of breath.   Cardiovascular: Positive for leg swelling. Negative for chest pain and palpitations.  Gastrointestinal: Negative for abdominal pain, blood in stool, constipation, diarrhea and nausea.  Endocrine: Negative for polydipsia, polyphagia and polyuria.  Genitourinary: Negative for dysuria and frequency.  Musculoskeletal: Positive for gait problem (due to leg swelling). Negative for arthralgias.  Skin: Negative for rash and wound.  Neurological: Negative for dizziness and headaches.  Hematological: Negative for adenopathy. Does not bruise/bleed easily.  Psychiatric/Behavioral: Negative for suicidal ideas. The patient is nervous/anxious (concerned about his health).     Objective:   Today's Vitals: BP (!) 149/80 (BP Location: Left Arm, Patient Position: Sitting)   Pulse (!) 103   Temp 98.6 F (37 C)   Ht 5\' 4"  (1.626 m)   Wt 171 lb 6.4 oz (77.7 kg)   SpO2 98%   BMI 29.42 kg/m   Physical Exam Vitals and nursing note reviewed.  Constitutional:      Appearance: Normal appearance.  Eyes:     General: No scleral icterus.    Extraocular Movements: Extraocular movements intact.     Conjunctiva/sclera: Conjunctivae normal.  Neck:     Comments: No JVD Cardiovascular:     Rate and Rhythm: Normal rate and regular rhythm.  Pulmonary:     Effort: Pulmonary effort is normal.     Breath sounds: Normal  breath sounds.  Abdominal:     Palpations: Abdomen is soft.     Tenderness: There is no abdominal tenderness. There is no right CVA tenderness, left CVA tenderness, guarding or rebound.  Musculoskeletal:        General: No tenderness.     Cervical back: Neck supple.     Right lower leg: Edema present.     Left lower leg: Edema present.  Skin:    General: Skin is warm and dry.  Neurological:     General: No focal deficit present.     Mental Status: He is alert and oriented to person, place, and time.  Psychiatric:        Mood and Affect: Mood normal.  Behavior: Behavior normal.     Assessment & Plan:  1. Alcoholic cirrhosis of liver without ascites (HCC); encounter to establish care  patient does not appear to have any ascites at today's visit.  On review of chart, prior to today's visit, patient was noted to be scheduled for abdominal ultrasound today.  Patient was aware that he had an ultrasound but not what time this was scheduled and patient's interpreter was not aware of the ultrasound.  Information regarding the time for the ultrasound and location were given to patient/interpreter so that they could leave today's visit to have ultrasound done which was ordered by gastroenterology and patient will return after the ultrasound to have labs ordered at today's visit.  Unfortunately, patient's gastroenterology group is not on the same computer system as this office and patient is gastroenterology notes were not available for review.  Patient's pharmacy will be contacted in order to find out which medications were recently prescribed for the patient as patient reports that he is now on fluid pills that have been helping with his lower extremity swelling.  He also reports that he is stopped the use of all alcohol and feels that if he has occasional withdrawal type symptoms and prescription will be sent to his pharmacy for Librium to help with the symptoms. - Comprehensive metabolic  panel - chlordiazePOXIDE (LIBRIUM) 25 MG capsule; Take 1 capsule (25 mg total) by mouth 3 (three) times daily as needed for anxiety. Withdrawal symptoms  Dispense: 30 capsule; Refill: 0 - spironolactone (ALDACTONE) 100 MG tablet; Take 1 tablet (100 mg total) by mouth daily.  Dispense: 30 tablet; Refill: 5 - furosemide (LASIX) 40 MG tablet; Take 1 tablet (40 mg total) by mouth daily.  Dispense: 30 tablet; Refill: 3 - folic acid (FOLVITE) 1 MG tablet; Take 1 tablet (1 mg total) by mouth daily.  Dispense: 30 tablet; Refill: 3 - Ambulatory referral to Social Work  2. Hypokalemia On review of chart, patient with hypokalemia during recent emergency department visit and electrolytes will be checked as part of comprehensive metabolic panel as he reports that he is also now on diuretic medication which can further lower the potassium level.  3. Edema due to hypoalbuminemia Patient with bilateral lower extremity edema which is likely related to hypoalbuminemia related to his alcoholic liver disease as well as issues with venous return due to hepatic congestion/liver cirrhosis.  His pharmacy will be contacted to find out his current medications and these will be sent to the [facility] pharmacy.  He will have repeat of electrolytes and liver enzymes-comprehensive metabolic panel at today's visit. - Comprehensive metabolic panel - furosemide (LASIX) 40 MG tablet; Take 1 tablet (40 mg total) by mouth daily.  Dispense: 30 tablet; Refill: 3  4. Microcytic anemia Patient with microcytic anemia on recent blood work and has history of GI bleed in 2020 requiring 3 units of packed red blood cells on review of chart.  He states that he was provided with medication for treatment of anemia but cannot recall the name of the medication.  He will have repeat CBC as anemia can also contribute to his current issues with peripheral edema and patient will have iron panel and will be notified if an iron supplement is needed but will also  check with patient's pharmacy to see if he has been prescribed an iron supplement. - CBC - Iron, TIBC and Ferritin Panel  5. Thrombocytopenia (HCC) We will check CBC in follow-up of low platelet count that is likely  related to his liver disease.  He denies any unusual bruising or bleeding. - CBC  6. Elevated random blood glucose level Patient with elevated random glucose levels on recent blood work and will have hemoglobin A1c to see if he may be prediabetic or diabetic.  Patient with alcoholic liver disease which is likely affecting his storage of glucose which is controlled by the liver. - Hemoglobin A1c  8. Need for follow-up by social worker Patient with issues with anxiety as well as financial stress and referral will be placed to medical social worker to contact patient regarding counseling and available resources. - Ambulatory referral to Social Work  9. Language barrier Patient declined interpreter services and had a friend perform interpreting.   Outpatient Encounter Medications as of 10/14/2019  Medication Sig  . folic acid (FOLVITE) 1 MG tablet Take 1 tablet (1 mg total) by mouth daily.  Marland Kitchen. thiamine 100 MG tablet Take 1 tablet (100 mg total) by mouth daily.  . [DISCONTINUED] folic acid (FOLVITE) 1 MG tablet Take 1 tablet (1 mg total) by mouth daily.  . chlordiazePOXIDE (LIBRIUM) 25 MG capsule Take 1 capsule (25 mg total) by mouth 3 (three) times daily as needed for anxiety. Withdrawal symptoms  . furosemide (LASIX) 40 MG tablet Take 1 tablet (40 mg total) by mouth daily.  Marland Kitchen. omeprazole (PRILOSEC) 20 MG capsule Take 1 capsule (20 mg total) by mouth daily.  Marland Kitchen. spironolactone (ALDACTONE) 100 MG tablet Take 1 tablet (100 mg total) by mouth daily.   No facility-administered encounter medications on file as of 10/14/2019.    Follow-up: Return in about 4 weeks (around 11/11/2019).   Cain Saupeammie Nichele Slawson, MD

## 2019-10-15 LAB — COMPREHENSIVE METABOLIC PANEL WITH GFR
ALT: 21 IU/L (ref 0–44)
AST: 50 IU/L — ABNORMAL HIGH (ref 0–40)
Albumin/Globulin Ratio: 0.9 — ABNORMAL LOW (ref 1.2–2.2)
Albumin: 3.6 g/dL — ABNORMAL LOW (ref 4.0–5.0)
Alkaline Phosphatase: 197 IU/L — ABNORMAL HIGH (ref 44–121)
BUN/Creatinine Ratio: 8 — ABNORMAL LOW (ref 9–20)
BUN: 5 mg/dL — ABNORMAL LOW (ref 6–24)
Bilirubin Total: 1.9 mg/dL — ABNORMAL HIGH (ref 0.0–1.2)
CO2: 21 mmol/L (ref 20–29)
Calcium: 8.5 mg/dL — ABNORMAL LOW (ref 8.7–10.2)
Chloride: 106 mmol/L (ref 96–106)
Creatinine, Ser: 0.61 mg/dL — ABNORMAL LOW (ref 0.76–1.27)
GFR calc Af Amer: 143 mL/min/1.73
GFR calc non Af Amer: 124 mL/min/1.73
Globulin, Total: 4.1 g/dL (ref 1.5–4.5)
Glucose: 126 mg/dL — ABNORMAL HIGH (ref 65–99)
Potassium: 3.3 mmol/L — ABNORMAL LOW (ref 3.5–5.2)
Sodium: 140 mmol/L (ref 134–144)
Total Protein: 7.7 g/dL (ref 6.0–8.5)

## 2019-10-15 LAB — CBC
Hematocrit: 31.6 % — ABNORMAL LOW (ref 37.5–51.0)
Hemoglobin: 10.7 g/dL — ABNORMAL LOW (ref 13.0–17.7)
MCH: 28.8 pg (ref 26.6–33.0)
MCHC: 33.9 g/dL (ref 31.5–35.7)
MCV: 85 fL (ref 79–97)
Platelets: 47 x10E3/uL — CL (ref 150–450)
RBC: 3.72 x10E6/uL — ABNORMAL LOW (ref 4.14–5.80)
RDW: 15.4 % (ref 11.6–15.4)
WBC: 2.8 x10E3/uL — ABNORMAL LOW (ref 3.4–10.8)

## 2019-10-15 LAB — HEMOGLOBIN A1C
Est. average glucose Bld gHb Est-mCnc: 88 mg/dL
Hgb A1c MFr Bld: 4.7 % — ABNORMAL LOW (ref 4.8–5.6)

## 2019-10-16 ENCOUNTER — Telehealth: Payer: Self-pay

## 2019-10-16 NOTE — Telephone Encounter (Signed)
Att to contact pt to see what other pharmacy he uses so chlordiazepoxide 25mg  cap can be filled the rx is not avail here at [facility] pharmacy

## 2019-10-17 MED ORDER — CHLORDIAZEPOXIDE HCL 25 MG PO CAPS: 25.0000 mg | ORAL_CAPSULE | Freq: Three times a day (TID) | ORAL | 1 refills | Status: DC | PRN

## 2019-10-21 ENCOUNTER — Ambulatory Visit: Payer: Self-pay | Attending: Family Medicine

## 2019-10-21 ENCOUNTER — Ambulatory Visit: Payer: Self-pay | Attending: Family Medicine | Admitting: Licensed Clinical Social Worker

## 2019-10-21 ENCOUNTER — Other Ambulatory Visit: Payer: Self-pay

## 2019-10-21 DIAGNOSIS — F439 Reaction to severe stress, unspecified: Secondary | ICD-10-CM

## 2019-11-05 NOTE — BH Specialist Note (Signed)
Call placed to patient utilizing [employer]. LCSW introduced self and explained role at [facility]. Pt was informed of [facility] referral placed by PCP to address psychosocial stressors and anxiety.   Pt shared that he is doing well. He met with Financial Counselor today to assist with medical coverage. Additional paperwork is needed and pt agreed to follow up with [facility]. Pt reports that he is not in need of any behavioral health or resource needs.   Pt was encouraged to contact LCSW, should anything change and support is needed. No additional concerns noted.

## 2019-11-07 ENCOUNTER — Telehealth: Payer: Self-pay | Admitting: Family Medicine

## 2019-11-07 NOTE — Telephone Encounter (Signed)
Pt was sent a letter from financial dept. Inform them, that the application they submitted was incomplete, since they were missing some documentation at the time of the appointment, Pt need to reschedule and resubmit all new papers and application for [employer] and [employer], P.S. old documents has been sent back by mail to the Pt and Pt. need to make a new appt. 

## 2019-11-18 ENCOUNTER — Ambulatory Visit: Payer: Self-pay | Attending: Family Medicine | Admitting: Family Medicine

## 2019-11-18 ENCOUNTER — Other Ambulatory Visit: Payer: Self-pay

## 2019-11-18 ENCOUNTER — Encounter: Payer: Self-pay | Admitting: Family Medicine

## 2019-11-18 VITALS — BP 122/77 | Temp 100.0°F | Wt 178.0 lb

## 2019-11-18 DIAGNOSIS — Z789 Other specified health status: Secondary | ICD-10-CM

## 2019-11-18 DIAGNOSIS — D509 Iron deficiency anemia, unspecified: Secondary | ICD-10-CM

## 2019-11-18 DIAGNOSIS — E876 Hypokalemia: Secondary | ICD-10-CM

## 2019-11-18 DIAGNOSIS — E8809 Other disorders of plasma-protein metabolism, not elsewhere classified: Secondary | ICD-10-CM

## 2019-11-18 DIAGNOSIS — K7030 Alcoholic cirrhosis of liver without ascites: Secondary | ICD-10-CM

## 2019-11-18 DIAGNOSIS — D696 Thrombocytopenia, unspecified: Secondary | ICD-10-CM

## 2019-11-18 DIAGNOSIS — Z758 Other problems related to medical facilities and other health care: Secondary | ICD-10-CM

## 2019-11-18 DIAGNOSIS — Z603 Acculturation difficulty: Secondary | ICD-10-CM

## 2019-11-18 NOTE — Progress Notes (Signed)
PT HAS BEEN FEELING BETTER

## 2019-11-18 NOTE — Patient Instructions (Signed)
For the next 3 days, take 1 Furosemide in the morning, and 1 tablet in afternoon

## 2019-11-18 NOTE — Progress Notes (Signed)
Established Patient Office Visit  Subjective:  Patient ID: Mathew [name], male    DOB: 13-Dec-1980  Age: 39 y.o. MRN: [id number]  Due to language barrier, video interpretation system used at today's visit  CC:  Chief Complaint  Patient presents with  . Follow-up    HPI [place], 39 year old male, who is seen in follow-up of office visit on 10/14/2019 at which time patient made appointment to establish care following 09/03/2019 ED visit due to lower extremity edema and history of alcoholic liver disease.  He reports no current alcohol use.  No withdrawal issues/symptoms as he feels that the medication, Librium, prescribed at his last visit helped.  He reports that he does feel slightly better than at his previous visit.  He continues to have issues with lower extremity swelling and states that he stopped taking his medications for about 4 days recently because he took over-the-counter pain medications and he was told that he could not take both medications.  He reports that he does not have compression stockings.  He has not had follow-up with gastroenterology since his last visit.  On review of systems, he denies any current shortness of breath, cough and no chest pain.  He denies any abdominal pain.  He denies any unusual bleeding but has noticed increased issues with bruising.  Past Medical History:  Diagnosis Date  . Alcohol withdrawal seizure (HCC)   . ETOH abuse     Past Surgical History:  Procedure Laterality Date  . COLONOSCOPY WITH PROPOFOL N/A 03/17/2018   Procedure: COLONOSCOPY WITH PROPOFOL;  Surgeon: Bernette Redbird, MD;  Location: [facility] ENDOSCOPY;  Service: Endoscopy;  Laterality: N/A;  . ESOPHAGOGASTRODUODENOSCOPY (EGD) WITH PROPOFOL N/A 03/17/2018   Procedure: ESOPHAGOGASTRODUODENOSCOPY (EGD) WITH PROPOFOL;  Surgeon: Bernette Redbird, MD;  Location: [facility] ENDOSCOPY;  Service: Endoscopy;  Laterality: N/A;  . IR PARACENTESIS  03/14/2018    Family History  Problem Relation Age of Onset    . Cancer Neg Hx   . Heart disease Neg Hx     Social History   Socioeconomic History  . Marital status: Significant Other    Spouse name: Not on file  . Number of children: Not on file  . Years of education: Not on file  . Highest education level: Not on file  Occupational History  . Not on file  Tobacco Use  . Smoking status: Current Every Day Smoker    Types: Cigarettes    Start date: 08/11/2019  . Smokeless tobacco: Never Used  Vaping Use  . Vaping Use: Never used  Substance and Sexual Activity  . Alcohol use: Not Currently    Comment: last beer 2x weeks ago  . Drug use: No  . Sexual activity: Not Currently  Other Topics Concern  . Not on file  Social History Narrative  . Not on file   Social Determinants of Health   Financial Resource Strain:   . Difficulty of Paying Living Expenses: Not on file  Food Insecurity:   . Worried About Programme researcher, broadcasting/film/video in the Last Year: Not on file  . Ran Out of Food in the Last Year: Not on file  Transportation Needs:   . Lack of Transportation (Medical): Not on file  . Lack of Transportation (Non-Medical): Not on file  Physical Activity:   . Days of Exercise per Week: Not on file  . Minutes of Exercise per Session: Not on file  Stress:   . Feeling of Stress : Not on file  Social  Connections:   . Frequency of Communication with Friends and Family: Not on file  . Frequency of Social Gatherings with Friends and Family: Not on file  . Attends Religious Services: Not on file  . Active Member of Clubs or Organizations: Not on file  . Attends Banker Meetings: Not on file  . Marital Status: Not on file  Intimate Partner Violence:   . Fear of Current or Ex-Partner: Not on file  . Emotionally Abused: Not on file  . Physically Abused: Not on file  . Sexually Abused: Not on file    Outpatient Medications Prior to Visit  Medication Sig Dispense Refill  . chlordiazePOXIDE (LIBRIUM) 25 MG capsule Take 1 capsule (25 mg  total) by mouth 3 (three) times daily as needed for anxiety. Withdrawal symptoms 30 capsule 1  . folic acid (FOLVITE) 1 MG tablet Take 1 tablet (1 mg total) by mouth daily. 30 tablet 3  . furosemide (LASIX) 40 MG tablet Take 1 tablet (40 mg total) by mouth daily. 30 tablet 3  . spironolactone (ALDACTONE) 100 MG tablet Take 1 tablet (100 mg total) by mouth daily. 30 tablet 5  . omeprazole (PRILOSEC) 20 MG capsule Take 1 capsule (20 mg total) by mouth daily. 30 capsule 0  . thiamine 100 MG tablet Take 1 tablet (100 mg total) by mouth daily. (Patient not taking: Reported on 11/18/2019) 30 tablet 0   No facility-administered medications prior to visit.    No Known Allergies  ROS Review of Systems  Constitutional: Positive for fatigue. Negative for chills and fever.  HENT: Negative for sore throat and trouble swallowing.   Eyes: Negative for photophobia and visual disturbance.  Respiratory: Negative for cough and shortness of breath.   Cardiovascular: Positive for leg swelling. Negative for chest pain and palpitations.  Gastrointestinal: Negative for abdominal pain, constipation, diarrhea and nausea.  Endocrine: Negative for polydipsia, polyphagia and polyuria.  Genitourinary: Negative for dysuria and frequency.  Musculoskeletal: Negative for arthralgias and back pain.  Skin: Negative for rash and wound.  Neurological: Negative for dizziness and headaches.  Hematological: Negative for adenopathy. Does not bruise/bleed easily.  Psychiatric/Behavioral: Negative for suicidal ideas. The patient is not nervous/anxious.       Objective:    Physical Exam Constitutional:      Appearance: Normal appearance.  Neck:     Comments: Presence of JVD Cardiovascular:     Rate and Rhythm: Normal rate and regular rhythm.  Pulmonary:     Effort: Pulmonary effort is normal.     Breath sounds: Normal breath sounds.  Abdominal:     Palpations: Abdomen is soft.     Tenderness: There is no abdominal  tenderness. There is no right CVA tenderness, left CVA tenderness, guarding or rebound.  Musculoskeletal:     Cervical back: Normal range of motion and neck supple. No tenderness.     Right lower leg: Edema present.     Left lower leg: Edema present.     Comments: Patient is wearing jeans and has longjohns underneath there for assessment of edema not accurate however he still appears to have 2+ edema that extends slightly above the knees  Lymphadenopathy:     Cervical: No cervical adenopathy.  Skin:    General: Skin is warm and dry.  Neurological:     General: No focal deficit present.     Mental Status: He is alert and oriented to person, place, and time.  Psychiatric:  Mood and Affect: Mood normal.        Behavior: Behavior normal.     BP 122/77 (BP Location: Left Arm, Patient Position: Sitting)   Temp 100 F (37.8 C)   Wt 178 lb (80.7 kg)   SpO2 97%   BMI 30.55 kg/m  Wt Readings from Last 3 Encounters:  11/18/19 178 lb (80.7 kg)  10/14/19 171 lb 6.4 oz (77.7 kg)  09/03/19 180 lb (81.6 kg)     Health Maintenance Due  Topic Date Due  . COVID-19 Vaccine (1) Never done  . INFLUENZA VACCINE  08/11/2019      No results found for: TSH Lab Results  Component Value Date   WBC 2.8 (L) 10/14/2019   HGB 10.7 (L) 10/14/2019   HCT 31.6 (L) 10/14/2019   MCV 85 10/14/2019   PLT 47 (LL) 10/14/2019   Lab Results  Component Value Date   NA 140 10/14/2019   K 3.3 (L) 10/14/2019   CO2 21 10/14/2019   GLUCOSE 126 (H) 10/14/2019   BUN 5 (L) 10/14/2019   CREATININE 0.61 (L) 10/14/2019   BILITOT 1.9 (H) 10/14/2019   ALKPHOS 197 (H) 10/14/2019   AST 50 (H) 10/14/2019   ALT 21 10/14/2019   PROT 7.7 10/14/2019   ALBUMIN 3.6 (L) 10/14/2019   CALCIUM 8.5 (L) 10/14/2019   ANIONGAP 8 09/03/2019   No results found for: CHOL No results found for: HDL No results found for: LDLCALC No results found for: TRIG No results found for: CHOLHDL Lab Results  Component Value Date    HGBA1C 4.7 (L) 10/14/2019      Assessment & Plan:  1. Alcoholic cirrhosis of liver without ascites (HCC) 2. Edema due to hypoalbuminemia and chronic liver disease; 3.  Hypokalemia; 4.  Microcytic anemia; 5.  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  He does report that he missed 4 days of diuretic medications within the past 2 weeks.  He has been asked to take his furosemide twice daily for the next 3 days then resume once daily dosing.  He has also been asked to keep his legs elevated at home.  Discussed with patient that if he is sitting on the couch, he should instead lie down and place a few pillows beneath his legs/feet as this will help with the swelling.  New prescription provided for compression stockings as well as information on local medical supply stores.  On review of chart, he was given prescription at his emergency department visit in August for compression hose as well.  He will have repeat comprehensive metabolic panel to check electrolytes and liver enzymes.  Referral was placed at his last visit for gastroenterology follow-up and referral is being placed again at today's visit as she states that he has not been contacted regarding this referral.  Additionally, referral has also been made for patient to follow-up with hematology due to thrombocytopenia related to his liver disease as patient also reports that he has noticed increased bruising especially on the upper extremities.  CBC will be repeated at today's visit. - Comprehensive metabolic panel - Ambulatory referral to Gastroenterology - Compression stockings - CBC - Ambulatory referral to Hematology  6.  Language barrier Video interpretation system used to help with language barrier at today's visit   Follow-up: Return in about 6 weeks (around 12/30/2019) for chronic issues; go to ED if lower leg swelling worsens.    Cain Saupe, MD

## 2019-11-19 ENCOUNTER — Encounter: Payer: Self-pay | Admitting: Hematology and Oncology

## 2019-11-19 ENCOUNTER — Telehealth: Payer: Self-pay | Admitting: Hematology and Oncology

## 2019-11-19 LAB — CBC
Hematocrit: 29.2 % — ABNORMAL LOW (ref 37.5–51.0)
Hemoglobin: 10 g/dL — ABNORMAL LOW (ref 13.0–17.7)
MCH: 28.7 pg (ref 26.6–33.0)
MCHC: 34.2 g/dL (ref 31.5–35.7)
MCV: 84 fL (ref 79–97)
Platelets: 49 x10E3/uL — CL (ref 150–450)
RBC: 3.49 x10E6/uL — ABNORMAL LOW (ref 4.14–5.80)
RDW: 14.6 % (ref 11.6–15.4)
WBC: 2.6 x10E3/uL — ABNORMAL LOW (ref 3.4–10.8)

## 2019-11-19 LAB — COMPREHENSIVE METABOLIC PANEL WITH GFR
ALT: 23 IU/L (ref 0–44)
AST: 45 IU/L — ABNORMAL HIGH (ref 0–40)
Albumin/Globulin Ratio: 0.9 — ABNORMAL LOW (ref 1.2–2.2)
Albumin: 3.3 g/dL — ABNORMAL LOW (ref 4.0–5.0)
Alkaline Phosphatase: 203 IU/L — ABNORMAL HIGH (ref 44–121)
BUN/Creatinine Ratio: 10 (ref 9–20)
BUN: 5 mg/dL — ABNORMAL LOW (ref 6–24)
Bilirubin Total: 2.2 mg/dL — ABNORMAL HIGH (ref 0.0–1.2)
CO2: 22 mmol/L (ref 20–29)
Calcium: 8.3 mg/dL — ABNORMAL LOW (ref 8.7–10.2)
Chloride: 108 mmol/L — ABNORMAL HIGH (ref 96–106)
Creatinine, Ser: 0.52 mg/dL — ABNORMAL LOW (ref 0.76–1.27)
GFR calc Af Amer: 153 mL/min/1.73
GFR calc non Af Amer: 132 mL/min/1.73
Globulin, Total: 3.8 g/dL (ref 1.5–4.5)
Glucose: 117 mg/dL — ABNORMAL HIGH (ref 65–99)
Potassium: 4 mmol/L (ref 3.5–5.2)
Sodium: 140 mmol/L (ref 134–144)
Total Protein: 7.1 g/dL (ref 6.0–8.5)

## 2019-11-19 NOTE — Telephone Encounter (Signed)
I received a new pt referral from Dr. Jillyn Hidden for thrombocytopenia. [name] has been cld and scheduled via interpretive services on 12/1 at 315pm to see dr. Pamelia Hoit. I offered an earlier appt date and time, but due to the pt's work schedule he was unable to. Letter mailed.

## 2019-12-11 NOTE — Assessment & Plan Note (Addendum)
   Ref. Range 08/02/2014 20:45 10/16/2015 08:27 03/10/2016 20:37 07/08/2016 22:42 07/09/2016 03:43 07/10/2016 03:32 09/06/2016 20:19 09/22/2016 00:50 10/21/2016 09:52 10/11/2017 16:37 03/13/2018 14:15 03/14/2018 03:58 03/14/2018 12:36 03/15/2018 03:55 03/16/2018 05:12 03/17/2018 03:10 08/09/2019 15:29 09/03/2019 09:36 10/14/2019 10:20 11/18/2019 10:17  Platelets Latest Ref Range: 150 - 450 x10E3/uL 152 95 (L) 114 (L) 62 (L) 57 (L) 69 (L) 47 (L) 159 74 (LL) 84 (L) 70 (L) 64 (L) 62 (L) 60 (L) 57 (L) 53 (L) 33 (L) 44 (L) 47 (LL) 49 (LL)   Cause:  ETOH abuse   Thrombocytopenia (HCC)   Acute blood loss anemia   Alcoholic cirrhosis of liver with ascites (HCC)

## 2019-12-11 NOTE — Progress Notes (Incomplete)
[facility] CONSULT NOTE  Patient Care Team: Cain Saupe, MD as PCP - General (Family Medicine)  CHIEF COMPLAINTS/PURPOSE OF CONSULTATION:  Newly diagnosed thrombocytopenia  HISTORY OF PRESENTING ILLNESS:  Mathew [name] 39 y.o. male is here because of recent diagnosis of thrombocytopenia. She is referred by Dr. Jillyn Hidden. Labs on 11/18/19 showed platelets 49, 10/14/19 showed platelets 47. He presents to the clinic today for initial evaluation.   I reviewed his records extensively and collaborated the history with the patient.  MEDICAL HISTORY:  Past Medical History:  Diagnosis Date   Alcohol withdrawal seizure (HCC)    ETOH abuse     SURGICAL HISTORY: Past Surgical History:  Procedure Laterality Date   COLONOSCOPY WITH PROPOFOL N/A 03/17/2018   Procedure: COLONOSCOPY WITH PROPOFOL;  Surgeon: Bernette Redbird, MD;  Location: [facility] ENDOSCOPY;  Service: Endoscopy;  Laterality: N/A;   ESOPHAGOGASTRODUODENOSCOPY (EGD) WITH PROPOFOL N/A 03/17/2018   Procedure: ESOPHAGOGASTRODUODENOSCOPY (EGD) WITH PROPOFOL;  Surgeon: Bernette Redbird, MD;  Location: [facility] ENDOSCOPY;  Service: Endoscopy;  Laterality: N/A;   IR PARACENTESIS  03/14/2018    SOCIAL HISTORY: Social History   Socioeconomic History   Marital status: Significant Other    Spouse name: Not on file   Number of children: Not on file   Years of education: Not on file   Highest education level: Not on file  Occupational History   Not on file  Tobacco Use   Smoking status: Current Every Day Smoker    Types: Cigarettes    Start date: 08/11/2019   Smokeless tobacco: Never Used  Vaping Use   Vaping Use: Never used  Substance and Sexual Activity   Alcohol use: Not Currently    Comment: last beer 2x weeks ago   Drug use: No   Sexual activity: Not Currently  Other Topics Concern   Not on file  Social History Narrative   Not on file   Social Determinants of Health   Financial Resource Strain:    Difficulty  of Paying Living Expenses: Not on file  Food Insecurity:    Worried About Running Out of Food in the Last Year: Not on file   [employer] of Food in the Last Year: Not on file  Transportation Needs:    Lack of Transportation (Medical): Not on file   Lack of Transportation (Non-Medical): Not on file  Physical Activity:    Days of Exercise per Week: Not on file   Minutes of Exercise per Session: Not on file  Stress:    Feeling of Stress : Not on file  Social Connections:    Frequency of Communication with Friends and Family: Not on file   Frequency of Social Gatherings with Friends and Family: Not on file   Attends Religious Services: Not on file   Active Member of Clubs or Organizations: Not on file   Attends Banker Meetings: Not on file   Marital Status: Not on file  Intimate Partner Violence:    Fear of Current or Ex-Partner: Not on file   Emotionally Abused: Not on file   Physically Abused: Not on file   Sexually Abused: Not on file    FAMILY HISTORY: Family History  Problem Relation Age of Onset   Cancer Neg Hx    Heart disease Neg Hx     ALLERGIES:  has No Known Allergies.  MEDICATIONS:  Current Outpatient Medications  Medication Sig Dispense Refill   chlordiazePOXIDE (LIBRIUM) 25 MG capsule Take 1 capsule (25 mg total)  by mouth 3 (three) times daily as needed for anxiety. Withdrawal symptoms 30 capsule 1   folic acid (FOLVITE) 1 MG tablet Take 1 tablet (1 mg total) by mouth daily. 30 tablet 3   furosemide (LASIX) 40 MG tablet Take 1 tablet (40 mg total) by mouth daily. 30 tablet 3   omeprazole (PRILOSEC) 20 MG capsule Take 1 capsule (20 mg total) by mouth daily. 30 capsule 0   spironolactone (ALDACTONE) 100 MG tablet Take 1 tablet (100 mg total) by mouth daily. 30 tablet 5   thiamine 100 MG tablet Take 1 tablet (100 mg total) by mouth daily. (Patient not taking: Reported on 11/18/2019) 30 tablet 0   No current facility-administered  medications for this visit.    REVIEW OF SYSTEMS:   Constitutional: Denies fevers, chills or abnormal night sweats Eyes: Denies blurriness of vision, double vision or watery eyes Ears, nose, mouth, throat, and face: Denies mucositis or sore throat Respiratory: Denies cough, dyspnea or wheezes Cardiovascular: Denies palpitation, chest discomfort or lower extremity swelling Gastrointestinal:  Denies nausea, heartburn or change in bowel habits Skin: Denies abnormal skin rashes Lymphatics: Denies new lymphadenopathy or easy bruising Neurological:Denies numbness, tingling or new weaknesses Behavioral/Psych: Mood is stable, no new changes  All other systems were reviewed with the patient and are negative.  PHYSICAL EXAMINATION: ECOG PERFORMANCE STATUS: {CHL ONC ECOG PS:[id number]}  There were no vitals filed for this visit. There were no vitals filed for this visit.  GENERAL:alert, no distress and comfortable SKIN: skin color, texture, turgor are normal, no rashes or significant lesions EYES: normal, conjunctiva are pink and non-injected, sclera clear OROPHARYNX:no exudate, no erythema and lips, buccal mucosa, and tongue normal  NECK: supple, thyroid normal size, non-tender, without nodularity LYMPH:  no palpable lymphadenopathy in the cervical, axillary or inguinal LUNGS: clear to auscultation and percussion with normal breathing effort HEART: regular rate & rhythm and no murmurs and no lower extremity edema ABDOMEN:abdomen soft, non-tender and normal bowel sounds Musculoskeletal:no cyanosis of digits and no clubbing  PSYCH: alert & oriented x 3 with fluent speech NEURO: no focal motor/sensory deficits  LABORATORY DATA:  I have reviewed the data as listed Lab Results  Component Value Date   WBC 2.6 (L) 11/18/2019   HGB 10.0 (L) 11/18/2019   HCT 29.2 (L) 11/18/2019   MCV 84 11/18/2019   PLT 49 (LL) 11/18/2019   Lab Results  Component Value Date   NA 140 11/18/2019   K 4.0  11/18/2019   CL 108 (H) 11/18/2019   CO2 22 11/18/2019    RADIOGRAPHIC STUDIES: I have personally reviewed the radiological reports and agreed with the findings in the report.  ASSESSMENT AND PLAN:  No problem-specific Assessment & Plan notes found for this encounter.   All questions were answered. The patient knows to call the clinic with any problems, questions or concerns.   Sabas Sous, MD, MPH 12/11/2019    I, Molly Dorshimer, am acting as scribe for Serena Croissant, MD.  {Add scribe attestation statement}

## 2019-12-12 ENCOUNTER — Ambulatory Visit: Payer: Self-pay | Admitting: Hematology and Oncology

## 2019-12-12 DIAGNOSIS — D696 Thrombocytopenia, unspecified: Secondary | ICD-10-CM

## 2019-12-12 NOTE — Progress Notes (Deleted)
Patient ID: Mathew [name], male   DOB: [birth year], 39 y.o.   MRN: [id number]   Here today for recheck after seeing Dr Jillyn Hidden about 6 weeks ago.  From her last A/P: 1. Alcoholic cirrhosis of liver without ascites (HCC) 2. Edema due to hypoalbuminemia and chronic liver disease; 3.  Hypokalemia; 4.  Microcytic anemia; 5.  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  He does report that he missed 4 days of diuretic medications within the past 2 weeks.  He has been asked to take his furosemide twice daily for the next 3 days then resume once daily dosing.  He has also been asked to keep his legs elevated at home.  Discussed with patient that if he is sitting on the couch, he should instead lie down and place a few pillows beneath his legs/feet as this will help with the swelling.  New prescription provided for compression stockings as well as information on local medical supply stores.  On review of chart, he was given prescription at his emergency department visit in August for compression hose as well.  He will have repeat comprehensive metabolic panel to check electrolytes and liver enzymes.  Referral was placed at his last visit for gastroenterology follow-up and referral is being placed again at today's visit as she states that he has not been contacted regarding this referral.  Additionally, referral has also been made for patient to follow-up with hematology due to thrombocytopenia related to his liver disease as patient also reports that he has noticed increased bruising especially on the upper extremities.  CBC will be repeated at today's visit. - Comprehensive metabolic panel - Ambulatory referral to Gastroenterology - Compression stockings - CBC - Ambulatory referral to Hematology  6.  Language barrier Video interpretation system used to help with language barrier at today's visit

## 2019-12-16 ENCOUNTER — Ambulatory Visit: Payer: Self-pay | Admitting: Physician Assistant

## 2019-12-19 ENCOUNTER — Other Ambulatory Visit: Payer: Self-pay

## 2019-12-19 ENCOUNTER — Ambulatory Visit: Payer: Self-pay | Attending: Family Medicine | Admitting: Physician Assistant

## 2019-12-19 ENCOUNTER — Encounter: Payer: Self-pay | Admitting: Physician Assistant

## 2019-12-19 ENCOUNTER — Ambulatory Visit: Payer: Self-pay | Admitting: Family Medicine

## 2019-12-19 ENCOUNTER — Ambulatory Visit: Payer: Self-pay | Admitting: Family

## 2019-12-19 VITALS — BP 135/80 | HR 93 | Temp 98.6°F | Resp 18 | Ht 64.0 in | Wt 181.0 lb

## 2019-12-19 DIAGNOSIS — K7030 Alcoholic cirrhosis of liver without ascites: Secondary | ICD-10-CM

## 2019-12-19 DIAGNOSIS — D509 Iron deficiency anemia, unspecified: Secondary | ICD-10-CM

## 2019-12-19 DIAGNOSIS — D508 Other iron deficiency anemias: Secondary | ICD-10-CM

## 2019-12-19 DIAGNOSIS — D696 Thrombocytopenia, unspecified: Secondary | ICD-10-CM

## 2019-12-19 DIAGNOSIS — E559 Vitamin D deficiency, unspecified: Secondary | ICD-10-CM

## 2019-12-19 DIAGNOSIS — E876 Hypokalemia: Secondary | ICD-10-CM

## 2019-12-19 DIAGNOSIS — E8809 Other disorders of plasma-protein metabolism, not elsewhere classified: Secondary | ICD-10-CM

## 2019-12-19 NOTE — Progress Notes (Signed)
Patient has eaten today. Patient has not taken medication today. Patient denies pain at this time. Patient drinks and smokes due to anxiety which has increased the past 2 months.

## 2019-12-19 NOTE — Progress Notes (Signed)
Established Patient Office Visit  Subjective:  Patient ID: Mathew [name], male    DOB: March 18, 1980  Age: 39 y.o. MRN: [id number]  CC: No chief complaint on file.   HPI [employer] reports that he feeling better, no issues with any medications, states that the edema is mostly resolved, but will have some on occasion, states that he did start having some swelling in both lower legs a couple of days ago  States that continues to take the librium with reduction in cravngs of alcohol, bu does endorse that he will have a "small' drink approx every 3 days, states drink is one beer  States that he is taking the Librium once a day   States that he was unable to go to his hematology appointment due to his work schedule.  Due to language barrier, an interpreter was present during the history-taking and subsequent discussion (and for part of the physical exam) with this patient.   Past Medical History:  Diagnosis Date  . Alcohol withdrawal seizure (HCC)   . ETOH abuse     Past Surgical History:  Procedure Laterality Date  . COLONOSCOPY WITH PROPOFOL N/A 03/17/2018   Procedure: COLONOSCOPY WITH PROPOFOL;  Surgeon: Bernette Redbird, MD;  Location: [facility] ENDOSCOPY;  Service: Endoscopy;  Laterality: N/A;  . ESOPHAGOGASTRODUODENOSCOPY (EGD) WITH PROPOFOL N/A 03/17/2018   Procedure: ESOPHAGOGASTRODUODENOSCOPY (EGD) WITH PROPOFOL;  Surgeon: Bernette Redbird, MD;  Location: [facility] ENDOSCOPY;  Service: Endoscopy;  Laterality: N/A;  . IR PARACENTESIS  03/14/2018    Family History  Problem Relation Age of Onset  . Cancer Neg Hx   . Heart disease Neg Hx     Social History   Socioeconomic History  . Marital status: Significant Other    Spouse name: Not on file  . Number of children: Not on file  . Years of education: Not on file  . Highest education level: Not on file  Occupational History  . Not on file  Tobacco Use  . Smoking status: Current Every Day Smoker    Packs/day: 0.25    Types: Cigarettes     Start date: 08/11/2019  . Smokeless tobacco: Never Used  Vaping Use  . Vaping Use: Never used  Substance and Sexual Activity  . Alcohol use: Not Currently    Comment: last beer 2x weeks ago  . Drug use: No  . Sexual activity: Not Currently  Other Topics Concern  . Not on file  Social History Narrative  . Not on file   Social Determinants of Health   Financial Resource Strain: Not on file  Food Insecurity: Not on file  Transportation Needs: Not on file  Physical Activity: Not on file  Stress: Not on file  Social Connections: Not on file  Intimate Partner Violence: Not on file    Outpatient Medications Prior to Visit  Medication Sig Dispense Refill  . chlordiazePOXIDE (LIBRIUM) 25 MG capsule Take 1 capsule (25 mg total) by mouth 3 (three) times daily as needed for anxiety. Withdrawal symptoms 30 capsule 1  . folic acid (FOLVITE) 1 MG tablet Take 1 tablet (1 mg total) by mouth daily. 30 tablet 3  . furosemide (LASIX) 40 MG tablet Take 1 tablet (40 mg total) by mouth daily. 30 tablet 3  . spironolactone (ALDACTONE) 100 MG tablet Take 1 tablet (100 mg total) by mouth daily. 30 tablet 5  . omeprazole (PRILOSEC) 20 MG capsule Take 1 capsule (20 mg total) by mouth daily. 30 capsule 0  . thiamine 100  MG tablet Take 1 tablet (100 mg total) by mouth daily. (Patient not taking: No sig reported) 30 tablet 0   No facility-administered medications prior to visit.    No Known Allergies  ROS Review of Systems  Constitutional: Negative.   HENT: Negative.   Eyes: Negative.   Respiratory: Negative for shortness of breath.   Cardiovascular: Negative for chest pain and palpitations.  Gastrointestinal: Negative for abdominal distention.  Endocrine: Negative.   Genitourinary: Negative.   Musculoskeletal: Negative.   Skin: Negative.   Neurological: Negative for dizziness, weakness and headaches.  Hematological: Negative.   Psychiatric/Behavioral: Negative.       Objective:    Physical  Exam Vitals and nursing note reviewed.  Constitutional:      General: He is not in acute distress.    Appearance: Normal appearance. He is not ill-appearing.  HENT:     Head: Normocephalic and atraumatic.     Right Ear: External ear normal.     Left Ear: External ear normal.     Nose: Nose normal.     Mouth/Throat:     Mouth: Mucous membranes are moist.     Pharynx: Oropharynx is clear.  Eyes:     Extraocular Movements: Extraocular movements intact.     Conjunctiva/sclera: Conjunctivae normal.     Pupils: Pupils are equal, round, and reactive to light.  Cardiovascular:     Rate and Rhythm: Regular rhythm.     Pulses: Normal pulses.     Heart sounds: Murmur heard.    Abdominal:     General: Abdomen is flat. There is no distension.     Palpations: Abdomen is soft.  Musculoskeletal:     Cervical back: Normal range of motion and neck supple.     Right lower leg: 2+ Pitting Edema present.     Left lower leg: 2+ Pitting Edema present.  Skin:    General: Skin is warm and dry.  Neurological:     General: No focal deficit present.     Mental Status: He is alert and oriented to person, place, and time.  Psychiatric:        Mood and Affect: Mood normal.        Behavior: Behavior normal.        Thought Content: Thought content normal.        Judgment: Judgment normal.     BP 135/80 (BP Location: Left Arm, Patient Position: Sitting, Cuff Size: Normal)   Pulse 93   Temp 98.6 F (37 C) (Oral)   Resp 18   Ht 5\' 4"  (1.626 m)   Wt 181 lb (82.1 kg)   SpO2 100%   BMI 31.07 kg/m  Wt Readings from Last 3 Encounters:  12/19/19 181 lb (82.1 kg)  11/18/19 178 lb (80.7 kg)  10/14/19 171 lb 6.4 oz (77.7 kg)     Health Maintenance Due  Topic Date Due  . COVID-19 Vaccine (1) Never done  . INFLUENZA VACCINE  08/11/2019    There are no preventive care reminders to display for this patient.  No results found for: TSH Lab Results  Component Value Date   WBC 2.5 (LL) 12/19/2019    HGB 10.3 (L) 12/19/2019   HCT 30.6 (L) 12/19/2019   MCV 84 12/19/2019   PLT 39 (LL) 12/19/2019   Lab Results  Component Value Date   NA 139 12/19/2019   K 3.5 12/19/2019   CO2 22 11/18/2019   GLUCOSE 185 (H) 12/19/2019   BUN 3 (  L) 12/19/2019   CREATININE 0.52 (L) 12/19/2019   BILITOT 2.7 (H) 12/19/2019   ALKPHOS 172 (H) 12/19/2019   AST 50 (H) 12/19/2019   ALT 23 11/18/2019   PROT 6.8 12/19/2019   ALBUMIN 3.2 (L) 12/19/2019   CALCIUM 7.8 (L) 12/19/2019   ANIONGAP 8 09/03/2019   No results found for: CHOL No results found for: HDL No results found for: LDLCALC No results found for: TRIG No results found for: CHOLHDL Lab Results  Component Value Date   HGBA1C 4.7 (L) 10/14/2019      Assessment & Plan:   Problem List Items Addressed This Visit      Other   Thrombocytopenia (HCC) (Chronic)   Hypokalemia    Other Visit Diagnoses    Alcoholic cirrhosis of liver without ascites (HCC)    -  Primary   Relevant Orders   CBC with Differential/Platelet (Completed)   Comp. Metabolic Panel (12) (Completed)   Vitamin D, 25-hydroxy (Completed)   Edema due to hypoalbuminemia       Microcytic anemia       Relevant Orders   Iron, TIBC and Ferritin Panel (Completed)   Vitamin D, 25-hydroxy (Completed)    1. Alcoholic cirrhosis of liver without ascites (HCC) Strongly encourage patient to abstain from any alcohol, patient education given on adverse effects of alcohol on his liver.  Encourage patient to continue with financial assistance paperwork, unfortunately he is unable to afford referrals without the financial assistance.  Explained importance of his hematology and gastroenterology referrals.  Red flag warnings given for prompt evaluation in the emergency department  No refills needed today  - CBC with Differential/Platelet - Comp. Metabolic Panel (12) - Vitamin D, 25-hydroxy  2. Edema due to hypoalbuminemia   3. Hypokalemia   4. Microcytic anemia  - Iron,  TIBC and Ferritin Panel - Vitamin D, 25-hydroxy  5. Thrombocytopenia (HCC)   Edema - murmur  no refills needed today   No orders of the defined types were placed in this encounter.   I have reviewed the patient's medical history (PMH, PSH, Social History, Family History, Medications, and allergies) , and have been updated if relevant. I spent 30 minutes reviewing chart and  face to face time with patient.     Follow-up: Return in about 4 weeks (around 01/16/2020) for To establish PCP, At [facility].    Kasandra Knudsen Mayers, PA-C

## 2019-12-19 NOTE — Patient Instructions (Signed)
I encourage you to completely abstain from alcohol, if you are craving the taste, consider trying a nonalcoholic beer, however it is very important that you do abstain from alcohol  We will call you with your lab results  Please let us know if you need anything else before your next office visit  Roney Jaffe, PA-C Physician Assistant [facility] [email or web address]   Plan de alimentacin con bajo contenido de sodio Low-Sodium Eating Plan El sodio, que es uno de los elementos que constituyen la sal, ayuda a Pharmacologist un equilibrio saludable de los fluidos corporales. Mucha cantidad de sodio puede aumentar la presin arterial y Radio producer que el organismo retenga lquidos y residuos. El mdico o el nutricionista podran recomendarle que siga este plan si tiene presin arterial alta (hipertensin), enfermedad renal, enfermedad heptica o insuficiencia cardaca. El consumo de una menor cantidad de sodio puede ayudar a [employer] presin arterial, reducir la hinchazn y Physicist, medical, el hgado y los riones. Consejos para seguir [employer] plan [place] generales  La mayora de las personas que sigan este plan deben limitar la ingesta de sodio a entre 1500y2000mg (miligramos) por Futures trader. Lectura de las etiquetas de los alimentos   La etiqueta de informacin nutricional indica la cantidad de sodio en una porcin de alimento. Si come ms de una porcin, debe multiplicar la cantidad indicada de sodio por la cantidad de porciones.  Elija alimentos con menos de 140mg  de sodio por porcin.  Evite consumir alimentos que tengan 300mg  de sodio o ms por porcin. De compras  Busque productos con bajo contenido de sodio, en cuyas etiquetas suele indicarse "bajo contenido de sodio" o "sin agregado de sal".  Siempre controle el contenido de sodio, incluso si en la etiqueta de los alimentos dice "sin sal" o "sin agregado de sal".  Compre alimentos  frescos. ? Evite los alimentos enlatados y comidas precocidas o congeladas. ? Evite las carnes enlatadas, curadas o procesadas.  Compre panes que tengan menos de 80mg  de sodio por . Coccin  Consuma ms comida casera y menos de restaurante, de bufs y comida rpida.  Evite agregar sal cuando cocine. Use hierbas o aderezos sin sal, en lugar de sal de mesa o sal marina. Consulte al mdico o farmacutico antes de usar sustitutos de la sal.  Cocine con aceites de origen vegetal, como el de canola, girasol u Disputanta. Planificacin de las comidas  Cuando coma en un restaurante, pida que preparen su comida con menos sal o, en lo posible, sin nada de sal.  Evite consumir alimentos que contengan GMS (glutamato monosdico). El GMS suele agregarse a la comida , al consom y a algunos alimentos enlatados. Qu alimentos se recomiendan? Los alimentos enumerados a continuacin no constituyen [place]. Hable con el nutricionista sobre las mejores opciones alimenticias para usted. Cereales Cereales con bajo contenido de sodio, como [place], arroz y trigo inflados, y trigo triturado. Galletas con bajo contenido de [place]. Arroz sin sal. Pastas sin sal. Pan con bajo contenido de sodio. Panes y pastas integrales. Verduras Verduras frescas o congeladas. Verduras enlatadas "sin agregado de sal". Salsa de tomate y pastas "sin agregado de sal". Jugos de tomate y verduras con bajo contenido de sodio o reducidos en sodio. Water quality scientist Frutas frescas, congeladas o enlatadas. Jugo de frutas. Carnes y otros alimentos proteicos Carne de vaca o ave, pescado y mariscos frescos o congelados (sin agregado de sal). Atn y salmn enlatado con bajo contenido de [place]. Frutos secos sin sal. Lentejas, frijoles y  guisantes secos, sin agregado de sal. Frijoles enlatados sin sal. Huevos. Mantequillas de frutos secos sin sal. Lcteos Leche. Leche de soja. Quesos que, por naturaleza, tengan bajo contenido de sodio,  por ejemplo, la ricota, la mozzarella fresca o el queso suizo; o quesos con bajo contenido de sodio o reducidos en sodio. Queso crema. Yogur. Grasas y aceites Mantequilla sin sal. Margarina sin sal ni grasas trans. Aceites vegetales, como de canola u oliva. Condimentos y otros alimentos Hierbas y especias frescas y secas. Aderezos sin sal. Mostaza y ktchup con bajo contenido de [place]. Aderezos para ensalada sin sodio. Mayonesa light sin sodio. Rbano picante fresco o refrigerado. Jugo de limn. Vinagre. Sopas caseras o con contenido de sodio bajo o reducido. Palomitas de maz y pretzels sin sal. Papas fritas sin sal o con bajo contenido de sal. Qu alimentos no se recomiendan? Los alimentos enumerados a continuacin no constituyen Water quality scientist. Hable con el nutricionista sobre las mejores opciones alimenticias para usted. Cereales Cereales instantneos para comer caliente. Mezclas para bizcochos, panqueques y rellenos de pan. Crutones. Mezclas para pastas o arroz con condimento. Envases comerciales de sopa de fideos. Macarrones con queso envasados o congelados. Galletas saladas comunes. Harina leudante. Verduras Chucrut, verduras en escabeche y salsas. Aceitunas. Papas fritas. Anillos de cebollas. Verduras enlatadas regulares (que no sean con bajo contenido de sodio o reducidas en sodio). Pasta y salsa de tomates enlatadas regulares (que no sean con bajo contenido de sodio o reducidas en sodio). Jugos de tomate y verduras regulares (que no sean con bajo contenido de sodio o reducidos en sodio). Verduras Hydrologist. Carnes y otros alimentos proteicos Carne de vaca o pescado que est [place], [place], [place], condimentada o en escabeche. [place], jamn, salchichas, hotdogs, carne en conserva, carne ahumada, embutidos envasados, carne de cerdo salada, cecina, arenques en escabeche, anchoas, atn enlatado comn, sardinas, nueces saladas. Lcteos Quesos para untar y quesos procesados.  Requesn. Queso azul. Queso feta. Queso en hebras. Queso cottage comn. Suero de [place]. Leche enlatada. Grasas y aceites Mantequilla con sal. Margarina comn. Mantequilla clarificada. Grasa de panceta. Condimentos y otros alimentos Sal de cebolla, sal de ajo, sal condimentada, sal de mesa y sal marina. Salsas en lata y envasadas. Salsa Worcestershire. Salsa trtara. Salsa barbacoa. Salsa teriyaki. Salsa de soja, incluso la que tiene contenido reducido de [place]. Salsa de carne. Salsa de pescado. Salsa de [place]. Salsa rosada. Rbano picante envasado. [place] y [employer]. Saborizantes y tiernizantes para carne. Caldo en cubitos. Salsa picante y salsa tabasco. Escabeches envasados o ya preparados. Aderezos para tacos prefabricados o envasados. Salsas. Aderezos comunes para ensalada. Salsa. Nachos y papas fritas envasadas. Maz inflado y frituras de maz. Palomitas de maz y pretzels con sal. Sopas enlatadas o en polvo. Pizza. Pasteles y entradas congeladas. Resumen  El consumo de una menor cantidad de sodio puede ayudar a [employer] presin arterial, reducir la hinchazn y Physicist, medical, el hgado y los riones.  La [employer] de las personas que sigan este plan deben limitar la ingesta de sodio a entre 1500y2000mg (miligramos) por Futures trader.  Los [employer], envasados y [address] tienen alto contenido de [place]. La pizza, la comida rpida y la comida de los restaurantes tambin contienen mucho sodio. Tambin aporta sodio al agregar sal a las comidas.  Intente cocinar en su hogar, coma ms frutas y verduras frescas, y coma menos comidas rpidas y alimentos enlatados, procesados o preparados. Esta informacin no tiene Theme park manager el consejo del mdico. Asegrese de hacerle al  mdico cualquier pregunta que tenga. Document Revised: 05/05/2016 Document Reviewed: 05/05/2016 Elsevier Patient Education  2020 [employer].

## 2019-12-20 ENCOUNTER — Telehealth: Payer: Self-pay | Admitting: *Deleted

## 2019-12-20 DIAGNOSIS — E8809 Other disorders of plasma-protein metabolism, not elsewhere classified: Secondary | ICD-10-CM | POA: Insufficient documentation

## 2019-12-20 LAB — CBC WITH DIFFERENTIAL/PLATELET
Basophils Absolute: 0 10*3/uL (ref 0.0–0.2)
Basos: 1 %
EOS (ABSOLUTE): 0 10*3/uL (ref 0.0–0.4)
Eos: 2 %
Hematocrit: 30.6 % — ABNORMAL LOW (ref 37.5–51.0)
Hemoglobin: 10.3 g/dL — ABNORMAL LOW (ref 13.0–17.7)
Immature Grans (Abs): 0 10*3/uL (ref 0.0–0.1)
Immature Granulocytes: 0 %
Lymphocytes Absolute: 0.9 10*3/uL (ref 0.7–3.1)
Lymphs: 36 %
MCH: 28.1 pg (ref 26.6–33.0)
MCHC: 33.7 g/dL (ref 31.5–35.7)
MCV: 84 fL (ref 79–97)
Monocytes Absolute: 0.3 10*3/uL (ref 0.1–0.9)
Monocytes: 12 %
Neutrophils Absolute: 1.2 10*3/uL — ABNORMAL LOW (ref 1.4–7.0)
Neutrophils: 49 %
Platelets: 39 10*3/uL — CL (ref 150–450)
RBC: 3.66 x10E6/uL — ABNORMAL LOW (ref 4.14–5.80)
RDW: 14.2 % (ref 11.6–15.4)
WBC: 2.5 10*3/uL — CL (ref 3.4–10.8)

## 2019-12-20 LAB — IRON,TIBC AND FERRITIN PANEL
Ferritin: 24 ng/mL — ABNORMAL LOW (ref 30–400)
Iron Saturation: 7 % — CL (ref 15–55)
Iron: 15 ug/dL — ABNORMAL LOW (ref 38–169)
Total Iron Binding Capacity: 204 ug/dL — ABNORMAL LOW (ref 250–450)
UIBC: 189 ug/dL (ref 111–343)

## 2019-12-20 LAB — COMP. METABOLIC PANEL (12)
AST: 50 IU/L — ABNORMAL HIGH (ref 0–40)
Albumin/Globulin Ratio: 0.9 — ABNORMAL LOW (ref 1.2–2.2)
Albumin: 3.2 g/dL — ABNORMAL LOW (ref 4.0–5.0)
Alkaline Phosphatase: 172 IU/L — ABNORMAL HIGH (ref 44–121)
BUN/Creatinine Ratio: 6 — ABNORMAL LOW (ref 9–20)
BUN: 3 mg/dL — ABNORMAL LOW (ref 6–24)
Bilirubin Total: 2.7 mg/dL — ABNORMAL HIGH (ref 0.0–1.2)
Calcium: 7.8 mg/dL — ABNORMAL LOW (ref 8.7–10.2)
Chloride: 103 mmol/L (ref 96–106)
Creatinine, Ser: 0.52 mg/dL — ABNORMAL LOW (ref 0.76–1.27)
GFR calc Af Amer: 153 mL/min/{1.73_m2} (ref >59)
GFR calc non Af Amer: 132 mL/min/{1.73_m2} (ref >59)
Globulin, Total: 3.6 g/dL (ref 1.5–4.5)
Glucose: 185 mg/dL — ABNORMAL HIGH (ref 65–99)
Potassium: 3.5 mmol/L (ref 3.5–5.2)
Sodium: 139 mmol/L (ref 134–144)
Total Protein: 6.8 g/dL (ref 6.0–8.5)

## 2019-12-20 LAB — VITAMIN D 25 HYDROXY (VIT D DEFICIENCY, FRACTURES): Vit D, 25-Hydroxy: 5.5 ng/mL — ABNORMAL LOW (ref 30.0–100.0)

## 2019-12-20 MED ORDER — VITAMIN D (ERGOCALCIFEROL) 1.25 MG (50000 UNIT) PO CAPS: 50000.0000 [IU] | ORAL_CAPSULE | ORAL | 2 refills | Status: DC

## 2019-12-20 MED ORDER — FERROUS SULFATE 325 (65 FE) MG PO TABS: 325.0000 mg | ORAL_TABLET | Freq: Two times a day (BID) | ORAL | 3 refills | Status: DC

## 2019-12-20 NOTE — Telephone Encounter (Signed)
-----   Message from Roney Jaffe, [place] sent at 12/20/2019 12:02 PM EST ----- Please call patient and let him know that his iron is very low, he needs to start  with low dose of ferrous sulfate 325 mgm once daily, and if tolerated, increase dose to 2-3 tabs daily.  His platelet count continues to drop, it is very important that he schedule his appointment with hematology and gastroenterology  His vitamin D is very low, he needs to take 50,000 units once a week for the next 12 weeks  Prescriptions sent to his pharmacy

## 2019-12-20 NOTE — Addendum Note (Signed)
Addended by: Roney Jaffe on: 12/20/2019 12:02 PM   Modules accepted: Orders

## 2019-12-20 NOTE — Telephone Encounter (Signed)
Medical Assistant used [employer] to contact patient.  Interpreter Name: Janene Harvey #: [id number] Patient is aware of iron being low and needing to take 1 325 mg daily for 1 week, as long as tolerated patient will begin 2-3 tablets.  Patient is aware of vitamin d also being available.  Patient is advised of platelet count dropping and needing to schedule with the hematologist and gastroenterologist. Patient states he is eating and unable to write down the information. Patient advised to follow up as his responsibility.

## 2019-12-30 ENCOUNTER — Ambulatory Visit: Payer: Self-pay | Admitting: Family Medicine

## 2020-01-07 ENCOUNTER — Ambulatory Visit: Payer: Self-pay | Admitting: Physician Assistant

## 2020-01-31 NOTE — Progress Notes (Signed)
Subjective:    [name] ID: Mathew [name], male    DOB: 1980/05/15, 40 y.o.   MRN: [id number]  40 y.o.M hx of Alcoholic cirrhosis, Seizure d/o. ETOH use   02/03/20 is a former Dr. Jillyn [name] [name] here to establish primary care. Last visit was in December with provider Mayers but prior to that November with Dr. Jillyn [name]. Below is documentation from Dr. Debroah Baller visit 11/2019 Mathew [name], 40 year old male, who is seen in follow-up of office visit on 10/14/2019 at which time [name] made appointment to establish care following 09/03/2019 ED visit due to lower extremity edema and history of alcoholic liver disease.  He reports no current alcohol use.  No withdrawal issues/symptoms as he feels that the medication, Librium, prescribed at his last visit helped.  He reports that he does feel slightly better than at his previous visit.  He continues to have issues with lower extremity swelling and states that he stopped taking his medications for about 4 days recently because he took over-the-counter pain medications and he was told that he could not take both medications.  He reports that he does not have compression stockings.  He has not had follow-up with gastroenterology since his last visit.  On review of systems, he denies any current shortness of breath, cough and no chest pain.  He denies any abdominal pain.  He denies any unusual bleeding but has noticed increased issues with bruising. . Alcoholic cirrhosis of liver without ascites (HCC) 2. Edema due to hypoalbuminemia and chronic liver disease; 3.  Hypokalemia; 4.  Microcytic anemia; 5.  Thrombocytopenia [name] has continued bilateral lower extremity edema and also appears to have JVD.  He does report that he missed 4 days of diuretic medications within the past 2 weeks.  He has been asked to take his furosemide twice daily for the next 3 days then resume once daily dosing.  He has also been asked to keep his legs elevated at home.  Discussed with [name] that if  he is sitting on the couch, he should instead lie down and place a few pillows beneath his legs/feet as this will help with the swelling.  New prescription provided for compression stockings as well as information on local medical supply stores.  On review of chart, he was given prescription at his emergency department visit in August for compression hose as well.  He will have repeat comprehensive metabolic panel to check electrolytes and liver enzymes.  Referral was placed at his last visit for gastroenterology follow-up and referral is being placed again at today's visit as she states that he has not been contacted regarding this referral.  Additionally, referral has also been made for [name] to follow-up with hematology due to thrombocytopenia related to his liver disease as [name] also reports that he has noticed increased bruising especially on the upper extremities.  CBC will be repeated at today's visit. - Comprehensive metabolic panel - Ambulatory referral to Gastroenterology - Compression stockings - CBC - Ambulatory referral to Hematology  [name] never saw gastroenterology or hematology as he does not have any insurance access. [name] still trying to apply for financial assistance at this time.  [name] still drinking 1 beer daily. He has not sought any alcohol treatments. He does have a Librium prescription he takes as needed.  He is out of his vitamins thiamine and folic acid at this time.   Past Medical History:  Diagnosis Date  . Alcohol withdrawal seizure (HCC)   . ETOH abuse  Family History  Problem Relation Age of Onset  . Cancer Neg Hx   . Heart disease Neg Hx      Social History   Socioeconomic History  . Marital status: Significant Other    Spouse name: Not on file  . Number of children: Not on file  . Years of education: Not on file  . Highest education level: Not on file  Occupational History  . Not on file  Tobacco Use  . Smoking status: Current  Every Day Smoker    Packs/day: 0.25    Types: Cigarettes    Start date: 08/11/2019  . Smokeless tobacco: Never Used  Vaping Use  . Vaping Use: Never used  Substance and Sexual Activity  . Alcohol use: Not Currently    Comment: last beer 2x weeks ago  . Drug use: No  . Sexual activity: Not Currently  Other Topics Concern  . Not on file  Social History Narrative  . Not on file   Social Determinants of Health   Financial Resource Strain: Not on file  Food Insecurity: Not on file  Transportation Needs: Not on file  Physical Activity: Not on file  Stress: Not on file  Social Connections: Not on file  Intimate Partner Violence: Not on file     No Known Allergies   Outpatient Medications Prior to Visit  Medication Sig Dispense Refill  . chlordiazePOXIDE (LIBRIUM) 25 MG capsule Take 1 capsule (25 mg total) by mouth 3 (three) times daily as needed for anxiety. Withdrawal symptoms 30 capsule 1  . ferrous sulfate (FERROUSUL) 325 (65 FE) MG tablet Take 1 tablet (325 mg total) by mouth 2 (two) times daily with a meal. ([name] taking differently: Take 325 mg by mouth daily with breakfast.) 60 tablet 3  . furosemide (LASIX) 40 MG tablet Take 1 tablet (40 mg total) by mouth daily. 30 tablet 3  . spironolactone (ALDACTONE) 100 MG tablet Take 1 tablet (100 mg total) by mouth daily. 30 tablet 5  . Vitamin D, Ergocalciferol, (DRISDOL) 1.25 MG (50000 UNIT) CAPS capsule Take 1 capsule (50,000 Units total) by mouth every 7 (seven) days. 4 capsule 2  . folic acid (FOLVITE) 1 MG tablet Take 1 tablet (1 mg total) by mouth daily. ([name] not taking: Reported on 02/03/2020) 30 tablet 3  . omeprazole (PRILOSEC) 20 MG capsule Take 1 capsule (20 mg total) by mouth daily. 30 capsule 0  . thiamine 100 MG tablet Take 1 tablet (100 mg total) by mouth daily. ([name] not taking: Reported on 02/03/2020) 30 tablet 0   No facility-administered medications prior to visit.       Review of Systems  HENT:  Negative.   Respiratory: Negative.   Cardiovascular: Positive for leg swelling. Negative for chest pain.  Gastrointestinal: Positive for abdominal distention.  Musculoskeletal: Negative.   Skin: Negative.   Neurological: Negative.        Objective:   Physical Exam Vitals:   02/03/20 1512  BP: 129/66  Pulse: (!) 105  Temp: 98.3 F (36.8 C)  TempSrc: Oral  SpO2: 97%  Weight: 186 lb 12.8 oz (84.7 kg)  Height: 5\' 4"  (1.626 m)    Gen: Pleasant, well-nourished, in no distress,  normal affect  ENT: No lesions,  mouth clear,  oropharynx clear, no postnasal drip  Neck: No JVD, no TMG, no carotid bruits  Lungs: No use of accessory muscles, no dullness to percussion, clear without rales or rhonchi  Cardiovascular: RRR, heart sounds normal, no murmur  or gallops, 1+peripheral edema  Abdomen: soft and NT, no HSM,  BS normal, no ascites liver is not enlarged  Musculoskeletal: No deformities, no cyanosis or clubbing  Neuro: alert, non focal  Skin: Warm, no lesions or rashes        Assessment & Plan:  I personally reviewed all images and lab data in the [facility] system as well as any outside material available during this office visit and agree with the  radiology impressions.   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  Diagnoses and all orders for this visit:  Alcoholic cirrhosis of liver without ascites (HCC) -     furosemide (LASIX) 40 MG tablet; Take 1  tablet (40 mg total) by mouth daily as needed for edema. -     spironolactone (ALDACTONE) 100 MG tablet; Take 1 tablet (100 mg total) by mouth daily. -     folic acid (FOLVITE) 1 MG tablet; Take 1 tablet (1 mg total) by mouth daily. -     Vitamin B1 -     Comprehensive metabolic panel  Vitamin D deficiency -     Vitamin D, Ergocalciferol, (DRISDOL) 1.25 MG (50000 UNIT) CAPS capsule; Take 1 capsule (50,000 Units total) by mouth every 7 (seven) days.  Edema due to hypoalbuminemia -     furosemide (LASIX) 40 MG tablet; Take 1 tablet (40 mg total) by mouth daily as needed for edema.  Other iron deficiency anemia -     ferrous sulfate (FERROUSUL) 325 (65 FE) MG tablet; Take 1 tablet (325 mg total) by mouth 2 (two) times daily with a meal.  Microcytic anemia -     CBC with Differential/Platelet  Thrombocytopenia (HCC) -     CBC with Differential/Platelet  Need for immunization against influenza -     Flu Vaccine QUAD 36+ mos IM  ETOH abuse  Other orders -     Discontinue: thiamine 100 MG tablet; Take 1 tablet (100 mg total) by mouth daily. -     thiamine 100 MG tablet; Take 1 tablet (100 mg total) by mouth daily.  [name] interested in Covid vaccine [name] given information as to how to obtain  Flu vaccine given

## 2020-02-03 ENCOUNTER — Ambulatory Visit: Payer: Self-pay | Attending: Critical Care Medicine | Admitting: Critical Care Medicine

## 2020-02-03 ENCOUNTER — Other Ambulatory Visit: Payer: Self-pay | Admitting: Critical Care Medicine

## 2020-02-03 ENCOUNTER — Encounter: Payer: Self-pay | Admitting: Critical Care Medicine

## 2020-02-03 ENCOUNTER — Other Ambulatory Visit: Payer: Self-pay

## 2020-02-03 ENCOUNTER — Ambulatory Visit: Payer: Self-pay | Attending: Critical Care Medicine | Admitting: Licensed Clinical Social Worker

## 2020-02-03 VITALS — BP 129/66 | HR 105 | Temp 98.3°F | Ht 64.0 in | Wt 186.8 lb

## 2020-02-03 DIAGNOSIS — Z23 Encounter for immunization: Secondary | ICD-10-CM

## 2020-02-03 DIAGNOSIS — D696 Thrombocytopenia, unspecified: Secondary | ICD-10-CM

## 2020-02-03 DIAGNOSIS — D509 Iron deficiency anemia, unspecified: Secondary | ICD-10-CM

## 2020-02-03 DIAGNOSIS — E8809 Other disorders of plasma-protein metabolism, not elsewhere classified: Secondary | ICD-10-CM

## 2020-02-03 DIAGNOSIS — F1010 Alcohol abuse, uncomplicated: Secondary | ICD-10-CM

## 2020-02-03 DIAGNOSIS — D508 Other iron deficiency anemias: Secondary | ICD-10-CM

## 2020-02-03 DIAGNOSIS — K7030 Alcoholic cirrhosis of liver without ascites: Secondary | ICD-10-CM

## 2020-02-03 DIAGNOSIS — E559 Vitamin D deficiency, unspecified: Secondary | ICD-10-CM | POA: Insufficient documentation

## 2020-02-03 MED ORDER — SPIRONOLACTONE 100 MG PO TABS
100.0000 mg | ORAL_TABLET | Freq: Every day | ORAL | 5 refills | Status: DC
Start: 2020-02-03 — End: 2020-03-31

## 2020-02-03 MED ORDER — THIAMINE HCL 100 MG PO TABS: 100.0000 mg | ORAL_TABLET | Freq: Every day | ORAL | 1 refills | Status: DC

## 2020-02-03 MED ORDER — FUROSEMIDE 40 MG PO TABS
40.0000 mg | ORAL_TABLET | Freq: Every day | ORAL | 3 refills | Status: DC | PRN
Start: 2020-02-03 — End: 2020-03-31

## 2020-02-03 MED ORDER — VITAMIN D (ERGOCALCIFEROL) 1.25 MG (50000 UNIT) PO CAPS: 50000.0000 [IU] | ORAL_CAPSULE | ORAL | 2 refills | Status: DC

## 2020-02-03 MED ORDER — FERROUS SULFATE 325 (65 FE) MG PO TABS: 325.0000 mg | ORAL_TABLET | Freq: Two times a day (BID) | ORAL | 3 refills | Status: DC

## 2020-02-03 MED ORDER — FOLIC ACID 1 MG PO TABS
1.0000 mg | ORAL_TABLET | Freq: Every day | ORAL | 3 refills | Status: DC
Start: 2020-02-03 — End: 2020-02-03

## 2020-02-03 NOTE — Assessment & Plan Note (Signed)
Check CBC 

## 2020-02-03 NOTE — Progress Notes (Signed)
Pt agrees to flu vaccine

## 2020-02-03 NOTE — Assessment & Plan Note (Signed)
Clinical social worker is connected with the patient

## 2020-02-03 NOTE — Assessment & Plan Note (Signed)
Continue vitamin D weekly.  

## 2020-02-03 NOTE — Assessment & Plan Note (Signed)
Recheck metabolic panel.   

## 2020-02-03 NOTE — Assessment & Plan Note (Signed)
Alcohol cirrhosis without ascites No edema present  Patient connected with clinical social worker today for alcohol counseling  Refill Aldactone change Lasix to as needed refill thiamine folic acid recheck metabolic panel blood counts continue iron supplementation

## 2020-02-03 NOTE — Patient Instructions (Addendum)
Contine con Aldactone 1 diariamente y contine con cido flico tiamina diariamente  Aumente el hierro a 1 tableta dos veces al da con alimentos  Cambie furosemida a 1 diaria segn sea necesario para la hinchazn en las piernas.  Los laboratorios de [employer] niveles de tiamina en los recuentos sanguneos del panel metablico  Discutimos la necesidad de la vacuna Covid y la vacuna contra la gripe hoy.  Vea a continuacin dnde obtener la vacuna Covid: Puede encontrar informacin sobre la vacuna COVID-19 en: [email or web address] Para preguntas relacionadas con la distribucin de vacunas o citas, enve un correo electrnico a [email or web address] o llame al [phone].  Conctese con los servicios de tratamiento de alcohol como lo describe [place] social clnica Jasmine hoy  Volver a ver al [name] en 2 meses  We will schedule an appt two weeks with Jenel Lucks

## 2020-02-07 ENCOUNTER — Ambulatory Visit: Payer: Self-pay

## 2020-02-08 LAB — CBC WITH DIFFERENTIAL/PLATELET
Basophils Absolute: 0 10*3/uL (ref 0.0–0.2)
Basos: 1 %
EOS (ABSOLUTE): 0 10*3/uL (ref 0.0–0.4)
Eos: 1 %
Hematocrit: 26.4 % — ABNORMAL LOW (ref 37.5–51.0)
Hemoglobin: 9.7 g/dL — ABNORMAL LOW (ref 13.0–17.7)
Immature Grans (Abs): 0 10*3/uL (ref 0.0–0.1)
Immature Granulocytes: 1 %
Lymphocytes Absolute: 1.1 10*3/uL (ref 0.7–3.1)
Lymphs: 50 %
MCH: 29.6 pg (ref 26.6–33.0)
MCHC: 36.7 g/dL — ABNORMAL HIGH (ref 31.5–35.7)
MCV: 81 fL (ref 79–97)
Monocytes Absolute: 0.3 10*3/uL (ref 0.1–0.9)
Monocytes: 14 %
Neutrophils Absolute: 0.7 10*3/uL — ABNORMAL LOW (ref 1.4–7.0)
Neutrophils: 33 %
Platelets: 64 10*3/uL — CL (ref 150–450)
RBC: 3.28 x10E6/uL — ABNORMAL LOW (ref 4.14–5.80)
RDW: 14.9 % (ref 11.6–15.4)
WBC: 2.2 10*3/uL — CL (ref 3.4–10.8)

## 2020-02-08 LAB — COMPREHENSIVE METABOLIC PANEL
ALT: 31 IU/L (ref 0–44)
AST: 80 IU/L — ABNORMAL HIGH (ref 0–40)
Albumin/Globulin Ratio: 0.7 — ABNORMAL LOW (ref 1.2–2.2)
Albumin: 2.6 g/dL — ABNORMAL LOW (ref 4.0–5.0)
Alkaline Phosphatase: 171 IU/L — ABNORMAL HIGH (ref 44–121)
BUN/Creatinine Ratio: 6 — ABNORMAL LOW (ref 9–20)
BUN: 3 mg/dL — ABNORMAL LOW (ref 6–24)
Bilirubin Total: 1.9 mg/dL — ABNORMAL HIGH (ref 0.0–1.2)
CO2: 23 mmol/L (ref 20–29)
Calcium: 7.3 mg/dL — ABNORMAL LOW (ref 8.7–10.2)
Chloride: 107 mmol/L — ABNORMAL HIGH (ref 96–106)
Creatinine, Ser: 0.54 mg/dL — ABNORMAL LOW (ref 0.76–1.27)
GFR calc Af Amer: 151 mL/min/{1.73_m2} (ref >59)
GFR calc non Af Amer: 130 mL/min/{1.73_m2} (ref >59)
Globulin, Total: 3.5 g/dL (ref 1.5–4.5)
Glucose: 135 mg/dL — ABNORMAL HIGH (ref 65–99)
Potassium: 3.6 mmol/L (ref 3.5–5.2)
Sodium: 142 mmol/L (ref 134–144)
Total Protein: 6.1 g/dL (ref 6.0–8.5)

## 2020-02-08 LAB — VITAMIN B1: Thiamine: 83.5 nmol/L (ref 66.5–200.0)

## 2020-02-09 NOTE — BH Specialist Note (Signed)
LCSW introduced self and explained role at [facility] utilizing [place] [phone]. Pt was informed of referral from PCP regarding substance use.   Pt shared that he is interested in treatment for alcohol use. LCSW discussed various options. Pt is only interested in outpatient treatment, stating that he has to care for daughter and work 1-2 x weekly to maintain employment. LCSW provided patient with contact information for [facility], in addition, to Financial Counseling application to assist with medical coverage.

## 2020-02-17 ENCOUNTER — Institutional Professional Consult (permissible substitution): Payer: Self-pay | Admitting: Licensed Clinical Social Worker

## 2020-02-19 ENCOUNTER — Ambulatory Visit: Payer: Self-pay | Attending: Critical Care Medicine | Admitting: Licensed Clinical Social Worker

## 2020-02-19 ENCOUNTER — Other Ambulatory Visit: Payer: Self-pay

## 2020-02-19 DIAGNOSIS — F1010 Alcohol abuse, uncomplicated: Secondary | ICD-10-CM

## 2020-02-21 NOTE — BH Specialist Note (Signed)
LCSW met with patient to conduct a follow up visit utilizing [place]-id#[id number] Pt shared that he is ensuring to maintain appointments and take prescribed medications. Pt shared that he "feels better" and has decreased alcohol intake.  LCSW provided patient with contact information on [facility], per pt request to strengthening support system and success with decreasing alcohol use. Support and encouragement provided. No additional concerns noted.

## 2020-03-13 ENCOUNTER — Other Ambulatory Visit: Payer: Self-pay

## 2020-03-13 ENCOUNTER — Encounter (HOSPITAL_COMMUNITY): Payer: Self-pay

## 2020-03-13 ENCOUNTER — Ambulatory Visit (HOSPITAL_COMMUNITY)
Admission: EM | Admit: 2020-03-13 | Discharge: 2020-03-13 | Disposition: A | Discharge status: Home / Self Care | Payer: Self-pay | Attending: Emergency Medicine | Admitting: Emergency Medicine

## 2020-03-13 DIAGNOSIS — K429 Umbilical hernia without obstruction or gangrene: Secondary | ICD-10-CM

## 2020-03-13 NOTE — ED Triage Notes (Signed)
Pt reports having a lump in the belly button x 2 days. States "I think is because Im overweight". Denies abdominal pain.

## 2020-03-13 NOTE — Discharge Instructions (Signed)
Do not lift heavy objects for a week.   Go to the Emergency Department if the hernia gets larger, you develop any pain, nausea, vomiting, or diarrhea.

## 2020-03-13 NOTE — ED Provider Notes (Signed)
[facility]   [id number] 03/13/20 Arrival Time: 1209  CC: ABDOMINAL PAIN  SUBJECTIVE:  Mathew [name] is a 40 y.o. male who presents with possible hernia. Reports developing symptoms after lifting heavy objects 2 days ago.  Denies any trauma, close sick contact, recent travel or antibiotic use. Denies abdominal cramping. Has not taken OTC medications for this. Denies alleviating or aggravating factors. Denies similar symptoms in the past.   Denies fever, chills, appetite changes, weight changes, nausea, vomiting, chest pain, SOB, diarrhea, constipation, hematochezia, melena, dysuria, difficulty urinating, increased frequency or urgency, flank pain, loss of bowel or bladder function.      No LMP for male patient.  ROS: As per HPI.  All other pertinent ROS negative.     Past Medical History:  Diagnosis Date  . Alcohol withdrawal seizure (HCC)   . ETOH abuse    Past Surgical History:  Procedure Laterality Date  . COLONOSCOPY WITH PROPOFOL N/A 03/17/2018   Procedure: COLONOSCOPY WITH PROPOFOL;  Surgeon: Bernette Redbird, MD;  Location: [facility] ENDOSCOPY;  Service: Endoscopy;  Laterality: N/A;  . ESOPHAGOGASTRODUODENOSCOPY (EGD) WITH PROPOFOL N/A 03/17/2018   Procedure: ESOPHAGOGASTRODUODENOSCOPY (EGD) WITH PROPOFOL;  Surgeon: Bernette Redbird, MD;  Location: [facility] ENDOSCOPY;  Service: Endoscopy;  Laterality: N/A;  . IR PARACENTESIS  03/14/2018   No Known Allergies No current facility-administered medications on file prior to encounter.   Current Outpatient Medications on File Prior to Encounter  Medication Sig Dispense Refill  . chlordiazePOXIDE (LIBRIUM) 25 MG capsule Take 1 capsule (25 mg total) by mouth 3 (three) times daily as needed for anxiety. Withdrawal symptoms 30 capsule 1  . ferrous sulfate (FERROUSUL) 325 (65 FE) MG tablet Take 1 tablet (325 mg total) by mouth 2 (two) times daily with a meal. 60 tablet 3  . folic acid (FOLVITE) 1 MG tablet Take 1 tablet (1 mg total) by mouth daily.  60 tablet 3  . furosemide (LASIX) 40 MG tablet Take 1 tablet (40 mg total) by mouth daily as needed for edema. 40 tablet 3  . spironolactone (ALDACTONE) 100 MG tablet Take 1 tablet (100 mg total) by mouth daily. 60 tablet 5  . thiamine 100 MG tablet Take 1 tablet (100 mg total) by mouth daily. 60 tablet 1  . Vitamin D, Ergocalciferol, (DRISDOL) 1.25 MG (50000 UNIT) CAPS capsule Take 1 capsule (50,000 Units total) by mouth every 7 (seven) days. 12 capsule 2   Social History   Socioeconomic History  . Marital status: Significant Other    Spouse name: Not on file  . Number of children: Not on file  . Years of education: Not on file  . Highest education level: Not on file  Occupational History  . Not on file  Tobacco Use  . Smoking status: Current Every Day Smoker    Packs/day: 0.25    Types: Cigarettes    Start date: 08/11/2019  . Smokeless tobacco: Never Used  Vaping Use  . Vaping Use: Never used  Substance and Sexual Activity  . Alcohol use: Not Currently    Comment: last beer 2x weeks ago  . Drug use: No  . Sexual activity: Not Currently  Other Topics Concern  . Not on file  Social History Narrative  . Not on file   Social Determinants of Health   Financial Resource Strain: Not on file  Food Insecurity: Not on file  Transportation Needs: Not on file  Physical Activity: Not on file  Stress: Not on file  Social Connections: Not  on file  Intimate Partner Violence: Not on file   Family History  Problem Relation Age of Onset  . Cancer Neg Hx   . Heart disease Neg Hx      OBJECTIVE:  Vitals:   03/13/20 1306  BP: 130/75  Pulse: 90  Resp: 19  Temp: 97.9 F (36.6 C)  TempSrc: Oral  SpO2: 100%    General appearance: Alert; NAD HEENT: NCAT.  Oropharynx clear.  Lungs: clear to auscultation bilaterally without adventitious breath sounds Heart: regular rate and rhythm.  Radial pulses 2+ symmetrical bilaterally Abdomen: soft, non-distended; normal active bowel  sounds; non-tender to light and deep palpation; nontender at McBurney's point; negative Murphy's sign; negative rebound; no guarding; quarter-sized reducible, nontender umbilical hernia Back: no CVA tenderness Extremities: no edema; symmetrical with no gross deformities Skin: warm and dry Neurologic: normal gait Psychological: alert and cooperative; normal mood and affect  LABS: No results found for this or any previous visit (from the past 24 hour(s)).  DIAGNOSTIC STUDIES: No results found.   ASSESSMENT & PLAN:  1. Umbilical hernia without obstruction and without gangrene     No orders of the defined types were placed in this encounter.    Umbilical hernia.   Referral to general surgery.   Watch for increased pain, swelling, nausea, vomiting or diarrhea.   Reviewed expectations re: course of current medical issues. Questions answered. Outlined signs and symptoms indicating need for more acute intervention. Patient verbalized understanding. After Visit Summary given.   Ivette Loyal, NP 03/13/20 1344

## 2020-03-13 NOTE — ED Notes (Signed)
Pt called,no answer.

## 2020-03-23 ENCOUNTER — Ambulatory Visit: Payer: Self-pay | Attending: Critical Care Medicine

## 2020-03-23 ENCOUNTER — Other Ambulatory Visit: Payer: Self-pay

## 2020-03-30 ENCOUNTER — Ambulatory Visit: Payer: Self-pay | Attending: Critical Care Medicine

## 2020-03-30 ENCOUNTER — Other Ambulatory Visit: Payer: Self-pay

## 2020-03-30 ENCOUNTER — Ambulatory Visit: Payer: Self-pay | Admitting: Critical Care Medicine

## 2020-03-31 ENCOUNTER — Ambulatory Visit: Payer: Self-pay | Attending: Critical Care Medicine | Admitting: Family Medicine

## 2020-03-31 ENCOUNTER — Other Ambulatory Visit: Payer: Self-pay

## 2020-03-31 ENCOUNTER — Encounter: Payer: Self-pay | Admitting: Family Medicine

## 2020-03-31 ENCOUNTER — Other Ambulatory Visit: Payer: Self-pay | Admitting: Family Medicine

## 2020-03-31 DIAGNOSIS — K7031 Alcoholic cirrhosis of liver with ascites: Secondary | ICD-10-CM

## 2020-03-31 DIAGNOSIS — K429 Umbilical hernia without obstruction or gangrene: Secondary | ICD-10-CM

## 2020-03-31 MED ORDER — SPIRONOLACTONE 100 MG PO TABS
100.0000 mg | ORAL_TABLET | Freq: Every day | ORAL | 2 refills | Status: DC
Start: 2020-03-31 — End: 2020-03-31

## 2020-03-31 MED ORDER — FUROSEMIDE 40 MG PO TABS: 40.0000 mg | ORAL_TABLET | Freq: Every day | ORAL | 3 refills | Status: DC | PRN

## 2020-03-31 MED ORDER — THIAMINE HCL 100 MG PO TABS
100.0000 mg | ORAL_TABLET | Freq: Every day | ORAL | 2 refills | Status: DC
Start: 2020-03-31 — End: 2020-03-31

## 2020-03-31 MED ORDER — PANTOPRAZOLE SODIUM 40 MG PO TBEC: 40.0000 mg | DELAYED_RELEASE_TABLET | Freq: Every day | ORAL | 3 refills | Status: DC

## 2020-03-31 MED ORDER — NALTREXONE HCL 50 MG PO TABS: 50.0000 mg | ORAL_TABLET | Freq: Every day | ORAL | 1 refills | Status: DC

## 2020-03-31 NOTE — Patient Instructions (Signed)
Cirrhosis  Cirrhosis is long-term (chronic) liver injury. The liver is the body's largest internal organ, and it performs many functions. It converts food into energy, removes toxic material from the blood, makes important proteins, and absorbs necessary vitamins from food. In cirrhosis, healthy liver cells are replaced by scar tissue. This prevents blood from flowing through the liver and makes it difficult for the liver to complete its functions. What are the causes? Common causes of this condition are hepatitis C and long-term alcohol abuse. Other causes include:  Nonalcoholic fatty liver disease (NAFLD). This happens when fat is deposited in the liver by causes other than alcohol.  Hepatitis B infection.  Autoimmune hepatitis. In this condition, the body's defense system (immune system) mistakenly attacks the liver cells, causing inflammation.  Diseases that cause blockage of ducts inside the liver.  Inherited liver diseases, such as hemochromatosis. This is one of the most common inherited liver diseases. In this disease, deposits of iron collect in the liver and other organs.  Reactions to certain long-term medicines, such as amiodarone, a heart medicine.  Parasitic infections. These include schistosomiasis, which is caused by a flatworm.  Long-term contact to certain toxins. These toxins include certain organic solvents, such as toluene and chloroform. What increases the risk? You are more likely to develop this condition if:  You have certain types of viral hepatitis.  You abuse alcohol, especially if you are male.  You are overweight.  You use IV drugs and share needles.  You have unprotected sex with someone who has viral hepatitis. What are the signs or symptoms? You may not have any signs and symptoms at first. Symptoms may not develop until the damage to your liver starts to get worse. Early symptoms may include:  Weakness and tiredness (fatigue).  Changes in  sleep patterns or having trouble sleeping.  Itchiness.  Tenderness in the right-upper part of your abdomen.  Weight loss and muscle loss.  Nausea.  Loss of appetite. Later symptoms may include:  Fatigue or weakness that is getting worse.  Yellow skin and eyes (jaundice).  Buildup of fluid in the abdomen (ascites). You may notice that your clothes are tight around your waist.  Weight gain and swelling of the feet and ankles (edema).  Trouble breathing.  Easy bruising and bleeding.  Vomiting blood, or black or bloody stool.  Mental confusion. How is this diagnosed? Your health care provider may suspect cirrhosis based on your symptoms and medical history, especially if you have other medical conditions or a history of alcohol abuse. Your health care provider will do a physical exam to feel your liver and to check for signs of cirrhosis. Tests may include:  Blood tests to check: ? For hepatitis B or C. ? Kidney function. ? Liver function.  Imaging tests such as: ? MRI or CT scan to look for changes seen in advanced cirrhosis. ? Ultrasound to see if normal liver tissue is being replaced by scar tissue.  A procedure in which a long needle is used to take a sample of liver tissue to be checked in a lab (biopsy). Liver biopsy can confirm the diagnosis of cirrhosis. How is this treated? Treatment for this condition depends on how damaged your liver is and what caused the damage. It may include treating the symptoms of cirrhosis, or treating the underlying causes to slow the damage. Treatment may include:  Making lifestyle changes, such as: ? Eating a healthy diet. You may need to work with your health care   provider or a dietitian to develop an eating plan. ? Restricting salt intake. ? Maintaining a healthy weight. ? Not abusing drugs or alcohol.  Taking medicines to: ? Treat liver infections or other infections. ? Control itching. ? Reduce fluid buildup. ? Reduce certain  blood toxins. ? Reduce risk of bleeding from enlarged blood vessels in the stomach or esophagus (varices).  Liver transplant. In this procedure, a liver from a donor is used to replace your diseased liver. This is done if cirrhosis has caused liver failure. Other treatments and procedures may be done depending on the problems that you get from cirrhosis. Common problems include liver-related kidney failure (hepatorenal syndrome). Follow these instructions at home:  Take medicines only as told by your health care provider. Do not use medicines that are toxic to your liver. Ask your health care provider before taking any new medicines, including over-the-counter medicines such as NSAIDs.  Rest as needed.  Eat a well-balanced diet.  Limit your salt or water intake, if your health care provider asks you to do this.  Do not drink alcohol. This is especially important if you routinely take acetaminophen.  Keep all follow-up visits. This is important.   Contact a health care provider if you:  Have fatigue or weakness that is getting worse.  Develop swelling of the hands, feet, or legs, or a buildup of fluid in the abdomen (ascites).  Have a fever or chills.  Develop loss of appetite.  Have nausea or vomiting.  Develop jaundice.  Develop easy bruising or bleeding. Get help right away if you:  Vomit bright red blood or a material that looks like coffee grounds.  Have blood in your stools.  Notice that your stools appear black and tarry.  Become confused.  Have chest pain or trouble breathing. These symptoms may represent a serious problem that is an emergency. Do not wait to see if the symptoms will go away. Get medical help right away. Call your local emergency services (911 in the [place] Do not drive yourself to the hospital. Summary  Cirrhosis is chronic liver injury. Common causes are hepatitis C and long-term alcohol abuse.  Tests used to diagnose cirrhosis include blood  tests, imaging tests, and liver biopsy.  Treatment for this condition involves treating the underlying cause. Avoid alcohol, drugs, salt, and medicines that may damage your liver.  Get help right away if you vomit bright red blood or a material that looks like coffee grounds. This information is not intended to replace advice given to you by your health care provider. Make sure you discuss any questions you have with your health care provider. Document Revised: 10/10/2019 Document Reviewed: 10/10/2019 Elsevier Patient Education  2021 Elsevier Inc.  

## 2020-03-31 NOTE — Progress Notes (Signed)
Subjective:  Patient ID: Mathew Czaja [name] male    DOB: 05-Aug-1980  Age: 40 y.o. MRN: [id number]  CC: Hospitalization Follow-up   HPI Mathew [name] is a 40 year old male patient of Dr. Joya Gaskins with a history of alcoholic liver cirrhosis seen today for chronic disease follow-up.   Last alcoholic drink was yesterday  and prior to that was 2 weeks ago. He is aware his drinking is excessive. He drinks about three or four 40 oz /two or three days a week. He states he gets Depressed 'because of his condition' and would like to quit.  He does feel guilty about drinking and sometimes gets drunk but denies the need for an eye-opener.  He is concerned about an umbilical hernia which he states he noticed after some heavy lifting.  He has no pain at the site of his hernia and was seen at urgent care 3 weeks ago for same. His last abdominal ultrasound from 03/2018 revealed: IMPRESSION: 1. Thickened gallbladder wall without shadowing stone. Findings are nonspecific and could be secondary to acute or chronic cholecystitis, liver disease, or edema forming states. 2. Cirrhotic morphology of the liver with small moderate ascites   Past Medical History:  Diagnosis Date  . Alcohol withdrawal seizure ([place])   . ETOH abuse     Past Surgical History:  Procedure Laterality Date  . COLONOSCOPY WITH PROPOFOL N/A 03/17/2018   Procedure: COLONOSCOPY WITH PROPOFOL;  Surgeon: Ronald Lobo, MD;  Location: [place];  Service: Endoscopy;  Laterality: N/A;  . ESOPHAGOGASTRODUODENOSCOPY (EGD) WITH PROPOFOL N/A 03/17/2018   Procedure: ESOPHAGOGASTRODUODENOSCOPY (EGD) WITH PROPOFOL;  Surgeon: Ronald Lobo, MD;  Location: [place];  Service: Endoscopy;  Laterality: N/A;  . IR PARACENTESIS  03/14/2018    Family History  Problem Relation Age of Onset  . Cancer Neg Hx   . Heart disease Neg Hx     No Known Allergies  Outpatient Medications Prior to Visit  Medication Sig Dispense Refill  .  ferrous sulfate (FERROUSUL) 325 (65 FE) MG tablet Take 1 tablet (325 mg total) by mouth 2 (two) times daily with a meal. 60 tablet 3  . folic acid (FOLVITE) 1 MG tablet Take 1 tablet (1 mg total) by mouth daily. 60 tablet 3  . Vitamin D, Ergocalciferol, (DRISDOL) 1.25 MG (50000 UNIT) CAPS capsule Take 1 capsule (50,000 Units total) by mouth every 7 (seven) days. 12 capsule 2  . chlordiazePOXIDE (LIBRIUM) 25 MG capsule Take 1 capsule (25 mg total) by mouth 3 (three) times daily as needed for anxiety. Withdrawal symptoms 30 capsule 1  . furosemide (LASIX) 40 MG tablet Take 1 tablet (40 mg total) by mouth daily as needed for edema. 40 tablet 3  . spironolactone (ALDACTONE) 100 MG tablet Take 1 tablet (100 mg total) by mouth daily. 60 tablet 5  . thiamine 100 MG tablet Take 1 tablet (100 mg total) by mouth daily. 60 tablet 1   No facility-administered medications prior to visit.     ROS Review of Systems  Constitutional: Negative for activity change and appetite change.  HENT: Negative for sinus pressure and sore throat.   Eyes: Negative for visual disturbance.  Respiratory: Negative for cough, chest tightness and shortness of breath.   Cardiovascular: Negative for chest pain and leg swelling.  Gastrointestinal: Negative for abdominal distention, abdominal pain, constipation and diarrhea.  Endocrine: Negative.   Genitourinary: Negative for dysuria.  Musculoskeletal: Negative for joint swelling and myalgias.  Skin: Negative for rash.  Allergic/Immunologic:  Negative.   Neurological: Negative for weakness, light-headedness and numbness.  Psychiatric/Behavioral: Negative for dysphoric mood and suicidal ideas.    Objective:  BP (!) 144/78   Pulse 98   Resp 18   Ht 5\' 5"  (1.651 m)   Wt 174 lb 6.4 oz (79.1 kg)   SpO2 99%   BMI 29.02 kg/m   BP/Weight 03/31/2020 03/13/2020 02/03/2020  Systolic BP 144 130 129  Diastolic BP 78 75 66  Wt. (Lbs) 174.4 - 186.8  BMI 29.02 - 32.06       Physical Exam Constitutional:      Appearance: He is well-developed.  Neck:     Vascular: No JVD.  Cardiovascular:     Rate and Rhythm: Normal rate.     Heart sounds: Normal heart sounds. No murmur heard.   Pulmonary:     Effort: Pulmonary effort is normal.     Breath sounds: Normal breath sounds. No wheezing or rales.  Chest:     Chest wall: No tenderness.  Abdominal:     General: Bowel sounds are normal. There is no distension.     Palpations: Abdomen is soft. There is no mass.     Tenderness: There is no abdominal tenderness.     Hernia: A hernia (umbilical hernia) is present.  Musculoskeletal:        General: Normal range of motion.     Right lower leg: No edema.     Left lower leg: No edema.  Neurological:     Mental Status: He is alert and oriented to person, place, and time.  Psychiatric:        Mood and Affect: Mood normal.     CMP Latest Ref Rng & Units 02/03/2020 12/19/2019 11/18/2019  Glucose 65 - 99 mg/dL 13/08/2019) 165(C) 612(E)  BUN 6 - 24 mg/dL 3(L) 3(L) 5(L)  Creatinine 0.76 - 1.27 mg/dL 327(P) 5.62(R) 9.21(L)  Sodium 134 - 144 mmol/L 142 139 140  Potassium 3.5 - 5.2 mmol/L 3.6 3.5 4.0  Chloride 96 - 106 mmol/L 107(H) 103 108(H)  CO2 20 - 29 mmol/L 23 - 22  Calcium 8.7 - 10.2 mg/dL 7.3(L) 7.8(L) 8.3(L)  Total Protein 6.0 - 8.5 g/dL 6.1 6.8 7.1  Total Bilirubin 0.0 - 1.2 mg/dL 1.58(K) 2.7(H) 2.2(H)  Alkaline Phos 44 - 121 IU/L 171(H) 172(H) 203(H)  AST 0 - 40 IU/L 80(H) 50(H) 45(H)  ALT 0 - 44 IU/L 31 - 23    Lipid Panel  No results found for: CHOL, TRIG, HDL, CHOLHDL, VLDL, LDLCALC, LDLDIRECT  CBC    Component Value Date/Time   WBC 2.2 (LL) 02/03/2020 1554   WBC 3.4 (L) 09/03/2019 0936   RBC 3.28 (L) 02/03/2020 1554   RBC 3.18 (L) 09/03/2019 0936   HGB 9.7 (L) 02/03/2020 1554   HCT 26.4 (L) 02/03/2020 1554   PLT 64 (LL) 02/03/2020 1554   MCV 81 02/03/2020 1554   MCH 29.6 02/03/2020 1554   MCH 29.6 09/03/2019 0936   MCHC 36.7 (H)  02/03/2020 1554   MCHC 30.6 09/03/2019 0936   RDW 14.9 02/03/2020 1554   LYMPHSABS 1.1 02/03/2020 1554   MONOABS 0.3 10/16/2015 0827   EOSABS 0.0 02/03/2020 1554   BASOSABS 0.0 02/03/2020 1554    Lab Results  Component Value Date   HGBA1C 4.7 (L) 10/14/2019    Assessment & Plan:  1. Alcoholic cirrhosis of liver with ascites (HCC) Stable with no evidence of decompensation Strongly advised to quit alcohol I have placed him  on naltrexone and also initiated a PPI No evidence of portal hypertension in his imaging would consider beta-blocker at subsequent visit if blood pressure remains elevated He will benefit from alcohol Anonymous and has been referred to the social worker for resources. - furosemide (LASIX) 40 MG tablet; Take 1 tablet (40 mg total) by mouth daily as needed for edema.  Dispense: 40 tablet; Refill: 3 - spironolactone (ALDACTONE) 100 MG tablet; Take 1 tablet (100 mg total) by mouth daily.  Dispense: 60 tablet; Refill: 2 - thiamine 100 MG tablet; Take 1 tablet (100 mg total) by mouth daily.  Dispense: 60 tablet; Refill: 2 - CMP14+EGFR - CBC with Differential/Platelet - Protime-INR - naltrexone (DEPADE) 50 MG tablet; Take 1 tablet (50 mg total) by mouth daily.  Dispense: 30 tablet; Refill: 1 - pantoprazole (PROTONIX) 40 MG tablet; Take 1 tablet (40 mg total) by mouth daily.  Dispense: 30 tablet; Refill: 3 - Ambulatory referral to Social Work  2. Umbilical hernia without obstruction and without gangrene Discussed pathophysiology of this and at this time no evidence of obstruction or strangulation No intervention indicated at this time - furosemide (LASIX) 40 MG tablet; Take 1 tablet (40 mg total) by mouth daily as needed for edema.  Dispense: 40 tablet; Refill: 3    Meds ordered this encounter  Medications  . furosemide (LASIX) 40 MG tablet    Sig: Take 1 tablet (40 mg total) by mouth daily as needed for edema.    Dispense:  40 tablet    Refill:  3  .  spironolactone (ALDACTONE) 100 MG tablet    Sig: Take 1 tablet (100 mg total) by mouth daily.    Dispense:  60 tablet    Refill:  2  . thiamine 100 MG tablet    Sig: Take 1 tablet (100 mg total) by mouth daily.    Dispense:  60 tablet    Refill:  2  . naltrexone (DEPADE) 50 MG tablet    Sig: Take 1 tablet (50 mg total) by mouth daily.    Dispense:  30 tablet    Refill:  1  . pantoprazole (PROTONIX) 40 MG tablet    Sig: Take 1 tablet (40 mg total) by mouth daily.    Dispense:  30 tablet    Refill:  3    Follow-up: Return in about 3 months (around 07/01/2020) for follow up with PCP Dr Joya Gaskins.       Charlott Rakes, MD, FAAFP. [facility] [place], [place]   03/31/2020, 9:12 AM

## 2020-04-01 ENCOUNTER — Other Ambulatory Visit: Payer: Self-pay | Admitting: Family Medicine

## 2020-04-01 DIAGNOSIS — D696 Thrombocytopenia, unspecified: Secondary | ICD-10-CM

## 2020-04-01 LAB — CBC WITH DIFFERENTIAL/PLATELET
Basophils Absolute: 0 10*3/uL (ref 0.0–0.2)
Basos: 1 %
EOS (ABSOLUTE): 0.1 10*3/uL (ref 0.0–0.4)
Eos: 2 %
Hematocrit: 28.3 % — ABNORMAL LOW (ref 37.5–51.0)
Hemoglobin: 9 g/dL — ABNORMAL LOW (ref 13.0–17.7)
Immature Grans (Abs): 0 10*3/uL (ref 0.0–0.1)
Immature Granulocytes: 0 %
Lymphocytes Absolute: 1.1 10*3/uL (ref 0.7–3.1)
Lymphs: 33 %
MCH: 26.6 pg (ref 26.6–33.0)
MCHC: 31.8 g/dL (ref 31.5–35.7)
MCV: 84 fL (ref 79–97)
Monocytes Absolute: 0.4 10*3/uL (ref 0.1–0.9)
Monocytes: 11 %
Neutrophils Absolute: 1.8 10*3/uL (ref 1.4–7.0)
Neutrophils: 53 %
Platelets: 44 10*3/uL — CL (ref 150–450)
RBC: 3.38 x10E6/uL — ABNORMAL LOW (ref 4.14–5.80)
RDW: 15.6 % — ABNORMAL HIGH (ref 11.6–15.4)
WBC: 3.3 10*3/uL — ABNORMAL LOW (ref 3.4–10.8)

## 2020-04-01 LAB — CMP14+EGFR
ALT: 21 IU/L (ref 0–44)
AST: 62 IU/L — ABNORMAL HIGH (ref 0–40)
Albumin/Globulin Ratio: 0.6 — ABNORMAL LOW (ref 1.2–2.2)
Albumin: 2.7 g/dL — ABNORMAL LOW (ref 4.0–5.0)
Alkaline Phosphatase: 196 IU/L — ABNORMAL HIGH (ref 44–121)
BUN/Creatinine Ratio: 12 (ref 9–20)
BUN: 7 mg/dL (ref 6–24)
Bilirubin Total: 1.6 mg/dL — ABNORMAL HIGH (ref 0.0–1.2)
CO2: 21 mmol/L (ref 20–29)
Calcium: 7.8 mg/dL — ABNORMAL LOW (ref 8.7–10.2)
Chloride: 104 mmol/L (ref 96–106)
Creatinine, Ser: 0.57 mg/dL — ABNORMAL LOW (ref 0.76–1.27)
Globulin, Total: 4.8 g/dL — ABNORMAL HIGH (ref 1.5–4.5)
Glucose: 112 mg/dL — ABNORMAL HIGH (ref 65–99)
Potassium: 4 mmol/L (ref 3.5–5.2)
Sodium: 138 mmol/L (ref 134–144)
Total Protein: 7.5 g/dL (ref 6.0–8.5)
eGFR: 126 mL/min/{1.73_m2} (ref >59)

## 2020-04-01 LAB — PROTIME-INR
INR: 1.4 — ABNORMAL HIGH (ref 0.9–1.2)
Prothrombin Time: 14.2 s — ABNORMAL HIGH (ref 9.1–12.0)

## 2020-04-02 ENCOUNTER — Inpatient Hospital Stay (HOSPITAL_COMMUNITY)
Admission: EM | Admit: 2020-04-02 | Discharge: 2020-04-07 | DRG: 871 | Disposition: A | Discharge status: Home / Self Care | Payer: Self-pay | Attending: Internal Medicine | Admitting: Internal Medicine

## 2020-04-02 ENCOUNTER — Telehealth: Payer: Self-pay | Admitting: Licensed Clinical Social Worker

## 2020-04-02 ENCOUNTER — Emergency Department (HOSPITAL_COMMUNITY): Payer: Self-pay

## 2020-04-02 ENCOUNTER — Telehealth: Payer: Self-pay | Admitting: Hematology and Oncology

## 2020-04-02 ENCOUNTER — Telehealth: Payer: Self-pay | Admitting: Physician Assistant

## 2020-04-02 DIAGNOSIS — A419 Sepsis, unspecified organism: Principal | ICD-10-CM | POA: Diagnosis present

## 2020-04-02 DIAGNOSIS — F17210 Nicotine dependence, cigarettes, uncomplicated: Secondary | ICD-10-CM | POA: Diagnosis present

## 2020-04-02 DIAGNOSIS — D6959 Other secondary thrombocytopenia: Secondary | ICD-10-CM | POA: Diagnosis present

## 2020-04-02 DIAGNOSIS — K7290 Hepatic failure, unspecified without coma: Secondary | ICD-10-CM | POA: Diagnosis present

## 2020-04-02 DIAGNOSIS — F1010 Alcohol abuse, uncomplicated: Secondary | ICD-10-CM | POA: Diagnosis present

## 2020-04-02 DIAGNOSIS — J189 Pneumonia, unspecified organism: Secondary | ICD-10-CM | POA: Diagnosis present

## 2020-04-02 DIAGNOSIS — D696 Thrombocytopenia, unspecified: Secondary | ICD-10-CM | POA: Diagnosis present

## 2020-04-02 DIAGNOSIS — G928 Other toxic encephalopathy: Secondary | ICD-10-CM | POA: Diagnosis present

## 2020-04-02 DIAGNOSIS — K7031 Alcoholic cirrhosis of liver with ascites: Secondary | ICD-10-CM | POA: Diagnosis present

## 2020-04-02 DIAGNOSIS — R7989 Other specified abnormal findings of blood chemistry: Secondary | ICD-10-CM

## 2020-04-02 DIAGNOSIS — Z79899 Other long term (current) drug therapy: Secondary | ICD-10-CM

## 2020-04-02 DIAGNOSIS — K7030 Alcoholic cirrhosis of liver without ascites: Secondary | ICD-10-CM | POA: Diagnosis present

## 2020-04-02 DIAGNOSIS — Z20822 Contact with and (suspected) exposure to covid-19: Secondary | ICD-10-CM | POA: Diagnosis present

## 2020-04-02 DIAGNOSIS — R6520 Severe sepsis without septic shock: Secondary | ICD-10-CM | POA: Diagnosis present

## 2020-04-02 HISTORY — DX: Pneumonia, unspecified organism: J18.9

## 2020-04-02 LAB — COMPREHENSIVE METABOLIC PANEL
ALT: 26 U/L (ref 0–44)
AST: 111 U/L — ABNORMAL HIGH (ref 15–41)
Albumin: 2 g/dL — ABNORMAL LOW (ref 3.5–5.0)
Alkaline Phosphatase: 58 U/L (ref 38–126)
Anion gap: 8 (ref 5–15)
BUN: 18 mg/dL (ref 6–20)
CO2: 15 mmol/L — ABNORMAL LOW (ref 22–32)
Calcium: 7.3 mg/dL — ABNORMAL LOW (ref 8.9–10.3)
Chloride: 107 mmol/L (ref 98–111)
Creatinine, Ser: 0.63 mg/dL (ref 0.61–1.24)
GFR, Estimated: >60 mL/min (ref >60)
Glucose, Bld: 100 mg/dL — ABNORMAL HIGH (ref 70–99)
Potassium: 4.4 mmol/L (ref 3.5–5.1)
Sodium: 130 mmol/L — ABNORMAL LOW (ref 135–145)
Total Bilirubin: 4 mg/dL — ABNORMAL HIGH (ref 0.3–1.2)
Total Protein: 6.1 g/dL — ABNORMAL LOW (ref 6.5–8.1)

## 2020-04-02 LAB — CBC WITH DIFFERENTIAL/PLATELET
Abs Immature Granulocytes: 0.08 10*3/uL — ABNORMAL HIGH (ref 0.00–0.07)
Basophils Absolute: 0 10*3/uL (ref 0.0–0.1)
Basophils Relative: 0 %
Eosinophils Absolute: 0 10*3/uL (ref 0.0–0.5)
Eosinophils Relative: 0 %
HCT: 26.9 % — ABNORMAL LOW (ref 39.0–52.0)
Hemoglobin: 8.4 g/dL — ABNORMAL LOW (ref 13.0–17.0)
Immature Granulocytes: 1 %
Lymphocytes Relative: 2 %
Lymphs Abs: 0.2 10*3/uL — ABNORMAL LOW (ref 0.7–4.0)
MCH: 27.5 pg (ref 26.0–34.0)
MCHC: 31.2 g/dL (ref 30.0–36.0)
MCV: 87.9 fL (ref 80.0–100.0)
Monocytes Absolute: 0.5 10*3/uL (ref 0.1–1.0)
Monocytes Relative: 4 %
Neutro Abs: 10.5 10*3/uL — ABNORMAL HIGH (ref 1.7–7.7)
Neutrophils Relative %: 93 %
Platelets: 36 10*3/uL — ABNORMAL LOW (ref 150–400)
RBC: 3.06 MIL/uL — ABNORMAL LOW (ref 4.22–5.81)
RDW: 17 % — ABNORMAL HIGH (ref 11.5–15.5)
WBC: 11.3 10*3/uL — ABNORMAL HIGH (ref 4.0–10.5)
nRBC: 0 % (ref 0.0–0.2)

## 2020-04-02 LAB — AMMONIA: Ammonia: 57 umol/L — ABNORMAL HIGH (ref 9–35)

## 2020-04-02 LAB — APTT: aPTT: 46 seconds — ABNORMAL HIGH (ref 24–36)

## 2020-04-02 LAB — PROTIME-INR
INR: 2.9 — ABNORMAL HIGH (ref 0.8–1.2)
Prothrombin Time: 29.1 seconds — ABNORMAL HIGH (ref 11.4–15.2)

## 2020-04-02 LAB — LACTIC ACID, PLASMA
Lactic Acid, Venous: 5.1 mmol/L (ref 0.5–1.9)
Lactic Acid, Venous: 6.2 mmol/L (ref 0.5–1.9)

## 2020-04-02 LAB — RESP PANEL BY RT-PCR (FLU A&B, COVID) ARPGX2
Influenza A by PCR: NEGATIVE
Influenza B by PCR: NEGATIVE
SARS Coronavirus 2 by RT PCR: NEGATIVE

## 2020-04-02 LAB — ETHANOL: Alcohol, Ethyl (B): <10 mg/dL (ref <10)

## 2020-04-02 LAB — POC OCCULT BLOOD, ED: Fecal Occult Bld: NEGATIVE

## 2020-04-02 MED ORDER — FOLIC ACID 1 MG PO TABS
1.0000 mg | ORAL_TABLET | Freq: Every day | ORAL | Status: DC
  Administered 2020-04-03 – 2020-04-04 (×2): 1 mg via ORAL
  Filled 2020-04-02 (×2): qty 1

## 2020-04-02 MED ORDER — POLYETHYLENE GLYCOL 3350 17 G PO PACK: 17.0000 g | PACK | Freq: Every day | ORAL | Status: DC | PRN

## 2020-04-02 MED ORDER — LACTATED RINGERS IV SOLN: INTRAVENOUS | Status: AC

## 2020-04-02 MED ORDER — PANTOPRAZOLE SODIUM 40 MG PO TBEC
40.0000 mg | DELAYED_RELEASE_TABLET | Freq: Every day | ORAL | Status: DC
  Administered 2020-04-03 – 2020-04-07 (×5): 40 mg via ORAL
  Filled 2020-04-02 (×5): qty 1

## 2020-04-02 MED ORDER — ONDANSETRON HCL 4 MG PO TABS: 4.0000 mg | ORAL_TABLET | Freq: Four times a day (QID) | ORAL | Status: DC | PRN

## 2020-04-02 MED ORDER — LACTATED RINGERS IV BOLUS (SEPSIS)
1000.0000 mL | Freq: Once | INTRAVENOUS | Status: AC
  Administered 2020-04-02: 1000 mL via INTRAVENOUS

## 2020-04-02 MED ORDER — LACTATED RINGERS IV BOLUS (SEPSIS)
500.0000 mL | Freq: Once | INTRAVENOUS | Status: AC
  Administered 2020-04-02: 500 mL via INTRAVENOUS

## 2020-04-02 MED ORDER — SODIUM CHLORIDE 0.9 % IV SOLN
2.0000 g | Freq: Once | INTRAVENOUS | Status: AC
  Administered 2020-04-02: 2 g via INTRAVENOUS
  Filled 2020-04-02: qty 2

## 2020-04-02 MED ORDER — ACETAMINOPHEN 500 MG PO TABS
500.0000 mg | ORAL_TABLET | Freq: Once | ORAL | Status: AC
  Administered 2020-04-02: 500 mg via ORAL
  Filled 2020-04-02: qty 1

## 2020-04-02 MED ORDER — SODIUM CHLORIDE 0.9% FLUSH
3.0000 mL | Freq: Two times a day (BID) | INTRAVENOUS | Status: DC
  Administered 2020-04-03 – 2020-04-07 (×9): 3 mL via INTRAVENOUS

## 2020-04-02 MED ORDER — ACETAMINOPHEN 325 MG PO TABS
650.0000 mg | ORAL_TABLET | Freq: Four times a day (QID) | ORAL | Status: DC | PRN
  Administered 2020-04-03 – 2020-04-07 (×3): 650 mg via ORAL
  Filled 2020-04-02 (×5): qty 2

## 2020-04-02 MED ORDER — THIAMINE HCL 100 MG/ML IJ SOLN
100.0000 mg | Freq: Once | INTRAMUSCULAR | Status: AC
  Administered 2020-04-02: 100 mg via INTRAVENOUS
  Filled 2020-04-02: qty 2

## 2020-04-02 MED ORDER — LACTATED RINGERS IV SOLN: INTRAVENOUS | Status: DC

## 2020-04-02 MED ORDER — ONDANSETRON HCL 4 MG/2ML IJ SOLN: 4.0000 mg | Freq: Four times a day (QID) | INTRAMUSCULAR | Status: DC | PRN

## 2020-04-02 MED ORDER — ACETAMINOPHEN 650 MG RE SUPP: 650.0000 mg | Freq: Four times a day (QID) | RECTAL | Status: DC | PRN

## 2020-04-02 MED ORDER — METRONIDAZOLE IN NACL 5-0.79 MG/ML-% IV SOLN
500.0000 mg | Freq: Once | INTRAVENOUS | Status: AC
  Administered 2020-04-02: 500 mg via INTRAVENOUS
  Filled 2020-04-02: qty 100

## 2020-04-02 MED ORDER — THIAMINE HCL 100 MG PO TABS
100.0000 mg | ORAL_TABLET | Freq: Every day | ORAL | Status: DC
  Administered 2020-04-03 – 2020-04-04 (×2): 100 mg via ORAL
  Filled 2020-04-02 (×2): qty 1

## 2020-04-02 NOTE — Sepsis Progress Note (Signed)
Following for sepsis monitoring.   Lactic acid rising. Recommend a repeat.

## 2020-04-02 NOTE — ED Notes (Signed)
ED TO INPATIENT HANDOFF REPORT  Name/Age/Gender Mathew [name] 40 y.o. male  Code Status    Code Status Orders  (From admission, onward)         Start     Ordered   04/02/20 2328  Full code  Continuous        04/02/20 2331        Code Status History    Date Active Date Inactive Code Status Order ID Comments User Context   03/13/2018 2029 03/17/2018 1614 Full Code [id number]  Hillary Bow, DO ED   03/13/2018 1749 03/13/2018 2029 Full Code [id number]  Maxwell Caul, PA-C ED   07/09/2016 0219 07/10/2016 1643 Full Code [id number]  Michael Litter, MD ED   Advance Care Planning Activity      Home/SNF/Other Home  Chief Complaint Severe sepsis (HCC) [A41.9, R65.20]  Level of Care/Admitting Diagnosis ED Disposition    ED Disposition Condition Comment   Admit  Hospital Area: [facility] [[id number]]  Level of Care: Progressive [102]  Admit to Progressive based on following criteria: MULTISYSTEM THREATS such as stable sepsis, metabolic/electrolyte imbalance with or without encephalopathy that is responding to early treatment.  Admit to Progressive based on following criteria: GI, ENDOCRINE disease patients with GI bleeding, acute liver failure or pancreatitis, stable with diabetic ketoacidosis or thyrotoxicosis (hypothyroid) state.  May admit patient to [name] or [facility] if equivalent level of care is available:: No  Covid Evaluation: Confirmed COVID Negative  Diagnosis: Severe sepsis [facility]) [[id number]]  Admitting Physician: Synetta Fail [[id number]]  Attending Physician: Synetta Fail [phone]  Estimated length of stay: past midnight tomorrow  Certification:: I certify this patient will need inpatient services for at least 2 midnights       Medical History Past Medical History:  Diagnosis Date  . Alcohol withdrawal seizure (HCC)   . ETOH abuse     Allergies No Known Allergies  IV Location/Drains/Wounds Patient Lines/Drains/Airways  Status    Active Line/Drains/Airways    Name Placement date Placement time Site Days   Peripheral IV 04/02/20 Left Antecubital 04/02/20  2020  Antecubital  less than 1   Peripheral IV 04/02/20 Right Forearm 04/02/20  2138  Forearm  less than 1   External Urinary Catheter 04/02/20  2115  --  less than 1          Labs/Imaging Results for orders placed or performed during the hospital encounter of 04/02/20 (from the past 48 hour(s))  Lactic acid, plasma     Status: Abnormal   Collection Time: 04/02/20  8:26 PM  Result Value Ref Range   Lactic Acid, Venous 5.1 (HH) 0.5 - 1.9 mmol/L    Comment: CRITICAL RESULT CALLED TO, READ BACK BY AND VERIFIED WITH: BOBBY BROOKS @ 2204 ON 04/02/20 C VARNER Performed at [facility], [address], [place], [place] [address]   Comprehensive metabolic panel     Status: Abnormal   Collection Time: 04/02/20  8:26 PM  Result Value Ref Range   Sodium 130 (L) 135 - 145 mmol/L   Potassium 4.4 3.5 - 5.1 mmol/L   Chloride 107 98 - 111 mmol/L   CO2 15 (L) 22 - 32 mmol/L   Glucose, Bld 100 (H) 70 - 99 mg/dL    Comment: Glucose reference range applies only to samples taken after fasting for at least 8 hours.   BUN 18 6 - 20 mg/dL   Creatinine, Ser 2.97 0.61 - 1.24  mg/dL   Calcium 7.3 (L) 8.9 - 10.3 mg/dL   Total Protein 6.1 (L) 6.5 - 8.1 g/dL   Albumin 2.0 (L) 3.5 - 5.0 g/dL   AST 443 (H) 15 - 41 U/L   ALT 26 0 - 44 U/L   Alkaline Phosphatase 58 38 - 126 U/L   Total Bilirubin 4.0 (H) 0.3 - 1.2 mg/dL   GFR, Estimated >15 >40 mL/min    Comment: (NOTE) Calculated using the CKD-EPI Creatinine Equation (2021)    Anion gap 8 5 - 15    Comment: Performed at [facility], [address] [place], [place] [address]  CBC WITH DIFFERENTIAL     Status: Abnormal   Collection Time: 04/02/20  8:26 PM  Result Value Ref Range   WBC 11.3 (H) 4.0 - 10.5 K/uL   RBC 3.06 (L) 4.22 - 5.81 MIL/uL   Hemoglobin 8.4 (L) 13.0 - 17.0 g/dL    HCT 19.5 (L) 09.3 - 52.0 %   MCV 87.9 80.0 - 100.0 fL   MCH 27.5 26.0 - 34.0 pg   MCHC 31.2 30.0 - 36.0 g/dL   RDW 26.7 (H) 12.4 - 58.0 %   Platelets 36 (L) 150 - 400 K/uL    Comment: SPECIMEN CHECKED FOR CLOTS Immature Platelet Fraction may be clinically indicated, consider ordering this additional test [id number] REPEATED TO VERIFY    nRBC 0.0 0.0 - 0.2 %   Neutrophils Relative % 93 %   Neutro Abs 10.5 (H) 1.7 - 7.7 K/uL   Lymphocytes Relative 2 %   Lymphs Abs 0.2 (L) 0.7 - 4.0 K/uL   Monocytes Relative 4 %   Monocytes Absolute 0.5 0.1 - 1.0 K/uL   Eosinophils Relative 0 %   Eosinophils Absolute 0.0 0.0 - 0.5 K/uL   Basophils Relative 0 %   Basophils Absolute 0.0 0.0 - 0.1 K/uL   Immature Granulocytes 1 %   Abs Immature Granulocytes 0.08 (H) 0.00 - 0.07 K/uL   Polychromasia PRESENT     Comment: Performed at [facility], [address] [place], [place] [address]  Protime-INR     Status: Abnormal   Collection Time: 04/02/20  8:26 PM  Result Value Ref Range   Prothrombin Time 29.1 (H) 11.4 - 15.2 seconds   INR 2.9 (H) 0.8 - 1.2    Comment: (NOTE) INR goal varies based on device and disease states. Performed at [facility], [address], [place], [place] [address]   APTT     Status: Abnormal   Collection Time: 04/02/20  8:26 PM  Result Value Ref Range   aPTT 46 (H) 24 - 36 seconds    Comment:        IF BASELINE aPTT IS ELEVATED, SUGGEST PATIENT RISK ASSESSMENT BE USED TO DETERMINE APPROPRIATE ANTICOAGULANT THERAPY. Performed at [facility], [address] [place], [place] [address]   Ethanol     Status: None   Collection Time: 04/02/20  8:28 PM  Result Value Ref Range   Alcohol, Ethyl (B) <10 <10 mg/dL    Comment: (NOTE) Lowest detectable limit for serum alcohol is 10 mg/dL.  For medical purposes only. Performed at [facility], [address] [place], [place] [address]   POC  occult blood, ED     Status: None   Collection Time: 04/02/20  9:42 PM  Result Value Ref Range   Fecal Occult Bld NEGATIVE NEGATIVE  Resp Panel by RT-PCR (Flu  A&B, Covid) Nasopharyngeal Swab     Status: None   Collection Time: 04/02/20  9:45 PM   Specimen: Nasopharyngeal Swab; Nasopharyngeal(NP) swabs in vial transport medium  Result Value Ref Range   SARS Coronavirus 2 by RT PCR NEGATIVE NEGATIVE    Comment: (NOTE) SARS-CoV-2 target nucleic acids are NOT DETECTED.  The SARS-CoV-2 RNA is generally detectable in upper respiratory specimens during the acute phase of infection. The lowest concentration of SARS-CoV-2 viral copies this assay can detect is 138 copies/mL. A negative result does not preclude SARS-Cov-2 infection and should not be used as the sole basis for treatment or other patient management decisions. A negative result may occur with  improper specimen collection/handling, submission of specimen other than nasopharyngeal swab, presence of viral mutation(s) within the areas targeted by this assay, and inadequate number of viral copies(<138 copies/mL). A negative result must be combined with clinical observations, patient history, and epidemiological information. The expected result is Negative.  Fact Sheet for Patients:  BloggerCourse.comhttps://www.fda.gov/media/152166/download  Fact Sheet for Healthcare Providers:  SeriousBroker.ithttps://www.fda.gov/media/152162/download  This test is no t yet approved or cleared by the [place] FDA and  has been authorized for detection and/or diagnosis of SARS-CoV-2 by FDA under an Emergency Use Authorization (EUA). This EUA will remain  in effect (meaning this test can be used) for the duration of the COVID-19 declaration under Section 564(b)(1) of the Act, 21 U.S.C.section 360bbb-3(b)(1), unless the authorization is terminated  or revoked sooner.       Influenza A by PCR NEGATIVE NEGATIVE   Influenza B by PCR NEGATIVE NEGATIVE    Comment: (NOTE) The  Xpert Xpress SARS-CoV-2/FLU/RSV plus assay is intended as an aid in the diagnosis of influenza from Nasopharyngeal swab specimens and should not be used as a sole basis for treatment. Nasal washings and aspirates are unacceptable for Xpert Xpress SARS-CoV-2/FLU/RSV testing.  Fact Sheet for Patients: BloggerCourse.comhttps://www.fda.gov/media/152166/download  Fact Sheet for Healthcare Providers: SeriousBroker.ithttps://www.fda.gov/media/152162/download  This test is not yet approved or cleared by the [place] FDA and has been authorized for detection and/or diagnosis of SARS-CoV-2 by FDA under an Emergency Use Authorization (EUA). This EUA will remain in effect (meaning this test can be used) for the duration of the COVID-19 declaration under Section 564(b)(1) of the Act, 21 U.S.C. section 360bbb-3(b)(1), unless the authorization is terminated or revoked.  Performed at [facility], [address] [place], [place] [address]   Ammonia     Status: Abnormal   Collection Time: 04/02/20 10:00 PM  Result Value Ref Range   Ammonia 57 (H) 9 - 35 umol/L    Comment: Performed at [facility], [name] [place], [place] [address]  Lactic acid, plasma     Status: Abnormal   Collection Time: 04/02/20 10:26 PM  Result Value Ref Range   Lactic Acid, Venous 6.2 (HH) 0.5 - 1.9 mmol/L    Comment: CRITICAL VALUE NOTED.  VALUE IS CONSISTENT WITH PREVIOUSLY REPORTED AND CALLED VALUE. Performed at [facility], [address] [place], [place] [address]    DG Abdomen 1 View  Result Date: 04/02/2020 CLINICAL DATA:  Generalized weakness and lethargy for 2 days, vomiting EXAM: ABDOMEN - 1 VIEW COMPARISON:  None. FINDINGS: Two supine frontal views of the abdomen and pelvis are obtained. Moderate gaseous distention of the colon. No evidence of small-bowel obstruction or ileus. No masses or abnormal calcifications. No acute bony abnormalities. IMPRESSION: 1. No evidence  of bowel obstruction or ileus. Moderate  gas distention of the colon. Electronically Signed   By: Sharlet Salina M.D.   On: 04/02/2020 21:13   DG Chest Port 1 View  Result Date: 04/02/2020 CLINICAL DATA:  Weakness, lethargy for 2 days, emesis EXAM: PORTABLE CHEST 1 VIEW COMPARISON:  09/03/2019 FINDINGS: Single frontal view of the chest demonstrates an unremarkable cardiac silhouette. Increased density right upper hemithorax consistent with underlying airspace disease and/or loculated effusion. No effusion or pneumothorax. No acute bony abnormalities. IMPRESSION: Right upper lobe consolidation and/or loculated effusion. Findings are consistent with underlying pneumonia. Electronically Signed   By: Sharlet Salina M.D.   On: 04/02/2020 21:16    Pending Labs Unresulted Labs (From admission, onward)          Start     Ordered   04/03/20 0500  Protime-INR  Tomorrow morning,   R        04/02/20 2331   04/03/20 0500  Cortisol-am, blood  Tomorrow morning,   R        04/02/20 2331   04/03/20 0500  Procalcitonin  Tomorrow morning,   R        04/02/20 2331   04/03/20 0500  Comprehensive metabolic panel  Tomorrow morning,   R        04/02/20 2331   04/03/20 0500  CBC  Tomorrow morning,   R        04/02/20 2331   04/03/20 0030  CBC  Once,   STAT        04/02/20 2331   04/03/20 0030  Lactic acid, plasma  STAT Now then every 3 hours,   R (with STAT occurrences)      04/02/20 2343   04/02/20 2330  Magnesium  Add-on,   AD        04/02/20 2331   04/02/20 2330  Phosphorus  Add-on,   AD        04/02/20 2331   04/02/20 2326  HIV Antibody (routine testing w rflx)  (HIV Antibody (Routine testing w reflex) panel)  Once,   STAT        04/02/20 2331   04/02/20 2026  Blood Culture (routine x 2)  (Septic presentation on arrival (screening labs, nursing and treatment orders for obvious sepsis))  BLOOD CULTURE X 2,   STAT      04/02/20 2026   04/02/20 2026  Urinalysis, Routine w reflex microscopic  (Septic  presentation on arrival (screening labs, nursing and treatment orders for obvious sepsis))  ONCE - STAT,   STAT        04/02/20 2026   04/02/20 2026  Urine culture  (Septic presentation on arrival (screening labs, nursing and treatment orders for obvious sepsis))  ONCE - STAT,   STAT        04/02/20 2026   04/02/20 2026  Type and screen [facility]  Once,   STAT       Comments: [facility]    04/02/20 2026          Vitals/Pain Today's Vitals   04/02/20 2056 04/02/20 2200 04/02/20 2300 04/02/20 2330  BP: 111/63 123/82 120/68 118/76  Pulse: (!) 108 (!) 114 100 100  Resp: (!) 21 (!) 21 20 (!) 21  Temp: (!) 102.4 F (39.1 C)     TempSrc: Oral     SpO2: 98% 100% 96% 93%    Isolation Precautions No active isolations  Medications Medications  pantoprazole (PROTONIX) EC tablet 40 mg (has no administration  in time range)  thiamine tablet 100 mg (has no administration in time range)  folic acid (FOLVITE) tablet 1 mg (has no administration in time range)  sodium chloride flush (NS) 0.9 % injection 3 mL (has no administration in time range)  lactated ringers infusion ( Intravenous New Bag/Given 04/02/20 2342)  acetaminophen (TYLENOL) tablet 650 mg (has no administration in time range)    Or  acetaminophen (TYLENOL) suppository 650 mg (has no administration in time range)  polyethylene glycol (MIRALAX / GLYCOLAX) packet 17 g (has no administration in time range)  ondansetron (ZOFRAN) tablet 4 mg (has no administration in time range)    Or  ondansetron (ZOFRAN) injection 4 mg (has no administration in time range)  lactated ringers bolus 1,000 mL (0 mLs Intravenous Stopped 04/02/20 2117)    And  lactated ringers bolus 1,000 mL (0 mLs Intravenous Stopped 04/02/20 2208)    And  lactated ringers bolus 500 mL (0 mLs Intravenous Stopped 04/02/20 2338)  ceFEPIme (MAXIPIME) 2 g in sodium chloride 0.9 % 100 mL IVPB (0 g Intravenous Stopped 04/02/20 2139)   metroNIDAZOLE (FLAGYL) IVPB 500 mg (0 mg Intravenous Stopped 04/02/20 2152)  thiamine (B-1) injection 100 mg (100 mg Intravenous Given 04/02/20 2053)  acetaminophen (TYLENOL) tablet 500 mg (500 mg Oral Given 04/02/20 2307)    Mobility Walks at baseline

## 2020-04-02 NOTE — ED Notes (Signed)
X-ray at bedside

## 2020-04-02 NOTE — H&P (Signed)
History and Physical   Mathew [name] NOB:[id number] DOB: 03/29/1980 DOA: 04/02/2020  PCP: [name], MD   Patient coming from: Home  Chief Complaint: Lethargy and weakness, confusion  HPI: Mathew [name] is a 40 y.o. male with medical history significant of alcohol use, alcoholic liver cirrhosis, history of withdrawal, history of anemia, and hypoalbuminemia who presents with weakness, lethargy, confusion. History obtained with the assistance of an interpreter and with the assistance of his brother who was at bedside.  He was noted to have increased lethargy and weakness for the past couple of days.  He has had some nausea and vomiting but there was some description of possible coffee-ground emesis.  He was having difficulty with question for EMS and also it was reported that he had had some confusion which was new for him.  He does remember having nausea vomiting, but he denies any abdominal pain other than that associated with his vomiting.  He denies any chest pain, shortness of breath, constipation, diarrhea.  He has been working to reduce his alcohol intake and he had a little bit to drink 15 days ago but otherwise has not had anything to drink for the last 2 weeks.  ED Course: Vital signs in the ED significant for fever to 102.4, tachypnea in the 20s, tachycardia in the 100s.  Lab work-up showed CMP with sodium 130, bicarb 15, calcium 7.3 which corrects considering albumin of 2, protein 6.1, AST increased some from baseline at 111, T bili elevated at 4.  CBC showed leukocytosis 11.3, hemoglobin relatively stable at 8.4, platelets somewhat lower than baseline but relatively stable at 36.  PTT 46, PT 29.1, INR 2.9.  Lactic acid initially elevated at 5.1 with repeat pending.  Respiratory panel for flu and Covid is negative.  Urinalysis, urine culture, blood cultures pending.  Patient has been typed and screened.  Ethanol level is negative.  FOBT was negative.  Ammonia level 57.   Chest x-ray showed right upper lobe consolidation concerning for pneumonia.  Portable abdominal x-ray showed moderate gas distention but no signs of obstruction.  Patient received IV fluids, antibiotics, B1 in the ED.  Review of Systems: As per HPI otherwise all other systems reviewed and are negative.  Past Medical History:  Diagnosis Date   Alcohol withdrawal seizure (HCC)    ETOH abuse     Past Surgical History:  Procedure Laterality Date   COLONOSCOPY WITH PROPOFOL N/A 03/17/2018   Procedure: COLONOSCOPY WITH PROPOFOL;  Surgeon: Bernette Redbird, MD;  Location: [facility] ENDOSCOPY;  Service: Endoscopy;  Laterality: N/A;   ESOPHAGOGASTRODUODENOSCOPY (EGD) WITH PROPOFOL N/A 03/17/2018   Procedure: ESOPHAGOGASTRODUODENOSCOPY (EGD) WITH PROPOFOL;  Surgeon: Bernette Redbird, MD;  Location: [facility] ENDOSCOPY;  Service: Endoscopy;  Laterality: N/A;   IR PARACENTESIS  03/14/2018    Social History  reports that he has been smoking cigarettes. He started smoking about 7 months ago. He has been smoking about 0.25 packs per day. He has never used smokeless tobacco. He reports previous alcohol use. He reports that he does not use drugs.  No Known Allergies  Family History  Problem Relation Age of Onset   Cancer Neg Hx    Heart disease Neg Hx   Reviewed on admission  Prior to Admission medications   Medication Sig Start Date End Date Taking? Authorizing Provider  ferrous sulfate (FERROUSUL) 325 (65 FE) MG tablet Take 1 tablet (325 mg total) by mouth 2 (two) times daily with a meal. 02/03/20  Storm FriskWright, Patrick E, MD  folic acid (FOLVITE) 1 MG tablet Take 1 tablet (1 mg total) by mouth daily. 02/03/20   Storm FriskWright, Patrick E, MD  furosemide (LASIX) 40 MG tablet Take 1 tablet (40 mg total) by mouth daily as needed for edema. 03/31/20   Hoy RegisterNewlin, Enobong, MD  naltrexone (DEPADE) 50 MG tablet Take 1 tablet (50 mg total) by mouth daily. 03/31/20   Hoy RegisterNewlin, Enobong, MD  pantoprazole (PROTONIX) 40 MG tablet Take 1 tablet  (40 mg total) by mouth daily. 03/31/20   Hoy RegisterNewlin, Enobong, MD  spironolactone (ALDACTONE) 100 MG tablet Take 1 tablet (100 mg total) by mouth daily. 03/31/20   Hoy RegisterNewlin, Enobong, MD  thiamine 100 MG tablet Take 1 tablet (100 mg total) by mouth daily. 03/31/20   Hoy RegisterNewlin, Enobong, MD  Vitamin D, Ergocalciferol, (DRISDOL) 1.25 MG (50000 UNIT) CAPS capsule Take 1 capsule (50,000 Units total) by mouth every 7 (seven) days. 02/03/20   Storm FriskWright, Patrick E, MD    Physical Exam: Vitals:   04/02/20 2056 04/02/20 2200  BP: 111/63 123/82  Pulse: (!) 108 (!) 114  Resp: (!) 21 (!) 21  Temp: (!) 102.4 F (39.1 C)   TempSrc: Oral   SpO2: 98% 100%   Physical Exam Constitutional:      General: He is not in acute distress.    Appearance: Normal appearance. He is ill-appearing and diaphoretic.  HENT:     Head: Normocephalic and atraumatic.     Mouth/Throat:     Mouth: Mucous membranes are moist.     Pharynx: Oropharynx is clear.  Eyes:     Extraocular Movements: Extraocular movements intact.     Pupils: Pupils are equal, round, and reactive to light.  Cardiovascular:     Rate and Rhythm: Regular rhythm. Tachycardia present.     Pulses: Normal pulses.     Heart sounds: Normal heart sounds.  Pulmonary:     Effort: Pulmonary effort is normal. No respiratory distress.     Breath sounds: Wheezing and rhonchi present.     Comments: Tachypnea Abdominal:     General: Bowel sounds are normal. There is no distension.     Palpations: Abdomen is soft.     Tenderness: There is no abdominal tenderness.  Musculoskeletal:        General: No swelling or deformity.  Skin:    General: Skin is warm.  Neurological:     General: No focal deficit present.     Comments: Mild confusion.  Is able to answer questions appropriately through translator and with assistance of his brother.    Labs on Admission: I have personally reviewed following labs and imaging studies  CBC: Recent Labs  Lab 03/31/20 0905 04/02/20 2026   WBC 3.3* 11.3*  NEUTROABS 1.8 10.5*  HGB 9.0* 8.4*  HCT 28.3* 26.9*  MCV 84 87.9  PLT 44* 36*    Basic Metabolic Panel: Recent Labs  Lab 03/31/20 0905 04/02/20 2026  NA 138 130*  K 4.0 4.4  CL 104 107  CO2 21 15*  GLUCOSE 112* 100*  BUN 7 18  CREATININE 0.57* 0.63  CALCIUM 7.8* 7.3*    GFR: Estimated Creatinine Clearance: 117.7 mL/min (by C-G formula based on SCr of 0.63 mg/dL).  Liver Function Tests: Recent Labs  Lab 03/31/20 0905 04/02/20 2026  AST 62* 111*  ALT 21 26  ALKPHOS 196* 58  BILITOT 1.6* 4.0*  PROT 7.5 6.1*  ALBUMIN 2.7* 2.0*    Urine analysis:    Component  Value Date/Time   COLORURINE YELLOW 08/09/2019 1820   APPEARANCEUR CLEAR 08/09/2019 1820   LABSPEC 1.003 (L) 08/09/2019 1820   PHURINE 6.0 08/09/2019 1820   GLUCOSEU NEGATIVE 08/09/2019 1820   HGBUR NEGATIVE 08/09/2019 1820   BILIRUBINUR NEGATIVE 08/09/2019 1820   KETONESUR NEGATIVE 08/09/2019 1820   PROTEINUR NEGATIVE 08/09/2019 1820   UROBILINOGEN 0.2 08/02/2014 2045   NITRITE NEGATIVE 08/09/2019 1820   LEUKOCYTESUR NEGATIVE 08/09/2019 1820    Radiological Exams on Admission: DG Abdomen 1 View  Result Date: 04/02/2020 CLINICAL DATA:  Generalized weakness and lethargy for 2 days, vomiting EXAM: ABDOMEN - 1 VIEW COMPARISON:  None. FINDINGS: Two supine frontal views of the abdomen and pelvis are obtained. Moderate gaseous distention of the colon. No evidence of small-bowel obstruction or ileus. No masses or abnormal calcifications. No acute bony abnormalities. IMPRESSION: 1. No evidence of bowel obstruction or ileus. Moderate gas distention of the colon. Electronically Signed   By: Sharlet Salina M.D.   On: 04/02/2020 21:13   DG Chest Port 1 View  Result Date: 04/02/2020 CLINICAL DATA:  Weakness, lethargy for 2 days, emesis EXAM: PORTABLE CHEST 1 VIEW COMPARISON:  09/03/2019 FINDINGS: Single frontal view of the chest demonstrates an unremarkable cardiac silhouette. Increased density  right upper hemithorax consistent with underlying airspace disease and/or loculated effusion. No effusion or pneumothorax. No acute bony abnormalities. IMPRESSION: Right upper lobe consolidation and/or loculated effusion. Findings are consistent with underlying pneumonia. Electronically Signed   By: Sharlet Salina M.D.   On: 04/02/2020 21:16   EKG: Independently reviewed.  EKG unable to be viewed due to technical difficulties with EMR.  Assessment/Plan Principal Problem:   Severe sepsis (HCC) Active Problems:   ETOH abuse   Thrombocytopenia (HCC)   Alcoholic cirrhosis of liver without ascites (HCC)   Pneumonia  Severe sepsis Pneumonia > Patient meets criteria for sepsis with tachycardia in the 100s, tachypnea in the 20s, leukocytosis to 11.3, fever to 102.  Severe sepsis considering lactic acid elevated at 5.1. > Presumed source is his right upper lobe pneumonia. > Started on broad-spectrum antibiotics in the ED and has received 1.5 L of fluids. > Lactic acid has trended up from 5.1- 6.2 however the second lactic acid was drawn before completion of patient's last liter of IV fluid. > Blood pressure stable in ED - Monitor on progressive unit - Continue IV fluids - Continue broad-spectrum antibiotics: Vancomycin, cefepime, Flagyl - Continue to trend lactic acid - Monitor fever curve and white count  Alcohol use Alcoholic cirrhosis Thrombocytopenia > Patient has known alcoholic cirrhosis from chronic alcohol use. > He is try to cut back his last drink was 2 weeks ago > Alcohol negative in the ED > Platelets remain low at 36. > There was reports of coffee-ground emesis however his Hemoccult is negative in the ED and has had no episodes of vomiting in the ED.  Hemoglobin is also stable.  If there are any signs of hematemesis or drop in hemoglobin will need to give given his platelets are less than 50 and would need transfusion with active bleed. > Meld score currently 24 - We will hold  Lasix and spironolactone in the setting of severe sepsis as above - Continue PPI - Continue to trend CMP and coag studies - Platelet transfusion if any significant drop in hemoglobin or signs of bleeding - Continue thiamine and folate - Sips with meds for now -Repeat hemoglobin overnight to confirm no significant downtrend  DVT prophylaxis: SCDs considering thrombocytopenia  Code Status:   Full  Family Communication:  Brother updated at bedside. Disposition Plan:   Patient is from:  Home  Anticipated DC to:  Home  Anticipated DC date:  2 to 7 days  Anticipated DC barriers: None  Consults called:  None  Admission status:  Inpatient, progressive   Severity of Illness: The appropriate patient status for this patient is INPATIENT. Inpatient status is judged to be reasonable and necessary in order to provide the required intensity of service to ensure the patient's safety. The patient's presenting symptoms, physical exam findings, and initial radiographic and laboratory data in the context of their chronic comorbidities is felt to place them at high risk for further clinical deterioration. Furthermore, it is not anticipated that the patient will be medically stable for discharge from the hospital within 2 midnights of admission. The following factors support the patient status of inpatient.   " The patient's presenting symptoms include confusion, lethargy, weakness, nausea, vomiting. " The worrisome physical exam findings include tachycardia, tachypnea, rhonchi, wheeze. " The initial radiographic and laboratory data are worrisome because of hemoglobin 8.4 which is stable, leukocytosis 11.3, platelets 36.  Chest x-ray with right upper lobe consolidation. " The chronic co-morbidities include alcohol use, alcoholic liver cirrhosis.   * I certify that at the point of admission it is my clinical judgment that the patient will require inpatient hospital care spanning beyond 2 midnights from the  point of admission due to high intensity of service, high risk for further deterioration and high frequency of surveillance required.Synetta Fail MD [facility]  How to contact the [facility] Attending or Consulting provider 7A - 7P or covering provider during after hours 7P -7A, for this patient?   1. Check the care team in [facility] and look for a) attending/consulting [facility] provider listed and b) the [facility] team listed 2. Log into [email or web address] and use [facility] universal password to access. If you do not have the password, please contact the hospital operator. 3. Locate the [facility] provider you are looking for under [facility] and page to a number that you can be directly reached. 4. If you still have difficulty reaching the provider, please page the [facility] (Director on Call) for the Hospitalists listed on amion for assistance.  04/02/2020, 11:44 PM

## 2020-04-02 NOTE — Telephone Encounter (Signed)
Received a new hem referral from Dr. Alvis Lemmings for thrombocytopenia. [name] has been scheduled to see Dr. Leonides Schanz on 4/6 at 1pm. A msg has been sent to the referring office to notify the pt. Letter mailed.

## 2020-04-02 NOTE — Telephone Encounter (Signed)
Supportive resources for alcohol use was mailed to pt's residence on file due to inability to contact pt via telephone. Information includes AA meetings and contact info for [facility] for outpatient services.

## 2020-04-02 NOTE — Progress Notes (Signed)
A consult was received from an ED physician for cefepime per pharmacy dosing (for an indication other than meningitis). The patient's profile has been reviewed for ht/wt/allergies/indication/available labs. A one time order has been placed for the above antibiotics.  Further antibiotics/pharmacy consults should be ordered by admitting physician if indicated.                       Bernadene Person, PharmD, BCPS [phone] 04/02/2020, 8:30 PM

## 2020-04-02 NOTE — ED Provider Notes (Signed)
[facility]-EMERGENCY DEPT Provider Note   CSN: [id number] Arrival date & time: 04/02/20  2005     History Chief Complaint  Patient presents with  . Emesis    Mathew [name] is a 40 y.o. male.  HPI   Patient presented to the ED for evaluation of nausea and vomiting.  According to the patient's roommates he has had generalized weakness and lethargy the last couple days he has been having issues with nausea and vomiting.  They also reported a coffee-ground appearance to the emesis.  EMS and patient's friends also noted the patient appeared confused today which is unusual for him.  Patient does have a history of heavy alcohol use.  Patient is confused but is able to answer some questions.  He does state he has been having nausea and vomiting.  He denies any abdominal pain.  Patient is having difficulty providing any further history.  Past Medical History:  Diagnosis Date  . Alcohol withdrawal seizure (HCC)   . ETOH abuse     Patient Active Problem List   Diagnosis Date Noted  . Vitamin D deficiency 02/03/2020  . Edema due to hypoalbuminemia 12/20/2019  . Acute blood loss anemia 03/13/2018  . Alcoholic cirrhosis of liver without ascites (HCC) 03/13/2018  . ETOH abuse 07/09/2016  . Microcytic anemia 07/09/2016  . Thrombocytopenia (HCC) 07/09/2016    Past Surgical History:  Procedure Laterality Date  . COLONOSCOPY WITH PROPOFOL N/A 03/17/2018   Procedure: COLONOSCOPY WITH PROPOFOL;  Surgeon: Bernette Redbird, MD;  Location: [facility] ENDOSCOPY;  Service: Endoscopy;  Laterality: N/A;  . ESOPHAGOGASTRODUODENOSCOPY (EGD) WITH PROPOFOL N/A 03/17/2018   Procedure: ESOPHAGOGASTRODUODENOSCOPY (EGD) WITH PROPOFOL;  Surgeon: Bernette Redbird, MD;  Location: [facility] ENDOSCOPY;  Service: Endoscopy;  Laterality: N/A;  . IR PARACENTESIS  03/14/2018       Family History  Problem Relation Age of Onset  . Cancer Neg Hx   . Heart disease Neg Hx     Social History   Tobacco Use   . Smoking status: Current Every Day Smoker    Packs/day: 0.25    Types: Cigarettes    Start date: 08/11/2019  . Smokeless tobacco: Never Used  Vaping Use  . Vaping Use: Never used  Substance Use Topics  . Alcohol use: Not Currently    Comment: last beer 2x weeks ago  . Drug use: No    Home Medications Prior to Admission medications   Medication Sig Start Date End Date Taking? Authorizing Provider  ferrous sulfate (FERROUSUL) 325 (65 FE) MG tablet Take 1 tablet (325 mg total) by mouth 2 (two) times daily with a meal. 02/03/20   [name], MD  folic acid (FOLVITE) 1 MG tablet Take 1 tablet (1 mg total) by mouth daily. 02/03/20   [name], MD  furosemide (LASIX) 40 MG tablet Take 1 tablet (40 mg total) by mouth daily as needed for edema. 03/31/20   Hoy Register, MD  naltrexone (DEPADE) 50 MG tablet Take 1 tablet (50 mg total) by mouth daily. 03/31/20   Hoy Register, MD  pantoprazole (PROTONIX) 40 MG tablet Take 1 tablet (40 mg total) by mouth daily. 03/31/20   Hoy Register, MD  spironolactone (ALDACTONE) 100 MG tablet Take 1 tablet (100 mg total) by mouth daily. 03/31/20   Hoy Register, MD  thiamine 100 MG tablet Take 1 tablet (100 mg total) by mouth daily. 03/31/20   Hoy Register, MD  Vitamin D, Ergocalciferol, (DRISDOL) 1.25 MG (50000 UNIT)  CAPS capsule Take 1 capsule (50,000 Units total) by mouth every 7 (seven) days. 02/03/20   [name], MD    Allergies    Patient has no known allergies.  Review of Systems   Review of Systems  All other systems reviewed and are negative.   Physical Exam Updated Vital Signs BP 123/82   Pulse (!) 114   Temp (!) 102.4 F (39.1 C) (Oral)   Resp (!) 21   SpO2 100%   Physical Exam Vitals and nursing note reviewed.  Constitutional:      General: He is in acute distress.     Appearance: He is well-developed. He is ill-appearing. He is not diaphoretic.  HENT:     Head: Normocephalic and atraumatic.     Right  Ear: External ear normal.     Left Ear: External ear normal.     Mouth/Throat:     Mouth: Mucous membranes are dry.     Comments: Mucous membranes are dry, feculent odor to the breath Eyes:     General: No scleral icterus.       Right eye: No discharge.        Left eye: No discharge.     Conjunctiva/sclera: Conjunctivae normal.  Neck:     Trachea: No tracheal deviation.  Cardiovascular:     Rate and Rhythm: Normal rate and regular rhythm.  Pulmonary:     Effort: Pulmonary effort is normal. No respiratory distress.     Breath sounds: Normal breath sounds. No stridor. No wheezing or rales.  Abdominal:     General: Bowel sounds are normal. There is no distension.     Palpations: Abdomen is soft.     Tenderness: There is no abdominal tenderness. There is no guarding or rebound.  Musculoskeletal:        General: No tenderness.     Cervical back: Neck supple.  Skin:    General: Skin is warm and dry.     Findings: No rash.  Neurological:     Mental Status: He is alert. He is disoriented and confused.     Cranial Nerves: No cranial nerve deficit (no facial droop, extraocular movements intact, no slurred speech).     Sensory: No sensory deficit.     Motor: No abnormal muscle tone or seizure activity.     Coordination: Coordination normal.     Comments: Patient is aware he is in the hospital, confused about the date, slow to answer questions     ED Results / Procedures / Treatments   Labs (all labs ordered are listed, but only abnormal results are displayed) Labs Reviewed  LACTIC ACID, PLASMA - Abnormal; Notable for the following components:      Result Value   Lactic Acid, Venous 5.1 (*)    All other components within normal limits  COMPREHENSIVE METABOLIC PANEL - Abnormal; Notable for the following components:   Sodium 130 (*)    CO2 15 (*)    Glucose, Bld 100 (*)    Calcium 7.3 (*)    Total Protein 6.1 (*)    Albumin 2.0 (*)    AST 111 (*)    Total Bilirubin 4.0 (*)     All other components within normal limits  CBC WITH DIFFERENTIAL/PLATELET - Abnormal; Notable for the following components:   WBC 11.3 (*)    RBC 3.06 (*)    Hemoglobin 8.4 (*)    HCT 26.9 (*)    RDW 17.0 (*)  Platelets 36 (*)    Neutro Abs 10.5 (*)    Lymphs Abs 0.2 (*)    Abs Immature Granulocytes 0.08 (*)    All other components within normal limits  PROTIME-INR - Abnormal; Notable for the following components:   Prothrombin Time 29.1 (*)    INR 2.9 (*)    All other components within normal limits  APTT - Abnormal; Notable for the following components:   aPTT 46 (*)    All other components within normal limits  RESP PANEL BY RT-PCR (FLU A&B, COVID) ARPGX2  CULTURE, BLOOD (ROUTINE X 2)  CULTURE, BLOOD (ROUTINE X 2)  URINE CULTURE  ETHANOL  LACTIC ACID, PLASMA  URINALYSIS, ROUTINE W REFLEX MICROSCOPIC  AMMONIA  POC OCCULT BLOOD, ED  TYPE AND SCREEN    EKG EKG Interpretation  Date/Time:  Thursday April 02 2020 20:55:21 EDT Ventricular Rate:  112 PR Interval:    QRS Duration: 112 QT Interval:  379 QTC Calculation: 518 R Axis:   -6 Text Interpretation: Sinus tachycardia Borderline intraventricular conduction delay Prolonged QT interval Since last tracing rate faster Confirmed by Linwood Dibbles [phone]) on 04/02/2020 9:02:46 PM   Radiology DG Abdomen 1 View  Result Date: 04/02/2020 CLINICAL DATA:  Generalized weakness and lethargy for 2 days, vomiting EXAM: ABDOMEN - 1 VIEW COMPARISON:  None. FINDINGS: Two supine frontal views of the abdomen and pelvis are obtained. Moderate gaseous distention of the colon. No evidence of small-bowel obstruction or ileus. No masses or abnormal calcifications. No acute bony abnormalities. IMPRESSION: 1. No evidence of bowel obstruction or ileus. Moderate gas distention of the colon. Electronically Signed   By: Sharlet Salina M.D.   On: 04/02/2020 21:13   DG Chest Port 1 View  Result Date: 04/02/2020 CLINICAL DATA:  Weakness, lethargy for 2  days, emesis EXAM: PORTABLE CHEST 1 VIEW COMPARISON:  09/03/2019 FINDINGS: Single frontal view of the chest demonstrates an unremarkable cardiac silhouette. Increased density right upper hemithorax consistent with underlying airspace disease and/or loculated effusion. No effusion or pneumothorax. No acute bony abnormalities. IMPRESSION: Right upper lobe consolidation and/or loculated effusion. Findings are consistent with underlying pneumonia. Electronically Signed   By: Sharlet Salina M.D.   On: 04/02/2020 21:16    Procedures .Critical Care Performed by: Linwood Dibbles, MD Authorized by: Linwood Dibbles, MD   Critical care provider statement:    Critical care time (minutes):  45   Critical care was time spent personally by me on the following activities:  Discussions with consultants, evaluation of patient's response to treatment, examination of patient, ordering and performing treatments and interventions, ordering and review of laboratory studies, ordering and review of radiographic studies, pulse oximetry, re-evaluation of patient's condition, obtaining history from patient or surrogate and review of old charts     Medications Ordered in ED Medications  lactated ringers infusion (has no administration in time range)  lactated ringers bolus 1,000 mL (0 mLs Intravenous Stopped 04/02/20 2117)    And  lactated ringers bolus 1,000 mL (1,000 mLs Intravenous Bolus from Bag 04/02/20 2138)    And  lactated ringers bolus 500 mL (has no administration in time range)  acetaminophen (TYLENOL) tablet 500 mg (has no administration in time range)  ceFEPIme (MAXIPIME) 2 g in sodium chloride 0.9 % 100 mL IVPB (0 g Intravenous Stopped 04/02/20 2139)  metroNIDAZOLE (FLAGYL) IVPB 500 mg (500 mg Intravenous New Bag/Given 04/02/20 2052)  thiamine (B-1) injection 100 mg (100 mg Intravenous Given 04/02/20 2053)    ED Course  I have reviewed the triage vital signs and the nursing notes.  Pertinent labs & imaging results  that were available during my care of the patient were reviewed by me and considered in my medical decision making (see chart for details).  Clinical Course as of 04/02/20 2250  Thu Apr 02, 2020  2159 Patient's laboratory test shows decreased bicarb. [JK]  2159 Chest x-ray shows evidence of underlying pneumonia.  Abdominal film without acute obstruction. [JK]  2200 Hyperbilirubinemia is increased from previous [JK]  2211 Patient's lactic acid level is severely elevated.  Patient has been previously started on sepsis protocol. [JK]    Clinical Course User Index [JK] Linwood DibblesKnapp, Benjermin Korber, MD   MDM Rules/Calculators/A&P                          Patient presented to ED for evaluation of fevers nausea and vomiting.  Patient also appeared to have some confusion.  Patient had an elevated lactic acid level and temperature of 102.4.  He had a leukocytosis and anemia but was guaiac negative.  Laboratory tests do show signs of known anion gap metabolic acidosis as well as elevated bilirubin.  Records indicate patient does have history of cirrhosis.  Ammonia level is elevated.  I am concerned about the possibility of hepatic encephalopathy.  Abdominal x-ray does not show obstruction but chest x-ray does show evidence of pneumonia.  This appears to be the source of infection.  Patient has been started on empiric antibiotics.  He also has been given IV fluid continued on sepsis protocol.  I will consult the medical service for admission and further treatment. Final Clinical Impression(s) / ED Diagnoses Final diagnoses:  Sepsis with encephalopathy, due to unspecified organism, unspecified whether septic shock present [facility](HCC)  Community acquired pneumonia, unspecified laterality      Linwood DibblesKnapp, Cornelius Schuitema, MD 04/02/20 2250

## 2020-04-02 NOTE — Telephone Encounter (Signed)
Created in error

## 2020-04-02 NOTE — ED Triage Notes (Signed)
Pt roommates reports pt has had generalized weakness/lethargy x2days.  Pt also reported coffee ground emesis. Ems reports pt had difficulty answering questions; confusion not baseline

## 2020-04-03 ENCOUNTER — Other Ambulatory Visit: Payer: Self-pay

## 2020-04-03 ENCOUNTER — Inpatient Hospital Stay (HOSPITAL_COMMUNITY): Payer: Self-pay

## 2020-04-03 ENCOUNTER — Telehealth: Payer: Self-pay

## 2020-04-03 DIAGNOSIS — R7989 Other specified abnormal findings of blood chemistry: Secondary | ICD-10-CM

## 2020-04-03 DIAGNOSIS — R6520 Severe sepsis without septic shock: Secondary | ICD-10-CM

## 2020-04-03 DIAGNOSIS — J189 Pneumonia, unspecified organism: Secondary | ICD-10-CM

## 2020-04-03 DIAGNOSIS — F1010 Alcohol abuse, uncomplicated: Secondary | ICD-10-CM

## 2020-04-03 DIAGNOSIS — A419 Sepsis, unspecified organism: Principal | ICD-10-CM

## 2020-04-03 LAB — CBC
HCT: 25.8 % — ABNORMAL LOW (ref 39.0–52.0)
HCT: 25.3 % — ABNORMAL LOW (ref 39.0–52.0)
Hemoglobin: 8 g/dL — ABNORMAL LOW (ref 13.0–17.0)
Hemoglobin: 7.9 g/dL — ABNORMAL LOW (ref 13.0–17.0)
MCH: 27.3 pg (ref 26.0–34.0)
MCH: 27.5 pg (ref 26.0–34.0)
MCHC: 31 g/dL (ref 30.0–36.0)
MCHC: 31.2 g/dL (ref 30.0–36.0)
MCV: 88.1 fL (ref 80.0–100.0)
MCV: 88.2 fL (ref 80.0–100.0)
Platelets: 25 10*3/uL — CL (ref 150–400)
Platelets: 24 10*3/uL — CL (ref 150–400)
RBC: 2.93 MIL/uL — ABNORMAL LOW (ref 4.22–5.81)
RBC: 2.87 MIL/uL — ABNORMAL LOW (ref 4.22–5.81)
RDW: 16.7 % — ABNORMAL HIGH (ref 11.5–15.5)
RDW: 16.8 % — ABNORMAL HIGH (ref 11.5–15.5)
WBC: 11.5 10*3/uL — ABNORMAL HIGH (ref 4.0–10.5)
WBC: 11.7 10*3/uL — ABNORMAL HIGH (ref 4.0–10.5)
nRBC: 0 % (ref 0.0–0.2)
nRBC: 0 % (ref 0.0–0.2)

## 2020-04-03 LAB — BLOOD CULTURE ID PANEL (REFLEXED) - BCID2

## 2020-04-03 LAB — COMPREHENSIVE METABOLIC PANEL
ALT: 19 U/L (ref 0–44)
AST: 61 U/L — ABNORMAL HIGH (ref 15–41)
Albumin: 1.8 g/dL — ABNORMAL LOW (ref 3.5–5.0)
Alkaline Phosphatase: 51 U/L (ref 38–126)
Anion gap: 7 (ref 5–15)
BUN: 16 mg/dL (ref 6–20)
CO2: 18 mmol/L — ABNORMAL LOW (ref 22–32)
Calcium: 7.4 mg/dL — ABNORMAL LOW (ref 8.9–10.3)
Chloride: 108 mmol/L (ref 98–111)
Creatinine, Ser: 0.71 mg/dL (ref 0.61–1.24)
GFR, Estimated: >60 mL/min (ref >60)
Glucose, Bld: 92 mg/dL (ref 70–99)
Potassium: 2.6 mmol/L — CL (ref 3.5–5.1)
Sodium: 133 mmol/L — ABNORMAL LOW (ref 135–145)
Total Bilirubin: 3.5 mg/dL — ABNORMAL HIGH (ref 0.3–1.2)
Total Protein: 5.7 g/dL — ABNORMAL LOW (ref 6.5–8.1)

## 2020-04-03 LAB — TYPE AND SCREEN
ABO/RH(D): O POS
Antibody Screen: NEGATIVE

## 2020-04-03 LAB — PROTIME-INR
INR: 3 — ABNORMAL HIGH (ref 0.8–1.2)
Prothrombin Time: 30.5 seconds — ABNORMAL HIGH (ref 11.4–15.2)

## 2020-04-03 LAB — LACTIC ACID, PLASMA
Lactic Acid, Venous: 4.4 mmol/L (ref 0.5–1.9)
Lactic Acid, Venous: 5.5 mmol/L (ref 0.5–1.9)

## 2020-04-03 LAB — MAGNESIUM: Magnesium: 1.3 mg/dL — ABNORMAL LOW (ref 1.7–2.4)

## 2020-04-03 LAB — HIV ANTIBODY (ROUTINE TESTING W REFLEX): HIV Screen 4th Generation wRfx: NONREACTIVE

## 2020-04-03 LAB — PHOSPHORUS: Phosphorus: 2 mg/dL — ABNORMAL LOW (ref 2.5–4.6)

## 2020-04-03 LAB — PROCALCITONIN: Procalcitonin: 5.64 ng/mL

## 2020-04-03 LAB — CORTISOL-AM, BLOOD: Cortisol - AM: 13.8 ug/dL (ref 6.7–22.6)

## 2020-04-03 MED ORDER — POTASSIUM CHLORIDE 10 MEQ/100ML IV SOLN
10.0000 meq | INTRAVENOUS | Status: AC
  Administered 2020-04-03 (×6): 10 meq via INTRAVENOUS
  Filled 2020-04-03 (×2): qty 100

## 2020-04-03 MED ORDER — LACTULOSE ENEMA
300.0000 mL | Freq: Once | ORAL | Status: AC
  Administered 2020-04-03: 300 mL via RECTAL
  Filled 2020-04-03: qty 300

## 2020-04-03 MED ORDER — SODIUM CHLORIDE 0.9 % IV SOLN
500.0000 mg | INTRAVENOUS | Status: AC
  Administered 2020-04-03 – 2020-04-07 (×5): 500 mg via INTRAVENOUS
  Filled 2020-04-03 (×5): qty 500

## 2020-04-03 MED ORDER — POTASSIUM PHOSPHATES 15 MMOLE/5ML IV SOLN
20.0000 mmol | Freq: Once | INTRAVENOUS | Status: AC
  Administered 2020-04-03: 20 mmol via INTRAVENOUS
  Filled 2020-04-03: qty 6.67

## 2020-04-03 MED ORDER — SODIUM CHLORIDE 0.9 % IV SOLN
2.0000 g | INTRAVENOUS | Status: AC
  Administered 2020-04-03 – 2020-04-06 (×4): 2 g via INTRAVENOUS
  Filled 2020-04-03 (×2): qty 20
  Filled 2020-04-03: qty 2
  Filled 2020-04-03 (×3): qty 20

## 2020-04-03 MED ORDER — MAGNESIUM SULFATE 2 GM/50ML IV SOLN
2.0000 g | Freq: Once | INTRAVENOUS | Status: AC
  Administered 2020-04-03: 2 g via INTRAVENOUS
  Filled 2020-04-03: qty 50

## 2020-04-03 MED ORDER — LACTULOSE 10 GM/15ML PO SOLN
20.0000 g | Freq: Two times a day (BID) | ORAL | Status: DC
  Administered 2020-04-03 – 2020-04-06 (×8): 20 g via ORAL
  Filled 2020-04-03 (×9): qty 30

## 2020-04-03 NOTE — Progress Notes (Signed)
PROGRESS NOTE    Mathew [name]  RUE:[id number]:[id number] DOB: 1980/06/02 DOA: 04/02/2020 PCP: Storm FriskWright, Patrick E, MD    Brief Narrative:  40 y.o. male with medical history significant of alcohol use, alcoholic liver cirrhosis, history of withdrawal, history of anemia, and hypoalbuminemia who presents with weakness, lethargy, confusion. Patient was admitted for concerns of pneumonia with sepsis  Assessment & Plan:   Principal Problem:   Severe sepsis (HCC) Active Problems:   ETOH abuse   Thrombocytopenia (HCC)   Alcoholic cirrhosis of liver without ascites (HCC)   Pneumonia  Severe sepsis with Pneumonia present on admit > Patient meets criteria for sepsis with tachycardia in the 100s, tachypnea in the 20s, leukocytosis to 11.3, fever to 102.  Severe sepsis considering lactic acid elevated at 5.1. > Presumed source is his right upper lobe pneumonia. > Started on broad-spectrum antibiotics in the ED and has received 1.5 L of fluids. > Blood pressure stable in ED - Continue IV fluids - Have ordered empiric azithromycin and rocephin - Continue to trend lactic acid - Repeat cbc in AM  Alcoholic cirrhosis > Patient has known alcoholic cirrhosis from chronic alcohol use. > He is try to cut back his last drink was 2 weeks prior to admit > Alcohol negative in the ED > Pt noted to be thrombocytopenic, see below > Meld score 24 at time of presentation - Lasix and spironolactone were held in the setting of severe sepsis - Continue thiamine and folate - Will check  RUQ US -Repeat hemoglobin overnight to confirm no significant downtrend -Ammonia level is elevated at 57, will order scheduled lactulose  Hepatic encephalopathy -staff reports periods of lethargy -ammonia level is elevated -lactulose per above  Thrombocytopenia -Patient has baseline thrombocytopenia -Plts are trending down, likely secondary to presenting sepsis -Avoid anticoagulation/antiplatelet unless necessary -Repeat  CBC in AM  Hx ETOH abuse -Reportedly last ETOH intake was 2 weeks prior to visit  DVT prophylaxis: SCD's Code Status: Full Family Communication: Pt in room, family not at bedside  Status is: Inpatient  Remains inpatient appropriate because:IV treatments appropriate due to intensity of illness or inability to take PO and Inpatient level of care appropriate due to severity of illness   Dispo: The patient is from: Home              Anticipated d/c is to: Home              Patient currently is not medically stable to d/c.   Difficult to place patient No   Consultants:     Procedures:     Antimicrobials: Anti-infectives (From admission, onward)   Start     Dose/Rate Route Frequency Ordered Stop   04/03/20 0900  cefTRIAXone (ROCEPHIN) 2 g in sodium chloride 0.9 % 100 mL IVPB        2 g 200 mL/hr over 30 Minutes Intravenous Every 24 hours 04/03/20 0809 04/07/20 1259   04/03/20 0900  azithromycin (ZITHROMAX) 500 mg in sodium chloride 0.9 % 250 mL IVPB        500 mg 250 mL/hr over 60 Minutes Intravenous Every 24 hours 04/03/20 0809 04/08/20 0859   04/02/20 2030  ceFEPIme (MAXIPIME) 2 g in sodium chloride 0.9 % 100 mL IVPB        2 g 200 mL/hr over 30 Minutes Intravenous  Once 04/02/20 2026 04/02/20 2139   04/02/20 2030  metroNIDAZOLE (FLAGYL) IVPB 500 mg        500 mg 100 mL/hr  over 60 Minutes Intravenous  Once 04/02/20 2026 04/02/20 2152       Subjective: Wanting to try something to eat  Objective: Vitals:   04/03/20 0329 04/03/20 0554 04/03/20 0957 04/03/20 1339  BP:  106/67 113/74 110/68  Pulse:  95 94 90  Resp:   18 20  Temp:  98.7 F (37.1 C) 98.4 F (36.9 C) 98.6 F (37 C)  TempSrc:  Oral Oral   SpO2:  97% 100% 100%  Weight: 79.4 kg     Height: 5\' 5"  (1.651 m)       Intake/Output Summary (Last 24 hours) at 04/03/2020 1449 Last data filed at 04/03/2020 04/05/2020 Gross per 24 hour  Intake 682.11 ml  Output 500 ml  Net 182.11 ml   Filed Weights   04/03/20  0329  Weight: 79.4 kg    Examination:  General exam: Appears calm and comfortable  Respiratory system: Clear to auscultation. Respiratory effort normal. Cardiovascular system: S1 & S2 heard, tachycardic Gastrointestinal system: Abdomen is nondistended, soft and nontender. No organomegaly or masses felt. Normal bowel sounds heard. Central nervous system: Alert and oriented. No focal neurological deficits. Extremities: Symmetric 5 x 5 power. Skin: No rashes, lesions  Psychiatry: Judgement and insight appear normal. Mood & affect appropriate.   Data Reviewed: I have personally reviewed following labs and imaging studies  CBC: Recent Labs  Lab 03/31/20 0905 04/02/20 2026 04/03/20 0009 04/03/20 0330  WBC 3.3* 11.3* 11.5* 11.7*  NEUTROABS 1.8 10.5*  --   --   HGB 9.0* 8.4* 8.0* 7.9*  HCT 28.3* 26.9* 25.8* 25.3*  MCV 84 87.9 88.1 88.2  PLT 44* 36* 25* 24*   Basic Metabolic Panel: Recent Labs  Lab 03/31/20 0905 04/02/20 2026 04/03/20 0009 04/03/20 0330  NA 138 130*  --  133*  K 4.0 4.4  --  2.6*  CL 104 107  --  108  CO2 21 15*  --  18*  GLUCOSE 112* 100*  --  92  BUN 7 18  --  16  CREATININE 0.57* 0.63  --  0.71  CALCIUM 7.8* 7.3*  --  7.4*  MG  --   --  1.3*  --   PHOS  --   --  2.0*  --    GFR: Estimated Creatinine Clearance: 118.1 mL/min (by C-G formula based on SCr of 0.71 mg/dL). Liver Function Tests: Recent Labs  Lab 03/31/20 0905 04/02/20 2026 04/03/20 0330  AST 62* 111* 61*  ALT 21 26 19   ALKPHOS 196* 58 51  BILITOT 1.6* 4.0* 3.5*  PROT 7.5 6.1* 5.7*  ALBUMIN 2.7* 2.0* 1.8*   No results for input(s): LIPASE, AMYLASE in the last 168 hours. Recent Labs  Lab 04/02/20 2200  AMMONIA 57*   Coagulation Profile: Recent Labs  Lab 03/31/20 0905 04/02/20 2026 04/03/20 0330  INR 1.4* 2.9* 3.0*   Cardiac Enzymes: No results for input(s): CKTOTAL, CKMB, CKMBINDEX, TROPONINI in the last 168 hours. BNP (last 3 results) No results for input(s): PROBNP  in the last 8760 hours. HbA1C: No results for input(s): HGBA1C in the last 72 hours. CBG: No results for input(s): GLUCAP in the last 168 hours. Lipid Profile: No results for input(s): CHOL, HDL, LDLCALC, TRIG, CHOLHDL, LDLDIRECT in the last 72 hours. Thyroid Function Tests: No results for input(s): TSH, T4TOTAL, FREET4, T3FREE, THYROIDAB in the last 72 hours. Anemia Panel: No results for input(s): VITAMINB12, FOLATE, FERRITIN, TIBC, IRON, RETICCTPCT in the last 72 hours. Sepsis Labs: Recent Labs  Lab 04/02/20 2026 04/02/20 2226 04/03/20 0009 04/03/20 0330  PROCALCITON  --   --   --  5.64  LATICACIDVEN 5.1* 6.2* 5.5* 4.4*    Recent Results (from the past 240 hour(s))  Resp Panel by RT-PCR (Flu A&B, Covid) Nasopharyngeal Swab     Status: None   Collection Time: 04/02/20  9:45 PM   Specimen: Nasopharyngeal Swab; Nasopharyngeal(NP) swabs in vial transport medium  Result Value Ref Range Status   SARS Coronavirus 2 by RT PCR NEGATIVE NEGATIVE Final    Comment: (NOTE) SARS-CoV-2 target nucleic acids are NOT DETECTED.  The SARS-CoV-2 RNA is generally detectable in upper respiratory specimens during the acute phase of infection. The lowest concentration of SARS-CoV-2 viral copies this assay can detect is 138 copies/mL. A negative result does not preclude SARS-Cov-2 infection and should not be used as the sole basis for treatment or other patient management decisions. A negative result may occur with  improper specimen collection/handling, submission of specimen other than nasopharyngeal swab, presence of viral mutation(s) within the areas targeted by this assay, and inadequate number of viral copies(<138 copies/mL). A negative result must be combined with clinical observations, patient history, and epidemiological information. The expected result is Negative.  Fact Sheet for Patients:  BloggerCourse.com  Fact Sheet for Healthcare Providers:   SeriousBroker.it  This test is no t yet approved or cleared by the [place] FDA and  has been authorized for detection and/or diagnosis of SARS-CoV-2 by FDA under an Emergency Use Authorization (EUA). This EUA will remain  in effect (meaning this test can be used) for the duration of the COVID-19 declaration under Section 564(b)(1) of the Act, 21 U.S.C.section 360bbb-3(b)(1), unless the authorization is terminated  or revoked sooner.       Influenza A by PCR NEGATIVE NEGATIVE Final   Influenza B by PCR NEGATIVE NEGATIVE Final    Comment: (NOTE) The Xpert Xpress SARS-CoV-2/FLU/RSV plus assay is intended as an aid in the diagnosis of influenza from Nasopharyngeal swab specimens and should not be used as a sole basis for treatment. Nasal washings and aspirates are unacceptable for Xpert Xpress SARS-CoV-2/FLU/RSV testing.  Fact Sheet for Patients: BloggerCourse.com  Fact Sheet for Healthcare Providers: SeriousBroker.it  This test is not yet approved or cleared by the [place] FDA and has been authorized for detection and/or diagnosis of SARS-CoV-2 by FDA under an Emergency Use Authorization (EUA). This EUA will remain in effect (meaning this test can be used) for the duration of the COVID-19 declaration under Section 564(b)(1) of the Act, 21 U.S.C. section 360bbb-3(b)(1), unless the authorization is terminated or revoked.  Performed at [facility], [address], [place], [place] [address]      Radiology Studies: DG Abdomen 1 View  Result Date: 04/02/2020 CLINICAL DATA:  Generalized weakness and lethargy for 2 days, vomiting EXAM: ABDOMEN - 1 VIEW COMPARISON:  None. FINDINGS: Two supine frontal views of the abdomen and pelvis are obtained. Moderate gaseous distention of the colon. No evidence of small-bowel obstruction or ileus. No masses or abnormal calcifications. No  acute bony abnormalities. IMPRESSION: 1. No evidence of bowel obstruction or ileus. Moderate gas distention of the colon. Electronically Signed   By: Sharlet Salina M.D.   On: 04/02/2020 21:13   DG Chest Port 1 View  Result Date: 04/02/2020 CLINICAL DATA:  Weakness, lethargy for 2 days, emesis EXAM: PORTABLE CHEST 1 VIEW COMPARISON:  09/03/2019 FINDINGS: Single frontal view of the chest demonstrates an unremarkable cardiac silhouette.  Increased density right upper hemithorax consistent with underlying airspace disease and/or loculated effusion. No effusion or pneumothorax. No acute bony abnormalities. IMPRESSION: Right upper lobe consolidation and/or loculated effusion. Findings are consistent with underlying pneumonia. Electronically Signed   By: Sharlet Salina M.D.   On: 04/02/2020 21:16   US Abdomen Limited RUQ (LIVER/GB)  Result Date: 04/03/2020 CLINICAL DATA:  Elevated liver function tests. EXAM: ULTRASOUND ABDOMEN LIMITED RIGHT UPPER QUADRANT COMPARISON:  March 14, 2018. FINDINGS: Gallbladder: No cholelithiasis is noted. Mild to moderate gallbladder wall thickening is noted at approximately 5 mm. No sonographic Murphy's sign is noted. No pericholecystic fluid is noted. Common bile duct: Diameter: 6 mm which is within normal limits. Liver: No focal lesion identified. Increased echogenicity of hepatic parenchyma is noted with slightly nodular hepatic contours suggesting possible hepatic cirrhosis. Portal vein is patent on color Doppler imaging with normal direction of blood flow towards the liver. Other: None. IMPRESSION: Increased echogenicity of hepatic parenchyma is noted with slightly nodular hepatic contours suggesting of hepatic cirrhosis. No definite focal sonographic hepatic abnormality is noted. Mild to moderate gallbladder wall thickening is noted which most likely is due to adjacent hepatocellular disease. No definite cholelithiasis is noted. Electronically Signed   By: Lupita Raider M.D.    On: 04/03/2020 12:49    Scheduled Meds: . folic acid  1 mg Oral Daily  . pantoprazole  40 mg Oral Daily  . sodium chloride flush  3 mL Intravenous Q12H  . thiamine  100 mg Oral Daily   Continuous Infusions: . azithromycin 500 mg (04/03/20 0949)  . cefTRIAXone (ROCEPHIN)  IV 2 g (04/03/20 1400)  . lactated ringers 75 mL/hr at 04/03/20 0048  . potassium chloride 10 mEq (04/03/20 1413)     LOS: 1 day   Rickey Barbara, MD [facility] Pager On Amion  If 7PM-7AM, please contact night-coverage 04/03/2020, 2:49 PM

## 2020-04-03 NOTE — Telephone Encounter (Signed)
Pt has no VM set up to leave message.  Letter will be mailed.

## 2020-04-03 NOTE — ED Notes (Signed)
Pt orientation is improving. He is oriented to self, place, and mostly understands his situation.

## 2020-04-03 NOTE — Progress Notes (Signed)
PHARMACY - PHYSICIAN COMMUNICATION CRITICAL VALUE ALERT - BLOOD CULTURE IDENTIFICATION (BCID)  Mathew [name] is an 40 y.o. male who presented to [facility] on 04/02/2020   Assessment: Pt with PMH significant for EtOH abuse and cirrhosis. Currently on antibiotics for PNA.   BCID + 1/4 staph epi  Name of physician (or Provider) Contacted: Floor coverage: X Blount  Current antibiotics: Ceftriaxone + azithromycin  Changes to prescribed antibiotics recommended:  None, likely contaminant  Results for orders placed or performed during the hospital encounter of 04/02/20  Blood Culture ID Panel (Reflexed) (Collected: 04/02/2020  8:26 PM)  Result Value Ref Range   Enterococcus faecalis NOT DETECTED NOT DETECTED   Enterococcus Faecium NOT DETECTED NOT DETECTED   Listeria monocytogenes NOT DETECTED NOT DETECTED   Staphylococcus species DETECTED (A) NOT DETECTED   Staphylococcus aureus (BCID) NOT DETECTED NOT DETECTED   Staphylococcus epidermidis DETECTED (A) NOT DETECTED   Staphylococcus lugdunensis NOT DETECTED NOT DETECTED   Streptococcus species NOT DETECTED NOT DETECTED   Streptococcus agalactiae NOT DETECTED NOT DETECTED   Streptococcus pneumoniae NOT DETECTED NOT DETECTED   Streptococcus pyogenes NOT DETECTED NOT DETECTED   A.calcoaceticus-baumannii NOT DETECTED NOT DETECTED   Bacteroides fragilis NOT DETECTED NOT DETECTED   Enterobacterales NOT DETECTED NOT DETECTED   Enterobacter cloacae complex NOT DETECTED NOT DETECTED   Escherichia coli NOT DETECTED NOT DETECTED   Klebsiella aerogenes NOT DETECTED NOT DETECTED   Klebsiella oxytoca NOT DETECTED NOT DETECTED   Klebsiella pneumoniae NOT DETECTED NOT DETECTED   Proteus species NOT DETECTED NOT DETECTED   Salmonella species NOT DETECTED NOT DETECTED   Serratia marcescens NOT DETECTED NOT DETECTED   Haemophilus influenzae NOT DETECTED NOT DETECTED   Neisseria meningitidis NOT DETECTED NOT DETECTED   Pseudomonas aeruginosa  NOT DETECTED NOT DETECTED   Stenotrophomonas maltophilia NOT DETECTED NOT DETECTED   Candida albicans NOT DETECTED NOT DETECTED   Candida auris NOT DETECTED NOT DETECTED   Candida glabrata NOT DETECTED NOT DETECTED   Candida krusei NOT DETECTED NOT DETECTED   Candida parapsilosis NOT DETECTED NOT DETECTED   Candida tropicalis NOT DETECTED NOT DETECTED   Cryptococcus neoformans/gattii NOT DETECTED NOT DETECTED   Methicillin resistance mecA/C NOT DETECTED NOT DETECTED    Cindi Carbon, PharmD 04/03/2020  7:46 PM

## 2020-04-03 NOTE — Plan of Care (Signed)
  Problem: Education: Goal: Knowledge of General Education information will improve Description: Including pain rating scale, medication(s)/side effects and non-pharmacologic comfort measures Outcome: Progressing   Problem: Clinical Measurements: Goal: Diagnostic test results will improve Outcome: Progressing   Problem: Safety: Goal: Ability to remain free from injury will improve Outcome: Progressing   Problem: Elimination: Goal: Will not experience complications related to bowel motility Outcome: Progressing Goal: Will not experience complications related to urinary retention Outcome: Progressing

## 2020-04-03 NOTE — Telephone Encounter (Signed)
-----   Message from Hoy Register, MD sent at 04/01/2020  5:08 PM EDT ----- Please inform him that his liver enzymes are elevated, platelets are severely decreased and he is at risk of bleeding which is due to alcohol consumption.  I have referred him to hematology to evaluate his low platelet counts but I will recommend that he work on quitting alcohol.

## 2020-04-04 ENCOUNTER — Inpatient Hospital Stay (HOSPITAL_COMMUNITY): Payer: Self-pay

## 2020-04-04 LAB — CBC
HCT: 29.4 % — ABNORMAL LOW (ref 39.0–52.0)
Hemoglobin: 9 g/dL — ABNORMAL LOW (ref 13.0–17.0)
MCH: 26.8 pg (ref 26.0–34.0)
MCHC: 30.6 g/dL (ref 30.0–36.0)
MCV: 87.5 fL (ref 80.0–100.0)
Platelets: 32 10*3/uL — ABNORMAL LOW (ref 150–400)
RBC: 3.36 MIL/uL — ABNORMAL LOW (ref 4.22–5.81)
RDW: 16.9 % — ABNORMAL HIGH (ref 11.5–15.5)
WBC: 5.5 10*3/uL (ref 4.0–10.5)
nRBC: 0.5 % — ABNORMAL HIGH (ref 0.0–0.2)

## 2020-04-04 LAB — COMPREHENSIVE METABOLIC PANEL
ALT: 28 U/L (ref 0–44)
AST: 80 U/L — ABNORMAL HIGH (ref 15–41)
Albumin: 1.7 g/dL — ABNORMAL LOW (ref 3.5–5.0)
Alkaline Phosphatase: 49 U/L (ref 38–126)
Anion gap: 4 — ABNORMAL LOW (ref 5–15)
BUN: 13 mg/dL (ref 6–20)
CO2: 20 mmol/L — ABNORMAL LOW (ref 22–32)
Calcium: 7.4 mg/dL — ABNORMAL LOW (ref 8.9–10.3)
Chloride: 107 mmol/L (ref 98–111)
Creatinine, Ser: 0.5 mg/dL — ABNORMAL LOW (ref 0.61–1.24)
GFR, Estimated: >60 mL/min (ref >60)
Glucose, Bld: 92 mg/dL (ref 70–99)
Potassium: 2.8 mmol/L — ABNORMAL LOW (ref 3.5–5.1)
Sodium: 131 mmol/L — ABNORMAL LOW (ref 135–145)
Total Bilirubin: 2.8 mg/dL — ABNORMAL HIGH (ref 0.3–1.2)
Total Protein: 5.3 g/dL — ABNORMAL LOW (ref 6.5–8.1)

## 2020-04-04 LAB — URINALYSIS, ROUTINE W REFLEX MICROSCOPIC
Bacteria, UA: NONE SEEN
Bilirubin Urine: NEGATIVE
Glucose, UA: NEGATIVE mg/dL
Ketones, ur: NEGATIVE mg/dL
Leukocytes,Ua: NEGATIVE
Nitrite: NEGATIVE
Protein, ur: NEGATIVE mg/dL
Specific Gravity, Urine: 1.025 (ref 1.005–1.030)
pH: 6 (ref 5.0–8.0)

## 2020-04-04 LAB — MAGNESIUM: Magnesium: 2 mg/dL (ref 1.7–2.4)

## 2020-04-04 LAB — LACTIC ACID, PLASMA
Lactic Acid, Venous: 1.8 mmol/L (ref 0.5–1.9)
Lactic Acid, Venous: 2 mmol/L (ref 0.5–1.9)

## 2020-04-04 LAB — AMMONIA: Ammonia: 30 umol/L (ref 9–35)

## 2020-04-04 MED ORDER — MAGNESIUM SULFATE 4 GM/100ML IV SOLN
4.0000 g | Freq: Once | INTRAVENOUS | Status: DC
  Filled 2020-04-04: qty 100

## 2020-04-04 MED ORDER — HYDROXYZINE HCL 50 MG/ML IM SOLN
50.0000 mg | Freq: Once | INTRAMUSCULAR | Status: AC
  Administered 2020-04-04: 50 mg via INTRAMUSCULAR
  Filled 2020-04-04: qty 1

## 2020-04-04 MED ORDER — POTASSIUM CHLORIDE CRYS ER 20 MEQ PO TBCR
60.0000 meq | EXTENDED_RELEASE_TABLET | Freq: Two times a day (BID) | ORAL | Status: AC
  Administered 2020-04-04 (×2): 60 meq via ORAL
  Filled 2020-04-04 (×2): qty 3

## 2020-04-04 MED ORDER — BENZONATATE 100 MG PO CAPS
200.0000 mg | ORAL_CAPSULE | Freq: Three times a day (TID) | ORAL | Status: DC | PRN
  Administered 2020-04-04: 200 mg via ORAL
  Filled 2020-04-04: qty 2

## 2020-04-04 NOTE — Progress Notes (Signed)
PROGRESS NOTE    Mathew [name]  ZOX:[id number] DOB: Feb 13, 1980 DOA: 04/02/2020 PCP: Storm FriskWright, Patrick E, MD    Brief Narrative:  40 y.o. male with medical history significant of alcohol use, alcoholic liver cirrhosis, history of withdrawal, history of anemia, and hypoalbuminemia who presents with weakness, lethargy, confusion. Patient was admitted for concerns of pneumonia with sepsis  Assessment & Plan:   Principal Problem:   Severe sepsis (HCC) Active Problems:   ETOH abuse   Thrombocytopenia (HCC)   Alcoholic cirrhosis of liver without ascites (HCC)   Pneumonia  Severe sepsis with Pneumonia present on admit > Patient meets criteria for sepsis with tachycardia in the 100s, tachypnea in the 20s, leukocytosis to 11.3, fever to 102.  Severe sepsis considering lactic acid elevated at 5.1. > Presumed source is his right upper lobe pneumonia. > Started on broad-spectrum antibiotics in the ED and has received 1.5 L of fluids. > Blood pressure stable in ED - Continued on IV fluids - Pt had been continued on empiric azithromycin and rocephin - Lactic acid has trended down - repeat cmp and cbc in AM  Alcoholic cirrhosis > Patient has known alcoholic cirrhosis from chronic alcohol use. > He is try to cut back his last drink was 2 weeks prior to admit > Alcohol negative in the ED > Pt noted to be thrombocytopenic, see below > Meld score 24 at time of presentation - Lasix and spironolactone were held in the setting of severe sepsis - Continue thiamine and folate - RUQ reviewed, findings consistent with known hx of cirrhosis -Ammonia peaked to 57, now trended to 30 today with lactulose  Hepatic encephalopathy -staff reports periods of lethargy -ammonia peaked to 57, improved to 30 today -lactulose per above  Thrombocytopenia -Patient has baseline thrombocytopenia -likely worsened secondary to presenting sepsis -Avoid anticoagulation/antiplatelet unless necessary -Platelets  are now improving -Repeat CBC in AM  Hx ETOH abuse -Reportedly last ETOH intake was 2 weeks prior to visit  DVT prophylaxis: SCD's Code Status: Full Family Communication: Pt in room, family not at bedside  Status is: Inpatient  Remains inpatient appropriate because:IV treatments appropriate due to intensity of illness or inability to take PO and Inpatient level of care appropriate due to severity of illness   Dispo: The patient is from: Home              Anticipated d/c is to: Home              Patient currently is not medically stable to d/c.   Difficult to place patient No   Consultants:     Procedures:     Antimicrobials: Anti-infectives (From admission, onward)   Start     Dose/Rate Route Frequency Ordered Stop   04/03/20 0900  cefTRIAXone (ROCEPHIN) 2 g in sodium chloride 0.9 % 100 mL IVPB        2 g 200 mL/hr over 30 Minutes Intravenous Every 24 hours 04/03/20 0809 04/07/20 1259   04/03/20 0900  azithromycin (ZITHROMAX) 500 mg in sodium chloride 0.9 % 250 mL IVPB        500 mg 250 mL/hr over 60 Minutes Intravenous Every 24 hours 04/03/20 0809 04/08/20 0859   04/02/20 2030  ceFEPIme (MAXIPIME) 2 g in sodium chloride 0.9 % 100 mL IVPB        2 g 200 mL/hr over 30 Minutes Intravenous  Once 04/02/20 2026 04/02/20 2139   04/02/20 2030  metroNIDAZOLE (FLAGYL) IVPB 500 mg  500 mg 100 mL/hr over 60 Minutes Intravenous  Once 04/02/20 2026 04/02/20 2152      Subjective: Reports feeling better. Eager to go home soon  Objective: Vitals:   04/03/20 2207 04/04/20 0431 04/04/20 0437 04/04/20 1504  BP: 107/69  111/85 123/71  Pulse: 91  86 95  Resp: 20  20 (!) 22  Temp: 98.5 F (36.9 C)  99.2 F (37.3 C) 99 F (37.2 C)  TempSrc: Oral  Oral Oral  SpO2: 95%  97% 99%  Weight:  77.4 kg    Height:        Intake/Output Summary (Last 24 hours) at 04/04/2020 1638 Last data filed at 04/04/2020 1525 Gross per 24 hour  Intake 560 ml  Output 1101 ml  Net -541 ml    Filed Weights   04/03/20 0329 04/04/20 0431  Weight: 79.4 kg 77.4 kg    Examination: General exam: Awake, laying in bed, in nad Respiratory system: Normal respiratory effort, no wheezing Cardiovascular system: regular rate, s1, s2 Gastrointestinal system: Soft, nondistended, positive BS Central nervous system: CN2-12 grossly intact, strength intact Extremities: Perfused, no clubbing Skin: Normal skin turgor, no notable skin lesions seen Psychiatry: Mood normal // no visual hallucinations   Data Reviewed: I have personally reviewed following labs and imaging studies  CBC: Recent Labs  Lab 03/31/20 0905 04/02/20 2026 04/03/20 0009 04/03/20 0330 04/04/20 1050  WBC 3.3* 11.3* 11.5* 11.7* 5.5  NEUTROABS 1.8 10.5*  --   --   --   HGB 9.0* 8.4* 8.0* 7.9* 9.0*  HCT 28.3* 26.9* 25.8* 25.3* 29.4*  MCV 84 87.9 88.1 88.2 87.5  PLT 44* 36* 25* 24* 32*   Basic Metabolic Panel: Recent Labs  Lab 03/31/20 0905 04/02/20 2026 04/03/20 0009 04/03/20 0330 04/04/20 0432 04/04/20 1050  NA 138 130*  --  133* 131*  --   K 4.0 4.4  --  2.6* 2.8*  --   CL 104 107  --  108 107  --   CO2 21 15*  --  18* 20*  --   GLUCOSE 112* 100*  --  92 92  --   BUN 7 18  --  16 13  --   CREATININE 0.57* 0.63  --  0.71 0.50*  --   CALCIUM 7.8* 7.3*  --  7.4* 7.4*  --   MG  --   --  1.3*  --   --  2.0  PHOS  --   --  2.0*  --   --   --    GFR: Estimated Creatinine Clearance: 116.7 mL/min (A) (by C-G formula based on SCr of 0.5 mg/dL (L)). Liver Function Tests: Recent Labs  Lab 03/31/20 0905 04/02/20 2026 04/03/20 0330 04/04/20 0432  AST 62* 111* 61* 80*  ALT 21 26 19 28   ALKPHOS 196* 58 51 49  BILITOT 1.6* 4.0* 3.5* 2.8*  PROT 7.5 6.1* 5.7* 5.3*  ALBUMIN 2.7* 2.0* 1.8* 1.7*   No results for input(s): LIPASE, AMYLASE in the last 168 hours. Recent Labs  Lab 04/02/20 2200 04/04/20 1250  AMMONIA 57* 30   Coagulation Profile: Recent Labs  Lab 03/31/20 0905 04/02/20 2026  04/03/20 0330  INR 1.4* 2.9* 3.0*   Cardiac Enzymes: No results for input(s): CKTOTAL, CKMB, CKMBINDEX, TROPONINI in the last 168 hours. BNP (last 3 results) No results for input(s): PROBNP in the last 8760 hours. HbA1C: No results for input(s): HGBA1C in the last 72 hours. CBG: No results for input(s): GLUCAP  in the last 168 hours. Lipid Profile: No results for input(s): CHOL, HDL, LDLCALC, TRIG, CHOLHDL, LDLDIRECT in the last 72 hours. Thyroid Function Tests: No results for input(s): TSH, T4TOTAL, FREET4, T3FREE, THYROIDAB in the last 72 hours. Anemia Panel: No results for input(s): VITAMINB12, FOLATE, FERRITIN, TIBC, IRON, RETICCTPCT in the last 72 hours. Sepsis Labs: Recent Labs  Lab 04/03/20 0009 04/03/20 0330 04/04/20 1050 04/04/20 1250  PROCALCITON  --  5.64  --   --   LATICACIDVEN 5.5* 4.4* 1.8 2.0*    Recent Results (from the past 240 hour(s))  Blood Culture (routine x 2)     Status: Abnormal (Preliminary result)   Collection Time: 04/02/20  8:26 PM   Specimen: BLOOD  Result Value Ref Range Status   Specimen Description   Final    BLOOD RIGHT ARM Performed at [facility], [address], [place], [place] [address]    Special Requests   Final    BOTTLES DRAWN AEROBIC AND ANAEROBIC Blood Culture adequate volume Performed at [facility], [address] [place], [place] [address]    Culture  Setup Time   Final    GRAM POSITIVE COCCI IN CLUSTERS IN BOTH AEROBIC AND ANAEROBIC BOTTLES Organism ID to follow CRITICAL RESULT CALLED TO, READ BACK BY AND VERIFIED WITH: Janine Limbo PHARMD [id number] 04/03/20 A BROWNING    Culture (A)  Final    STAPHYLOCOCCUS EPIDERMIDIS THE SIGNIFICANCE OF ISOLATING THIS ORGANISM FROM A SINGLE SET OF BLOOD CULTURES WHEN MULTIPLE SETS ARE DRAWN IS UNCERTAIN. PLEASE NOTIFY THE MICROBIOLOGY DEPARTMENT WITHIN ONE WEEK IF SPECIATION AND SENSITIVITIES ARE REQUIRED. Performed at [facility], [address], [place], [place] [address]    Report Status PENDING  Incomplete  Blood Culture ID Panel (Reflexed)     Status: Abnormal   Collection Time: 04/02/20  8:26 PM  Result Value Ref Range Status   Enterococcus faecalis NOT DETECTED NOT DETECTED Final   Enterococcus Faecium NOT DETECTED NOT DETECTED Final   Listeria monocytogenes NOT DETECTED NOT DETECTED Final   Staphylococcus species DETECTED (A) NOT DETECTED Final    Comment: CRITICAL RESULT CALLED TO, READ BACK BY AND VERIFIED WITH: Janine Limbo PHARMD [id number] 04/03/20 A BROWNING    Staphylococcus aureus (BCID) NOT DETECTED NOT DETECTED Final   Staphylococcus epidermidis DETECTED (A) NOT DETECTED Final    Comment: CRITICAL RESULT CALLED TO, READ BACK BY AND VERIFIED WITH: Janine Limbo PHARMD [id number] 04/03/20 A BROWNING    Staphylococcus lugdunensis NOT DETECTED NOT DETECTED Final   Streptococcus species NOT DETECTED NOT DETECTED Final   Streptococcus agalactiae NOT DETECTED NOT DETECTED Final   Streptococcus pneumoniae NOT DETECTED NOT DETECTED Final   Streptococcus pyogenes NOT DETECTED NOT DETECTED Final   A.calcoaceticus-baumannii NOT DETECTED NOT DETECTED Final   Bacteroides fragilis NOT DETECTED NOT DETECTED Final   Enterobacterales NOT DETECTED NOT DETECTED Final   Enterobacter cloacae complex NOT DETECTED NOT DETECTED Final   Escherichia coli NOT DETECTED NOT DETECTED Final   Klebsiella aerogenes NOT DETECTED NOT DETECTED Final   Klebsiella oxytoca NOT DETECTED NOT DETECTED Final   Klebsiella pneumoniae NOT DETECTED NOT DETECTED Final   Proteus species NOT DETECTED NOT DETECTED Final   Salmonella species NOT DETECTED NOT DETECTED Final   Serratia marcescens NOT DETECTED NOT DETECTED Final   Haemophilus influenzae NOT DETECTED NOT DETECTED Final   Neisseria meningitidis NOT DETECTED NOT DETECTED Final   Pseudomonas aeruginosa NOT DETECTED NOT DETECTED Final   Stenotrophomonas  maltophilia NOT DETECTED NOT DETECTED Final   Candida albicans  NOT DETECTED NOT DETECTED Final   Candida auris NOT DETECTED NOT DETECTED Final   Candida glabrata NOT DETECTED NOT DETECTED Final   Candida krusei NOT DETECTED NOT DETECTED Final   Candida parapsilosis NOT DETECTED NOT DETECTED Final   Candida tropicalis NOT DETECTED NOT DETECTED Final   Cryptococcus neoformans/gattii NOT DETECTED NOT DETECTED Final   Methicillin resistance mecA/C NOT DETECTED NOT DETECTED Final    Comment: Performed at [facility], [address] [place], [place] [address]  Resp Panel by RT-PCR (Flu A&B, Covid) Nasopharyngeal Swab     Status: None   Collection Time: 04/02/20  9:45 PM   Specimen: Nasopharyngeal Swab; Nasopharyngeal(NP) swabs in vial transport medium  Result Value Ref Range Status   SARS Coronavirus 2 by RT PCR NEGATIVE NEGATIVE Final    Comment: (NOTE) SARS-CoV-2 target nucleic acids are NOT DETECTED.  The SARS-CoV-2 RNA is generally detectable in upper respiratory specimens during the acute phase of infection. The lowest concentration of SARS-CoV-2 viral copies this assay can detect is 138 copies/mL. A negative result does not preclude SARS-Cov-2 infection and should not be used as the sole basis for treatment or other patient management decisions. A negative result may occur with  improper specimen collection/handling, submission of specimen other than nasopharyngeal swab, presence of viral mutation(s) within the areas targeted by this assay, and inadequate number of viral copies(<138 copies/mL). A negative result must be combined with clinical observations, patient history, and epidemiological information. The expected result is Negative.  Fact Sheet for Patients:  BloggerCourse.com  Fact Sheet for Healthcare Providers:  SeriousBroker.it  This test is no t yet approved or cleared by the [place] FDA and  has been authorized for detection and/or diagnosis of SARS-CoV-2 by FDA under  an Emergency Use Authorization (EUA). This EUA will remain  in effect (meaning this test can be used) for the duration of the COVID-19 declaration under Section 564(b)(1) of the Act, 21 U.S.C.section 360bbb-3(b)(1), unless the authorization is terminated  or revoked sooner.       Influenza A by PCR NEGATIVE NEGATIVE Final   Influenza B by PCR NEGATIVE NEGATIVE Final    Comment: (NOTE) The Xpert Xpress SARS-CoV-2/FLU/RSV plus assay is intended as an aid in the diagnosis of influenza from Nasopharyngeal swab specimens and should not be used as a sole basis for treatment. Nasal washings and aspirates are unacceptable for Xpert Xpress SARS-CoV-2/FLU/RSV testing.  Fact Sheet for Patients: BloggerCourse.com  Fact Sheet for Healthcare Providers: SeriousBroker.it  This test is not yet approved or cleared by the [place] FDA and has been authorized for detection and/or diagnosis of SARS-CoV-2 by FDA under an Emergency Use Authorization (EUA). This EUA will remain in effect (meaning this test can be used) for the duration of the COVID-19 declaration under Section 564(b)(1) of the Act, 21 U.S.C. section 360bbb-3(b)(1), unless the authorization is terminated or revoked.  Performed at [facility], [address], [place], [place] [address]   Blood Culture (routine x 2)     Status: None (Preliminary result)   Collection Time: 04/02/20  9:50 PM   Specimen: BLOOD  Result Value Ref Range Status   Specimen Description   Final    BLOOD LEFT ARM Performed at [facility], [address], [place], [place] [address]    Special Requests   Final    BOTTLES DRAWN AEROBIC AND ANAEROBIC Blood Culture results  may not be optimal due to an inadequate volume of blood received in culture bottles Performed at [facility], [address], [place], [place] [address]    Culture   Final    NO  GROWTH 1 DAY Performed at [facility], [address], [place], [place] [address]    Report Status PENDING  Incomplete     Radiology Studies: DG Abdomen 1 View  Result Date: 04/02/2020 CLINICAL DATA:  Generalized weakness and lethargy for 2 days, vomiting EXAM: ABDOMEN - 1 VIEW COMPARISON:  None. FINDINGS: Two supine frontal views of the abdomen and pelvis are obtained. Moderate gaseous distention of the colon. No evidence of small-bowel obstruction or ileus. No masses or abnormal calcifications. No acute bony abnormalities. IMPRESSION: 1. No evidence of bowel obstruction or ileus. Moderate gas distention of the colon. Electronically Signed   By: Sharlet Salina M.D.   On: 04/02/2020 21:13   DG Chest Port 1 View  Result Date: 04/02/2020 CLINICAL DATA:  Weakness, lethargy for 2 days, emesis EXAM: PORTABLE CHEST 1 VIEW COMPARISON:  09/03/2019 FINDINGS: Single frontal view of the chest demonstrates an unremarkable cardiac silhouette. Increased density right upper hemithorax consistent with underlying airspace disease and/or loculated effusion. No effusion or pneumothorax. No acute bony abnormalities. IMPRESSION: Right upper lobe consolidation and/or loculated effusion. Findings are consistent with underlying pneumonia. Electronically Signed   By: Sharlet Salina M.D.   On: 04/02/2020 21:16   US Abdomen Limited RUQ (LIVER/GB)  Result Date: 04/03/2020 CLINICAL DATA:  Elevated liver function tests. EXAM: ULTRASOUND ABDOMEN LIMITED RIGHT UPPER QUADRANT COMPARISON:  March 14, 2018. FINDINGS: Gallbladder: No cholelithiasis is noted. Mild to moderate gallbladder wall thickening is noted at approximately 5 mm. No sonographic Murphy's sign is noted. No pericholecystic fluid is noted. Common bile duct: Diameter: 6 mm which is within normal limits. Liver: No focal lesion identified. Increased echogenicity of hepatic parenchyma is noted with slightly nodular hepatic contours suggesting possible hepatic  cirrhosis. Portal vein is patent on color Doppler imaging with normal direction of blood flow towards the liver. Other: None. IMPRESSION: Increased echogenicity of hepatic parenchyma is noted with slightly nodular hepatic contours suggesting of hepatic cirrhosis. No definite focal sonographic hepatic abnormality is noted. Mild to moderate gallbladder wall thickening is noted which most likely is due to adjacent hepatocellular disease. No definite cholelithiasis is noted. Electronically Signed   By: Lupita Raider M.D.   On: 04/03/2020 12:49    Scheduled Meds: . folic acid  1 mg Oral Daily  . lactulose  20 g Oral BID  . pantoprazole  40 mg Oral Daily  . potassium chloride  60 mEq Oral BID  . sodium chloride flush  3 mL Intravenous Q12H  . thiamine  100 mg Oral Daily   Continuous Infusions: . azithromycin 500 mg (04/04/20 1102)  . cefTRIAXone (ROCEPHIN)  IV 2 g (04/04/20 1415)     LOS: 2 days   Rickey Barbara, MD [facility] Pager On Amion  If 7PM-7AM, please contact night-coverage 04/04/2020, 4:38 PM

## 2020-04-04 NOTE — Progress Notes (Incomplete)
Patient is very agitated. Putting on his jeans and trying to put on his shoes.The patient's nurse notified the CN,PCP and security.  The patient

## 2020-04-04 NOTE — Progress Notes (Signed)
   04/04/20 2349  CIWA-Ar  Nausea and Vomiting 4  Tactile Disturbances 2  Tremor 4  Auditory Disturbances 0  Paroxysmal Sweats 1  Visual Disturbances 0  Anxiety 4  Headache, Fullness in Head 0  Agitation 4  Orientation and Clouding of Sensorium 2  CIWA-Ar Total 21

## 2020-04-04 NOTE — Evaluation (Signed)
Physical Therapy Evaluation Patient Details Name: Stepehn Eckard MRN: [id number] DOB: Aug 01, 1980 Today's Date: 04/04/2020   History of Present Illness  40 yo male admitted with severe sepsis, Pna. Hx of ETOH abuse, cirrhosis  Clinical Impression  On eval, pt required Min assist for mobility. He walked ~25 feet around the room. Coughing spells once mobilizing. He was concerned about his modesty (asking for clothes, shoes, etc) even though he was wearing 2 gowns (front and back). He appeared a bit confused even with Engineer, structural assisting with communication. Towards end of session, pt began to speak a bit of English and seemed to understand a little as well. Assisted back to bed at pt's request and for his safety. No family present during session. Will follow.     Follow Up Recommendations Supervision for mobility/OOB    Equipment Recommendations  None recommended by PT    Recommendations for Other Services       Precautions / Restrictions Precautions Precautions: Fall Restrictions Weight Bearing Restrictions: No      Mobility  Bed Mobility Overal bed mobility: Needs Assistance Bed Mobility: Supine to Sit;Sit to Supine     Supine to sit: Supervision Sit to supine: Supervision   General bed mobility comments: for safety, lines    Transfers Overall transfer level: Needs assistance Equipment used: None Transfers: Sit to/from Stand Sit to Stand: Min assist         General transfer comment: Assist to steady.  Ambulation/Gait Ambulation/Gait assistance: Min assist Gait Distance (Feet): 25 Feet Assistive device: None Gait Pattern/deviations: Step-through pattern;Decreased stride length     General Gait Details: Assist to steady.  Stairs            Wheelchair Mobility    Modified Rankin (Stroke Patients Only)       Balance Overall balance assessment: Needs assistance           Standing balance-Leahy Scale: Poor                                Pertinent Vitals/Pain Pain Assessment: Faces Faces Pain Scale: No hurt    Home Living Family/patient expects to be discharged to:: Private residence Living Arrangements: Other (Comment)                    Prior Function           Comments: unsure-likely independent. pt denied using rw or cane. unreliable historian 2* some confusion and language barrier     Hand Dominance        Extremity/Trunk Assessment   Upper Extremity Assessment Upper Extremity Assessment: Defer to OT evaluation    Lower Extremity Assessment Lower Extremity Assessment: Generalized weakness    Cervical / Trunk Assessment Cervical / Trunk Assessment: Normal  Communication   Communication: Prefers language other than English  Cognition Arousal/Alertness: Awake/alert Behavior During Therapy: WFL for tasks assessed/performed Overall Cognitive Status: Difficult to assess                                 General Comments: speaks a little [place]. some confusion      General Comments      Exercises     Assessment/Plan    PT Assessment Patient needs continued PT services  PT Problem List Decreased mobility;Decreased strength;Decreased activity tolerance;Decreased balance;Decreased cognition;Decreased safety awareness  PT Treatment Interventions Gait training;Therapeutic exercise;Balance training;Functional mobility training;Therapeutic activities;Patient/family education    PT Goals (Current goals can be found in the Care Plan section)  Acute Rehab PT Goals Patient Stated Goal: none stated PT Goal Formulation: With patient Time For Goal Achievement: 04/18/20 Potential to Achieve Goals: Good    Frequency Min 3X/week   Barriers to discharge        Co-evaluation               AM-PAC PT "6 Clicks" Mobility  Outcome Measure Help needed turning from your back to your side while in a flat bed without using bedrails?: A Little Help  needed moving from lying on your back to sitting on the side of a flat bed without using bedrails?: A Little Help needed moving to and from a bed to a chair (including a wheelchair)?: A Little Help needed standing up from a chair using your arms (e.g., wheelchair or bedside chair)?: A Little Help needed to walk in hospital room?: A Little Help needed climbing 3-5 steps with a railing? : A Little 6 Click Score: 18    End of Session   Activity Tolerance: Patient tolerated treatment well Patient left: in bed;with call bell/phone within reach;with bed alarm set   PT Visit Diagnosis: Unsteadiness on feet (R26.81);Muscle weakness (generalized) (M62.81)    Time: 5809-9833 PT Time Calculation (min) (ACUTE ONLY): 24 min   Charges:   PT Evaluation $PT Eval Low Complexity: 1 Low PT Treatments $Gait Training: 8-22 mins          Faye Ramsay, PT Acute Rehabilitation  Office: [phone] Pager: [phone]

## 2020-04-04 NOTE — Progress Notes (Signed)
Patient is very agitated. Putting on his jeans and trying to put on his shoes.The patient's nurse notified the CN,PCP and security.  The patient , through the use of the interpretor, was reminded to rest in bed until the morning.  The patient still continued to try to get dressed and leave the hospital. PCP was notified again.

## 2020-04-05 LAB — COMPREHENSIVE METABOLIC PANEL
ALT: 27 U/L (ref 0–44)
ALT: 27 U/L (ref 0–44)
AST: 71 U/L — ABNORMAL HIGH (ref 15–41)
AST: 72 U/L — ABNORMAL HIGH (ref 15–41)
Albumin: 1.7 g/dL — ABNORMAL LOW (ref 3.5–5.0)
Albumin: 1.8 g/dL — ABNORMAL LOW (ref 3.5–5.0)
Alkaline Phosphatase: 59 U/L (ref 38–126)
Alkaline Phosphatase: 57 U/L (ref 38–126)
Anion gap: 5 (ref 5–15)
Anion gap: 6 (ref 5–15)
BUN: 13 mg/dL (ref 6–20)
BUN: 13 mg/dL (ref 6–20)
CO2: 18 mmol/L — ABNORMAL LOW (ref 22–32)
CO2: 16 mmol/L — ABNORMAL LOW (ref 22–32)
Calcium: 7.5 mg/dL — ABNORMAL LOW (ref 8.9–10.3)
Calcium: 7.7 mg/dL — ABNORMAL LOW (ref 8.9–10.3)
Chloride: 111 mmol/L (ref 98–111)
Chloride: 111 mmol/L (ref 98–111)
Creatinine, Ser: 0.54 mg/dL — ABNORMAL LOW (ref 0.61–1.24)
Creatinine, Ser: 0.53 mg/dL — ABNORMAL LOW (ref 0.61–1.24)
GFR, Estimated: >60 mL/min (ref >60)
GFR, Estimated: >60 mL/min (ref >60)
Glucose, Bld: 79 mg/dL (ref 70–99)
Glucose, Bld: 78 mg/dL (ref 70–99)
Potassium: 3.2 mmol/L — ABNORMAL LOW (ref 3.5–5.1)
Potassium: 3.1 mmol/L — ABNORMAL LOW (ref 3.5–5.1)
Sodium: 134 mmol/L — ABNORMAL LOW (ref 135–145)
Sodium: 133 mmol/L — ABNORMAL LOW (ref 135–145)
Total Bilirubin: 3 mg/dL — ABNORMAL HIGH (ref 0.3–1.2)
Total Bilirubin: 3.3 mg/dL — ABNORMAL HIGH (ref 0.3–1.2)
Total Protein: 5.3 g/dL — ABNORMAL LOW (ref 6.5–8.1)
Total Protein: 5.6 g/dL — ABNORMAL LOW (ref 6.5–8.1)

## 2020-04-05 LAB — CBC
HCT: 24 % — ABNORMAL LOW (ref 39.0–52.0)
HCT: 24.1 % — ABNORMAL LOW (ref 39.0–52.0)
Hemoglobin: 7.5 g/dL — ABNORMAL LOW (ref 13.0–17.0)
Hemoglobin: 7.5 g/dL — ABNORMAL LOW (ref 13.0–17.0)
MCH: 27.2 pg (ref 26.0–34.0)
MCH: 27.6 pg (ref 26.0–34.0)
MCHC: 31.3 g/dL (ref 30.0–36.0)
MCHC: 31.1 g/dL (ref 30.0–36.0)
MCV: 87 fL (ref 80.0–100.0)
MCV: 88.6 fL (ref 80.0–100.0)
Platelets: 32 10*3/uL — ABNORMAL LOW (ref 150–400)
Platelets: 35 10*3/uL — ABNORMAL LOW (ref 150–400)
RBC: 2.76 MIL/uL — ABNORMAL LOW (ref 4.22–5.81)
RBC: 2.72 MIL/uL — ABNORMAL LOW (ref 4.22–5.81)
RDW: 17.2 % — ABNORMAL HIGH (ref 11.5–15.5)
RDW: 17 % — ABNORMAL HIGH (ref 11.5–15.5)
WBC: 3.1 10*3/uL — ABNORMAL LOW (ref 4.0–10.5)
WBC: 3 10*3/uL — ABNORMAL LOW (ref 4.0–10.5)
nRBC: 1 % — ABNORMAL HIGH (ref 0.0–0.2)
nRBC: 1 % — ABNORMAL HIGH (ref 0.0–0.2)

## 2020-04-05 LAB — CULTURE, BLOOD (ROUTINE X 2): Special Requests: ADEQUATE

## 2020-04-05 LAB — MAGNESIUM
Magnesium: 1.9 mg/dL (ref 1.7–2.4)
Magnesium: 1.9 mg/dL (ref 1.7–2.4)

## 2020-04-05 LAB — PHOSPHORUS: Phosphorus: 2.2 mg/dL — ABNORMAL LOW (ref 2.5–4.6)

## 2020-04-05 LAB — AMMONIA: Ammonia: 49 umol/L — ABNORMAL HIGH (ref 9–35)

## 2020-04-05 MED ORDER — LORAZEPAM 2 MG/ML IJ SOLN
1.0000 mg | INTRAMUSCULAR | Status: DC | PRN
Start: 2020-04-05 — End: 2020-04-07

## 2020-04-05 MED ORDER — LORAZEPAM 1 MG PO TABS: 1.0000 mg | ORAL_TABLET | ORAL | Status: DC | PRN

## 2020-04-05 MED ORDER — FOLIC ACID 1 MG PO TABS
1.0000 mg | ORAL_TABLET | Freq: Every day | ORAL | Status: DC
  Administered 2020-04-05 – 2020-04-07 (×3): 1 mg via ORAL
  Filled 2020-04-05 (×3): qty 1

## 2020-04-05 MED ORDER — LORAZEPAM 2 MG/ML IJ SOLN: 0.0000 mg | Freq: Two times a day (BID) | INTRAMUSCULAR | Status: DC

## 2020-04-05 MED ORDER — POTASSIUM CHLORIDE CRYS ER 20 MEQ PO TBCR
40.0000 meq | EXTENDED_RELEASE_TABLET | Freq: Two times a day (BID) | ORAL | Status: AC
  Administered 2020-04-05 (×2): 40 meq via ORAL
  Filled 2020-04-05 (×2): qty 2

## 2020-04-05 MED ORDER — THIAMINE HCL 100 MG/ML IJ SOLN
100.0000 mg | Freq: Every day | INTRAMUSCULAR | Status: DC
  Filled 2020-04-05: qty 2

## 2020-04-05 MED ORDER — LORAZEPAM 2 MG/ML IJ SOLN
0.5000 mg | Freq: Once | INTRAMUSCULAR | Status: AC
  Administered 2020-04-05: 0.5 mg via INTRAVENOUS
  Filled 2020-04-05: qty 1

## 2020-04-05 MED ORDER — LORAZEPAM 2 MG/ML IJ SOLN
0.0000 mg | Freq: Four times a day (QID) | INTRAMUSCULAR | Status: AC
  Administered 2020-04-05: 1 mg via INTRAVENOUS
  Filled 2020-04-05: qty 1

## 2020-04-05 MED ORDER — THIAMINE HCL 100 MG PO TABS
100.0000 mg | ORAL_TABLET | Freq: Every day | ORAL | Status: DC
  Administered 2020-04-05 – 2020-04-07 (×3): 100 mg via ORAL
  Filled 2020-04-05 (×3): qty 1

## 2020-04-05 MED ORDER — ADULT MULTIVITAMIN W/MINERALS CH
1.0000 | ORAL_TABLET | Freq: Every day | ORAL | Status: DC
  Administered 2020-04-05 – 2020-04-07 (×3): 1 via ORAL
  Filled 2020-04-05 (×3): qty 1

## 2020-04-05 NOTE — Progress Notes (Signed)
OT Cancellation Note  Patient Details Name: Mathew [name] MRN: [id number] DOB: 1980-04-03   Cancelled Treatment:    Reason Eval/Treat Not Completed: Medical issues which prohibited therapy. Patient confused, unable to follow commands, just received Ativan and too unsafe to mobilize at this time. Will f/u as able.  Stylianos Stradling L Catherine [place] 04/05/2020, 2:49 PM

## 2020-04-05 NOTE — Progress Notes (Signed)
   04/04/20 2349  CIWA-Ar  BP 124/69  Pulse Rate 99  Nausea and Vomiting 4  Tactile Disturbances 2  Tremor 4  Auditory Disturbances 0  Paroxysmal Sweats 1  Visual Disturbances 0  Anxiety 4  Headache, Fullness in Head 0  Agitation 4  Orientation and Clouding of Sensorium 2  CIWA-Ar Total 21   Patient is very confused and unable to follow directions. He has been trying to get dressed and "go to work". Moderate tremors visible on BUE. He was observed dry heaving and vomited once. Vistaril IV was not effective. Patient agitation and anxiety is escalating. [place], APP notified .

## 2020-04-05 NOTE — Progress Notes (Signed)
PROGRESS NOTE    Mathew CharonJuan M [name] [name]  UKG:[id number] DOB: 1980/04/18 DOA: 04/02/2020 PCP: Storm FriskWright, Patrick E, MD    Brief Narrative:  40 y.o. male with medical history significant of alcohol use, alcoholic liver cirrhosis, history of withdrawal, history of anemia, and hypoalbuminemia who presents with weakness, lethargy, confusion. Patient was admitted for concerns of pneumonia with sepsis  Assessment & Plan:   Principal Problem:   Severe sepsis (HCC) Active Problems:   ETOH abuse   Thrombocytopenia (HCC)   Alcoholic cirrhosis of liver without ascites (HCC)   Pneumonia  Severe sepsis with Pneumonia present on admit > Patient meets criteria for sepsis with tachycardia in the 100s, tachypnea in the 20s, leukocytosis to 11.3, fever to 102.  Severe sepsis considering lactic acid elevated at 5.1. > Presumed source is his right upper lobe pneumonia. > Started on broad-spectrum antibiotics in the ED and has received 1.5 L of fluids. > Blood pressure stable in ED - Continued on IV fluids - Pt had been continued on empiric azithromycin and rocephin - lactate had trended down - recheck cmp and CBC in AM  Alcoholic cirrhosis > Patient has known alcoholic cirrhosis from chronic alcohol use. > Alcohol level was negative in the ED > Pt noted to be thrombocytopenic, see below > Meld score 24 at time of presentation - Lasix and spironolactone were held in the setting of severe sepsis - Continue thiamine and folate - RUQ reviewed, findings consistent with known hx of cirrhosis -Ammonia has normalized after receiving lactulose  Hepatic encephalopathy -staff reports periods of lethargy -ammonia peaked to 57, resolved after receiving lactulose  Thrombocytopenia -Patient has baseline thrombocytopenia -likely worsened secondary to presenting sepsis -Avoid anticoagulation/antiplatelet unless necessary -Platelets are slowly trending up -Repeat CBC in AM  Hx ETOH abuse with acute ETOH  withdrawals  -Initially believed last ETOH was around 2 weeks prior to admit -Discussed with family at bedside who is now not sure about pt's ETOH hisotry -ETOH level at presentation is neg -Now clinically in withdrawals with CIWA peaking to 21 overnight, now improved with ativan per CIWA protocol -Head CT was ordered given acute mental status change, reviewed and was neg -Cont CIWA. Will need cessation -Of note, chart reviewed and pt has a long hx of ETOH related issues in the past including ETOH related seizures as well as treatment by behavioral health for ETOH problems  Acute toxic metabolic encephalopathy  DVT prophylaxis: SCD's Code Status: Full Family Communication: Pt in room, family is currently at bedside  Status is: Inpatient  Remains inpatient appropriate because:IV treatments appropriate due to intensity of illness or inability to take PO and Inpatient level of care appropriate due to severity of illness   Dispo: The patient is from: Home              Anticipated d/c is to: Home              Patient currently is not medically stable to d/c.   Difficult to place patient No   Consultants:     Procedures:     Antimicrobials: Anti-infectives (From admission, onward)   Start     Dose/Rate Route Frequency Ordered Stop   04/03/20 0900  cefTRIAXone (ROCEPHIN) 2 g in sodium chloride 0.9 % 100 mL IVPB        2 g 200 mL/hr over 30 Minutes Intravenous Every 24 hours 04/03/20 0809 04/07/20 1259   04/03/20 0900  azithromycin (ZITHROMAX) 500 mg in sodium chloride 0.9 %  250 mL IVPB        500 mg 250 mL/hr over 60 Minutes Intravenous Every 24 hours 04/03/20 0809 04/08/20 0859   04/02/20 2030  ceFEPIme (MAXIPIME) 2 g in sodium chloride 0.9 % 100 mL IVPB        2 g 200 mL/hr over 30 Minutes Intravenous  Once 04/02/20 2026 04/02/20 2139   04/02/20 2030  metroNIDAZOLE (FLAGYL) IVPB 500 mg        500 mg 100 mL/hr over 60 Minutes Intravenous  Once 04/02/20 2026 04/02/20 2152       Subjective: Confused this AM  Objective: Vitals:   04/04/20 2054 04/04/20 2349 04/05/20 0149 04/05/20 1254  BP: 118/70 124/69  106/64  Pulse: 86 99 89 95  Resp: (!) 22 20  (!) 24  Temp: 98.7 F (37.1 C)   100.1 F (37.8 C)  TempSrc: Oral   Oral  SpO2: 100% 100%  97%  Weight:      Height:        Intake/Output Summary (Last 24 hours) at 04/05/2020 1530 Last data filed at 04/05/2020 1150 Gross per 24 hour  Intake 477 ml  Output 602 ml  Net -125 ml   Filed Weights   04/03/20 0329 04/04/20 0431  Weight: 79.4 kg 77.4 kg    Examination: General exam: Conversant, in no acute distress Respiratory system: normal chest rise, clear, no audible wheezing Cardiovascular system: regular rhythm, s1-s2 Gastrointestinal system: Nondistended, nontender, pos BS Central nervous system: No seizures, no mild tremors Extremities: No cyanosis, no joint deformities Skin: No rashes, no pallor Psychiatry: difficult to assess, confused  Data Reviewed: I have personally reviewed following labs and imaging studies  CBC: Recent Labs  Lab 03/31/20 0905 04/02/20 2026 04/02/20 2026 04/03/20 0009 04/03/20 0330 04/04/20 1050 04/05/20 0413 04/05/20 0828  WBC 3.3* 11.3*   < > 11.5* 11.7* 5.5 3.1* 3.0*  NEUTROABS 1.8 10.5*  --   --   --   --   --   --   HGB 9.0* 8.4*   < > 8.0* 7.9* 9.0* 7.5* 7.5*  HCT 28.3* 26.9*   < > 25.8* 25.3* 29.4* 24.0* 24.1*  MCV 84 87.9   < > 88.1 88.2 87.5 87.0 88.6  PLT 44* 36*   < > 25* 24* 32* 32* 35*   < > = values in this interval not displayed.   Basic Metabolic Panel: Recent Labs  Lab 04/02/20 2026 04/03/20 0009 04/03/20 0330 04/04/20 0432 04/04/20 1050 04/05/20 0413 04/05/20 0828  NA 130*  --  133* 131*  --  134* 133*  K 4.4  --  2.6* 2.8*  --  3.2* 3.1*  CL 107  --  108 107  --  111 111  CO2 15*  --  18* 20*  --  18* 16*  GLUCOSE 100*  --  92 92  --  79 78  BUN 18  --  16 13  --  13 13  CREATININE 0.63  --  0.71 0.50*  --  0.54* 0.53*   CALCIUM 7.3*  --  7.4* 7.4*  --  7.5* 7.7*  MG  --  1.3*  --   --  2.0 1.9 1.9  PHOS  --  2.0*  --   --   --   --  2.2*   GFR: Estimated Creatinine Clearance: 116.7 mL/min (A) (by C-G formula based on SCr of 0.53 mg/dL (L)). Liver Function Tests: Recent Labs  Lab 04/02/20 2026 04/03/20  0330 04/04/20 0432 04/05/20 0413 04/05/20 0828  AST 111* 61* 80* 71* 72*  ALT 26 19 28 27 27   ALKPHOS 58 51 49 59 57  BILITOT 4.0* 3.5* 2.8* 3.0* 3.3*  PROT 6.1* 5.7* 5.3* 5.3* 5.6*  ALBUMIN 2.0* 1.8* 1.7* 1.7* 1.8*   No results for input(s): LIPASE, AMYLASE in the last 168 hours. Recent Labs  Lab 04/02/20 2200 04/04/20 1250 04/05/20 0413  AMMONIA 57* 30 49*   Coagulation Profile: Recent Labs  Lab 03/31/20 0905 04/02/20 2026 04/03/20 0330  INR 1.4* 2.9* 3.0*   Cardiac Enzymes: No results for input(s): CKTOTAL, CKMB, CKMBINDEX, TROPONINI in the last 168 hours. BNP (last 3 results) No results for input(s): PROBNP in the last 8760 hours. HbA1C: No results for input(s): HGBA1C in the last 72 hours. CBG: No results for input(s): GLUCAP in the last 168 hours. Lipid Profile: No results for input(s): CHOL, HDL, LDLCALC, TRIG, CHOLHDL, LDLDIRECT in the last 72 hours. Thyroid Function Tests: No results for input(s): TSH, T4TOTAL, FREET4, T3FREE, THYROIDAB in the last 72 hours. Anemia Panel: No results for input(s): VITAMINB12, FOLATE, FERRITIN, TIBC, IRON, RETICCTPCT in the last 72 hours. Sepsis Labs: Recent Labs  Lab 04/03/20 0009 04/03/20 0330 04/04/20 1050 04/04/20 1250  PROCALCITON  --  5.64  --   --   LATICACIDVEN 5.5* 4.4* 1.8 2.0*    Recent Results (from the past 240 hour(s))  Blood Culture (routine x 2)     Status: Abnormal   Collection Time: 04/02/20  8:26 PM   Specimen: BLOOD  Result Value Ref Range Status   Specimen Description   Final    BLOOD RIGHT ARM Performed at [facility], [address], [place], [place]    Special Requests    Final    BOTTLES DRAWN AEROBIC AND ANAEROBIC Blood Culture adequate volume Performed at [facility], [address], [place], [place]    Culture  Setup Time   Final    GRAM POSITIVE COCCI IN CLUSTERS IN BOTH AEROBIC AND ANAEROBIC BOTTLES Organism ID to follow CRITICAL RESULT CALLED TO, READ BACK BY AND VERIFIED WITH: [id number] PHARMD Janine Limbo 04/03/20 A BROWNING    Culture (A)  Final    STAPHYLOCOCCUS EPIDERMIDIS THE SIGNIFICANCE OF ISOLATING THIS ORGANISM FROM A SINGLE SET OF BLOOD CULTURES WHEN MULTIPLE SETS ARE DRAWN IS UNCERTAIN. PLEASE NOTIFY THE MICROBIOLOGY DEPARTMENT WITHIN ONE WEEK IF SPECIATION AND SENSITIVITIES ARE REQUIRED. Performed at [facility], [address], [place], [place]    Report Status 04/05/2020 FINAL  Final  Blood Culture ID Panel (Reflexed)     Status: Abnormal   Collection Time: 04/02/20  8:26 PM  Result Value Ref Range Status   Enterococcus faecalis NOT DETECTED NOT DETECTED Final   Enterococcus Faecium NOT DETECTED NOT DETECTED Final   Listeria monocytogenes NOT DETECTED NOT DETECTED Final   Staphylococcus species DETECTED (A) NOT DETECTED Final    Comment: CRITICAL RESULT CALLED TO, READ BACK BY AND VERIFIED WITH: 04/04/20 PHARMD Janine Limbo 04/03/20 A BROWNING    Staphylococcus aureus (BCID) NOT DETECTED NOT DETECTED Final   Staphylococcus epidermidis DETECTED (A) NOT DETECTED Final    Comment: CRITICAL RESULT CALLED TO, READ BACK BY AND VERIFIED WITH: 04/05/20 PHARMD Janine Limbo 04/03/20 A BROWNING    Staphylococcus lugdunensis NOT DETECTED NOT DETECTED Final   Streptococcus species NOT DETECTED NOT DETECTED Final   Streptococcus agalactiae NOT DETECTED NOT DETECTED Final   Streptococcus  pneumoniae NOT DETECTED NOT DETECTED Final   Streptococcus pyogenes NOT DETECTED NOT DETECTED Final   A.calcoaceticus-baumannii NOT DETECTED NOT DETECTED Final   Bacteroides fragilis NOT DETECTED NOT DETECTED Final   Enterobacterales  NOT DETECTED NOT DETECTED Final   Enterobacter cloacae complex NOT DETECTED NOT DETECTED Final   Escherichia coli NOT DETECTED NOT DETECTED Final   Klebsiella aerogenes NOT DETECTED NOT DETECTED Final   Klebsiella oxytoca NOT DETECTED NOT DETECTED Final   Klebsiella pneumoniae NOT DETECTED NOT DETECTED Final   Proteus species NOT DETECTED NOT DETECTED Final   Salmonella species NOT DETECTED NOT DETECTED Final   Serratia marcescens NOT DETECTED NOT DETECTED Final   Haemophilus influenzae NOT DETECTED NOT DETECTED Final   Neisseria meningitidis NOT DETECTED NOT DETECTED Final   Pseudomonas aeruginosa NOT DETECTED NOT DETECTED Final   Stenotrophomonas maltophilia NOT DETECTED NOT DETECTED Final   Candida albicans NOT DETECTED NOT DETECTED Final   Candida auris NOT DETECTED NOT DETECTED Final   Candida glabrata NOT DETECTED NOT DETECTED Final   Candida krusei NOT DETECTED NOT DETECTED Final   Candida parapsilosis NOT DETECTED NOT DETECTED Final   Candida tropicalis NOT DETECTED NOT DETECTED Final   Cryptococcus neoformans/gattii NOT DETECTED NOT DETECTED Final   Methicillin resistance mecA/C NOT DETECTED NOT DETECTED Final    Comment: Performed at [facility], [address], [place], [place] [address]  Resp Panel by RT-PCR (Flu A&B, Covid) Nasopharyngeal Swab     Status: None   Collection Time: 04/02/20  9:45 PM   Specimen: Nasopharyngeal Swab; Nasopharyngeal(NP) swabs in vial transport medium  Result Value Ref Range Status   SARS Coronavirus 2 by RT PCR NEGATIVE NEGATIVE Final    Comment: (NOTE) SARS-CoV-2 target nucleic acids are NOT DETECTED.  The SARS-CoV-2 RNA is generally detectable in upper respiratory specimens during the acute phase of infection. The lowest concentration of SARS-CoV-2 viral copies this assay can detect is 138 copies/mL. A negative result does not preclude SARS-Cov-2 infection and should not be used as the sole basis for treatment or other patient  management decisions. A negative result may occur with  improper specimen collection/handling, submission of specimen other than nasopharyngeal swab, presence of viral mutation(s) within the areas targeted by this assay, and inadequate number of viral copies(<138 copies/mL). A negative result must be combined with clinical observations, patient history, and epidemiological information. The expected result is Negative.  Fact Sheet for Patients:  BloggerCourse.com  Fact Sheet for Healthcare Providers:  SeriousBroker.it  This test is no t yet approved or cleared by the [place] FDA and  has been authorized for detection and/or diagnosis of SARS-CoV-2 by FDA under an Emergency Use Authorization (EUA). This EUA will remain  in effect (meaning this test can be used) for the duration of the COVID-19 declaration under Section 564(b)(1) of the Act, 21 U.S.C.section 360bbb-3(b)(1), unless the authorization is terminated  or revoked sooner.       Influenza A by PCR NEGATIVE NEGATIVE Final   Influenza B by PCR NEGATIVE NEGATIVE Final    Comment: (NOTE) The Xpert Xpress SARS-CoV-2/FLU/RSV plus assay is intended as an aid in the diagnosis of influenza from Nasopharyngeal swab specimens and should not be used as a sole basis for treatment. Nasal washings and aspirates are unacceptable for Xpert Xpress SARS-CoV-2/FLU/RSV testing.  Fact Sheet for Patients: BloggerCourse.com  Fact Sheet for Healthcare Providers: SeriousBroker.it  This test is not yet approved or cleared by the [place] FDA and has been  authorized for detection and/or diagnosis of SARS-CoV-2 by FDA under an Emergency Use Authorization (EUA). This EUA will remain in effect (meaning this test can be used) for the duration of the COVID-19 declaration under Section 564(b)(1) of the Act, 21 U.S.C. section 360bbb-3(b)(1),  unless the authorization is terminated or revoked.  Performed at [facility], [address], [place], [place] [address]   Blood Culture (routine x 2)     Status: None (Preliminary result)   Collection Time: 04/02/20  9:50 PM   Specimen: BLOOD  Result Value Ref Range Status   Specimen Description   Final    BLOOD LEFT ARM Performed at [facility], [address], [place], [place] [address]    Special Requests   Final    BOTTLES DRAWN AEROBIC AND ANAEROBIC Blood Culture results may not be optimal due to an inadequate volume of blood received in culture bottles Performed at [facility], [address] [place], [place] [address]    Culture   Final    NO GROWTH 1 DAY Performed at [facility], [address] [place], [place] [address]    Report Status PENDING  Incomplete     Radiology Studies: CT HEAD WO CONTRAST  Result Date: 04/04/2020 CLINICAL DATA:  Head trauma EXAM: CT HEAD WITHOUT CONTRAST TECHNIQUE: Contiguous axial images were obtained from the base of the skull through the vertex without intravenous contrast. COMPARISON:  Head CT 07/09/2016 FINDINGS: Brain: There is no mass, hemorrhage or extra-axial collection. The size and configuration of the ventricles and extra-axial CSF spaces are normal. The brain parenchyma is normal, without acute or chronic infarction. Vascular: No abnormal hyperdensity of the major intracranial arteries or dural venous sinuses. No intracranial atherosclerosis. Skull: The visualized skull base, calvarium and extracranial soft tissues are normal. Sinuses/Orbits: No fluid levels or advanced mucosal thickening of the visualized paranasal sinuses. No mastoid or middle ear effusion. The orbits are normal. IMPRESSION: Normal head CT. Electronically Signed   By: Deatra Robinson M.D.   On: 04/04/2020 19:58    Scheduled Meds: . folic acid  1 mg Oral Daily  . lactulose  20 g Oral BID  . LORazepam   0-4 mg Intravenous Q6H   Followed by  . [START ON 04/07/2020] LORazepam  0-4 mg Intravenous Q12H  . multivitamin with minerals  1 tablet Oral Daily  . pantoprazole  40 mg Oral Daily  . potassium chloride  40 mEq Oral BID  . sodium chloride flush  3 mL Intravenous Q12H  . thiamine  100 mg Oral Daily   Or  . thiamine  100 mg Intravenous Daily   Continuous Infusions: . azithromycin 500 mg (04/05/20 0955)  . cefTRIAXone (ROCEPHIN)  IV 2 g (04/05/20 1250)     LOS: 3 days   Rickey Barbara, MD [facility] Pager On Amion  If 7PM-7AM, please contact night-coverage 04/05/2020, 3:30 PM

## 2020-04-06 ENCOUNTER — Encounter (HOSPITAL_COMMUNITY): Payer: Self-pay | Admitting: Internal Medicine

## 2020-04-06 LAB — COMPREHENSIVE METABOLIC PANEL
ALT: 28 U/L (ref 0–44)
AST: 67 U/L — ABNORMAL HIGH (ref 15–41)
Albumin: 1.7 g/dL — ABNORMAL LOW (ref 3.5–5.0)
Alkaline Phosphatase: 59 U/L (ref 38–126)
Anion gap: 4 — ABNORMAL LOW (ref 5–15)
BUN: 14 mg/dL (ref 6–20)
CO2: 17 mmol/L — ABNORMAL LOW (ref 22–32)
Calcium: 7.4 mg/dL — ABNORMAL LOW (ref 8.9–10.3)
Chloride: 116 mmol/L — ABNORMAL HIGH (ref 98–111)
Creatinine, Ser: 0.61 mg/dL (ref 0.61–1.24)
GFR, Estimated: >60 mL/min (ref >60)
Glucose, Bld: 80 mg/dL (ref 70–99)
Potassium: 3.3 mmol/L — ABNORMAL LOW (ref 3.5–5.1)
Sodium: 137 mmol/L (ref 135–145)
Total Bilirubin: 3.2 mg/dL — ABNORMAL HIGH (ref 0.3–1.2)
Total Protein: 5.3 g/dL — ABNORMAL LOW (ref 6.5–8.1)

## 2020-04-06 LAB — CBC
HCT: 25.5 % — ABNORMAL LOW (ref 39.0–52.0)
Hemoglobin: 7.8 g/dL — ABNORMAL LOW (ref 13.0–17.0)
MCH: 27.4 pg (ref 26.0–34.0)
MCHC: 30.6 g/dL (ref 30.0–36.0)
MCV: 89.5 fL (ref 80.0–100.0)
Platelets: 43 10*3/uL — ABNORMAL LOW (ref 150–400)
RBC: 2.85 MIL/uL — ABNORMAL LOW (ref 4.22–5.81)
RDW: 17.2 % — ABNORMAL HIGH (ref 11.5–15.5)
WBC: 3.3 10*3/uL — ABNORMAL LOW (ref 4.0–10.5)
nRBC: 0 % (ref 0.0–0.2)

## 2020-04-06 LAB — AMMONIA: Ammonia: 26 umol/L (ref 9–35)

## 2020-04-06 LAB — MAGNESIUM: Magnesium: 1.7 mg/dL (ref 1.7–2.4)

## 2020-04-06 MED ORDER — MAGNESIUM SULFATE 2 GM/50ML IV SOLN
2.0000 g | Freq: Once | INTRAVENOUS | Status: AC
  Administered 2020-04-06: 2 g via INTRAVENOUS
  Filled 2020-04-06: qty 50

## 2020-04-06 MED ORDER — POTASSIUM CHLORIDE CRYS ER 20 MEQ PO TBCR
60.0000 meq | EXTENDED_RELEASE_TABLET | Freq: Once | ORAL | Status: AC
  Administered 2020-04-06: 60 meq via ORAL
  Filled 2020-04-06: qty 3

## 2020-04-06 NOTE — Progress Notes (Signed)
Physical Therapy Treatment Patient Details Name: Mathew [name] MRN: [id number] DOB: 07/19/1980 Today's Date: 04/06/2020    History of Present Illness 40 yo male admitted on 04/02/20 with severe sepsis, Pna and with ETOH w/d. Hx of ETOH abuse, cirrhosis    PT Comments    Pt making gradual progress.  He does require increased time to respond, cues for safety, and with mild unsteadiness - likely related to ETOH w/d.  Further treatment limited today due to breakfast arriving.  Expected to progress well with mobility.    Follow Up Recommendations  Supervision for mobility/OOB;No PT follow up     Equipment Recommendations  None recommended by PT    Recommendations for Other Services       Precautions / Restrictions Precautions Precautions: Fall    Mobility  Bed Mobility Overal bed mobility: Needs Assistance Bed Mobility: Supine to Sit;Sit to Supine     Supine to sit: Supervision Sit to supine: Min guard   General bed mobility comments: Min guard when returned to bed due to sitting near edge of bed and shifting weight - min guard to prevent fall and cues to scoot back    Transfers Overall transfer level: Needs assistance Equipment used: None Transfers: Sit to/from Stand Sit to Stand: Min guard         General transfer comment: min guard for safety  Ambulation/Gait Ambulation/Gait assistance: Min assist Gait Distance (Feet): 300 Feet Assistive device: None (occasional use of hand rail) Gait Pattern/deviations: Step-through pattern;Drifts right/left Gait velocity: decreased   General Gait Details: Pt had 3 mild LOB recovered with use of hand rail   Stairs             Wheelchair Mobility    Modified Rankin (Stroke Patients Only)       Balance Overall balance assessment: Needs assistance Sitting-balance support: No upper extremity supported Sitting balance-Leahy Scale: Good     Standing balance support: No upper extremity supported Standing  balance-Leahy Scale: Fair                              Cognition Arousal/Alertness: Awake/alert Behavior During Therapy: Flat affect Overall Cognitive Status: Impaired/Different from baseline Area of Impairment: Orientation;Problem solving;Safety/judgement                 Orientation Level: Disoriented to;Time;Situation       Safety/Judgement: Decreased awareness of safety;Decreased awareness of deficits   Problem Solving: Slow processing;Difficulty sequencing General Comments: Pt oriented to self and place - not able to state year -states "I feel lost."  Slow to respond to questions at times and unaware of unsteadiness.  Pt sat near EOB and had difficulty sequencing to scoot back for safety      Exercises      General Comments General comments (skin integrity, edema, etc.): Utilized AMN interpretor - RENE #[id number]      Pertinent Vitals/Pain Pain Assessment: No/denies pain    Home Living                      Prior Function            PT Goals (current goals can now be found in the care plan section) Acute Rehab PT Goals Patient Stated Goal: none stated PT Goal Formulation: With patient Time For Goal Achievement: 04/18/20 Potential to Achieve Goals: Good Progress towards PT goals: Progressing toward goals    Frequency  Min 3X/week      PT Plan Current plan remains appropriate    Co-evaluation              AM-PAC PT "6 Clicks" Mobility   Outcome Measure  Help needed turning from your back to your side while in a flat bed without using bedrails?: None Help needed moving from lying on your back to sitting on the side of a flat bed without using bedrails?: A Little Help needed moving to and from a bed to a chair (including a wheelchair)?: A Little Help needed standing up from a chair using your arms (e.g., wheelchair or bedside chair)?: A Little Help needed to walk in hospital room?: A Little Help needed climbing 3-5 steps  with a railing? : A Little 6 Click Score: 19    End of Session Equipment Utilized During Treatment: Gait belt Activity Tolerance: Patient tolerated treatment well Patient left: in bed;with call bell/phone within reach;with bed alarm set (4th floor -alarm automatically set; declined sititng in chair) Nurse Communication: Mobility status PT Visit Diagnosis: Unsteadiness on feet (R26.81);Muscle weakness (generalized) (M62.81)     Time: 2035-5974 PT Time Calculation (min) (ACUTE ONLY): 21 min  Charges:  $Gait Training: 8-22 mins                     Anise Salvo, PT Acute Rehab Services Pager [phone] [facility] [phone]     Rayetta Humphrey 04/06/2020, 10:52 AM

## 2020-04-06 NOTE — Progress Notes (Signed)
Scheduled Ativan not given for 0000 and 0600. CIWA score has been 0. Slept most of the shift. [place], APP notified via Secure Chat.

## 2020-04-06 NOTE — Progress Notes (Signed)
PROGRESS NOTE    Mathew [name]  QQP:[id number] DOB: March 22, 1980 DOA: 04/02/2020 PCP: [name], MD    Brief Narrative:  40 y.o. male with medical history significant of alcohol use, alcoholic liver cirrhosis, history of withdrawal, history of anemia, and hypoalbuminemia who presents with weakness, lethargy, confusion. Patient was admitted for concerns of pneumonia with sepsis  Assessment & Plan:   Principal Problem:   Severe sepsis (HCC) Active Problems:   ETOH abuse   Thrombocytopenia (HCC)   Alcoholic cirrhosis of liver without ascites (HCC)   Pneumonia  Severe sepsis with Pneumonia present on admit > Patient meets criteria for sepsis with tachycardia in the 100s, tachypnea in the 20s, leukocytosis to 11.3, fever to 102.  Severe sepsis considering lactic acid elevated at 5.1. > Likely source is his right upper lobe pneumonia. - Pt had been continued on IV fluids - Pt had been continued on empiric azithromycin and rocephin, anticipate completing 5 days of tx - lactate had trended down - recheck cmp and CBC in AM  Alcoholic cirrhosis > Patient has known alcoholic cirrhosis from chronic alcohol use. > Alcohol level was negative in the ED > Pt noted to be thrombocytopenic, see below > Meld score 24 at time of presentation - Lasix and spironolactone were held in the setting of severe sepsis - Continue thiamine and folate - RUQ reviewed, findings consistent with known hx of cirrhosis -Ammonia has normalized and is stable  Toxic metabolic encephalopathy secondary to Hepatic encephalopathy and ETOH withdrawal -ammonia peaked to 57, resolved after receiving lactulose -Continue with CIWA per below  Thrombocytopenia -Patient has baseline thrombocytopenia -likely worsened secondary to presenting sepsis -Avoid anticoagulation/antiplatelet unless necessary -Platelets are trending up -Cont to follow CBC trends  Hx ETOH abuse with acute ETOH withdrawals  -Initially  believed last ETOH was around 2 weeks prior to admit -Family reports not being sure about pt's true ETOH hisotry -ETOH level at presentation is neg -Now clinically in withdrawals with CIWA peaking to 21, now improved with ativan per CIWA protocol -Head CT was ordered given acute mental status change, reviewed and was neg -Cont CIWA. Will need cessation -Of note, chart reviewed and pt has a long hx of ETOH related issues in the past including ETOH related seizures as well as treatment by behavioral health for ETOH problems  Acute toxic metabolic encephalopathy  DVT prophylaxis: SCD's Code Status: Full Family Communication: Pt in room, family is currently at bedside  Status is: Inpatient  Remains inpatient appropriate because:IV treatments appropriate due to intensity of illness or inability to take PO and Inpatient level of care appropriate due to severity of illness   Dispo: The patient is from: Home              Anticipated d/c is to: Home              Patient currently is not medically stable to d/c.   Difficult to place patient No   Consultants:     Procedures:     Antimicrobials: Anti-infectives (From admission, onward)   Start     Dose/Rate Route Frequency Ordered Stop   04/03/20 0900  cefTRIAXone (ROCEPHIN) 2 g in sodium chloride 0.9 % 100 mL IVPB        2 g 200 mL/hr over 30 Minutes Intravenous Every 24 hours 04/03/20 0809 04/07/20 1259   04/03/20 0900  azithromycin (ZITHROMAX) 500 mg in sodium chloride 0.9 % 250 mL IVPB  500 mg 250 mL/hr over 60 Minutes Intravenous Every 24 hours 04/03/20 0809 04/08/20 0859   04/02/20 2030  ceFEPIme (MAXIPIME) 2 g in sodium chloride 0.9 % 100 mL IVPB        2 g 200 mL/hr over 30 Minutes Intravenous  Once 04/02/20 2026 04/02/20 2139   04/02/20 2030  metroNIDAZOLE (FLAGYL) IVPB 500 mg        500 mg 100 mL/hr over 60 Minutes Intravenous  Once 04/02/20 2026 04/02/20 2152      Subjective: Wanting to go home. Wants  something to drink  Objective: Vitals:   04/06/20 1000 04/06/20 1100 04/06/20 1200 04/06/20 1300  BP:      Pulse:      Resp: (!) 29 (!) 21 (!) 27 (!) 27  Temp:      TempSrc:      SpO2:      Weight:      Height:        Intake/Output Summary (Last 24 hours) at 04/06/2020 1305 Last data filed at 04/06/2020 [id number] Gross per 24 hour  Intake 240 ml  Output 1 ml  Net 239 ml   Filed Weights   04/03/20 0329 04/04/20 0431  Weight: 79.4 kg 77.4 kg    Examination: General exam: Awake, laying in bed, in nad Respiratory system: Normal respiratory effort, no wheezing Cardiovascular system: regular rate, s1, s2 Gastrointestinal system: Soft, nondistended, positive BS Central nervous system: CN2-12 grossly intact, strength intact Extremities: Perfused, no clubbing Skin: Normal skin turgor, no notable skin lesions seen Psychiatry: difficult to assess given mentation   Data Reviewed: I have personally reviewed following labs and imaging studies  CBC: Recent Labs  Lab 03/31/20 0905 04/02/20 2026 04/03/20 0009 04/03/20 0330 04/04/20 1050 04/05/20 0413 04/05/20 0828 04/06/20 0345  WBC 3.3* 11.3*   < > 11.7* 5.5 3.1* 3.0* 3.3*  NEUTROABS 1.8 10.5*  --   --   --   --   --   --   HGB 9.0* 8.4*   < > 7.9* 9.0* 7.5* 7.5* 7.8*  HCT 28.3* 26.9*   < > 25.3* 29.4* 24.0* 24.1* 25.5*  MCV 84 87.9   < > 88.2 87.5 87.0 88.6 89.5  PLT 44* 36*   < > 24* 32* 32* 35* 43*   < > = values in this interval not displayed.   Basic Metabolic Panel: Recent Labs  Lab 04/03/20 0009 04/03/20 0330 04/04/20 0432 04/04/20 1050 04/05/20 0413 04/05/20 0828 04/06/20 0345  NA  --  133* 131*  --  134* 133* 137  K  --  2.6* 2.8*  --  3.2* 3.1* 3.3*  CL  --  108 107  --  111 111 116*  CO2  --  18* 20*  --  18* 16* 17*  GLUCOSE  --  92 92  --  79 78 80  BUN  --  16 13  --  13 13 14   CREATININE  --  0.71 0.50*  --  0.54* 0.53* 0.61  CALCIUM  --  7.4* 7.4*  --  7.5* 7.7* 7.4*  MG 1.3*  --   --  2.0 1.9 1.9  1.7  PHOS 2.0*  --   --   --   --  2.2*  --    GFR: Estimated Creatinine Clearance: 116.7 mL/min (by C-G formula based on SCr of 0.61 mg/dL). Liver Function Tests: Recent Labs  Lab 04/03/20 0330 04/04/20 0432 04/05/20 0413 04/05/20 0828 04/06/20 0345  AST  61* 80* 71* 72* 67*  ALT 19 28 27 27 28   ALKPHOS 51 49 59 57 59  BILITOT 3.5* 2.8* 3.0* 3.3* 3.2*  PROT 5.7* 5.3* 5.3* 5.6* 5.3*  ALBUMIN 1.8* 1.7* 1.7* 1.8* 1.7*   No results for input(s): LIPASE, AMYLASE in the last 168 hours. Recent Labs  Lab 04/02/20 2200 04/04/20 1250 04/05/20 0413 04/06/20 0345  AMMONIA 57* 30 49* 26   Coagulation Profile: Recent Labs  Lab 03/31/20 0905 04/02/20 2026 04/03/20 0330  INR 1.4* 2.9* 3.0*   Cardiac Enzymes: No results for input(s): CKTOTAL, CKMB, CKMBINDEX, TROPONINI in the last 168 hours. BNP (last 3 results) No results for input(s): PROBNP in the last 8760 hours. HbA1C: No results for input(s): HGBA1C in the last 72 hours. CBG: No results for input(s): GLUCAP in the last 168 hours. Lipid Profile: No results for input(s): CHOL, HDL, LDLCALC, TRIG, CHOLHDL, LDLDIRECT in the last 72 hours. Thyroid Function Tests: No results for input(s): TSH, T4TOTAL, FREET4, T3FREE, THYROIDAB in the last 72 hours. Anemia Panel: No results for input(s): VITAMINB12, FOLATE, FERRITIN, TIBC, IRON, RETICCTPCT in the last 72 hours. Sepsis Labs: Recent Labs  Lab 04/03/20 0009 04/03/20 0330 04/04/20 1050 04/04/20 1250  PROCALCITON  --  5.64  --   --   LATICACIDVEN 5.5* 4.4* 1.8 2.0*    Recent Results (from the past 240 hour(s))  Blood Culture (routine x 2)     Status: Abnormal   Collection Time: 04/02/20  8:26 PM   Specimen: BLOOD  Result Value Ref Range Status   Specimen Description   Final    BLOOD RIGHT ARM Performed at [facility], [address] [place], [place]    Special Requests   Final    BOTTLES DRAWN AEROBIC AND ANAEROBIC Blood Culture  adequate volume Performed at [facility], [address] [place], [place]    Culture  Setup Time   Final    GRAM POSITIVE COCCI IN CLUSTERS IN BOTH AEROBIC AND ANAEROBIC BOTTLES Organism ID to follow CRITICAL RESULT CALLED TO, READ BACK BY AND VERIFIED WITH: [id number] PHARMD Janine Limbo 04/03/20 A BROWNING    Culture (A)  Final    STAPHYLOCOCCUS EPIDERMIDIS THE SIGNIFICANCE OF ISOLATING THIS ORGANISM FROM A SINGLE SET OF BLOOD CULTURES WHEN MULTIPLE SETS ARE DRAWN IS UNCERTAIN. PLEASE NOTIFY THE MICROBIOLOGY DEPARTMENT WITHIN ONE WEEK IF SPECIATION AND SENSITIVITIES ARE REQUIRED. Performed at [facility], [address], [place], [place]    Report Status 04/05/2020 FINAL  Final  Blood Culture ID Panel (Reflexed)     Status: Abnormal   Collection Time: 04/02/20  8:26 PM  Result Value Ref Range Status   Enterococcus faecalis NOT DETECTED NOT DETECTED Final   Enterococcus Faecium NOT DETECTED NOT DETECTED Final   Listeria monocytogenes NOT DETECTED NOT DETECTED Final   Staphylococcus species DETECTED (A) NOT DETECTED Final    Comment: CRITICAL RESULT CALLED TO, READ BACK BY AND VERIFIED WITH: 04/04/20 PHARMD Janine Limbo 04/03/20 A BROWNING    Staphylococcus aureus (BCID) NOT DETECTED NOT DETECTED Final   Staphylococcus epidermidis DETECTED (A) NOT DETECTED Final    Comment: CRITICAL RESULT CALLED TO, READ BACK BY AND VERIFIED WITH: 04/05/20 PHARMD Janine Limbo 04/03/20 A BROWNING    Staphylococcus lugdunensis NOT DETECTED NOT DETECTED Final   Streptococcus species NOT DETECTED NOT DETECTED Final   Streptococcus agalactiae NOT DETECTED NOT DETECTED Final   Streptococcus pneumoniae NOT DETECTED NOT DETECTED Final  Streptococcus pyogenes NOT DETECTED NOT DETECTED Final   A.calcoaceticus-baumannii NOT DETECTED NOT DETECTED Final   Bacteroides fragilis NOT DETECTED NOT DETECTED Final   Enterobacterales NOT DETECTED NOT DETECTED Final   Enterobacter cloacae complex  NOT DETECTED NOT DETECTED Final   Escherichia coli NOT DETECTED NOT DETECTED Final   Klebsiella aerogenes NOT DETECTED NOT DETECTED Final   Klebsiella oxytoca NOT DETECTED NOT DETECTED Final   Klebsiella pneumoniae NOT DETECTED NOT DETECTED Final   Proteus species NOT DETECTED NOT DETECTED Final   Salmonella species NOT DETECTED NOT DETECTED Final   Serratia marcescens NOT DETECTED NOT DETECTED Final   Haemophilus influenzae NOT DETECTED NOT DETECTED Final   Neisseria meningitidis NOT DETECTED NOT DETECTED Final   Pseudomonas aeruginosa NOT DETECTED NOT DETECTED Final   Stenotrophomonas maltophilia NOT DETECTED NOT DETECTED Final   Candida albicans NOT DETECTED NOT DETECTED Final   Candida auris NOT DETECTED NOT DETECTED Final   Candida glabrata NOT DETECTED NOT DETECTED Final   Candida krusei NOT DETECTED NOT DETECTED Final   Candida parapsilosis NOT DETECTED NOT DETECTED Final   Candida tropicalis NOT DETECTED NOT DETECTED Final   Cryptococcus neoformans/gattii NOT DETECTED NOT DETECTED Final   Methicillin resistance mecA/C NOT DETECTED NOT DETECTED Final    Comment: Performed at [facility], [address] [place], [place] [address]  Resp Panel by RT-PCR (Flu A&B, Covid) Nasopharyngeal Swab     Status: None   Collection Time: 04/02/20  9:45 PM   Specimen: Nasopharyngeal Swab; Nasopharyngeal(NP) swabs in vial transport medium  Result Value Ref Range Status   SARS Coronavirus 2 by RT PCR NEGATIVE NEGATIVE Final    Comment: (NOTE) SARS-CoV-2 target nucleic acids are NOT DETECTED.  The SARS-CoV-2 RNA is generally detectable in upper respiratory specimens during the acute phase of infection. The lowest concentration of SARS-CoV-2 viral copies this assay can detect is 138 copies/mL. A negative result does not preclude SARS-Cov-2 infection and should not be used as the sole basis for treatment or other patient management decisions. A negative result may occur with  improper  specimen collection/handling, submission of specimen other than nasopharyngeal swab, presence of viral mutation(s) within the areas targeted by this assay, and inadequate number of viral copies(<138 copies/mL). A negative result must be combined with clinical observations, patient history, and epidemiological information. The expected result is Negative.  Fact Sheet for Patients:  BloggerCourse.com  Fact Sheet for Healthcare Providers:  SeriousBroker.it  This test is no t yet approved or cleared by the [place] FDA and  has been authorized for detection and/or diagnosis of SARS-CoV-2 by FDA under an Emergency Use Authorization (EUA). This EUA will remain  in effect (meaning this test can be used) for the duration of the COVID-19 declaration under Section 564(b)(1) of the Act, 21 U.S.C.section 360bbb-3(b)(1), unless the authorization is terminated  or revoked sooner.       Influenza A by PCR NEGATIVE NEGATIVE Final   Influenza B by PCR NEGATIVE NEGATIVE Final    Comment: (NOTE) The Xpert Xpress SARS-CoV-2/FLU/RSV plus assay is intended as an aid in the diagnosis of influenza from Nasopharyngeal swab specimens and should not be used as a sole basis for treatment. Nasal washings and aspirates are unacceptable for Xpert Xpress SARS-CoV-2/FLU/RSV testing.  Fact Sheet for Patients: BloggerCourse.com  Fact Sheet for Healthcare Providers: SeriousBroker.it  This test is not yet approved or cleared by the [place] FDA and has been authorized for detection and/or diagnosis of SARS-CoV-2 by  FDA under an Emergency Use Authorization (EUA). This EUA will remain in effect (meaning this test can be used) for the duration of the COVID-19 declaration under Section 564(b)(1) of the Act, 21 U.S.C. section 360bbb-3(b)(1), unless the authorization is terminated or revoked.  Performed at  [facility], [address], [place], [place] [address]   Blood Culture (routine x 2)     Status: None (Preliminary result)   Collection Time: 04/02/20  9:50 PM   Specimen: BLOOD  Result Value Ref Range Status   Specimen Description   Final    BLOOD LEFT ARM Performed at [facility], [address], [place], [place] [address]    Special Requests   Final    BOTTLES DRAWN AEROBIC AND ANAEROBIC Blood Culture results may not be optimal due to an inadequate volume of blood received in culture bottles Performed at [facility], [address] [place], [place] [address]    Culture   Final    NO GROWTH 3 DAYS Performed at [facility], [address], [place], [place] [address]    Report Status PENDING  Incomplete     Radiology Studies: CT HEAD WO CONTRAST  Result Date: 04/04/2020 CLINICAL DATA:  Head trauma EXAM: CT HEAD WITHOUT CONTRAST TECHNIQUE: Contiguous axial images were obtained from the base of the skull through the vertex without intravenous contrast. COMPARISON:  Head CT 07/09/2016 FINDINGS: Brain: There is no mass, hemorrhage or extra-axial collection. The size and configuration of the ventricles and extra-axial CSF spaces are normal. The brain parenchyma is normal, without acute or chronic infarction. Vascular: No abnormal hyperdensity of the major intracranial arteries or dural venous sinuses. No intracranial atherosclerosis. Skull: The visualized skull base, calvarium and extracranial soft tissues are normal. Sinuses/Orbits: No fluid levels or advanced mucosal thickening of the visualized paranasal sinuses. No mastoid or middle ear effusion. The orbits are normal. IMPRESSION: Normal head CT. Electronically Signed   By: Deatra Robinson M.D.   On: 04/04/2020 19:58    Scheduled Meds: . folic acid  1 mg Oral Daily  . lactulose  20 g Oral BID  . LORazepam  0-4 mg Intravenous Q6H   Followed by  . [START ON 04/07/2020]  LORazepam  0-4 mg Intravenous Q12H  . multivitamin with minerals  1 tablet Oral Daily  . pantoprazole  40 mg Oral Daily  . sodium chloride flush  3 mL Intravenous Q12H  . thiamine  100 mg Oral Daily   Or  . thiamine  100 mg Intravenous Daily   Continuous Infusions: . azithromycin 500 mg (04/06/20 1131)  . cefTRIAXone (ROCEPHIN)  IV 2 g (04/05/20 1250)     LOS: 4 days   Rickey Barbara, MD [facility] Pager On Amion  If 7PM-7AM, please contact night-coverage 04/06/2020, 1:05 PM

## 2020-04-06 NOTE — Evaluation (Signed)
Occupational Therapy Evaluation Patient Details Name: Mathew [name] MRN: [id number] DOB: Sep 30, 1980 Today's Date: 04/06/2020    History of Present Illness 40 yo male admitted on 04/02/20 with severe sepsis, Pna and with ETOH w/d. Hx of ETOH abuse, cirrhosis   Clinical Impression   Unsure of Pt's PLOF as Pt unreliable historian, with iPad interpreter believe that Pt was independent in ADL and mobility. Today Pt is min guard A for transfers and in room mobility - Pt actively seeks out external support for balance "furniture surfing" and has decreased safety awareness. Pt able to don socks EOB, leans against sink for grooming. Overall at min guard A level for ADL today with main problem being balance and decreased cognition. OT will continue to follow acutely but suspect that Pt will not need OT follow up post-acute.     Follow Up Recommendations  No OT follow up;Supervision - Intermittent    Equipment Recommendations  None recommended by OT    Recommendations for Other Services       Precautions / Restrictions Precautions Precautions: Fall Restrictions Weight Bearing Restrictions: No      Mobility Bed Mobility Overal bed mobility: Needs Assistance Bed Mobility: Supine to Sit;Sit to Supine     Supine to sit: Supervision Sit to supine: Supervision   General bed mobility comments: supervision for safety    Transfers Overall transfer level: Needs assistance Equipment used: None Transfers: Sit to/from Stand Sit to Stand: Min guard         General transfer comment: min guard A for safety/steady assist    Balance Overall balance assessment: Needs assistance Sitting-balance support: No upper extremity supported Sitting balance-Leahy Scale: Good     Standing balance support: No upper extremity supported Standing balance-Leahy Scale: Fair Standing balance comment: during static standing leans against sink and often seeks out external support in dynamic standing  balanec                           ADL either performed or assessed with clinical judgement   ADL Overall ADL's : Needs assistance/impaired Eating/Feeding: Set up;Sitting   Grooming: Wash/dry hands;Wash/dry face;Oral care;Minimal assistance;Standing Grooming Details (indicate cue type and reason): Pt required cues for attention to task, how to open containers (toothbrush), sequencing, forgot to turn off water Upper Body Bathing: Minimal assistance;Sitting Upper Body Bathing Details (indicate cue type and reason): unsafe balance in static standing Lower Body Bathing: Minimal assistance;Sitting/lateral leans   Upper Body Dressing : Minimal assistance   Lower Body Dressing: Minimal assistance;Sitting/lateral leans Lower Body Dressing Details (indicate cue type and reason): able to don socks using figure 4 method decreased balance for sit<>stand Toilet Transfer: Min guard;Minimal assistance;Ambulation   Toileting- Clothing Manipulation and Hygiene: Min guard;Sit to/from stand Toileting - Clothing Manipulation Details (indicate cue type and reason): use of grab bars Tub/ Shower Transfer: Minimal assistance   Functional mobility during ADLs: Min guard;Minimal assistance;Cueing for safety       Vision         Perception     Praxis      Pertinent Vitals/Pain Pain Assessment: No/denies pain Faces Pain Scale: No hurt     Hand Dominance     Extremity/Trunk Assessment Upper Extremity Assessment Upper Extremity Assessment: Generalized weakness   Lower Extremity Assessment Lower Extremity Assessment: Defer to PT evaluation   Cervical / Trunk Assessment Cervical / Trunk Assessment: Normal   Communication Communication Communication: Prefers language other than English;Interpreter utilized (  IPad Interpreter)   Cognition Arousal/Alertness: Awake/alert Behavior During Therapy: Flat affect Overall Cognitive Status: Impaired/Different from baseline Area of Impairment:  Orientation;Problem solving;Safety/judgement                 Orientation Level: Disoriented to;Time;Situation       Safety/Judgement: Decreased awareness of safety;Decreased awareness of deficits   Problem Solving: Slow processing;Difficulty sequencing General Comments: Pt oriented to self, needing sequencing for ADL tasks, problem solving for opening containers, decreased safety awareness - language impacting - EtOH WD?   General Comments  Utilized AMN interpretor Bradly Chris #[id number]    Exercises     Shoulder Instructions      Home Living Family/patient expects to be discharged to:: Private residence Living Arrangements: Other (Comment)                                      Prior Functioning/Environment          Comments: unsure-likely independent. pt denied using rw or cane. unreliable historian 2* some confusion and language barrier        OT Problem List: Decreased activity tolerance;Impaired balance (sitting and/or standing);Decreased cognition;Decreased safety awareness      OT Treatment/Interventions: Self-care/ADL training;Therapeutic exercise;DME and/or AE instruction;Therapeutic activities;Patient/family education;Balance training    OT Goals(Current goals can be found in the care plan section) Acute Rehab OT Goals Patient Stated Goal: none stated OT Goal Formulation: With patient ADL Goals Pt Will Perform Grooming: with modified independence;standing Pt Will Perform Upper Body Dressing: with modified independence;sitting Pt Will Perform Lower Body Dressing: with modified independence;sit to/from stand Pt Will Transfer to Toilet: with modified independence;ambulating Pt Will Perform Toileting - Clothing Manipulation and hygiene: Independently;sit to/from stand;sitting/lateral leans  OT Frequency: Min 2X/week   Barriers to D/C:            Co-evaluation              AM-PAC OT "6 Clicks" Daily Activity     Outcome Measure Help  from another person eating meals?: A Little Help from another person taking care of personal grooming?: A Little Help from another person toileting, which includes using toliet, bedpan, or urinal?: A Little Help from another person bathing (including washing, rinsing, drying)?: A Little Help from another person to put on and taking off regular upper body clothing?: A Little Help from another person to put on and taking off regular lower body clothing?: A Little 6 Click Score: 18   End of Session Equipment Utilized During Treatment: Gait belt Nurse Communication: Mobility status;Precautions  Activity Tolerance: Patient tolerated treatment well Patient left: in bed;with call bell/phone within reach;with bed alarm set  OT Visit Diagnosis: Unsteadiness on feet (R26.81);Other abnormalities of gait and mobility (R26.89);Muscle weakness (generalized) (M62.81);Other symptoms and signs involving cognitive function                Time: 0630-1601 OT Time Calculation (min): 20 min Charges:  OT General Charges $OT Visit: 1 Visit OT Evaluation $OT Eval Moderate Complexity: 1 Mod  Nyoka Cowden OTR/L Acute Rehabilitation Services Pager: [phone] Office: [phone]  Evern Bio Marv Alfrey 04/06/2020, 2:47 PM

## 2020-04-07 ENCOUNTER — Encounter (HOSPITAL_COMMUNITY): Payer: Self-pay | Admitting: Internal Medicine

## 2020-04-07 ENCOUNTER — Other Ambulatory Visit (HOSPITAL_COMMUNITY): Payer: Self-pay | Admitting: Internal Medicine

## 2020-04-07 LAB — COMPREHENSIVE METABOLIC PANEL
ALT: 26 U/L (ref 0–44)
AST: 63 U/L — ABNORMAL HIGH (ref 15–41)
Albumin: 1.7 g/dL — ABNORMAL LOW (ref 3.5–5.0)
Alkaline Phosphatase: 86 U/L (ref 38–126)
Anion gap: 4 — ABNORMAL LOW (ref 5–15)
BUN: 9 mg/dL (ref 6–20)
CO2: 17 mmol/L — ABNORMAL LOW (ref 22–32)
Calcium: 7.2 mg/dL — ABNORMAL LOW (ref 8.9–10.3)
Chloride: 112 mmol/L — ABNORMAL HIGH (ref 98–111)
Creatinine, Ser: 0.53 mg/dL — ABNORMAL LOW (ref 0.61–1.24)
GFR, Estimated: >60 mL/min (ref >60)
Glucose, Bld: 121 mg/dL — ABNORMAL HIGH (ref 70–99)
Potassium: 3.9 mmol/L (ref 3.5–5.1)
Sodium: 133 mmol/L — ABNORMAL LOW (ref 135–145)
Total Bilirubin: 2.1 mg/dL — ABNORMAL HIGH (ref 0.3–1.2)
Total Protein: 5.4 g/dL — ABNORMAL LOW (ref 6.5–8.1)

## 2020-04-07 LAB — CBC
HCT: 24.9 % — ABNORMAL LOW (ref 39.0–52.0)
Hemoglobin: 7.8 g/dL — ABNORMAL LOW (ref 13.0–17.0)
MCH: 28.1 pg (ref 26.0–34.0)
MCHC: 31.3 g/dL (ref 30.0–36.0)
MCV: 89.6 fL (ref 80.0–100.0)
Platelets: 55 10*3/uL — ABNORMAL LOW (ref 150–400)
RBC: 2.78 MIL/uL — ABNORMAL LOW (ref 4.22–5.81)
RDW: 18.6 % — ABNORMAL HIGH (ref 11.5–15.5)
WBC: 4 10*3/uL (ref 4.0–10.5)
nRBC: 0 % (ref 0.0–0.2)

## 2020-04-07 LAB — AMMONIA: Ammonia: 44 umol/L — ABNORMAL HIGH (ref 9–35)

## 2020-04-07 LAB — MAGNESIUM: Magnesium: 1.6 mg/dL — ABNORMAL LOW (ref 1.7–2.4)

## 2020-04-07 MED ORDER — MAGNESIUM SULFATE 4 GM/100ML IV SOLN
4.0000 g | Freq: Once | INTRAVENOUS | Status: AC
  Administered 2020-04-07: 4 g via INTRAVENOUS
  Filled 2020-04-07: qty 100

## 2020-04-07 MED ORDER — CEFDINIR 300 MG PO CAPS: 300.0000 mg | ORAL_CAPSULE | Freq: Two times a day (BID) | ORAL | 0 refills | Status: DC

## 2020-04-07 NOTE — Discharge Summary (Signed)
Physician Discharge Summary  Mathew [name] [name] WUX:[id number] DOB: 09-09-1980 DOA: 04/02/2020  PCP: [name], MD  Admit date: 04/02/2020 Discharge date: 04/07/2020  Admitted From: Home Disposition:  Home  Recommendations for Outpatient Follow-up:  1. Follow up with PCP in 2-3 weeks  Discharge Condition:Improved CODE STATUS:Full Diet recommendation: Regular   Brief/Interim Summary: 39 y.o.malewith medical history significant ofalcohol use, alcoholic liver cirrhosis, history of withdrawal, history of anemia, and hypoalbuminemia who presents with weakness, lethargy, confusion.Patient was admitted for concerns of pneumonia with sepsis  Discharge Diagnoses:  Principal Problem:   Severe sepsis (HCC) Active Problems:   ETOH abuse   Thrombocytopenia (HCC)   Alcoholic cirrhosis of liver without ascites (HCC)   Pneumonia  Severe sepsis with Pneumonia present on admit >Patient meets criteria for sepsis with tachycardia in the 100s, tachypnea in the 20s, leukocytosis to 11.3, fever to 102. Severe sepsis considering lactic acid elevated at 5.1. >Likely source is his right upper lobe pneumonia. -Pt had been continued on IV fluids -Pt had been continued on empiric azithromycin and rocephin, pt to complete 5 days of tx -lactate had trended down  Alcoholic cirrhosis >Patient has known alcoholic cirrhosis from chronic alcohol use. >Alcohol level was negative in the ED >Pt noted to be thrombocytopenic, see below >Meld score 24 at time of presentation -Lasix and spironolactone were held in the setting of severe sepsis -Patient was continued on thiamine and folate -RUQ reviewed, findings consistent with known hx of cirrhosis -Ammonia has improved  Toxic metabolic encephalopathy secondary to Hepatic encephalopathy and ETOH withdrawal -ammonia peaked to 57, resolved after receiving lactulose -Patient was continued with CIWA per below  Thrombocytopenia -Patient  has baseline thrombocytopenia -likely worsened secondary to presenting sepsis -Avoid anticoagulation/antiplatelet unless necessary -Platelets are trending up  Hx ETOH abuse with acute ETOH withdrawals  -Initially believed last ETOH was around 2 weeks prior to admit -Family reports not being sure about pt's true ETOH hisotry -ETOH level at presentation is neg -Developed ETOH withdrawals with CIWA peaking to 21, now improved with ativan per CIWA protocol -Head CT was ordered given acute mental status change, reviewed and was neg -Cont CIWA. Will need cessation -Of note, chart reviewed and pt has a long hx of ETOH related issues in the past including ETOH related seizures as well as treatment by behavioral health for ETOH problems  Acute toxic metabolic encephalopathy  Discharge Instructions   Allergies as of 04/07/2020   No Known Allergies     Medication List    STOP taking these medications   naltrexone 50 MG tablet Commonly known as: DEPADE     TAKE these medications   cefdinir 300 MG capsule Commonly known as: OMNICEF Take 1 capsule (300 mg total) by mouth 2 (two) times daily for 1 day.   folic acid 1 MG tablet Commonly known as: FOLVITE Take 1 tablet (1 mg total) by mouth daily.   furosemide 40 MG tablet Commonly known as: Lasix Take 1 tablet (40 mg total) by mouth daily as needed for edema.   spironolactone 100 MG tablet Commonly known as: ALDACTONE Take 1 tablet (100 mg total) by mouth daily.   thiamine 100 MG tablet Take 1 tablet (100 mg total) by mouth daily.   Vitamin D (Ergocalciferol) 1.25 MG (50000 UNIT) Caps capsule Commonly known as: DRISDOL Take 1 capsule (50,000 Units total) by mouth every 7 (seven) days. What changed: additional instructions       Follow-up Information    Shan Levans  E, MD. Schedule an appointment as soon as possible for a visit in 2 week(s).   Specialty: Pulmonary Disease Contact information: [address]  [place] [address] [phone]              No Known Allergies   Procedures/Studies: DG Abdomen 1 View  Result Date: 04/02/2020 CLINICAL DATA:  Generalized weakness and lethargy for 2 days, vomiting EXAM: ABDOMEN - 1 VIEW COMPARISON:  None. FINDINGS: Two supine frontal views of the abdomen and pelvis are obtained. Moderate gaseous distention of the colon. No evidence of small-bowel obstruction or ileus. No masses or abnormal calcifications. No acute bony abnormalities. IMPRESSION: 1. No evidence of bowel obstruction or ileus. Moderate gas distention of the colon. Electronically Signed   By: Sharlet Salina M.D.   On: 04/02/2020 21:13   CT HEAD WO CONTRAST  Result Date: 04/04/2020 CLINICAL DATA:  Head trauma EXAM: CT HEAD WITHOUT CONTRAST TECHNIQUE: Contiguous axial images were obtained from the base of the skull through the vertex without intravenous contrast. COMPARISON:  Head CT 07/09/2016 FINDINGS: Brain: There is no mass, hemorrhage or extra-axial collection. The size and configuration of the ventricles and extra-axial CSF spaces are normal. The brain parenchyma is normal, without acute or chronic infarction. Vascular: No abnormal hyperdensity of the major intracranial arteries or dural venous sinuses. No intracranial atherosclerosis. Skull: The visualized skull base, calvarium and extracranial soft tissues are normal. Sinuses/Orbits: No fluid levels or advanced mucosal thickening of the visualized paranasal sinuses. No mastoid or middle ear effusion. The orbits are normal. IMPRESSION: Normal head CT. Electronically Signed   By: Deatra Robinson M.D.   On: 04/04/2020 19:58   DG Chest Port 1 View  Result Date: 04/02/2020 CLINICAL DATA:  Weakness, lethargy for 2 days, emesis EXAM: PORTABLE CHEST 1 VIEW COMPARISON:  09/03/2019 FINDINGS: Single frontal view of the chest demonstrates an unremarkable cardiac silhouette. Increased density right upper hemithorax consistent with underlying  airspace disease and/or loculated effusion. No effusion or pneumothorax. No acute bony abnormalities. IMPRESSION: Right upper lobe consolidation and/or loculated effusion. Findings are consistent with underlying pneumonia. Electronically Signed   By: Sharlet Salina M.D.   On: 04/02/2020 21:16   US Abdomen Limited RUQ (LIVER/GB)  Result Date: 04/03/2020 CLINICAL DATA:  Elevated liver function tests. EXAM: ULTRASOUND ABDOMEN LIMITED RIGHT UPPER QUADRANT COMPARISON:  March 14, 2018. FINDINGS: Gallbladder: No cholelithiasis is noted. Mild to moderate gallbladder wall thickening is noted at approximately 5 mm. No sonographic Murphy's sign is noted. No pericholecystic fluid is noted. Common bile duct: Diameter: 6 mm which is within normal limits. Liver: No focal lesion identified. Increased echogenicity of hepatic parenchyma is noted with slightly nodular hepatic contours suggesting possible hepatic cirrhosis. Portal vein is patent on color Doppler imaging with normal direction of blood flow towards the liver. Other: None. IMPRESSION: Increased echogenicity of hepatic parenchyma is noted with slightly nodular hepatic contours suggesting of hepatic cirrhosis. No definite focal sonographic hepatic abnormality is noted. Mild to moderate gallbladder wall thickening is noted which most likely is due to adjacent hepatocellular disease. No definite cholelithiasis is noted. Electronically Signed   By: Lupita Raider M.D.   On: 04/03/2020 12:49    Subjective: Very eager to go home  Discharge Exam: Vitals:   04/07/20 0839 04/07/20 1215  BP: 106/65 105/62  Pulse: 75 81  Resp:  16  Temp: 99.5 F (37.5 C) 98 F (36.7 C)  SpO2: 97% 98%   Vitals:   04/06/20 2131 04/07/20  7829 04/07/20 0839 04/07/20 1215  BP: 138/79 131/81 106/65 105/62  Pulse: 98 92 75 81  Resp: 20 (!) 24  16  Temp: 100.3 F (37.9 C) (!) 102.5 F (39.2 C) 99.5 F (37.5 C) 98 F (36.7 C)  TempSrc:  Oral Oral   SpO2: 97% 98% 97% 98%   Weight:  77.9 kg    Height:        General: Pt is alert, awake, not in acute distress Cardiovascular: RRR, S1/S2 +, no rubs, no gallops Respiratory: CTA bilaterally, no wheezing, no rhonchi Abdominal: Soft, NT, ND, bowel sounds + Extremities: no edema, no cyanosis   The results of significant diagnostics from this hospitalization (including imaging, microbiology, ancillary and laboratory) are listed below for reference.     Microbiology: Recent Results (from the past 240 hour(s))  Blood Culture (routine x 2)     Status: Abnormal   Collection Time: 04/02/20  8:26 PM   Specimen: BLOOD  Result Value Ref Range Status   Specimen Description   Final    BLOOD RIGHT ARM Performed at [facility], [address], [place], [place] [address]    Special Requests   Final    BOTTLES DRAWN AEROBIC AND ANAEROBIC Blood Culture adequate volume Performed at [facility], [address] [place], [place] [address]    Culture  Setup Time   Final    GRAM POSITIVE COCCI IN CLUSTERS IN BOTH AEROBIC AND ANAEROBIC BOTTLES Organism ID to follow CRITICAL RESULT CALLED TO, READ BACK BY AND VERIFIED WITH: Janine Limbo PHARMD [id number] 04/03/20 A BROWNING    Culture (A)  Final    STAPHYLOCOCCUS EPIDERMIDIS THE SIGNIFICANCE OF ISOLATING THIS ORGANISM FROM A SINGLE SET OF BLOOD CULTURES WHEN MULTIPLE SETS ARE DRAWN IS UNCERTAIN. PLEASE NOTIFY THE MICROBIOLOGY DEPARTMENT WITHIN ONE WEEK IF SPECIATION AND SENSITIVITIES ARE REQUIRED. Performed at [facility], [address] [place], [place] [address]    Report Status 04/05/2020 FINAL  Final  Blood Culture ID Panel (Reflexed)     Status: Abnormal   Collection Time: 04/02/20  8:26 PM  Result Value Ref Range Status   Enterococcus faecalis NOT DETECTED NOT DETECTED Final   Enterococcus Faecium NOT DETECTED NOT DETECTED Final   Listeria monocytogenes NOT DETECTED NOT DETECTED Final   Staphylococcus species DETECTED (A) NOT  DETECTED Final    Comment: CRITICAL RESULT CALLED TO, READ BACK BY AND VERIFIED WITH: Janine Limbo PHARMD [id number] 04/03/20 A BROWNING    Staphylococcus aureus (BCID) NOT DETECTED NOT DETECTED Final   Staphylococcus epidermidis DETECTED (A) NOT DETECTED Final    Comment: CRITICAL RESULT CALLED TO, READ BACK BY AND VERIFIED WITH: Janine Limbo PHARMD [id number] 04/03/20 A BROWNING    Staphylococcus lugdunensis NOT DETECTED NOT DETECTED Final   Streptococcus species NOT DETECTED NOT DETECTED Final   Streptococcus agalactiae NOT DETECTED NOT DETECTED Final   Streptococcus pneumoniae NOT DETECTED NOT DETECTED Final   Streptococcus pyogenes NOT DETECTED NOT DETECTED Final   A.calcoaceticus-baumannii NOT DETECTED NOT DETECTED Final   Bacteroides fragilis NOT DETECTED NOT DETECTED Final   Enterobacterales NOT DETECTED NOT DETECTED Final   Enterobacter cloacae complex NOT DETECTED NOT DETECTED Final   Escherichia coli NOT DETECTED NOT DETECTED Final   Klebsiella aerogenes NOT DETECTED NOT DETECTED Final   Klebsiella oxytoca NOT DETECTED NOT DETECTED Final   Klebsiella pneumoniae NOT DETECTED NOT DETECTED Final   Proteus species NOT DETECTED NOT DETECTED Final   Salmonella species NOT DETECTED  NOT DETECTED Final   Serratia marcescens NOT DETECTED NOT DETECTED Final   Haemophilus influenzae NOT DETECTED NOT DETECTED Final   Neisseria meningitidis NOT DETECTED NOT DETECTED Final   Pseudomonas aeruginosa NOT DETECTED NOT DETECTED Final   Stenotrophomonas maltophilia NOT DETECTED NOT DETECTED Final   Candida albicans NOT DETECTED NOT DETECTED Final   Candida auris NOT DETECTED NOT DETECTED Final   Candida glabrata NOT DETECTED NOT DETECTED Final   Candida krusei NOT DETECTED NOT DETECTED Final   Candida parapsilosis NOT DETECTED NOT DETECTED Final   Candida tropicalis NOT DETECTED NOT DETECTED Final   Cryptococcus neoformans/gattii NOT DETECTED NOT DETECTED Final   Methicillin resistance mecA/C NOT DETECTED NOT  DETECTED Final    Comment: Performed at [facility], [address] [place], [place] [address]  Resp Panel by RT-PCR (Flu A&B, Covid) Nasopharyngeal Swab     Status: None   Collection Time: 04/02/20  9:45 PM   Specimen: Nasopharyngeal Swab; Nasopharyngeal(NP) swabs in vial transport medium  Result Value Ref Range Status   SARS Coronavirus 2 by RT PCR NEGATIVE NEGATIVE Final    Comment: (NOTE) SARS-CoV-2 target nucleic acids are NOT DETECTED.  The SARS-CoV-2 RNA is generally detectable in upper respiratory specimens during the acute phase of infection. The lowest concentration of SARS-CoV-2 viral copies this assay can detect is 138 copies/mL. A negative result does not preclude SARS-Cov-2 infection and should not be used as the sole basis for treatment or other patient management decisions. A negative result may occur with  improper specimen collection/handling, submission of specimen other than nasopharyngeal swab, presence of viral mutation(s) within the areas targeted by this assay, and inadequate number of viral copies(<138 copies/mL). A negative result must be combined with clinical observations, patient history, and epidemiological information. The expected result is Negative.  Fact Sheet for Patients:  BloggerCourse.comhttps://www.fda.gov/media/152166/download  Fact Sheet for Healthcare Providers:  SeriousBroker.ithttps://www.fda.gov/media/152162/download  This test is no t yet approved or cleared by the [place] FDA and  has been authorized for detection and/or diagnosis of SARS-CoV-2 by FDA under an Emergency Use Authorization (EUA). This EUA will remain  in effect (meaning this test can be used) for the duration of the COVID-19 declaration under Section 564(b)(1) of the Act, 21 U.S.C.section 360bbb-3(b)(1), unless the authorization is terminated  or revoked sooner.       Influenza A by PCR NEGATIVE NEGATIVE Final   Influenza B by PCR NEGATIVE NEGATIVE Final    Comment: (NOTE) The Xpert  Xpress SARS-CoV-2/FLU/RSV plus assay is intended as an aid in the diagnosis of influenza from Nasopharyngeal swab specimens and should not be used as a sole basis for treatment. Nasal washings and aspirates are unacceptable for Xpert Xpress SARS-CoV-2/FLU/RSV testing.  Fact Sheet for Patients: BloggerCourse.comhttps://www.fda.gov/media/152166/download  Fact Sheet for Healthcare Providers: SeriousBroker.ithttps://www.fda.gov/media/152162/download  This test is not yet approved or cleared by the [place] FDA and has been authorized for detection and/or diagnosis of SARS-CoV-2 by FDA under an Emergency Use Authorization (EUA). This EUA will remain in effect (meaning this test can be used) for the duration of the COVID-19 declaration under Section 564(b)(1) of the Act, 21 U.S.C. section 360bbb-3(b)(1), unless the authorization is terminated or revoked.  Performed at [facility], [address] [place], [place] [id number]   Blood Culture (routine x 2)     Status: None (Preliminary result)   Collection Time: 04/02/20  9:50 PM   Specimen: BLOOD  Result Value Ref Range Status   Specimen  Description   Final    BLOOD LEFT ARM Performed at [facility], [address] [place], [place] [address]    Special Requests   Final    BOTTLES DRAWN AEROBIC AND ANAEROBIC Blood Culture results may not be optimal due to an inadequate volume of blood received in culture bottles Performed at [facility], [address] [place], [place] [address]    Culture   Final    NO GROWTH 4 DAYS Performed at [facility], [address], [place], [place] [address]    Report Status PENDING  Incomplete     Labs: BNP (last 3 results) No results for input(s): BNP in the last 8760 hours. Basic Metabolic Panel: Recent Labs  Lab 04/03/20 0009 04/03/20 0330 04/04/20 0432 04/04/20 1050 04/05/20 0413 04/05/20 0828 04/06/20 0345 04/07/20 0436  NA  --    < > 131*  --   134* 133* 137 133*  K  --    < > 2.8*  --  3.2* 3.1* 3.3* 3.9  CL  --    < > 107  --  111 111 116* 112*  CO2  --    < > 20*  --  18* 16* 17* 17*  GLUCOSE  --    < > 92  --  79 78 80 121*  BUN  --    < > 13  --  CREATININE  --    < > 0.50*  --  0.54* 0.53* 0.61 0.53*  CALCIUM  --    < > 7.4*  --  7.5* 7.7* 7.4* 7.2*  MG 1.3*  --   --  2.0 1.9 1.9 1.7 1.6*  PHOS 2.0*  --   --   --   --  2.2*  --   --    < > = values in this interval not displayed.   Liver Function Tests: Recent Labs  Lab 04/04/20 0432 04/05/20 0413 04/05/20 0828 04/06/20 0345 04/07/20 0436  AST 80* 71* 72* 67* 63*  ALT ALKPHOS 49 59 57 59 86  BILITOT 2.8* 3.0* 3.3* 3.2* 2.1*  PROT 5.3* 5.3* 5.6* 5.3* 5.4*  ALBUMIN 1.7* 1.7* 1.8* 1.7* 1.7*   No results for input(s): LIPASE, AMYLASE in the last 168 hours. Recent Labs  Lab 04/02/20 2200 04/04/20 1250 04/05/20 0413 04/06/20 0345 04/07/20 0436  AMMONIA 57* 30 49* 26 44*   CBC: Recent Labs  Lab 04/02/20 2026 04/03/20 0009 04/04/20 1050 04/05/20 0413 04/05/20 0828 04/06/20 0345 04/07/20 0436  WBC 11.3*   < > 5.5 3.1* 3.0* 3.3* 4.0  NEUTROABS 10.5*  --   --   --   --   --   --   HGB 8.4*   < > 9.0* 7.5* 7.5* 7.8* 7.8*  HCT 26.9*   < > 29.4* 24.0* 24.1* 25.5* 24.9*  MCV 87.9   < > 87.5 87.0 88.6 89.5 89.6  PLT 36*   < > 32* 32* 35* 43* 55*   < > = values in this interval not displayed.   Cardiac Enzymes: No results for input(s): CKTOTAL, CKMB, CKMBINDEX, TROPONINI in the last 168 hours. BNP: Invalid input(s): POCBNP CBG: No results for input(s): GLUCAP in the last 168 hours. D-Dimer No results for input(s): DDIMER in the last 72 hours. Hgb A1c No results for input(s): HGBA1C in the last 72 hours. Lipid Profile No results for input(s):  CHOL, HDL, LDLCALC, TRIG, CHOLHDL, LDLDIRECT in the last 72 hours. Thyroid function studies No results for input(s): TSH, T4TOTAL, T3FREE, THYROIDAB in the last 72 hours.  Invalid  input(s): FREET3 Anemia work up No results for input(s): VITAMINB12, FOLATE, FERRITIN, TIBC, IRON, RETICCTPCT in the last 72 hours. Urinalysis    Component Value Date/Time   COLORURINE AMBER (A) 04/04/2020 1908   APPEARANCEUR CLEAR 04/04/2020 1908   LABSPEC 1.025 04/04/2020 1908   PHURINE 6.0 04/04/2020 1908   GLUCOSEU NEGATIVE 04/04/2020 1908   HGBUR SMALL (A) 04/04/2020 1908   BILIRUBINUR NEGATIVE 04/04/2020 1908   KETONESUR NEGATIVE 04/04/2020 1908   PROTEINUR NEGATIVE 04/04/2020 1908   UROBILINOGEN 0.2 08/02/2014 2045   NITRITE NEGATIVE 04/04/2020 1908   LEUKOCYTESUR NEGATIVE 04/04/2020 1908   Sepsis Labs Invalid input(s): PROCALCITONIN,  WBC,  LACTICIDVEN Microbiology Recent Results (from the past 240 hour(s))  Blood Culture (routine x 2)     Status: Abnormal   Collection Time: 04/02/20  8:26 PM   Specimen: BLOOD  Result Value Ref Range Status   Specimen Description   Final    BLOOD RIGHT ARM Performed at [facility], [address], [place], [place] [address]    Special Requests   Final    BOTTLES DRAWN AEROBIC AND ANAEROBIC Blood Culture adequate volume Performed at [facility], [address] [place], [place] [address]    Culture  Setup Time   Final    GRAM POSITIVE COCCI IN CLUSTERS IN BOTH AEROBIC AND ANAEROBIC BOTTLES Organism ID to follow CRITICAL RESULT CALLED TO, READ BACK BY AND VERIFIED WITH: Janine Limbo PHARMD [id number] 04/03/20 A BROWNING    Culture (A)  Final    STAPHYLOCOCCUS EPIDERMIDIS THE SIGNIFICANCE OF ISOLATING THIS ORGANISM FROM A SINGLE SET OF BLOOD CULTURES WHEN MULTIPLE SETS ARE DRAWN IS UNCERTAIN. PLEASE NOTIFY THE MICROBIOLOGY DEPARTMENT WITHIN ONE WEEK IF SPECIATION AND SENSITIVITIES ARE REQUIRED. Performed at [facility], [address], [place], [place] [address]    Report Status 04/05/2020 FINAL  Final  Blood Culture ID Panel (Reflexed)     Status: Abnormal   Collection Time: 04/02/20  8:26 PM   Result Value Ref Range Status   Enterococcus faecalis NOT DETECTED NOT DETECTED Final   Enterococcus Faecium NOT DETECTED NOT DETECTED Final   Listeria monocytogenes NOT DETECTED NOT DETECTED Final   Staphylococcus species DETECTED (A) NOT DETECTED Final    Comment: CRITICAL RESULT CALLED TO, READ BACK BY AND VERIFIED WITH: Janine Limbo PHARMD [id number] 04/03/20 A BROWNING    Staphylococcus aureus (BCID) NOT DETECTED NOT DETECTED Final   Staphylococcus epidermidis DETECTED (A) NOT DETECTED Final    Comment: CRITICAL RESULT CALLED TO, READ BACK BY AND VERIFIED WITH: Janine Limbo PHARMD [id number] 04/03/20 A BROWNING    Staphylococcus lugdunensis NOT DETECTED NOT DETECTED Final   Streptococcus species NOT DETECTED NOT DETECTED Final   Streptococcus agalactiae NOT DETECTED NOT DETECTED Final   Streptococcus pneumoniae NOT DETECTED NOT DETECTED Final   Streptococcus pyogenes NOT DETECTED NOT DETECTED Final   A.calcoaceticus-baumannii NOT DETECTED NOT DETECTED Final   Bacteroides fragilis NOT DETECTED NOT DETECTED Final   Enterobacterales NOT DETECTED NOT DETECTED Final   Enterobacter cloacae complex NOT DETECTED NOT DETECTED Final   Escherichia coli NOT DETECTED NOT DETECTED Final   Klebsiella aerogenes NOT DETECTED NOT DETECTED Final   Klebsiella oxytoca NOT DETECTED NOT DETECTED Final   Klebsiella pneumoniae NOT DETECTED NOT DETECTED Final   Proteus species NOT DETECTED  NOT DETECTED Final   Salmonella species NOT DETECTED NOT DETECTED Final   Serratia marcescens NOT DETECTED NOT DETECTED Final   Haemophilus influenzae NOT DETECTED NOT DETECTED Final   Neisseria meningitidis NOT DETECTED NOT DETECTED Final   Pseudomonas aeruginosa NOT DETECTED NOT DETECTED Final   Stenotrophomonas maltophilia NOT DETECTED NOT DETECTED Final   Candida albicans NOT DETECTED NOT DETECTED Final   Candida auris NOT DETECTED NOT DETECTED Final   Candida glabrata NOT DETECTED NOT DETECTED Final   Candida krusei NOT DETECTED  NOT DETECTED Final   Candida parapsilosis NOT DETECTED NOT DETECTED Final   Candida tropicalis NOT DETECTED NOT DETECTED Final   Cryptococcus neoformans/gattii NOT DETECTED NOT DETECTED Final   Methicillin resistance mecA/C NOT DETECTED NOT DETECTED Final    Comment: Performed at [facility], [address] [place], [place] [address]  Resp Panel by RT-PCR (Flu A&B, Covid) Nasopharyngeal Swab     Status: None   Collection Time: 04/02/20  9:45 PM   Specimen: Nasopharyngeal Swab; Nasopharyngeal(NP) swabs in vial transport medium  Result Value Ref Range Status   SARS Coronavirus 2 by RT PCR NEGATIVE NEGATIVE Final    Comment: (NOTE) SARS-CoV-2 target nucleic acids are NOT DETECTED.  The SARS-CoV-2 RNA is generally detectable in upper respiratory specimens during the acute phase of infection. The lowest concentration of SARS-CoV-2 viral copies this assay can detect is 138 copies/mL. A negative result does not preclude SARS-Cov-2 infection and should not be used as the sole basis for treatment or other patient management decisions. A negative result may occur with  improper specimen collection/handling, submission of specimen other than nasopharyngeal swab, presence of viral mutation(s) within the areas targeted by this assay, and inadequate number of viral copies(<138 copies/mL). A negative result must be combined with clinical observations, patient history, and epidemiological information. The expected result is Negative.  Fact Sheet for Patients:  BloggerCourse.com  Fact Sheet for Healthcare Providers:  SeriousBroker.it  This test is no t yet approved or cleared by the [place] FDA and  has been authorized for detection and/or diagnosis of SARS-CoV-2 by FDA under an Emergency Use Authorization (EUA). This EUA will remain  in effect (meaning this test can be used) for the duration of the COVID-19 declaration under Section  564(b)(1) of the Act, 21 U.S.C.section 360bbb-3(b)(1), unless the authorization is terminated  or revoked sooner.       Influenza A by PCR NEGATIVE NEGATIVE Final   Influenza B by PCR NEGATIVE NEGATIVE Final    Comment: (NOTE) The Xpert Xpress SARS-CoV-2/FLU/RSV plus assay is intended as an aid in the diagnosis of influenza from Nasopharyngeal swab specimens and should not be used as a sole basis for treatment. Nasal washings and aspirates are unacceptable for Xpert Xpress SARS-CoV-2/FLU/RSV testing.  Fact Sheet for Patients: BloggerCourse.com  Fact Sheet for Healthcare Providers: SeriousBroker.it  This test is not yet approved or cleared by the [place] FDA and has been authorized for detection and/or diagnosis of SARS-CoV-2 by FDA under an Emergency Use Authorization (EUA). This EUA will remain in effect (meaning this test can be used) for the duration of the COVID-19 declaration under Section 564(b)(1) of the Act, 21 U.S.C. section 360bbb-3(b)(1), unless the authorization is terminated or revoked.  Performed at [facility], [address] [place], [place] [address]   Blood Culture (routine x 2)     Status: None (Preliminary result)   Collection Time: 04/02/20  9:50 PM   Specimen: BLOOD  Result Value Ref Range Status   Specimen Description   Final    BLOOD LEFT ARM Performed at [facility], [address] [place], [place] [address]    Special Requests   Final    BOTTLES DRAWN AEROBIC AND ANAEROBIC Blood Culture results may not be optimal due to an inadequate volume of blood received in culture bottles Performed at [facility], [address], [place], [place] [address]    Culture   Final    NO GROWTH 4 DAYS Performed at [facility], [address], [place], [place] [address]    Report Status PENDING  Incomplete   Time spent:   SIGNED:   Rickey Barbara, MD  [facility] 04/07/2020, 5:32 PM  If 7PM-7AM, please contact night-coverage

## 2020-04-07 NOTE — Progress Notes (Signed)
RN reviewed discharge instructions with pt, pt's aunt, and pt's niece per pt's request. All questions addressed. Pt and family requested phone number for behavioral health and alcohol [facility] for rehab and assisting pt to quick drugs and alcohol per pt and family. Phone numbers provided. IV and tele removed. No further needs at this time. Pt being discharged with aunt in private vehicle in stable condition.

## 2020-04-07 NOTE — H&P (Deleted)
Physician Discharge Summary  Wong Steadham [name] [place] WUX:[id number] DOB: 09-09-1980 DOA: 04/02/2020  PCP: [name], MD  Admit date: 04/02/2020 Discharge date: 04/07/2020  Admitted From: Home Disposition:  Home  Recommendations for Outpatient Follow-up:  1. Follow up with PCP in 2-3 weeks  Discharge Condition:Improved CODE STATUS:Full Diet recommendation: Regular   Brief/Interim Summary: 39 y.o.malewith medical history significant ofalcohol use, alcoholic liver cirrhosis, history of withdrawal, history of anemia, and hypoalbuminemia who presents with weakness, lethargy, confusion.Patient was admitted for concerns of pneumonia with sepsis  Discharge Diagnoses:  Principal Problem:   Severe sepsis (HCC) Active Problems:   ETOH abuse   Thrombocytopenia (HCC)   Alcoholic cirrhosis of liver without ascites (HCC)   Pneumonia  Severe sepsis with Pneumonia present on admit >Patient meets criteria for sepsis with tachycardia in the 100s, tachypnea in the 20s, leukocytosis to 11.3, fever to 102. Severe sepsis considering lactic acid elevated at 5.1. >Likely source is his right upper lobe pneumonia. -Pt had been continued on IV fluids -Pt had been continued on empiric azithromycin and rocephin, pt to complete 5 days of tx -lactate had trended down  Alcoholic cirrhosis >Patient has known alcoholic cirrhosis from chronic alcohol use. >Alcohol level was negative in the ED >Pt noted to be thrombocytopenic, see below >Meld score 24 at time of presentation -Lasix and spironolactone were held in the setting of severe sepsis -Patient was continued on thiamine and folate -RUQ reviewed, findings consistent with known hx of cirrhosis -Ammonia has improved  Toxic metabolic encephalopathy secondary to Hepatic encephalopathy and ETOH withdrawal -ammonia peaked to 57, resolved after receiving lactulose -Patient was continued with CIWA per below  Thrombocytopenia -Patient  has baseline thrombocytopenia -likely worsened secondary to presenting sepsis -Avoid anticoagulation/antiplatelet unless necessary -Platelets are trending up  Hx ETOH abuse with acute ETOH withdrawals  -Initially believed last ETOH was around 2 weeks prior to admit -Family reports not being sure about pt's true ETOH hisotry -ETOH level at presentation is neg -Developed ETOH withdrawals with CIWA peaking to 21, now improved with ativan per CIWA protocol -Head CT was ordered given acute mental status change, reviewed and was neg -Cont CIWA. Will need cessation -Of note, chart reviewed and pt has a long hx of ETOH related issues in the past including ETOH related seizures as well as treatment by behavioral health for ETOH problems  Acute toxic metabolic encephalopathy  Discharge Instructions   Allergies as of 04/07/2020   No Known Allergies     Medication List    STOP taking these medications   naltrexone 50 MG tablet Commonly known as: DEPADE     TAKE these medications   cefdinir 300 MG capsule Commonly known as: OMNICEF Take 1 capsule (300 mg total) by mouth 2 (two) times daily for 1 day.   folic acid 1 MG tablet Commonly known as: FOLVITE Take 1 tablet (1 mg total) by mouth daily.   furosemide 40 MG tablet Commonly known as: Lasix Take 1 tablet (40 mg total) by mouth daily as needed for edema.   spironolactone 100 MG tablet Commonly known as: ALDACTONE Take 1 tablet (100 mg total) by mouth daily.   thiamine 100 MG tablet Take 1 tablet (100 mg total) by mouth daily.   Vitamin D (Ergocalciferol) 1.25 MG (50000 UNIT) Caps capsule Commonly known as: DRISDOL Take 1 capsule (50,000 Units total) by mouth every 7 (seven) days. What changed: additional instructions       Follow-up Information    Shan Levans  E, MD. Schedule an appointment as soon as possible for a visit in 2 week(s).   Specialty: Pulmonary Disease Contact information: [address] [place] [id number] [phone]              No Known Allergies   Procedures/Studies: DG Abdomen 1 View  Result Date: 04/02/2020 CLINICAL DATA:  Generalized weakness and lethargy for 2 days, vomiting EXAM: ABDOMEN - 1 VIEW COMPARISON:  None. FINDINGS: Two supine frontal views of the abdomen and pelvis are obtained. Moderate gaseous distention of the colon. No evidence of small-bowel obstruction or ileus. No masses or abnormal calcifications. No acute bony abnormalities. IMPRESSION: 1. No evidence of bowel obstruction or ileus. Moderate gas distention of the colon. Electronically Signed   By: Sharlet SalinaMichael  Brown M.D.   On: 04/02/2020 21:13   CT HEAD WO CONTRAST  Result Date: 04/04/2020 CLINICAL DATA:  Head trauma EXAM: CT HEAD WITHOUT CONTRAST TECHNIQUE: Contiguous axial images were obtained from the base of the skull through the vertex without intravenous contrast. COMPARISON:  Head CT 07/09/2016 FINDINGS: Brain: There is no mass, hemorrhage or extra-axial collection. The size and configuration of the ventricles and extra-axial CSF spaces are normal. The brain parenchyma is normal, without acute or chronic infarction. Vascular: No abnormal hyperdensity of the major intracranial arteries or dural venous sinuses. No intracranial atherosclerosis. Skull: The visualized skull base, calvarium and extracranial soft tissues are normal. Sinuses/Orbits: No fluid levels or advanced mucosal thickening of the visualized paranasal sinuses. No mastoid or middle ear effusion. The orbits are normal. IMPRESSION: Normal head CT. Electronically Signed   By: Deatra RobinsonKevin  Herman M.D.   On: 04/04/2020 19:58   DG Chest Port 1 View  Result Date: 04/02/2020 CLINICAL DATA:  Weakness, lethargy for 2 days, emesis EXAM: PORTABLE CHEST 1 VIEW COMPARISON:  09/03/2019 FINDINGS: Single frontal view of the chest demonstrates an unremarkable cardiac silhouette. Increased density right upper hemithorax consistent with underlying  airspace disease and/or loculated effusion. No effusion or pneumothorax. No acute bony abnormalities. IMPRESSION: Right upper lobe consolidation and/or loculated effusion. Findings are consistent with underlying pneumonia. Electronically Signed   By: Sharlet SalinaMichael  Brown M.D.   On: 04/02/2020 21:16   US Abdomen Limited RUQ (LIVER/GB)  Result Date: 04/03/2020 CLINICAL DATA:  Elevated liver function tests. EXAM: ULTRASOUND ABDOMEN LIMITED RIGHT UPPER QUADRANT COMPARISON:  March 14, 2018. FINDINGS: Gallbladder: No cholelithiasis is noted. Mild to moderate gallbladder wall thickening is noted at approximately 5 mm. No sonographic Murphy's sign is noted. No pericholecystic fluid is noted. Common bile duct: Diameter: 6 mm which is within normal limits. Liver: No focal lesion identified. Increased echogenicity of hepatic parenchyma is noted with slightly nodular hepatic contours suggesting possible hepatic cirrhosis. Portal vein is patent on color Doppler imaging with normal direction of blood flow towards the liver. Other: None. IMPRESSION: Increased echogenicity of hepatic parenchyma is noted with slightly nodular hepatic contours suggesting of hepatic cirrhosis. No definite focal sonographic hepatic abnormality is noted. Mild to moderate gallbladder wall thickening is noted which most likely is due to adjacent hepatocellular disease. No definite cholelithiasis is noted. Electronically Signed   By: Lupita RaiderJames  Green Jr M.D.   On: 04/03/2020 12:49     Subjective: Very eager to go home  Discharge Exam: Vitals:   04/07/20 0839 04/07/20 1215  BP: 106/65 105/62  Pulse: 75 81  Resp:  16  Temp: 99.5 F (37.5 C) 98 F (36.7 C)  SpO2: 97% 98%   Vitals:   04/06/20 2131  04/07/20 0627 04/07/20 0839 04/07/20 1215  BP: 138/79 131/81 106/65 105/62  Pulse: 98 92 75 81  Resp: 20 (!) 24  16  Temp: 100.3 F (37.9 C) (!) 102.5 F (39.2 C) 99.5 F (37.5 C) 98 F (36.7 C)  TempSrc:  Oral Oral   SpO2: 97% 98% 97% 98%   Weight:  77.9 kg    Height:        General: Pt is alert, awake, not in acute distress Cardiovascular: RRR, S1/S2 +, no rubs, no gallops Respiratory: CTA bilaterally, no wheezing, no rhonchi Abdominal: Soft, NT, ND, bowel sounds + Extremities: no edema, no cyanosis   The results of significant diagnostics from this hospitalization (including imaging, microbiology, ancillary and laboratory) are listed below for reference.     Microbiology: Recent Results (from the past 240 hour(s))  Blood Culture (routine x 2)     Status: Abnormal   Collection Time: 04/02/20  8:26 PM   Specimen: BLOOD  Result Value Ref Range Status   Specimen Description   Final    BLOOD RIGHT ARM Performed at [facility], [address] [place], [place] [address]    Special Requests   Final    BOTTLES DRAWN AEROBIC AND ANAEROBIC Blood Culture adequate volume Performed at [facility], [address], [place], [place] [address]    Culture  Setup Time   Final    GRAM POSITIVE COCCI IN CLUSTERS IN BOTH AEROBIC AND ANAEROBIC BOTTLES Organism ID to follow CRITICAL RESULT CALLED TO, READ BACK BY AND VERIFIED WITH: Janine Limbo PHARMD [id number] 04/03/20 A BROWNING    Culture (A)  Final    STAPHYLOCOCCUS EPIDERMIDIS THE SIGNIFICANCE OF ISOLATING THIS ORGANISM FROM A SINGLE SET OF BLOOD CULTURES WHEN MULTIPLE SETS ARE DRAWN IS UNCERTAIN. PLEASE NOTIFY THE MICROBIOLOGY DEPARTMENT WITHIN ONE WEEK IF SPECIATION AND SENSITIVITIES ARE REQUIRED. Performed at [facility], [address], [place], [place] [address]    Report Status 04/05/2020 FINAL  Final  Blood Culture ID Panel (Reflexed)     Status: Abnormal   Collection Time: 04/02/20  8:26 PM  Result Value Ref Range Status   Enterococcus faecalis NOT DETECTED NOT DETECTED Final   Enterococcus Faecium NOT DETECTED NOT DETECTED Final   Listeria monocytogenes NOT DETECTED NOT DETECTED Final   Staphylococcus species DETECTED (A) NOT  DETECTED Final    Comment: CRITICAL RESULT CALLED TO, READ BACK BY AND VERIFIED WITH: Janine Limbo PHARMD [id number] 04/03/20 A BROWNING    Staphylococcus aureus (BCID) NOT DETECTED NOT DETECTED Final   Staphylococcus epidermidis DETECTED (A) NOT DETECTED Final    Comment: CRITICAL RESULT CALLED TO, READ BACK BY AND VERIFIED WITH: Janine Limbo PHARMD [id number] 04/03/20 A BROWNING    Staphylococcus lugdunensis NOT DETECTED NOT DETECTED Final   Streptococcus species NOT DETECTED NOT DETECTED Final   Streptococcus agalactiae NOT DETECTED NOT DETECTED Final   Streptococcus pneumoniae NOT DETECTED NOT DETECTED Final   Streptococcus pyogenes NOT DETECTED NOT DETECTED Final   A.calcoaceticus-baumannii NOT DETECTED NOT DETECTED Final   Bacteroides fragilis NOT DETECTED NOT DETECTED Final   Enterobacterales NOT DETECTED NOT DETECTED Final   Enterobacter cloacae complex NOT DETECTED NOT DETECTED Final   Escherichia coli NOT DETECTED NOT DETECTED Final   Klebsiella aerogenes NOT DETECTED NOT DETECTED Final   Klebsiella oxytoca NOT DETECTED NOT DETECTED Final   Klebsiella pneumoniae NOT DETECTED NOT DETECTED Final   Proteus species NOT DETECTED NOT DETECTED Final   Salmonella species NOT  DETECTED NOT DETECTED Final   Serratia marcescens NOT DETECTED NOT DETECTED Final   Haemophilus influenzae NOT DETECTED NOT DETECTED Final   Neisseria meningitidis NOT DETECTED NOT DETECTED Final   Pseudomonas aeruginosa NOT DETECTED NOT DETECTED Final   Stenotrophomonas maltophilia NOT DETECTED NOT DETECTED Final   Candida albicans NOT DETECTED NOT DETECTED Final   Candida auris NOT DETECTED NOT DETECTED Final   Candida glabrata NOT DETECTED NOT DETECTED Final   Candida krusei NOT DETECTED NOT DETECTED Final   Candida parapsilosis NOT DETECTED NOT DETECTED Final   Candida tropicalis NOT DETECTED NOT DETECTED Final   Cryptococcus neoformans/gattii NOT DETECTED NOT DETECTED Final   Methicillin resistance mecA/C NOT DETECTED NOT  DETECTED Final    Comment: Performed at [facility], [address] [place], [place] [address]  Resp Panel by RT-PCR (Flu A&B, Covid) Nasopharyngeal Swab     Status: None   Collection Time: 04/02/20  9:45 PM   Specimen: Nasopharyngeal Swab; Nasopharyngeal(NP) swabs in vial transport medium  Result Value Ref Range Status   SARS Coronavirus 2 by RT PCR NEGATIVE NEGATIVE Final    Comment: (NOTE) SARS-CoV-2 target nucleic acids are NOT DETECTED.  The SARS-CoV-2 RNA is generally detectable in upper respiratory specimens during the acute phase of infection. The lowest concentration of SARS-CoV-2 viral copies this assay can detect is 138 copies/mL. A negative result does not preclude SARS-Cov-2 infection and should not be used as the sole basis for treatment or other patient management decisions. A negative result may occur with  improper specimen collection/handling, submission of specimen other than nasopharyngeal swab, presence of viral mutation(s) within the areas targeted by this assay, and inadequate number of viral copies(<138 copies/mL). A negative result must be combined with clinical observations, patient history, and epidemiological information. The expected result is Negative.  Fact Sheet for Patients:  BloggerCourse.com  Fact Sheet for Healthcare Providers:  SeriousBroker.it  This test is no t yet approved or cleared by the [place] FDA and  has been authorized for detection and/or diagnosis of SARS-CoV-2 by FDA under an Emergency Use Authorization (EUA). This EUA will remain  in effect (meaning this test can be used) for the duration of the COVID-19 declaration under Section 564(b)(1) of the Act, 21 U.S.C.section 360bbb-3(b)(1), unless the authorization is terminated  or revoked sooner.       Influenza A by PCR NEGATIVE NEGATIVE Final   Influenza B by PCR NEGATIVE NEGATIVE Final    Comment: (NOTE) The Xpert  Xpress SARS-CoV-2/FLU/RSV plus assay is intended as an aid in the diagnosis of influenza from Nasopharyngeal swab specimens and should not be used as a sole basis for treatment. Nasal washings and aspirates are unacceptable for Xpert Xpress SARS-CoV-2/FLU/RSV testing.  Fact Sheet for Patients: BloggerCourse.com  Fact Sheet for Healthcare Providers: SeriousBroker.it  This test is not yet approved or cleared by the [place] FDA and has been authorized for detection and/or diagnosis of SARS-CoV-2 by FDA under an Emergency Use Authorization (EUA). This EUA will remain in effect (meaning this test can be used) for the duration of the COVID-19 declaration under Section 564(b)(1) of the Act, 21 U.S.C. section 360bbb-3(b)(1), unless the authorization is terminated or revoked.  Performed at [facility], [address] [place], [place] [address]   Blood Culture (routine x 2)     Status: None (Preliminary result)   Collection Time: 04/02/20  9:50 PM   Specimen: BLOOD  Result Value Ref Range Status  Specimen Description   Final    BLOOD LEFT ARM Performed at [facility], [address] [place], [place] [address]    Special Requests   Final    BOTTLES DRAWN AEROBIC AND ANAEROBIC Blood Culture results may not be optimal due to an inadequate volume of blood received in culture bottles Performed at [facility], [address] [place], [place] [address]    Culture   Final    NO GROWTH 4 DAYS Performed at [facility], [address] [place], [place] [address]    Report Status PENDING  Incomplete     Labs: BNP (last 3 results) No results for input(s): BNP in the last 8760 hours. Basic Metabolic Panel: Recent Labs  Lab 04/03/20 0009 04/03/20 0330 04/04/20 0432 04/04/20 1050 04/05/20 0413 04/05/20 0828 04/06/20 0345 04/07/20 0436  NA  --    < > 131*  --   134* 133* 137 133*  K  --    < > 2.8*  --  3.2* 3.1* 3.3* 3.9  CL  --    < > 107  --  111 111 116* 112*  CO2  --    < > 20*  --  18* 16* 17* 17*  GLUCOSE  --    < > 92  --  79 78 80 121*  BUN  --    < > 13  --  CREATININE  --    < > 0.50*  --  0.54* 0.53* 0.61 0.53*  CALCIUM  --    < > 7.4*  --  7.5* 7.7* 7.4* 7.2*  MG 1.3*  --   --  2.0 1.9 1.9 1.7 1.6*  PHOS 2.0*  --   --   --   --  2.2*  --   --    < > = values in this interval not displayed.   Liver Function Tests: Recent Labs  Lab 04/04/20 0432 04/05/20 0413 04/05/20 0828 04/06/20 0345 04/07/20 0436  AST 80* 71* 72* 67* 63*  ALT ALKPHOS 49 59 57 59 86  BILITOT 2.8* 3.0* 3.3* 3.2* 2.1*  PROT 5.3* 5.3* 5.6* 5.3* 5.4*  ALBUMIN 1.7* 1.7* 1.8* 1.7* 1.7*   No results for input(s): LIPASE, AMYLASE in the last 168 hours. Recent Labs  Lab 04/02/20 2200 04/04/20 1250 04/05/20 0413 04/06/20 0345 04/07/20 0436  AMMONIA 57* 30 49* 26 44*   CBC: Recent Labs  Lab 04/02/20 2026 04/03/20 0009 04/04/20 1050 04/05/20 0413 04/05/20 0828 04/06/20 0345 04/07/20 0436  WBC 11.3*   < > 5.5 3.1* 3.0* 3.3* 4.0  NEUTROABS 10.5*  --   --   --   --   --   --   HGB 8.4*   < > 9.0* 7.5* 7.5* 7.8* 7.8*  HCT 26.9*   < > 29.4* 24.0* 24.1* 25.5* 24.9*  MCV 87.9   < > 87.5 87.0 88.6 89.5 89.6  PLT 36*   < > 32* 32* 35* 43* 55*   < > = values in this interval not displayed.   Cardiac Enzymes: No results for input(s): CKTOTAL, CKMB, CKMBINDEX, TROPONINI in the last 168 hours. BNP: Invalid input(s): POCBNP CBG: No results for input(s): GLUCAP in the last 168 hours. D-Dimer No results for input(s): DDIMER in the last 72 hours. Hgb A1c No results for input(s): HGBA1C in the last 72 hours. Lipid Profile No results for  input(s): CHOL, HDL, LDLCALC, TRIG, CHOLHDL, LDLDIRECT in the last 72 hours. Thyroid function studies No results for input(s): TSH, T4TOTAL, T3FREE, THYROIDAB in the last 72 hours.  Invalid  input(s): FREET3 Anemia work up No results for input(s): VITAMINB12, FOLATE, FERRITIN, TIBC, IRON, RETICCTPCT in the last 72 hours. Urinalysis    Component Value Date/Time   COLORURINE AMBER (A) 04/04/2020 1908   APPEARANCEUR CLEAR 04/04/2020 1908   LABSPEC 1.025 04/04/2020 1908   PHURINE 6.0 04/04/2020 1908   GLUCOSEU NEGATIVE 04/04/2020 1908   HGBUR SMALL (A) 04/04/2020 1908   BILIRUBINUR NEGATIVE 04/04/2020 1908   KETONESUR NEGATIVE 04/04/2020 1908   PROTEINUR NEGATIVE 04/04/2020 1908   UROBILINOGEN 0.2 08/02/2014 2045   NITRITE NEGATIVE 04/04/2020 1908   LEUKOCYTESUR NEGATIVE 04/04/2020 1908   Sepsis Labs Invalid input(s): PROCALCITONIN,  WBC,  LACTICIDVEN Microbiology Recent Results (from the past 240 hour(s))  Blood Culture (routine x 2)     Status: Abnormal   Collection Time: 04/02/20  8:26 PM   Specimen: BLOOD  Result Value Ref Range Status   Specimen Description   Final    BLOOD RIGHT ARM Performed at [facility], [address], [place], [place] [address]    Special Requests   Final    BOTTLES DRAWN AEROBIC AND ANAEROBIC Blood Culture adequate volume Performed at [facility], [address] [place], [place] [address]    Culture  Setup Time   Final    GRAM POSITIVE COCCI IN CLUSTERS IN BOTH AEROBIC AND ANAEROBIC BOTTLES Organism ID to follow CRITICAL RESULT CALLED TO, READ BACK BY AND VERIFIED WITH: Janine Limbo PHARMD [id number] 04/03/20 A BROWNING    Culture (A)  Final    STAPHYLOCOCCUS EPIDERMIDIS THE SIGNIFICANCE OF ISOLATING THIS ORGANISM FROM A SINGLE SET OF BLOOD CULTURES WHEN MULTIPLE SETS ARE DRAWN IS UNCERTAIN. PLEASE NOTIFY THE MICROBIOLOGY DEPARTMENT WITHIN ONE WEEK IF SPECIATION AND SENSITIVITIES ARE REQUIRED. Performed at [facility], [address] [place], [place] [address]    Report Status 04/05/2020 FINAL  Final  Blood Culture ID Panel (Reflexed)     Status: Abnormal   Collection Time: 04/02/20  8:26 PM   Result Value Ref Range Status   Enterococcus faecalis NOT DETECTED NOT DETECTED Final   Enterococcus Faecium NOT DETECTED NOT DETECTED Final   Listeria monocytogenes NOT DETECTED NOT DETECTED Final   Staphylococcus species DETECTED (A) NOT DETECTED Final    Comment: CRITICAL RESULT CALLED TO, READ BACK BY AND VERIFIED WITH: Janine Limbo PHARMD [id number] 04/03/20 A BROWNING    Staphylococcus aureus (BCID) NOT DETECTED NOT DETECTED Final   Staphylococcus epidermidis DETECTED (A) NOT DETECTED Final    Comment: CRITICAL RESULT CALLED TO, READ BACK BY AND VERIFIED WITH: Janine Limbo PHARMD [id number] 04/03/20 A BROWNING    Staphylococcus lugdunensis NOT DETECTED NOT DETECTED Final   Streptococcus species NOT DETECTED NOT DETECTED Final   Streptococcus agalactiae NOT DETECTED NOT DETECTED Final   Streptococcus pneumoniae NOT DETECTED NOT DETECTED Final   Streptococcus pyogenes NOT DETECTED NOT DETECTED Final   A.calcoaceticus-baumannii NOT DETECTED NOT DETECTED Final   Bacteroides fragilis NOT DETECTED NOT DETECTED Final   Enterobacterales NOT DETECTED NOT DETECTED Final   Enterobacter cloacae complex NOT DETECTED NOT DETECTED Final   Escherichia coli NOT DETECTED NOT DETECTED Final   Klebsiella aerogenes NOT DETECTED NOT DETECTED Final   Klebsiella oxytoca NOT DETECTED NOT DETECTED Final   Klebsiella pneumoniae NOT DETECTED NOT DETECTED Final   Proteus species NOT  DETECTED NOT DETECTED Final   Salmonella species NOT DETECTED NOT DETECTED Final   Serratia marcescens NOT DETECTED NOT DETECTED Final   Haemophilus influenzae NOT DETECTED NOT DETECTED Final   Neisseria meningitidis NOT DETECTED NOT DETECTED Final   Pseudomonas aeruginosa NOT DETECTED NOT DETECTED Final   Stenotrophomonas maltophilia NOT DETECTED NOT DETECTED Final   Candida albicans NOT DETECTED NOT DETECTED Final   Candida auris NOT DETECTED NOT DETECTED Final   Candida glabrata NOT DETECTED NOT DETECTED Final   Candida krusei NOT DETECTED  NOT DETECTED Final   Candida parapsilosis NOT DETECTED NOT DETECTED Final   Candida tropicalis NOT DETECTED NOT DETECTED Final   Cryptococcus neoformans/gattii NOT DETECTED NOT DETECTED Final   Methicillin resistance mecA/C NOT DETECTED NOT DETECTED Final    Comment: Performed at [facility], [address] [place], [place] [address]  Resp Panel by RT-PCR (Flu A&B, Covid) Nasopharyngeal Swab     Status: None   Collection Time: 04/02/20  9:45 PM   Specimen: Nasopharyngeal Swab; Nasopharyngeal(NP) swabs in vial transport medium  Result Value Ref Range Status   SARS Coronavirus 2 by RT PCR NEGATIVE NEGATIVE Final    Comment: (NOTE) SARS-CoV-2 target nucleic acids are NOT DETECTED.  The SARS-CoV-2 RNA is generally detectable in upper respiratory specimens during the acute phase of infection. The lowest concentration of SARS-CoV-2 viral copies this assay can detect is 138 copies/mL. A negative result does not preclude SARS-Cov-2 infection and should not be used as the sole basis for treatment or other patient management decisions. A negative result may occur with  improper specimen collection/handling, submission of specimen other than nasopharyngeal swab, presence of viral mutation(s) within the areas targeted by this assay, and inadequate number of viral copies(<138 copies/mL). A negative result must be combined with clinical observations, patient history, and epidemiological information. The expected result is Negative.  Fact Sheet for Patients:  BloggerCourse.com  Fact Sheet for Healthcare Providers:  SeriousBroker.it  This test is no t yet approved or cleared by the [place] FDA and  has been authorized for detection and/or diagnosis of SARS-CoV-2 by FDA under an Emergency Use Authorization (EUA). This EUA will remain  in effect (meaning this test can be used) for the duration of the COVID-19 declaration under Section  564(b)(1) of the Act, 21 U.S.C.section 360bbb-3(b)(1), unless the authorization is terminated  or revoked sooner.       Influenza A by PCR NEGATIVE NEGATIVE Final   Influenza B by PCR NEGATIVE NEGATIVE Final    Comment: (NOTE) The Xpert Xpress SARS-CoV-2/FLU/RSV plus assay is intended as an aid in the diagnosis of influenza from Nasopharyngeal swab specimens and should not be used as a sole basis for treatment. Nasal washings and aspirates are unacceptable for Xpert Xpress SARS-CoV-2/FLU/RSV testing.  Fact Sheet for Patients: BloggerCourse.com  Fact Sheet for Healthcare Providers: SeriousBroker.it  This test is not yet approved or cleared by the [place] FDA and has been authorized for detection and/or diagnosis of SARS-CoV-2 by FDA under an Emergency Use Authorization (EUA). This EUA will remain in effect (meaning this test can be used) for the duration of the COVID-19 declaration under Section 564(b)(1) of the Act, 21 U.S.C. section 360bbb-3(b)(1), unless the authorization is terminated or revoked.  Performed at [facility], [address], [place], [place] [address]   Blood Culture (routine x 2)     Status: None (Preliminary result)   Collection Time: 04/02/20  9:50 PM   Specimen:  BLOOD  Result Value Ref Range Status   Specimen Description   Final    BLOOD LEFT ARM Performed at [facility], [address], [place], [place] [address]    Special Requests   Final    BOTTLES DRAWN AEROBIC AND ANAEROBIC Blood Culture results may not be optimal due to an inadequate volume of blood received in culture bottles Performed at [facility], [address], [place], [place] [address]    Culture   Final    NO GROWTH 4 DAYS Performed at [facility], [address] [place], [place] [address]    Report Status PENDING  Incomplete   Time spent:   SIGNED:   Rickey Barbara, MD  [facility] 04/07/2020, 12:46 PM  If 7PM-7AM, please contact night-coverage

## 2020-04-08 ENCOUNTER — Telehealth: Payer: Self-pay

## 2020-04-08 LAB — CULTURE, BLOOD (ROUTINE X 2): Culture: NO GROWTH

## 2020-04-08 NOTE — Telephone Encounter (Signed)
Transition Care Management Unsuccessful Follow-up Telephone Call  Call placed with assistance of Spanish Interpreter # [phone] /[employer]  Date of discharge and from where:  04/07/2020, [facility]   Attempts:  1st Attempt  Reason for unsuccessful TCM follow-up call:  Unable to reach patient - # [phone] voicemail not set up.   Need to discuss scheduling a hospital follow up appointment at [facility]

## 2020-04-09 ENCOUNTER — Telehealth: Payer: Self-pay

## 2020-04-09 NOTE — Telephone Encounter (Signed)
Transition Care Management Follow-up Telephone Call  Call placed with assistance of Spanish Interpreter # [phone]/[employer].  The interpreter had to ask the questions multiple times to get an answer from the patient.   Date of discharge and from where: 04/07/2020, [facility]   How have you been since you were released from the hospital? "good"  Any questions or concerns? No  Items Reviewed:  Did the pt receive and understand the discharge instructions provided? Yes   Medications obtained and verified? Yes - he said he has all medications including the new antibiotic  Other? No   Any new allergies since your discharge? No   Do you have support at home? he said he lives alone and has no family or friends  Home Care and Equipment/Supplies: Were home health services ordered? no If so, what is the name of the agency? n/a  Has the agency set up a time to come to the patient's home? not applicable Were any new equipment or medical supplies ordered?  No What is the name of the medical supply agency? n/a Were you able to get the supplies/equipment? not applicable Do you have any questions related to the use of the equipment or supplies? No  Functional Questionnaire: (I = Independent and D = Dependent) ADLs: Independent    Follow up appointments reviewed:   PCP Hospital f/u appt confirmed? he has an appointment with Dr Wright6/22/2022 and he did not want to schedule an appointment to be seen sooner. He said he would call the office if he changes his mind  Specialist Hospital f/u appt confirmed? Yes - hematology - 04/15/2020.    Are transportation arrangements needed? he would need to ask someone for a ride or have transportation scheduled.  If their condition worsens, is the pt aware to call PCP or go to the Emergency Dept.? Yes  Was the patient provided with contact information for the PCP's office or ED? Yes  Was to pt encouraged to call back with questions  or concerns? Yes

## 2020-04-14 ENCOUNTER — Telehealth: Payer: Self-pay | Admitting: Critical Care Medicine

## 2020-04-14 NOTE — Progress Notes (Signed)
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 Leonides Schanz   CHIEF COMPLAINTS/PURPOSE OF CONSULTATION:  "Thrombocytopenia "  HISTORY OF PRESENTING ILLNESS:  Mathew [name] 40 y.o. [name] with medical history significant for ETOH abuse/cirrhosis who presents for evaluation of thrombocytopenia.   On review of the previous records Mathew [name] has a longstanding history of thrombocytopenia dating back to at least 10/16/2015.  At that time had a white blood cell count of 4.7, hemoglobin 12.7, and platelet count 95.  He reached a local minimum on 12/19/2019 at which time he had a platelet count of 39.  He subsequently dropped as low as a platelet count of 24 on 04/03/2020.  He made a slight recovery during his hospitalization up to a platelet count of 55 on 04/07/2020.  Given this patient's longstanding chronic thrombocytopenia in the setting of alcohol abuse he was referred to hematology for further evaluation management.  On exam today Mrs. [name]'s medication is aided by a hospital improved interpreter via iPad.  He notes that he has not had any issues with bleeding, dark stools, or other overt signs of bleeding.  He does have occasional bruising on his extremities which she notes is a chronic issue.  He notes he has not been having any rare blood in his stool.  He endorses having rare shortness of breath and his energy levels are good.  He denies having ever seen a liver doctor before in the past.  He  reports he eats an unrestricted diet.  On further discussion his family history is remarkable for elevated blood pressure and his mother and no other clear bleeding or bruising conditions.  He is a former smoker and quit 3 months ago and quit alcohol approximately 3 months ago.  He notes that he drank "too much".  When asked to elaborate he is unable to provide details on drink of choice or amount of drinking he was doing.  He currently works odd jobs such as Holiday representative, Publishing copy, or as a Surveyor, minerals.  He notes that occasionally he walks out of the sun and feels dizzy but otherwise has been at his baseline level of health.  He notes that he did have some yellowing in his skin during his last hospitalization but this has been steadily improving.  He currently denies any fevers, chills, sweats, nausea, vomiting or diarrhea.  A full 10 point ROS is listed below.  MEDICAL HISTORY:  Past Medical History:  Diagnosis Date  . Alcohol withdrawal seizure (HCC)   . ETOH abuse     SURGICAL HISTORY: Past Surgical History:  Procedure Laterality Date  . COLONOSCOPY WITH PROPOFOL N/A 03/17/2018   Procedure: COLONOSCOPY WITH PROPOFOL;  Surgeon: Bernette Redbird, MD;  Location: [facility] ENDOSCOPY;  Service: Endoscopy;  Laterality: N/A;  . ESOPHAGOGASTRODUODENOSCOPY (EGD) WITH PROPOFOL N/A 03/17/2018   Procedure: ESOPHAGOGASTRODUODENOSCOPY (EGD) WITH PROPOFOL;  Surgeon: Bernette Redbird, MD;  Location: [facility] ENDOSCOPY;  Service: Endoscopy;  Laterality: N/A;  . IR PARACENTESIS  03/14/2018    SOCIAL HISTORY: Social History   Socioeconomic History  .  Marital status: Significant Other    Spouse name: Not on file  . Number of children: Not on file  . Years of education: Not on file  . Highest education level: Not on file  Occupational History  . Not on file  Tobacco Use  . Smoking status: Current Every Day Smoker    Packs/day: 0.25    Types: Cigarettes    Start date: 08/11/2019  . Smokeless tobacco: Never Used  Vaping  Use  . Vaping Use: Never used  Substance and Sexual Activity  . Alcohol use: Not Currently    Comment: last beer 2x weeks ago  . Drug use: No  . Sexual activity: Not Currently  Other Topics Concern  . Not on file  Social History Narrative  . Not on file   Social Determinants of Health   Financial Resource Strain: Not on file  Food Insecurity: Not on file  Transportation Needs: Not on file  Physical Activity: Not on file  Stress: Not on file  Social Connections: Not on file  Intimate Partner Violence: Not on file    FAMILY HISTORY: Family History  Problem Relation Age of Onset  . Cancer Neg Hx   . Heart disease Neg Hx     ALLERGIES:  has No Known Allergies.  MEDICATIONS:  Current Outpatient Medications  Medication Sig Dispense Refill  . folic acid (FOLVITE) 1 MG tablet TAKE 1 TABLET (1 MG TOTAL) BY MOUTH DAILY. 60 tablet 3  . furosemide (LASIX) 40 MG tablet TAKE 1 TABLET (40 MG TOTAL) BY MOUTH DAILY AS NEEDED FOR EDEMA. 40 tablet 3  . spironolactone (ALDACTONE) 100 MG tablet TAKE 1 TABLET (100 MG TOTAL) BY MOUTH DAILY. 60 tablet 2  . thiamine 100 MG tablet TAKE 1 TABLET (100 MG TOTAL) BY MOUTH DAILY. 60 tablet 2  . Vitamin D, Ergocalciferol, (DRISDOL) 1.25 MG (50000 UNIT) CAPS capsule TAKE 1 CAPSULE (50,000 UNITS TOTAL) BY MOUTH EVERY 7 (SEVEN) DAYS. (Patient taking differently: Take 50,000 Units by mouth every 7 (seven) days. Thurs/ Fri) 12 capsule 2   No current facility-administered medications for this visit.    REVIEW OF SYSTEMS:   Constitutional: ( - ) fevers, ( - )  chills , ( - ) night sweats Eyes: ( - ) blurriness of vision, ( - ) double vision, ( - ) watery eyes Ears, nose, mouth, throat, and face: ( - ) mucositis, ( - ) sore throat Respiratory: ( - ) cough, ( - ) dyspnea, ( - ) wheezes Cardiovascular: ( - ) palpitation, ( - ) chest discomfort, ( - ) lower extremity swelling Gastrointestinal:  ( - ) nausea, ( - ) heartburn, ( - ) change in bowel  habits Skin: ( - ) abnormal skin rashes Lymphatics: ( - ) new lymphadenopathy, ( - ) easy bruising Neurological: ( - ) numbness, ( - ) tingling, ( - ) new weaknesses Behavioral/Psych: ( - ) mood change, ( - ) new changes  All other systems were reviewed with the patient and are negative.  PHYSICAL EXAMINATION:  Vitals:   04/15/20 1259  BP: (!) 103/59  Pulse: 74  Resp: 15  Temp: 98.7 F (37.1 C)  SpO2: 100%   Filed Weights   04/15/20 1259  Weight: 159 lb 3.2 oz (72.2 kg)    GENERAL: chronically ill appearing middle aged Hispanic [name] in NAD  SKIN: skin color, texture, turgor are normal, no rashes or significant lesions EYES: conjunctiva are pink and non-injected, sclera clear LUNGS: clear  to auscultation and percussion with normal breathing effort HEART: regular rate & rhythm and no murmurs and no lower extremity edema ABDOMEN: soft, non-tender, non-distended, normal bowel sounds. No frank ascites.  Musculoskeletal: no cyanosis of digits and no clubbing  PSYCH: alert & oriented x 3, fluent speech NEURO: no focal motor/sensory deficits  LABORATORY DATA:  I have reviewed the data as listed CBC Latest Ref Rng & Units 04/15/2020 04/07/2020 04/06/2020  WBC 4.0 - 10.5 K/uL 3.0(L) 4.0 3.3(L)  Hemoglobin 13.0 - 17.0 g/dL 2.1(J) 7.8(L) 7.8(L)  Hematocrit 39.0 - 52.0 % 29.5(L) 24.9(L) 25.5(L)  Platelets 150 - 400 K/uL 85(L) 55(L) 43(L)    CMP Latest Ref Rng & Units 04/15/2020 04/07/2020 04/06/2020  Glucose 70 - 99 mg/dL 941(D) 408(X) 80  BUN 6 - 20 mg/dL 6 9 14   Creatinine 0.61 - 1.24 mg/dL 4.48) 1.85(U  Sodium 135 - 145 mmol/L 132(L) 133(L) 137  Potassium 3.5 - 5.1 mmol/L 4.2 3.9 3.3(L)  Chloride 98 - 111 mmol/L 103 112(H) 116(H)  CO2 22 - 32 mmol/L 22 17(L) 17(L)  Calcium 8.9 - 10.3 mg/dL 7.5(L) 7.2(L) 7.4(L)  Total Protein 6.5 - 8.1 g/dL 7.5 5.4(L) 5.3(L)  Total Bilirubin 0.3 - 1.2 mg/dL 3.1(H) 2.1(H) 3.2(H)  Alkaline Phos 38 - 126 U/L 180(H) 86 59  AST 15 - 41 U/L 47(H)  63(H) 67(H)  ALT 0 - 44 U/L 29 26 28     RADIOGRAPHIC STUDIES: DG Abdomen 1 View  Result Date: 04/02/2020 CLINICAL DATA:  Generalized weakness and lethargy for 2 days, vomiting EXAM: ABDOMEN - 1 VIEW COMPARISON:  None. FINDINGS: Two supine frontal views of the abdomen and pelvis are obtained. Moderate gaseous distention of the colon. No evidence of small-bowel obstruction or ileus. No masses or abnormal calcifications. No acute bony abnormalities. IMPRESSION: 1. No evidence of bowel obstruction or ileus. Moderate gas distention of the colon. Electronically Signed   By: M.D.   On: 04/02/2020 21:13   CT HEAD WO CONTRAST  Result Date: 04/04/2020 CLINICAL DATA:  Head trauma EXAM: CT HEAD WITHOUT CONTRAST TECHNIQUE: Contiguous axial images were obtained from the base of the skull through the vertex without intravenous contrast. COMPARISON:  Head CT 07/09/2016 FINDINGS: Brain: There is no mass, hemorrhage or extra-axial collection. The size and configuration of the ventricles and extra-axial CSF spaces are normal. The brain parenchyma is normal, without acute or chronic infarction. Vascular: No abnormal hyperdensity of the major intracranial arteries or dural venous sinuses. No intracranial atherosclerosis. Skull: The visualized skull base, calvarium and extracranial soft tissues are normal. Sinuses/Orbits: No fluid levels or advanced mucosal thickening of the visualized paranasal sinuses. No mastoid or middle ear effusion. The orbits are normal. IMPRESSION: Normal head CT. Electronically Signed   By: 04/06/2020 M.D.   On: 04/04/2020 19:58   DG Chest Port 1 View  Result Date: 04/02/2020 CLINICAL DATA:  Weakness, lethargy for 2 days, emesis EXAM: PORTABLE CHEST 1 VIEW COMPARISON:  09/03/2019 FINDINGS: Single frontal view of the chest demonstrates an unremarkable cardiac silhouette. Increased density right upper hemithorax consistent with underlying airspace disease and/or loculated effusion.  No effusion or pneumothorax. No acute bony abnormalities. IMPRESSION: Right upper lobe consolidation and/or loculated effusion. Findings are consistent with underlying pneumonia. Electronically Signed   By: 04/04/2020 M.D.   On: 04/02/2020 21:16   Sharlet [place] Abdomen Limited RUQ (LIVER/GB)  Result Date: 04/03/2020 CLINICAL DATA:  Elevated liver function tests. EXAM: ULTRASOUND ABDOMEN LIMITED RIGHT UPPER QUADRANT COMPARISON:  March 14, 2018. FINDINGS: Gallbladder: No cholelithiasis is noted. Mild to moderate gallbladder wall thickening is noted at approximately 5 mm. No sonographic Murphy's sign is noted. No pericholecystic fluid is noted. Common bile duct: Diameter: 6 mm which is within normal limits. Liver: No focal lesion identified. Increased echogenicity of hepatic parenchyma is noted with slightly nodular hepatic contours suggesting possible hepatic cirrhosis. Portal vein is patent on color Doppler imaging with normal direction of blood flow towards the liver. Other: None. IMPRESSION: Increased echogenicity of hepatic parenchyma is noted with slightly nodular hepatic contours suggesting of hepatic cirrhosis. No definite focal sonographic hepatic abnormality is noted. Mild to moderate gallbladder wall thickening is noted which most likely is due to adjacent hepatocellular disease. No definite cholelithiasis is noted. Electronically Signed   By: Lupita Raider M.D.   On: 04/03/2020 12:49    ASSESSMENT & PLAN Mathew [name] 40 y.o. [name] with medical history significant for ETOH abuse/cirrhosis who presents for evaluation of thrombocytopenia.   After review the labs, view the records, discussed with the patient the findings most consistent with thrombocytopenia and anemia in the setting of alcohol abuse/cirrhosis.  The patient has a known history of cirrhotic disease and unfortunately does not follow regularly with a gastroenterologist.  We have offered to connect him back to Dr. Matthias Hughs who apparently  saw him back in 2020 based on prior records.  In order to assure that there are no other causes for the patient's findings we will order iron studies, vitamin B12, folate, and review the peripheral blood film.  Overall his findings are most consistent with chronic illness from his liver disease.  There is no need for routine follow-up in our clinic.  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 --In the event the patient were to have bleeding issues or were to require a surgical intervention of some kind we could consider administering a TPO mimetic to the patient in order to bolster his platelet counts.  This would be on an as-needed basis. --Prior viral serologies of hepatitis B, hepatitis C, and HIV were all negative in the last 4 years. --Encouraged continued follow-up with his hepatologist. 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 --We will evaluate nutritional studies today in order to assure that there is no intervention required for his anemia. --Repeat CBC in order to assure that his hemoglobin is trending upward. --If there is a clear etiology noted during his evaluation we can have the patient return for further work-up and evaluation.  Orders Placed This Encounter  Procedures  . CBC with Differential (Cancer Center Only)    Standing Status:   Future    Number of Occurrences:   1    Standing Expiration Date:   04/15/2021  . Platelet by Citrate    Standing Status:   Future    Number of Occurrences:   1    Standing Expiration Date:   04/15/2021  . CMP (Cancer Center only)    Standing Status:   Future    Number of Occurrences:   1    Standing Expiration Date:   04/15/2021  . Ferritin    Standing Status:    Future    Number of Occurrences:   1    Standing Expiration  Date:   04/15/2021  . Iron and TIBC    Standing Status:   Future    Number of Occurrences:   1    Standing Expiration Date:   04/15/2021  . Save Smear (SSMR)    Standing Status:   Future    Number of Occurrences:   1    Standing Expiration Date:   04/15/2021  . Retic Panel    Standing Status:   Future    Number of Occurrences:   1    Standing Expiration Date:   04/15/2021  . Vitamin B12    Standing Status:   Future    Number of Occurrences:   1    Standing Expiration Date:   04/15/2021  . Folate, Serum    Standing Status:   Future    Number of Occurrences:   1    Standing Expiration Date:   04/15/2021  . Ambulatory referral to Gastroenterology    Referral Priority:   Routine    Referral Type:   Consultation    Referral Reason:   Specialty Services Required    Referred to Provider:   Bernette Redbird, MD    Number of Visits Requested:   1    All questions were answered. The patient knows to call the clinic with any problems, questions or concerns.  A total of more than 60 minutes were spent on this encounter and over half of that time was spent on counseling and coordination of care as outlined above.   Mathew Barns, MD Department of Hematology/Oncology [facility] Phone: [phone] Pager: [phone] Email: [email or web address]  04/15/2020 3:10 PM

## 2020-04-14 NOTE — Telephone Encounter (Signed)
Pt was sent a letter from financial dept. Inform them, that the application they submitted was incomplete, since they were missing some documentation at the time of the appointment, Pt need to reschedule and resubmit all new papers and application for [employer] and [employer], P.S. old documents has been sent back by mail to the Pt and Pt. need to make a new appt. 

## 2020-04-15 ENCOUNTER — Inpatient Hospital Stay: Payer: Self-pay | Attending: Hematology and Oncology | Admitting: Hematology and Oncology

## 2020-04-15 ENCOUNTER — Other Ambulatory Visit: Payer: Self-pay

## 2020-04-15 ENCOUNTER — Inpatient Hospital Stay: Payer: Self-pay

## 2020-04-15 ENCOUNTER — Encounter: Payer: Self-pay | Admitting: Hematology and Oncology

## 2020-04-15 VITALS — BP 103/59 | HR 74 | Temp 98.7°F | Resp 15 | Ht 65.0 in | Wt 159.2 lb

## 2020-04-15 DIAGNOSIS — R42 Dizziness and giddiness: Secondary | ICD-10-CM | POA: Insufficient documentation

## 2020-04-15 DIAGNOSIS — F1010 Alcohol abuse, uncomplicated: Secondary | ICD-10-CM | POA: Insufficient documentation

## 2020-04-15 DIAGNOSIS — D696 Thrombocytopenia, unspecified: Secondary | ICD-10-CM

## 2020-04-15 DIAGNOSIS — R5383 Other fatigue: Secondary | ICD-10-CM | POA: Insufficient documentation

## 2020-04-15 DIAGNOSIS — K7030 Alcoholic cirrhosis of liver without ascites: Secondary | ICD-10-CM

## 2020-04-15 DIAGNOSIS — R531 Weakness: Secondary | ICD-10-CM | POA: Insufficient documentation

## 2020-04-15 DIAGNOSIS — F17210 Nicotine dependence, cigarettes, uncomplicated: Secondary | ICD-10-CM | POA: Insufficient documentation

## 2020-04-15 DIAGNOSIS — R0602 Shortness of breath: Secondary | ICD-10-CM | POA: Insufficient documentation

## 2020-04-15 DIAGNOSIS — R7989 Other specified abnormal findings of blood chemistry: Secondary | ICD-10-CM | POA: Insufficient documentation

## 2020-04-15 DIAGNOSIS — Z79899 Other long term (current) drug therapy: Secondary | ICD-10-CM | POA: Insufficient documentation

## 2020-04-15 DIAGNOSIS — D649 Anemia, unspecified: Secondary | ICD-10-CM | POA: Insufficient documentation

## 2020-04-15 DIAGNOSIS — K7460 Unspecified cirrhosis of liver: Secondary | ICD-10-CM | POA: Insufficient documentation

## 2020-04-15 LAB — CBC WITH DIFFERENTIAL (CANCER CENTER ONLY)
Abs Immature Granulocytes: 0.01 10*3/uL (ref 0.00–0.07)
Basophils Absolute: 0 10*3/uL (ref 0.0–0.1)
Basophils Relative: 1 %
Eosinophils Absolute: 0.1 10*3/uL (ref 0.0–0.5)
Eosinophils Relative: 2 %
HCT: 29.5 % — ABNORMAL LOW (ref 39.0–52.0)
Hemoglobin: 9.1 g/dL — ABNORMAL LOW (ref 13.0–17.0)
Immature Granulocytes: 0 %
Lymphocytes Relative: 31 %
Lymphs Abs: 0.9 10*3/uL (ref 0.7–4.0)
MCH: 27.7 pg (ref 26.0–34.0)
MCHC: 30.8 g/dL (ref 30.0–36.0)
MCV: 89.9 fL (ref 80.0–100.0)
Monocytes Absolute: 0.4 10*3/uL (ref 0.1–1.0)
Monocytes Relative: 14 %
Neutro Abs: 1.6 10*3/uL — ABNORMAL LOW (ref 1.7–7.7)
Neutrophils Relative %: 52 %
Platelet Count: 85 10*3/uL — ABNORMAL LOW (ref 150–400)
RBC: 3.28 MIL/uL — ABNORMAL LOW (ref 4.22–5.81)
RDW: 20.2 % — ABNORMAL HIGH (ref 11.5–15.5)
WBC Count: 3 10*3/uL — ABNORMAL LOW (ref 4.0–10.5)
nRBC: 0 % (ref 0.0–0.2)

## 2020-04-15 LAB — CMP (CANCER CENTER ONLY)
ALT: 29 U/L (ref 0–44)
AST: 47 U/L — ABNORMAL HIGH (ref 15–41)
Albumin: 2.2 g/dL — ABNORMAL LOW (ref 3.5–5.0)
Alkaline Phosphatase: 180 U/L — ABNORMAL HIGH (ref 38–126)
Anion gap: 7 (ref 5–15)
BUN: 6 mg/dL (ref 6–20)
CO2: 22 mmol/L (ref 22–32)
Calcium: 7.5 mg/dL — ABNORMAL LOW (ref 8.9–10.3)
Chloride: 103 mmol/L (ref 98–111)
Creatinine: 0.77 mg/dL (ref 0.61–1.24)
GFR, Estimated: >60 mL/min (ref >60)
Glucose, Bld: 129 mg/dL — ABNORMAL HIGH (ref 70–99)
Potassium: 4.2 mmol/L (ref 3.5–5.1)
Sodium: 132 mmol/L — ABNORMAL LOW (ref 135–145)
Total Bilirubin: 3.1 mg/dL — ABNORMAL HIGH (ref 0.3–1.2)
Total Protein: 7.5 g/dL (ref 6.5–8.1)

## 2020-04-15 LAB — RETIC PANEL
Immature Retic Fract: 10.5 % (ref 2.3–15.9)
RBC.: 3.28 MIL/uL — ABNORMAL LOW (ref 4.22–5.81)
Retic Count, Absolute: 47.9 10*3/uL (ref 19.0–186.0)
Retic Ct Pct: 1.5 % (ref 0.4–3.1)
Reticulocyte Hemoglobin: 32.1 pg (ref >27.9)

## 2020-04-15 LAB — SAVE SMEAR(SSMR), FOR PROVIDER SLIDE REVIEW

## 2020-04-15 LAB — IRON AND TIBC
Iron: 51 ug/dL (ref 42–163)
Saturation Ratios: 25 % (ref 20–55)
TIBC: 200 ug/dL — ABNORMAL LOW (ref 202–409)
UIBC: 149 ug/dL (ref 117–376)

## 2020-04-15 LAB — PLATELET BY CITRATE

## 2020-04-15 LAB — FOLATE: Folate: 28.5 ng/mL (ref >5.9)

## 2020-04-15 LAB — VITAMIN B12: Vitamin B-12: 2230 pg/mL — ABNORMAL HIGH (ref 180–914)

## 2020-04-15 LAB — FERRITIN: Ferritin: 37 ng/mL (ref 24–336)

## 2020-04-23 ENCOUNTER — Telehealth: Payer: Self-pay | Admitting: *Deleted

## 2020-04-23 NOTE — Telephone Encounter (Signed)
Referral for [facility]/Dr. buccini fax'd

## 2020-05-12 ENCOUNTER — Ambulatory Visit: Payer: Self-pay | Attending: Critical Care Medicine | Admitting: Critical Care Medicine

## 2020-05-12 ENCOUNTER — Other Ambulatory Visit: Payer: Self-pay

## 2020-05-12 ENCOUNTER — Encounter: Payer: Self-pay | Admitting: Critical Care Medicine

## 2020-05-12 VITALS — BP 118/70 | HR 69 | Resp 17 | Ht 61.0 in | Wt 173.4 lb

## 2020-05-12 DIAGNOSIS — A419 Sepsis, unspecified organism: Secondary | ICD-10-CM

## 2020-05-12 DIAGNOSIS — J189 Pneumonia, unspecified organism: Secondary | ICD-10-CM

## 2020-05-12 DIAGNOSIS — F1010 Alcohol abuse, uncomplicated: Secondary | ICD-10-CM

## 2020-05-12 DIAGNOSIS — Z720 Tobacco use: Secondary | ICD-10-CM

## 2020-05-12 DIAGNOSIS — E559 Vitamin D deficiency, unspecified: Secondary | ICD-10-CM

## 2020-05-12 DIAGNOSIS — K7031 Alcoholic cirrhosis of liver with ascites: Secondary | ICD-10-CM

## 2020-05-12 DIAGNOSIS — D509 Iron deficiency anemia, unspecified: Secondary | ICD-10-CM

## 2020-05-12 DIAGNOSIS — D62 Acute posthemorrhagic anemia: Secondary | ICD-10-CM

## 2020-05-12 DIAGNOSIS — K429 Umbilical hernia without obstruction or gangrene: Secondary | ICD-10-CM

## 2020-05-12 DIAGNOSIS — R6520 Severe sepsis without septic shock: Secondary | ICD-10-CM

## 2020-05-12 DIAGNOSIS — D696 Thrombocytopenia, unspecified: Secondary | ICD-10-CM

## 2020-05-12 DIAGNOSIS — K7030 Alcoholic cirrhosis of liver without ascites: Secondary | ICD-10-CM

## 2020-05-12 DIAGNOSIS — E8809 Other disorders of plasma-protein metabolism, not elsewhere classified: Secondary | ICD-10-CM

## 2020-05-12 MED ORDER — FOLIC ACID 1 MG PO TABS
ORAL_TABLET | Freq: Every day | ORAL | 3 refills | Status: DC
  Filled 2020-05-12 – 2020-05-20 (×2): qty 30, 30d supply, fill #0

## 2020-05-12 MED ORDER — VITAMIN D (ERGOCALCIFEROL) 1.25 MG (50000 UNIT) PO CAPS
ORAL_CAPSULE | ORAL | 2 refills | Status: DC
Start: 2020-05-12 — End: 2020-11-06
  Filled 2020-05-12 – 2020-05-20 (×2): qty 12, 84d supply, fill #0

## 2020-05-12 MED ORDER — SPIRONOLACTONE 100 MG PO TABS
ORAL_TABLET | Freq: Every day | ORAL | 2 refills | Status: DC
  Filled 2020-05-12 – 2020-05-20 (×2): qty 30, 30d supply, fill #0

## 2020-05-12 MED ORDER — THIAMINE HCL 100 MG PO TABS
ORAL_TABLET | Freq: Every day | ORAL | 2 refills | Status: DC
Start: 2020-05-12 — End: 2020-11-06
  Filled 2020-05-12: qty 60, fill #0

## 2020-05-12 MED ORDER — FUROSEMIDE 40 MG PO TABS
ORAL_TABLET | ORAL | 3 refills | Status: DC
  Filled 2020-05-12 – 2020-05-20 (×2): qty 30, 30d supply, fill #0

## 2020-05-12 NOTE — Assessment & Plan Note (Signed)
Pneumonia no organism specified completed all antibiotics currently free of pneumonia

## 2020-05-12 NOTE — Progress Notes (Signed)
Subjective:    Patient ID: Mathew [name] ([place]), male    DOB: 1980/07/18, 40 y.o.   MRN: [id number]  39 y.o.M hx of Alcoholic cirrhosis, Seizure d/o. ETOH use   02/03/20 is a former Dr. Chapman Fitch patient here to establish primary care. Last visit was in December with provider Mayers but prior to that November with Dr. Chapman Fitch. Below is documentation from Dr. Siri Cole visit 11/2019 Mathew [name], 40 year old male, who is seen in follow-up of office visit on 10/14/2019 at which time patient made appointment to establish care following 09/03/2019 ED visit due to lower extremity edema and history of alcoholic liver disease.  He reports no current alcohol use.  No withdrawal issues/symptoms as he feels that the medication, Librium, prescribed at his last visit helped.  He reports that he does feel slightly better than at his previous visit.  He continues to have issues with lower extremity swelling and states that he stopped taking his medications for about 4 days recently because he took over-the-counter pain medications and he was told that he could not take both medications.  He reports that he does not have compression stockings.  He has not had follow-up with gastroenterology since his last visit.  On review of systems, he denies any current shortness of breath, cough and no chest pain.  He denies any abdominal pain.  He denies any unusual bleeding but has noticed increased issues with bruising. . Alcoholic cirrhosis of liver without ascites ([place]) 2. Edema due to hypoalbuminemia and chronic liver disease; 3.  Hypokalemia; 4.  Microcytic anemia; 5.  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  He does report that he missed 4 days of diuretic medications within the past 2 weeks.  He has been asked to take his furosemide twice daily for the next 3 days then resume once daily dosing.  He has also been asked to keep his legs elevated at home.  Discussed with patient  that if he is sitting on the couch, he should instead lie down and place a few pillows beneath his legs/feet as this will help with the swelling.  New prescription provided for compression stockings as well as information on local medical supply stores.  On review of chart, he was given prescription at his emergency department visit in August for compression hose as well.  He will have repeat comprehensive metabolic panel to check electrolytes and liver enzymes.  Referral was placed at his last visit for gastroenterology follow-up and referral is being placed again at today's visit as she states that he has not been contacted regarding this referral.  Additionally, referral has also been made for patient to follow-up with hematology due to thrombocytopenia related to his liver disease as patient also reports that he has noticed increased bruising especially on the upper extremities.  CBC will be repeated at today's visit. - Comprehensive metabolic panel - Ambulatory referral to Gastroenterology - Compression stockings - CBC - Ambulatory referral to Hematology  Patient never saw gastroenterology or hematology as he does not have any insurance access. Patient still trying to apply for financial assistance at this time.  Patient still drinking 1 beer daily. He has not sought any alcohol treatments. He does have a Librium prescription he takes as needed.  He is out of his vitamins thiamine and folic acid at this time.  05/12/2020 This patient is seen today for post hospital follow-up.  This visit was accomplished with Spanish interpreter [phone]  Mathew [name] This patient was hospitalized since the last visit for alcohol abuse acute alcoholic hepatitis with ascites and cirrhosis upper and lower GI bleeding severe acute blood loss anemia and pneumonia with sepsis.  The patient came in with altered mental status as well.  Below is a copy of the discharge summary.  Admit date: 04/02/2020 Discharge date:  04/07/2020  Admitted From: Home Disposition:  Home  Recommendations for Outpatient Follow-up:  1. Follow up with PCP in 2-3 weeks  Discharge Condition:Improved CODE STATUS:Full Diet recommendation: Regular   Brief/Interim Summary: 40 y.o.malewith medical history significant ofalcohol use, alcoholic liver cirrhosis, history of withdrawal, history of anemia, and hypoalbuminemia who presents with weakness, lethargy, confusion.Patient was admitted for concerns of pneumonia with sepsis  Discharge Diagnoses:  Principal Problem:   Severe sepsis ([place]) Active Problems:   ETOH abuse   Thrombocytopenia (HCC)   Alcoholic cirrhosis of liver without ascites (HCC)   Pneumonia  Severe sepsis with Pneumonia present on admit >Patient meets criteria for sepsis with tachycardia in the 100s, tachypnea in the 20s, leukocytosis to 11.3, fever to 102. Severe sepsis considering lactic acid elevated at 5.1. >Likelysource is his right upper lobe pneumonia. -Pt had been continuedon IV fluids -Pt had been continued on empiric azithromycin and rocephin, pt to complete 5 days of tx -lactate hadtrended down  Alcoholic cirrhosis >Patient has known alcoholic cirrhosis from chronic alcohol use. >Alcohol level was negative in the ED >Pt noted to be thrombocytopenic, [name] below >Meld score 24 at time of presentation -Lasix and spironolactone were held in the setting of severe sepsis -Patient was continued on thiamine and folate -RUQ reviewed, findings consistent with known hx of cirrhosis -Ammonia has improved  Toxic metabolic encephalopathy secondary toHepatic encephalopathyand ETOH withdrawal -ammonia peaked to 57, resolved after receiving lactulose -Patient was continued with CIWA per below  Thrombocytopenia -Patient has baseline thrombocytopenia -likely worsened secondary to presenting sepsis -Avoid anticoagulation/antiplatelet unless necessary -Platelets are trending  up  Hx ETOH abuse with acute ETOH withdrawals -Initially believed last ETOH was around 2 weeks prior to admit -Familyreportsnotbeingsure about pt's trueETOH hisotry -ETOH level at presentation is neg -Developed ETOH withdrawals with CIWA peaking to 21, now improved with ativan per CIWA protocol -Head CT was ordered given acute mental status change, reviewed and was neg -Cont CIWA. Will need cessation -Of note, chart reviewed and pt has a long hx of ETOH related issues in the past including ETOH related seizures as well as treatment by behavioral health for ETOH problems  Acute toxic metabolic encephalopathy  Discharge Instructions  Allergies as of 04/07/2020  No Known Allergies  Saw Heme MD 04/15/20  Hematological/Oncological History # Thrombocytopenia in the Setting of EtOH abuse/cirrhosis, chronic 08/02/2014: WBC 5.0, Hgb 12.8, MCV 96.2, Plt 152 10/16/2015: Wbc 4.7, Hgb 12.7, MCV 100, Plt 95 12/19/2019: WBC 2.5, Hgb 10.3, Plt 39 04/03/2020: Ultrasound findings consistent with hepatic cirrhosis 04/07/2020: WBC 4.0, Hgb 7.8, MCV 89.6, Plt 55  Following this admission the patient met with hematology Dr. Lorenso Courier on April 6 below is his documentations HEME OV with Dr Lorenso Courier 04/15/2020: establish care with Dr. Lorenso Courier   CHIEF COMPLAINTS/PURPOSE OF CONSULTATION:  "Thrombocytopenia "  HISTORY OF PRESENTING ILLNESS:  [place] 40 y.o. male with medical history significant for ETOH abuse/cirrhosis who presents for evaluation of thrombocytopenia.   On review of the previous records Mr. Elesa Massed has a longstanding history of thrombocytopenia dating back to at least 10/16/2015.  At that time had a white blood cell count  of 4.7, hemoglobin 12.7, and platelet count 95.  He reached a local minimum on 12/19/2019 at which time he had a platelet count of 39.  He subsequently dropped as low as a platelet count of 24 on 04/03/2020.  He made a slight recovery during his hospitalization up to  a platelet count of 55 on 04/07/2020.  Given this patient's longstanding chronic thrombocytopenia in the setting of alcohol abuse he was referred to hematology for further evaluation management.  On exam today Mrs. [name]'s medication is aided by a hospital improved interpreter via iPad.  He notes that he has not had any issues with bleeding, dark stools, or other overt signs of bleeding.  He does have occasional bruising on his extremities which she notes is a chronic issue.  He notes he has not been having any rare blood in his stool.  He endorses having rare shortness of breath and his energy levels are good.  He denies having ever seen a liver doctor before in the past.  He reports he eats an unrestricted diet.  On further discussion his family history is remarkable for elevated blood pressure and his mother and no other clear bleeding or bruising conditions.  He is a former smoker and quit 3 months ago and quit alcohol approximately 3 months ago.  He notes that he drank "too much".  When asked to elaborate he is unable to provide details on drink of choice or amount of drinking he was doing.  He currently works odd jobs such as Architect, Teacher, music, or as a Chief Strategy Officer.  He notes that occasionally he walks out of the sun and feels dizzy but otherwise has been at his baseline level of health.  He notes that he did have some yellowing in his skin during his last hospitalization but this has been steadily improving.  He currently denies any fevers, chills, sweats, nausea, vomiting or diarrhea.  A full 10 point ROS is listed below.  [place] 40 y.o. male with medical history significant for ETOH abuse/cirrhosis who presents for evaluation of thrombocytopenia.   After review the labs, view the records, discussed with the patient the findings most consistent with thrombocytopenia and anemia in the setting of alcohol abuse/cirrhosis.  The patient has a known history of cirrhotic disease and  unfortunately does not follow regularly with a gastroenterologist.  We have offered to connect him back to Dr. Cristina Gong who apparently saw him back in 2020 based on prior records.  In order to assure that there are no other causes for the patient's findings we will order iron studies, vitamin B12, folate, and review the peripheral blood film.  Overall his findings are most consistent with chronic illness from his liver disease.  There is no need for routine follow-up in our clinic.  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 --In the event the patient were to have bleeding issues or were to require a surgical intervention of some kind we could consider administering a TPO mimetic to the patient in order to bolster his platelet counts.  This would be on an as-needed basis. --Prior viral serologies of hepatitis B, hepatitis C, and HIV were all negative in the last 4 years. --Encouraged continued follow-up with his hepatologist. --Return to clinic on an as-needed basis  # Anemia, normocytic --Likely due to his severe sepsis with pneumonia on presentation and possible contribution of anemia of chronic disease with  his alcoholic cirrhosis. --We will evaluate nutritional studies today in order to assure that there is no intervention required for his anemia. --Repeat CBC in order to assure that his hemoglobin is trending upward. --If there is a clear etiology noted during his evaluation we can have the patient return for further work-up and evaluation.   Today the patient states he is improved.  He states he is no longer drinking any alcohol.  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  He states his primary motivation of drinking was to being with friends and he is now avoiding this activity.  He states he has minimal edema in the feet and minimal  shortness of breath with exertion.  Patient has no other complaints at this time.  Patient is smoking tobacco products.  He is compliant with the Aldactone he is now on.  Patient is yet to [name] gastroenterology.  Patient does not have insurance this is a barrier to gastroenterology and he does have the orange card application here today.  Patient states that shortness of breath and cough are improved he also states his mental activity is improved as well.  Repeat labs seen at the hematology visit showed improvement in liver function and blood counts   Past Medical History:  Diagnosis Date  . Alcohol withdrawal seizure ([place])   . ETOH abuse   . Pneumonia 04/02/2020     Family History  Problem Relation Age of Onset  . Cancer Neg Hx   . Heart disease Neg Hx      Social History   Socioeconomic History  . Marital status: Significant Other    Spouse name: Not on file  . Number of children: Not on file  . Years of education: Not on file  . Highest education level: Not on file  Occupational History  . Not on file  Tobacco Use  . Smoking status: Current Every Day Smoker    Packs/day: 0.25    Types: Cigarettes    Start date: 08/11/2019  . Smokeless tobacco: Never Used  Vaping Use  . Vaping Use: Never used  Substance and Sexual Activity  . Alcohol use: Not Currently    Comment: last beer 2x weeks ago  . Drug use: No  . Sexual activity: Not Currently  Other Topics Concern  . Not on file  Social History Narrative  . Not on file   Social Determinants of Health   Financial Resource Strain: Not on file  Food Insecurity: Not on file  Transportation Needs: Not on file  Physical Activity: Not on file  Stress: Not on file  Social Connections: Not on file  Intimate Partner Violence: Not on file     No Known Allergies   Outpatient Medications Prior to Visit  Medication Sig Dispense Refill  . folic acid (FOLVITE) 1 MG tablet TAKE 1 TABLET (1 MG TOTAL) BY MOUTH DAILY. 60 tablet 3   . furosemide (LASIX) 40 MG tablet TAKE 1 TABLET (40 MG TOTAL) BY MOUTH DAILY AS NEEDED FOR EDEMA. 40 tablet 3  . spironolactone (ALDACTONE) 100 MG tablet TAKE 1 TABLET (100 MG TOTAL) BY MOUTH DAILY. 60 tablet 2  . thiamine 100 MG tablet TAKE 1 TABLET (100 MG TOTAL) BY MOUTH DAILY. 60 tablet 2  . Vitamin D, Ergocalciferol, (DRISDOL) 1.25 MG (50000 UNIT) CAPS capsule TAKE 1 CAPSULE (50,000 UNITS TOTAL) BY MOUTH EVERY 7 (SEVEN) DAYS. (Patient taking differently: Take 50,000 Units by mouth every  7 (seven) days. Thurs/ Fri) 12 capsule 2   No facility-administered medications prior to visit.       Review of Systems  HENT: Negative.   Respiratory: Negative.   Cardiovascular: Positive for leg swelling. Negative for chest pain.  Gastrointestinal: Positive for abdominal distention.  Musculoskeletal: Negative.   Skin: Negative.   Neurological: Negative.        Objective:   Physical Exam Vitals:   05/12/20 1329  BP: 118/70  Pulse: 69  Resp: 17  SpO2: 95%  Weight: 173 lb 6.4 oz (78.7 kg)  Height: $Remove'5\' 1"'[id number]$  (1.549 m)    Gen: Pleasant, well-nourished, in no distress,  normal affect  ENT: No lesions,  mouth clear,  oropharynx clear, no postnasal drip  Neck: No JVD, no TMG, no carotid bruits  Lungs: No use of accessory muscles, no dullness to percussion, clear without rales or rhonchi  Cardiovascular: RRR, heart sounds normal, no murmur or gallops, 1+peripheral edema  Abdomen: soft and NT, no HSM,  BS normal, no ascites liver is not enlarged  Musculoskeletal: No deformities, no cyanosis or clubbing  Neuro: alert, non focal  Skin: Warm, no lesions or rashes  BMP Latest Ref Rng & Units 04/15/2020 04/07/2020 04/06/2020  Glucose 70 - 99 mg/dL 129(H) 121(H) 80  BUN 6 - 20 mg/dL $Remove'6 9 14  'KALBOPf$ Creatinine 0.61 - 1.24 mg/dL 0.77 0.53(L) 0.61  BUN/Creat Ratio 9 - 20 - - -  Sodium 135 - 145 mmol/L 132(L) 133(L) 137  Potassium 3.5 - 5.1 mmol/L 4.2 3.9 3.3(L)  Chloride 98 - 111 mmol/L 103 112(H)  116(H)  CO2 22 - 32 mmol/L 22 17(L) 17(L)  Calcium 8.9 - 10.3 mg/dL 7.5(L) 7.2(L) 7.4(L)   Hepatic Function Latest Ref Rng & Units 04/15/2020 04/07/2020 04/06/2020  Total Protein 6.5 - 8.1 g/dL 7.5 5.4(L) 5.3(L)  Albumin 3.5 - 5.0 g/dL 2.2(L) 1.7(L) 1.7(L)  AST 15 - 41 U/L 47(H) 63(H) 67(H)  ALT 0 - 44 U/L $Remo'29 26 28  'rNKbi$ Alk Phosphatase 38 - 126 U/L 180(H) 86 59  Total Bilirubin 0.3 - 1.2 mg/dL 3.1(H) 2.1(H) 3.2(H)  Bilirubin, Direct 0.1 - 0.5 mg/dL - - -   CBC Latest Ref Rng & Units 04/15/2020 04/07/2020 04/06/2020  WBC 4.0 - 10.5 K/uL 3.0(L) 4.0 3.3(L)  Hemoglobin 13.0 - 17.0 g/dL 9.1(L) 7.8(L) 7.8(L)  Hematocrit 39.0 - 52.0 % 29.5(L) 24.9(L) 25.5(L)  Platelets 150 - 400 K/uL 85(L) 55(L) 43(L)       Assessment & Plan:  I personally reviewed all images and lab data in the [facility] system as well as any outside material available during this office visit and agree with the  radiology impressions.   Pneumonia Pneumonia no organism specified completed all antibiotics currently free of pneumonia  Alcoholic cirrhosis of liver without ascites ([place]) Alcoholic cirrhosis of the liver without ascites acute alcoholic hepatitis this is improved  Patient currently is avoiding alcohol encouraged to maintain this  Continue Aldactone daily  Will continue thiamine and folic acid  Will continue vitamin D weekly  Will obtain metabolic profile blood counts at this visit  Vitamin D deficiency Patient was confused that the vitamin D was a weekly dose we rectified this and refill this medication weekly  Thrombocytopenia ([facility]) Thrombocytopenia is a chronic condition related to alcoholic liver disease and has improved work-up with hematology did not reveal any other vitamin deficiencies  Platelet count has improved and we will recheck again today no active bleeding seen  Severe sepsis ([place]) Sepsis  has resolved  Microcytic anemia Iron studies were normal folic acid studies normal  ETOH abuse History of  ethanol abuse currently he states he is sober will continue to follow him closely to ensure this persists  Edema due to hypoalbuminemia Lower extremity edema secondary to hypoalbuminemia note albumin levels are improving  Acute blood loss anemia Hemoglobin up to 9.1 from lower levels no longer actively bleeding  Tobacco use    . Current smoking consumption amount: 1/2 pack a day  . Dicsussion on advise to quit smoking and smoking impacts:  Impacts on lung health . Patient's willingness to quit: Not ready to quit  . Methods to quit smoking discussed: Behavioral modification  . Medication management of smoking session drugs discussed: Not indicated     Setting quit date not established . Follow-up arranged 2 months   Time spent counseling the patient: 5 minutes     Diagnoses and all orders for this visit:  Thrombocytopenia ([place]) -     CBC with Differential/Platelet  Vitamin D deficiency -     Vitamin D, Ergocalciferol, (DRISDOL) 1.25 MG (50000 UNIT) CAPS capsule; TAKE 1 CAPSULE (50,000 UNITS TOTAL) BY MOUTH EVERY 7 (SEVEN) DAYS.  Alcoholic cirrhosis of liver without ascites ([facility]) -     Comprehensive metabolic panel -     folic acid (FOLVITE) 1 MG tablet; TAKE 1 TABLET (1 MG TOTAL) BY MOUTH DAILY.  Alcoholic cirrhosis of liver with ascites (HCC) -     furosemide (LASIX) 40 MG tablet; TAKE 1 TABLET (40 MG TOTAL) BY MOUTH DAILY AS NEEDED FOR EDEMA. -     thiamine 100 MG tablet; TAKE 1 TABLET (100 MG TOTAL) BY MOUTH DAILY. -     spironolactone (ALDACTONE) 100 MG tablet; TAKE 1 TABLET (100 MG TOTAL) BY MOUTH DAILY.  Umbilical hernia without obstruction and without gangrene -     furosemide (LASIX) 40 MG tablet; TAKE 1 TABLET (40 MG TOTAL) BY MOUTH DAILY AS NEEDED FOR EDEMA.  Pneumonia of right upper lobe due to infectious organism  Severe sepsis (HCC)  Microcytic anemia  ETOH abuse  Edema due to hypoalbuminemia  Acute blood loss anemia  Tobacco use  Patient  decided against receiving COVID vaccination patient advised to seek care and be tested early if he receives COVID infection as there now oral antiviral treatments  I spent 41 minutes reviewing patient's records including outside records previous documentations formulating a complex medical decision making plan patient and discussing with him the plan using a Spanish interpreter to achieve language.

## 2020-05-12 NOTE — Assessment & Plan Note (Signed)
Lower extremity edema secondary to hypoalbuminemia note albumin levels are improving

## 2020-05-12 NOTE — Assessment & Plan Note (Signed)
Sepsis has resolved ?

## 2020-05-12 NOTE — Assessment & Plan Note (Signed)
Thrombocytopenia is a chronic condition related to alcoholic liver disease and has improved work-up with hematology did not reveal any other vitamin deficiencies  Platelet count has improved and we will recheck again today no active bleeding seen

## 2020-05-12 NOTE — Assessment & Plan Note (Signed)
Hemoglobin up to 9.1 from lower levels no longer actively bleeding

## 2020-05-12 NOTE — Patient Instructions (Signed)
No change in medications  Labs will be obtained today  You have declined the COVID-vaccine please let us know if you ever develop COVID so we can treat you with the new oral treatments  Continue to abstain from alcohol as you are doing  Please reduce your tobacco content intake  Follow a healthy diet as outlined below  Return to see [name] 2 months  Ha rechazado la vacuna COVID, infrmenos si alguna vez desarrolla COVID para que podamos tratarlo con los nuevos tratamientos orales.  Contine abstenindose del alcohol como lo est haciendo.  Por favor, reduzca su consumo de contenido de tabaco  Siga una dieta saludable como se describe a continuacin.  Volver a ver al [name] 2 meses   [email or web address]">  Plan de alimentacin DASH DASH Eating Plan DASH es la sigla en ingls de "Enfoques Alimentarios para Detener la Hipertensin". El plan de alimentacin DASH ha demostrado:  Bajar la presin arterial elevada (hipertensin).  Reducir el riesgo de diabetes tipo 2, enfermedad cardaca y accidente cerebrovascular.  Ayudar a perder peso. Consejos para seguir Consulting civil engineer las etiquetas de los alimentos  Verifique la cantidad de sal (sodio) por porcin en las etiquetas de los alimentos. Elija alimentos con menos del 5 por ciento del valor diario de sodio. Generalmente, los alimentos con menos de 300 miligramos (mg) de sodio por porcin se encuadran dentro de este plan alimentario.  Para encontrar cereales integrales, busque la palabra "integral" como primera palabra en la lista de ingredientes. Al ir de compras  Compre productos en los que en su etiqueta diga: "bajo contenido de sodio" o "sin agregado de sal".  Compre alimentos frescos. Evite los alimentos enlatados y comidas precocidas o congeladas. Al cocinar  Evite agregar sal cuando cocine. Use hierbas o aderezos sin sal, en lugar de sal de mesa o sal marina. Consulte  al mdico o farmacutico antes de usar sustitutos de la sal.  No fra los alimentos. A la hora de cocinar los alimentos opte por hornearlos, hervirlos, grillarlos, asarlos al horno y asarlos a Patent attorney.  Cocine con aceites cardiosaludables, como oliva, canola, aguacate, soja o girasol. Planificacin de las comidas  Consuma una dieta equilibrada, que incluya lo siguiente: ? 4o ms porciones de frutas y 4 o ms porciones de Warehouse manager. Trate de que medio plato de cada comida sea de frutas y verduras. ? De 6 a 8porciones de cereales [employer]. ? Menos de 6 onzas (170g) de carne, aves o pescado Copy. Una porcin de 3 onzas (85g) de carne tiene casi el mismo tamao que un mazo de cartas. Un huevo equivale a 1 onza (28g). ? De 2 a 3 porciones de productos lcteos descremados por da. Una porcin es 1taza ( ). ? 1 porcin de frutos secos, semillas o frijoles 5 veces por semana. ? De 2 a 3 porciones de grasas cardiosaludables. Las grasas saludables llamadas cidos grasos omega-3 se encuentran en alimentos como las nueces, las semillas de [place], las leches fortificadas y [place]. Estas grasas tambin se encuentran en los pescados de agua fra, como la sardina, el salmn y la caballa.  Limite la cantidad que consume de: ? Alimentos enlatados o envasados. ? Alimentos con alto contenido de grasa trans, como algunos alimentos fritos. ? Alimentos con alto contenido de grasa saturada, como carne con grasa. ? Postres y otros dulces, bebidas azucaradas y otros alimentos con azcar agregada. ? Productos lcteos enteros.  No le agregue sal a  los alimentos antes de probarlos.  No coma ms de 4 yemas de huevo por semana.  Trate de comer al menos 2 comidas vegetarianas por semana.  Consuma ms comida casera y menos de restaurante, de bares y comida rpida.   Estilo de vida  Cuando coma en un restaurante, pida que preparen su comida con menos sal o, en lo  posible, sin nada de sal.  Si bebe alcohol: ? Limite la cantidad que bebe:  De 0 a 1 medida por da para las mujeres que no estn embarazadas.  De 0 a 2 medidas por da para los hombres. ? Est atento a la cantidad de alcohol que hay en las bebidas que toma. En los [place], una medida equivale a una botella de cerveza de 12oz ( ), un vaso de vino de 5oz ( ) o un vaso de una bebida alcohlica de alta graduacin de 1oz (88ml). Informacin general  Evite ingerir ms de 2300 mg de sal por da. Si tiene hipertensin, es posible que necesite reducir la ingesta de sodio a 1,500 mg por da.  Trabaje con su mdico para mantener un peso saludable o perder [employer]. Pregntele cul es el peso recomendado para usted.  Realice al menos 30 minutos de ejercicio que haga que se acelere su corazn (ejercicio Magazine features editor) la [employer] de la [place]. Estas actividades pueden incluir caminar, nadar o andar en bicicleta.  Trabaje con su mdico o nutricionista para ajustar su plan alimentario a sus necesidades calricas personales. Qu alimentos debo comer? Frutas Todas las frutas frescas, congeladas o disecadas. Frutas enlatadas en jugo natural (sin agregado de azcar). Verduras Verduras frescas o congeladas (crudas, al vapor, asadas o grilladas). Jugos de tomate y verduras con bajo contenido de sodio o reducidos en sodio. Salsa y pasta de tomate con bajo contenido de sodio o reducidas en sodio. Verduras enlatadas con bajo contenido de sodio o reducidas en sodio. Granos Pan de salvado o integral. Pasta de salvado o integral. Arroz integral. Avena. Quinua. Trigo burgol. Cereales integrales y con bajo contenido de [place]. Pan pita. Galletitas de [place] con bajo contenido de [place] y [place]. Tortillas de [place] integral. Carnes y otras protenas Pollo o pavo sin piel. Carne de pollo o de [place]. Cerdo desgrasado. Pescado y [employer]. Claras de huevo. Porotos, guisantes o lentejas secos.  Frutos secos, mantequilla de frutos secos y semillas sin sal. Frijoles enlatados sin sal. Cortes de carne vacuna magra, desgrasada. Carne precocida o curada magra y baja en sodio, como embutidos o panes de carne. Lcteos Leche descremada (1%) o descremada. Quesos reducidos en grasa, con bajo contenido de grasa o descremados. Queso blanco o ricota sin grasa, con bajo contenido de [place]. Yogur semidescremado o descremado. Queso con bajo contenido de [place] y [place]. Grasas y [employer] untables que no contengan grasas trans. Aceite vegetal. Jerolyn Shin y aderezos para ensaladas livianos, reducidos en grasa o con bajo contenido de grasas (reducidos en sodio). Aceite de canola, crtamo, oliva, aguacate, soja y [place]. [place]. Alios y condimentos Hierbas. Especias. Mezclas de condimentos sin sal. Otros alimentos Palomitas de maz y pretzels sin sal. Dulces con bajo contenido de grasas. Es posible que los productos que se enumeran ms [place] no constituyan una lista completa de los alimentos y las bebidas que puede tomar. Consulte a un nutricionista para obtener ms informacin. Qu alimentos debo evitar? Nils Pyle Fruta enlatada en almbar liviano o espeso. Frutas cocidas en aceite. Frutas con salsa de crema o mantequilla. Verduras Verduras con crema o  fritas. Verduras en salsa de [place]. Verduras enlatadas regulares (que no sean con bajo contenido de sodio o reducidas en sodio). Pasta y salsa de tomates enlatadas regulares (que no sean con bajo contenido de sodio o reducidas en sodio). Jugos de tomate y verduras regulares (que no sean con bajo contenido de sodio o reducidos en sodio). Pepinillos. Aceitunas. Granos Productos de panificacin hechos con grasa, como medialunas, magdalenas y algunos panes. Comidas con arroz o pasta seca listas para usar. Carnes y [address] de carne con alto contenido de Holiday representative. Costillas. Carne frita. [place]. [place], salame y otras carnes precocidas o  curadas, como embutidos o panes de carne. Grasa de la espalda del cerdo (panceta). Salchicha de cerdo. Frutos secos y semillas con sal. Frijoles enlatados con agregado de sal. Pescado enlatado o ahumado. Huevos enteros o yemas. Pollo o pavo con piel. Lcteos Leche entera o al 2%, crema y [address] y mitad crema. Queso crema entero o con toda su grasa. Yogur entero o endulzado. Quesos con toda su grasa. Sustitutos de cremas no lcteas. Coberturas batidas. Quesos para untar y quesos procesados. Grasas y [employer]. Margarina en barra. Manteca de cerdo. Lardo. Mantequilla clarificada. Grasa de panceta. Aceites tropicales como aceite de coco, palmiste o palma. Alios y condimentos Sal de cebolla, sal de ajo, sal condimentada, sal de mesa y sal marina. Salsa [place]. Salsa trtara. Salsa barbacoa. Salsa teriyaki. Salsa de soja, incluso la que tiene contenido reducido de [place]. Salsa de carne. Salsas en lata y envasadas. Salsa de pescado. Salsa de [place]. Salsa rosada. Rbanos picantes comprados en tiendas. [place]. [place]. Saborizantes y tiernizantes para carne. Caldo en cubitos. Salsas picantes. Adobos preelaborados o envasados. Aderezos para tacos preelaborados o envasados. Salsas de pepinillos. Aderezos comunes para ensalada. Otros alimentos Palomitas de maz y pretzels con sal. Es posible que los productos que se enumeran ms [place] no constituyan una lista completa de los alimentos y las bebidas que Personnel officer. Consulte a un nutricionista para obtener ms informacin. Dnde buscar ms informacin  National Heart, Lung, and Blood Institute (Instituto Nacional del [place], los Pulmones y Risk manager): PopSteam.is  American Heart Association (Asociacin Estadounidense del [place]n): www.heart.org  Academy of Nutrition and Dietetics (Academia de Nutricin y Pension scheme manager): www.eatright.org  National Kidney Foundation (Fundacin Nacional del Rin): www.kidney.org Resumen  El plan  de alimentacin DASH ha demostrado bajar la presin arterial elevada (hipertensin). Tambin puede reducir [employer] de diabetes tipo 2, enfermedad cardaca y accidente cerebrovascular.  Cuando siga el plan de alimentacin DASH, trate de comer ms frutas frescas y verduras, cereales integrales, carnes magras, lcteos descremados y grasas cardiosaludables.  Con el plan de alimentacin DASH, deber limitar el consumo de sal (sodio) a 2,300 mg por da. Si tiene hipertensin, es posible que necesite reducir la ingesta de sodio a 1,500 mg por da.  Trabaje con su mdico o nutricionista para ajustar su plan alimentario a sus necesidades calricas personales. Esta informacin no tiene Theme park manager el consejo del mdico. Asegrese de hacerle al mdico cualquier pregunta que tenga. Document Revised: 01/31/2019 Document Reviewed: 01/31/2019 Elsevier Patient Education  2021 [employer].

## 2020-05-12 NOTE — Assessment & Plan Note (Signed)
Iron studies were normal folic acid studies normal

## 2020-05-12 NOTE — Assessment & Plan Note (Signed)
  .   Current smoking consumption amount: 1/2 pack a day  . Dicsussion on advise to quit smoking and smoking impacts:  Impacts on lung health . Patient's willingness to quit: Not ready to quit  . Methods to quit smoking discussed: Behavioral modification  . Medication management of smoking session drugs discussed: Not indicated     Setting quit date not established . Follow-up arranged 2 months   Time spent counseling the patient: 5 minutes

## 2020-05-12 NOTE — Assessment & Plan Note (Signed)
Patient was confused that the vitamin D was a weekly dose we rectified this and refill this medication weekly

## 2020-05-12 NOTE — Assessment & Plan Note (Signed)
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

## 2020-05-12 NOTE — Assessment & Plan Note (Signed)
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

## 2020-05-13 ENCOUNTER — Telehealth: Payer: Self-pay

## 2020-05-13 ENCOUNTER — Other Ambulatory Visit: Payer: Self-pay

## 2020-05-13 LAB — COMPREHENSIVE METABOLIC PANEL
ALT: 14 IU/L (ref 0–44)
AST: 42 IU/L — ABNORMAL HIGH (ref 0–40)
Albumin/Globulin Ratio: 0.6 — ABNORMAL LOW (ref 1.2–2.2)
Albumin: 2.3 g/dL — ABNORMAL LOW (ref 4.0–5.0)
Alkaline Phosphatase: 173 IU/L — ABNORMAL HIGH (ref 44–121)
BUN/Creatinine Ratio: 7 — ABNORMAL LOW (ref 9–20)
BUN: 4 mg/dL — ABNORMAL LOW (ref 6–24)
Bilirubin Total: 2.9 mg/dL — ABNORMAL HIGH (ref 0.0–1.2)
CO2: 20 mmol/L (ref 20–29)
Calcium: 8 mg/dL — ABNORMAL LOW (ref 8.7–10.2)
Chloride: 106 mmol/L (ref 96–106)
Creatinine, Ser: 0.57 mg/dL — ABNORMAL LOW (ref 0.76–1.27)
Globulin, Total: 4 g/dL (ref 1.5–4.5)
Glucose: 117 mg/dL — ABNORMAL HIGH (ref 65–99)
Potassium: 3.7 mmol/L (ref 3.5–5.2)
Sodium: 136 mmol/L (ref 134–144)
Total Protein: 6.3 g/dL (ref 6.0–8.5)
eGFR: 126 mL/min/{1.73_m2} (ref >59)

## 2020-05-13 LAB — CBC WITH DIFFERENTIAL/PLATELET
Basophils Absolute: 0 10*3/uL (ref 0.0–0.2)
Basos: 0 %
EOS (ABSOLUTE): 0.1 10*3/uL (ref 0.0–0.4)
Eos: 4 %
Hematocrit: 25 % — ABNORMAL LOW (ref 37.5–51.0)
Hemoglobin: 8.5 g/dL — ABNORMAL LOW (ref 13.0–17.7)
Immature Grans (Abs): 0 10*3/uL (ref 0.0–0.1)
Immature Granulocytes: 0 %
Lymphocytes Absolute: 0.9 10*3/uL (ref 0.7–3.1)
Lymphs: 39 %
MCH: 28.4 pg (ref 26.6–33.0)
MCHC: 34 g/dL (ref 31.5–35.7)
MCV: 84 fL (ref 79–97)
Monocytes Absolute: 0.3 10*3/uL (ref 0.1–0.9)
Monocytes: 14 %
Neutrophils Absolute: 1 10*3/uL — ABNORMAL LOW (ref 1.4–7.0)
Neutrophils: 43 %
Platelets: 48 10*3/uL — CL (ref 150–450)
RBC: 2.99 x10E6/uL — ABNORMAL LOW (ref 4.14–5.80)
RDW: 18.1 % — ABNORMAL HIGH (ref 11.6–15.4)
WBC: 2.3 10*3/uL — CL (ref 3.4–10.8)

## 2020-05-13 NOTE — Telephone Encounter (Signed)
-----   Message from [name], MD sent at 05/13/2020  8:03 AM EDT ----- Let pt know his blood counts remain unchanged from visit with hematology.   Avoid alcohol and continue to take thiamine and folic acid daily and vit D  Liver function is improving  kidney normal

## 2020-05-13 NOTE — Telephone Encounter (Signed)
Attempted to call pt using interpreter no answer lvm to return call.  

## 2020-05-14 NOTE — Telephone Encounter (Signed)
-----   Message from Patrick E Wright, MD sent at 05/13/2020  8:03 AM EDT ----- Let pt know his blood counts remain unchanged from visit with hematology.   Avoid alcohol and continue to take thiamine and folic acid daily and vit D  Liver function is improving  kidney normal 

## 2020-05-14 NOTE — Telephone Encounter (Signed)
Attempted to call pt no answer lvm to return call.  

## 2020-05-15 NOTE — Telephone Encounter (Signed)
-----   Message from Patrick E Wright, MD sent at 05/13/2020  8:03 AM EDT ----- Let pt know his blood counts remain unchanged from visit with hematology.   Avoid alcohol and continue to take thiamine and folic acid daily and vit D  Liver function is improving  kidney normal 

## 2020-05-15 NOTE — Telephone Encounter (Signed)
Attempted to call pt multiple times no answer results sent via mail to address on file.  

## 2020-05-20 ENCOUNTER — Other Ambulatory Visit: Payer: Self-pay

## 2020-07-01 ENCOUNTER — Ambulatory Visit: Payer: Self-pay | Admitting: Critical Care Medicine

## 2020-07-12 NOTE — Progress Notes (Deleted)
Subjective:    Patient ID: Mathew [name] ([place]), male    DOB: Oct 19, 1980, 40 y.o.   MRN: [id number]  39 y.o.M hx of Alcoholic cirrhosis, Seizure d/o. ETOH use   02/03/20 is a former Dr. Chapman Fitch patient here to establish primary care. Last visit was in December with provider Mayers but prior to that November with Dr. Chapman Fitch. Below is documentation from Dr. Siri Cole visit 11/2019 Mathew [name], 40 year old male, who is seen in follow-up of office visit on 10/14/2019 at which time patient made appointment to establish care following 09/03/2019 ED visit due to lower extremity edema and history of alcoholic liver disease.  He reports no current alcohol use.  No withdrawal issues/symptoms as he feels that the medication, Librium, prescribed at his last visit helped.  He reports that he does feel slightly better than at his previous visit.  He continues to have issues with lower extremity swelling and states that he stopped taking his medications for about 4 days recently because he took over-the-counter pain medications and he was told that he could not take both medications.  He reports that he does not have compression stockings.  He has not had follow-up with gastroenterology since his last visit.  On review of systems, he denies any current shortness of breath, cough and no chest pain.  He denies any abdominal pain.  He denies any unusual bleeding but has noticed increased issues with bruising. . Alcoholic cirrhosis of liver without ascites ([employer]) 2. Edema due to hypoalbuminemia and chronic liver disease; 3.  Hypokalemia; 4.  Microcytic anemia; 5.  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  He does report that he missed 4 days of diuretic medications within the past 2 weeks.  He has been asked to take his furosemide twice daily for the next 3 days then resume once daily dosing.  He has also been asked to keep his legs elevated at home.  Discussed with patient  that if he is sitting on the couch, he should instead lie down and place a few pillows beneath his legs/feet as this will help with the swelling.  New prescription provided for compression stockings as well as information on local medical supply stores.  On review of chart, he was given prescription at his emergency department visit in August for compression hose as well.  He will have repeat comprehensive metabolic panel to check electrolytes and liver enzymes.  Referral was placed at his last visit for gastroenterology follow-up and referral is being placed again at today's visit as she states that he has not been contacted regarding this referral.  Additionally, referral has also been made for patient to follow-up with hematology due to thrombocytopenia related to his liver disease as patient also reports that he has noticed increased bruising especially on the upper extremities.  CBC will be repeated at today's visit. - Comprehensive metabolic panel - Ambulatory referral to Gastroenterology - Compression stockings - CBC - Ambulatory referral to Hematology  Patient never saw gastroenterology or hematology as he does not have any insurance access. Patient still trying to apply for financial assistance at this time.  Patient still drinking 1 beer daily. He has not sought any alcohol treatments. He does have a Librium prescription he takes as needed.  He is out of his vitamins thiamine and folic acid at this time.  05/12/2020 This patient is seen today for post hospital follow-up.  This visit was accomplished with Spanish interpreter [phone]  Mathew [name] This patient was hospitalized since the last visit for alcohol abuse acute alcoholic hepatitis with ascites and cirrhosis upper and lower GI bleeding severe acute blood loss anemia and pneumonia with sepsis.  The patient came in with altered mental status as well.  Below is a copy of the discharge summary.  Admit date: 04/02/2020 Discharge date:  04/07/2020   Admitted From: Home Disposition:  Home   Recommendations for Outpatient Follow-up:  1. Follow up with PCP in 2-3 weeks   Discharge Condition:Improved CODE STATUS:Full Diet recommendation: Regular    Brief/Interim Summary: 40 y.o. male with medical history significant of alcohol use, alcoholic liver cirrhosis, history of withdrawal, history of anemia, and hypoalbuminemia who presents with weakness, lethargy, confusion. Patient was admitted for concerns of pneumonia with sepsis   Discharge Diagnoses:  Principal Problem:   Severe sepsis ([place]) Active Problems:   ETOH abuse   Thrombocytopenia ([facility])   Alcoholic cirrhosis of liver without ascites ([place])   Pneumonia   Severe sepsis with Pneumonia present on admit > Patient meets criteria for sepsis with tachycardia in the 100s, tachypnea in the 20s, leukocytosis to 11.3, fever to 102.  Severe sepsis considering lactic acid elevated at 5.1. > Likely source is his right upper lobe pneumonia. - Pt had been continued on IV fluids - Pt had been continued on empiric azithromycin and rocephin, pt to complete 5 days of tx - lactate had trended down   Alcoholic cirrhosis > Patient has known alcoholic cirrhosis from chronic alcohol use. > Alcohol level was negative in the ED > Pt noted to be thrombocytopenic, [name] below > Meld score 24 at time of presentation - Lasix and spironolactone were held in the setting of severe sepsis - Patient was continued on thiamine and folate - RUQ reviewed, findings consistent with known hx of cirrhosis -Ammonia has improved   Toxic metabolic encephalopathy secondary to Hepatic encephalopathy and ETOH withdrawal -ammonia peaked to 57, resolved after receiving lactulose -Patient was continued with CIWA per below   Thrombocytopenia -Patient has baseline thrombocytopenia -likely worsened secondary to presenting sepsis -Avoid anticoagulation/antiplatelet unless necessary -Platelets are trending  up   Hx ETOH abuse with acute ETOH withdrawals  -Initially believed last ETOH was around 2 weeks prior to admit -Family reports not being sure about pt's true ETOH hisotry -ETOH level at presentation is neg -Developed ETOH withdrawals with CIWA peaking to 21, now improved with ativan per CIWA protocol -Head CT was ordered given acute mental status change, reviewed and was neg -Cont CIWA. Will need cessation -Of note, chart reviewed and pt has a long hx of ETOH related issues in the past including ETOH related seizures as well as treatment by behavioral health for ETOH problems   Acute toxic metabolic encephalopathy   Discharge Instructions   Allergies as of 04/07/2020  No Known Allergies  Saw Heme MD 04/15/20  Hematological/Oncological History # Thrombocytopenia in the Setting of EtOH abuse/cirrhosis, chronic 08/02/2014: WBC 5.0, Hgb 12.8, MCV 96.2, Plt 152 10/16/2015: Wbc 4.7, Hgb 12.7, MCV 100, Plt 95 12/19/2019: WBC 2.5, Hgb 10.3, Plt 39 04/03/2020: Ultrasound findings consistent with hepatic cirrhosis 04/07/2020: WBC 4.0, Hgb 7.8, MCV 89.6, Plt 55  Following this admission the patient met with hematology Dr. Lorenso Courier on April 6 below is his documentations HEME OV with Dr Lorenso Courier 04/15/2020: establish care with Dr. Lorenso Courier    CHIEF COMPLAINTS/PURPOSE OF CONSULTATION:  "Thrombocytopenia "   HISTORY OF PRESENTING ILLNESS:  [place] 40 y.o. male with medical  history significant for ETOH abuse/cirrhosis who presents for evaluation of thrombocytopenia.    On review of the previous records Mr. Elesa Massed has a longstanding history of thrombocytopenia dating back to at least 10/16/2015.  At that time had a white blood cell count of 4.7, hemoglobin 12.7, and platelet count 95.  He reached a local minimum on 12/19/2019 at which time he had a platelet count of 39.  He subsequently dropped as low as a platelet count of 24 on 04/03/2020.  He made a slight recovery during his hospitalization up to  a platelet count of 55 on 04/07/2020.  Given this patient's longstanding chronic thrombocytopenia in the setting of alcohol abuse he was referred to hematology for further evaluation management.   On exam today Mrs. [name]'s medication is aided by a hospital improved interpreter via iPad.  He notes that he has not had any issues with bleeding, dark stools, or other overt signs of bleeding.  He does have occasional bruising on his extremities which she notes is a chronic issue.  He notes he has not been having any rare blood in his stool.  He endorses having rare shortness of breath and his energy levels are good.  He denies having ever seen a liver doctor before in the past.  He reports he eats an unrestricted diet.   On further discussion his family history is remarkable for elevated blood pressure and his mother and no other clear bleeding or bruising conditions.  He is a former smoker and quit 3 months ago and quit alcohol approximately 3 months ago.  He notes that he drank "too much".  When asked to elaborate he is unable to provide details on drink of choice or amount of drinking he was doing.  He currently works odd jobs such as Architect, Teacher, music, or as a Chief Strategy Officer.  He notes that occasionally he walks out of the sun and feels dizzy but otherwise has been at his baseline level of health.  He notes that he did have some yellowing in his skin during his last hospitalization but this has been steadily improving.  He currently denies any fevers, chills, sweats, nausea, vomiting or diarrhea.  A full 10 point ROS is listed below.  [place] 40 y.o. male with medical history significant for ETOH abuse/cirrhosis who presents for evaluation of thrombocytopenia.    After review the labs, view the records, discussed with the patient the findings most consistent with thrombocytopenia and anemia in the setting of alcohol abuse/cirrhosis.  The patient has a known history of cirrhotic disease and  unfortunately does not follow regularly with a gastroenterologist.  We have offered to connect him back to Dr. Cristina Gong who apparently saw him back in 2020 based on prior records.  In order to assure that there are no other causes for the patient's findings we will order iron studies, vitamin B12, folate, and review the peripheral blood film.  Overall his findings are most consistent with chronic illness from his liver disease.  There is no need for routine follow-up in our clinic.   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 --In the event the patient were to have bleeding issues or were to require a surgical intervention of some kind we could consider administering a TPO mimetic to the patient in order to bolster his platelet counts.  This would be on an as-needed basis. --Prior viral serologies of hepatitis B,  hepatitis C, and HIV were all negative in the last 4 years. --Encouraged continued follow-up with his hepatologist. --Return to clinic on an as-needed basis   # Anemia, normocytic --Likely due to his severe sepsis with pneumonia on presentation and possible contribution of anemia of chronic disease with his alcoholic cirrhosis. --We will evaluate nutritional studies today in order to assure that there is no intervention required for his anemia. --Repeat CBC in order to assure that his hemoglobin is trending upward. --If there is a clear etiology noted during his evaluation we can have the patient return for further work-up and evaluation.   Today the patient states he is improved.  He states he is no longer drinking any alcohol.  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  He states his primary motivation of drinking was to being with friends and he is now avoiding this activity.  He states he has minimal edema in the feet and minimal  shortness of breath with exertion.  Patient has no other complaints at this time.  Patient is smoking tobacco products.  He is compliant with the Aldactone he is now on.  Patient is yet to [name] gastroenterology.  Patient does not have insurance this is a barrier to gastroenterology and he does have the orange card application here today.  Patient states that shortness of breath and cough are improved he also states his mental activity is improved as well.  Repeat labs seen at the hematology visit showed improvement in liver function and blood counts   Past Medical History:  Diagnosis Date   Alcohol withdrawal seizure ([place])    ETOH abuse    Pneumonia 04/02/2020     Family History  Problem Relation Age of Onset   Cancer Neg Hx    Heart disease Neg Hx      Social History   Socioeconomic History   Marital status: Significant Other    Spouse name: Not on file   Number of children: Not on file   Years of education: Not on file   Highest education level: Not on file  Occupational History   Not on file  Tobacco Use   Smoking status: Every Day    Packs/day: 0.25    Pack years: 0.00    Types: Cigarettes    Start date: 08/11/2019   Smokeless tobacco: Never  Vaping Use   Vaping Use: Never used  Substance and Sexual Activity   Alcohol use: Not Currently    Comment: last beer 2x weeks ago   Drug use: No   Sexual activity: Not Currently  Other Topics Concern   Not on file  Social History Narrative   Not on file   Social Determinants of Health   Financial Resource Strain: Not on file  Food Insecurity: Not on file  Transportation Needs: Not on file  Physical Activity: Not on file  Stress: Not on file  Social Connections: Not on file  Intimate Partner Violence: Not on file     No Known Allergies   Outpatient Medications Prior to Visit  Medication Sig Dispense Refill   folic acid (FOLVITE) 1 MG tablet TAKE 1 TABLET (1 MG TOTAL) BY MOUTH DAILY. 60 tablet 3   furosemide  (LASIX) 40 MG tablet TAKE 1 TABLET (40 MG TOTAL) BY MOUTH DAILY AS NEEDED FOR EDEMA. 40 tablet 3   spironolactone (ALDACTONE) 100 MG tablet TAKE 1 TABLET (100 MG TOTAL) BY MOUTH DAILY. 60 tablet  2   thiamine 100 MG tablet TAKE 1 TABLET (100 MG TOTAL) BY MOUTH DAILY. 60 tablet 2   Vitamin D, Ergocalciferol, (DRISDOL) 1.25 MG (50000 UNIT) CAPS capsule TAKE 1 CAPSULE (50,000 UNITS TOTAL) BY MOUTH EVERY 7 (SEVEN) DAYS. 12 capsule 2   No facility-administered medications prior to visit.       Review of Systems     Objective:   Physical Exam There were no vitals filed for this visit.   Gen: Pleasant, well-nourished, in no distress,  normal affect  ENT: No lesions,  mouth clear,  oropharynx clear, no postnasal drip  Neck: No JVD, no TMG, no carotid bruits  Lungs: No use of accessory muscles, no dullness to percussion, clear without rales or rhonchi  Cardiovascular: RRR, heart sounds normal, no murmur or gallops, 1+peripheral edema  Abdomen: soft and NT, no HSM,  BS normal, no ascites liver is not enlarged  Musculoskeletal: No deformities, no cyanosis or clubbing  Neuro: alert, non focal  Skin: Warm, no lesions or rashes  BMP Latest Ref Rng & Units 05/12/2020 04/15/2020 04/07/2020  Glucose 65 - 99 mg/dL 117(H) 129(H) 121(H)  BUN 6 - 24 mg/dL 4(L) 6 9  Creatinine 0.76 - 1.27 mg/dL 0.57(L) 0.77 0.53(L)  BUN/Creat Ratio 9 - 20 7(L) - -  Sodium 134 - 144 mmol/L 136 132(L) 133(L)  Potassium 3.5 - 5.2 mmol/L 3.7 4.2 3.9  Chloride 96 - 106 mmol/L 106 103 112(H)  CO2 20 - 29 mmol/L 20 22 17(L)  Calcium 8.7 - 10.2 mg/dL 8.0(L) 7.5(L) 7.2(L)   Hepatic Function Latest Ref Rng & Units 05/12/2020 04/15/2020 04/07/2020  Total Protein 6.0 - 8.5 g/dL 6.3 7.5 5.4(L)  Albumin 4.0 - 5.0 g/dL 2.3(L) 2.2(L) 1.7(L)  AST 0 - 40 IU/L 42(H) 47(H) 63(H)  ALT 0 - 44 IU/L $Remov'14 29 26  'QPLrvu$ Alk Phosphatase 44 - 121 IU/L 173(H) 180(H) 86  Total Bilirubin 0.0 - 1.2 mg/dL 2.9(H) 3.1(H) 2.1(H)  Bilirubin, Direct 0.1 -  0.5 mg/dL - - -   CBC Latest Ref Rng & Units 05/12/2020 04/15/2020 04/07/2020  WBC 3.4 - 10.8 x10E3/uL 2.3(LL) 3.0(L) 4.0  Hemoglobin 13.0 - 17.7 g/dL 8.5(L) 9.1(L) 7.8(L)  Hematocrit 37.5 - 51.0 % 25.0(L) 29.5(L) 24.9(L)  Platelets 150 - 450 x10E3/uL 48(LL) 85(L) 55(L)       Assessment & Plan:  I personally reviewed all images and lab data in the [facility] system as well as any outside material available during this office visit and agree with the  radiology impressions.   No problem-specific Assessment & Plan notes found for this encounter.   There are no diagnoses linked to this encounter. Patient decided against receiving COVID vaccination patient advised to seek care and be tested early if he receives COVID infection as there now oral antiviral treatments  I spent 41 minutes reviewing patient's records including outside records previous documentations formulating a complex medical decision making plan patient and discussing with him the plan using a Spanish interpreter to achieve language.

## 2020-07-14 ENCOUNTER — Ambulatory Visit: Payer: Self-pay | Admitting: Critical Care Medicine

## 2020-07-21 ENCOUNTER — Emergency Department (HOSPITAL_COMMUNITY): Payer: Self-pay

## 2020-07-21 ENCOUNTER — Emergency Department (HOSPITAL_COMMUNITY)
Admission: EM | Admit: 2020-07-21 | Discharge: 2020-07-21 | Disposition: A | Discharge status: Home / Self Care | Payer: Self-pay | Attending: Emergency Medicine | Admitting: Emergency Medicine

## 2020-07-21 ENCOUNTER — Encounter (HOSPITAL_COMMUNITY): Payer: Self-pay | Admitting: *Deleted

## 2020-07-21 DIAGNOSIS — K429 Umbilical hernia without obstruction or gangrene: Secondary | ICD-10-CM | POA: Insufficient documentation

## 2020-07-21 DIAGNOSIS — D61818 Other pancytopenia: Secondary | ICD-10-CM | POA: Insufficient documentation

## 2020-07-21 DIAGNOSIS — F17210 Nicotine dependence, cigarettes, uncomplicated: Secondary | ICD-10-CM | POA: Insufficient documentation

## 2020-07-21 LAB — COMPREHENSIVE METABOLIC PANEL
ALT: 23 U/L (ref 0–44)
AST: 54 U/L — ABNORMAL HIGH (ref 15–41)
Albumin: 2.4 g/dL — ABNORMAL LOW (ref 3.5–5.0)
Alkaline Phosphatase: 191 U/L — ABNORMAL HIGH (ref 38–126)
Anion gap: 5 (ref 5–15)
BUN: <5 mg/dL — ABNORMAL LOW (ref 6–20)
CO2: 25 mmol/L (ref 22–32)
Calcium: 8 mg/dL — ABNORMAL LOW (ref 8.9–10.3)
Chloride: 107 mmol/L (ref 98–111)
Creatinine, Ser: 0.53 mg/dL — ABNORMAL LOW (ref 0.61–1.24)
GFR, Estimated: >60 mL/min (ref >60)
Glucose, Bld: 100 mg/dL — ABNORMAL HIGH (ref 70–99)
Potassium: 3.8 mmol/L (ref 3.5–5.1)
Sodium: 137 mmol/L (ref 135–145)
Total Bilirubin: 2.1 mg/dL — ABNORMAL HIGH (ref 0.3–1.2)
Total Protein: 6.7 g/dL (ref 6.5–8.1)

## 2020-07-21 LAB — CBC WITH DIFFERENTIAL/PLATELET
Abs Immature Granulocytes: 0 10*3/uL (ref 0.00–0.07)
Basophils Absolute: 0 10*3/uL (ref 0.0–0.1)
Basophils Relative: 1 %
Eosinophils Absolute: 0 10*3/uL (ref 0.0–0.5)
Eosinophils Relative: 2 %
HCT: 30 % — ABNORMAL LOW (ref 39.0–52.0)
Hemoglobin: 9.1 g/dL — ABNORMAL LOW (ref 13.0–17.0)
Immature Granulocytes: 0 %
Lymphocytes Relative: 55 %
Lymphs Abs: 1.1 10*3/uL (ref 0.7–4.0)
MCH: 26.9 pg (ref 26.0–34.0)
MCHC: 30.3 g/dL (ref 30.0–36.0)
MCV: 88.8 fL (ref 80.0–100.0)
Monocytes Absolute: 0.3 10*3/uL (ref 0.1–1.0)
Monocytes Relative: 15 %
Neutro Abs: 0.5 10*3/uL — ABNORMAL LOW (ref 1.7–7.7)
Neutrophils Relative %: 27 %
Platelets: 35 10*3/uL — ABNORMAL LOW (ref 150–400)
RBC: 3.38 MIL/uL — ABNORMAL LOW (ref 4.22–5.81)
RDW: 16.7 % — ABNORMAL HIGH (ref 11.5–15.5)
WBC: 1.9 10*3/uL — ABNORMAL LOW (ref 4.0–10.5)
nRBC: 0 % (ref 0.0–0.2)

## 2020-07-21 LAB — LIPASE, BLOOD: Lipase: 46 U/L (ref 11–51)

## 2020-07-21 MED ORDER — MORPHINE SULFATE (PF) 4 MG/ML IV SOLN
4.0000 mg | Freq: Once | INTRAVENOUS | Status: AC
Start: 2020-07-21 — End: 2020-07-21
  Administered 2020-07-21: 4 mg via INTRAVENOUS
  Filled 2020-07-21: qty 1

## 2020-07-21 MED ORDER — ONDANSETRON HCL 4 MG/2ML IJ SOLN
4.0000 mg | Freq: Once | INTRAMUSCULAR | Status: AC
  Administered 2020-07-21: 4 mg via INTRAVENOUS
  Filled 2020-07-21: qty 2

## 2020-07-21 MED ORDER — IOHEXOL 300 MG/ML  SOLN
100.0000 mL | Freq: Once | INTRAMUSCULAR | Status: AC | PRN
  Administered 2020-07-21: 100 mL via INTRAVENOUS

## 2020-07-21 NOTE — ED Provider Notes (Signed)
Emergency Medicine Provider Triage Evaluation Note  Mathew [name] , a 40 y.o. male  was evaluated in triage.  Pt complains of hernia.  Patient reports he has had an umbilical hernia present for the past 3 months after lifting something heavy at work.  It has been causing him pain intermittently over the past month, worsening today in particular when he touches it.  He reports pain is only present surrounding the umbilicus.  No vomiting, no difficulty passing gas or stools.  History obtained using Spanish interpreter  Review of Systems  Positive: Abdominal pain, hernia Negative: Vomiting, constipation, fevers  Physical Exam  BP 118/74   Pulse 84   Temp 98.6 F (37 C) (Oral)   Resp 12   SpO2 99%  Gen:   Awake, no distress   Resp:  Normal effort  MSK:   Moves extremities without difficulty  Other:  Umbilical hernia present without erythema, mildly tender to palpation with some mild tenderness in the periumbilical region but all other quadrants nontender to palpation  Medical Decision Making  Medically screening exam initiated at 9:29 AM.  Appropriate orders placed.  Mathew [name] was informed that the remainder of the evaluation will be completed by another provider, this initial triage assessment does not replace that evaluation, and the importance of remaining in the ED until their evaluation is complete.     Dartha Lodge, PA-C 07/21/20 [id number]    Gerhard Munch, MD 07/26/20 0930

## 2020-07-21 NOTE — Discharge Instructions (Addendum)
Su hernia contiene una pequea cantidad de grasa pero no muestra signos de obstruccin o lesin en los intestinos. Puede usar un cinturn para hernia que se puede comprar en lnea o en una farmacia para ayudar con el apoyo y puede usar Tylenol, hielo y Airline pilot. Evite tocar el rea ya que puede inflamarla an ms. Llame para programar una cita de seguimiento con la clnica de ciruga para una evaluacin adicional de esta hernia.  Sus glbulos blancos, glbulos rojos y plaquetas todava estn bajos, el hematlogo lo ha visto por esto antes, haga un seguimiento para una reevaluacin. Contine tomando suplementos de hierro, cido flico y [place]. Siga tambin con su mdico de atencin primaria.  --------------------------------------------  Your hernia contains a small amount of fat but shows no signs of blockage or injury to the bowels.  You can wear a hernia belt which can be purchased online or at a drugstore to help with support and you can use Tylenol, ice and heat.  Avoid touching the area as it can further inflame it.  Please call to schedule follow-up appointment with surgery clinic for further evaluation of this hernia.  Your white blood cells, red blood cells and platelets are all still low, you have been seen by the hematologist for this before please follow back up for reevaluation.  Continue taking iron supplements, folate and thiamine.  Please follow with your primary care doctor as well.

## 2020-07-21 NOTE — ED Notes (Signed)
Discharge instructions reviewed with patient using interpreter. Patient verbalizes understanding of discharge instructions including follow up care. Patient given opportunity to ask questions, states he has no further questions for ED staff at this time.

## 2020-07-21 NOTE — ED Triage Notes (Addendum)
To ED for eval of abd pain over the past few days. Hx of hernia. Was seeing provider through his orange card but missed an appointment due to transportation. No nausea or vomiting. Normal BMs. "Everything is fine. It's just the pain." Interpreter [phone] used

## 2020-07-21 NOTE — ED Provider Notes (Signed)
[facility] EMERGENCY DEPARTMENT Provider Note   CSN: [id number] Arrival date & time: 07/21/20  1017     History Chief Complaint  Patient presents with   Abdominal Pain    Mathew [name] Mathew [name] is a 40 y.o. male.  Mathew [name] is a 40 y.o. male with history of alcohol abuse, alcoholic cirrhosis, who presents with complaints of hernia.  Patient reports he has had an umbilical hernia present for the past 3 months after lifting something heavy at work.  It has been causing him pain intermittently over the past month, worsening today in particular when he touches it.  He reports pain is only present surrounding the umbilicus.  No vomiting, no difficulty passing gas or stools.  Patient denies any previous abdominal surgeries.  He has not been taking anything to treat this pain and has never followed up with a surgeon regarding this hernia.  No fevers or chills.  No other aggravating or alleviating factors.   History obtained using Spanish interpreter  The history is provided by the patient.      Past Medical History:  Diagnosis Date   Alcohol withdrawal seizure (HCC)    ETOH abuse    Pneumonia 04/02/2020    Patient Active Problem List   Diagnosis Date Noted   Tobacco use 05/12/2020   Vitamin D deficiency 02/03/2020   Edema due to hypoalbuminemia 12/20/2019   Acute blood loss anemia 03/13/2018   Alcoholic cirrhosis of liver without ascites (HCC) 03/13/2018   ETOH abuse 07/09/2016   Microcytic anemia 07/09/2016   Thrombocytopenia (HCC) 07/09/2016    Past Surgical History:  Procedure Laterality Date   COLONOSCOPY WITH PROPOFOL N/A 03/17/2018   Procedure: COLONOSCOPY WITH PROPOFOL;  Surgeon: Bernette Redbird, MD;  Location: [facility] ENDOSCOPY;  Service: Endoscopy;  Laterality: N/A;   ESOPHAGOGASTRODUODENOSCOPY (EGD) WITH PROPOFOL N/A 03/17/2018   Procedure: ESOPHAGOGASTRODUODENOSCOPY (EGD) WITH PROPOFOL;  Surgeon: Bernette Redbird, MD;  Location: [facility] ENDOSCOPY;   Service: Endoscopy;  Laterality: N/A;   IR PARACENTESIS  03/14/2018       Family History  Problem Relation Age of Onset   Cancer Neg Hx    Heart disease Neg Hx     Social History   Tobacco Use   Smoking status: Every Day    Packs/day: 0.25    Pack years: 0.00    Types: Cigarettes    Start date: 08/11/2019   Smokeless tobacco: Never  Vaping Use   Vaping Use: Never used  Substance Use Topics   Alcohol use: Not Currently    Comment: last beer 2x weeks ago   Drug use: No    Home Medications Prior to Admission medications   Medication Sig Start Date End Date Taking? Authorizing Provider  folic acid (FOLVITE) 1 MG tablet TAKE 1 TABLET (1 MG TOTAL) BY MOUTH DAILY. Patient taking differently: Take 1 mg by mouth daily. 05/12/20 05/12/21 Yes [name], MD  furosemide (LASIX) 40 MG tablet TAKE 1 TABLET (40 MG TOTAL) BY MOUTH DAILY AS NEEDED FOR EDEMA. Patient taking differently: Take 40 mg by mouth as needed for edema. 05/12/20 05/12/21 Yes [name], MD  spironolactone (ALDACTONE) 100 MG tablet TAKE 1 TABLET (100 MG TOTAL) BY MOUTH DAILY. Patient taking differently: Take 100 mg by mouth daily. 05/12/20 05/12/21 Yes [name], MD  thiamine 100 MG tablet TAKE 1 TABLET (100 MG TOTAL) BY MOUTH DAILY. Patient taking differently: Take 100 mg by mouth daily. 05/12/20 05/12/21 Yes Shan Levans  E, MD  Vitamin D, Ergocalciferol, (DRISDOL) 1.25 MG (50000 UNIT) CAPS capsule TAKE 1 CAPSULE (50,000 UNITS TOTAL) BY MOUTH EVERY 7 (SEVEN) DAYS. Patient taking differently: Take 50,000 Units by mouth every 7 (seven) days. Tuesdays 05/12/20 05/12/21 Yes [name], MD  ferrous sulfate (FERROUSUL) 325 (65 FE) MG tablet Take 1 tablet (325 mg total) by mouth 2 (two) times daily with a meal. Patient not taking: Reported on 04/03/2020 02/03/20 04/03/20  [name], MD  pantoprazole (PROTONIX) 40 MG tablet Take 1 tablet (40 mg total) by mouth daily. Patient not taking: Reported on 04/03/2020  03/31/20 04/03/20  Hoy Register, MD    Allergies    Patient has no known allergies.  Review of Systems   Review of Systems  Constitutional:  Negative for chills and fever.  Respiratory:  Negative for shortness of breath.   Cardiovascular:  Negative for chest pain.  Gastrointestinal:  Positive for abdominal pain. Negative for constipation, diarrhea, nausea and vomiting.  Skin:  Negative for color change.  All other systems reviewed and are negative.  Physical Exam Updated Vital Signs BP 123/75 (BP Location: Right Arm)   Pulse 71   Temp 98.6 F (37 C) (Oral)   Resp 16   SpO2 100%   Physical Exam Vitals and nursing note reviewed.  Constitutional:      General: He is not in acute distress.    Appearance: Normal appearance. He is well-developed. He is not diaphoretic.  HENT:     Head: Normocephalic and atraumatic.  Eyes:     General:        Right eye: No discharge.        Left eye: No discharge.     Pupils: Pupils are equal, round, and reactive to light.  Cardiovascular:     Rate and Rhythm: Normal rate and regular rhythm.     Pulses: Normal pulses.     Heart sounds: Normal heart sounds.  Pulmonary:     Effort: Pulmonary effort is normal. No respiratory distress.     Breath sounds: Normal breath sounds. No wheezing or rales.     Comments: Respirations equal and unlabored, patient able to speak in full sentences, lungs clear to auscultation bilaterally  Abdominal:     General: Bowel sounds are normal. There is no distension.     Palpations: Abdomen is soft. There is no mass.     Tenderness: There is abdominal tenderness in the periumbilical area. There is no guarding.     Hernia: A hernia is present. Hernia is present in the umbilical area.     Comments:  Umbilical hernia present without erythema, mildly tender to palpation with some mild tenderness in the periumbilical region but all other quadrants nontender to palpation  Musculoskeletal:        General: No deformity.      Cervical back: Neck supple.  Skin:    General: Skin is warm and dry.     Capillary Refill: Capillary refill takes less than 2 seconds.  Neurological:     Mental Status: He is alert and oriented to person, place, and time.     Coordination: Coordination normal.     Comments: Speech is clear, able to follow commands Moves extremities without ataxia, coordination intact  Psychiatric:        Mood and Affect: Mood normal.        Behavior: Behavior normal.    ED Results / Procedures / Treatments   Labs (all labs ordered are  listed, but only abnormal results are displayed) Labs Reviewed  COMPREHENSIVE METABOLIC PANEL - Abnormal; Notable for the following components:      Result Value   Glucose, Bld 100 (*)    BUN <5 (*)    Creatinine, Ser 0.53 (*)    Calcium 8.0 (*)    Albumin 2.4 (*)    AST 54 (*)    Alkaline Phosphatase 191 (*)    Total Bilirubin 2.1 (*)    All other components within normal limits  CBC WITH DIFFERENTIAL/PLATELET - Abnormal; Notable for the following components:   WBC 1.9 (*)    RBC 3.38 (*)    Hemoglobin 9.1 (*)    HCT 30.0 (*)    RDW 16.7 (*)    Platelets 35 (*)    Neutro Abs 0.5 (*)    All other components within normal limits  LIPASE, BLOOD  PATHOLOGIST SMEAR REVIEW    EKG None  Radiology CT Abdomen Pelvis W Contrast  Result Date: 07/21/2020 CLINICAL DATA:  Abdominal pain for a few days, initial encounter EXAM: CT ABDOMEN AND PELVIS WITH CONTRAST TECHNIQUE: Multidetector CT imaging of the abdomen and pelvis was performed using the standard protocol following bolus administration of intravenous contrast. CONTRAST:  OMNIPAQUE IOHEXOL 300 MG/ML  SOLN COMPARISON:  04/03/2020 ultrasound, CT from 03/13/2018 FINDINGS: Lower chest: Mild dependent atelectatic changes are noted. Cardiac shadow is enlarged. Hepatobiliary: Fatty infiltration of the liver is seen. The gallbladder is partially decompressed. Some mild surrounding perihepatic ascites is  noted. Pancreas: Unremarkable. No pancreatic ductal dilatation or surrounding inflammatory changes. Spleen: Normal in size without focal abnormality. Adrenals/Urinary Tract: Adrenal glands are within normal limits. Kidneys demonstrate a normal enhancement pattern bilaterally. No renal calculi or obstructive changes are seen. Delayed images demonstrate normal excretion of contrast. Ureters are within normal limits. Bladder is partially distended. Stomach/Bowel: The appendix is within normal limits. Scattered fecal material is noted throughout the colon. No obstructive changes of the small bowel are seen. Stomach is within normal limits. Vascular/Lymphatic: No significant vascular findings are present. No enlarged abdominal or pelvic lymph nodes. Reproductive: Prostate is unremarkable. Other: Mild ascites is noted slightly decreased in the interval from the prior exam in 2020 but new from the recent ultrasound examination. Small fat containing umbilical hernia is noted. Musculoskeletal: No acute or significant osseous findings. IMPRESSION: Fatty liver. Mild ascites new from the recent ultrasound. No other acute abnormality is noted. Electronically Signed   By: Alcide Clever M.D.   On: 07/21/2020 17:16    Procedures Procedures   Medications Ordered in ED Medications  morphine 4 MG/ML injection 4 mg (4 mg Intravenous Given 07/21/20 1604)  ondansetron (ZOFRAN) injection 4 mg (4 mg Intravenous Given 07/21/20 1604)  iohexol (OMNIPAQUE) 300 MG/ML solution 100 mL (100 mLs Intravenous Contrast Given 07/21/20 1634)    ED Course  I have reviewed the triage vital signs and the nursing notes.  Pertinent labs & imaging results that were available during my care of the patient were reviewed by me and considered in my medical decision making (see chart for details).   MDM Rules/Calculators/A&P                         Patient presents to the ED with complaints of abdominal pain surrounding umbilical hernia, no  vomiting, passing gas and stools normally. Patient nontoxic appearing, in no apparent distress, vitals WNL. On exam patient tender to palpation in periumbilical region surrounding noted umbilical  hernia, no peritoneal signs. Will evaluate with labs and CT abdomen pelvis. Analgesics, anti-emetics, and fluids administered.   Additional history obtained:  Additional history obtained from chart review & nursing note review.   Lab Tests:  I Ordered, reviewed, and interpreted labs, which included:  CBC: Patient noted to have pancytopenia with which are chronic but slightly worse than usual white count of 1.9, hemoglobin 9.1, platelets of 35, was seen and evaluated for the similar laboratory derangements by hematology and most of this was felt to be related to anemia of chronic disease and anemia from alcohol abuse and nutrient deficiencies. CMP: No significant electrolyte derangements, renal function at baseline, LFTs at baseline Lipase: WNL    Imaging Studies ordered:  I ordered imaging studies which included CT abdomen pelvis, I independently reviewed, formal radiology impression shows:  Fatty liver, mild ascites, no other acute abnormality noted.  Small fat-containing umbilical hernia without any signs of obstruction or stranding.  ED Course:    On repeat abdominal exam patient remains without peritoneal signs, low suspicion for cholecystitis, pancreatitis, diverticulitis, appendicitis, bowel obstruction/perforation, or other acute surgical process. Patient tolerating PO in the emergency department. Will discharge home with supportive measures.  Patient does have pancytopenia but this is chronic and only slightly worsened, recently saw heme-onc for this, this is felt primarily to be due to patient's cirrhosis and continued drinking, will have him continue to follow-up with hematology and PCP regarding this.  General surgery referral provided for hernia.  I discussed results, treatment plan, need for  PCP follow-up, and return precautions with the patient. Provided opportunity for questions, patient confirmed understanding and is in agreement with plan.  Spanish interpreter used to provide all instructions.   Portions of this note were generated with Scientist, clinical (histocompatibility and immunogenetics)Dragon dictation software. Dictation errors may occur despite best attempts at proofreading.     Final Clinical Impression(s) / ED Diagnoses Final diagnoses:  Umbilical hernia without obstruction and without gangrene  Pancytopenia [facility](HCC)    Rx / DC Orders ED Discharge Orders     None        Legrand RamsFord, Kirstine Jacquin N, PA-C 07/26/20 0023    Gerhard MunchLockwood, Robert, MD 07/26/20 0930

## 2020-07-23 LAB — PATHOLOGIST SMEAR REVIEW

## 2020-11-06 ENCOUNTER — Other Ambulatory Visit (HOSPITAL_COMMUNITY): Payer: Self-pay

## 2020-11-06 ENCOUNTER — Other Ambulatory Visit: Payer: Self-pay | Admitting: Critical Care Medicine

## 2020-11-06 DIAGNOSIS — E559 Vitamin D deficiency, unspecified: Secondary | ICD-10-CM

## 2020-11-06 DIAGNOSIS — K7031 Alcoholic cirrhosis of liver with ascites: Secondary | ICD-10-CM

## 2020-11-06 DIAGNOSIS — K7030 Alcoholic cirrhosis of liver without ascites: Secondary | ICD-10-CM

## 2020-11-06 DIAGNOSIS — K429 Umbilical hernia without obstruction or gangrene: Secondary | ICD-10-CM

## 2020-11-06 MED ORDER — SPIRONOLACTONE 100 MG PO TABS
100.0000 mg | ORAL_TABLET | Freq: Every day | ORAL | 2 refills | Status: DC
  Filled 2020-11-06: qty 30, 30d supply, fill #0

## 2020-11-06 MED ORDER — FOLIC ACID 1 MG PO TABS
1.0000 mg | ORAL_TABLET | Freq: Every day | ORAL | 1 refills | Status: DC
  Filled 2020-11-06: qty 30, 30d supply, fill #0

## 2020-11-06 MED ORDER — FUROSEMIDE 40 MG PO TABS
40.0000 mg | ORAL_TABLET | ORAL | 1 refills | Status: DC | PRN
  Filled 2020-11-06: qty 30, 30d supply, fill #0

## 2020-11-06 MED ORDER — THIAMINE HCL 100 MG PO TABS
100.0000 mg | ORAL_TABLET | Freq: Every day | ORAL | 2 refills | Status: DC
  Filled 2020-11-06: qty 60, 60d supply, fill #0

## 2020-11-06 MED ORDER — VITAMIN D (ERGOCALCIFEROL) 1.25 MG (50000 UNIT) PO CAPS
50000.0000 [IU] | ORAL_CAPSULE | ORAL | 1 refills | Status: DC
Start: 2020-11-06 — End: 2020-11-26
  Filled 2020-11-06: qty 4, 28d supply, fill #0

## 2020-11-10 ENCOUNTER — Ambulatory Visit: Payer: Self-pay | Admitting: Nurse Practitioner

## 2020-11-10 ENCOUNTER — Other Ambulatory Visit (HOSPITAL_COMMUNITY): Payer: Self-pay

## 2020-11-13 ENCOUNTER — Other Ambulatory Visit (HOSPITAL_COMMUNITY): Payer: Self-pay

## 2020-11-15 ENCOUNTER — Encounter (HOSPITAL_COMMUNITY): Payer: Self-pay | Admitting: Emergency Medicine

## 2020-11-15 ENCOUNTER — Other Ambulatory Visit: Payer: Self-pay

## 2020-11-15 ENCOUNTER — Emergency Department (HOSPITAL_COMMUNITY)
Admission: EM | Admit: 2020-11-15 | Discharge: 2020-11-15 | Disposition: A | Discharge status: Home / Self Care | Payer: Self-pay | Attending: Medical | Admitting: Medical

## 2020-11-15 DIAGNOSIS — K7030 Alcoholic cirrhosis of liver without ascites: Secondary | ICD-10-CM | POA: Insufficient documentation

## 2020-11-15 DIAGNOSIS — Z5321 Procedure and treatment not carried out due to patient leaving prior to being seen by health care provider: Secondary | ICD-10-CM | POA: Insufficient documentation

## 2020-11-15 DIAGNOSIS — Y92007 Garden or yard of unspecified non-institutional (private) residence as the place of occurrence of the external cause: Secondary | ICD-10-CM | POA: Insufficient documentation

## 2020-11-15 DIAGNOSIS — R2243 Localized swelling, mass and lump, lower limb, bilateral: Secondary | ICD-10-CM | POA: Insufficient documentation

## 2020-11-15 DIAGNOSIS — Y906 Blood alcohol level of 120-199 mg/100 ml: Secondary | ICD-10-CM | POA: Insufficient documentation

## 2020-11-15 DIAGNOSIS — F10220 Alcohol dependence with intoxication, uncomplicated: Secondary | ICD-10-CM | POA: Insufficient documentation

## 2020-11-15 DIAGNOSIS — R0602 Shortness of breath: Secondary | ICD-10-CM | POA: Insufficient documentation

## 2020-11-15 DIAGNOSIS — R140 Abdominal distension (gaseous): Secondary | ICD-10-CM | POA: Insufficient documentation

## 2020-11-15 HISTORY — DX: Unspecified cirrhosis of liver: K74.60

## 2020-11-15 LAB — ETHANOL: Alcohol, Ethyl (B): 174 mg/dL — ABNORMAL HIGH (ref <10)

## 2020-11-15 LAB — COMPREHENSIVE METABOLIC PANEL
ALT: 25 U/L (ref 0–44)
AST: 67 U/L — ABNORMAL HIGH (ref 15–41)
Albumin: 2.4 g/dL — ABNORMAL LOW (ref 3.5–5.0)
Alkaline Phosphatase: 103 U/L (ref 38–126)
Anion gap: 5 (ref 5–15)
BUN: <5 mg/dL — ABNORMAL LOW (ref 6–20)
CO2: 18 mmol/L — ABNORMAL LOW (ref 22–32)
Calcium: 8 mg/dL — ABNORMAL LOW (ref 8.9–10.3)
Chloride: 112 mmol/L — ABNORMAL HIGH (ref 98–111)
Creatinine, Ser: 0.54 mg/dL — ABNORMAL LOW (ref 0.61–1.24)
GFR, Estimated: >60 mL/min (ref >60)
Glucose, Bld: 107 mg/dL — ABNORMAL HIGH (ref 70–99)
Potassium: 3.3 mmol/L — ABNORMAL LOW (ref 3.5–5.1)
Sodium: 135 mmol/L (ref 135–145)
Total Bilirubin: 2.7 mg/dL — ABNORMAL HIGH (ref 0.3–1.2)
Total Protein: 6.8 g/dL (ref 6.5–8.1)

## 2020-11-15 LAB — CBC WITH DIFFERENTIAL/PLATELET
Abs Immature Granulocytes: 0.01 10*3/uL (ref 0.00–0.07)
Basophils Absolute: 0 10*3/uL (ref 0.0–0.1)
Basophils Relative: 1 %
Eosinophils Absolute: 0 10*3/uL (ref 0.0–0.5)
Eosinophils Relative: 2 %
HCT: 27 % — ABNORMAL LOW (ref 39.0–52.0)
Hemoglobin: 8.2 g/dL — ABNORMAL LOW (ref 13.0–17.0)
Immature Granulocytes: 1 %
Lymphocytes Relative: 30 %
Lymphs Abs: 0.5 10*3/uL — ABNORMAL LOW (ref 0.7–4.0)
MCH: 27.6 pg (ref 26.0–34.0)
MCHC: 30.4 g/dL (ref 30.0–36.0)
MCV: 90.9 fL (ref 80.0–100.0)
Monocytes Absolute: 0.2 10*3/uL (ref 0.1–1.0)
Monocytes Relative: 14 %
Neutro Abs: 0.9 10*3/uL — ABNORMAL LOW (ref 1.7–7.7)
Neutrophils Relative %: 52 %
Platelets: 31 10*3/uL — ABNORMAL LOW (ref 150–400)
RBC: 2.97 MIL/uL — ABNORMAL LOW (ref 4.22–5.81)
RDW: 17.1 % — ABNORMAL HIGH (ref 11.5–15.5)
WBC: 1.7 10*3/uL — ABNORMAL LOW (ref 4.0–10.5)
nRBC: 0 % (ref 0.0–0.2)

## 2020-11-15 LAB — PROTIME-INR
INR: 2.2 — ABNORMAL HIGH (ref 0.8–1.2)
Prothrombin Time: 24.2 seconds — ABNORMAL HIGH (ref 11.4–15.2)

## 2020-11-15 NOTE — ED Triage Notes (Addendum)
[employer] interpreter used for triage.  Pt to triage via [employer].  Pt was arrested after multiple calls for pt being in his neighbor's yard intoxicated.  Pt was taken to jail and they brought him to hospital for abd distention and bilateral lower extremity swelling.  History of cirrhosis.  States symptoms are no worse than usual.  Pt denies pain and denies SOB.  Reports occasional SOB at night while trying to sleep.

## 2020-11-15 NOTE — ED Provider Notes (Signed)
Emergency Medicine Provider Triage Evaluation Note  GATSBY CHISMAR Slovan , a 40 y.o. male  was evaluated in triage.  Pt here with [employer] for medical clearance. They report they were called out 3 times earlier this morning for patient wandering around his neighbors yard being intoxicated.  They ultimately arrested him however he needed to be brought here for medical clearance as he was complaining of leg swelling.  Patient with known history of cirrhosis.  Continues to drink.  States that his swelling is sometimes more severe than this.  He also complains of some swelling in his abdomen.  He denies any fevers or chills.  No nausea or vomiting.  States he is compliant with his Lasix.  Review of Systems  Positive: + leg swelling, abd distention Negative: - fevers, SOB, abd pain  Physical Exam  BP 115/77 (BP Location: Right Arm)   Pulse 80   Temp 97.9 F (36.6 C) (Oral)   Resp 16   SpO2 100%  Gen:   Awake, no distress   Resp:  Normal effort  MSK:   Moves extremities without difficulty  Other:  3+ pitting edema. Abdominal distention; no TTP. Sclera icterus noted.   Medical Decision Making  Medically screening exam initiated at 8:23 AM.  Appropriate orders placed.  Jaikob Borgwardt [name] was informed that the remainder of the evaluation will be completed by another provider, this initial triage assessment does not replace that evaluation, and the importance of remaining in the ED until their evaluation is complete.     Tanda Rockers, PA-C 11/15/20 0825    Virgina Norfolk, DO 11/15/20 [phone]

## 2020-11-15 NOTE — ED Notes (Signed)
Per [employer] pt is refusing medical care and wants to leave.  Pt confirms with translator that he wants to leave.  Hyman Hopes, [place] aware.

## 2020-11-18 ENCOUNTER — Emergency Department (HOSPITAL_COMMUNITY): Payer: Self-pay

## 2020-11-18 ENCOUNTER — Emergency Department (HOSPITAL_COMMUNITY)
Admission: EM | Admit: 2020-11-18 | Discharge: 2020-11-18 | Disposition: A | Discharge status: Home / Self Care | Payer: Self-pay | Attending: Emergency Medicine | Admitting: Emergency Medicine

## 2020-11-18 ENCOUNTER — Encounter (HOSPITAL_COMMUNITY): Payer: Self-pay | Admitting: Emergency Medicine

## 2020-11-18 DIAGNOSIS — K7011 Alcoholic hepatitis with ascites: Secondary | ICD-10-CM | POA: Insufficient documentation

## 2020-11-18 DIAGNOSIS — D61818 Other pancytopenia: Secondary | ICD-10-CM | POA: Insufficient documentation

## 2020-11-18 DIAGNOSIS — Z79899 Other long term (current) drug therapy: Secondary | ICD-10-CM | POA: Insufficient documentation

## 2020-11-18 DIAGNOSIS — K429 Umbilical hernia without obstruction or gangrene: Secondary | ICD-10-CM | POA: Insufficient documentation

## 2020-11-18 DIAGNOSIS — F17210 Nicotine dependence, cigarettes, uncomplicated: Secondary | ICD-10-CM | POA: Insufficient documentation

## 2020-11-18 HISTORY — PX: IR PARACENTESIS: IMG2679

## 2020-11-18 LAB — URINALYSIS, ROUTINE W REFLEX MICROSCOPIC
Bacteria, UA: NONE SEEN
Bilirubin Urine: NEGATIVE
Glucose, UA: NEGATIVE mg/dL
Ketones, ur: NEGATIVE mg/dL
Leukocytes,Ua: NEGATIVE
Nitrite: NEGATIVE
Protein, ur: NEGATIVE mg/dL
Specific Gravity, Urine: 1.006 (ref 1.005–1.030)
pH: 6 (ref 5.0–8.0)

## 2020-11-18 LAB — COMPREHENSIVE METABOLIC PANEL
ALT: 24 U/L (ref 0–44)
AST: 50 U/L — ABNORMAL HIGH (ref 15–41)
Albumin: 2.4 g/dL — ABNORMAL LOW (ref 3.5–5.0)
Alkaline Phosphatase: 141 U/L — ABNORMAL HIGH (ref 38–126)
Anion gap: 6 (ref 5–15)
BUN: 8 mg/dL (ref 6–20)
CO2: 24 mmol/L (ref 22–32)
Calcium: 8 mg/dL — ABNORMAL LOW (ref 8.9–10.3)
Chloride: 105 mmol/L (ref 98–111)
Creatinine, Ser: 0.61 mg/dL (ref 0.61–1.24)
GFR, Estimated: >60 mL/min (ref >60)
Glucose, Bld: 112 mg/dL — ABNORMAL HIGH (ref 70–99)
Potassium: 3.8 mmol/L (ref 3.5–5.1)
Sodium: 135 mmol/L (ref 135–145)
Total Bilirubin: 2.3 mg/dL — ABNORMAL HIGH (ref 0.3–1.2)
Total Protein: 6.8 g/dL (ref 6.5–8.1)

## 2020-11-18 LAB — CBC WITH DIFFERENTIAL/PLATELET
Abs Immature Granulocytes: 0 10*3/uL (ref 0.00–0.07)
Basophils Absolute: 0 10*3/uL (ref 0.0–0.1)
Basophils Relative: 1 %
Eosinophils Absolute: 0 10*3/uL (ref 0.0–0.5)
Eosinophils Relative: 4 %
HCT: 28.6 % — ABNORMAL LOW (ref 39.0–52.0)
Hemoglobin: 8.6 g/dL — ABNORMAL LOW (ref 13.0–17.0)
Lymphocytes Relative: 33 %
Lymphs Abs: 0.4 10*3/uL — ABNORMAL LOW (ref 0.7–4.0)
MCH: 27.5 pg (ref 26.0–34.0)
MCHC: 30.1 g/dL (ref 30.0–36.0)
MCV: 91.4 fL (ref 80.0–100.0)
Monocytes Absolute: 0.1 10*3/uL (ref 0.1–1.0)
Monocytes Relative: 8 %
Myelocytes: 1 %
Neutro Abs: 0.6 10*3/uL — ABNORMAL LOW (ref 1.7–7.7)
Neutrophils Relative %: 52 %
Platelets: 31 10*3/uL — ABNORMAL LOW (ref 150–400)
Promyelocytes Relative: 1 %
RBC: 3.13 MIL/uL — ABNORMAL LOW (ref 4.22–5.81)
RDW: 17.2 % — ABNORMAL HIGH (ref 11.5–15.5)
WBC: 1.2 10*3/uL — CL (ref 4.0–10.5)
nRBC: 0 % (ref 0.0–0.2)

## 2020-11-18 LAB — AMMONIA: Ammonia: 29 umol/L (ref 9–35)

## 2020-11-18 LAB — MAGNESIUM: Magnesium: 1.5 mg/dL — ABNORMAL LOW (ref 1.7–2.4)

## 2020-11-18 LAB — LIPASE, BLOOD: Lipase: 43 U/L (ref 11–51)

## 2020-11-18 LAB — PROTIME-INR
INR: 2 — ABNORMAL HIGH (ref 0.8–1.2)
Prothrombin Time: 22.7 seconds — ABNORMAL HIGH (ref 11.4–15.2)

## 2020-11-18 LAB — ETHANOL: Alcohol, Ethyl (B): 113 mg/dL — ABNORMAL HIGH (ref <10)

## 2020-11-18 MED ORDER — ONDANSETRON HCL 4 MG/2ML IJ SOLN
4.0000 mg | Freq: Once | INTRAMUSCULAR | Status: AC
  Administered 2020-11-18: 4 mg via INTRAVENOUS
  Filled 2020-11-18: qty 2

## 2020-11-18 MED ORDER — IOHEXOL 300 MG/ML  SOLN
80.0000 mL | Freq: Once | INTRAMUSCULAR | Status: AC | PRN
  Administered 2020-11-18: 80 mL via INTRAVENOUS

## 2020-11-18 MED ORDER — LIDOCAINE HCL (PF) 1 % IJ SOLN
INTRAMUSCULAR | Status: DC | PRN
  Administered 2020-11-18: 10 mL

## 2020-11-18 MED ORDER — FENTANYL CITRATE PF 50 MCG/ML IJ SOSY
50.0000 ug | PREFILLED_SYRINGE | Freq: Once | INTRAMUSCULAR | Status: AC
  Administered 2020-11-18: 50 ug via INTRAVENOUS
  Filled 2020-11-18: qty 1

## 2020-11-18 MED ORDER — LIDOCAINE HCL 1 % IJ SOLN
INTRAMUSCULAR | Status: AC
  Filled 2020-11-18: qty 20

## 2020-11-18 MED ORDER — LIDOCAINE HCL (PF) 1 % IJ SOLN
INTRAMUSCULAR | Status: AC
  Filled 2020-11-18: qty 30

## 2020-11-18 NOTE — Procedures (Signed)
PROCEDURE SUMMARY:  Successful image-guided paracentesis from the left lower abdomen.  Yielded 1.6 liters of clear yellow fluid.  No immediate complications.  EBL < 1 mL Patient tolerated well.   Specimen was not sent for labs.  Please see imaging section of Epic for full dictation.  Villa Herb PA-C 11/18/2020 11:49 AM

## 2020-11-18 NOTE — ED Notes (Signed)
Pt transported to IR 

## 2020-11-18 NOTE — Discharge Instructions (Addendum)
Contact a health care provider if: Your hernia gets larger. Your hernia becomes painful. Get help right away if: You develop sudden, severe pain near the area of your hernia. You have pain as well as nausea or vomiting. You have pain and the skin over your hernia changes color. You develop a fever or chills.

## 2020-11-18 NOTE — ED Notes (Signed)
MD Curatolo made aware of critical labs.

## 2020-11-18 NOTE — ED Provider Notes (Signed)
[facility] EMERGENCY DEPARTMENT Provider Note   CSN: [id number] Arrival date & time: 11/18/20  0856     History Chief Complaint  Patient presents with   Abdominal Pain    Mathew [name] [name] is a 40 y.o. male with a past medical history of alcoholic cirrhosis, alcohol dependence, withdrawal seizures, blood loss anemia, paracentesis 3 years ago.  There is a language barrier and professional Spanish translation services utilized.  And patient reports that he always has a swollen abdomen and swelling in his legs.  This is unchanged for months.  He comes in today because he has noticed that his umbilical hernia has gotten larger and is also much more painful now.  He states that pain is worse with touch.  Patient denies symptoms of obstruction such as vomiting.  Patient's aunt is at bedside and tells me outside of the room that he recently had a 10-minute long seizure and has episodes of hallucinations.  She is unsure if he is still drinking but states that he lives with a bunch of men who drink very heavily.   Abdominal Pain     Past Medical History:  Diagnosis Date   Alcohol withdrawal seizure (HCC)    Cirrhosis (HCC)    ETOH abuse    Pneumonia 04/02/2020    Patient Active Problem List   Diagnosis Date Noted   Tobacco use 05/12/2020   Vitamin D deficiency 02/03/2020   Edema due to hypoalbuminemia 12/20/2019   Acute blood loss anemia 03/13/2018   Alcoholic cirrhosis of liver without ascites (HCC) 03/13/2018   ETOH abuse 07/09/2016   Microcytic anemia 07/09/2016   Thrombocytopenia (HCC) 07/09/2016    Past Surgical History:  Procedure Laterality Date   COLONOSCOPY WITH PROPOFOL N/A 03/17/2018   Procedure: COLONOSCOPY WITH PROPOFOL;  Surgeon: Bernette Redbird, MD;  Location: [facility] ENDOSCOPY;  Service: Endoscopy;  Laterality: N/A;   ESOPHAGOGASTRODUODENOSCOPY (EGD) WITH PROPOFOL N/A 03/17/2018   Procedure: ESOPHAGOGASTRODUODENOSCOPY (EGD) WITH PROPOFOL;  Surgeon:  Bernette Redbird, MD;  Location: [facility] ENDOSCOPY;  Service: Endoscopy;  Laterality: N/A;   IR PARACENTESIS  03/14/2018       Family History  Problem Relation Age of Onset   Cancer Neg Hx    Heart disease Neg Hx     Social History   Tobacco Use   Smoking status: Every Day    Packs/day: 0.25    Types: Cigarettes    Start date: 08/11/2019   Smokeless tobacco: Never  Vaping Use   Vaping Use: Never used  Substance Use Topics   Alcohol use: Yes   Drug use: No    Home Medications Prior to Admission medications   Medication Sig Start Date End Date Taking? Authorizing Provider  folic acid (FOLVITE) 1 MG tablet Take 1 tablet (1 mg total) by mouth daily. 11/06/20 11/06/21  [name], MD  furosemide (LASIX) 40 MG tablet Take 1 tablet (40 mg total) by mouth as needed for edema. 11/06/20   [name], MD  spironolactone (ALDACTONE) 100 MG tablet Take 1 tablet (100 mg total) by mouth daily. 11/06/20   [name], MD  thiamine 100 MG tablet Take 1 tablet (100 mg total) by mouth daily. 11/06/20   [name], MD  Vitamin D, Ergocalciferol, (DRISDOL) 1.25 MG (50000 UNIT) CAPS capsule Take 1 capsule (50,000 Units total) by mouth every 7 (seven) days. Tuesdays 11/06/20   [name], MD  ferrous sulfate (FERROUSUL) 325 (65 FE) MG tablet Take 1  tablet (325 mg total) by mouth 2 (two) times daily with a meal. Patient not taking: Reported on 04/03/2020 02/03/20 04/03/20  [name], MD  pantoprazole (PROTONIX) 40 MG tablet Take 1 tablet (40 mg total) by mouth daily. Patient not taking: Reported on 04/03/2020 03/31/20 04/03/20  Hoy Register, MD    Allergies    Patient has no known allergies.  Review of Systems   Review of Systems  Gastrointestinal:  Positive for abdominal pain.  Ten systems reviewed and are negative for acute change, except as noted in the HPI.   Physical Exam Updated Vital Signs There were no vitals taken for this visit.  Physical  Exam Vitals and nursing note reviewed.  Constitutional:      General: He is not in acute distress.    Appearance: He is well-developed. He is not diaphoretic.  HENT:     Head: Normocephalic and atraumatic.  Eyes:     General: Scleral icterus present.     Conjunctiva/sclera: Conjunctivae normal.  Cardiovascular:     Rate and Rhythm: Normal rate and regular rhythm.     Heart sounds: Normal heart sounds.  Pulmonary:     Effort: Pulmonary effort is normal. No respiratory distress.     Breath sounds: Normal breath sounds.  Abdominal:     General: There is distension.     Palpations: Abdomen is soft. There is fluid wave.     Tenderness: There is abdominal tenderness.     Hernia: A hernia is present. Hernia is present in the umbilical area.     Comments: Large, distended abdomen.  Soft.  There is a large umbilical hernia which is reducible.  Patient is exquisitely tender at the base of the umbilicus.   Genitourinary:    Testes:        Right: Swelling not present.        Left: Swelling not present.  Musculoskeletal:     Cervical back: Normal range of motion and neck supple.     Right lower leg: 3+ Edema present.     Left lower leg: 3+ Edema present.  Skin:    General: Skin is warm and dry.     Comments: Multiple bruises all over the patient's body  Neurological:     Mental Status: He is alert.  Psychiatric:        Behavior: Behavior normal.    ED Results / Procedures / Treatments   Labs (all labs ordered are listed, but only abnormal results are displayed) Labs Reviewed - No data to display  EKG None  Radiology No results found.  Procedures Procedures   Medications Ordered in ED Medications - No data to display  ED Course  I have reviewed the triage vital signs and the nursing notes.  Pertinent labs & imaging results that were available during my care of the patient were reviewed by me and considered in my medical decision making (see chart for details).    MDM  Rules/Calculators/A&P                         40 year old male here with history of alcoholic cirrhosis, alcohol dependence and umbilical hernia pain.  He has chronically distended abdomen and lower extremity swelling.  Patient does not have evidence of incarcerated umbilical hernia despite increased size and pain on both clinical examination as well as follow-up CT scan of the abdomen.  Patient was given abdominal paracentesis for relief of some of his pressure  on the abdomen and umbilical hernia.  I ordered and reviewed labs that included a CBC which shows pancytopenia which appears to be chronic.  Patient will certainly need close follow-up with his PCP and hematology.  I suspect his pancytopenia secondary to marrow suppression from his chronic alcohol use.  Patient was counseled that he will likely not be able to have a hernia repair with blood counts as poor as they are.  He will definitely need to follow-up with soon as possible with his PCP, hematology and may consult with surgery about his hernia.  Patient's pain is improved and appears appropriate for discharge at this time with strict return precautions. Final Clinical Impression(s) / ED Diagnoses Final diagnoses:  Alcoholic hepatitis with ascites  Pancytopenia (HCC)  Umbilical hernia without obstruction and without gangrene    Rx / DC Orders ED Discharge Orders     None        Arthor Captain, PA-C 11/18/20 1436    Virgina Norfolk, DO 11/18/20 1520

## 2020-11-18 NOTE — ED Triage Notes (Addendum)
Patient with history of cirrhosis complains of worsening to ascites started twenty two days ago. Patient here requesting  paratenesis evaluation of an abdominal hernia.  Patient alert, oriented, ambulatory, and in no apparent distress at this time.

## 2020-11-18 NOTE — ED Notes (Signed)
Patient verbalizes understanding of discharge instructions. Opportunity for questioning and answers were provided. Armband removed by staff, pt discharged from ED and ambulated to lobby to go home.   

## 2020-11-19 LAB — PATHOLOGIST SMEAR REVIEW

## 2020-11-26 ENCOUNTER — Encounter: Payer: Self-pay | Admitting: Critical Care Medicine

## 2020-11-26 ENCOUNTER — Ambulatory Visit: Payer: Self-pay | Attending: Critical Care Medicine | Admitting: Critical Care Medicine

## 2020-11-26 ENCOUNTER — Other Ambulatory Visit: Payer: Self-pay

## 2020-11-26 VITALS — BP 126/72 | HR 102 | Resp 16 | Wt 162.0 lb

## 2020-11-26 DIAGNOSIS — K429 Umbilical hernia without obstruction or gangrene: Secondary | ICD-10-CM | POA: Insufficient documentation

## 2020-11-26 DIAGNOSIS — K7031 Alcoholic cirrhosis of liver with ascites: Secondary | ICD-10-CM

## 2020-11-26 DIAGNOSIS — F1010 Alcohol abuse, uncomplicated: Secondary | ICD-10-CM

## 2020-11-26 DIAGNOSIS — E559 Vitamin D deficiency, unspecified: Secondary | ICD-10-CM

## 2020-11-26 DIAGNOSIS — D696 Thrombocytopenia, unspecified: Secondary | ICD-10-CM

## 2020-11-26 DIAGNOSIS — D509 Iron deficiency anemia, unspecified: Secondary | ICD-10-CM

## 2020-11-26 DIAGNOSIS — E8809 Other disorders of plasma-protein metabolism, not elsewhere classified: Secondary | ICD-10-CM

## 2020-11-26 DIAGNOSIS — K7030 Alcoholic cirrhosis of liver without ascites: Secondary | ICD-10-CM

## 2020-11-26 DIAGNOSIS — Z720 Tobacco use: Secondary | ICD-10-CM

## 2020-11-26 MED ORDER — SPIRONOLACTONE 100 MG PO TABS
100.0000 mg | ORAL_TABLET | Freq: Every day | ORAL | 2 refills | Status: DC
  Filled 2020-11-26: qty 60, 60d supply, fill #0
  Filled 2021-04-07: qty 30, 30d supply, fill #0

## 2020-11-26 MED ORDER — VITAMIN D (ERGOCALCIFEROL) 1.25 MG (50000 UNIT) PO CAPS
50000.0000 [IU] | ORAL_CAPSULE | ORAL | 1 refills | Status: DC
  Filled 2020-11-26: qty 14, 98d supply, fill #0
  Filled 2021-04-07: qty 12, 84d supply, fill #0

## 2020-11-26 MED ORDER — THIAMINE HCL 100 MG PO TABS
100.0000 mg | ORAL_TABLET | Freq: Every day | ORAL | 2 refills | Status: DC
  Filled 2020-11-26: qty 60, 60d supply, fill #0

## 2020-11-26 MED ORDER — FUROSEMIDE 40 MG PO TABS
40.0000 mg | ORAL_TABLET | ORAL | 1 refills | Status: DC | PRN
  Filled 2020-11-26 – 2021-04-07 (×2): qty 40, 40d supply, fill #0

## 2020-11-26 MED ORDER — FOLIC ACID 1 MG PO TABS
1.0000 mg | ORAL_TABLET | Freq: Every day | ORAL | 1 refills | Status: DC
  Filled 2020-11-26: qty 60, 60d supply, fill #0
  Filled 2021-04-07: qty 30, 30d supply, fill #0

## 2020-11-26 NOTE — Progress Notes (Signed)
Established Patient Office Visit  Subjective:  Patient ID: Mathew Mcclaren [name] male    DOB: 11-08-1980  Age: 40 y.o. MRN: [id number]  CC:  Chief Complaint  Patient presents with   Hernia    HPI Mathew [name] presents for primary care follow-up.  This visit was assisted by Spanish interpreter Jesus [phone]  Patient has longstanding alcohol abuse and also has had alcohol withdrawal seizures in the past.  This patient has history of drinking multiple beers a day he drank 2 beers 2 days ago.  Patient has alcoholic cirrhosis with ascites and portal hypertension as documented abdominal CT scan in the past.  He is on Aldactone and is on furosemide for same.  Patient will need follow-up with gastroenterology.  Patient has significant anxiety syndrome and does like to drink with his friends on a cultural basis.  Patient's been to the emergency room recently with complaints of umbilical hernia.  He does have an umbilical hernia that worsens when the abdominal ascites increases.  Patient was last seen in May of this year.  Patient does need a pneumonia vaccine and he agreed to receive this.  He is already received a flu vaccine October 28.  Patient has no other complaints at this time   Past Medical History:  Diagnosis Date   Alcohol withdrawal seizure ([place])    Cirrhosis ([place])    ETOH abuse    Pneumonia 04/02/2020    Past Surgical History:  Procedure Laterality Date   COLONOSCOPY WITH PROPOFOL N/A 03/17/2018   Procedure: COLONOSCOPY WITH PROPOFOL;  Surgeon: Ronald Lobo, MD;  Location: [facility];  Service: Endoscopy;  Laterality: N/A;   ESOPHAGOGASTRODUODENOSCOPY (EGD) WITH PROPOFOL N/A 03/17/2018   Procedure: ESOPHAGOGASTRODUODENOSCOPY (EGD) WITH PROPOFOL;  Surgeon: Ronald Lobo, MD;  Location: [facility];  Service: Endoscopy;  Laterality: N/A;   IR PARACENTESIS  03/14/2018   IR PARACENTESIS  11/18/2020    Family History  Problem Relation Age of Onset   Cancer Neg Hx     Heart disease Neg Hx     Social History   Socioeconomic History   Marital status: Significant Other    Spouse name: Not on file   Number of children: Not on file   Years of education: Not on file   Highest education level: Not on file  Occupational History   Not on file  Tobacco Use   Smoking status: Every Day    Packs/day: 0.25    Types: Cigarettes    Start date: 08/11/2019   Smokeless tobacco: Never  Vaping Use   Vaping Use: Never used  Substance and Sexual Activity   Alcohol use: Yes   Drug use: No   Sexual activity: Not Currently  Other Topics Concern   Not on file  Social History Narrative   Not on file   Social Determinants of Health   Financial Resource Strain: Not on file  Food Insecurity: Not on file  Transportation Needs: Not on file  Physical Activity: Not on file  Stress: Not on file  Social Connections: Not on file  Intimate Partner Violence: Not on file    Outpatient Medications Prior to Visit  Medication Sig Dispense Refill   folic acid (FOLVITE) 1 MG tablet Take 1 tablet (1 mg total) by mouth daily. 60 tablet 1   furosemide (LASIX) 40 MG tablet Take 1 tablet (40 mg total) by mouth as needed for edema. 40 tablet 1   spironolactone (ALDACTONE) 100 MG tablet Take 1 tablet (  100 mg total) by mouth daily. 60 tablet 2   thiamine 100 MG tablet Take 1 tablet (100 mg total) by mouth daily. 60 tablet 2   Vitamin D, Ergocalciferol, (DRISDOL) 1.25 MG (50000 UNIT) CAPS capsule Take 1 capsule (50,000 Units total) by mouth every 7 (seven) days. Tuesdays 14 capsule 1   No facility-administered medications prior to visit.    No Known Allergies  ROS Review of Systems  Constitutional:  Negative for chills, diaphoresis and fever.  HENT:  Negative for congestion, hearing loss, nosebleeds, sore throat and tinnitus.   Eyes:  Negative for photophobia and redness.  Respiratory:  Negative for cough, shortness of breath, wheezing and stridor.   Cardiovascular:   Negative for chest pain, palpitations and leg swelling.  Gastrointestinal:  Positive for abdominal distention and abdominal pain. Negative for blood in stool, constipation, diarrhea, nausea and vomiting.       Umbilical hernia  Endocrine: Negative for polydipsia.  Genitourinary:  Negative for dysuria, flank pain, frequency, hematuria and urgency.  Musculoskeletal:  Negative for back pain, myalgias and neck pain.  Skin:  Negative for rash.  Allergic/Immunologic: Negative for environmental allergies.  Neurological:  Positive for seizures. Negative for dizziness, tremors, weakness and headaches.  Hematological:  Does not bruise/bleed easily.  Psychiatric/Behavioral:  Negative for sleep disturbance and suicidal ideas. The patient is nervous/anxious.      Objective:    Physical Exam Vitals reviewed.  Constitutional:      Appearance: Normal appearance. He is well-developed. He is not diaphoretic.  HENT:     Head: Normocephalic and atraumatic.     Nose: No nasal deformity, septal deviation, mucosal edema or rhinorrhea.     Right Sinus: No maxillary sinus tenderness or frontal sinus tenderness.     Left Sinus: No maxillary sinus tenderness or frontal sinus tenderness.     Mouth/Throat:     Pharynx: No oropharyngeal exudate.  Eyes:     General: No scleral icterus.    Conjunctiva/sclera: Conjunctivae normal.     Pupils: Pupils are equal, round, and reactive to light.  Neck:     Thyroid: No thyromegaly.     Vascular: No carotid bruit or JVD.     Trachea: Trachea normal. No tracheal tenderness or tracheal deviation.  Cardiovascular:     Rate and Rhythm: Normal rate and regular rhythm.     Chest Wall: PMI is not displaced.     Pulses: Normal pulses. No decreased pulses.     Heart sounds: Normal heart sounds, S1 normal and S2 normal. Heart sounds not distant. No murmur heard. No systolic murmur is present.  No diastolic murmur is present.    No friction rub. No gallop. No S3 or S4 sounds.   Pulmonary:     Effort: No tachypnea, accessory muscle usage or respiratory distress.     Breath sounds: No stridor. No decreased breath sounds, wheezing, rhonchi or rales.  Chest:     Chest wall: No tenderness.  Abdominal:     General: Bowel sounds are normal. There is distension.     Palpations: Abdomen is soft. Abdomen is not rigid.     Tenderness: There is no abdominal tenderness. There is no guarding or rebound.     Hernia: A hernia is present.     Comments: Umbilical hernia present ascites is improved  Musculoskeletal:        General: Normal range of motion.     Cervical back: Normal range of motion and neck supple. No edema,  erythema or rigidity. No muscular tenderness. Normal range of motion.  Lymphadenopathy:     Head:     Right side of head: No submental or submandibular adenopathy.     Left side of head: No submental or submandibular adenopathy.     Cervical: No cervical adenopathy.  Skin:    General: Skin is warm and dry.     Coloration: Skin is not pale.     Findings: No rash.     Nails: There is no clubbing.  Neurological:     Mental Status: He is alert and oriented to person, place, and time.     Sensory: No sensory deficit.  Psychiatric:        Mood and Affect: Mood normal.        Speech: Speech normal.        Behavior: Behavior normal.        Thought Content: Thought content normal.    BP 126/72   Pulse (!) 102   Resp 16   Wt 162 lb (73.5 kg)   SpO2 98%   BMI 30.61 kg/m  Wt Readings from Last 3 Encounters:  11/26/20 162 lb (73.5 kg)  05/12/20 173 lb 6.4 oz (78.7 kg)  04/15/20 159 lb 3.2 oz (72.2 kg)     There are no preventive care reminders to display for this patient.   There are no preventive care reminders to display for this patient.  No results found for: TSH Lab Results  Component Value Date   WBC 1.2 (LL) 11/18/2020   HGB 8.6 (L) 11/18/2020   HCT 28.6 (L) 11/18/2020   MCV 91.4 11/18/2020   PLT 31 (L) 11/18/2020   Lab Results   Component Value Date   NA 135 11/18/2020   K 3.8 11/18/2020   CO2 24 11/18/2020   GLUCOSE 112 (H) 11/18/2020   BUN 8 11/18/2020   CREATININE 0.61 11/18/2020   BILITOT 2.3 (H) 11/18/2020   ALKPHOS 141 (H) 11/18/2020   AST 50 (H) 11/18/2020   ALT 24 11/18/2020   PROT 6.8 11/18/2020   ALBUMIN 2.4 (L) 11/18/2020   CALCIUM 8.0 (L) 11/18/2020   ANIONGAP 6 11/18/2020   EGFR 126 05/12/2020   No results found for: CHOL No results found for: HDL No results found for: LDLCALC No results found for: TRIG No results found for: CHOLHDL Lab Results  Component Value Date   HGBA1C 4.7 (L) 10/14/2019      Assessment & Plan:   Problem List Items Addressed This Visit       Digestive   Alcoholic cirrhosis of liver with ascites ([place]) - Primary    Plan to refill spironolactone and furosemide and obtained referral to gastroenterology      Relevant Medications   folic acid (FOLVITE) 1 MG tablet   furosemide (LASIX) 40 MG tablet   spironolactone (ALDACTONE) 100 MG tablet   thiamine 100 MG tablet   Other Relevant Orders   Ambulatory referral to Gastroenterology     Hematopoietic and Hemostatic   Thrombocytopenia (HCC) (Chronic)    Secondary to liver disease we will monitor        Other   ETOH abuse    Referral to clinical social worker made      Microcytic anemia    Continue folic acid and monitor CBC      Relevant Medications   folic acid (FOLVITE) 1 MG tablet   Edema due to hypoalbuminemia    Albumin level is low which is contributing  to edema      Relevant Medications   furosemide (LASIX) 40 MG tablet   Vitamin D deficiency    Continue weekly supplementation      Relevant Medications   Vitamin D, Ergocalciferol, (DRISDOL) 1.25 MG (50000 UNIT) CAPS capsule   Tobacco use    Not addressed at this visit      Umbilical hernia without obstruction and without gangrene    Surgery not indicated time we will continue observation      Relevant Medications    furosemide (LASIX) 40 MG tablet    Meds ordered this encounter  Medications   folic acid (FOLVITE) 1 MG tablet    Sig: Take 1 tablet (1 mg total) by mouth daily.    Dispense:  60 tablet    Refill:  1   furosemide (LASIX) 40 MG tablet    Sig: Take 1 tablet (40 mg total) by mouth as needed for edema.    Dispense:  40 tablet    Refill:  1   spironolactone (ALDACTONE) 100 MG tablet    Sig: Take 1 tablet (100 mg total) by mouth daily.    Dispense:  60 tablet    Refill:  2   thiamine 100 MG tablet    Sig: Take 1 tablet (100 mg total) by mouth daily.    Dispense:  60 tablet    Refill:  2   Vitamin D, Ergocalciferol, (DRISDOL) 1.25 MG (50000 UNIT) CAPS capsule    Sig: Take 1 capsule (50,000 Units total) by mouth every 7 (seven) days. Tuesdays    Dispense:  14 capsule    Refill:  1   Prevnar 20 pneumonia vaccine given  Follow-up: Return in about 2 months (around 01/26/2021).    Asencion Noble, MD

## 2020-11-26 NOTE — Assessment & Plan Note (Signed)
Referral to clinical social worker made

## 2020-11-26 NOTE — Assessment & Plan Note (Signed)
Albumin level is low which is contributing to edema

## 2020-11-26 NOTE — Assessment & Plan Note (Signed)
Secondary to liver disease we will monitor

## 2020-11-26 NOTE — Assessment & Plan Note (Signed)
Plan to refill spironolactone and furosemide and obtained referral to gastroenterology

## 2020-11-26 NOTE — Patient Instructions (Signed)
No change in medications refill sent to our pharmacy here  Need to stop drinking alcohol completely  Referral to our clinical social worker assigned to be made to discuss with you alcohol counseling  Referral to gastroenterology was made for your liver disease  Pneumonia vaccine was given  Return to see [name] 2 months   No hay cambios en la recarga de medicamentos enviados a nuestra farmacia aqu  Necesita dejar de beber alcohol por completo  Derivacin a nuestro trabajador social clnico asignado para hablar con usted sobre asesoramiento sobre alcohol  Se hizo derivacin a Cytogeneticist por su enfermedad heptica  Se administr vacuna contra la neumona.  Volver a ver al [name] 2 meses

## 2020-11-26 NOTE — Assessment & Plan Note (Signed)
Continue weekly supplementation.

## 2020-11-26 NOTE — Assessment & Plan Note (Signed)
Not addressed at this visit

## 2020-11-26 NOTE — Assessment & Plan Note (Addendum)
Surgery not indicated time we will continue observation

## 2020-11-26 NOTE — Assessment & Plan Note (Signed)
Continue folic acid and monitor CBC

## 2020-12-01 NOTE — Congregational Nurse Program (Signed)
  Dept: [phone]   Congregational Nurse Program Note  Date of Encounter: 12/01/2020  Past Medical History: Past Medical History:  Diagnosis Date   Alcohol withdrawal seizure (HCC)    Cirrhosis (HCC)    ETOH abuse    Pneumonia 04/02/2020    Encounter Details:  CNP Questionnaire - 12/01/20 1630       Questionnaire   Do you give verbal consent to treat you today? Yes    Location Patient Served  Scientist, research (life sciences) or Organization    Patient Status Immigrant    Insurance Uninsured ([employer]/Care Connects/Self-Pay)    Insurance Referral N/A    Medication Provided Medication Assistance;Referred to Medication Assistance    Medical Provider Yes    Screening Referrals Annual Wellness Visit    [facility]    Medical Appointment Made [facility]    Food Have Food Insecurities    Transportation Need transportation assistance    Housing/Utilities No permanent housing    Interpersonal Safety N/A    Intervention Navigate Healthcare System    ED Visit Averted N/A    Life-Saving Intervention Made N/A            Patient was unable to attend [facility] appt [facility].   I called patients clinic and helped him rescheduled his appt. I also gave patient information for [facility].

## 2020-12-01 NOTE — Congregational Nurse Program (Signed)
  Dept: [phone]   Congregational Nurse Program Note  Date of Encounter: 11/03/2020  Past Medical History: Past Medical History:  Diagnosis Date   Alcohol withdrawal seizure (HCC)    Cirrhosis (HCC)    ETOH abuse    Pneumonia 04/02/2020    Encounter Details:  CNP Questionnaire - 12/01/20 1438       Questionnaire   Do you give verbal consent to treat you today? Yes    Location Patient Served  Pensions consultant    Visit Setting Church or Organization    Insurance Uninsured ([employer]/Care Connects/Self-Pay)    Insurance Referral N/A    Medication Provided Medication Assistance;Referred to Medication Assistance    Medical Provider Yes    Screening Referrals Annual Wellness Visit    Medical Referral [facility]    Medical Appointment Made [facility]    Food Have Food Insecurities    Transportation Need transportation assistance    Housing/Utilities No permanent housing    Interpersonal Safety N/A    Intervention Navigate Healthcare System    ED Visit Averted N/A    Life-Saving Intervention Made N/A            Patient was [facility]. [facility] to [facility].

## 2020-12-09 NOTE — Progress Notes (Signed)
Patient ID: Mathew [name], male   DOB: 1980-11-27, 40 y.o.   MRN: [id number]    [facility] Mathew [name], is a 40 y.o. male  YHC:[id number]  DVV:[id number]  DOB - [birth year]  Chief Complaint  Patient presents with   Hip Pain       Subjective:   Mathew [name] Mathew [name] is a 40 y.o. male here today for pain in the L S-I joint.  Pain started suddenly 3 days ago and has not improved.  No urinary s/sx.  He has not taken any OTC meds for it.  He says he has not been drinking alcohol since last visit.  Pain into L LE.  NKI.  Pain started in his L arm then moved into his L S-I joint and L leg.  No numbness but he says he has been walking with a limp.  Blood sugars over the last 8 months have ranged from 80-129.  Pain is constant.  No urinary s/sx  No problems updated.  ALLERGIES: No Known Allergies  PAST MEDICAL HISTORY: Past Medical History:  Diagnosis Date   Alcohol withdrawal seizure (HCC)    Cirrhosis (HCC)    ETOH abuse    Pneumonia 04/02/2020    MEDICATIONS AT HOME: Prior to Admission medications   Medication Sig Start Date End Date Taking? Authorizing Provider  folic acid (FOLVITE) 1 MG tablet Take 1 tablet (1 mg total) by mouth daily. 11/26/20 11/26/21 Yes Mathew Frisk, MD  furosemide (LASIX) 40 MG tablet Take 1 tablet (40 mg total) by mouth as needed for edema. 11/26/20  Yes Mathew Frisk, MD  methocarbamol (ROBAXIN) 500 MG tablet Take 1 tablet (500 mg total) by mouth every 8 (eight) hours as needed for muscle spasms. 12/10/20  Yes Valeriano Bain M, PA-C  predniSONE (DELTASONE) 10 MG tablet Take 6 tablets by mouth on day 1, 5 tablets on day 2, 4 tablets on day 3, 3 tablets on day 4, 2 tablets on day 5, 1 tablet on day 6. Take with food. 12/10/20  Yes Mathew [name] M, PA-C  spironolactone (ALDACTONE) 100 MG tablet Take 1 tablet (100 mg total) by mouth daily. 11/26/20  Yes Mathew Frisk, MD  thiamine 100 MG tablet Take 1 tablet (100 mg total) by mouth daily. 11/26/20   Yes Mathew Frisk, MD  Vitamin D, Ergocalciferol, (DRISDOL) 1.25 MG (50000 UNIT) CAPS capsule Take 1 capsule (50,000 Units total) by mouth every 7 (seven) days. Tuesdays 11/26/20  Yes Mathew Frisk, MD  ferrous sulfate (FERROUSUL) 325 (65 FE) MG tablet Take 1 tablet (325 mg total) by mouth 2 (two) times daily with a meal. Patient not taking: Reported on 04/03/2020 02/03/20 04/03/20  Mathew Frisk, MD  pantoprazole (PROTONIX) 40 MG tablet Take 1 tablet (40 mg total) by mouth daily. Patient not taking: Reported on 04/03/2020 03/31/20 04/03/20  Hoy Register, MD    ROS: Neg HEENT Neg resp Neg cardiac Neg GI Neg GU Neg psych Neg neuro  Objective:   Vitals:   12/10/20 1545 12/10/20 1613  BP: 120/77 120/77  Pulse: 92 92  Resp: 16 16  SpO2: 98% 98%  Weight: 170 lb (77.1 kg) 170 lb (77.1 kg)   Exam General appearance : Awake, alert, not in any distress. Speech Clear. Not toxic looking.  He is walking favoring his R leg and holding the L side of his lower back/hip area HEENT: Atraumatic and Normocephalic Neck: Supple, no JVD. No cervical lymphadenopathy.  Chest: Good air entry  bilaterally, CTAB.  No rales/rhonchi/wheezing CVS: S1 S2 regular, no murmurs.  Back-limited ROM of L hip and back due to pain.  Neg SLR B and LE DTR=intact B.  Sensory intact Extremities: B/L Lower Ext shows no edema, both legs are warm to touch Neurology: Awake alert, and oriented X 3, CN II-XII intact, Non focal Skin: No Rash  Data Review Lab Results  Component Value Date   HGBA1C 4.7 (L) 10/14/2019    Assessment & Plan   1. Sciatica of left side No red flags/no rash.  Blood sugars 80-129 in last 8 months; should be fine with prednisone - ketorolac (TORADOL) injection 60 mg - predniSONE (DELTASONE) 10 MG tablet; Take 6 tablets by mouth on day 1, 5 tablets on day 2, 4 tablets on day 3, 3 tablets on day 4, 2 tablets on day 5, 1 tablet on day 6. Take with food.  Dispense: 21 tablet; Refill: 0 -  methocarbamol (ROBAXIN) 500 MG tablet; Take 1 tablet (500 mg total) by mouth every 8 (eight) hours as needed for muscle spasms.  Dispense: 90 tablet; Refill: 0  2. Muscle spasm - methocarbamol (ROBAXIN) 500 MG tablet; Take 1 tablet (500 mg total) by mouth every 8 (eight) hours as needed for muscle spasms.  Dispense: 90 tablet; Refill: 0  "Antonio" with [employer] interpreters used and additional time performing visit was required. To ED if not improving/worsening  as recommended previously by Dr [name]  Patient have been counseled extensively about nutrition and exercise. Other issues discussed during this visit include: low cholesterol diet, weight control and daily exercise, foot care, annual eye examinations at Ophthalmology, importance of adherence with medications and regular follow-up. We also discussed long term complications of uncontrolled diabetes and hypertension.     The patient was given clear instructions to go to ER or return to medical center if symptoms don't improve, worsen or new problems develop. The patient verbalized understanding. The patient was told to call to get lab results if they haven't heard anything in the next week.      Mathew Co, PA-C [facility] [place], [place] [phone]   12/10/2020, 5:13 PM

## 2020-12-10 ENCOUNTER — Encounter: Payer: Self-pay | Admitting: Physician Assistant

## 2020-12-10 ENCOUNTER — Other Ambulatory Visit: Payer: Self-pay

## 2020-12-10 ENCOUNTER — Ambulatory Visit: Payer: Self-pay | Attending: Physician Assistant | Admitting: Physician Assistant

## 2020-12-10 VITALS — BP 120/77 | HR 92 | Resp 16 | Wt 170.0 lb

## 2020-12-10 DIAGNOSIS — M62838 Other muscle spasm: Secondary | ICD-10-CM

## 2020-12-10 DIAGNOSIS — M5432 Sciatica, left side: Secondary | ICD-10-CM

## 2020-12-10 MED ORDER — KETOROLAC TROMETHAMINE 60 MG/2ML IM SOLN
60.0000 mg | Freq: Once | INTRAMUSCULAR | Status: AC
  Administered 2020-12-10: 60 mg via INTRAMUSCULAR

## 2020-12-10 MED ORDER — PREDNISONE 10 MG PO TABS
ORAL_TABLET | ORAL | 0 refills | Status: DC
  Filled 2020-12-10: qty 21, 6d supply, fill #0

## 2020-12-10 MED ORDER — METHOCARBAMOL 500 MG PO TABS
500.0000 mg | ORAL_TABLET | Freq: Three times a day (TID) | ORAL | 0 refills | Status: DC | PRN
  Filled 2020-12-10: qty 90, 30d supply, fill #0

## 2021-01-13 ENCOUNTER — Ambulatory Visit (HOSPITAL_BASED_OUTPATIENT_CLINIC_OR_DEPARTMENT_OTHER): Payer: Self-pay | Admitting: Critical Care Medicine

## 2021-01-13 NOTE — Progress Notes (Deleted)
Established Patient Office Visit  Subjective:  Patient ID: Mathew [name], male    DOB: 10-Dec-1980  Age: 41 y.o. MRN: [id number]  CC: No chief complaint on file.   HPI Mathew [name] presents for   11/2020 [facility] Mathew [name] presents for primary care follow-up.  This visit was assisted by Spanish interpreter Mathew [name] [phone]   Patient has longstanding alcohol abuse and also has had alcohol withdrawal seizures in the past.  This patient has history of drinking multiple beers a day he drank 2 beers 2 days ago.  Patient has alcoholic cirrhosis with ascites and portal hypertension as documented abdominal CT scan in the past.  He is on Aldactone and is on furosemide for same.  Patient will need follow-up with gastroenterology.  Patient has significant anxiety syndrome and does like to drink with his friends on a cultural basis.   Patient's been to the emergency room recently with complaints of umbilical hernia.  He does have an umbilical hernia that worsens when the abdominal ascites increases.  Patient was last seen in May of this year.  Patient does need a pneumonia vaccine and he agreed to receive this.  He is already received a flu vaccine October 28.   Patient has no other complaints at this time Alcoholic cirrhosis of liver with ascites (HCC) - Primary       Plan to refill spironolactone and furosemide and obtained referral to gastroenterology        Relevant Medications    folic acid (FOLVITE) 1 MG tablet    furosemide (LASIX) 40 MG tablet    spironolactone (ALDACTONE) 100 MG tablet    thiamine 100 MG tablet    Other Relevant Orders    Ambulatory referral to Gastroenterology        Hematopoietic and Hemostatic    Thrombocytopenia (HCC) (Chronic)      Secondary to liver disease we will monitor            Other    ETOH abuse      Referral to clinical social worker made        Microcytic anemia      Continue folic acid and monitor CBC        Relevant  Medications    folic acid (FOLVITE) 1 MG tablet    Edema due to hypoalbuminemia      Albumin level is low which is contributing to edema        Relevant Medications    furosemide (LASIX) 40 MG tablet    Vitamin D deficiency      Continue weekly supplementation        Relevant Medications    Vitamin D, Ergocalciferol, (DRISDOL) 1.25 MG (50000 UNIT) CAPS capsule    Tobacco use      Not addressed at this visit        Umbilical hernia without obstruction and without gangrene      Surgery not indicated time we will continue observation     Past Medical History:  Diagnosis Date   Alcohol withdrawal seizure (HCC)    Cirrhosis (HCC)    ETOH abuse    Pneumonia 04/02/2020    Past Surgical History:  Procedure Laterality Date   COLONOSCOPY WITH PROPOFOL N/A 03/17/2018   Procedure: COLONOSCOPY WITH PROPOFOL;  Surgeon: Bernette Redbird, MD;  Location: [facility] ENDOSCOPY;  Service: Endoscopy;  Laterality: N/A;   ESOPHAGOGASTRODUODENOSCOPY (EGD) WITH PROPOFOL N/A 03/17/2018   Procedure: ESOPHAGOGASTRODUODENOSCOPY (EGD) WITH PROPOFOL;  Surgeon: Bernette Redbird, MD;  Location: [facility] ENDOSCOPY;  Service: Endoscopy;  Laterality: N/A;   IR PARACENTESIS  03/14/2018   IR PARACENTESIS  11/18/2020    Family History  Problem Relation Age of Onset   Cancer Neg Hx    Heart disease Neg Hx     Social History   Socioeconomic History   Marital status: Significant Other    Spouse name: Not on file   Number of children: Not on file   Years of education: Not on file   Highest education level: Not on file  Occupational History   Not on file  Tobacco Use   Smoking status: Every Day    Packs/day: 0.25    Types: Cigarettes    Start date: 08/11/2019   Smokeless tobacco: Never  Vaping Use   Vaping Use: Never used  Substance and Sexual Activity   Alcohol use: Yes   Drug use: No   Sexual activity: Not Currently  Other Topics Concern   Not on file  Social History Narrative   Not on  file   Social Determinants of Health   Financial Resource Strain: Not on file  Food Insecurity: Not on file  Transportation Needs: Not on file  Physical Activity: Not on file  Stress: Not on file  Social Connections: Not on file  Intimate Partner Violence: Not on file    Outpatient Medications Prior to Visit  Medication Sig Dispense Refill   folic acid (FOLVITE) 1 MG tablet Take 1 tablet (1 mg total) by mouth daily. 60 tablet 1   furosemide (LASIX) 40 MG tablet Take 1 tablet (40 mg total) by mouth as needed for edema. 40 tablet 1   methocarbamol (ROBAXIN) 500 MG tablet Take 1 tablet (500 mg total) by mouth every 8 (eight) hours as needed for muscle spasms. 90 tablet 0   predniSONE (DELTASONE) 10 MG tablet Take 6 tablets by mouth on day 1, 5 tablets on day 2, 4 tablets on day 3, 3 tablets on day 4, 2 tablets on day 5, 1 tablet on day 6. Take with food. 21 tablet 0   spironolactone (ALDACTONE) 100 MG tablet Take 1 tablet (100 mg total) by mouth daily. 60 tablet 2   thiamine 100 MG tablet Take 1 tablet (100 mg total) by mouth daily. 60 tablet 2   Vitamin D, Ergocalciferol, (DRISDOL) 1.25 MG (50000 UNIT) CAPS capsule Take 1 capsule (50,000 Units total) by mouth every 7 (seven) days. Tuesdays 14 capsule 1   No facility-administered medications prior to visit.    No Known Allergies  ROS Review of Systems    Objective:    Physical Exam  There were no vitals taken for this visit. Wt Readings from Last 3 Encounters:  12/10/20 170 lb (77.1 kg)  11/26/20 162 lb (73.5 kg)  05/12/20 173 lb 6.4 oz (78.7 kg)     There are no preventive care reminders to display for this patient.  There are no preventive care reminders to display for this patient.  No results found for: TSH Lab Results  Component Value Date   WBC 1.2 (LL) 11/18/2020   HGB 8.6 (L) 11/18/2020   HCT 28.6 (L) 11/18/2020   MCV 91.4 11/18/2020   PLT 31 (L) 11/18/2020   Lab Results  Component Value Date    NA 135 11/18/2020   K 3.8 11/18/2020   CO2 24 11/18/2020   GLUCOSE 112 (H) 11/18/2020   BUN 8 11/18/2020   CREATININE 0.61 11/18/2020  BILITOT 2.3 (H) 11/18/2020   ALKPHOS 141 (H) 11/18/2020   AST 50 (H) 11/18/2020   ALT 24 11/18/2020   PROT 6.8 11/18/2020   ALBUMIN 2.4 (L) 11/18/2020   CALCIUM 8.0 (L) 11/18/2020   ANIONGAP 6 11/18/2020   EGFR 126 05/12/2020   No results found for: CHOL No results found for: HDL No results found for: LDLCALC No results found for: TRIG No results found for: CHOLHDL Lab Results  Component Value Date   HGBA1C 4.7 (L) 10/14/2019      Assessment & Plan:   Problem List Items Addressed This Visit   None   No orders of the defined types were placed in this encounter.   Follow-up: No follow-ups on file.    Asencion Noble, MD

## 2021-01-14 NOTE — Progress Notes (Signed)
Opened in error

## 2021-02-23 ENCOUNTER — Emergency Department (HOSPITAL_COMMUNITY): Payer: Self-pay

## 2021-02-23 ENCOUNTER — Ambulatory Visit: Payer: Self-pay | Admitting: Critical Care Medicine

## 2021-02-23 ENCOUNTER — Emergency Department (HOSPITAL_COMMUNITY)
Admission: EM | Admit: 2021-02-23 | Discharge: 2021-02-23 | Disposition: A | Discharge status: Home / Self Care | Payer: Self-pay | Attending: Emergency Medicine | Admitting: Emergency Medicine

## 2021-02-23 ENCOUNTER — Other Ambulatory Visit: Payer: Self-pay

## 2021-02-23 DIAGNOSIS — Z20822 Contact with and (suspected) exposure to covid-19: Secondary | ICD-10-CM | POA: Insufficient documentation

## 2021-02-23 DIAGNOSIS — K7030 Alcoholic cirrhosis of liver without ascites: Secondary | ICD-10-CM

## 2021-02-23 DIAGNOSIS — R1033 Periumbilical pain: Secondary | ICD-10-CM | POA: Insufficient documentation

## 2021-02-23 DIAGNOSIS — M25559 Pain in unspecified hip: Secondary | ICD-10-CM | POA: Insufficient documentation

## 2021-02-23 DIAGNOSIS — K8020 Calculus of gallbladder without cholecystitis without obstruction: Secondary | ICD-10-CM | POA: Insufficient documentation

## 2021-02-23 DIAGNOSIS — G8929 Other chronic pain: Secondary | ICD-10-CM

## 2021-02-23 DIAGNOSIS — R630 Anorexia: Secondary | ICD-10-CM | POA: Insufficient documentation

## 2021-02-23 DIAGNOSIS — R531 Weakness: Secondary | ICD-10-CM | POA: Insufficient documentation

## 2021-02-23 DIAGNOSIS — R350 Frequency of micturition: Secondary | ICD-10-CM | POA: Insufficient documentation

## 2021-02-23 DIAGNOSIS — R1011 Right upper quadrant pain: Secondary | ICD-10-CM

## 2021-02-23 LAB — CBC WITH DIFFERENTIAL/PLATELET
Abs Immature Granulocytes: 0 10*3/uL (ref 0.00–0.07)
Basophils Absolute: 0 10*3/uL (ref 0.0–0.1)
Basophils Relative: 0 %
Eosinophils Absolute: 0 10*3/uL (ref 0.0–0.5)
Eosinophils Relative: 1 %
HCT: 35.9 % — ABNORMAL LOW (ref 39.0–52.0)
Hemoglobin: 11.1 g/dL — ABNORMAL LOW (ref 13.0–17.0)
Immature Granulocytes: 0 %
Lymphocytes Relative: 49 %
Lymphs Abs: 1.3 10*3/uL (ref 0.7–4.0)
MCH: 26.7 pg (ref 26.0–34.0)
MCHC: 30.9 g/dL (ref 30.0–36.0)
MCV: 86.3 fL (ref 80.0–100.0)
Monocytes Absolute: 0.4 10*3/uL (ref 0.1–1.0)
Monocytes Relative: 14 %
Neutro Abs: 1 10*3/uL — ABNORMAL LOW (ref 1.7–7.7)
Neutrophils Relative %: 36 %
Platelets: 37 10*3/uL — ABNORMAL LOW (ref 150–400)
RBC: 4.16 MIL/uL — ABNORMAL LOW (ref 4.22–5.81)
RDW: 15.2 % (ref 11.5–15.5)
WBC: 2.6 10*3/uL — ABNORMAL LOW (ref 4.0–10.5)
nRBC: 0 % (ref 0.0–0.2)

## 2021-02-23 LAB — COMPREHENSIVE METABOLIC PANEL
ALT: 27 U/L (ref 0–44)
AST: 73 U/L — ABNORMAL HIGH (ref 15–41)
Albumin: 2.7 g/dL — ABNORMAL LOW (ref 3.5–5.0)
Alkaline Phosphatase: 129 U/L — ABNORMAL HIGH (ref 38–126)
Anion gap: 9 (ref 5–15)
BUN: 5 mg/dL — ABNORMAL LOW (ref 6–20)
CO2: 25 mmol/L (ref 22–32)
Calcium: 8 mg/dL — ABNORMAL LOW (ref 8.9–10.3)
Chloride: 104 mmol/L (ref 98–111)
Creatinine, Ser: 0.57 mg/dL — ABNORMAL LOW (ref 0.61–1.24)
GFR, Estimated: >60 mL/min (ref >60)
Glucose, Bld: 154 mg/dL — ABNORMAL HIGH (ref 70–99)
Potassium: 3.8 mmol/L (ref 3.5–5.1)
Sodium: 138 mmol/L (ref 135–145)
Total Bilirubin: 2.5 mg/dL — ABNORMAL HIGH (ref 0.3–1.2)
Total Protein: 7 g/dL (ref 6.5–8.1)

## 2021-02-23 LAB — URINALYSIS, ROUTINE W REFLEX MICROSCOPIC
Bilirubin Urine: NEGATIVE
Glucose, UA: NEGATIVE mg/dL
Ketones, ur: NEGATIVE mg/dL
Leukocytes,Ua: NEGATIVE
Nitrite: NEGATIVE
Protein, ur: NEGATIVE mg/dL
Specific Gravity, Urine: 1.01 (ref 1.005–1.030)
pH: 7 (ref 5.0–8.0)

## 2021-02-23 LAB — RESP PANEL BY RT-PCR (FLU A&B, COVID) ARPGX2
Influenza A by PCR: NEGATIVE
Influenza B by PCR: NEGATIVE
SARS Coronavirus 2 by RT PCR: NEGATIVE

## 2021-02-23 LAB — ETHANOL: Alcohol, Ethyl (B): 365 mg/dL (ref <10)

## 2021-02-23 LAB — LIPASE, BLOOD: Lipase: 48 U/L (ref 11–51)

## 2021-02-23 MED ORDER — SODIUM CHLORIDE 0.9 % IV BOLUS
1000.0000 mL | Freq: Once | INTRAVENOUS | Status: AC
  Administered 2021-02-23: 1000 mL via INTRAVENOUS

## 2021-02-23 MED ORDER — ONDANSETRON HCL 4 MG/2ML IJ SOLN
4.0000 mg | Freq: Once | INTRAMUSCULAR | Status: AC
  Administered 2021-02-23: 4 mg via INTRAVENOUS
  Filled 2021-02-23: qty 2

## 2021-02-23 MED ORDER — MORPHINE SULFATE (PF) 4 MG/ML IV SOLN
4.0000 mg | Freq: Once | INTRAVENOUS | Status: AC
  Administered 2021-02-23: 4 mg via INTRAVENOUS
  Filled 2021-02-23: qty 1

## 2021-02-23 NOTE — ED Provider Triage Note (Signed)
Emergency Medicine Provider Triage Evaluation Note  Mathew [name] , a 41 y.o. male  was evaluated in triage.  Pt complains of fever, some pain in the bones, pressure when he lies down, and lack of appetite for the last 2 days.  He denies any fall or injury.  Patient reports that he has been smoking heavily, drinking heavily.  He does have a history of alcohol abuse, as well as cirrhosis.  Review of Systems  Positive: Bone pain, fatigue, fever, anorexia Negative: Chest pain  Physical Exam  BP (!) 148/86 (BP Location: Right Arm)    Pulse (!) 103    Temp 97.8 F (36.6 C) (Oral)    Resp 16    SpO2 100%  Gen:   Awake, no distress   Resp:  Normal effort  MSK:   Moves extremities without difficulty  Other:  Patient with some yellowing of skin, nails, eyes. Endorses vague TTP abdomen  Medical Decision Making  Medically screening exam initiated at 10:40 AM.  Appropriate orders placed.  Mathew [name] was informed that the remainder of the evaluation will be completed by another provider, this initial triage assessment does not replace that evaluation, and the importance of remaining in the ED until their evaluation is complete.  Workup initiated   Anselmo Pickler, [place] 02/23/21 1041

## 2021-02-23 NOTE — ED Notes (Signed)
Critical ETOH  365 reported to [employer] primary RN

## 2021-02-23 NOTE — ED Provider Notes (Signed)
[facility] EMERGENCY DEPARTMENT Provider Note   CSN: [id number] Arrival date & time: 02/23/21  1013     History  Chief Complaint  Patient presents with   Weakness   Hip Pain   Fever    EPHRIAM TURMAN [name] is a 41 y.o. male with a past medical history of pancytopenia, alcohol withdrawal seizures, alcohol cirrhosis, presents emergency department with a chief complaint of generalized weakness, subjective fevers, decreased appetite, and abdominal pain.  Patient reports that he has had the symptoms over the last 2 to 3 days.  Abdominal pain is located to right flank and his umbilicus.  Patient rates pain 7/10 the pain scale.  Denies any radiation of pain.  Pain is worse with touch and certain movements.  Patient endorses subjective fevers, generalized weakness, urinary frequency and nausea.    Denies any rhinorrhea, nasal congestion, cough, blood in stool, melena  When asked patient reports that he drank one 12 ounce beer earlier this morning.  Patient reported to triage provider that he has been drinking heavily over the last few days.    Weakness Associated symptoms: abdominal pain, fever (subjective), frequency and nausea   Associated symptoms: no chest pain, no diarrhea, no dizziness, no dysuria, no headaches, no shortness of breath, no urgency and no vomiting   Hip Pain Associated symptoms include abdominal pain. Pertinent negatives include no chest pain, no headaches and no shortness of breath.  Fever Associated symptoms: nausea   Associated symptoms: no chest pain, no chills, no confusion, no diarrhea, no dysuria, no headaches, no rash and no vomiting       Home Medications Prior to Admission medications   Medication Sig Start Date End Date Taking? Authorizing Provider  folic acid (FOLVITE) 1 MG tablet Take 1 tablet (1 mg total) by mouth daily. 11/26/20 11/26/21  [name], MD  furosemide (LASIX) 40 MG tablet Take 1 tablet (40 mg total) by mouth  as needed for edema. 11/26/20   [name], MD  methocarbamol (ROBAXIN) 500 MG tablet Take 1 tablet (500 mg total) by mouth every 8 (eight) hours as needed for muscle spasms. 12/10/20   Argentina Donovan, PA-C  predniSONE (DELTASONE) 10 MG tablet Take 6 tablets by mouth on day 1, 5 tablets on day 2, 4 tablets on day 3, 3 tablets on day 4, 2 tablets on day 5, 1 tablet on day 6. Take with food. 12/10/20   Argentina Donovan, PA-C  spironolactone (ALDACTONE) 100 MG tablet Take 1 tablet (100 mg total) by mouth daily. 11/26/20   [name], MD  thiamine 100 MG tablet Take 1 tablet (100 mg total) by mouth daily. 11/26/20   [name], MD  Vitamin D, Ergocalciferol, (DRISDOL) 1.25 MG (50000 UNIT) CAPS capsule Take 1 capsule (50,000 Units total) by mouth every 7 (seven) days. Tuesdays 11/26/20   [name], MD  ferrous sulfate (FERROUSUL) 325 (65 FE) MG tablet Take 1 tablet (325 mg total) by mouth 2 (two) times daily with a meal. Patient not taking: Reported on 04/03/2020 02/03/20 04/03/20  [name], MD  pantoprazole (PROTONIX) 40 MG tablet Take 1 tablet (40 mg total) by mouth daily. Patient not taking: Reported on 04/03/2020 03/31/20 04/03/20  Charlott Rakes, MD      Allergies    Patient has no known allergies.    Review of Systems   Review of Systems  Constitutional:  Positive for fever (subjective). Negative for chills.  Eyes:  Negative for visual disturbance.  Respiratory:  Negative for shortness of breath.   Cardiovascular:  Negative for chest pain.  Gastrointestinal:  Positive for abdominal pain and nausea. Negative for abdominal distention, anal bleeding, blood in stool, constipation, diarrhea, rectal pain and vomiting.  Genitourinary:  Positive for frequency. Negative for difficulty urinating, dysuria, flank pain, genital sores, hematuria, penile discharge, penile pain, penile swelling, scrotal swelling, testicular pain and urgency.  Musculoskeletal:  Negative for  back pain and neck pain.  Skin:  Negative for color change and rash.  Neurological:  Positive for weakness. Negative for dizziness, syncope, light-headedness and headaches.  Psychiatric/Behavioral:  Negative for confusion.    Physical Exam Updated Vital Signs BP (!) 148/86 (BP Location: Right Arm)    Pulse (!) 103    Temp 97.8 F (36.6 C) (Oral)    Resp 16    SpO2 100%  Physical Exam Vitals and nursing note reviewed.  Constitutional:      General: He is not in acute distress.    Appearance: He is not ill-appearing, toxic-appearing or diaphoretic.  HENT:     Head: Normocephalic.  Eyes:     General: No scleral icterus.       Right eye: No discharge.        Left eye: No discharge.  Cardiovascular:     Rate and Rhythm: Normal rate.  Pulmonary:     Effort: Pulmonary effort is normal.  Abdominal:     General: Abdomen is flat. Bowel sounds are normal. There is no distension. There are no signs of injury.     Palpations: Abdomen is soft. There is no mass or pulsatile mass.     Tenderness: There is no abdominal tenderness. There is right CVA tenderness. There is no left CVA tenderness, guarding or rebound.     Hernia: There is no hernia in the umbilical area or ventral area.  Skin:    General: Skin is warm and dry.  Neurological:     General: No focal deficit present.     Mental Status: He is alert.  Psychiatric:        Behavior: Behavior is cooperative.    ED Results / Procedures / Treatments   Labs (all labs ordered are listed, but only abnormal results are displayed) Labs Reviewed  CBC WITH DIFFERENTIAL/PLATELET - Abnormal; Notable for the following components:      Result Value   WBC 2.6 (*)    RBC 4.16 (*)    Hemoglobin 11.1 (*)    HCT 35.9 (*)    Platelets 37 (*)    Neutro Abs 1.0 (*)    All other components within normal limits  COMPREHENSIVE METABOLIC PANEL - Abnormal; Notable for the following components:   Glucose, Bld 154 (*)    BUN 5 (*)    Creatinine, Ser  0.57 (*)    Calcium 8.0 (*)    Albumin 2.7 (*)    AST 73 (*)    Alkaline Phosphatase 129 (*)    Total Bilirubin 2.5 (*)    All other components within normal limits  ETHANOL - Abnormal; Notable for the following components:   Alcohol, Ethyl (B) 365 (*)    All other components within normal limits  RESP PANEL BY RT-PCR (FLU A&B, COVID) ARPGX2  LIPASE, BLOOD  URINALYSIS, ROUTINE W REFLEX MICROSCOPIC    EKG EKG Interpretation  Date/Time:  Tuesday February 23 2021 10:46:34 EST Ventricular Rate:  92 PR Interval:  148 QRS Duration: 98 QT Interval:  386 QTC Calculation: 477 R Axis:   -12 Text Interpretation: Normal sinus rhythm Septal infarct , age undetermined Abnormal ECG when compared to prior, similar appearance. No STEMI Confirmed by Antony Blackbird [id number] on 02/23/2021 11:22:37 AM  Radiology DG Chest 2 View  Result Date: 02/23/2021 CLINICAL DATA:  Short of breath EXAM: CHEST - 2 VIEW COMPARISON:  04/02/2020 FINDINGS: The heart size and mediastinal contours are within normal limits. Both lungs are clear. The visualized skeletal structures are unremarkable. IMPRESSION: No active cardiopulmonary disease. Electronically Signed   By: Franchot Gallo M.D.   On: 02/23/2021 11:17    Procedures Procedures    Medications Ordered in ED Medications  ondansetron (ZOFRAN) injection 4 mg (has no administration in time range)  morphine (PF) 4 MG/ML injection 4 mg (has no administration in time range)    ED Course/ Medical Decision Making/ A&P                           Medical Decision Making Amount and/or Complexity of Data Reviewed Labs: ordered. Radiology: ordered.  Risk Prescription drug management.   Alert 41 year old male in no acute distress, nontoxic-appearing.  Presents to the emergency department with a chief complaint of subjective fevers, decreased p.o. intake, abdominal pain.  Information was obtained from patient.  Translator was used to conduct this interview.  Past  medical history was reviewed including previous provider notes, lab results, and imaging.  Patient has past medical history of alcoholic cirrhosis which complicates his care.  On exam patient's abdomen soft, nondistended, tenderness to suprapubic area as well as right flank.  Will obtain CT renal study to evaluate for renal calculus.  Lab imaging was ordered and evaluated myself.  Pertinent findings include: -Ethanol elevated at 365 -Total bili, alk phos, AST; appears to be at patient's baseline. -Pancytopenia; improved from previous lab results -Respiratory panel negative for COVID-19 or influenza -UA pending  chest x-ray was independently reviewed myself and shows shows no active cardiopulmonary disease.  EKG was independently reviewed myself and shows sinus rhythm.  CT renal study showed cholelithiasis with distended gallbladder, small to moderate volume ascites, no evidence of urinary stone disease.  Patient has no tenderness to right upper quadrant however due to finding of CT renal study will obtain ultrasound imaging to further evaluate.  At time of handoff ultrasound imaging and urinalysis are pending.  Discharge pending these results.  Patient will need to follow-up with hematology in outpatient setting, he reports that he had not followed up with hematology after previously being seen for pancytopenia.  I have placed an ambulatory referral to hematology.  Additionally patient would benefit from following up with his primary care provider for further management of his alcohol use and alcoholic cirrhosis.  Patient care transferred to [facility] at the end of my shift. Patient presentation, ED course, and plan of care discussed with review of all pertinent labs and imaging. Please see his/her note for further details regarding further ED course and disposition.         Final Clinical Impression(s) / ED Diagnoses Final diagnoses:  Cholelithiasis    Rx / DC Orders ED  Discharge Orders     None         Dyann Ruddle 02/23/21 1535    Tegeler, Gwenyth Allegra, MD 02/24/21 [phone]

## 2021-02-23 NOTE — ED Notes (Signed)
Pt has 2+ right pedal pulse, cap refill less than 3 sec, sensation intact, warm to touch.

## 2021-02-23 NOTE — ED Provider Notes (Signed)
Care assumed from Mathew [name] at shift change, please see their note for full detail, but in brief Mathew [name] is a 41 y.o. male presents with RUQ pain and weakness.  Plan: Pending RUQ ultrasound  Physical Exam  BP 123/70 (BP Location: Left Arm)    Pulse 85    Temp 98.8 F (37.1 C) (Oral)    Resp 16    SpO2 100%   Physical Exam  Procedures  Procedures  ED Course / MDM    Medical Decision Making Amount and/or Complexity of Data Reviewed Labs: ordered. Radiology: ordered.  Risk Prescription drug management.   Patient's right upper quadrant ultrasound was negative for cholecystitis.  There was some small amounts of sludge and stones, however nonobstructive.  I visualized this myself and agree with radiologist interpretation.  I discussed the results with the patient and also expressed concern about his pancytopenia.  Mathew [name] had already placed an ambulatory referral to hematology and I discussed this with patient and encouraged him to follow-up.  Strongly encouraged him to discontinue drinking.  He is to follow-up with his primary care doctor to discuss plan and resources to discontinue drinking.  Patient had all questions asked and answered.  Return precautions were discussed and he was discharged home in good condition.   Mathew [name], [place] 02/23/21 1744    Benjiman Core, MD 02/23/21 2350

## 2021-02-23 NOTE — Progress Notes (Deleted)
° °Established Patient Office Visit ° °Subjective:  °Patient ID: Mathew [name], male    DOB: 03/01/1980  Age: 40 y.o. MRN: [id number] ° °CC: No chief complaint on file. ° ° °HPI °Mathew [name] presents for *** ° °Past Medical History:  °Diagnosis Date  ° Alcohol withdrawal seizure (HCC)   ° Cirrhosis (HCC)   ° ETOH abuse   ° Pneumonia 04/02/2020  ° ° °Past Surgical History:  °Procedure Laterality Date  ° COLONOSCOPY WITH PROPOFOL N/A 03/17/2018  ° Procedure: COLONOSCOPY WITH PROPOFOL;  Surgeon: Buccini, Robert, MD;  Location: [facility] ENDOSCOPY;  Service: Endoscopy;  Laterality: N/A;  ° ESOPHAGOGASTRODUODENOSCOPY (EGD) WITH PROPOFOL N/A 03/17/2018  ° Procedure: ESOPHAGOGASTRODUODENOSCOPY (EGD) WITH PROPOFOL;  Surgeon: Buccini, Robert, MD;  Location: [facility] ENDOSCOPY;  Service: Endoscopy;  Laterality: N/A;  ° IR PARACENTESIS  03/14/2018  ° IR PARACENTESIS  11/18/2020  ° ° °Family History  °Problem Relation Age of Onset  ° Cancer Neg Hx   ° Heart disease Neg Hx   ° ° °Social History  ° °Socioeconomic History  ° Marital status: Significant Other  °  Spouse name: Not on file  ° Number of children: Not on file  ° Years of education: Not on file  ° Highest education level: Not on file  °Occupational History  ° Not on file  °Tobacco Use  ° Smoking status: Every Day  °  Packs/day: 0.25  °  Types: Cigarettes  °  Start date: 08/11/2019  ° Smokeless tobacco: Never  °Vaping Use  ° Vaping Use: Never used  °Substance and Sexual Activity  ° Alcohol use: Yes  ° Drug use: No  ° Sexual activity: Not Currently  °Other Topics Concern  ° Not on file  °Social History Narrative  ° Not on file  ° °Social Determinants of Health  ° °Financial Resource Strain: Not on file  °Food Insecurity: Not on file  °Transportation Needs: Not on file  °Physical Activity: Not on file  °Stress: Not on file  °Social Connections: Not on file  °Intimate Partner Violence: Not on file  ° ° °Outpatient Medications Prior to Visit  °Medication Sig Dispense Refill  °  folic acid (FOLVITE) 1 MG tablet Take 1 tablet (1 mg total) by mouth daily. 60 tablet 1  ° furosemide (LASIX) 40 MG tablet Take 1 tablet (40 mg total) by mouth as needed for edema. 40 tablet 1  ° methocarbamol (ROBAXIN) 500 MG tablet Take 1 tablet (500 mg total) by mouth every 8 (eight) hours as needed for muscle spasms. 90 tablet 0  ° predniSONE (DELTASONE) 10 MG tablet Take 6 tablets by mouth on day 1, 5 tablets on day 2, 4 tablets on day 3, 3 tablets on day 4, 2 tablets on day 5, 1 tablet on day 6. Take with food. 21 tablet 0  ° spironolactone (ALDACTONE) 100 MG tablet Take 1 tablet (100 mg total) by mouth daily. 60 tablet 2  ° thiamine 100 MG tablet Take 1 tablet (100 mg total) by mouth daily. 60 tablet 2  ° Vitamin D, Ergocalciferol, (DRISDOL) 1.25 MG (50000 UNIT) CAPS capsule Take 1 capsule (50,000 Units total) by mouth every 7 (seven) days. Tuesdays 14 capsule 1  ° °No facility-administered medications prior to visit.  ° ° °No Known Allergies ° °ROS °Review of Systems ° °  °Objective:  °  °Physical Exam ° °There were no vitals taken for this visit. °Wt Readings from Last 3 Encounters:  °12/10/20 170 lb (77.1   kg)  °11/26/20 162 lb (73.5 kg)  °05/12/20 173 lb 6.4 oz (78.7 kg)  ° ° ° °There are no preventive care reminders to display for this patient. ° °There are no preventive care reminders to display for this patient. ° °No results found for: TSH °Lab Results  °Component Value Date  ° WBC 1.2 (LL) 11/18/2020  ° HGB 8.6 (L) 11/18/2020  ° HCT 28.6 (L) 11/18/2020  ° MCV 91.4 11/18/2020  ° PLT 31 (L) 11/18/2020  ° °Lab Results  °Component Value Date  ° NA 135 11/18/2020  ° K 3.8 11/18/2020  ° CO2 24 11/18/2020  ° GLUCOSE 112 (H) 11/18/2020  ° BUN 8 11/18/2020  ° CREATININE 0.61 11/18/2020  ° BILITOT 2.3 (H) 11/18/2020  ° ALKPHOS 141 (H) 11/18/2020  ° AST 50 (H) 11/18/2020  ° ALT 24 11/18/2020  ° PROT 6.8 11/18/2020  ° ALBUMIN 2.4 (L) 11/18/2020  ° CALCIUM 8.0 (L) 11/18/2020  ° ANIONGAP 6 11/18/2020  ° EGFR 126  05/12/2020  ° °No results found for: CHOL °No results found for: HDL °No results found for: LDLCALC °No results found for: TRIG °No results found for: CHOLHDL °Lab Results  °Component Value Date  ° HGBA1C 4.7 (L) 10/14/2019  ° ° °  °Assessment & Plan:  ° °Problem List Items Addressed This Visit   °None ° ° °No orders of the defined types were placed in this encounter. ° ° °Follow-up: No follow-ups on file.  ° ° °Patrick Wright, MD °

## 2021-02-23 NOTE — ED Triage Notes (Signed)
Translator/pt stated, Ive had fever for the last 4 nights, I have pain at the rt. Hip area. I have pressure when I lay down. I cant tolerate food for the last 2 days.

## 2021-02-23 NOTE — Discharge Instructions (Addendum)
Your work-up today shows stable or slightly worsening cirrhosis of your liver.  Additionally there is some stones in your gallbladder, however the gallbladder itself does not seem infected at this time.  All of your blood counts, red blood cells, white blood cells and platelets were all pretty low today.  You have been told this in the past, but I really recommend that you follow-up with the hematologist referral provided in order to better evaluate this.  I strongly recommend that you discontinue drinking as this is the likely cause of most of your current symptoms at this time.  Follow-up with your primary care provider as needed.  Return to the emergency department if you have worsening pain in your abdomen or fevers.  Su examen de hoy muestra una cirrosis heptica estable o que empeora levemente. Adems, hay algunas piedras en la vescula biliar, sin embargo, la vescula biliar en s no parece estar infectada en este momento. Todos sus recuentos sanguneos, glbulos rojos, glbulos blancos y plaquetas estaban bastante bajos hoy. Le han dicho esto en el pasado, pero realmente le recomiendo que haga un seguimiento con la derivacin del hematlogo proporcionada para evaluar mejor esto. Le recomiendo encarecidamente que deje de beber, ya que esta es la causa probable de la [place] de sus sntomas actuales en este momento. Seguimiento con su proveedor de atencin primaria segn sea necesario. Regrese al departamento de emergencias si tiene empeoramiento del dolor en el abdomen o fiebre.

## 2021-02-24 ENCOUNTER — Telehealth: Payer: Self-pay | Admitting: Hematology and Oncology

## 2021-02-24 NOTE — Telephone Encounter (Signed)
Scheduled appt per 2/14 referral. Pt is already established with Dr. Leonides Schanz. Called pt, no answer and no vm was set up. Mailed updated calendar to pt with appt information.

## 2021-02-26 ENCOUNTER — Encounter (HOSPITAL_COMMUNITY): Payer: Self-pay | Admitting: Emergency Medicine

## 2021-02-26 ENCOUNTER — Emergency Department (HOSPITAL_COMMUNITY)
Admission: EM | Admit: 2021-02-26 | Discharge: 2021-02-27 | Disposition: A | Discharge status: Home / Self Care | Payer: Self-pay | Attending: Emergency Medicine | Admitting: Emergency Medicine

## 2021-02-26 ENCOUNTER — Other Ambulatory Visit: Payer: Self-pay

## 2021-02-26 DIAGNOSIS — D61818 Other pancytopenia: Secondary | ICD-10-CM | POA: Insufficient documentation

## 2021-02-26 DIAGNOSIS — R1010 Upper abdominal pain, unspecified: Secondary | ICD-10-CM

## 2021-02-26 DIAGNOSIS — K429 Umbilical hernia without obstruction or gangrene: Secondary | ICD-10-CM | POA: Insufficient documentation

## 2021-02-26 NOTE — ED Triage Notes (Signed)
Patient here with abdominal pain, patient has a hernia and was seen three days ago for same.  No nausea or vomiting, patient pointed to epigastric area.  No shortness of breath.

## 2021-02-27 LAB — COMPREHENSIVE METABOLIC PANEL
ALT: 23 U/L (ref 0–44)
AST: 50 U/L — ABNORMAL HIGH (ref 15–41)
Albumin: 2.3 g/dL — ABNORMAL LOW (ref 3.5–5.0)
Alkaline Phosphatase: 151 U/L — ABNORMAL HIGH (ref 38–126)
Anion gap: 6 (ref 5–15)
BUN: <5 mg/dL — ABNORMAL LOW (ref 6–20)
CO2: 23 mmol/L (ref 22–32)
Calcium: 7.4 mg/dL — ABNORMAL LOW (ref 8.9–10.3)
Chloride: 108 mmol/L (ref 98–111)
Creatinine, Ser: 0.44 mg/dL — ABNORMAL LOW (ref 0.61–1.24)
GFR, Estimated: >60 mL/min (ref >60)
Glucose, Bld: 123 mg/dL — ABNORMAL HIGH (ref 70–99)
Potassium: 3.5 mmol/L (ref 3.5–5.1)
Sodium: 137 mmol/L (ref 135–145)
Total Bilirubin: 2 mg/dL — ABNORMAL HIGH (ref 0.3–1.2)
Total Protein: 6.3 g/dL — ABNORMAL LOW (ref 6.5–8.1)

## 2021-02-27 LAB — CBC WITH DIFFERENTIAL/PLATELET
Abs Immature Granulocytes: 0 10*3/uL (ref 0.00–0.07)
Basophils Absolute: 0 10*3/uL (ref 0.0–0.1)
Basophils Relative: 0 %
Eosinophils Absolute: 0 10*3/uL (ref 0.0–0.5)
Eosinophils Relative: 2 %
HCT: 30.6 % — ABNORMAL LOW (ref 39.0–52.0)
Hemoglobin: 9.4 g/dL — ABNORMAL LOW (ref 13.0–17.0)
Immature Granulocytes: 0 %
Lymphocytes Relative: 52 %
Lymphs Abs: 0.8 10*3/uL (ref 0.7–4.0)
MCH: 26 pg (ref 26.0–34.0)
MCHC: 30.7 g/dL (ref 30.0–36.0)
MCV: 84.8 fL (ref 80.0–100.0)
Monocytes Absolute: 0.1 10*3/uL (ref 0.1–1.0)
Monocytes Relative: 8 %
Neutro Abs: 0.6 10*3/uL — ABNORMAL LOW (ref 1.7–7.7)
Neutrophils Relative %: 38 %
Platelets: 28 10*3/uL — CL (ref 150–400)
RBC: 3.61 MIL/uL — ABNORMAL LOW (ref 4.22–5.81)
RDW: 15.1 % (ref 11.5–15.5)
WBC: 1.6 10*3/uL — ABNORMAL LOW (ref 4.0–10.5)
nRBC: 0 % (ref 0.0–0.2)

## 2021-02-27 LAB — LIPASE, BLOOD: Lipase: 38 U/L (ref 11–51)

## 2021-02-27 MED ORDER — LIDOCAINE VISCOUS HCL 2 % MT SOLN
15.0000 mL | Freq: Once | OROMUCOSAL | Status: AC
  Administered 2021-02-27: 15 mL via ORAL
  Filled 2021-02-27: qty 15

## 2021-02-27 MED ORDER — ALUM & MAG HYDROXIDE-SIMETH 200-200-20 MG/5ML PO SUSP
30.0000 mL | Freq: Once | ORAL | Status: AC
  Administered 2021-02-27: 30 mL via ORAL
  Filled 2021-02-27: qty 30

## 2021-02-27 MED ORDER — FAMOTIDINE 20 MG PO TABS: 20.0000 mg | ORAL_TABLET | Freq: Two times a day (BID) | ORAL | 0 refills | Status: DC

## 2021-02-27 NOTE — ED Provider Triage Note (Signed)
Emergency Medicine Provider Triage Evaluation Note  [name] , a 41 y.o. male  was evaluated in triage.  Pt complains of abdominal pain in his epigastrium which began this afternoon. It has remained constant. No medications taken PTA. Reports normal BM earlier today without associated N/V, fevers. Reports drinking 2 beers today. No hx of abdominal surgeries.  Review of Systems  Positive: As above Negative: As above  Physical Exam  BP 124/72 (BP Location: Right Arm)    Pulse 83    Temp 98.4 F (36.9 C) (Oral)    Resp 16    SpO2 100%  Gen:   Awake, no distress   Resp:  Normal effort  MSK:   Moves extremities without difficulty  Other:  Epigastric and LUQ TTP. Abdomen is soft, nondistended. No peritoneal signs.  Medical Decision Making  Medically screening exam initiated at 12:19 AM.  Appropriate orders placed.  Mathew [name] was informed that the remainder of the evaluation will be completed by another provider, this initial triage assessment does not replace that evaluation, and the importance of remaining in the ED until their evaluation is complete.  Upper abdominal pain - chart reviewed including prior CT and RUQ ultrasound from 3 days ago which were negative for acute process. Labs initiated for trending. Will give GI cocktail. No signs of acute abdomen when assessed in triage.   Antonietta Breach, PA-C 02/27/21 0022

## 2021-02-27 NOTE — ED Provider Notes (Signed)
[facility] EMERGENCY DEPARTMENT Provider Note   CSN: [id number] Arrival date & time: 02/26/21  2340     History  Chief Complaint  Patient presents with   Abdominal Pain    Mathew [name] is a 41 y.o. male. The patient complains of epigastric pain that began earlier in the afternoon. The pain has remained constant since upset. He has not taken any medications. The patient had a BM earlier in the day. He denies nausea, vomiting, fevers, urinary symptoms. The patient states he drank two beers earlier in the day. He was seen in the emergency department on 02/23/21 for RUQ abdominal pain. During that visit he endorsed heavy drinking over the past few days. The patient had RUQ ultrasound, CT renal study, and chest x-ray at that visit. Findings included cirrhotic liver, small to moderate volume ascites, cholelithiasis. These findings were consistent with CT abdomen pelvis in November. PMH significant for chronic thrombocytopenia, ETOH abuse, microcytic anemia, alcoholic cirrhosis of liver with ascites, umbilical hernia without obstruction, edema due to hypoalbuminemia, Vitamin D deficiency, tobacco use.    HPI     Home Medications Prior to Admission medications   Medication Sig Start Date End Date Taking? Authorizing Provider  famotidine (PEPCID) 20 MG tablet Take 1 tablet (20 mg total) by mouth 2 (two) times daily. 02/27/21  Yes Cherlynn June B, PA  folic acid (FOLVITE) 1 MG tablet Take 1 tablet (1 mg total) by mouth daily. 11/26/20 11/26/21  [name], MD  furosemide (LASIX) 40 MG tablet Take 1 tablet (40 mg total) by mouth as needed for edema. 11/26/20   [name], MD  methocarbamol (ROBAXIN) 500 MG tablet Take 1 tablet (500 mg total) by mouth every 8 (eight) hours as needed for muscle spasms. 12/10/20   Argentina Donovan, PA-C  predniSONE (DELTASONE) 10 MG tablet Take 6 tablets by mouth on day 1, 5 tablets on day 2, 4 tablets on day 3, 3 tablets on day  4, 2 tablets on day 5, 1 tablet on day 6. Take with food. 12/10/20   Argentina Donovan, PA-C  spironolactone (ALDACTONE) 100 MG tablet Take 1 tablet (100 mg total) by mouth daily. 11/26/20   [name], MD  thiamine 100 MG tablet Take 1 tablet (100 mg total) by mouth daily. 11/26/20   [name], MD  Vitamin D, Ergocalciferol, (DRISDOL) 1.25 MG (50000 UNIT) CAPS capsule Take 1 capsule (50,000 Units total) by mouth every 7 (seven) days. Tuesdays 11/26/20   [name], MD  ferrous sulfate (FERROUSUL) 325 (65 FE) MG tablet Take 1 tablet (325 mg total) by mouth 2 (two) times daily with a meal. Patient not taking: Reported on 04/03/2020 02/03/20 04/03/20  [name], MD  pantoprazole (PROTONIX) 40 MG tablet Take 1 tablet (40 mg total) by mouth daily. Patient not taking: Reported on 04/03/2020 03/31/20 04/03/20  Charlott Rakes, MD      Allergies    Patient has no known allergies.    Review of Systems   Review of Systems  Constitutional:  Negative for diaphoresis and fever.  Respiratory:  Negative for shortness of breath.   Cardiovascular:  Negative for chest pain.  Gastrointestinal:  Positive for abdominal pain. Negative for abdominal distention, constipation, diarrhea, nausea and vomiting.  Genitourinary:  Negative for difficulty urinating, dysuria and flank pain.  Skin:  Negative for color change.   Physical Exam Updated Vital Signs BP 138/86    Pulse 75  Temp 98.4 F (36.9 C) (Oral)    Resp 11    SpO2 100%  Physical Exam Constitutional:      General: He is not in acute distress.    Appearance: He is normal weight.  HENT:     Head: Normocephalic and atraumatic.  Eyes:     Conjunctiva/sclera: Conjunctivae normal.  Cardiovascular:     Rate and Rhythm: Normal rate and regular rhythm.     Pulses: Normal pulses.     Heart sounds: Normal heart sounds.  Pulmonary:     Effort: Pulmonary effort is normal. No respiratory distress.     Breath sounds: Normal breath  sounds.  Abdominal:     General: Bowel sounds are normal. There is no distension.     Palpations: Abdomen is soft. There is no fluid wave.     Tenderness: There is abdominal tenderness in the right upper quadrant, epigastric area and left upper quadrant. There is no right CVA tenderness, left CVA tenderness, guarding or rebound. Negative signs include Murphy's sign.     Hernia: A hernia is present. Hernia is present in the umbilical area.  Musculoskeletal:     Cervical back: Normal range of motion.  Skin:    General: Skin is warm and dry.     Coloration: Skin is not jaundiced.  Neurological:     Mental Status: He is alert.    ED Results / Procedures / Treatments   Labs (all labs ordered are listed, but only abnormal results are displayed) Labs Reviewed  CBC WITH DIFFERENTIAL/PLATELET - Abnormal; Notable for the following components:      Result Value   WBC 1.6 (*)    RBC 3.61 (*)    Hemoglobin 9.4 (*)    HCT 30.6 (*)    Platelets 28 (*)    Neutro Abs 0.6 (*)    All other components within normal limits  COMPREHENSIVE METABOLIC PANEL - Abnormal; Notable for the following components:   Glucose, Bld 123 (*)    BUN <5 (*)    Creatinine, Ser 0.44 (*)    Calcium 7.4 (*)    Total Protein 6.3 (*)    Albumin 2.3 (*)    AST 50 (*)    Alkaline Phosphatase 151 (*)    Total Bilirubin 2.0 (*)    All other components within normal limits  LIPASE, BLOOD  PATHOLOGIST SMEAR REVIEW    EKG EKG Interpretation  Date/Time:  Friday February 26 2021 23:48:45 EST Ventricular Rate:  79 PR Interval:  152 QRS Duration: 96 QT Interval:  416 QTC Calculation: 477 R Axis:   -3 Text Interpretation: Normal sinus rhythm Cannot rule out Anterior infarct , age undetermined Abnormal ECG No acute changes When compared with ECG of 23-Feb-2021 10:46, PREVIOUS ECG IS PRESENT Confirmed by Addison Lank [phone]) on 02/27/2021 2:14:53 AM  Radiology No results found.  Procedures Procedures     Medications Ordered in ED Medications  alum & mag hydroxide-simeth (MAALOX/MYLANTA) 200-200-20 MG/5ML suspension 30 mL (30 mLs Oral Given 02/27/21 0213)    And  lidocaine (XYLOCAINE) 2 % viscous mouth solution 15 mL (15 mLs Oral Given 02/27/21 0213)    ED Course/ Medical Decision Making/ A&P                           Medical Decision Making  This patient presents to the ED for concern of upper abdominal pain, this involves an extensive number of treatment options, and  is a complaint that carries with it a high risk of complications and morbidity.  The differential diagnosis includes but is not limited to hepatitis, GERD, gastritis, pyelonephritis, pneumonia, cholecystitis, cholelithiasis, pancreatitis, ACS, dissection, and others   Co morbidities that complicate the patient evaluation  Thrombocytopenia, microcytic anemia, alcohol abuse, cirrhotic liver   Additional history obtained:  External records from outside source obtained and reviewed including ED notes from 02/23/2021   Lab Tests:  I Ordered, and personally interpreted labs.  The pertinent results include: Alk phos 151, platelets 28, WBC 1.6, RBC 3.61, hemoglobin 9.4, HCT 30.6   Cardiac Monitoring:  The patient was maintained on a cardiac monitor.  I personally viewed and interpreted the cardiac monitored which showed an underlying rhythm of: Normal sinus rhythm   Medicines ordered and prescription drug management:  I ordered medication including GI cocktail for reflux symptoms Reevaluation of the patient after these medicines showed that the patient improved I have reviewed the patients home medicines and have made adjustments as needed   Test Considered:  CT abdomen   Reevaluation:  After the interventions noted above, I reevaluated the patient and found that they have :improved   Social Determinants of Health:  Alcohol use disorder   Dispostion:  After consideration of the diagnostic results  and the patients response to treatment, I feel that the patent would benefit from discharge home. The patient's symptoms improved with a GI cocktail.  The patient had imaging 3 days ago and the CT no benefit of repeat exposure to radiation today.  The patient is not jaundiced.  The patient has no nausea or vomiting.  He states that he is now hungry and would like something to drink.  The imaging on 2/14 showed cholelithiasis with no choledocholithiasis.  I encouraged the patient to stop drinking.  He should follow-up with his PCP to discuss plan and resources to discontinue drinking.  I also strongly encouraged the patient to follow-up with hematology.  The patient has pancytopenia.  This has been an ongoing issue for the past year.  Values today are in line with previous values.  He has a relationship with oncology. An office note shows that the hematologist tried to call him on 2/15 but voicemail was full. I see no indication for hospital admission at this time. I believe he can safely discharge home with PCP and hematology follow up. Return precautions provided.   Final Clinical Impression(s) / ED Diagnoses Final diagnoses:  Pain of upper abdomen  Pancytopenia ([place])    Rx / DC Orders ED Discharge Orders          Ordered    famotidine (PEPCID) 20 MG tablet  2 times daily        02/27/21 0316              Dorothyann Peng, PA 02/27/21 0347    Fatima Blank, MD 02/28/21 [phone]

## 2021-02-27 NOTE — Discharge Instructions (Addendum)
You were seen today for abdominal pain. You were given a GI cocktail which improved your symptoms. I have provided a prescription for Pepcid which is to be taken twice daily. You should follow up with your primary care provider for chronic abdominal pain. I also recommend discussing a plan to discontinue alcohol use with your PCP. You have lab work that shows a condition called pancytopenia. This is consistent with lab work that has been taken at different times over the past year. It is important that you follow up with your hematologist. I have provided the contact information. The hematology office attempted to contact you on 2/15 but your voicemail was full. Call them to schedule a follow up appointment. Return to the emergency department if you develop life threatening conditions such as shortness of breath, chest pain, or altered level of consciousness

## 2021-03-02 LAB — PATHOLOGIST SMEAR REVIEW

## 2021-03-07 ENCOUNTER — Other Ambulatory Visit: Payer: Self-pay | Admitting: Hematology and Oncology

## 2021-03-07 DIAGNOSIS — K7030 Alcoholic cirrhosis of liver without ascites: Secondary | ICD-10-CM

## 2021-03-08 ENCOUNTER — Other Ambulatory Visit: Payer: Self-pay

## 2021-03-08 ENCOUNTER — Inpatient Hospital Stay: Payer: Self-pay | Attending: Hematology and Oncology | Admitting: Hematology and Oncology

## 2021-03-08 ENCOUNTER — Inpatient Hospital Stay: Payer: Self-pay

## 2021-03-08 VITALS — BP 143/81 | HR 93 | Temp 97.9°F | Resp 17 | Wt 165.7 lb

## 2021-03-08 DIAGNOSIS — F17210 Nicotine dependence, cigarettes, uncomplicated: Secondary | ICD-10-CM | POA: Insufficient documentation

## 2021-03-08 DIAGNOSIS — K7460 Unspecified cirrhosis of liver: Secondary | ICD-10-CM | POA: Insufficient documentation

## 2021-03-08 DIAGNOSIS — D6959 Other secondary thrombocytopenia: Secondary | ICD-10-CM | POA: Insufficient documentation

## 2021-03-08 DIAGNOSIS — D696 Thrombocytopenia, unspecified: Secondary | ICD-10-CM

## 2021-03-08 DIAGNOSIS — K7030 Alcoholic cirrhosis of liver without ascites: Secondary | ICD-10-CM

## 2021-03-08 DIAGNOSIS — D649 Anemia, unspecified: Secondary | ICD-10-CM | POA: Insufficient documentation

## 2021-03-08 DIAGNOSIS — F1010 Alcohol abuse, uncomplicated: Secondary | ICD-10-CM | POA: Insufficient documentation

## 2021-03-08 LAB — CBC WITH DIFFERENTIAL (CANCER CENTER ONLY)
Abs Immature Granulocytes: 0 10*3/uL (ref 0.00–0.07)
Basophils Absolute: 0 10*3/uL (ref 0.0–0.1)
Basophils Relative: 1 %
Eosinophils Absolute: 0 10*3/uL (ref 0.0–0.5)
Eosinophils Relative: 1 %
HCT: 33.6 % — ABNORMAL LOW (ref 39.0–52.0)
Hemoglobin: 10.5 g/dL — ABNORMAL LOW (ref 13.0–17.0)
Immature Granulocytes: 0 %
Lymphocytes Relative: 50 %
Lymphs Abs: 1.1 10*3/uL (ref 0.7–4.0)
MCH: 26.3 pg (ref 26.0–34.0)
MCHC: 31.3 g/dL (ref 30.0–36.0)
MCV: 84.2 fL (ref 80.0–100.0)
Monocytes Absolute: 0.2 10*3/uL (ref 0.1–1.0)
Monocytes Relative: 8 %
Neutro Abs: 0.8 10*3/uL — ABNORMAL LOW (ref 1.7–7.7)
Neutrophils Relative %: 40 %
Platelet Count: 36 10*3/uL — ABNORMAL LOW (ref 150–400)
RBC: 3.99 MIL/uL — ABNORMAL LOW (ref 4.22–5.81)
RDW: 18.9 % — ABNORMAL HIGH (ref 11.5–15.5)
WBC Count: 2.1 10*3/uL — ABNORMAL LOW (ref 4.0–10.5)
nRBC: 0 % (ref 0.0–0.2)

## 2021-03-08 LAB — IRON AND IRON BINDING CAPACITY (CC-WL,HP ONLY)
Iron: 26 ug/dL — ABNORMAL LOW (ref 45–182)
Saturation Ratios: 10 % — ABNORMAL LOW (ref 17.9–39.5)
TIBC: 272 ug/dL (ref 250–450)
UIBC: 246 ug/dL (ref 117–376)

## 2021-03-08 LAB — CMP (CANCER CENTER ONLY)
ALT: 19 U/L (ref 0–44)
AST: 50 U/L — ABNORMAL HIGH (ref 15–41)
Albumin: 3 g/dL — ABNORMAL LOW (ref 3.5–5.0)
Alkaline Phosphatase: 121 U/L (ref 38–126)
Anion gap: 7 (ref 5–15)
BUN: <5 mg/dL — ABNORMAL LOW (ref 6–20)
CO2: 24 mmol/L (ref 22–32)
Calcium: 7.7 mg/dL — ABNORMAL LOW (ref 8.9–10.3)
Chloride: 111 mmol/L (ref 98–111)
Creatinine: 0.47 mg/dL — ABNORMAL LOW (ref 0.61–1.24)
GFR, Estimated: >60 mL/min (ref >60)
Glucose, Bld: 123 mg/dL — ABNORMAL HIGH (ref 70–99)
Potassium: 3.4 mmol/L — ABNORMAL LOW (ref 3.5–5.1)
Sodium: 142 mmol/L (ref 135–145)
Total Bilirubin: 2.6 mg/dL — ABNORMAL HIGH (ref 0.3–1.2)
Total Protein: 7 g/dL (ref 6.5–8.1)

## 2021-03-08 LAB — RETIC PANEL
Immature Retic Fract: 17.3 % — ABNORMAL HIGH (ref 2.3–15.9)
RBC.: 3.95 MIL/uL — ABNORMAL LOW (ref 4.22–5.81)
Retic Count, Absolute: 89.7 10*3/uL (ref 19.0–186.0)
Retic Ct Pct: 2.3 % (ref 0.4–3.1)
Reticulocyte Hemoglobin: 29.4 pg (ref >27.9)

## 2021-03-08 LAB — VITAMIN B12: Vitamin B-12: 872 pg/mL (ref 180–914)

## 2021-03-08 LAB — FOLATE: Folate: 10.8 ng/mL (ref >5.9)

## 2021-03-08 NOTE — Progress Notes (Signed)
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 Lorenso Courier  03/08/2021: WBC 2.1, Hgb 10.5, MCV 84.2, Plt 36  HISTORY OF PRESENTING ILLNESS:  [place] 41 y.o. male with medical history significant for ETOH abuse/cirrhosis who presents for a follow up visit for thrombocytopenia.   On exam today Mrs. [name]'s communication is aided by a hospital improved interpreter via iPad.  He notes he has been feeling weak lately.  He has been taking a multivitamin for energy and it did help but he ran out of this.  He notes he was taking Centrum.  He notes that he went to the hospital several days ago due to abdominal pain and discomfort in the pit of his stomach.  He notes that he has a hernia and that he was started on Pepcid which helped with his discomfort.  He notes he is prone to bruising but does not have any bleeding.  He denies any blood or dark-colored bowel movements.  He notes that he occasionally does feel nauseated but does not vomit.  He notes he is not currently following with a liver doctor.  He notes that he does continue to drink alcohol and typically drinks about 2 beers per day.  He notes that beer is his drink of choice.  He currently denies any fevers, chills, sweats, vomiting or diarrhea.  A full 10 point ROS is listed below.  MEDICAL HISTORY:  Past Medical History:  Diagnosis Date   Alcohol withdrawal seizure ([place])    Cirrhosis ([place])    ETOH abuse    Pneumonia 04/02/2020     SURGICAL HISTORY: Past Surgical History:  Procedure Laterality Date   COLONOSCOPY WITH PROPOFOL N/A 03/17/2018   Procedure: COLONOSCOPY WITH PROPOFOL;  Surgeon: Ronald Lobo, MD;  Location: [facility] ENDOSCOPY;  Service: Endoscopy;  Laterality: N/A;   ESOPHAGOGASTRODUODENOSCOPY (EGD) WITH PROPOFOL N/A 03/17/2018   Procedure: ESOPHAGOGASTRODUODENOSCOPY (EGD) WITH PROPOFOL;  Surgeon: Ronald Lobo, MD;  Location: [place];  Service: Endoscopy;  Laterality: N/A;   IR PARACENTESIS  03/14/2018   IR PARACENTESIS  11/18/2020    SOCIAL HISTORY: Social History   Socioeconomic History   Marital status: Significant Other    Spouse name: Not on file   Number of children: Not on file   Years of education: Not on file   Highest education level: Not on file  Occupational History   Not on file  Tobacco Use   Smoking status: Every Day    Packs/day: 0.25    Types: Cigarettes    Start date: 08/11/2019   Smokeless tobacco: Never  Vaping Use   Vaping Use: Never used  Substance and Sexual Activity   Alcohol use: Yes   Drug use: No   Sexual activity: Not Currently  Other Topics Concern   Not on file  Social History Narrative   Not on file   Social Determinants of Health   Financial Resource Strain: Not on file  Food Insecurity: Not on file  Transportation Needs: Not on file  Physical Activity: Not on file  Stress:  Not on file  Social Connections: Not on file  Intimate Partner Violence: Not on file    FAMILY HISTORY: Family History  Problem Relation Age of Onset   Cancer Neg Hx    Heart disease Neg Hx     ALLERGIES:  has No Known Allergies.  MEDICATIONS:  Current Outpatient Medications  Medication Sig Dispense Refill   famotidine (PEPCID) 20 MG tablet Take 1 tablet (20 mg total) by mouth 2 (two) times daily. 30 tablet 0   folic acid (FOLVITE) 1 MG tablet Take 1 tablet (1 mg total) by mouth daily. 60 tablet 1   furosemide (LASIX) 40 MG tablet Take 1 tablet (40 mg total) by mouth  as needed for edema. 40 tablet 1   methocarbamol (ROBAXIN) 500 MG tablet Take 1 tablet (500 mg total) by mouth every 8 (eight) hours as needed for muscle spasms. 90 tablet 0   predniSONE (DELTASONE) 10 MG tablet Take 6 tablets by mouth on day 1, 5 tablets on day 2, 4 tablets on day 3, 3 tablets on day 4, 2 tablets on day 5, 1 tablet on day 6. Take with food. 21 tablet 0   spironolactone (ALDACTONE) 100 MG tablet Take 1 tablet (100 mg total) by mouth daily. 60 tablet 2   thiamine 100 MG tablet Take 1 tablet (100 mg total) by mouth daily. 60 tablet 2   Vitamin D, Ergocalciferol, (DRISDOL) 1.25 MG (50000 UNIT) CAPS capsule Take 1 capsule (50,000 Units total) by mouth every 7 (seven) days. Tuesdays 14 capsule 1   No current facility-administered medications for this visit.    REVIEW OF SYSTEMS:   Constitutional: ( - ) fevers, ( - )  chills , ( - ) night sweats Eyes: ( - ) blurriness of vision, ( - ) double vision, ( - ) watery eyes Ears, nose, mouth, throat, and face: ( - ) mucositis, ( - ) sore throat Respiratory: ( - ) cough, ( - ) dyspnea, ( - ) wheezes Cardiovascular: ( - ) palpitation, ( - ) chest discomfort, ( - ) lower extremity swelling Gastrointestinal:  ( - ) nausea, ( - ) heartburn, ( - ) change in bowel habits Skin: ( - ) abnormal skin rashes Lymphatics: ( - ) new lymphadenopathy, ( - ) easy bruising Neurological: ( - ) numbness, ( - ) tingling, ( - ) new weaknesses Behavioral/Psych: ( - ) mood change, ( - ) new changes  All other systems were reviewed with the patient and are negative.  PHYSICAL EXAMINATION:  Vitals:   03/08/21 1501  BP: (!) 143/81  Pulse: 93  Resp: 17  Temp: 97.9 F (36.6 C)  SpO2: 100%    Filed Weights   03/08/21 1501  Weight: 165 lb 11.2 oz (75.2 kg)     GENERAL: chronically ill appearing middle aged Hispanic male in NAD  SKIN: skin color, texture, turgor are normal, no rashes or significant lesions EYES: conjunctiva are pink and non-injected,  sclera clear LUNGS: clear to auscultation and percussion with normal breathing effort HEART: regular rate & rhythm and no murmurs and no lower extremity edema ABDOMEN: soft, non-tender, non-distended, normal bowel sounds. No frank ascites.  Musculoskeletal: no cyanosis of digits and no clubbing  PSYCH: alert & oriented x 3, fluent speech NEURO: no focal motor/sensory deficits  LABORATORY DATA:  I have reviewed the data as listed CBC Latest Ref Rng & Units 03/08/2021 02/27/2021 02/23/2021  WBC 4.0 - 10.5 K/uL 2.1(L) 1.6(L) 2.6(L)  Hemoglobin 13.0 -  17.0 g/dL 10.5(L) 9.4(L) 11.1(L)  Hematocrit 39.0 - 52.0 % 33.6(L) 30.6(L) 35.9(L)  Platelets 150 - 400 K/uL 36(L) 28(LL) 37(L)    CMP Latest Ref Rng & Units 03/08/2021 02/27/2021 02/23/2021  Glucose 70 - 99 mg/dL 123(H) 123(H) 154(H)  BUN 6 - 20 mg/dL <5(L) <5(L) 5(L)  Creatinine 0.61 - 1.24 mg/dL 0.47(L) 0.44(L) 0.57(L)  Sodium 135 - 145 mmol/L 142 137 138  Potassium 3.5 - 5.1 mmol/L 3.4(L) 3.5 3.8  Chloride 98 - 111 mmol/L 111 108 104  CO2 22 - 32 mmol/L 24 23 25   Calcium 8.9 - 10.3 mg/dL 7.7(L) 7.4(L) 8.0(L)  Total Protein 6.5 - 8.1 g/dL 7.0 6.3(L) 7.0  Total Bilirubin 0.3 - 1.2 mg/dL 2.6(H) 2.0(H) 2.5(H)  Alkaline Phos 38 - 126 U/L 121 151(H) 129(H)  AST 15 - 41 U/L 50(H) 50(H) 73(H)  ALT 0 - 44 U/L 19 23 27     RADIOGRAPHIC STUDIES: DG Chest 2 View  Result Date: 02/23/2021 CLINICAL DATA:  Short of breath EXAM: CHEST - 2 VIEW COMPARISON:  04/02/2020 FINDINGS: The heart size and mediastinal contours are within normal limits. Both lungs are clear. The visualized skeletal structures are unremarkable. IMPRESSION: No active cardiopulmonary disease. Electronically Signed   By: Franchot Gallo M.D.   On: 02/23/2021 11:17   CT Renal Stone Study  Result Date: 02/23/2021 CLINICAL DATA:  Flank pain.  Pain in the right hip area. EXAM: CT ABDOMEN AND PELVIS WITHOUT CONTRAST TECHNIQUE: Multidetector CT imaging of the abdomen and pelvis was performed  following the standard protocol without IV contrast. RADIATION DOSE REDUCTION: This exam was performed according to the departmental dose-optimization program which includes automated exposure control, adjustment of the mA and/or kV according to patient size and/or use of iterative reconstruction technique. COMPARISON:  11/18/2020 FINDINGS: Lower chest: Unremarkable. Hepatobiliary: Nodular hepatic contour is compatible with cirrhosis. Gallbladder is distended with small calcified gallstones evident. No intrahepatic or extrahepatic biliary dilation. Pancreas: No focal mass lesion. No dilatation of the main duct. No intraparenchymal cyst. No peripancreatic edema. Spleen: No splenomegaly. No focal mass lesion. Adrenals/Urinary Tract: No adrenal nodule or mass. Kidneys unremarkable. Insert normal ureter bladder is distended. Stomach/Bowel: Stomach is unremarkable. No gastric wall thickening. No evidence of outlet obstruction. Duodenum is normally positioned as is the ligament of Treitz. There is some apparent circumferential wall thickening in the mid duodenum. No small bowel wall thickening. No small bowel dilatation. The terminal ileum is normal. The appendix is normal diameter. High attenuation material in the lumen of the appendix may be related to appendicoliths or retained contrast. Colon is nondistended which may contribute to the appearance of wall thickening in the right colon. Vascular/Lymphatic: No abdominal aortic aneurysm. No abdominal aortic atherosclerotic calcification. There is no gastrohepatic or hepatoduodenal ligament lymphadenopathy. No retroperitoneal or mesenteric lymphadenopathy. No pelvic sidewall lymphadenopathy. Reproductive: The prostate gland and seminal vesicles are unremarkable. Other: Small to moderate volume ascites is seen in the abdomen and pelvis. Similar appearance of haziness in the central small bowel mesentery, potentially related to congestion/edema. Musculoskeletal: No worrisome  lytic or sclerotic osseous abnormality. IMPRESSION: 1. Nodular hepatic contour compatible with cirrhosis. 2. Small to moderate volume ascites. 3. Cholelithiasis with distended gallbladder. 4. Apparent circumferential wall thickening in the mid duodenum. This is nonspecific and may be related to a component of duodenitis. 5. No evidence for urinary stone disease. 6. Colon is nondistended which may contribute to the appearance of wall thickening in the right colon. Infectious/inflammatory right-sided colitis is not excluded.  7. Similar appearance of haziness in the central small bowel mesentery, potentially related to congestion/edema. Electronically Signed   By: Misty Stanley M.D.   On: 02/23/2021 13:57   US Abdomen Limited RUQ (LIVER/GB)  Result Date: 02/23/2021 CLINICAL DATA:  Cholelithiasis EXAM: ULTRASOUND ABDOMEN LIMITED RIGHT UPPER QUADRANT COMPARISON:  Same day CT. FINDINGS: Gallbladder: There is gallbladder wall thickening up to 5 mm likely related to hepatic cirrhosis. There is a small amount of echogenic sludge/tiny stones. The gallbladder is non-dilated. Common bile duct: Diameter: 6 mm Liver: Coarse liver echotexture with nodular contours. Portal vein is patent on color Doppler imaging with normal direction of blood flow towards the liver. Other: None. IMPRESSION: Cirrhotic liver morphology.  Small to moderate volume ascites. Electronically Signed   By: Maurine Simmering M.D.   On: 02/23/2021 15:49     ASSESSMENT & PLAN [place] 41 y.o. male with medical history significant for ETOH abuse/cirrhosis who presents for a follow up visit for his thrombocytopenia.   After review the labs, view the records, discussed with the patient the findings most consistent with thrombocytopenia and anemia in the setting of alcohol abuse/cirrhosis.  The patient has a known history of cirrhotic disease and unfortunately does not follow regularly with a gastroenterologist.  We have offered to connect him back  to Dr. Cristina Gong who apparently saw him back in 2020 based on prior records.  In order to assure that there are no other causes for the patient's findings we will order iron studies, vitamin B12, folate, and review the peripheral blood film.  Overall his findings are most consistent with chronic illness from his liver disease.  There is no need for routine follow-up in our clinic.  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 --In the event the patient were to have bleeding issues or were to require a surgical intervention of some kind we could consider administering a TPO mimetic to the patient in order to bolster his platelet counts.  This would be on an as-needed basis. --Prior viral serologies of hepatitis B, hepatitis C, and HIV were all negative in the last 4 years. --Encouraged continued follow-up with his hepatologist. --Labs today show white blood cell count 2.1, hemoglobin 10.5, and platelets of 36 --Return to clinic on an as-needed basis  # Anemia, normocytic --Likely due to anemia of chronic disease with his alcoholic cirrhosis. --We will evaluate nutritional studies today in order to assure that there is no intervention required for his anemia. --Return to clinic on an as-needed basis  No orders of the defined types were placed in this encounter.   All questions were answered. The patient knows to call the clinic with any problems, questions or concerns.  A total of more than 60 minutes were spent on this encounter and over half of that time was spent on counseling and coordination of care as outlined above.   Ledell Peoples, MD Department of Hematology/Oncology [place] at [facility] Phone: [phone] Pager: [phone] Email: [email or web address]  03/08/2021 5:27 PM

## 2021-03-09 ENCOUNTER — Other Ambulatory Visit: Payer: Self-pay | Admitting: *Deleted

## 2021-03-09 LAB — FERRITIN: Ferritin: 11 ng/mL — ABNORMAL LOW (ref 24–336)

## 2021-03-09 MED ORDER — FERROUS SULFATE 325 (65 FE) MG PO TBEC: 325.0000 mg | DELAYED_RELEASE_TABLET | Freq: Three times a day (TID) | ORAL | 3 refills | Status: DC

## 2021-03-09 NOTE — Telephone Encounter (Signed)
Called brother via interpreter to give message below

## 2021-04-07 ENCOUNTER — Other Ambulatory Visit: Payer: Self-pay | Admitting: Physician Assistant

## 2021-04-07 ENCOUNTER — Other Ambulatory Visit: Payer: Self-pay

## 2021-04-07 DIAGNOSIS — M5432 Sciatica, left side: Secondary | ICD-10-CM

## 2021-04-07 NOTE — Telephone Encounter (Signed)
I have not seen patient recently cannot refill ?

## 2021-04-09 ENCOUNTER — Other Ambulatory Visit: Payer: Self-pay

## 2021-05-04 ENCOUNTER — Other Ambulatory Visit: Payer: Self-pay

## 2021-05-31 ENCOUNTER — Ambulatory Visit: Payer: Self-pay | Admitting: Gastroenterology

## 2021-06-08 NOTE — Progress Notes (Signed)
This encounter was created in error - please disregard.

## 2021-06-10 ENCOUNTER — Ambulatory Visit: Payer: Self-pay

## 2021-06-10 NOTE — Telephone Encounter (Signed)
   Chief Complaint: Abdominal pain, below belly button has a bulge that is golf ball size. Symptoms: Pain Frequency: 3 weeks ago Pertinent Negatives: Patient denies fever Disposition: [] ED /[] Urgent Care (no appt availability in office) / [] Appointment(In office/virtual)/ []  [facility]/ [] Home Care/ [] Refused Recommended Disposition /[] [place] [facility]/ [x]  Follow-up with PCP Additional Notes: Instructed to go to ED for worsening of symptoms. Pt. Asking to be worked in. Please advise.  Answer Assessment - Initial Assessment Questions 1. ONSET:  "When did this first appear?"     3 weeks ago 2. APPEARANCE: "What does it look like?"     Bulge 3. SIZE: "How big is it?" (inches, cm or compare to coins, fruit)     Golf ball 4. LOCATION: "Where exactly is the hernia located?"     Under belly button 5. PATTERN: "Does the swelling come and go, or has it been constant since it started?"     Constant at night 6. PAIN: "Is there any pain?" If Yes, ask: "How bad is it?"  (Scale 1-10; or mild, moderate, severe)     8-9 7. DIAGNOSIS: "Have you been seen by a doctor (or NP/PA) for this?" "Did the doctor diagnose you as having a hernia?"     Yes 8. OTHER SYMPTOMS: "Do you have any other symptoms?" (e.g., fever, abdominal pain, vomiting)     Abdominal pain 9. PREGNANCY: "Is there any chance you are pregnant?" "When was your last menstrual period?"     N/a  Protocols used: Hernia-A-AH

## 2021-06-11 NOTE — Telephone Encounter (Signed)
This is a chronic condition, he has an umbilical hernia ,  can be worked in to my schedule next week, if worse over weekend go to ED.  Has alcohol use, remind patient to avoid alcohol

## 2021-06-14 NOTE — Telephone Encounter (Signed)
Called pt, I will call back after lunch to see if he has a ride  Interpreter [phone]

## 2021-06-14 NOTE — Telephone Encounter (Signed)
Called pt and Appt made  Interpreter [phone]

## 2021-06-15 ENCOUNTER — Ambulatory Visit: Payer: Self-pay | Attending: Critical Care Medicine | Admitting: Critical Care Medicine

## 2021-06-15 ENCOUNTER — Other Ambulatory Visit: Payer: Self-pay

## 2021-06-15 ENCOUNTER — Encounter: Payer: Self-pay | Admitting: Critical Care Medicine

## 2021-06-15 VITALS — BP 137/83 | HR 98 | Wt 188.8 lb

## 2021-06-15 DIAGNOSIS — D696 Thrombocytopenia, unspecified: Secondary | ICD-10-CM

## 2021-06-15 DIAGNOSIS — E559 Vitamin D deficiency, unspecified: Secondary | ICD-10-CM

## 2021-06-15 DIAGNOSIS — D509 Iron deficiency anemia, unspecified: Secondary | ICD-10-CM

## 2021-06-15 DIAGNOSIS — R109 Unspecified abdominal pain: Secondary | ICD-10-CM

## 2021-06-15 DIAGNOSIS — E8809 Other disorders of plasma-protein metabolism, not elsewhere classified: Secondary | ICD-10-CM

## 2021-06-15 DIAGNOSIS — K7030 Alcoholic cirrhosis of liver without ascites: Secondary | ICD-10-CM

## 2021-06-15 DIAGNOSIS — K7031 Alcoholic cirrhosis of liver with ascites: Secondary | ICD-10-CM

## 2021-06-15 DIAGNOSIS — K429 Umbilical hernia without obstruction or gangrene: Secondary | ICD-10-CM

## 2021-06-15 DIAGNOSIS — F1010 Alcohol abuse, uncomplicated: Secondary | ICD-10-CM

## 2021-06-15 MED ORDER — FAMOTIDINE 20 MG PO TABS
20.0000 mg | ORAL_TABLET | Freq: Two times a day (BID) | ORAL | 0 refills | Status: DC
  Filled 2021-06-15: qty 30, 15d supply, fill #0

## 2021-06-15 MED ORDER — FERROUS SULFATE 325 (65 FE) MG PO TABS
325.0000 mg | ORAL_TABLET | Freq: Three times a day (TID) | ORAL | 3 refills | Status: DC
  Filled 2021-06-15: qty 30, 10d supply, fill #0

## 2021-06-15 MED ORDER — VITAMIN D (ERGOCALCIFEROL) 1.25 MG (50000 UNIT) PO CAPS
50000.0000 [IU] | ORAL_CAPSULE | ORAL | 1 refills | Status: DC
  Filled 2021-06-15: qty 12, 84d supply, fill #0

## 2021-06-15 MED ORDER — FOLIC ACID 1 MG PO TABS
1.0000 mg | ORAL_TABLET | Freq: Every day | ORAL | 1 refills | Status: DC
  Filled 2021-06-15: qty 30, 30d supply, fill #0

## 2021-06-15 MED ORDER — THIAMINE HCL 100 MG PO TABS
100.0000 mg | ORAL_TABLET | Freq: Every day | ORAL | 2 refills | Status: DC
  Filled 2021-06-15: qty 60, 60d supply, fill #0

## 2021-06-15 MED ORDER — SPIRONOLACTONE 100 MG PO TABS
100.0000 mg | ORAL_TABLET | Freq: Two times a day (BID) | ORAL | 4 refills | Status: DC
  Filled 2021-06-15: qty 60, 30d supply, fill #0

## 2021-06-15 MED ORDER — FUROSEMIDE 40 MG PO TABS
40.0000 mg | ORAL_TABLET | Freq: Two times a day (BID) | ORAL | 1 refills | Status: DC
  Filled 2021-06-15: qty 40, 20d supply, fill #0

## 2021-06-15 NOTE — Progress Notes (Deleted)
   Established Patient Office Visit  Subjective   Patient ID: Mathew R[place]ack [name] male    DOB: [birth year]  Age: 41 y.o. MRN: [id number]  No chief complaint on file.   HPI  {History (Optional):23778}  ROS    Objective:     There were no vitals taken for this visit. {Vitals History (Optional):23777}  Physical Exam   No results found for any visits on 06/15/21.  {Labs (Optional):23779}  The ASCVD Risk score (Arnett DK, et al., 2019) failed to calculate for the following reasons:   Cannot find a previous HDL lab   Cannot find a previous total cholesterol lab    Assessment & Plan:   Problem List Items Addressed This Visit   None   No follow-ups on file.    Asencion Noble, MD

## 2021-06-15 NOTE — Assessment & Plan Note (Signed)
Follow-up with Social Work. Counseled Pt on the importance of abstinence from alcohol for his conditions.

## 2021-06-15 NOTE — Assessment & Plan Note (Addendum)
Alcohol induced cirrhosis with ascites worry about potential for spontaneous bacterial peritonitis or intraperitoneal bleeding with recent fall  We will need to check complete set of labs In the interim we will increase diuretic therapy as below May yet require ER referral  Increase Lasix to 40 mg BID & Spironolactone 100 mg BID.

## 2021-06-15 NOTE — Progress Notes (Signed)
Established Patient Office Visit  Subjective   Patient ID: Mathew [name], male    DOB: 05/07/1980  Age: 41 y.o. MRN: [id number]  Chief Complaint  Patient presents with   Hernia    Interpreter: De Nurse #[id number]  Emmanuell Kantz is a 41 y.o. Male with a history of Cirrhosis, thrombocytopenia, and alcohol use disorder presents to the clinic today for worsening umbilical hernia pain. This is a known condition and most likely worsening due to his ascites and liver disease. He now complains of shortness of breath at night when laying down in bed with associated groin and leg edema Patient has been referred to general surgery but due to his current condition is not a candidate for surgical repair.   Patients admits he is not adherent to his medications and continues to drink alcohol.   Last known alcoholic intake was this past Sunday where he admitted to drinking 5-6 beers.       Review of Systems  Constitutional:  Positive for malaise/fatigue.  HENT: Negative.    Eyes: Negative.   Respiratory:  Positive for shortness of breath.   Cardiovascular:  Positive for leg swelling.  Gastrointestinal:  Positive for abdominal pain.  Genitourinary: Negative.   Musculoskeletal: Negative.   Skin: Negative.   Neurological: Negative.   Endo/Heme/Allergies: Negative.   Psychiatric/Behavioral:  Positive for substance abuse. The patient is nervous/anxious.      Objective:     BP 137/83   Pulse 98   Wt 188 lb 12.8 oz (85.6 kg)   SpO2 95%   BMI 35.67 kg/m  BP Readings from Last 3 Encounters:  06/15/21 137/83  03/08/21 (!) 143/81  02/27/21 116/73   Wt Readings from Last 3 Encounters:  06/15/21 188 lb 12.8 oz (85.6 kg)  03/08/21 165 lb 11.2 oz (75.2 kg)  12/10/20 170 lb (77.1 kg)      Physical Exam Constitutional:      Appearance: He is ill-appearing. He is not toxic-appearing.  Eyes:     General: Scleral icterus present.  Cardiovascular:     Rate and Rhythm: Normal rate and  regular rhythm.     Pulses: Normal pulses.     Heart sounds: Normal heart sounds.  Pulmonary:     Effort: Pulmonary effort is normal.     Breath sounds: Examination of the right-middle field reveals decreased breath sounds. Examination of the left-middle field reveals decreased breath sounds. Examination of the right-lower field reveals decreased breath sounds. Examination of the left-lower field reveals decreased breath sounds. Decreased breath sounds present. No wheezing, rhonchi or rales.       Comments: Dullness to percussion 3/4 up bilaterally likely due to pleural effusion  Abdominal:     General: Bowel sounds are decreased. There is distension.     Palpations: There is fluid wave.     Tenderness: There is generalized abdominal tenderness. There is guarding. There is no rebound.     Hernia: A hernia is present. Hernia is present in the umbilical area.       Comments: Bruising LUQ Umbilical hernia reduceable  Genitourinary:    Testes:        Right: Testicular hydrocele present.        Left: Testicular hydrocele present.  Musculoskeletal:        General: Swelling present.     Right lower leg: Edema present.     Left lower leg: Edema present.  Skin:    Coloration: Skin is jaundiced.  No results found for any visits on 06/15/21.  Last CBC Lab Results  Component Value Date   WBC 2.1 (L) 03/08/2021   HGB 10.5 (L) 03/08/2021   HCT 33.6 (L) 03/08/2021   MCV 84.2 03/08/2021   MCH 26.3 03/08/2021   RDW 18.9 (H) 03/08/2021   PLT 36 (L) 03/08/2021   Last metabolic panel Lab Results  Component Value Date   GLUCOSE 123 (H) 03/08/2021   NA 142 03/08/2021   K 3.4 (L) 03/08/2021   CL 111 03/08/2021   CO2 24 03/08/2021   BUN <5 (L) 03/08/2021   CREATININE 0.47 (L) 03/08/2021   GFRNONAA >60 03/08/2021   CALCIUM 7.7 (L) 03/08/2021   PHOS 2.2 (L) 04/05/2020   PROT 7.0 03/08/2021   ALBUMIN 3.0 (L) 03/08/2021   LABGLOB 4.0 05/12/2020   AGRATIO 0.6 (L) 05/12/2020    BILITOT 2.6 (H) 03/08/2021   ALKPHOS 121 03/08/2021   AST 50 (H) 03/08/2021   ALT 19 03/08/2021   ANIONGAP 7 03/08/2021   Last lipids No results found for: CHOL, HDL, LDLCALC, LDLDIRECT, TRIG, CHOLHDL Last hemoglobin A1c Lab Results  Component Value Date   HGBA1C 4.7 (L) 10/14/2019   Last thyroid functions No results found for: TSH, T3TOTAL, T4TOTAL, THYROIDAB Last vitamin D Lab Results  Component Value Date   VD25OH 5.5 (L) 12/19/2019   Last vitamin B12 and Folate Lab Results  Component Value Date   VITAMINB12 872 03/08/2021   FOLATE 10.8 03/08/2021    The ASCVD Risk score (Arnett DK, et al., 2019) failed to calculate for the following reasons:   Cannot find a previous HDL lab   Cannot find a previous total cholesterol lab    Assessment & Plan:   Problem List Items Addressed This Visit       Digestive   Alcoholic cirrhosis of liver with ascites (HCC) - Primary    Alcohol induced cirrhosis with ascites worry about potential for spontaneous bacterial peritonitis or intraperitoneal bleeding with recent fall  We will need to check complete set of labs In the interim we will increase diuretic therapy as below May yet require ER referral  Increase Lasix to 40 mg BID & Spironolactone 100 mg BID.          Relevant Medications   folic acid (FOLVITE) 1 MG tablet   furosemide (LASIX) 40 MG tablet   spironolactone (ALDACTONE) 100 MG tablet   thiamine 100 MG tablet   Other Relevant Orders   Comprehensive metabolic panel   CBC with Differential/Platelet   Protime-INR   Lipase     Hematopoietic and Hemostatic   Thrombocytopenia (HCC) (Chronic)    CBC pending. Will continue to monitor.       Relevant Orders   CBC with Differential/Platelet     Other   ETOH abuse    Follow-up with Social Work. Counseled Pt on the importance of abstinence from alcohol for his conditions.       Relevant Orders   Comprehensive metabolic panel   CBC with  Differential/Platelet   Lipase   Microcytic anemia    Continue Ferrous Sulfate & Folic Acid. Will Continue to Monitor.       Relevant Medications   ferrous sulfate 325 (65 FE) MG tablet   folic acid (FOLVITE) 1 MG tablet   Other Relevant Orders   CBC with Differential/Platelet   Edema due to hypoalbuminemia    Increased Lasix to 40 mg BID Reassess albumin       Relevant  Medications   furosemide (LASIX) 40 MG tablet   Other Relevant Orders   Comprehensive metabolic panel   CBC with Differential/Platelet   Vitamin D deficiency   Relevant Medications   Vitamin D, Ergocalciferol, (DRISDOL) 1.25 MG (50000 UNIT) CAPS capsule   Umbilical hernia without obstruction and without gangrene    Surgery not Indicated at this time. Will continue to monitor.       Relevant Medications   furosemide (LASIX) 40 MG tablet   Other Visit Diagnoses     Acute abdominal pain       Relevant Orders   Comprehensive metabolic panel   CBC with Differential/Platelet   Lipase   Alcoholic cirrhosis of liver without ascites (HCC)       Relevant Medications   folic acid (FOLVITE) 1 MG tablet     38 minutes spent assessing patient language barrier added time  Return in about 2 weeks (around 06/29/2021).   Shan Levans, MD

## 2021-06-15 NOTE — Assessment & Plan Note (Signed)
CBC pending. Will continue to monitor.

## 2021-06-15 NOTE — Assessment & Plan Note (Signed)
Continue Ferrous Sulfate & Folic Acid. Will Continue to Monitor.

## 2021-06-15 NOTE — Assessment & Plan Note (Addendum)
Increased Lasix to 40 mg BID Reassess albumin

## 2021-06-15 NOTE — Assessment & Plan Note (Signed)
Surgery not Indicated at this time. Will continue to monitor.

## 2021-06-15 NOTE — Patient Instructions (Signed)
Complete set of labs will be obtained today we will call you results  Based on the lab results we may end up sending you to the emergency room for care in the morning or we may be able to arrange to have a procedure done where fluid is removed from your abdomen we will call you with this in the morning  If your abdominal pain worsens dramatically please go to the emergency room tonight go to [facility]  In any case she will return to see [name] in 2 weeks after the fluid is removed  Refills on all your medications sent to the pharmacy downstairs please pick those up before you leave our campus today  You will be taking furosemide twice a day and Aldactone twice a day for fluid removal    El conjunto completo de laboratorios se obtendr hoy. Le llamaremos resultados.  Segn los resultados de laboratorio, es posible que terminemos envindolo a la sala de emergencias para que lo atiendan por la maana o podemos hacer los arreglos para que se realice un procedimiento en el que se extraiga lquido de su abdomen. Lo llamaremos por la maana.  Si su dolor abdominal empeora dramticamente, vaya a la sala de emergencias esta noche, vaya al hospital [facility].  En cualquier caso, volver a ver al [name] en 2 semanas despus de que se elimine el lquido.  Resurtidos de todos sus medicamentos enviados a la farmacia de abajo, recjalos antes de salir de [place] [facility] hoy.  Tomar furosemida dos veces al da y Aldactone dos veces al da para la eliminacin de lquidos.

## 2021-06-16 ENCOUNTER — Telehealth: Payer: Self-pay

## 2021-06-16 ENCOUNTER — Emergency Department (HOSPITAL_COMMUNITY): Payer: Self-pay

## 2021-06-16 ENCOUNTER — Other Ambulatory Visit: Payer: Self-pay

## 2021-06-16 ENCOUNTER — Other Ambulatory Visit: Payer: Self-pay | Admitting: Critical Care Medicine

## 2021-06-16 ENCOUNTER — Inpatient Hospital Stay (HOSPITAL_COMMUNITY)
Admission: EM | Admit: 2021-06-16 | Discharge: 2021-06-22 | DRG: 433 | Disposition: A | Discharge status: Home / Self Care | Payer: Self-pay | Attending: Internal Medicine | Admitting: Internal Medicine

## 2021-06-16 ENCOUNTER — Encounter (HOSPITAL_COMMUNITY): Payer: Self-pay

## 2021-06-16 DIAGNOSIS — K8020 Calculus of gallbladder without cholecystitis without obstruction: Secondary | ICD-10-CM | POA: Diagnosis present

## 2021-06-16 DIAGNOSIS — D61818 Other pancytopenia: Secondary | ICD-10-CM

## 2021-06-16 DIAGNOSIS — D696 Thrombocytopenia, unspecified: Secondary | ICD-10-CM | POA: Diagnosis present

## 2021-06-16 DIAGNOSIS — K7030 Alcoholic cirrhosis of liver without ascites: Secondary | ICD-10-CM | POA: Diagnosis present

## 2021-06-16 DIAGNOSIS — G9341 Metabolic encephalopathy: Secondary | ICD-10-CM | POA: Diagnosis not present

## 2021-06-16 DIAGNOSIS — R600 Localized edema: Secondary | ICD-10-CM | POA: Diagnosis present

## 2021-06-16 DIAGNOSIS — K429 Umbilical hernia without obstruction or gangrene: Secondary | ICD-10-CM | POA: Diagnosis present

## 2021-06-16 DIAGNOSIS — I959 Hypotension, unspecified: Secondary | ICD-10-CM | POA: Diagnosis not present

## 2021-06-16 DIAGNOSIS — Z720 Tobacco use: Secondary | ICD-10-CM

## 2021-06-16 DIAGNOSIS — R011 Cardiac murmur, unspecified: Secondary | ICD-10-CM | POA: Diagnosis present

## 2021-06-16 DIAGNOSIS — K7031 Alcoholic cirrhosis of liver with ascites: Principal | ICD-10-CM | POA: Diagnosis present

## 2021-06-16 DIAGNOSIS — K652 Spontaneous bacterial peritonitis: Secondary | ICD-10-CM | POA: Diagnosis present

## 2021-06-16 DIAGNOSIS — I8510 Secondary esophageal varices without bleeding: Secondary | ICD-10-CM | POA: Diagnosis present

## 2021-06-16 DIAGNOSIS — T447X6A Underdosing of beta-adrenoreceptor antagonists, initial encounter: Secondary | ICD-10-CM | POA: Diagnosis present

## 2021-06-16 DIAGNOSIS — D509 Iron deficiency anemia, unspecified: Secondary | ICD-10-CM | POA: Diagnosis present

## 2021-06-16 DIAGNOSIS — R161 Splenomegaly, not elsewhere classified: Secondary | ICD-10-CM | POA: Diagnosis present

## 2021-06-16 DIAGNOSIS — F17210 Nicotine dependence, cigarettes, uncomplicated: Secondary | ICD-10-CM | POA: Diagnosis present

## 2021-06-16 DIAGNOSIS — K7290 Hepatic failure, unspecified without coma: Secondary | ICD-10-CM | POA: Diagnosis present

## 2021-06-16 DIAGNOSIS — Z91138 Patient's unintentional underdosing of medication regimen for other reason: Secondary | ICD-10-CM

## 2021-06-16 DIAGNOSIS — Z79899 Other long term (current) drug therapy: Secondary | ICD-10-CM

## 2021-06-16 DIAGNOSIS — R1084 Generalized abdominal pain: Secondary | ICD-10-CM

## 2021-06-16 DIAGNOSIS — K7682 Hepatic encephalopathy: Secondary | ICD-10-CM | POA: Diagnosis present

## 2021-06-16 DIAGNOSIS — E8809 Other disorders of plasma-protein metabolism, not elsewhere classified: Secondary | ICD-10-CM | POA: Diagnosis present

## 2021-06-16 DIAGNOSIS — F1010 Alcohol abuse, uncomplicated: Secondary | ICD-10-CM | POA: Diagnosis present

## 2021-06-16 LAB — CBC WITH DIFFERENTIAL/PLATELET
Abs Immature Granulocytes: 0 10*3/uL (ref 0.00–0.07)
Basophils Absolute: 0 10*3/uL (ref 0.0–0.2)
Basophils Absolute: 0 10*3/uL (ref 0.0–0.1)
Basophils Relative: 1 %
Basos: 1 %
EOS (ABSOLUTE): 0.1 10*3/uL (ref 0.0–0.4)
Eos: 3 %
Eosinophils Absolute: 0 10*3/uL (ref 0.0–0.5)
Eosinophils Relative: 1 %
HCT: 33.2 % — ABNORMAL LOW (ref 39.0–52.0)
Hematocrit: 28.4 % — ABNORMAL LOW (ref 37.5–51.0)
Hemoglobin: 9.4 g/dL — ABNORMAL LOW (ref 13.0–17.7)
Hemoglobin: 10.1 g/dL — ABNORMAL LOW (ref 13.0–17.0)
Immature Granulocytes: 0 %
Lymphocytes Absolute: 0.7 10*3/uL (ref 0.7–3.1)
Lymphocytes Relative: 39 %
Lymphs Abs: 0.6 10*3/uL — ABNORMAL LOW (ref 0.7–4.0)
Lymphs: 41 %
MCH: 28.9 pg (ref 26.6–33.0)
MCH: 29.1 pg (ref 26.0–34.0)
MCHC: 33.1 g/dL (ref 31.5–35.7)
MCHC: 30.4 g/dL (ref 30.0–36.0)
MCV: 87 fL (ref 79–97)
MCV: 95.7 fL (ref 80.0–100.0)
Monocytes Absolute: 0.2 10*3/uL (ref 0.1–0.9)
Monocytes Absolute: 0.1 10*3/uL (ref 0.1–1.0)
Monocytes Relative: 7 %
Monocytes: 11 %
Neutro Abs: 0.8 10*3/uL — ABNORMAL LOW (ref 1.7–7.7)
Neutrophils Absolute: 0.8 10*3/uL — ABNORMAL LOW (ref 1.4–7.0)
Neutrophils Relative %: 52 %
Neutrophils: 44 %
Platelets: 34 10*3/uL — CL (ref 150–450)
Platelets: 39 10*3/uL — ABNORMAL LOW (ref 150–400)
RBC: 3.25 x10E6/uL — ABNORMAL LOW (ref 4.14–5.80)
RBC: 3.47 MIL/uL — ABNORMAL LOW (ref 4.22–5.81)
RDW: 19 % — ABNORMAL HIGH (ref 11.6–15.4)
RDW: 19 % — ABNORMAL HIGH (ref 11.5–15.5)
WBC: 1.8 10*3/uL — CL (ref 3.4–10.8)
WBC: 1.6 10*3/uL — ABNORMAL LOW (ref 4.0–10.5)
nRBC: 0 % (ref 0.0–0.2)

## 2021-06-16 LAB — URINALYSIS, ROUTINE W REFLEX MICROSCOPIC
Glucose, UA: NEGATIVE mg/dL
Ketones, ur: NEGATIVE mg/dL
Leukocytes,Ua: NEGATIVE
Nitrite: NEGATIVE
Protein, ur: 30 mg/dL — AB
Specific Gravity, Urine: 1.016 (ref 1.005–1.030)
pH: 6 (ref 5.0–8.0)

## 2021-06-16 LAB — COMPREHENSIVE METABOLIC PANEL
ALT: 24 IU/L (ref 0–44)
ALT: 25 U/L (ref 0–44)
AST: 78 IU/L — ABNORMAL HIGH (ref 0–40)
AST: 73 U/L — ABNORMAL HIGH (ref 15–41)
Albumin/Globulin Ratio: 0.8 — ABNORMAL LOW (ref 1.2–2.2)
Albumin: 2.8 g/dL — ABNORMAL LOW (ref 4.0–5.0)
Albumin: 2.3 g/dL — ABNORMAL LOW (ref 3.5–5.0)
Alkaline Phosphatase: 146 IU/L — ABNORMAL HIGH (ref 44–121)
Alkaline Phosphatase: 112 U/L (ref 38–126)
Anion gap: 4 — ABNORMAL LOW (ref 5–15)
BUN/Creatinine Ratio: 9 (ref 9–20)
BUN: 5 mg/dL — ABNORMAL LOW (ref 6–24)
BUN: <5 mg/dL — ABNORMAL LOW (ref 6–20)
Bilirubin Total: 2.8 mg/dL — ABNORMAL HIGH (ref 0.0–1.2)
CO2: 23 mmol/L (ref 20–29)
CO2: 22 mmol/L (ref 22–32)
Calcium: 7.9 mg/dL — ABNORMAL LOW (ref 8.7–10.2)
Calcium: 7.9 mg/dL — ABNORMAL LOW (ref 8.9–10.3)
Chloride: 106 mmol/L (ref 96–106)
Chloride: 112 mmol/L — ABNORMAL HIGH (ref 98–111)
Creatinine, Ser: 0.54 mg/dL — ABNORMAL LOW (ref 0.76–1.27)
Creatinine, Ser: 0.58 mg/dL — ABNORMAL LOW (ref 0.61–1.24)
GFR, Estimated: >60 mL/min (ref >60)
Globulin, Total: 3.7 g/dL (ref 1.5–4.5)
Glucose, Bld: 135 mg/dL — ABNORMAL HIGH (ref 70–99)
Glucose: 120 mg/dL — ABNORMAL HIGH (ref 70–99)
Potassium: 3.5 mmol/L (ref 3.5–5.2)
Potassium: 3.5 mmol/L (ref 3.5–5.1)
Sodium: 136 mmol/L (ref 134–144)
Sodium: 138 mmol/L (ref 135–145)
Total Bilirubin: 3.6 mg/dL — ABNORMAL HIGH (ref 0.3–1.2)
Total Protein: 6.5 g/dL (ref 6.0–8.5)
Total Protein: 6.6 g/dL (ref 6.5–8.1)
eGFR: 127 mL/min/{1.73_m2} (ref >59)

## 2021-06-16 LAB — AMMONIA: Ammonia: 60 umol/L — ABNORMAL HIGH (ref 9–35)

## 2021-06-16 LAB — LIPASE, BLOOD: Lipase: 38 U/L (ref 11–51)

## 2021-06-16 LAB — PROTIME-INR
INR: 1.6 — ABNORMAL HIGH (ref 0.9–1.2)
INR: 1.8 — ABNORMAL HIGH (ref 0.8–1.2)
Prothrombin Time: 15.5 s — ABNORMAL HIGH (ref 9.1–12.0)
Prothrombin Time: 21.1 seconds — ABNORMAL HIGH (ref 11.4–15.2)

## 2021-06-16 LAB — LIPASE: Lipase: 36 U/L (ref 13–78)

## 2021-06-16 MED ORDER — IOHEXOL 300 MG/ML  SOLN
100.0000 mL | Freq: Once | INTRAMUSCULAR | Status: AC | PRN
  Administered 2021-06-16: 100 mL via INTRAVENOUS

## 2021-06-16 MED ORDER — FUROSEMIDE 10 MG/ML IJ SOLN: 40.0000 mg | Freq: Once | INTRAMUSCULAR | Status: DC

## 2021-06-16 NOTE — Telephone Encounter (Signed)
Called and left vm for scheduling for the paracentesis will call back tomorrow for scheduling   Routing to let you know!

## 2021-06-16 NOTE — ED Provider Notes (Signed)
[facility] EMERGENCY DEPARTMENT Provider Note   CSN: [id number] Arrival date & time: 06/16/21  1353     History  Chief Complaint  Patient presents with   Abdominal Pain    Mathew [name] is a 41 y.o. male.  Pt complains of increased abdominal swelling.  Patient has a history of alcohol abuse he reports he does currently drink.  Patient was seen by Dr. Joya Gaskins yesterday and advised to come to the emergency department for evaluation.  He has a history of ascites.  Patient has had a paracentesis in the past.  Patient reports last paracentesis was over 2 years ago.  Patient reports he has a hernia he has been seen by surgery.  The history is provided by the patient. No language interpreter was used.  Abdominal Pain Pain location:  Generalized Pain quality: aching, fullness and pressure   Pain radiates to:  Does not radiate Pain severity:  Moderate Onset quality:  Gradual Duration:  1 week Timing:  Constant Progression:  Worsening Context: alcohol use   Relieved by:  Nothing Worsened by:  Nothing Ineffective treatments:  None tried Associated symptoms: shortness of breath   Risk factors: alcohol abuse        Home Medications Prior to Admission medications   Medication Sig Start Date End Date Taking? Authorizing Provider  famotidine (PEPCID) 20 MG tablet Take 1 tablet (20 mg total) by mouth 2 (two) times daily. 06/15/21   [name], MD  ferrous sulfate 325 (65 FE) MG tablet Take 1 tablet (325 mg total) by mouth 3 (three) times daily with meals. 06/15/21   [name], MD  folic acid (FOLVITE) 1 MG tablet Take 1 tablet (1 mg total) by mouth daily. 06/15/21 06/15/22  [name], MD  furosemide (LASIX) 40 MG tablet Take 1 tablet (40 mg total) by mouth 2 (two) times daily. 06/15/21   [name], MD  spironolactone (ALDACTONE) 100 MG tablet Take 1 tablet (100 mg total) by mouth 2 (two) times daily. 06/15/21   [name], MD  thiamine  100 MG tablet Take 1 tablet (100 mg total) by mouth daily. 06/15/21   [name], MD  Vitamin D, Ergocalciferol, (DRISDOL) 1.25 MG (50000 UNIT) CAPS capsule Take 1 capsule (50,000 Units total) by mouth every 7 (seven) days (Tuesdays) 06/15/21   [name], MD  pantoprazole (PROTONIX) 40 MG tablet Take 1 tablet (40 mg total) by mouth daily. Patient not taking: Reported on 04/03/2020 03/31/20 04/03/20  Charlott Rakes, MD      Allergies    Patient has no known allergies.    Review of Systems   Review of Systems  Respiratory:  Positive for shortness of breath.   Gastrointestinal:  Positive for abdominal pain.  All other systems reviewed and are negative.   Physical Exam Updated Vital Signs BP 129/88   Pulse 85   Temp 98.3 F (36.8 C) (Oral)   Resp 15   SpO2 99%  Physical Exam Vitals and nursing note reviewed.  Constitutional:      General: He is not in acute distress.    Appearance: He is well-developed.  HENT:     Head: Normocephalic and atraumatic.  Eyes:     Conjunctiva/sclera: Conjunctivae normal.  Cardiovascular:     Rate and Rhythm: Normal rate and regular rhythm.     Heart sounds: No murmur heard. Pulmonary:     Effort: Pulmonary effort is normal. No respiratory distress.  Breath sounds: Normal breath sounds.  Abdominal:     General: There is distension.     Palpations: Abdomen is soft. There is hepatomegaly.     Tenderness: There is generalized abdominal tenderness.     Hernia: A hernia is present. Hernia is present in the umbilical area.  Musculoskeletal:        General: No swelling.     Cervical back: Neck supple.  Skin:    General: Skin is warm and dry.     Capillary Refill: Capillary refill takes less than 2 seconds.  Neurological:     Mental Status: He is alert.  Psychiatric:        Mood and Affect: Mood normal.     ED Results / Procedures / Treatments   Labs (all labs ordered are listed, but only abnormal results are displayed) Labs  Reviewed  CBC WITH DIFFERENTIAL/PLATELET - Abnormal; Notable for the following components:      Result Value   WBC 1.6 (*)    RBC 3.47 (*)    Hemoglobin 10.1 (*)    HCT 33.2 (*)    RDW 19.0 (*)    Platelets 39 (*)    Neutro Abs 0.8 (*)    Lymphs Abs 0.6 (*)    All other components within normal limits  COMPREHENSIVE METABOLIC PANEL - Abnormal; Notable for the following components:   Chloride 112 (*)    Glucose, Bld 135 (*)    BUN <5 (*)    Creatinine, Ser 0.58 (*)    Calcium 7.9 (*)    Albumin 2.3 (*)    AST 73 (*)    Total Bilirubin 3.6 (*)    Anion gap 4 (*)    All other components within normal limits  URINALYSIS, ROUTINE W REFLEX MICROSCOPIC - Abnormal; Notable for the following components:   Color, Urine AMBER (*)    Hgb urine dipstick MODERATE (*)    Bilirubin Urine SMALL (*)    Protein, ur 30 (*)    Bacteria, UA FEW (*)    All other components within normal limits  AMMONIA - Abnormal; Notable for the following components:   Ammonia 60 (*)    All other components within normal limits  PROTIME-INR - Abnormal; Notable for the following components:   Prothrombin Time 21.1 (*)    INR 1.8 (*)    All other components within normal limits  LIPASE, BLOOD    EKG None  Radiology CT Abdomen Pelvis W Contrast  Result Date: 06/16/2021 CLINICAL DATA:  Abdominal pain. EXAM: CT ABDOMEN AND PELVIS WITH CONTRAST TECHNIQUE: Multidetector CT imaging of the abdomen and pelvis was performed using the standard protocol following bolus administration of intravenous contrast. RADIATION DOSE REDUCTION: This exam was performed according to the departmental dose-optimization program which includes automated exposure control, adjustment of the mA and/or kV according to patient size and/or use of iterative reconstruction technique. CONTRAST:  OMNIPAQUE IOHEXOL 300 MG/ML  SOLN COMPARISON:  CT abdomen and pelvis 02/23/2021 FINDINGS: Lower chest: Respiratory motion artifact with mild  atelectasis in the lung bases. New small left pleural effusion. Borderline cardiomegaly. Hepatobiliary: Cirrhosis. Small calcified stones in the gallbladder. No abnormal gallbladder distension. No biliary dilatation. Pancreas: Normal parenchymal enhancement.  No ductal dilatation. Spleen: Mild splenomegaly. Adrenals/Urinary Tract: Unremarkable adrenal glands. No evidence of a renal mass, calculi, or hydronephrosis. Moderate circumferential bladder wall thickening. Stomach/Bowel: The stomach is unremarkable. There is wall thickening of the duodenum and multiple loops of proximal jejunum. There is wall  thickening of the cecum, ascending colon, and possibly transverse colon although the appearance may be partially due to under distension. There is no evidence of bowel obstruction. The appendix is unremarkable. Vascular/Lymphatic: Normal caliber of the abdominal aorta. Patent main portal vein and splenic vein. No enlarged lymph nodes. Reproductive: Unremarkable prostate. Other: Moderately large volume ascites, increased from prior. No loculated fluid collection or pneumoperitoneum. Diffuse haziness/edema throughout the mesenteric fat. Small paraumbilical hernia containing fluid. Musculoskeletal: No acute osseous abnormality or suspicious osseous lesion. IMPRESSION: 1. Cirrhosis and mild splenomegaly with increased, moderately large volume ascites. 2. Wall thickening of the duodenum, proximal jejunum, and right colon which may reflect portal enterocolopathy although enteritis is not excluded. 3. Bladder wall thickening.  Correlate for cystitis. 4. New small left pleural effusion. 5. Cholelithiasis. Electronically Signed   By: Logan Bores M.D.   On: 06/16/2021 15:51    Procedures Procedures    Medications Ordered in ED Medications  iohexol (OMNIPAQUE) 300 MG/ML solution 100 mL (100 mLs Intravenous Contrast Given 06/16/21 1527)    ED Course/ Medical Decision Making/ A&P Clinical Course as of 06/17/21 0753  Thu  Jun 17, 2021  0049 Consult to Dr. Marlowe Sax, who is agreeable to admitting this patient to her service. I appreciate her collaboration in the care of this patient. [RS]    Clinical Course User Index [RS] Sponseller, Gypsy Balsam, PA-C                           Medical Decision Making Patient has a history of alcoholic cirrhosis.  He complains of increased abdominal swelling.  Patient also reports he has a hernia that is sticking out father.  Amount and/or Complexity of Data Reviewed Independent Historian: friend    Details: Patient is here with a friend who reports patient is a heavy drinker External Data Reviewed: notes.    Details: Primary care notes from Dr. Joya Gaskins reviewed Labs: ordered. Decision-making details documented in ED Course.    Details: Labs ordered reviewed and interpreted patient is white blood cell count of 1.6 platelets are low at 39 patient's hemoglobin is 10.1 chemistries show a total bilirubin of 3.6 AST is 37 albumin is 2.3 Radiology: ordered and independent interpretation performed.    Details: CT scan of patient's abdomen shows cirrhosis [place] and moderately large volume of ascites possible colitis patient also has bladder wall thickening a new small left pleural effusion also cholelithiasis Discussion of management or test interpretation with external provider(s): Unassigned medicine consulted for admision  Risk Prescription drug management. Decision regarding hospitalization. Risk Details: Pt uncomfortable from abdominal distention.  Pt needs paracentesis.  Pt has elevated ammonia and tbili.   Pt has lower extremity edema that pt reports is worse.  Pt has a history of hypoalbuminemia related edema.             Final Clinical Impression(s) / ED Diagnoses Final diagnoses:  Alcoholic cirrhosis of liver with ascites ([place])    Rx / DC Orders ED Discharge Orders     None      Pt's care turned over to oncoming PA .  Pt to be admitted    Fransico Meadow, [place] 06/17/21 [place], [place], MD 06/20/21 1626

## 2021-06-16 NOTE — ED Provider Triage Note (Signed)
Emergency Medicine Provider Triage Evaluation Note  [name] , a 41 y.o. male  was evaluated in triage.  Pt complains of abdominal pain.  He is is having swelling to his abdomen which is going on for months.  Saw his doctor yesterday, scheduled for outpatient paracentesis but patient got nervous today and called EMS instead.  He is having severe abdominal pain in the middle of his abdomen, no nausea or vomiting.  Denies fever or mental status change..  Review of Systems  Per HPI  Physical Exam  BP (!) 135/97   Pulse 95   Temp 98.3 F (36.8 C) (Oral)   Resp 16   SpO2 100%  Gen:   Awake, no distress  Resp:  Normal effort  MSK:   Moves extremities without difficulty  Other:  Abdomen distended with guarding.  Diffuse tenderness, right upper quadrant/epigastric primarily.  Medical Decision Making  Medically screening exam initiated at 2:08 PM.  Appropriate orders placed.  Gilmer Kaminsky [name] was informed that the remainder of the evaluation will be completed by another provider, this initial triage assessment does not replace that evaluation, and the importance of remaining in the ED until their evaluation is complete.     [name], PA-C 06/16/21 1800

## 2021-06-16 NOTE — ED Triage Notes (Signed)
Pt with hx alcoholic cirrhosis via EMS advised to come to ED for possible paracentesis after visit to PCP yesterday d/t increased abdominal pain and swelling x 1 month. Denies n/v.

## 2021-06-16 NOTE — ED Provider Notes (Signed)
  Physical Exam  BP 118/84   Pulse 77   Temp 98.3 F (36.8 C) (Oral)   Resp 12   SpO2 94%   Physical Exam  Procedures  Procedures  ED Course / MDM   Clinical Course as of 06/17/21 0610  Thu Jun 17, 2021  3154 Consult to Dr. Loney Loh, who is agreeable to admitting Mathew [name] to her service. I appreciate her collaboration in the care of Mathew [name]. [RS]    Clinical Course User Index [RS] Maleigha Colvard, Eugene Gavia, PA-C   Medical Decision Making Amount and/or Complexity of Data Reviewed Radiology: ordered.  Risk Prescription drug management. Decision regarding hospitalization.    Care of Mathew [name] assumed preceding ED provider Mathew Mangle, PA-C at which change.  Please see her associated note for further insight of the [name]'s ED course.  In brief, [name] is a known alcoholic cirrhotic who presents with worsening abdominal pain shortness of breath in context of significant ascites .  Pancytopenic with notable thrombocytopenia which inhibited Mathew [name] outpatient paracentesis.  Return to the ED by gastroenterologist.  At time of shift change all [name] is waiting for Mathew [name] consult call from hospital medicine.  Plan is to admit the [name] for stabilization given Mathew [name] poor appearance despite normal hemodynamics in the ED, as well as for therapeutic paracentesis.  Consult to Dr. Loney Loh as above.  No further work-up is warranted near Mathew time.  Mathew [name] voiced understanding of Mathew [name] medical evaluation and treatment plan. Each of Mathew [name] questions was answered to Mathew [name] expressed satisfaction. He is amenable to plan for admission at Mathew time.   Mathew chart was dictated using voice recognition software, Dragon. Despite the best efforts of Mathew provider to proofread and correct errors, errors may still occur which can change documentation meaning.      Sherrilee Gilles 06/17/21 0086    Marily Memos, MD 06/18/21 Mathew [name]

## 2021-06-17 ENCOUNTER — Observation Stay (HOSPITAL_COMMUNITY): Payer: Self-pay

## 2021-06-17 ENCOUNTER — Other Ambulatory Visit: Payer: Self-pay

## 2021-06-17 ENCOUNTER — Inpatient Hospital Stay (HOSPITAL_COMMUNITY): Payer: Self-pay

## 2021-06-17 ENCOUNTER — Emergency Department (HOSPITAL_COMMUNITY): Payer: Self-pay

## 2021-06-17 DIAGNOSIS — F1010 Alcohol abuse, uncomplicated: Secondary | ICD-10-CM

## 2021-06-17 DIAGNOSIS — K7290 Hepatic failure, unspecified without coma: Secondary | ICD-10-CM

## 2021-06-17 DIAGNOSIS — R011 Cardiac murmur, unspecified: Secondary | ICD-10-CM

## 2021-06-17 DIAGNOSIS — K429 Umbilical hernia without obstruction or gangrene: Secondary | ICD-10-CM

## 2021-06-17 DIAGNOSIS — D509 Iron deficiency anemia, unspecified: Secondary | ICD-10-CM

## 2021-06-17 DIAGNOSIS — K7460 Unspecified cirrhosis of liver: Secondary | ICD-10-CM

## 2021-06-17 DIAGNOSIS — K7031 Alcoholic cirrhosis of liver with ascites: Secondary | ICD-10-CM

## 2021-06-17 DIAGNOSIS — K652 Spontaneous bacterial peritonitis: Secondary | ICD-10-CM

## 2021-06-17 DIAGNOSIS — E8809 Other disorders of plasma-protein metabolism, not elsewhere classified: Secondary | ICD-10-CM

## 2021-06-17 HISTORY — PX: IR PARACENTESIS: IMG2679

## 2021-06-17 HISTORY — DX: Spontaneous bacterial peritonitis: K65.2

## 2021-06-17 LAB — BODY FLUID CELL COUNT WITH DIFFERENTIAL
Eos, Fluid: 0 %
Lymphs, Fluid: 53 %
Monocyte-Macrophage-Serous Fluid: 46 % — ABNORMAL LOW (ref 50–90)
Neutrophil Count, Fluid: 1 % (ref 0–25)
Total Nucleated Cell Count, Fluid: 22 cu mm (ref 0–1000)

## 2021-06-17 LAB — HIV ANTIBODY (ROUTINE TESTING W REFLEX): HIV Screen 4th Generation wRfx: NONREACTIVE

## 2021-06-17 LAB — GRAM STAIN

## 2021-06-17 LAB — AMYLASE, PLEURAL OR PERITONEAL FLUID: Amylase, Fluid: 13 U/L

## 2021-06-17 LAB — URINALYSIS, ROUTINE W REFLEX MICROSCOPIC
Bilirubin Urine: NEGATIVE
Glucose, UA: NEGATIVE mg/dL
Hgb urine dipstick: NEGATIVE
Ketones, ur: NEGATIVE mg/dL
Leukocytes,Ua: NEGATIVE
Nitrite: NEGATIVE
Protein, ur: NEGATIVE mg/dL
Specific Gravity, Urine: 1.025 (ref 1.005–1.030)
pH: 8 (ref 5.0–8.0)

## 2021-06-17 LAB — ECHOCARDIOGRAM COMPLETE
AR max vel: 1.8 cm2
AV Area VTI: 1.79 cm2
AV Area mean vel: 2.03 cm2
AV Mean grad: 8 mmHg
AV Peak grad: 16.2 mmHg
Ao pk vel: 2.01 m/s
Area-P 1/2: 3.46 cm2
Calc EF: 58.4 %
S' Lateral: 3.2 cm
Single Plane A2C EF: 59.8 %
Single Plane A4C EF: 57.9 %

## 2021-06-17 LAB — RETICULOCYTES
Immature Retic Fract: 19 % — ABNORMAL HIGH (ref 2.3–15.9)
RBC.: 2.91 MIL/uL — ABNORMAL LOW (ref 4.22–5.81)
Retic Count, Absolute: 111.7 10*3/uL (ref 19.0–186.0)
Retic Ct Pct: 3.8 % — ABNORMAL HIGH (ref 0.4–3.1)

## 2021-06-17 LAB — MAGNESIUM: Magnesium: 1.7 mg/dL (ref 1.7–2.4)

## 2021-06-17 LAB — FOLATE: Folate: 10.9 ng/mL (ref >5.9)

## 2021-06-17 LAB — ALBUMIN, PLEURAL OR PERITONEAL FLUID: Albumin, Fluid: <1.5 g/dL

## 2021-06-17 LAB — PHOSPHORUS: Phosphorus: 3.6 mg/dL (ref 2.5–4.6)

## 2021-06-17 MED ORDER — LORAZEPAM 2 MG/ML IJ SOLN: 1.0000 mg | INTRAMUSCULAR | Status: AC | PRN

## 2021-06-17 MED ORDER — ACETAMINOPHEN 650 MG RE SUPP: 650.0000 mg | Freq: Four times a day (QID) | RECTAL | Status: DC | PRN

## 2021-06-17 MED ORDER — ONDANSETRON HCL 4 MG/2ML IJ SOLN: 4.0000 mg | Freq: Four times a day (QID) | INTRAMUSCULAR | Status: DC | PRN

## 2021-06-17 MED ORDER — FOLIC ACID 1 MG PO TABS: 1.0000 mg | ORAL_TABLET | Freq: Every day | ORAL | Status: DC

## 2021-06-17 MED ORDER — THIAMINE HCL 100 MG PO TABS: 100.0000 mg | ORAL_TABLET | Freq: Every day | ORAL | Status: DC

## 2021-06-17 MED ORDER — ADULT MULTIVITAMIN W/MINERALS CH
1.0000 | ORAL_TABLET | Freq: Every day | ORAL | Status: DC
  Administered 2021-06-17 – 2021-06-20 (×4): 1 via ORAL
  Filled 2021-06-17 (×4): qty 1

## 2021-06-17 MED ORDER — SODIUM CHLORIDE 0.9% FLUSH: 3.0000 mL | INTRAVENOUS | Status: DC | PRN

## 2021-06-17 MED ORDER — LORAZEPAM 1 MG PO TABS
1.0000 mg | ORAL_TABLET | ORAL | Status: AC | PRN
  Administered 2021-06-20: 2 mg via ORAL
  Administered 2021-06-20: 1 mg via ORAL
  Filled 2021-06-17: qty 2
  Filled 2021-06-17: qty 4
  Filled 2021-06-17: qty 1

## 2021-06-17 MED ORDER — ALBUMIN HUMAN 25 % IV SOLN
12.5000 g | Freq: Four times a day (QID) | INTRAVENOUS | Status: AC
  Administered 2021-06-17 (×2): 12.5 g via INTRAVENOUS
  Filled 2021-06-17 (×2): qty 50

## 2021-06-17 MED ORDER — THIAMINE HCL 100 MG PO TABS
100.0000 mg | ORAL_TABLET | Freq: Every day | ORAL | Status: DC
Start: 2021-06-17 — End: 2021-06-20
  Administered 2021-06-17 – 2021-06-19 (×3): 100 mg via ORAL
  Filled 2021-06-17 (×4): qty 1

## 2021-06-17 MED ORDER — LIDOCAINE HCL (PF) 1 % IJ SOLN
INTRAMUSCULAR | Status: DC | PRN
  Administered 2021-06-17: 10 mL

## 2021-06-17 MED ORDER — LIDOCAINE HCL 1 % IJ SOLN
INTRAMUSCULAR | Status: AC
  Filled 2021-06-17: qty 20

## 2021-06-17 MED ORDER — SODIUM CHLORIDE 0.9% FLUSH
3.0000 mL | Freq: Two times a day (BID) | INTRAVENOUS | Status: DC
  Administered 2021-06-17 – 2021-06-22 (×8): 3 mL via INTRAVENOUS

## 2021-06-17 MED ORDER — LORAZEPAM 1 MG PO TABS
0.0000 mg | ORAL_TABLET | Freq: Four times a day (QID) | ORAL | Status: AC
  Administered 2021-06-17 (×2): 1 mg via ORAL
  Filled 2021-06-17 (×2): qty 1

## 2021-06-17 MED ORDER — MORPHINE SULFATE (PF) 2 MG/ML IV SOLN
2.0000 mg | INTRAVENOUS | Status: DC | PRN
  Administered 2021-06-17 – 2021-06-18 (×3): 2 mg via INTRAVENOUS
  Filled 2021-06-17 (×3): qty 1

## 2021-06-17 MED ORDER — OXYCODONE HCL 5 MG PO TABS
5.0000 mg | ORAL_TABLET | ORAL | Status: DC | PRN
  Administered 2021-06-18 – 2021-06-22 (×7): 5 mg via ORAL
  Filled 2021-06-17 (×7): qty 1

## 2021-06-17 MED ORDER — SODIUM CHLORIDE 0.9 % IV SOLN
2.0000 g | INTRAVENOUS | Status: DC
  Administered 2021-06-18 – 2021-06-21 (×4): 2 g via INTRAVENOUS
  Filled 2021-06-17 (×4): qty 20

## 2021-06-17 MED ORDER — THIAMINE HCL 100 MG/ML IJ SOLN: 100.0000 mg | Freq: Every day | INTRAMUSCULAR | Status: DC

## 2021-06-17 MED ORDER — ONDANSETRON HCL 4 MG PO TABS: 4.0000 mg | ORAL_TABLET | Freq: Four times a day (QID) | ORAL | Status: DC | PRN

## 2021-06-17 MED ORDER — FOLIC ACID 1 MG PO TABS
1.0000 mg | ORAL_TABLET | Freq: Every day | ORAL | Status: DC
  Administered 2021-06-17 – 2021-06-20 (×4): 1 mg via ORAL
  Filled 2021-06-17 (×4): qty 1

## 2021-06-17 MED ORDER — SODIUM CHLORIDE 0.9 % IV SOLN: 250.0000 mL | INTRAVENOUS | Status: DC | PRN

## 2021-06-17 MED ORDER — ACETAMINOPHEN 325 MG PO TABS: 650.0000 mg | ORAL_TABLET | Freq: Four times a day (QID) | ORAL | Status: DC | PRN

## 2021-06-17 MED ORDER — LORAZEPAM 1 MG PO TABS
0.0000 mg | ORAL_TABLET | Freq: Two times a day (BID) | ORAL | Status: AC
  Administered 2021-06-20: 2 mg via ORAL
  Administered 2021-06-20: 4 mg via ORAL
  Administered 2021-06-20: 2 mg via ORAL
  Filled 2021-06-17 (×2): qty 2

## 2021-06-17 MED ORDER — CEFTRIAXONE SODIUM 2 G IJ SOLR
2.0000 g | Freq: Once | INTRAMUSCULAR | Status: AC
  Administered 2021-06-17: 2 g via INTRAVENOUS
  Filled 2021-06-17: qty 20

## 2021-06-17 NOTE — Telephone Encounter (Signed)
Pt in ED at hospital so that is why he would not answer   cancel effort at getting outpatient [place]

## 2021-06-17 NOTE — Plan of Care (Signed)
  Problem: Education: Goal: Knowledge of General Education information will improve Description: Including pain rating scale, medication(s)/side effects and non-pharmacologic comfort measures Outcome: Progressing   Problem: Health Behavior/Discharge Planning: Goal: Ability to manage health-related needs will improve Outcome: Progressing   Problem: Clinical Measurements: Goal: Ability to maintain clinical measurements within normal limits will improve Outcome: Progressing Goal: Will remain free from infection Outcome: Progressing Goal: Diagnostic test results will improve Outcome: Progressing Goal: Respiratory complications will improve Outcome: Progressing Goal: Cardiovascular complication will be avoided Outcome: Progressing   Problem: Activity: Goal: Risk for activity intolerance will decrease Outcome: Progressing   Problem: Nutrition: Goal: Adequate nutrition will be maintained Outcome: Progressing   Problem: Coping: Goal: Level of anxiety will decrease Outcome: Progressing   Problem: Elimination: Goal: Will not experience complications related to bowel motility Outcome: Progressing Goal: Will not experience complications related to urinary retention Outcome: Progressing   Problem: Pain Managment: Goal: General experience of comfort will improve Outcome: Progressing   Problem: Safety: Goal: Ability to remain free from injury will improve Outcome: Progressing   Problem: Skin Integrity: Goal: Risk for impaired skin integrity will decrease Outcome: Progressing   Problem: Education: Goal: Ability to demonstrate appropriate child care will improve Outcome: Progressing Goal: Ability to verbalize an understanding of newborn treatment and procedures will improve Outcome: Progressing Goal: Ability to demonstrate an understanding of appropriate nutrition and feeding will improve Outcome: Progressing Goal: Individualized Educational Video(s) Outcome: Progressing    Problem: Nutritional: Goal: Nutritional status of the infant will improve as evidenced by minimal weight loss and appropriate weight gain for gestational age Outcome: Progressing Goal: Ability to maintain a balanced intake and output will improve Outcome: Progressing   Problem: Clinical Measurements: Goal: Ability to maintain clinical measurements within normal limits will improve Outcome: Progressing   Problem: Skin Integrity: Goal: Risk for impaired skin integrity will decrease Outcome: Progressing Goal: Demonstrates signs of wound healing without infection Outcome: Progressing   

## 2021-06-17 NOTE — Procedures (Signed)
PROCEDURE SUMMARY:  Successful US guided paracentesis from right lateral abdomen.  Yielded 5.4 liters of clear yellow fluid.  No immediate complications.  Patient tolerated well.  EBL = trace  Specimen was sent for labs.  Saidy Ormand S Willy Vorce PA-C 06/17/2021 2:16 PM

## 2021-06-17 NOTE — Progress Notes (Signed)
Patient received from ED via stretcher.  Patient is able to walk to the bed.  Assisted in bed in position of comfort.  Oriented to room and unit routine.  Call bell within reach.  Needs addressed.

## 2021-06-17 NOTE — Telephone Encounter (Signed)
Noted  

## 2021-06-17 NOTE — H&P (Addendum)
History and Physical    Patient: Mathew [name] DOB: 1980-04-15 DOA: 06/16/2021 DOS: the patient was seen and examined on 06/17/2021 PCP: [name], MD  Patient coming from: Home  Chief Complaint:  Chief Complaint  Patient presents with   Abdominal Pain   HPI: Mathew [name] is a 41 y.o. male with medical history significant of alcohol-related cirrhosis, recurrent ascites, nonadherence to medical therapy and umbilical hernia.  In regards to the hernia patient has been evaluated by surgery but unless incarcerated and needs urgent surgery he is not an elective surgical candidate.  Patient follows with the [facility] by Dr. Joya Gaskins.  He has been seen recently in the office (06/15/2021) and plans were made to proceed with outpatient paracentesis.  Patient has developed increasing abdominal pain and was sent to the ER for evaluation  In the ER he was noted to have significant abdominal discomfort, massive ascites, total bilirubin up to 3.6 which is higher than baseline of 2.3.  Hospitalist service was asked to admit this patient for possible SBP and therapeutic/diagnostic paracentesis.  The patient reports he has had diffuse abdominal pain for several weeks which has worsened over the past several days.  He states that he drinks about 2 beers per day but not daily. A few days ago he attempted to drink a beer but it made him vomit.  He denies any GI symptoms such as blood or dark emesis.  He states his bowel movements are normal.  No change in urinary output or ability to void.  No shortness of breath or chest pain.  Of note patient did undergo an EGD in 2020 which showed mild portal gastropathy and grade 2 varices.  Patient is currently reporting that he is hungry.  Review of Systems: As mentioned in the history of present illness. All other systems reviewed and are negative.  Past Medical History:  Diagnosis Date   Alcohol  withdrawal seizure ([place])    Cirrhosis ([place])    ETOH abuse    Pneumonia 04/02/2020   Past Surgical History:  Procedure Laterality Date   COLONOSCOPY WITH PROPOFOL N/A 03/17/2018   Procedure: COLONOSCOPY WITH PROPOFOL;  Surgeon: Ronald Lobo, MD;  Location: [facility];  Service: Endoscopy;  Laterality: N/A;   ESOPHAGOGASTRODUODENOSCOPY (EGD) WITH PROPOFOL N/A 03/17/2018   Procedure: ESOPHAGOGASTRODUODENOSCOPY (EGD) WITH PROPOFOL;  Surgeon: Ronald Lobo, MD;  Location: [facility];  Service: Endoscopy;  Laterality: N/A;   IR PARACENTESIS  03/14/2018   IR PARACENTESIS  11/18/2020   Social History:  reports that he has been smoking cigarettes. He started smoking about 22 months ago. He has been smoking an average of .25 packs per day. He has never used smokeless tobacco. He reports current alcohol use. He reports that he does not use drugs.  No Known Allergies  Family History  Problem Relation Age of Onset   Cancer Neg Hx    Heart disease Neg Hx     Prior to Admission medications   Medication Sig Start Date End Date Taking? Authorizing Provider  thiamine 100 MG tablet Take 1 tablet (100 mg total) by mouth daily. 06/15/21  Yes [name], MD  Vitamin D, Ergocalciferol, (DRISDOL) 1.25 MG (50000 UNIT) CAPS capsule Take 1 capsule (50,000 Units total) by mouth every 7 (seven) days (Tuesdays) Patient taking differently: Take 50,000 Units by mouth every Wednesday. 06/15/21  Yes [name], MD  famotidine (PEPCID) 20 MG tablet Take 1 tablet (  20 mg total) by mouth 2 (two) times daily. 06/15/21   [name], MD  ferrous sulfate 325 (65 FE) MG tablet Take 1 tablet (325 mg total) by mouth 3 (three) times daily with meals. 06/15/21   [name], MD  folic acid (FOLVITE) 1 MG tablet Take 1 tablet (1 mg total) by mouth daily. 06/15/21 06/15/22  [name], MD  furosemide (LASIX) 40 MG tablet Take 1 tablet (40 mg total) by mouth 2 (two) times daily. 06/15/21   [name], MD   spironolactone (ALDACTONE) 100 MG tablet Take 1 tablet (100 mg total) by mouth 2 (two) times daily. 06/15/21   [name], MD  pantoprazole (PROTONIX) 40 MG tablet Take 1 tablet (40 mg total) by mouth daily. Patient not taking: Reported on 04/03/2020 03/31/20 04/03/20  Charlott Rakes, MD    Physical Exam: Vitals:   06/17/21 0745 06/17/21 0753 06/17/21 0815 06/17/21 0830  BP: 124/71 124/71 119/80 123/76  Pulse: 89 89 79 79  Resp:    18  Temp:      TempSrc:      SpO2: 98% 98% 98% 97%   Constitutional: NAD, calm, comfortable Eyes: PERRL, lids and conjunctivae normal ENMT: Mucous membranes are moist. Posterior pharynx clear of any exudate or lesions.Normal dentition.  Neck: normal, supple, no masses, no thyromegaly Respiratory: clear to auscultation bilaterally but decreased in the bases, no wheezing, no crackles. Normal respiratory effort. No accessory muscle use.  Room air Cardiovascular: Regular rate and rhythm, no rubs / gallops.  Early systolic murmur left sternal border second intercostal space.  2+ bilateral lower extremity edema. 2+ pedal pulses. No carotid bruits.  Abdomen: Diffuse tenderness with palpation, no masses palpated. No hepatosplenomegaly. Bowel sounds positive.  Distended umbilical hernia with the skin discolored and dusky in appearance but hernia is soft and easily reducible although does not stay reduced Musculoskeletal: no clubbing / cyanosis. No joint deformity upper and lower extremities. Good ROM, no contractures. Normal muscle tone.  Skin: no rashes, lesions, ulcers. No induration Neurologic: CN 2-12 grossly intact. Sensation intact, DTR normal. Strength 5/5 x all 4 extremities.  Psychiatric: Normal judgment and insight. Alert and oriented x 3. Normal mood.   Data Reviewed:  Sodium 138, potassium 3.5, CO2 22, glucose 135, BUN less than 5, creatinine 0.58, calcium 7.9, anion gap 4, albumin 2.3, AST 73, ALT 25, total bilirubin 3.6, WBCs 1600 with neutrophils  0.8 and lymphocytes 0.6, hemoglobin 10.1, platelets 39,000, PT 12.1, INR 1.8, urinalysis abnormal with moderate hemoglobin and 30 of protein, bacteria few, WBC 0-5.  CT abdomen as described above.  Chest x-ray for inspiratory effort and left basilar atelectasis  Assessment and Plan:  Acute abdominal pain secondary to massive ascites and suspected SBP Suspect massive ascites contributing to pain but also concerned of possible underlying SBP Order placed for IR to perform paracentesis with usual labs including culture.  Paracentesis has been ordered as diagnostic and therapeutic CT abdomen and pelvis done in ER and does demonstrate wall thickening of the duodenum, proximal jejunum and right colon which radiologist suspects is secondary to an enterocolopathy but enteritis cannot be excluded Begin Rocephin and follow-up on ascites fluid culture IV morphine 2 mg every 2 hours as needed for abdominal discomfort; also has oxycodone available  Mildly decompensated alcoholic cirrhosis Total bilirubin has increased to 3.6 from a baseline of 2.3, PT 21.1 and INR 1.8 Continue to follow labs Ammonia level 60, patient alert and oriented-a.m. AST and ALT  stable Last recent CIWA 10.  Begin Ativan withdrawal protocol  Known grade 2 varices Patient denies any red or black emesis or stools  Thrombocytopenia/neutropenia Secondary to cirrhosis and current readings are stable with platelets in the 50,000 range and WBC in the 1.3-1.5 range at his baseline No signs of bleeding Follow lab  Lower extremity edema multifactorial Secondary to hypoalbuminemia Doubt heart failure but echocardiogram pending this admission  Painful umbilical hernia Hernia distended and discolored but soft and easily reduced. Patient does complain of discomfort with reduction; CT unremarkable and does not reveal any obstructive process or incarceration  Systolic murmur No prior echocardiogram so we will obtain this  admission  Microcytic anemia Iron 26 in February 2023 and in the past has been as low as 15 Repeat anemia panel  Tobacco use Cessation counseling initiated  Bladder wall thickening Patient without any urinary symptoms but as a precaution we will check UA and culture    Advance Care Planning:    Code Status: Full Code   Consults:  Interventional radiology for paracentesis  Family Communication:  Patient only; patient speaks some English but Patent attorney utilized to obtain history and physical  DVT prophylaxis:  SCDs only.  Patient's platelets are less than 100,000  Severity of Illness: The appropriate patient status for this patient is INPATIENT. Inpatient status is judged to be reasonable and necessary in order to provide the required intensity of service to ensure the patient's safety. The patient's presenting symptoms, physical exam findings, and initial radiographic and laboratory data in the context of their chronic comorbidities is felt to place them at high risk for further clinical deterioration. Furthermore, it is not anticipated that the patient will be medically stable for discharge from the hospital within 2 midnights of admission.   * I certify that at the point of admission it is my clinical judgment that the patient will require inpatient hospital care spanning beyond 2 midnights from the point of admission due to high intensity of service, high risk for further deterioration and high frequency of surveillance required.*  Author: Erin Hearing, NP 06/17/2021 9:40 AM  For on call review [email or web address].

## 2021-06-18 DIAGNOSIS — Z720 Tobacco use: Secondary | ICD-10-CM

## 2021-06-18 DIAGNOSIS — D61818 Other pancytopenia: Secondary | ICD-10-CM

## 2021-06-18 LAB — COMPREHENSIVE METABOLIC PANEL
ALT: 17 U/L (ref 0–44)
AST: 46 U/L — ABNORMAL HIGH (ref 15–41)
Albumin: 2 g/dL — ABNORMAL LOW (ref 3.5–5.0)
Alkaline Phosphatase: 79 U/L (ref 38–126)
Anion gap: 7 (ref 5–15)
BUN: <5 mg/dL — ABNORMAL LOW (ref 6–20)
CO2: 19 mmol/L — ABNORMAL LOW (ref 22–32)
Calcium: 7.7 mg/dL — ABNORMAL LOW (ref 8.9–10.3)
Chloride: 107 mmol/L (ref 98–111)
Creatinine, Ser: 0.51 mg/dL — ABNORMAL LOW (ref 0.61–1.24)
GFR, Estimated: >60 mL/min (ref >60)
Glucose, Bld: 81 mg/dL (ref 70–99)
Potassium: 3.7 mmol/L (ref 3.5–5.1)
Sodium: 133 mmol/L — ABNORMAL LOW (ref 135–145)
Total Bilirubin: 3.7 mg/dL — ABNORMAL HIGH (ref 0.3–1.2)
Total Protein: 5.4 g/dL — ABNORMAL LOW (ref 6.5–8.1)

## 2021-06-18 LAB — PROTIME-INR
INR: 2.2 — ABNORMAL HIGH (ref 0.8–1.2)
Prothrombin Time: 24.5 seconds — ABNORMAL HIGH (ref 11.4–15.2)

## 2021-06-18 LAB — IRON AND TIBC
Iron: 18 ug/dL — ABNORMAL LOW (ref 45–182)
Saturation Ratios: 10 % — ABNORMAL LOW (ref 17.9–39.5)
TIBC: 185 ug/dL — ABNORMAL LOW (ref 250–450)
UIBC: 167 ug/dL

## 2021-06-18 LAB — CBC
HCT: 26.7 % — ABNORMAL LOW (ref 39.0–52.0)
Hemoglobin: 8.9 g/dL — ABNORMAL LOW (ref 13.0–17.0)
MCH: 30.9 pg (ref 26.0–34.0)
MCHC: 33.3 g/dL (ref 30.0–36.0)
MCV: 92.7 fL (ref 80.0–100.0)
Platelets: 31 10*3/uL — ABNORMAL LOW (ref 150–400)
RBC: 2.88 MIL/uL — ABNORMAL LOW (ref 4.22–5.81)
RDW: 17.6 % — ABNORMAL HIGH (ref 11.5–15.5)
WBC: 1.3 10*3/uL — CL (ref 4.0–10.5)
nRBC: 0 % (ref 0.0–0.2)

## 2021-06-18 LAB — MAGNESIUM: Magnesium: 1.6 mg/dL — ABNORMAL LOW (ref 1.7–2.4)

## 2021-06-18 LAB — URINE CULTURE: Culture: <10000 — AB

## 2021-06-18 LAB — VITAMIN B12: Vitamin B-12: 806 pg/mL (ref 180–914)

## 2021-06-18 LAB — FERRITIN: Ferritin: 14 ng/mL — ABNORMAL LOW (ref 24–336)

## 2021-06-18 LAB — AMMONIA: Ammonia: 58 umol/L — ABNORMAL HIGH (ref 9–35)

## 2021-06-18 LAB — PATHOLOGIST SMEAR REVIEW

## 2021-06-18 LAB — APTT: aPTT: 41 seconds — ABNORMAL HIGH (ref 24–36)

## 2021-06-18 MED ORDER — ONDANSETRON HCL 4 MG/2ML IJ SOLN
4.0000 mg | Freq: Once | INTRAMUSCULAR | Status: AC
Start: 2021-06-18 — End: 2021-06-18
  Administered 2021-06-18: 4 mg via INTRAVENOUS
  Filled 2021-06-18: qty 2

## 2021-06-18 MED ORDER — FUROSEMIDE 40 MG PO TABS
40.0000 mg | ORAL_TABLET | Freq: Every day | ORAL | Status: DC
  Administered 2021-06-18 – 2021-06-22 (×5): 40 mg via ORAL
  Filled 2021-06-18 (×5): qty 1

## 2021-06-18 MED ORDER — LACTULOSE 10 GM/15ML PO SOLN
20.0000 g | Freq: Every day | ORAL | Status: DC
  Administered 2021-06-18: 20 g via ORAL
  Filled 2021-06-18: qty 30

## 2021-06-18 MED ORDER — MAGNESIUM SULFATE 4 GM/100ML IV SOLN
4.0000 g | Freq: Once | INTRAVENOUS | Status: AC
Start: 2021-06-18 — End: 2021-06-18
  Administered 2021-06-18: 4 g via INTRAVENOUS
  Filled 2021-06-18 (×2): qty 100

## 2021-06-18 MED ORDER — PANTOPRAZOLE SODIUM 40 MG PO TBEC
40.0000 mg | DELAYED_RELEASE_TABLET | Freq: Every day | ORAL | Status: DC
  Administered 2021-06-18 – 2021-06-22 (×5): 40 mg via ORAL
  Filled 2021-06-18 (×5): qty 1

## 2021-06-18 MED ORDER — SPIRONOLACTONE 100 MG PO TABS
100.0000 mg | ORAL_TABLET | Freq: Every day | ORAL | Status: DC
  Administered 2021-06-18 – 2021-06-22 (×5): 100 mg via ORAL
  Filled 2021-06-18 (×5): qty 1

## 2021-06-18 NOTE — Social Work (Signed)
CSW acknowledges consult for SU resources. Resources in Spanish added to AVS. TOC will sign off, please consult for any further needs.

## 2021-06-18 NOTE — Progress Notes (Addendum)
PROGRESS NOTE    Mathew [name]  [id number] DOB: March 03, 1980 DOA: 06/16/2021 PCP: [name], MD   Chief Complaint  Patient presents with   Abdominal Pain    Brief Narrative:  Patient is a 41 year old gentleman with a history of alcoholic cirrhosis with recurrent ascites, known umbilical hernia, ongoing alcohol use presenting to the ED with worsening abdominal swelling and pain.  It is noted that paracentesis was arranged in the outpatient setting however patient presented to the ED instead due to ongoing symptoms.  Patient with complaints of diffuse abdominal pain, noted to have umbilical hernia that was reducible and 2+ lower extremity edema.  Patient underwent paracentesis by IR with removal of approximately 5 L of IV fluid and placed empirically on IV antibiotics due to concern for possible SBP.  GI consulted for further evaluation and management of mildly decompensated alcoholic cirrhosis   Assessment & Plan:   Principal Problem:   Decompensated hepatic cirrhosis (HCC) Active Problems:   ETOH abuse   Microcytic anemia   Thrombocytopenia (HCC)   Alcoholic cirrhosis of liver with ascites (HCC)   Edema due to hypoalbuminemia   Umbilical hernia without obstruction and without gangrene   SBP (spontaneous bacterial peritonitis) ([place])  #1 acute abdominal pain secondary to massive ascites and concern for SBP -Patient presented with abdominal pain felt secondary to massive ascites contributing to his pain and also concern for SBP. -Patient underwent ultrasound-guided paracentesis by IR with 5.4 L of clear yellow fluid removed with no immediate complications. -Fluid sent for cultures and Gram stain currently pending. -Patient with some clinical improvement however still with some epigastric pain. -Patient with ongoing alcohol use. -Continue empiric IV Rocephin for now pending culture results. -Place on PPI -GI consultation pending. -Supportive care.  2.  Mildly  decompensated alcoholic cirrhosis -Patient presented massive ascites, abdominal distention and pain, total bilirubin noted to have increased to 3.6 from baseline of 2.3 on admission. -INR at 2.2 this morning. -Patient also noted with a pancytopenia. -??  Compliance with medications. -Place on Lasix 40 mg daily, spironolactone 100 mg daily, lactulose 20 mg daily. -Consult with GI for further evaluation and management.  3.  Grade 2 varices -Patient with no hematemesis or hematochezia. -Patient with alcoholic cirrhosis. -Placed on PPI.  4.  Painful umbilical hernia -Patient with some complaints of umbilical hernia noted to be distended but soft and easily reducible. -CT abdomen and pelvis done unremarkable with no incarceration or obstructive process noted. -Outpatient follow-up with general surgery.  5.  Systolic murmur -Noted per admitting physician. -2D echo done with EF of 55 to 60%, no wall motion abnormalities, normal right ventricular systolic function.  Severely dilated left atrial size. -Follow.  6.  Pancytopenia -Likely secondary to alcoholic cirrhosis. -Patient with no overt bleeding. -Anemia panel with iron level of 18, TIBC of 185, ferritin of 14, folate of 10.9.  Vitamin B12 levels of 806. -Follow H&H. -Transfusion threshold hemoglobin < 7.  7.  Tobacco abuse -Tobacco cessation.  8.  History of alcohol abuse -Alcohol cessation stressed to patient. -Continue the Ativan withdrawal protocol, thiamine, folic acid, multivitamin.  9.  Hypomagnesemia -Magnesium level of 1.9. -Likely secondary to ongoing alcohol abuse. -Magnesium sulfate 4 g IV x1. -Repeat labs in the AM.   DVT prophylaxis: SCDs Code Status: Full Family Communication: Updated patient and family at bedside via interpreter. Disposition: Home when clinically improved.  Status is: Inpatient Remains inpatient appropriate because: Severity of illness   Consultants:  GI  pending  Procedures:   Ultrasound-guided paracentesis per IR 06/17/2021 with 5.4 L of clear yellow fluid removed, [name], PA Chest x-ray 06/17/2021 CT abdomen and pelvis 06/16/2021 2D echo 06/17/2021     Antimicrobials:  IV Rocephin 06/17/2021 >>>>>    Subjective: Laying in bed.  Some improvement after paracentesis.  Patient with complaints of some epigastric abdominal pain.  Patient is some complaints of nausea.  No emesis.  Has not had a bowel movement in several days.  Family at bedside  Objective: Vitals:   06/18/21 0011 06/18/21 0500 06/18/21 0600 06/18/21 0819  BP: 135/75  131/76 124/74  Pulse: 89  83 81  Resp: 18  17 18   Temp: 99.5 F (37.5 C)  99.7 F (37.6 C) 98.9 F (37.2 C)  TempSrc: Oral  Oral Oral  SpO2: 98%  100% 98%  Weight:  79.2 kg    Height:        Intake/Output Summary (Last 24 hours) at 06/18/2021 1154 Last data filed at 06/18/2021 1100 Gross per 24 hour  Intake 340 ml  Output --  Net 340 ml   Filed Weights   06/17/21 1855 06/18/21 0500  Weight: 78.8 kg 79.2 kg    Examination:  General exam: Appears calm and comfortable  Respiratory system: Clear to auscultation. Respiratory effort normal. Cardiovascular system: S1 & S2 heard, RRR. No JVD, murmurs, rubs, gallops.  1+ bilateral lower extremity edema. Gastrointestinal system: Abdomen is nondistended, soft and some tenderness to palpation in the epigastric region.  Positive bowel sounds.  Reducible umbilical hernia.  No rebound.  No guarding.   Central nervous system: Alert and oriented. No focal neurological deficits. Extremities: Symmetric 5 x 5 power. Skin: No rashes, lesions or ulcers Psychiatry: Judgement and insight appear normal. Mood & affect appropriate.     Data Reviewed: I have personally reviewed following labs and imaging studies  CBC: Recent Labs  Lab 06/15/21 1457 06/16/21 1407 06/18/21 0723  WBC 1.8* 1.6* 1.3*  NEUTROABS 0.8* 0.8*  --   HGB 9.4* 10.1* 8.9*  HCT 28.4* 33.2* 26.7*  MCV 87 95.7  92.7  PLT 34* 39* 31*    Basic Metabolic Panel: Recent Labs  Lab 06/15/21 1457 06/16/21 1407 06/17/21 0931 06/18/21 0723  NA 136 138  --  133*  K 3.5 3.5  --  3.7  CL 106 112*  --  107  CO2 23 22  --  19*  GLUCOSE 120* 135*  --  81  BUN 5* <5*  --  <5*  CREATININE 0.54* 0.58*  --  0.51*  CALCIUM 7.9* 7.9*  --  7.7*  MG  --   --  1.7 1.6*  PHOS  --   --  3.6  --     GFR: Estimated Creatinine Clearance: 115.5 mL/min (A) (by C-G formula based on SCr of 0.51 mg/dL (L)).  Liver Function Tests: Recent Labs  Lab 06/15/21 1457 06/16/21 1407 06/18/21 0723  AST 78* 73* 46*  ALT 24 25 17   ALKPHOS 146* 112 79  BILITOT 2.8* 3.6* 3.7*  PROT 6.5 6.6 5.4*  ALBUMIN 2.8* 2.3* 2.0*    CBG: No results for input(s): "GLUCAP" in the last 168 hours.   Recent Results (from the past 240 hour(s))  Culture, body fluid w Gram Stain-bottle     Status: None (Preliminary result)   Collection Time: 06/17/21 12:06 PM   Specimen: Peritoneal Washings  Result Value Ref Range Status   Specimen Description PERITONEAL FLUID ABDOMEN  Final  Special Requests NONE  Final   Culture   Final    NO GROWTH < 12 HOURS Performed at [facility], [place] [address], [place], [place] [address]    Report Status PENDING  Incomplete  Gram stain     Status: None   Collection Time: 06/17/21 12:06 PM   Specimen: Peritoneal Washings  Result Value Ref Range Status   Specimen Description PERITONEAL FLUID ABDOMEN  Final   Special Requests NONE  Final   Gram Stain   Final    WBC PRESENT, PREDOMINANTLY MONONUCLEAR NO ORGANISMS SEEN CYTOSPIN SMEAR Performed at [facility], [place] [address], [place], [place] [address]    Report Status 06/17/2021 FINAL  Final         Radiology Studies: ECHOCARDIOGRAM COMPLETE  Result Date: 06/17/2021    ECHOCARDIOGRAM REPORT   Patient Name:   COZY MCQUADE CORDOBA Date of Exam: 06/17/2021 Medical Rec #:  [id number]            Height:       61.0 in Accession #:     [id number]           Weight:       188.8 lb Date of Birth:  1980/07/17            BSA:          1.843 m Patient Age:    46 years             BP:           123/76 mmHg Patient Gender: M                    HR:           60 bpm. Exam Location:  Inpatient Procedure: 2D Echo, Cardiac Doppler and Color Doppler Indications:    Murmur  History:        Patient has no prior history of Echocardiogram examinations.  Sonographer:    Jyl Heinz Referring Phys: [place]  1. Left ventricular ejection fraction, by estimation, is 55 to 60%. The left ventricle has normal function. The left ventricle has no regional wall motion abnormalities. Left ventricular diastolic parameters were normal.  2. Right ventricular systolic function is normal. The right ventricular size is normal. There is normal pulmonary artery systolic pressure.  3. Left atrial size was severely dilated.  4. The mitral valve is normal in structure. Trivial mitral valve regurgitation. No evidence of mitral stenosis.  5. The aortic valve is tricuspid. Aortic valve regurgitation is not visualized. No aortic stenosis is present.  6. The inferior vena cava is normal in size with <50% respiratory variability, suggesting right atrial pressure of 8 mmHg. FINDINGS  Left Ventricle: Left ventricular ejection fraction, by estimation, is 55 to 60%. The left ventricle has normal function. The left ventricle has no regional wall motion abnormalities. The left ventricular internal cavity size was normal in size. There is  no left ventricular hypertrophy. Left ventricular diastolic parameters were normal. Right Ventricle: The right ventricular size is normal. No increase in right ventricular wall thickness. Right ventricular systolic function is normal. There is normal pulmonary artery systolic pressure. The tricuspid regurgitant velocity is 2.54 m/s, and  with an assumed right atrial pressure of 8 mmHg, the estimated right ventricular systolic pressure is  123XX123 mmHg. Left Atrium: Left atrial size was severely dilated. Right Atrium: Right atrial size was normal in size. Pericardium: There is  no evidence of pericardial effusion. Mitral Valve: The mitral valve is normal in structure. Trivial mitral valve regurgitation. No evidence of mitral valve stenosis. Tricuspid Valve: The tricuspid valve is normal in structure. Tricuspid valve regurgitation is mild . No evidence of tricuspid stenosis. Aortic Valve: The aortic valve is tricuspid. Aortic valve regurgitation is not visualized. No aortic stenosis is present. Aortic valve mean gradient measures 8.0 mmHg. Aortic valve peak gradient measures 16.2 mmHg. Aortic valve area, by VTI measures 1.79  cm. Pulmonic Valve: The pulmonic valve was normal in structure. Pulmonic valve regurgitation is mild. No evidence of pulmonic stenosis. Aorta: The aortic root is normal in size and structure. Venous: The inferior vena cava is normal in size with less than 50% respiratory variability, suggesting right atrial pressure of 8 mmHg. IAS/Shunts: No atrial level shunt detected by color flow Doppler. Additional Comments: Mild ascites is present.  LEFT VENTRICLE PLAX 2D LVIDd:         5.60 cm      Diastology LVIDs:         3.20 cm      LV e' medial:    10.20 cm/s LV PW:         1.00 cm      LV E/e' medial:  7.8 LV IVS:        0.90 cm      LV e' lateral:   12.80 cm/s LVOT diam:     2.00 cm      LV E/e' lateral: 6.2 LV SV:         74 LV SV Index:   40 LVOT Area:     3.14 cm  LV Volumes (MOD) LV vol d, MOD A2C: 127.0 ml LV vol d, MOD A4C: 175.0 ml LV vol s, MOD A2C: 51.0 ml LV vol s, MOD A4C: 73.6 ml LV SV MOD A2C:     76.0 ml LV SV MOD A4C:     175.0 ml LV SV MOD BP:      89.1 ml RIGHT VENTRICLE             IVC RV Basal diam:  2.90 cm     IVC diam: 1.90 cm RV Mid diam:    3.30 cm RV S prime:     14.60 cm/s TAPSE (M-mode): 2.5 cm LEFT ATRIUM             Index        RIGHT ATRIUM           Index LA diam:        4.40 cm 2.39 cm/m   RA Area:      14.50 cm LA Vol (A2C):   92.9 ml 50.41 ml/m  RA Volume:   30.80 ml  16.71 ml/m LA Vol (A4C):   93.1 ml 50.51 ml/m LA Biplane Vol: 94.4 ml 51.22 ml/m  AORTIC VALVE AV Area (Vmax):    1.80 cm AV Area (Vmean):   2.03 cm AV Area (VTI):     1.79 cm AV Vmax:           201.00 cm/s AV Vmean:          134.000 cm/s AV VTI:            0.411 m AV Peak Grad:      16.2 mmHg AV Mean Grad:      8.0 mmHg LVOT Vmax:         115.00 cm/s LVOT Vmean:  86.500 cm/s LVOT VTI:          0.234 m LVOT/AV VTI ratio: 0.57  AORTA Ao Root diam: 3.30 cm Ao Asc diam:  2.90 cm MITRAL VALVE               TRICUSPID VALVE MV Area (PHT): 3.46 cm    TR Peak grad:   25.8 mmHg MV Decel Time: 219 msec    TR Vmax:        254.00 cm/s MV E velocity: 79.90 cm/s MV A velocity: 37.20 cm/s  SHUNTS MV E/A ratio:  2.15        Systemic VTI:  0.23 m                            Systemic Diam: 2.00 cm Skeet Latch MD Electronically signed by Skeet Latch MD Signature Date/Time: 06/17/2021/4:23:10 PM    Final    IR Paracentesis  Result Date: 06/17/2021 INDICATION: Ascites secondary to alcoholic cirrhosis. Request for diagnostic and therapeutic paracentesis. EXAM: ULTRASOUND GUIDED PARACENTESIS MEDICATIONS: 1% lidocaine 10 mL COMPLICATIONS: None immediate. PROCEDURE: Informed written consent was obtained from the patient after a discussion of the risks, benefits and alternatives to treatment. A timeout was performed prior to the initiation of the procedure. Initial ultrasound scanning demonstrates a large amount of ascites within the right lower abdominal quadrant. The right lower abdomen was prepped and draped in the usual sterile fashion. 1% lidocaine was used for local anesthesia. Following this, a 19 gauge, 7-cm, Yueh catheter was introduced. An ultrasound image was saved for documentation purposes. The paracentesis was performed. The catheter was removed and a dressing was applied. The patient tolerated the procedure well without immediate post  procedural complication. Patient received post-procedure intravenous albumin; see nursing notes for details. FINDINGS: A total of approximately 5.4 L of clear yellow fluid was removed. Samples were sent to the laboratory as requested by the clinical team. IMPRESSION: Successful ultrasound-guided paracentesis yielding 5.4 liters of peritoneal fluid. PLAN: If the patient eventually requires >/=2 paracenteses in a 30 day period, candidacy for formal evaluation by the [facility] will be assessed. Procedure performed by: [name], PA-C Electronically Signed   By: Lucrezia Europe M.D.   On: 06/17/2021 14:55   DG Chest Portable 1 View  Result Date: 06/17/2021 CLINICAL DATA:  Shortness of breath EXAM: PORTABLE CHEST 1 VIEW COMPARISON:  02/23/2021 FINDINGS: Cardiac shadow is stable. The overall inspiratory effort is poor. Left basilar atelectasis is noted. No bony abnormality is seen. IMPRESSION: Poor inspiratory effort with left basilar atelectasis. Electronically Signed   By: Inez Catalina M.D.   On: 06/17/2021 02:45   CT Abdomen Pelvis W Contrast  Result Date: 06/16/2021 CLINICAL DATA:  Abdominal pain. EXAM: CT ABDOMEN AND PELVIS WITH CONTRAST TECHNIQUE: Multidetector CT imaging of the abdomen and pelvis was performed using the standard protocol following bolus administration of intravenous contrast. RADIATION DOSE REDUCTION: This exam was performed according to the departmental dose-optimization program which includes automated exposure control, adjustment of the mA and/or kV according to patient size and/or use of iterative reconstruction technique. CONTRAST:  124mL OMNIPAQUE IOHEXOL 300 MG/ML  SOLN COMPARISON:  CT abdomen and pelvis 02/23/2021 FINDINGS: Lower chest: Respiratory motion artifact with mild atelectasis in the lung bases. New small left pleural effusion. Borderline cardiomegaly. Hepatobiliary: Cirrhosis. Small calcified stones in the gallbladder. No  abnormal gallbladder distension. No biliary dilatation. Pancreas: Normal parenchymal enhancement.  No  ductal dilatation. Spleen: Mild splenomegaly. Adrenals/Urinary Tract: Unremarkable adrenal glands. No evidence of a renal mass, calculi, or hydronephrosis. Moderate circumferential bladder wall thickening. Stomach/Bowel: The stomach is unremarkable. There is wall thickening of the duodenum and multiple loops of proximal jejunum. There is wall thickening of the cecum, ascending colon, and possibly transverse colon although the appearance may be partially due to under distension. There is no evidence of bowel obstruction. The appendix is unremarkable. Vascular/Lymphatic: Normal caliber of the abdominal aorta. Patent main portal vein and splenic vein. No enlarged lymph nodes. Reproductive: Unremarkable prostate. Other: Moderately large volume ascites, increased from prior. No loculated fluid collection or pneumoperitoneum. Diffuse haziness/edema throughout the mesenteric fat. Small paraumbilical hernia containing fluid. Musculoskeletal: No acute osseous abnormality or suspicious osseous lesion. IMPRESSION: 1. Cirrhosis and mild splenomegaly with increased, moderately large volume ascites. 2. Wall thickening of the duodenum, proximal jejunum, and right colon which may reflect portal enterocolopathy although enteritis is not excluded. 3. Bladder wall thickening.  Correlate for cystitis. 4. New small left pleural effusion. 5. Cholelithiasis. Electronically Signed   By: Logan Bores M.D.   On: 06/16/2021 15:51        Scheduled Meds:  folic acid  1 mg Oral Daily   furosemide  40 mg Intravenous Once   furosemide  40 mg Oral Daily   lactulose  20 g Oral Daily   LORazepam  0-4 mg Oral Q6H   Followed by   Derrill Memo ON 06/19/2021] LORazepam  0-4 mg Oral Q12H   multivitamin with minerals  1 tablet Oral Daily   pantoprazole  40 mg Oral Q0600   sodium chloride flush  3 mL Intravenous Q12H   spironolactone  100 mg Oral  Daily   thiamine  100 mg Oral Daily   Continuous Infusions:  sodium chloride     cefTRIAXone (ROCEPHIN)  IV 2 g (06/18/21 0847)   magnesium sulfate bolus IVPB       LOS: 1 day    Time spent: 45 minutes    Irine Seal, MD [facility]   To contact the attending provider between 7A-7P or the covering provider during after hours 7P-7A, please log into the web site [email or web address] and access using universal [place] password for that web site. If you do not have the password, please call the hospital operator.  06/18/2021, 11:54 AM

## 2021-06-18 NOTE — Consult Note (Signed)
Referring Provider: Eugenie Filler, MD Primary Care Physician:  [name], MD Primary Gastroenterologist: Has seen [facility] (not an active patient)  Reason for Consultation:  Decompensated alcoholic cirrhosis  HPI: Mathew [name] is a 41 y.o. male with medical history significant of alcohol-related cirrhosis, recurrent ascites, nonadherence to medical therapy and umbilical hernia.  In regards to the hernia patient has been evaluated by surgery but unless incarcerated and needs urgent surgery he is not an elective surgical candidate.  Patient follows with the [facility] by Dr. Joya Gaskins.  He has been seen recently in the office (06/15/2021) and plans were made to proceed with outpatient paracentesis. Patient has developed increasing abdominal pain and was sent to the ER for evaluation.   In the ER he was noted to have significant abdominal discomfort, massive ascites, total bilirubin up to 3.6 which is higher than baseline of 2.3.  Hospitalist service was asked to admit this patient for possible SBP and therapeutic/diagnostic paracentesis.  Patient is s/p IR paracentesis with 5.4 L of fluid removed, lab report pending.  Outpatient medications include thiamine, iron, folic acid, Lasix, spironolactone, Protonix.  He has been started on Rocephin and lactulose this admission, on CIWA protocol.   Patient resting in bed with family at bedside at time of my visit.  He and family speak Spanish and require interpretation services.  Interpreters: Cory Roughen [phone] and Kalman Shan [phone]  Patient reports diffuse abdominal pain in the last several weeks which worsened and was associated with abdominal swelling, prompting him to come to the hospital. Patient denies nausea, vomiting, fever, chills, hematochezia, hematemesis, melena.  States he has a bowel movement once a day which is normal in color.  Denies any dysuria or hematuria and states urine has not been  dark.  Patient reports that he does continue to drink alcohol though he has cut back since being diagnosed with cirrhosis a couple years ago.  Reports he may have a couple beers on a given day but does not drink daily.  Last alcoholic beverage was on Sunday, 06/13/2021.  Patient smokes a cigarette every 3 to 4 days.  Denies drug use, denies NSAIDs or Tylenol. Denies outpatient medication changes.  Denies family history of GI malignancy or disease.  Denies history of MI/stroke.  Is not on any blood thinners.  He does report that he has attempted to cut back on sodium in his diet.  Patient had EGD/colon 03/17/2018 with Dr. Cristina Gong inpatient, with findings of mild portal gastropathy and grade 2 varices on upper endoscopy, friable mucosa on colonoscopy with 10-year recall recommended.  Patient was seen by Dr. Hubbard Hartshorn as an outpatient 09/2019, was then lost to follow-up.  Patient reports that he will sometimes forget to take his medications at home.  Has not seen since 09/2019.  Past Medical History:  Diagnosis Date   Alcohol withdrawal seizure ([place])    Cirrhosis ([place])    ETOH abuse    Pneumonia 04/02/2020    Past Surgical History:  Procedure Laterality Date   COLONOSCOPY WITH PROPOFOL N/A 03/17/2018   Procedure: COLONOSCOPY WITH PROPOFOL;  Surgeon: Ronald Lobo, MD;  Location: [facility];  Service: Endoscopy;  Laterality: N/A;   ESOPHAGOGASTRODUODENOSCOPY (EGD) WITH PROPOFOL N/A 03/17/2018   Procedure: ESOPHAGOGASTRODUODENOSCOPY (EGD) WITH PROPOFOL;  Surgeon: Ronald Lobo, MD;  Location: [facility];  Service: Endoscopy;  Laterality: N/A;   IR PARACENTESIS  03/14/2018   IR PARACENTESIS  11/18/2020   IR PARACENTESIS  06/17/2021  Prior to Admission medications   Medication Sig Start Date End Date Taking? Authorizing Provider  thiamine 100 MG tablet Take 1 tablet (100 mg total) by mouth daily. 06/15/21  Yes [name], MD  Vitamin D, Ergocalciferol, (DRISDOL) 1.25 MG (50000 UNIT) CAPS  capsule Take 1 capsule (50,000 Units total) by mouth every 7 (seven) days (Tuesdays) Patient taking differently: Take 50,000 Units by mouth every Wednesday. 06/15/21  Yes [name], MD  famotidine (PEPCID) 20 MG tablet Take 1 tablet (20 mg total) by mouth 2 (two) times daily. 06/15/21   [name], MD  ferrous sulfate 325 (65 FE) MG tablet Take 1 tablet (325 mg total) by mouth 3 (three) times daily with meals. 06/15/21   [name], MD  folic acid (FOLVITE) 1 MG tablet Take 1 tablet (1 mg total) by mouth daily. 06/15/21 06/15/22  [name], MD  furosemide (LASIX) 40 MG tablet Take 1 tablet (40 mg total) by mouth 2 (two) times daily. 06/15/21   [name], MD  spironolactone (ALDACTONE) 100 MG tablet Take 1 tablet (100 mg total) by mouth 2 (two) times daily. 06/15/21   [name], MD  pantoprazole (PROTONIX) 40 MG tablet Take 1 tablet (40 mg total) by mouth daily. Patient not taking: Reported on 04/03/2020 03/31/20 04/03/20  Hoy Register, MD    Scheduled Meds:  folic acid  1 mg Oral Daily   furosemide  40 mg Intravenous Once   furosemide  40 mg Oral Daily   lactulose  20 g Oral Daily   LORazepam  0-4 mg Oral Q6H   Followed by   Melene Muller ON 06/19/2021] LORazepam  0-4 mg Oral Q12H   multivitamin with minerals  1 tablet Oral Daily   ondansetron (ZOFRAN) IV  4 mg Intravenous Once   pantoprazole  40 mg Oral Q0600   sodium chloride flush  3 mL Intravenous Q12H   spironolactone  100 mg Oral Daily   thiamine  100 mg Oral Daily   Continuous Infusions:  sodium chloride     cefTRIAXone (ROCEPHIN)  IV 2 g (06/18/21 0847)   magnesium sulfate bolus IVPB     PRN Meds:.sodium chloride, acetaminophen **OR** acetaminophen, lidocaine (PF), LORazepam **OR** LORazepam, morphine injection, ondansetron **OR** ondansetron (ZOFRAN) IV, oxyCODONE, sodium chloride flush  Allergies as of 06/16/2021   (No Known Allergies)    Family History  Problem Relation Age of Onset   Cancer  Neg Hx    Heart disease Neg Hx     Social History   Socioeconomic History   Marital status: Significant Other    Spouse name: Not on file   Number of children: Not on file   Years of education: Not on file   Highest education level: Not on file  Occupational History   Not on file  Tobacco Use   Smoking status: Every Day    Packs/day: 0.25    Types: Cigarettes    Start date: 08/11/2019   Smokeless tobacco: Never  Vaping Use   Vaping Use: Never used  Substance and Sexual Activity   Alcohol use: Yes    Alcohol/week: 2.0 standard drinks of alcohol    Types: 2 Cans of beer per week    Comment: 2 bottles of beer everyday   Drug use: No   Sexual activity: Not Currently  Other Topics Concern   Not on file  Social History Narrative   Not on file   Social Determinants of Health  Financial Resource Strain: Not on file  Food Insecurity: Not on file  Transportation Needs: Not on file  Physical Activity: Not on file  Stress: Not on file  Social Connections: Not on file  Intimate Partner Violence: Not on file    Review of Systems: Review of Systems  Constitutional:  Negative for chills and fever.  HENT:  Negative for sore throat.   Eyes:  Negative for discharge and redness.  Respiratory:  Negative for hemoptysis, shortness of breath, wheezing and stridor.   Cardiovascular:  Negative for chest pain and palpitations.  Gastrointestinal:  Positive for abdominal pain. Negative for blood in stool, constipation, diarrhea, heartburn, melena, nausea and vomiting.  Genitourinary:  Negative for dysuria, flank pain and urgency.  Musculoskeletal:  Negative for falls and joint pain.  Skin:  Negative for itching and rash.  Neurological:  Negative for seizures and loss of consciousness.  Endo/Heme/Allergies:  Negative for environmental allergies and polydipsia.  Psychiatric/Behavioral:  Negative for memory loss. The patient does not have insomnia.      Physical Exam: Physical  Exam Constitutional:      General: He is not in acute distress. HENT:     Head: Normocephalic and atraumatic.     Right Ear: External ear normal.     Left Ear: External ear normal.     Nose: Nose normal.     Mouth/Throat:     Mouth: Mucous membranes are moist.     Pharynx: Oropharynx is clear.  Eyes:     General: Scleral icterus present.     Extraocular Movements: Extraocular movements intact.  Cardiovascular:     Rate and Rhythm: Normal rate and regular rhythm.     Pulses: Normal pulses.     Heart sounds: Normal heart sounds.  Pulmonary:     Effort: Pulmonary effort is normal.     Breath sounds: Normal breath sounds.  Abdominal:     General: There is distension.     Tenderness: There is abdominal tenderness. There is no guarding.     Comments: Tympany to percussion, bowel sounds present  Musculoskeletal:     Cervical back: Normal range of motion and neck supple.     Right lower leg: Edema present.     Left lower leg: Edema present.  Skin:    General: Skin is warm and dry.  Neurological:     General: No focal deficit present.     Mental Status: He is alert and oriented to person, place, and time.  Psychiatric:        Mood and Affect: Mood normal.        Behavior: Behavior normal.     Vital signs: Vitals:   06/18/21 0600 06/18/21 0819  BP: 131/76 124/74  Pulse: 83 81  Resp: 17 18  Temp: 99.7 F (37.6 C) 98.9 F (37.2 C)  SpO2: 100% 98%   Last BM Date : 06/14/21    GI:  Lab Results: Recent Labs    06/15/21 1457 06/16/21 1407 06/18/21 0723  WBC 1.8* 1.6* 1.3*  HGB 9.4* 10.1* 8.9*  HCT 28.4* 33.2* 26.7*  PLT 34* 39* 31*   BMET Recent Labs    06/15/21 1457 06/16/21 1407 06/18/21 0723  NA 136 138 133*  K 3.5 3.5 3.7  CL 106 112* 107  CO2 23 22 19*  GLUCOSE 120* 135* 81  BUN 5* <5* <5*  CREATININE 0.54* 0.58* 0.51*  CALCIUM 7.9* 7.9* 7.7*   LFT Recent Labs    06/18/21 2426  PROT 5.4*  ALBUMIN 2.0*  AST 46*  ALT 17  ALKPHOS 79  BILITOT  3.7*   PT/INR Recent Labs    06/16/21 1407 06/18/21 0723  LABPROT 21.1* 24.5*  INR 1.8* 2.2*     Studies/Results: ECHOCARDIOGRAM COMPLETE  Result Date: 06/17/2021    ECHOCARDIOGRAM REPORT   Patient Name:   ELIUS ETHEREDGE CORDOBA Date of Exam: 06/17/2021 Medical Rec #:  [id number]            Height:       61.0 in Accession #:    [id number]           Weight:       188.8 lb Date of Birth:  [birth year]            BSA:          1.843 m Patient Age:    76 years             BP:           123/76 mmHg Patient Gender: M                    HR:           60 bpm. Exam Location:  Inpatient Procedure: 2D Echo, Cardiac Doppler and Color Doppler Indications:    Murmur  History:        Patient has no prior history of Echocardiogram examinations.  Sonographer:    Jyl Deondrae Mcgrail Referring Phys: [place]  1. Left ventricular ejection fraction, by estimation, is 55 to 60%. The left ventricle has normal function. The left ventricle has no regional wall motion abnormalities. Left ventricular diastolic parameters were normal.  2. Right ventricular systolic function is normal. The right ventricular size is normal. There is normal pulmonary artery systolic pressure.  3. Left atrial size was severely dilated.  4. The mitral valve is normal in structure. Trivial mitral valve regurgitation. No evidence of mitral stenosis.  5. The aortic valve is tricuspid. Aortic valve regurgitation is not visualized. No aortic stenosis is present.  6. The inferior vena cava is normal in size with <50% respiratory variability, suggesting right atrial pressure of 8 mmHg. FINDINGS  Left Ventricle: Left ventricular ejection fraction, by estimation, is 55 to 60%. The left ventricle has normal function. The left ventricle has no regional wall motion abnormalities. The left ventricular internal cavity size was normal in size. There is  no left ventricular hypertrophy. Left ventricular diastolic parameters were normal. Right Ventricle: The  right ventricular size is normal. No increase in right ventricular wall thickness. Right ventricular systolic function is normal. There is normal pulmonary artery systolic pressure. The tricuspid regurgitant velocity is 2.54 m/s, and  with an assumed right atrial pressure of 8 mmHg, the estimated right ventricular systolic pressure is 74.2 mmHg. Left Atrium: Left atrial size was severely dilated. Right Atrium: Right atrial size was normal in size. Pericardium: There is no evidence of pericardial effusion. Mitral Valve: The mitral valve is normal in structure. Trivial mitral valve regurgitation. No evidence of mitral valve stenosis. Tricuspid Valve: The tricuspid valve is normal in structure. Tricuspid valve regurgitation is mild . No evidence of tricuspid stenosis. Aortic Valve: The aortic valve is tricuspid. Aortic valve regurgitation is not visualized. No aortic stenosis is present. Aortic valve mean gradient measures 8.0 mmHg. Aortic valve peak gradient measures 16.2 mmHg. Aortic valve area, by VTI measures 1.79  cm. Pulmonic Valve: The pulmonic valve was normal  in structure. Pulmonic valve regurgitation is mild. No evidence of pulmonic stenosis. Aorta: The aortic root is normal in size and structure. Venous: The inferior vena cava is normal in size with less than 50% respiratory variability, suggesting right atrial pressure of 8 mmHg. IAS/Shunts: No atrial level shunt detected by color flow Doppler. Additional Comments: Mild ascites is present.  LEFT VENTRICLE PLAX 2D LVIDd:         5.60 cm      Diastology LVIDs:         3.20 cm      LV e' medial:    10.20 cm/s LV PW:         1.00 cm      LV E/e' medial:  7.8 LV IVS:        0.90 cm      LV e' lateral:   12.80 cm/s LVOT diam:     2.00 cm      LV E/e' lateral: 6.2 LV SV:         74 LV SV Index:   40 LVOT Area:     3.14 cm  LV Volumes (MOD) LV vol d, MOD A2C: 127.0 ml LV vol d, MOD A4C: 175.0 ml LV vol s, MOD A2C: 51.0 ml LV vol s, MOD A4C: 73.6 ml LV SV MOD A2C:      76.0 ml LV SV MOD A4C:     175.0 ml LV SV MOD BP:      89.1 ml RIGHT VENTRICLE             IVC RV Basal diam:  2.90 cm     IVC diam: 1.90 cm RV Mid diam:    3.30 cm RV S prime:     14.60 cm/s TAPSE (M-mode): 2.5 cm LEFT ATRIUM             Index        RIGHT ATRIUM           Index LA diam:        4.40 cm 2.39 cm/m   RA Area:     14.50 cm LA Vol (A2C):   92.9 ml 50.41 ml/m  RA Volume:   30.80 ml  16.71 ml/m LA Vol (A4C):   93.1 ml 50.51 ml/m LA Biplane Vol: 94.4 ml 51.22 ml/m  AORTIC VALVE AV Area (Vmax):    1.80 cm AV Area (Vmean):   2.03 cm AV Area (VTI):     1.79 cm AV Vmax:           201.00 cm/s AV Vmean:          134.000 cm/s AV VTI:            0.411 m AV Peak Grad:      16.2 mmHg AV Mean Grad:      8.0 mmHg LVOT Vmax:         115.00 cm/s LVOT Vmean:        86.500 cm/s LVOT VTI:          0.234 m LVOT/AV VTI ratio: 0.57  AORTA Ao Root diam: 3.30 cm Ao Asc diam:  2.90 cm MITRAL VALVE               TRICUSPID VALVE MV Area (PHT): 3.46 cm    TR Peak grad:   25.8 mmHg MV Decel Time: 219 msec    TR Vmax:        254.00 cm/s MV E velocity: 79.90 cm/s MV  A velocity: 37.20 cm/s  SHUNTS MV E/A ratio:  2.15        Systemic VTI:  0.23 m                            Systemic Diam: 2.00 cm Skeet Latch MD Electronically signed by Skeet Latch MD Signature Date/Time: 06/17/2021/4:23:10 PM    Final    IR Paracentesis  Result Date: 06/17/2021 INDICATION: Ascites secondary to alcoholic cirrhosis. Request for diagnostic and therapeutic paracentesis. EXAM: ULTRASOUND GUIDED PARACENTESIS MEDICATIONS: 1% lidocaine 10 mL COMPLICATIONS: None immediate. PROCEDURE: Informed written consent was obtained from the patient after a discussion of the risks, benefits and alternatives to treatment. A timeout was performed prior to the initiation of the procedure. Initial ultrasound scanning demonstrates a large amount of ascites within the right lower abdominal quadrant. The right lower abdomen was prepped and draped in the  usual sterile fashion. 1% lidocaine was used for local anesthesia. Following this, a 19 gauge, 7-cm, Yueh catheter was introduced. An ultrasound image was saved for documentation purposes. The paracentesis was performed. The catheter was removed and a dressing was applied. The patient tolerated the procedure well without immediate post procedural complication. Patient received post-procedure intravenous albumin; see nursing notes for details. FINDINGS: A total of approximately 5.4 L of clear yellow fluid was removed. Samples were sent to the laboratory as requested by the clinical team. IMPRESSION: Successful ultrasound-guided paracentesis yielding 5.4 liters of peritoneal fluid. PLAN: If the patient eventually requires >/=2 paracenteses in a 30 day period, candidacy for formal evaluation by the [facility] will be assessed. Procedure performed by: [name], PA-C Electronically Signed   By: Lucrezia Europe M.D.   On: 06/17/2021 14:55   DG Chest Portable 1 View  Result Date: 06/17/2021 CLINICAL DATA:  Shortness of breath EXAM: PORTABLE CHEST 1 VIEW COMPARISON:  02/23/2021 FINDINGS: Cardiac shadow is stable. The overall inspiratory effort is poor. Left basilar atelectasis is noted. No bony abnormality is seen. IMPRESSION: Poor inspiratory effort with left basilar atelectasis. Electronically Signed   By: Inez Catalina M.D.   On: 06/17/2021 02:45   CT Abdomen Pelvis W Contrast  Result Date: 06/16/2021 CLINICAL DATA:  Abdominal pain. EXAM: CT ABDOMEN AND PELVIS WITH CONTRAST TECHNIQUE: Multidetector CT imaging of the abdomen and pelvis was performed using the standard protocol following bolus administration of intravenous contrast. RADIATION DOSE REDUCTION: This exam was performed according to the departmental dose-optimization program which includes automated exposure control, adjustment of the mA and/or kV according to patient size and/or use of iterative  reconstruction technique. CONTRAST:  11mL OMNIPAQUE IOHEXOL 300 MG/ML  SOLN COMPARISON:  CT abdomen and pelvis 02/23/2021 FINDINGS: Lower chest: Respiratory motion artifact with mild atelectasis in the lung bases. New small left pleural effusion. Borderline cardiomegaly. Hepatobiliary: Cirrhosis. Small calcified stones in the gallbladder. No abnormal gallbladder distension. No biliary dilatation. Pancreas: Normal parenchymal enhancement.  No ductal dilatation. Spleen: Mild splenomegaly. Adrenals/Urinary Tract: Unremarkable adrenal glands. No evidence of a renal mass, calculi, or hydronephrosis. Moderate circumferential bladder wall thickening. Stomach/Bowel: The stomach is unremarkable. There is wall thickening of the duodenum and multiple loops of proximal jejunum. There is wall thickening of the cecum, ascending colon, and possibly transverse colon although the appearance may be partially due to under distension. There is no evidence of bowel obstruction. The appendix is unremarkable. Vascular/Lymphatic: Normal caliber of the abdominal aorta. Patent main portal vein and splenic vein.  No enlarged lymph nodes. Reproductive: Unremarkable prostate. Other: Moderately large volume ascites, increased from prior. No loculated fluid collection or pneumoperitoneum. Diffuse haziness/edema throughout the mesenteric fat. Small paraumbilical hernia containing fluid. Musculoskeletal: No acute osseous abnormality or suspicious osseous lesion. IMPRESSION: 1. Cirrhosis and mild splenomegaly with increased, moderately large volume ascites. 2. Wall thickening of the duodenum, proximal jejunum, and right colon which may reflect portal enterocolopathy although enteritis is not excluded. 3. Bladder wall thickening.  Correlate for cystitis. 4. New small left pleural effusion. 5. Cholelithiasis. Electronically Signed   By: Logan Bores M.D.   On: 06/16/2021 15:51    Impression: Decompensated alcoholic cirrhosis with ascites - History  of recurrent ascites and noncompliance with medical therapy - S/p IR paracentesis and 5.4 L removed, fluid analysis revealed monocytes 46, albumin <1.5, amylase 13, neutrophil count 1 - AST 46, ALT 17, alk phos 79, T. bili 3.7 (baseline 2.3) - Ammonia level 58 - PT 24.5, INR 2.2 - MELD-Na score 23  - Maddrey's discriminant function: 61.2 (poor prognosis)  -CT abdomen/pelvis 06/16/2021 shows cirrhosis and mild splenomegaly with increased, moderately large volume ascites.  Wall thickening of the duodenum, proximal jejunum, and right colon which may reflect portal and or colopathy although enteritis is not excluded.  Bladder wall thickening, correlate for cystitis.  New small left pleural effusion.  Cholelithiasis.  Anemia, thrombocytopenia - Hemoglobin 8.9 (9.4 on admission), WBC 1.3 normal MCV, platelets 31 - Iron 18, ferritin 14, folate 10.9, B12 806 - BUN <5, creatinine 0.51, GFR>60  Plan: Patient with decompensated cirrhosis and ascites. LFTs stable. Maddrey's discriminant function indicates poor prognosis. Will discuss further management with Dr. Watt Climes.  Continue Lactulose, Lasix, Protonix, Spironolactone, Rocephin.  Continue supportive care. Continue to trend LFTs.  Continue daily CBC and transfuse as needed to maintain HGB > 7  [facility] will follow.    LOS: 1 day   Angelique Holm  PA-C 06/18/2021, 12:06 PM  Contact #  [phone]

## 2021-06-18 NOTE — Discharge Instructions (Signed)
[employer] de La Alianza de SAMHSA La [employer] de Ayuda de SAMHSA es un servicio gratuito, confidencial, [employer] 24 horas, los 7 [address] de la [place], los [address]. Esta lnea telefnica es un servicio de informacin (en ingls y espaol) para personas y familias que enfrentan trastornos mentales o de uso de sustancias. Llama: [phone] Este servicio es gratuito. Si no tiene seguro mdico o tiene un servicio insuficiente, lo referiremos a Social research officer, government en su estado que sea responsable de los programas de tratamiento de salud mental financiados por el gobierno local. En muchas ocasiones podemos remitirlo a instalaciones que cobran en una escala de tarifas variable o aceptan Medicare o Medicaid. Si tiene KB Home	[place], le recomendamos que se comunique con su aseguradora para obtener una lista de los proveedores y centros mdicos participantes RECURSOS RECURSOS PARA CLIENTES - [place] ABUSO DE SUSTANCIAS Y ALCOHOL CONSULTE EL SITIO WEB DE SAMHSA PARA  -Abuso de Sustancias Habla Hispana SERVICIOS DWI ESPAOL -Cambios de vida con Burney Gauze [phone]   SERVICIOS DWI  Asesoramiento y evaluacin de ACDM [address], [place], [phone]     Al-Con [facility] [address], [place], [phone]   [facility] de [place] [address], [place], [phone]  [facility] [address] [place], [place], [phone]  Bronson Ing Alcohol y Drogas [address] [place], [phone]  Recursos conductuales de la trada [address], [place], [phone]   ABUSO DE SUSTANCIAS PACIENTES HOSPITALIZADOS PARA INDIVIDUOS SIN SEGURO (TODOS DEBEN LLEVAR A LOS RESIDENTES DEL CONDADO DE [place])   DETOX [facility] - Centro de evaluacin del comportamiento Evaluacin confidencial, derivacin a desintoxicacin disponible 24/7 [address], [place], [place] [address], [phone]  Berkley Harvey Residencial DETOX de [address] [facility]) [address] [place], [phone]  DETOX [facility] - Asociacin de Cuidado de Recuperacin de [place]) [address] [place], [place], [phone]   Da de DETOX [facility]) [address], [place], [phone]   Centro de recuperacin de crisis DETOX [place]) [address], [place], [phone] [facility] SOBRE LOS RESIDENTES DEL CONDADO DE [place]   Centro de recuperacin de crisis DETOX (Ninfa Meeker) [address] [employer], [place], [phone] [employer] SOBRE LOS RESIDENTES DEL CONDADO DE [place]  Servicios de Tratamiento Residencial de [place] ([place]) Tratamiento a largo plazo SOLO PARA HOMBRES. Pilgrim's Pride meses a un ao [address] [place], [phone]  Servicios de recuperacin de Republic diurnas [facility] / [place]) Tratamiento hospitalario Vernell Leep [address] o ms [address] Rush Barer alto [phone]  Camino de la esperanza [place]) Tratamiento hospitalario HOMBRES Watt Climes Programa de [address] [place], [place], [phone]   [facility] - Asociacin de Atencin para la Recuperacin de Adicciones [place]) Tratamiento hospitalario HOMBRES Y Beacher May Programa de [address] [place], [place], [phone] Servicios de cuidado (Punto alto) Tratamiento a largo plazo Vernell Leep [address] o ms [address] punto [place], [phone]   TRATAMIENTO POR ABUSO DE SUSTANCIAS CON HONORARIOS O SEGURO  Centro de [place] ([place]ilot Baxter / [place]) Tratamiento para pacientes hospitalizados HOMBRES / Estill Cotta de [address] / atencin a largo plazo [address] Salomon Mast [phone]  Saln de becas [place]) [place] de desintoxicacin y hospitalizacin [place] / MUJERES [address] / atencin a [employer] [address]  [place], [phone] / [phone]  Viejo viedo [place]) Detox y [place] / DIAGNOSTICO DUAL [address], [place], [phone]  Centro de [facility] [place] HOMBRES Y MUJERES AMBULATORIOS [address] [place], [place], [phone]  Desafo adolescente Gerlean Ren residencial de 7 meses con marco cristiano     REDUCCIN DE DAOS GCSTOP - Solucin del condado de [place] al problema de los opiceos [place] [phone] [email or web address]  -Agujas limpias, prueba de fentanilo, manejo de [place], acceso a [place], [place], desintoxicacin y [place], acceso a una enfermera registrada  Sindicato de sobrevivientes urbanos [phone] -Agujas limpias, acceso a narcan, prueba de [facility] MENTAL Alternativas Teraputicas (GRATIS) Crisis mvil [phone]  Family Services of the [place] [phone] (escala mvil) [address] la [place], [place] -[place], Depresin, PTSD, Agresin Sexual, Violencia Domstica Lnea de crisis las 24 horas Violencia domstica/violacin [phone]   [place] [phone] (tarifa reducida o gratis) [address], [facility] Mental, Trauma, Jodi Marble Lazy Y U [phone] / [phone], [place]  -Llamar para programar los grupos de lunes por la noche  -Renaldo Fiddler, Ansiedad y Depresin [facility] familiar y colaboracin comunitaria [phone] Defensor de apoyo familiar, [place] Ouch, [phone] -O llame al [phone] disponible las 24 horas del da   Shopiere [phone] (escala variable y [place] seguros) [address], [place] -Medicacin, Depresin, [place] Santuario [phone] [address] March ARB -Debe tener una enfermedad mental grave para recibir servicios -Proporciona apoyo y red para individuos.   Consejera  Cristiana [place] [phone] Daisy Blossom, [address], [address] -Depresin, [place], abuso sexual, duelo, crianza de los hijos, manejo de la ira   Programa de Mentes Fuertes a travs de [place] (Ofrece servicios como parte de un programa de investigacin; puede ser elegible para Neomia Dear tarjeta de regalo) [phone], [email or web address] -Depresin, Ansiedad, Trauma   ALBERGUE: HOMBRES, MUJERES Y FAMILIAS CON NIOS ALBERGUE DE VIOLENCIA DOMSTICA PARA MUJERES  -[place] (Servicios familiares) [phone]   -Kathryne Sharper ([facility]) [phone] PARA CLIENTES - [place] ABUSO DE SUSTANCIAS Y ALCOHOL CONSULTE EL SITIO WEB DE SAMHSA PARA

## 2021-06-19 LAB — CBC WITH DIFFERENTIAL/PLATELET
Abs Immature Granulocytes: 0.01 10*3/uL (ref 0.00–0.07)
Basophils Absolute: 0 10*3/uL (ref 0.0–0.1)
Basophils Relative: 1 %
Eosinophils Absolute: 0 10*3/uL (ref 0.0–0.5)
Eosinophils Relative: 2 %
HCT: 28.1 % — ABNORMAL LOW (ref 39.0–52.0)
Hemoglobin: 8.8 g/dL — ABNORMAL LOW (ref 13.0–17.0)
Immature Granulocytes: 1 %
Lymphocytes Relative: 36 %
Lymphs Abs: 0.7 10*3/uL (ref 0.7–4.0)
MCH: 30.1 pg (ref 26.0–34.0)
MCHC: 31.3 g/dL (ref 30.0–36.0)
MCV: 96.2 fL (ref 80.0–100.0)
Monocytes Absolute: 0.2 10*3/uL (ref 0.1–1.0)
Monocytes Relative: 13 %
Neutro Abs: 0.9 10*3/uL — ABNORMAL LOW (ref 1.7–7.7)
Neutrophils Relative %: 47 %
Platelets: 37 10*3/uL — ABNORMAL LOW (ref 150–400)
RBC: 2.92 MIL/uL — ABNORMAL LOW (ref 4.22–5.81)
RDW: 17.6 % — ABNORMAL HIGH (ref 11.5–15.5)
WBC: 1.8 10*3/uL — ABNORMAL LOW (ref 4.0–10.5)
nRBC: 0 % (ref 0.0–0.2)

## 2021-06-19 LAB — COMPREHENSIVE METABOLIC PANEL
ALT: 17 U/L (ref 0–44)
AST: 45 U/L — ABNORMAL HIGH (ref 15–41)
Albumin: 2 g/dL — ABNORMAL LOW (ref 3.5–5.0)
Alkaline Phosphatase: 68 U/L (ref 38–126)
Anion gap: 5 (ref 5–15)
BUN: <5 mg/dL — ABNORMAL LOW (ref 6–20)
CO2: 22 mmol/L (ref 22–32)
Calcium: 7.8 mg/dL — ABNORMAL LOW (ref 8.9–10.3)
Chloride: 107 mmol/L (ref 98–111)
Creatinine, Ser: 0.59 mg/dL — ABNORMAL LOW (ref 0.61–1.24)
GFR, Estimated: >60 mL/min (ref >60)
Glucose, Bld: 81 mg/dL (ref 70–99)
Potassium: 4 mmol/L (ref 3.5–5.1)
Sodium: 134 mmol/L — ABNORMAL LOW (ref 135–145)
Total Bilirubin: 3.5 mg/dL — ABNORMAL HIGH (ref 0.3–1.2)
Total Protein: 5.6 g/dL — ABNORMAL LOW (ref 6.5–8.1)

## 2021-06-19 LAB — PROTIME-INR
INR: 2.3 — ABNORMAL HIGH (ref 0.8–1.2)
Prothrombin Time: 24.8 seconds — ABNORMAL HIGH (ref 11.4–15.2)

## 2021-06-19 LAB — MAGNESIUM: Magnesium: 1.8 mg/dL (ref 1.7–2.4)

## 2021-06-19 MED ORDER — LACTULOSE 10 GM/15ML PO SOLN
20.0000 g | Freq: Two times a day (BID) | ORAL | Status: DC
  Administered 2021-06-19 (×2): 20 g via ORAL
  Filled 2021-06-19 (×2): qty 30

## 2021-06-19 MED ORDER — MAGNESIUM SULFATE 4 GM/100ML IV SOLN
4.0000 g | Freq: Once | INTRAVENOUS | Status: AC
Start: 2021-06-19 — End: 2021-06-19
  Administered 2021-06-19: 4 g via INTRAVENOUS
  Filled 2021-06-19: qty 100

## 2021-06-19 MED ORDER — PROPRANOLOL HCL 20 MG PO TABS
10.0000 mg | ORAL_TABLET | Freq: Two times a day (BID) | ORAL | Status: DC
  Administered 2021-06-19 – 2021-06-21 (×5): 10 mg via ORAL
  Filled 2021-06-19 (×5): qty 1

## 2021-06-19 NOTE — Progress Notes (Signed)
Mathew [name] Mathew [name] 2:57 PM  Subjective: Patient seen and examined and has no new complaints and says he is doing better and we discussed stopping drinking and we reviewed his hospital computer chart  Objective: Vital signs stable afebrile no acute distress abdomen is soft nontender no obvious pedal edema BUN and creatinine okay liver tests unchanged pancytopenia slightly better  Assessment: Alcoholic cirrhosis  Plan: Patient probably needs inpatient alcohol rehab and probably needs a Spanish-speaking doctor to help with compliance and if blood pressure and pulse continue like they are today consider increasing his dose of Inderal to 20 and please call me if I can be of any further assistance this weekend and he needs warnings of no aspirin or nonsteroidals at home as well as no more than 4 Tylenol a day  [facility]  office [phone] After 5PM or if no answer call [phone]

## 2021-06-19 NOTE — Progress Notes (Signed)
PROGRESS NOTE    Mathew [name]  [id number] DOB: 1980-02-02 DOA: 06/16/2021 PCP: [name], MD   Chief Complaint  Patient presents with   Abdominal Pain    Brief Narrative:  Patient is a 41 year old gentleman with a history of alcoholic cirrhosis with recurrent ascites, known umbilical hernia, ongoing alcohol use presenting to the ED with worsening abdominal swelling and pain.  It is noted that paracentesis was arranged in the outpatient setting however patient presented to the ED instead due to ongoing symptoms.  Patient with complaints of diffuse abdominal pain, noted to have umbilical hernia that was reducible and 2+ lower extremity edema.  Patient underwent paracentesis by IR with removal of approximately 5 L of IV fluid and placed empirically on IV antibiotics due to concern for possible SBP.  GI consulted for further evaluation and management of mildly decompensated alcoholic cirrhosis   Assessment & Plan:   Principal Problem:   Decompensated hepatic cirrhosis (HCC) Active Problems:   ETOH abuse   Microcytic anemia   Thrombocytopenia (HCC)   Alcoholic cirrhosis of liver with ascites (HCC)   Edema due to hypoalbuminemia   Umbilical hernia without obstruction and without gangrene   SBP (spontaneous bacterial peritonitis) (HCC)   Pancytopenia (HCC)   Tobacco abuse  #1 acute abdominal pain secondary to massive ascites and concern for SBP -Patient presented with abdominal pain felt secondary to massive ascites contributing to his pain and also concern for SBP. -Patient underwent ultrasound-guided paracentesis by IR with 5.4 L of clear yellow fluid removed with no immediate complications. -Fluid sent for cultures and Gram stain currently pending with no growth to date. -Patient improving clinically. -Continue IV Rocephin and if culture still negative tomorrow will discontinue. -Continue PPI. -Continue lactulose, Lasix, spironolactone. -GI consulted and  following and appreciate input and recommendations.  2.  Mildly decompensated alcoholic cirrhosis -Patient presented massive ascites, abdominal distention and pain, total bilirubin noted to have increased to 3.6 from baseline of 2.3 on admission. -INR at 2.3 this morning. -Patient also noted with a pancytopenia. -??  Compliance with medications. -Compliance stressed to patient. -Continue current regimen of Lasix 40 mg daily, spironolactone 100 mg daily. -Increase lactulose to 20 mg twice daily. -Start nadolol 10 mg twice daily per GI recommendations. -GI following and appreciate input and recommendations.  3.  Grade 2 varices -Patient with no hematemesis or hematochezia. -Patient with alcoholic cirrhosis. -Continue PPI.  4.  Painful umbilical hernia -Patient with some complaints of umbilical hernia noted to be distended but soft and easily reducible. -CT abdomen and pelvis done unremarkable with no incarceration or obstructive process noted. -Outpatient follow-up with general surgery.  5.  Systolic murmur -Noted per admitting physician. -2D echo done with EF of 55 to 60%, no wall motion abnormalities, normal right ventricular systolic function.  Severely dilated left atrial size. -Follow.  6.  Pancytopenia -Likely secondary to alcoholic cirrhosis. -Patient with no overt bleeding. -Anemia panel with iron level of 18, TIBC of 185, ferritin of 14, folate of 10.9.  Vitamin B12 levels of 806. -Follow H&H. -Transfusion threshold hemoglobin < 7.  7.  Tobacco abuse -Tobacco cessation.  8.  History of alcohol abuse -Alcohol cessation stressed to patient. -No significant withdrawal noted. -Continue the Ativan withdrawal protocol, thiamine, folic acid, multivitamin.  9.  Hypomagnesemia -Magnesium level of 1.8 -Likely secondary to ongoing alcohol abuse. -Magnesium sulfate 4 g IV x1. -Repeat labs in the AM.   DVT prophylaxis: SCDs Code Status: Full Family Communication:  Updated  patient via interpreter.  Disposition: Home when clinically improved, hopefully in the next 24 hours  Status is: Inpatient Remains inpatient appropriate because: Severity of illness   Consultants:  GI: Dr. Watt Climes 06/18/2021  Procedures:  Ultrasound-guided paracentesis per IR 06/17/2021 with 5.4 L of clear yellow fluid removed, [name], PA Chest x-ray 06/17/2021 CT abdomen and pelvis 06/16/2021 2D echo 06/17/2021     Antimicrobials:  IV Rocephin 06/17/2021 >>>>>    Subjective: Laying in bed.  Denies any abdominal pain.  No chest pain.  No shortness of breath.  Tolerating current diet.  No nausea or emesis.  Asking when he is going to be able to go home.   Objective: Vitals:   06/18/21 2017 06/19/21 0425 06/19/21 0428 06/19/21 0816  BP: 113/69 113/77  110/68  Pulse: 89 74  79  Resp: 19 16  16   Temp: 98 F (36.7 C) 98.5 F (36.9 C)  98.6 F (37 C)  TempSrc: Oral Oral  Oral  SpO2: 100% 100%  100%  Weight:   78.9 kg   Height:        Intake/Output Summary (Last 24 hours) at 06/19/2021 1228 Last data filed at 06/19/2021 0836 Gross per 24 hour  Intake 480 ml  Output 2325 ml  Net -1845 ml    Filed Weights   06/17/21 1855 06/18/21 0500 06/19/21 0428  Weight: 78.8 kg 79.2 kg 78.9 kg    Examination:  General exam: NAD. Respiratory system: CTA B.  No wheezes, no crackles, no rhonchi.  Fair air movement.  Speaking in full sentences.  Cardiovascular system: Regular rate rhythm no murmurs rubs or gallops.  No JVD.  Trace to 1+ bilateral lower extremity edema. Gastrointestinal system: Abdomen is soft, nontender, nondistended, positive bowel sounds.  No rebound.  No guarding.  Reducible umbilical hernia.  Central nervous system: Alert and oriented. No focal neurological deficits. Extremities: Symmetric 5 x 5 power. Skin: No rashes, lesions or ulcers Psychiatry: Judgement and insight appear normal. Mood & affect appropriate.     Data Reviewed: I have personally reviewed  following labs and imaging studies  CBC: Recent Labs  Lab 06/15/21 1457 06/16/21 1407 06/18/21 0723 06/19/21 0127  WBC 1.8* 1.6* 1.3* 1.8*  NEUTROABS 0.8* 0.8*  --  0.9*  HGB 9.4* 10.1* 8.9* 8.8*  HCT 28.4* 33.2* 26.7* 28.1*  MCV 87 95.7 92.7 96.2  PLT 34* 39* 31* 37*     Basic Metabolic Panel: Recent Labs  Lab 06/15/21 1457 06/16/21 1407 06/17/21 0931 06/18/21 0723 06/19/21 0127  NA 136 138  --  133* 134*  K 3.5 3.5  --  3.7 4.0  CL 106 112*  --  107 107  CO2 23 22  --  19* 22  GLUCOSE 120* 135*  --  81 81  BUN 5* <5*  --  <5* <5*  CREATININE 0.54* 0.58*  --  0.51* 0.59*  CALCIUM 7.9* 7.9*  --  7.7* 7.8*  MG  --   --  1.7 1.6* 1.8  PHOS  --   --  3.6  --   --      GFR: Estimated Creatinine Clearance: 115.4 mL/min (A) (by C-G formula based on SCr of 0.59 mg/dL (L)).  Liver Function Tests: Recent Labs  Lab 06/15/21 1457 06/16/21 1407 06/18/21 0723 06/19/21 0127  AST 78* 73* 46* 45*  ALT 24 25 17 17   ALKPHOS 146* 112 79 68  BILITOT 2.8* 3.6* 3.7* 3.5*  PROT 6.5 6.6 5.4*  5.6*  ALBUMIN 2.8* 2.3* 2.0* 2.0*     CBG: No results for input(s): "GLUCAP" in the last 168 hours.   Recent Results (from the past 240 hour(s))  Urine Culture     Status: Abnormal   Collection Time: 06/17/21  8:27 AM   Specimen: Urine, Clean Catch  Result Value Ref Range Status   Specimen Description URINE, CLEAN CATCH  Final   Special Requests NONE  Final   Culture (A)  Final    <10,000 COLONIES/mL INSIGNIFICANT GROWTH Performed at [facility], [address], [place], [place] [address]    Report Status 06/18/2021 FINAL  Final  Culture, body fluid w Gram Stain-bottle     Status: None (Preliminary result)   Collection Time: 06/17/21 12:06 PM   Specimen: Peritoneal Washings  Result Value Ref Range Status   Specimen Description PERITONEAL FLUID ABDOMEN  Final   Special Requests NONE  Final   Culture   Final    NO GROWTH 2 DAYS Performed at [facility] [facility] [address], [place], [place] [address]    Report Status PENDING  Incomplete  Gram stain     Status: None   Collection Time: 06/17/21 12:06 PM   Specimen: Peritoneal Washings  Result Value Ref Range Status   Specimen Description PERITONEAL FLUID ABDOMEN  Final   Special Requests NONE  Final   Gram Stain   Final    WBC PRESENT, PREDOMINANTLY MONONUCLEAR NO ORGANISMS SEEN CYTOSPIN SMEAR Performed at [facility], [place] [address], [place], [place] [address]    Report Status 06/17/2021 FINAL  Final         Radiology Studies: IR Paracentesis  Result Date: 06/17/2021 INDICATION: Ascites secondary to alcoholic cirrhosis. Request for diagnostic and therapeutic paracentesis. EXAM: ULTRASOUND GUIDED PARACENTESIS MEDICATIONS: 1% lidocaine 10 mL COMPLICATIONS: None immediate. PROCEDURE: Informed written consent was obtained from the patient after a discussion of the risks, benefits and alternatives to treatment. A timeout was performed prior to the initiation of the procedure. Initial ultrasound scanning demonstrates a large amount of ascites within the right lower abdominal quadrant. The right lower abdomen was prepped and draped in the usual sterile fashion. 1% lidocaine was used for local anesthesia. Following this, a 19 gauge, 7-cm, Yueh catheter was introduced. An ultrasound image was saved for documentation purposes. The paracentesis was performed. The catheter was removed and a dressing was applied. The patient tolerated the procedure well without immediate post procedural complication. Patient received post-procedure intravenous albumin; see nursing notes for details. FINDINGS: A total of approximately 5.4 L of clear yellow fluid was removed. Samples were sent to the laboratory as requested by the clinical team. IMPRESSION: Successful ultrasound-guided paracentesis yielding 5.4 liters of peritoneal fluid. PLAN: If the patient eventually requires >/=2 paracenteses in a 30 day period, candidacy  for formal evaluation by the [facility] will be assessed. Procedure performed by: [name], PA-C Electronically Signed   By: Lucrezia Europe M.D.   On: 06/17/2021 14:55        Scheduled Meds:  folic acid  1 mg Oral Daily   furosemide  40 mg Intravenous Once   furosemide  40 mg Oral Daily   lactulose  20 g Oral BID   LORazepam  0-4 mg Oral Q12H   multivitamin with minerals  1 tablet Oral Daily   pantoprazole  40 mg Oral Q0600   sodium chloride flush  3 mL Intravenous Q12H  spironolactone  100 mg Oral Daily   thiamine  100 mg Oral Daily   Continuous Infusions:  sodium chloride     cefTRIAXone (ROCEPHIN)  IV 2 g (06/19/21 0836)     LOS: 2 days    Time spent: 40 minutes    Irine Seal, MD [facility]   To contact the attending provider between 7A-7P or the covering provider during after hours 7P-7A, please log into the web site [email or web address] and access using universal [place] password for that web site. If you do not have the password, please call the hospital operator.  06/19/2021, 12:28 PM

## 2021-06-20 DIAGNOSIS — G9341 Metabolic encephalopathy: Secondary | ICD-10-CM

## 2021-06-20 HISTORY — DX: Metabolic encephalopathy: G93.41

## 2021-06-20 LAB — COMPREHENSIVE METABOLIC PANEL
ALT: 17 U/L (ref 0–44)
AST: 43 U/L — ABNORMAL HIGH (ref 15–41)
Albumin: 2.2 g/dL — ABNORMAL LOW (ref 3.5–5.0)
Alkaline Phosphatase: 79 U/L (ref 38–126)
Anion gap: 5 (ref 5–15)
BUN: 7 mg/dL (ref 6–20)
CO2: 23 mmol/L (ref 22–32)
Calcium: 7.9 mg/dL — ABNORMAL LOW (ref 8.9–10.3)
Chloride: 104 mmol/L (ref 98–111)
Creatinine, Ser: 0.76 mg/dL (ref 0.61–1.24)
GFR, Estimated: >60 mL/min (ref >60)
Glucose, Bld: 99 mg/dL (ref 70–99)
Potassium: 4.3 mmol/L (ref 3.5–5.1)
Sodium: 132 mmol/L — ABNORMAL LOW (ref 135–145)
Total Bilirubin: 2.9 mg/dL — ABNORMAL HIGH (ref 0.3–1.2)
Total Protein: 5.9 g/dL — ABNORMAL LOW (ref 6.5–8.1)

## 2021-06-20 LAB — CBC
HCT: 29.3 % — ABNORMAL LOW (ref 39.0–52.0)
Hemoglobin: 9.3 g/dL — ABNORMAL LOW (ref 13.0–17.0)
MCH: 30.3 pg (ref 26.0–34.0)
MCHC: 31.7 g/dL (ref 30.0–36.0)
MCV: 95.4 fL (ref 80.0–100.0)
Platelets: 54 10*3/uL — ABNORMAL LOW (ref 150–400)
RBC: 3.07 MIL/uL — ABNORMAL LOW (ref 4.22–5.81)
RDW: 17.6 % — ABNORMAL HIGH (ref 11.5–15.5)
WBC: 3.4 10*3/uL — ABNORMAL LOW (ref 4.0–10.5)
nRBC: 0 % (ref 0.0–0.2)

## 2021-06-20 LAB — MAGNESIUM: Magnesium: 2 mg/dL (ref 1.7–2.4)

## 2021-06-20 LAB — AMMONIA: Ammonia: 40 umol/L — ABNORMAL HIGH (ref 9–35)

## 2021-06-20 MED ORDER — LORAZEPAM 2 MG/ML IJ SOLN: 1.0000 mg | INTRAMUSCULAR | Status: DC | PRN

## 2021-06-20 MED ORDER — LACTULOSE 10 GM/15ML PO SOLN
20.0000 g | Freq: Three times a day (TID) | ORAL | Status: DC
  Administered 2021-06-20 – 2021-06-21 (×6): 20 g via ORAL
  Filled 2021-06-20 (×6): qty 30

## 2021-06-20 MED ORDER — PROPRANOLOL HCL 10 MG PO TABS: 10.0000 mg | ORAL_TABLET | Freq: Two times a day (BID) | ORAL | 1 refills | Status: DC

## 2021-06-20 MED ORDER — ADULT MULTIVITAMIN W/MINERALS CH
1.0000 | ORAL_TABLET | Freq: Every day | ORAL | Status: DC
  Administered 2021-06-21 – 2021-06-22 (×2): 1 via ORAL
  Filled 2021-06-20 (×2): qty 1

## 2021-06-20 MED ORDER — THIAMINE HCL 100 MG/ML IJ SOLN: 100.0000 mg | Freq: Every day | INTRAMUSCULAR | Status: DC

## 2021-06-20 MED ORDER — PANTOPRAZOLE SODIUM 40 MG PO TBEC: 40.0000 mg | DELAYED_RELEASE_TABLET | Freq: Every day | ORAL | 1 refills | Status: DC

## 2021-06-20 MED ORDER — LACTULOSE 10 GM/15ML PO SOLN: 20.0000 g | Freq: Every day | ORAL | 1 refills | Status: DC

## 2021-06-20 MED ORDER — SPIRONOLACTONE 100 MG PO TABS: 100.0000 mg | ORAL_TABLET | Freq: Every day | ORAL | 1 refills | Status: DC

## 2021-06-20 MED ORDER — FOLIC ACID 1 MG PO TABS
1.0000 mg | ORAL_TABLET | Freq: Every day | ORAL | Status: DC
  Administered 2021-06-21 – 2021-06-22 (×2): 1 mg via ORAL
  Filled 2021-06-20 (×2): qty 1

## 2021-06-20 MED ORDER — ADULT MULTIVITAMIN W/MINERALS CH: 1.0000 | ORAL_TABLET | Freq: Every day | ORAL | Status: DC

## 2021-06-20 MED ORDER — FUROSEMIDE 40 MG PO TABS: 40.0000 mg | ORAL_TABLET | Freq: Every day | ORAL | 1 refills | Status: DC

## 2021-06-20 MED ORDER — THIAMINE HCL 100 MG PO TABS
100.0000 mg | ORAL_TABLET | Freq: Every day | ORAL | Status: DC
  Administered 2021-06-21 – 2021-06-22 (×2): 100 mg via ORAL
  Filled 2021-06-20 (×2): qty 1

## 2021-06-20 MED ORDER — LORAZEPAM 1 MG PO TABS: 1.0000 mg | ORAL_TABLET | ORAL | Status: DC | PRN

## 2021-06-20 MED ORDER — ACETAMINOPHEN 500 MG PO TABS: 500.0000 mg | ORAL_TABLET | Freq: Four times a day (QID) | ORAL | Status: DC | PRN

## 2021-06-20 NOTE — Progress Notes (Signed)
PROGRESS NOTE    Mathew [name]  [id number] DOB: 1980-06-09 DOA: 06/16/2021 PCP: [name], MD   Chief Complaint  Patient presents with   Abdominal Pain    Brief Narrative:  Patient is a 41 year old gentleman with a history of alcoholic cirrhosis with recurrent ascites, known umbilical hernia, ongoing alcohol use presenting to the ED with worsening abdominal swelling and pain.  It is noted that paracentesis was arranged in the outpatient setting however patient presented to the ED instead due to ongoing symptoms.  Patient with complaints of diffuse abdominal pain, noted to have umbilical hernia that was reducible and 2+ lower extremity edema.  Patient underwent paracentesis by IR with removal of approximately 5 L of IV fluid and placed empirically on IV antibiotics due to concern for possible SBP.  GI consulted for further evaluation and management of mildly decompensated alcoholic cirrhosis -Patient noted on 06/20/2021 to be confused, concern for hepatic encephalopathy.   Assessment & Plan:   Principal Problem:   Decompensated hepatic cirrhosis (HCC) Active Problems:   Acute metabolic encephalopathy   ETOH abuse   Microcytic anemia   Thrombocytopenia (HCC)   Alcoholic cirrhosis of liver with ascites (HCC)   Edema due to hypoalbuminemia   Umbilical hernia without obstruction and without gangrene   SBP (spontaneous bacterial peritonitis) (HCC)   Pancytopenia (HCC)   Tobacco abuse  #1 acute abdominal pain secondary to massive ascites and concern for SBP -Patient presented with abdominal pain felt secondary to massive ascites contributing to his pain and also concern for SBP. -Patient underwent ultrasound-guided paracentesis by IR with 5.4 L of clear yellow fluid removed with no immediate complications. -Fluid sent for cultures and Gram stain currently pending with no growth to date. -Patient improving clinically. -Cultures negative with no growth to date.    -Discontinue IV Rocephin.  -Continue PPI. -Continue lactulose, Lasix, spironolactone. -GI consulted and following and appreciate input and recommendations.  2.  Mildly decompensated alcoholic cirrhosis -Patient presented massive ascites, abdominal distention and pain, total bilirubin noted to have increased to 3.6 from baseline of 2.3 on admission. -INR at 2.3 (06/19/2021) -Patient also noted with a pancytopenia. -??  Compliance with medications. -Compliance stressed to patient. -Patient confused this morning, concern for possible hepatic encephalopathy. -Continue current regimen of Lasix 40 mg daily, spironolactone 100 mg daily. -Increase lactulose to 30 mg 3 times daily.   -Continue nadolol 10 mg twice daily per GI recommendations.  -GI following and appreciate input and recommendations.  3.  Acute metabolic encephalopathy -Patient noted to be confused this morning.  Patient trying to bite at telemetry leads.  Patient alert to self only. -Patient noted per RN to be pacing the hallways all night and having hallucinations of seeing his children in the hallways. -Concern for hepatic encephalopathy. -Check ammonia level. -Increase lactulose to 30 g 3 times daily. -Continue the Ativan withdrawal protocol. -Supportive care.  4.  Grade 2 varices -Patient with no hematemesis or hematochezia. -Patient with alcoholic cirrhosis. -Continue PPI. -Patient started on nadolol.  5.  Painful umbilical hernia -Patient with some complaints of umbilical hernia noted to be distended but soft and easily reducible. -CT abdomen and pelvis done unremarkable with no incarceration or obstructive process noted. -Patient with no significant abdominal pain today. -Outpatient follow-up with general surgery.  6.  Systolic murmur -Noted per admitting physician. -2D echo done with EF of 55 to 60%, no wall motion abnormalities, normal right ventricular systolic function.  Severely dilated left atrial  size. -Follow.  7.  Pancytopenia -Likely secondary to alcoholic cirrhosis. -Patient with no overt bleeding. -Anemia panel with iron level of 18, TIBC of 185, ferritin of 14, folate of 10.9.  Vitamin B12 levels of 806. -Counts slowly improving. -Follow H&H. -Transfusion threshold hemoglobin < 7.  8.  Tobacco abuse -Tobacco cessation.  9.  History of alcohol abuse -Alcohol cessation stressed to patient. -No significant withdrawal noted. -Patient however confused this morning with some hallucinations. -Continue the Ativan withdrawal protocol, thiamine, folic acid, multivitamin.  10.  Hypomagnesemia -Magnesium level of 1.8 -Likely secondary to ongoing alcohol abuse. -Magnesium at 2.0.   DVT prophylaxis: SCDs Code Status: Full Family Communication: Updated patient via interpreter.  Updated family. Disposition: Home when clinically improved, with improvement with confusion hopefully in the next 1 to 2 days.  Status is: Inpatient Remains inpatient appropriate because: Severity of illness   Consultants:  GI: Dr. Watt Climes 06/18/2021  Procedures:  Ultrasound-guided paracentesis per IR 06/17/2021 with 5.4 L of clear yellow fluid removed, [name], PA Chest x-ray 06/17/2021 CT abdomen and pelvis 06/16/2021 2D echo 06/17/2021     Antimicrobials:  IV Rocephin 06/17/2021 >>>>> 06/20/2021    Subjective: Patient sitting up in chair in hallway iPad interpreter noted.  RN beside patient.  Patient confused.  Patient tried to bite at telemetry leads.  Alert to self only.  Per RN patient noted to be pacing hallways all night and was hallucinating seeing his children.  Patient denies any chest pain.  No shortness of breath.  No abdominal pain.  Patient not engaging appropriately.   Objective: Vitals:   06/19/21 0816 06/19/21 2114 06/20/21 0251 06/20/21 0256  BP: 110/68 (!) 122/98 107/64   Pulse: 79 73 71   Resp: 16 17 16    Temp: 98.6 F (37 C) 98.6 F (37 C) 98.5 F (36.9 C)   TempSrc:  Oral Oral Oral   SpO2: 100% 100% 100%   Weight:    79.9 kg  Height:        Intake/Output Summary (Last 24 hours) at 06/20/2021 0948 Last data filed at 06/20/2021 0600 Gross per 24 hour  Intake 580 ml  Output 625 ml  Net -45 ml    Filed Weights   06/18/21 0500 06/19/21 0428 06/20/21 0256  Weight: 79.2 kg 78.9 kg 79.9 kg    Examination:  General exam: NAD.  Confused. Respiratory system: CTA B.  No wheezes, no crackles, no rhonchi.  Fair air movement.  Speaking in full sentences.  Cardiovascular system: RRR no murmurs rubs or gallops.  No JVD.  Trace bilateral lower extremity edema.  Gastrointestinal system: Abdomen is soft, nontender, nondistended, positive bowel sounds.  No rebound.  No guarding.  Reducible umbilical hernia.  Central nervous system: Alert to self.  Confused.  Moving extremities spontaneously.  Extremities: Symmetric 5 x 5 power. Skin: No rashes, lesions or ulcers Psychiatry: Judgement and insight appear poor. Mood & affect somewhat flat .    Data Reviewed: I have personally reviewed following labs and imaging studies  CBC: Recent Labs  Lab 06/15/21 1457 06/16/21 1407 06/18/21 0723 06/19/21 0127 06/20/21 0128  WBC 1.8* 1.6* 1.3* 1.8* 3.4*  NEUTROABS 0.8* 0.8*  --  0.9*  --   HGB 9.4* 10.1* 8.9* 8.8* 9.3*  HCT 28.4* 33.2* 26.7* 28.1* 29.3*  MCV 87 95.7 92.7 96.2 95.4  PLT 34* 39* 31* 37* 54*     Basic Metabolic Panel: Recent Labs  Lab 06/15/21 1457 06/16/21 1407 06/17/21 0931 06/18/21 [id number]  06/19/21 0127 06/20/21 0128  NA 136 138  --  133* 134* 132*  K 3.5 3.5  --  3.7 4.0 4.3  CL 106 112*  --  107 107 104  CO2 23 22  --  19* 22 23  GLUCOSE 120* 135*  --  81 81 99  BUN 5* <5*  --  <5* <5* 7  CREATININE 0.54* 0.58*  --  0.51* 0.59* 0.76  CALCIUM 7.9* 7.9*  --  7.7* 7.8* 7.9*  MG  --   --  1.7 1.6* 1.8 2.0  PHOS  --   --  3.6  --   --   --      GFR: Estimated Creatinine Clearance: 116 mL/min (by C-G formula based on SCr of 0.76  mg/dL).  Liver Function Tests: Recent Labs  Lab 06/15/21 1457 06/16/21 1407 06/18/21 0723 06/19/21 0127 06/20/21 0128  AST 78* 73* 46* 45* 43*  ALT 24 25 17 17 17   ALKPHOS 146* 112 79 68 79  BILITOT 2.8* 3.6* 3.7* 3.5* 2.9*  PROT 6.5 6.6 5.4* 5.6* 5.9*  ALBUMIN 2.8* 2.3* 2.0* 2.0* 2.2*     CBG: No results for input(s): "GLUCAP" in the last 168 hours.   Recent Results (from the past 240 hour(s))  Urine Culture     Status: Abnormal   Collection Time: 06/17/21  8:27 AM   Specimen: Urine, Clean Catch  Result Value Ref Range Status   Specimen Description URINE, CLEAN CATCH  Final   Special Requests NONE  Final   Culture (A)  Final    <10,000 COLONIES/mL INSIGNIFICANT GROWTH Performed at [facility], [address] [place], [place] [address]    Report Status 06/18/2021 FINAL  Final  Culture, body fluid w Gram Stain-bottle     Status: None (Preliminary result)   Collection Time: 06/17/21 12:06 PM   Specimen: Peritoneal Washings  Result Value Ref Range Status   Specimen Description PERITONEAL FLUID ABDOMEN  Final   Special Requests NONE  Final   Culture   Final    NO GROWTH 2 DAYS Performed at [facility] [address] [place], [place] [address]    Report Status PENDING  Incomplete  Gram stain     Status: None   Collection Time: 06/17/21 12:06 PM   Specimen: Peritoneal Washings  Result Value Ref Range Status   Specimen Description PERITONEAL FLUID ABDOMEN  Final   Special Requests NONE  Final   Gram Stain   Final    WBC PRESENT, PREDOMINANTLY MONONUCLEAR NO ORGANISMS SEEN CYTOSPIN SMEAR Performed at [facility], [place] [address], [place],  [address]    Report Status 06/17/2021 FINAL  Final         Radiology Studies: No results found.      Scheduled Meds:  folic acid  1 mg Oral Daily   furosemide  40 mg Intravenous Once   furosemide  40 mg Oral Daily   lactulose  20 g Oral BID   LORazepam  0-4 mg Oral Q12H    multivitamin with minerals  1 tablet Oral Daily   pantoprazole  40 mg Oral Q0600   propranolol  10 mg Oral BID   sodium chloride flush  3 mL Intravenous Q12H   spironolactone  100 mg Oral Daily   thiamine  100 mg Oral Daily   Continuous Infusions:  sodium chloride     cefTRIAXone (ROCEPHIN)  IV Stopped (06/20/21 [id number])  LOS: 3 days    Time spent: 40 minutes    Irine Seal, MD [facility]   To contact the attending provider between 7A-7P or the covering provider during after hours 7P-7A, please log into the web site [email or web address] and access using universal [place] password for that web site. If you do not have the password, please call the hospital operator.  06/20/2021, 9:48 AM

## 2021-06-20 NOTE — Progress Notes (Signed)
OT Cancellation Note  Patient Details Name: Mathew [name] MRN: [id number] DOB: 1980/11/21   Cancelled Treatment:    Reason Eval/Treat Not Completed: Patient not medically ready Rn requesting to hold at this time due to arousal with medication   [name] 06/20/2021, 3:50 PM

## 2021-06-20 NOTE — Progress Notes (Signed)
Pt has been roaming the halls since last night. Gave pt 2 mg of lorazepam. Pt is calling out for family members that are not present. He states "he needs to help the 4 children before they get hurt". Nurse open the bathroom door so patient can see that no one was in the bathroom at this time. Pt wants to go home. Notify MD via secure chat.  Nurse called daughter Coralie Common to see if she was able to sit with her father or could she get someone else to sit with him.  Brother Alecia Lemming was able to sit the patient until he fell asleep.

## 2021-06-20 NOTE — Progress Notes (Signed)
PT Cancellation Note  Patient Details Name: Mathew [name] MRN: [id number] DOB: [birth year]   Cancelled Treatment:    Reason Eval/Treat Not Completed: Other (comment)  Discussed status with pt's bedside nurse; Pt has been confused, inappropriate throughout the day;  Has needed meds to settle;   Will hold PT eval today, and follow up tomorrow;   Van Clines, PT  Acute Rehabilitation Services Office [phone]    Levi Aland 06/20/2021, 4:16 PM

## 2021-06-21 LAB — COMPREHENSIVE METABOLIC PANEL
ALT: 16 U/L (ref 0–44)
AST: 37 U/L (ref 15–41)
Albumin: 1.9 g/dL — ABNORMAL LOW (ref 3.5–5.0)
Alkaline Phosphatase: 68 U/L (ref 38–126)
Anion gap: 4 — ABNORMAL LOW (ref 5–15)
BUN: 7 mg/dL (ref 6–20)
CO2: 24 mmol/L (ref 22–32)
Calcium: 7.7 mg/dL — ABNORMAL LOW (ref 8.9–10.3)
Chloride: 106 mmol/L (ref 98–111)
Creatinine, Ser: 0.6 mg/dL — ABNORMAL LOW (ref 0.61–1.24)
GFR, Estimated: >60 mL/min (ref >60)
Glucose, Bld: 81 mg/dL (ref 70–99)
Potassium: 3.5 mmol/L (ref 3.5–5.1)
Sodium: 134 mmol/L — ABNORMAL LOW (ref 135–145)
Total Bilirubin: 2.7 mg/dL — ABNORMAL HIGH (ref 0.3–1.2)
Total Protein: 5.1 g/dL — ABNORMAL LOW (ref 6.5–8.1)

## 2021-06-21 LAB — CBC WITH DIFFERENTIAL/PLATELET
Abs Immature Granulocytes: 0 10*3/uL (ref 0.00–0.07)
Basophils Absolute: 0 10*3/uL (ref 0.0–0.1)
Basophils Relative: 0 %
Eosinophils Absolute: 0 10*3/uL (ref 0.0–0.5)
Eosinophils Relative: 2 %
HCT: 27.5 % — ABNORMAL LOW (ref 39.0–52.0)
Hemoglobin: 8.6 g/dL — ABNORMAL LOW (ref 13.0–17.0)
Immature Granulocytes: 0 %
Lymphocytes Relative: 38 %
Lymphs Abs: 0.7 10*3/uL (ref 0.7–4.0)
MCH: 29.9 pg (ref 26.0–34.0)
MCHC: 31.3 g/dL (ref 30.0–36.0)
MCV: 95.5 fL (ref 80.0–100.0)
Monocytes Absolute: 0.3 10*3/uL (ref 0.1–1.0)
Monocytes Relative: 19 %
Neutro Abs: 0.7 10*3/uL — ABNORMAL LOW (ref 1.7–7.7)
Neutrophils Relative %: 41 %
Platelets: 37 10*3/uL — ABNORMAL LOW (ref 150–400)
RBC: 2.88 MIL/uL — ABNORMAL LOW (ref 4.22–5.81)
RDW: 17.6 % — ABNORMAL HIGH (ref 11.5–15.5)
WBC: 1.7 10*3/uL — ABNORMAL LOW (ref 4.0–10.5)
nRBC: 0 % (ref 0.0–0.2)

## 2021-06-21 LAB — MAGNESIUM: Magnesium: 1.7 mg/dL (ref 1.7–2.4)

## 2021-06-21 LAB — AMMONIA: Ammonia: 29 umol/L (ref 9–35)

## 2021-06-21 MED ORDER — PROPRANOLOL HCL 10 MG PO TABS
5.0000 mg | ORAL_TABLET | Freq: Two times a day (BID) | ORAL | Status: DC
  Administered 2021-06-21 – 2021-06-22 (×2): 5 mg via ORAL
  Filled 2021-06-21 (×3): qty 0.5

## 2021-06-21 MED ORDER — MAGNESIUM SULFATE 4 GM/100ML IV SOLN
4.0000 g | Freq: Once | INTRAVENOUS | Status: AC
Start: 2021-06-21 — End: 2021-06-21
  Administered 2021-06-21: 4 g via INTRAVENOUS
  Filled 2021-06-21: qty 100

## 2021-06-21 MED ORDER — RIFAXIMIN 550 MG PO TABS
550.0000 mg | ORAL_TABLET | Freq: Two times a day (BID) | ORAL | Status: DC
  Administered 2021-06-21 – 2021-06-22 (×3): 550 mg via ORAL
  Filled 2021-06-21 (×3): qty 1

## 2021-06-21 MED ORDER — POTASSIUM CHLORIDE CRYS ER 10 MEQ PO TBCR
40.0000 meq | EXTENDED_RELEASE_TABLET | Freq: Once | ORAL | Status: AC
  Administered 2021-06-21: 40 meq via ORAL
  Filled 2021-06-21: qty 4

## 2021-06-21 NOTE — Evaluation (Signed)
Occupational Therapy Evaluation Patient Details Name: Mathew [name] MRN: [id number] DOB: [birth year] Today's Date: 06/21/2021   History of Present Illness Pt is a 41 y.o. M who presents 06/16/2021 with worsening abdominal pain and swelling. Pt underwent paracentesis by IR. Also noted to be confused with concern for hepatic encephalopathy. Significant PMH: ETOH abuse, tobacco abuse, alcoholic cirrhosis with recurrent ascites.   Clinical Impression   PT underwent paracentesis and concerns for hepatic encephalopathy. Pt currently with functional limitiations due to the deficits listed below (see OT problem list). Pt with balance deficits affecting all adls and high fall risk. Pt did not have awareness that he had not eaten today. Pt denies any deficits. Pt allowed to have large LOB during session and even after reports no balance problems.  Pt will benefit from skilled OT to increase their independence and safety with adls and balance to allow discharge outpatient OT needs lacks insurance. The patient is unhoused per family but has been staying with a friend. The patient technically is homeless.        Recommendations for follow up therapy are one component of a multi-disciplinary discharge planning process, led by the attending physician.  Recommendations may be updated based on patient status, additional functional criteria and insurance authorization.   Follow Up Recommendations  Outpatient OT    Assistance Recommended at Discharge Intermittent Supervision/Assistance  Patient can return home with the following A little help with walking and/or transfers;A little help with bathing/dressing/bathroom;Assistance with cooking/housework;Assistance with feeding;Assist for transportation    Functional Status Assessment  Patient has had a recent decline in their functional status and demonstrates the ability to make significant improvements in function in a reasonable and predictable amount of  time.  Equipment Recommendations  None recommended by OT    Recommendations for Other Services Speech consult (cognition)     Precautions / Restrictions Precautions Precautions: Fall Restrictions Weight Bearing Restrictions: No      Mobility Bed Mobility Overal bed mobility: Needs Assistance Bed Mobility: Sit to Supine       Sit to supine: Min guard   General bed mobility comments: pt laying back in the bed sitting eob and needed increase time to correct himself in the bed to properly have head toward headboard. pt was initially at an angle    Transfers Overall transfer level: Needs assistance Equipment used: None Transfers: Sit to/from Stand Sit to Stand: Min guard           General transfer comment: pt bracing against the bed surface. pt asked to step foward and immediately LOB posteriorly      Balance Overall balance assessment: Needs assistance Sitting-balance support: Feet supported Sitting balance-Leahy Scale: Good Sitting balance - Comments: donning shoes EOB   Standing balance support: No upper extremity supported, During functional activity Standing balance-Leahy Scale: Fair Standing balance comment: fair statically/ poor with head turns and reach                           ADL either performed or assessed with clinical judgement   ADL Overall ADL's : Needs assistance/impaired Eating/Feeding: Minimal assistance;Bed level Eating/Feeding Details (indicate cue type and reason): needed cues to initiate task. pt needed (A) to sequence task of putting butter on bread Grooming: Minimal assistance   Upper Body Bathing: Moderate assistance   Lower Body Bathing: Moderate assistance   Upper Body Dressing : Minimal assistance   Lower Body Dressing: Minimal assistance Lower Body Dressing  Details (indicate cue type and reason): pt requires seated position Toilet Transfer: Moderate assistance           Functional mobility during ADLs:  Moderate assistance General ADL Comments: pt with LOB without ability to self correct. Session focused on allowing to have LOB and bringing awareness to the deficit     Vision   Additional Comments: no deficits noted at this time     Perception     Praxis      Pertinent Vitals/Pain Pain Assessment Pain Assessment: No/denies pain     Hand Dominance Left   Extremity/Trunk Assessment Upper Extremity Assessment Upper Extremity Assessment: Overall WFL for tasks assessed   Lower Extremity Assessment Lower Extremity Assessment: Defer to PT evaluation   Cervical / Trunk Assessment Cervical / Trunk Assessment: Normal   Communication Communication Communication: Prefers language other than [place] (Spanish speaking)   Cognition Arousal/Alertness: Awake/alert Behavior During Therapy: Flat affect Overall Cognitive Status: Impaired/Different from baseline Area of Impairment: Memory, Safety/judgement, Awareness, Problem solving                     Memory: Decreased short-term memory   Safety/Judgement: Decreased awareness of safety, Decreased awareness of deficits Awareness: Intellectual Problem Solving: Requires verbal cues General Comments: Pt family members report pt with improving cognition today. Pt following all one step commands. Demonstrates poor awareness and recognition of deficits; states his balance is "good," although experienced numerous episodes of gross LOB without self righting strategies.pt reports he had eaten and family in room reports no intake all day     General Comments       Exercises     Shoulder Instructions      Home Living Family/patient expects to be discharged to:: Shelter/Homeless                                 Additional Comments: Pt has been living with a friend, who also has ETOH abuse. No stable housing situation currently. Pt family (aunt, friend, father) present on evaluation      Prior Functioning/Environment  Prior Level of Function : Independent/Modified Independent             Mobility Comments: Not working          OT Problem List: Decreased activity tolerance;Impaired balance (sitting and/or standing);Decreased cognition;Decreased safety awareness;Decreased knowledge of use of DME or AE;Decreased knowledge of precautions      OT Treatment/Interventions: Self-care/ADL training;Therapeutic exercise;Energy conservation;DME and/or AE instruction;Manual therapy;Modalities;Therapeutic activities;Cognitive remediation/compensation;Patient/family education;Balance training    OT Goals(Current goals can be found in the care plan section) Acute Rehab OT Goals Patient Stated Goal: to go home OT Goal Formulation: With patient/family Time For Goal Achievement: 07/05/21 Potential to Achieve Goals: Good  OT Frequency: Min 2X/week    Co-evaluation              AM-PAC OT "6 Clicks" Daily Activity     Outcome Measure Help from another person eating meals?: A Little Help from another person taking care of personal grooming?: A Little Help from another person toileting, which includes using toliet, bedpan, or urinal?: A Lot Help from another person bathing (including washing, rinsing, drying)?: A Lot Help from another person to put on and taking off regular upper body clothing?: A Little Help from another person to put on and taking off regular lower body clothing?: A Lot 6 Click Score: 15   End of  Session Equipment Utilized During Treatment: Gait belt Nurse Communication: Mobility status;Precautions  Activity Tolerance: Patient tolerated treatment well Patient left: in bed;with call bell/phone within reach;with bed alarm set;with family/visitor present (father educated to push the button for help and to tell the patient to remain in the bed.)  OT Visit Diagnosis: Unsteadiness on feet (R26.81);Muscle weakness (generalized) (M62.81);Other abnormalities of gait and mobility (R26.89)                 Time: 1131-1200 OT Time Calculation (min): 29 min Charges:  OT General Charges $OT Visit: 1 Visit OT Evaluation $OT Eval Moderate Complexity: 1 Mod OT Treatments $Self Care/Home Management : 8-22 mins   Brynn, OTR/L  Acute Rehabilitation Services Office: [phone] .   Jeri Modena 06/21/2021, 2:22 PM

## 2021-06-21 NOTE — Progress Notes (Signed)
[facility] Gastroenterology Progress Note  Mathew [name] 41 y.o. 1980-03-01  CC: Decompensated cirrhosis   Subjective: Seen and examined laying in bed.  Spanish interpreter used.  Patient aware of self, knows he is in the hospital.  Unable to name hospital or president.  His family notes he is talking slower and more somnolent than normal.  Patient denies pain, melena, hematochezia.  ROS : Review of Systems  Gastrointestinal:  Negative for abdominal pain, blood in stool, constipation, diarrhea, heartburn, melena, nausea and vomiting.  Genitourinary:  Negative for dysuria and urgency.  Neurological:  Positive for speech change.      Objective: Vital signs in last 24 hours: Vitals:   06/21/21 0320 06/21/21 0843  BP: 104/72 (!) 88/65  Pulse: 65 74  Resp: 18 16  Temp: 98.1 F (36.7 C) 98.1 F (36.7 C)  SpO2: 100% 98%    Physical Exam:  General:  Alert, cooperative, no distress, appears stated age, mild jaundice  Head:  Normocephalic, without obvious abnormality, atraumatic  Eyes:  Slightly icteric sclera, EOM's intact  Lungs:   Clear to auscultation bilaterally, respirations unlabored  Heart:  Regular rate and rhythm, S1, S2 normal  Abdomen:   Soft, non-tender, slightly distended, bowel sounds active all four quadrants,  no masses,   Extremities: Extremities normal, atraumatic, no  edema, slight asterixis  Pulses: 2+ and symmetric    Lab Results: Recent Labs    06/20/21 0128 06/21/21 0702  NA 132* 134*  K 4.3 3.5  CL 104 106  CO2 23 24  GLUCOSE 99 81  BUN 7 7  CREATININE 0.76 0.60*  CALCIUM 7.9* 7.7*  MG 2.0 1.7   Recent Labs    06/20/21 0128 06/21/21 0702  AST 43* 37  ALT 17 16  ALKPHOS 79 68  BILITOT 2.9* 2.7*  PROT 5.9* 5.1*  ALBUMIN 2.2* 1.9*   Recent Labs    06/19/21 0127 06/20/21 0128 06/21/21 0702  WBC 1.8* 3.4* 1.7*  NEUTROABS 0.9*  --  0.7*  HGB 8.8* 9.3* 8.6*  HCT 28.1* 29.3* 27.5*  MCV 96.2 95.4 95.5  PLT 37* 54* 37*   Recent  Labs    06/19/21 0127  LABPROT 24.8*  INR 2.3*      Assessment Decompensated liver cirrhosis due to alcohol abuse  HGB 8.6 Platelets 37 AST 37 ALT 16  Alkphos 68 TBili 2.7 GFR >60  INR 06/19/2021 2.3  MELD 3.0: 23 at 06/21/2021  7:02 AM MELD-Na: 22 at 06/21/2021  7:02 AM Calculated from: Serum Creatinine: 0.60 mg/dL (Using min of 1 mg/dL) at 06/21/2021  7:02 AM Serum Sodium: 134 mmol/L at 06/21/2021  7:02 AM Total Bilirubin: 2.7 mg/dL at 06/21/2021  7:02 AM Serum Albumin: 1.9 g/dL at 06/21/2021  7:02 AM INR(ratio): 2.3 at 06/19/2021  1:27 AM Age at listing (hypothetical): 41 years Sex: Male at 06/21/2021  7:02 AM    Hepatic encephalopathy  Patient is somnolent with slowed speech and slight asterixis.  Patient getting 20 g lactulose 3 times daily.  Ammonia level elevated at 40 yesterday, now 29 today.  Ascites  -CT abdomen/pelvis 06/16/2021 shows cirrhosis and mild splenomegaly with increased, moderately large volume ascites.  Wall thickening of the duodenum, proximal jejunum, and right colon which may reflect portal and or colopathy although enteritis is not excluded.  Bladder wall thickening, correlate for cystitis.  New small left pleural effusion.  Cholelithiasis.  S/p paracentesis with IR, 5 L of fluid removed, fluid analysis and cultures negative for SBP.  Esophageal varices  Patient had EGD/colon 03/17/2018 with Dr. Cristina Gong inpatient, with findings of mild portal gastropathy and grade 2 varices on upper endoscopy, friable mucosa on colonoscopy with 10-year recall recommended.  Plan: Continue folic acid, multivitamin, thiamine Continue 20 g lactulose 3 times daily.  We will start Xifaxan 550 mg 2 times daily for hepatic encephalopathy. Continue frusemide 40 mg daily and spironolactone 100 mg daily for ascites Continue to monitor hemoglobin we will transfuse for hemoglobin less than 7. [facility] will follow.   Mathew Nigh Demetric Parslow PA-C 06/21/2021, 1:19 PM  Contact #   [phone]

## 2021-06-21 NOTE — TOC Initial Note (Addendum)
Transition of Care [facility]) - Initial/Assessment Note    Patient Details  Name: Mathew [name] MRN: [id number] Date of Birth: [birth year]  Transition of Care [facility]) CM/SW Contact:    Kingsley Plan, RN Phone Number: 06/21/2021, 1:42 PM  Clinical Narrative:                 Spoke to patient's father Alejendro at bedside with assistance of [employer] Language Trilby Leaver [phone] .   Confirmed face sheet information.   Patient from home with friends.   PCP is Dr [name] at [facility].   Called , first available with Dr [name] is August 17, 2021 at 0830. [facility] will put patient on call list, if they have cancellation they will call patient to schedule sooner. Information placed on AVS   They would like scripts to go to [facility] pharmacy at discharge. Father understands [facility] will call with cost. Hospital can only assit with medications one time per year. Patient can use [facility] also . Changed to [facility]   Has transportation to appointments   WIll call for   Expected Discharge Plan: Home/Self Care     Patient Goals and CMS Choice Patient states their goals for this hospitalization and ongoing recovery are:: to return to home CMS Medicare.gov Compare Post Acute Care list provided to:: Patient    Expected Discharge Plan and Services Expected Discharge Plan: Home/Self Care In-house Referral: Financial Counselor Discharge Planning Services: CM Consult, Indigent Health Clinic, [facility], Medication Assistance   Living arrangements for the past 2 months: Single Family Home                   DME Agency: NA       HH Arranged: NA          Prior Living Arrangements/Services Living arrangements for the past 2 months: Single Family Home Lives with:: Roommate Patient language and need for interpreter reviewed:: Yes ([employer] Language Trilby Leaver [phone]) Do you feel safe going back to the place where you live?: Yes      Need for Family  Participation in Patient Care: Yes (Comment) Care giver support system in place?: Yes (comment)   Criminal Activity/Legal Involvement Pertinent to Current Situation/Hospitalization: No - Comment as needed  Activities of Daily Living Home Assistive Devices/Equipment: None ADL Screening (condition at time of admission) Patient's cognitive ability adequate to safely complete daily activities?: Yes Is the patient deaf or have difficulty hearing?: No Does the patient have difficulty seeing, even when wearing glasses/contacts?: No Does the patient have difficulty concentrating, remembering, or making decisions?: No Patient able to express need for assistance with ADLs?: Yes Does the patient have difficulty dressing or bathing?: No Independently performs ADLs?: Yes (appropriate for developmental age) Does the patient have difficulty walking or climbing stairs?: No Weakness of Legs: Both Weakness of Arms/Hands: None  Permission Sought/Granted   Permission granted to share information with : Yes, Verbal Permission Granted  Share Information with NAME: father Alejendro           Emotional Assessment Appearance:: Appears stated age     Orientation: : Oriented to Self Alcohol / Substance Use: Alcohol Use Psych Involvement: No (comment)  Admission diagnosis:  SBP (spontaneous bacterial peritonitis) (HCC) [K65.2] Alcoholic cirrhosis of liver with ascites (HCC) [K70.31] Decompensated hepatic cirrhosis (HCC) [K72.90, K74.60] Patient Active Problem List   Diagnosis Date Noted   Acute metabolic encephalopathy 06/20/2021   Pancytopenia (HCC)    Tobacco abuse  Decompensated hepatic cirrhosis (HCC) 06/17/2021   SBP (spontaneous bacterial peritonitis) (HCC) 06/17/2021   Umbilical hernia without obstruction and without gangrene 11/26/2020   Tobacco use 05/12/2020   Vitamin D deficiency 02/03/2020   Edema due to hypoalbuminemia 12/20/2019   Alcoholic cirrhosis of liver with ascites (HCC)  03/13/2018   ETOH abuse 07/09/2016   Microcytic anemia 07/09/2016   Thrombocytopenia (HCC) 07/09/2016   PCP:  [name], MD Pharmacy:   [facility] [phone] - [place], [place] - [address] AT [facility] [address] [facility] [address] [place] [address] Phone: [phone] Fax: [phone]  [name] [address] [place] [address] Phone: [phone] Fax: [phone]  [facility] [address] [place] [address] Phone: [phone] Fax: [phone]  [name] [address] [place] [address] Phone: [phone] Fax: [phone]     Social Determinants of Health (SDOH) Interventions    Readmission Risk Interventions     No data to display

## 2021-06-21 NOTE — Progress Notes (Signed)
PROGRESS NOTE    Mathew [name] [place]  [id number] DOB: 1980-07-04 DOA: 06/16/2021 PCP: [name], MD   Chief Complaint  Patient presents with   Abdominal Pain    Brief Narrative:  Patient is a 41 year old gentleman with a history of alcoholic cirrhosis with recurrent ascites, known umbilical hernia, ongoing alcohol use presenting to the ED with worsening abdominal swelling and pain.  It is noted that paracentesis was arranged in the outpatient setting however patient presented to the ED instead due to ongoing symptoms.  Patient with complaints of diffuse abdominal pain, noted to have umbilical hernia that was reducible and 2+ lower extremity edema.  Patient underwent paracentesis by IR with removal of approximately 5 L of IV fluid and placed empirically on IV antibiotics due to concern for possible SBP.  GI consulted for further evaluation and management of mildly decompensated alcoholic cirrhosis -Patient noted on 06/20/2021 to be confused, concern for hepatic encephalopathy.   Assessment & Plan:   Principal Problem:   Decompensated hepatic cirrhosis (HCC) Active Problems:   Acute metabolic encephalopathy   ETOH abuse   Microcytic anemia   Thrombocytopenia (HCC)   Alcoholic cirrhosis of liver with ascites (HCC)   Edema due to hypoalbuminemia   Umbilical hernia without obstruction and without gangrene   SBP (spontaneous bacterial peritonitis) (HCC)   Pancytopenia (HCC)   Tobacco abuse  #1 acute abdominal pain secondary to massive ascites and concern for SBP -Patient presented with abdominal pain felt secondary to massive ascites contributing to his pain and also concern for SBP. -Patient underwent ultrasound-guided paracentesis by IR with 5.4 L of clear yellow fluid removed with no immediate complications. -Fluid sent for cultures and Gram stain currently pending with no growth to date. -Patient improving clinically. -Cultures negative with no growth to date.    -Discontinue IV Rocephin.  -Continue PPI. -Continue lactulose, Lasix, spironolactone. -GI consulted and following and appreciate input and recommendations.  2.  Mildly decompensated alcoholic cirrhosis -Patient presented massive ascites, abdominal distention and pain, total bilirubin noted to have increased to 3.6 from baseline of 2.3 on admission. -INR at 2.3 (06/19/2021) -Patient also noted with a pancytopenia. -??  Compliance with medications. -Compliance stressed to patient. -Patient confused this morning, concern for possible hepatic encephalopathy. -Continue current regimen of Lasix 40 mg daily, spironolactone 100 mg daily. -Increased lactulose to 20 mg 3 times daily.   -Blood pressure soft/borderline and as such we will decrease Haldol to 5 mg twice daily. -Alcohol cessation stressed to patient. -Discontinue IV Rocephin. -GI following and appreciate input and recommendations.  3.  Acute metabolic encephalopathy/hepatic encephalopathy -Patient noted to be confused the morning of 06/20/2021.  -Patient trying to bite at telemetry leads.  -Patient noted per RN to be pacing the hallways all night and having hallucinations of seeing his children in the hallways. -Patient with some clinical improvement. -Concern for hepatic encephalopathy. -Ammonia levels trending down.   -Continue lactulose 20 g 3 times daily.  -Xifaxan added per gastroenterology.  -Continue the Ativan withdrawal protocol. -Supportive care.  4.  Grade 2 varices -Patient with no hematemesis or hematochezia. -Patient with alcoholic cirrhosis. -Continue PPI. -Patient started on nadolol. -Blood pressure soft/borderline. -We will decrease nadolol to 5 mg twice daily and if blood pressure still soft may need to discontinue nadolol.  5.  Painful umbilical hernia -Patient with some complaints of umbilical hernia noted to be distended but soft and easily reducible. -CT abdomen and pelvis done unremarkable with no  incarceration or  obstructive process noted. -Patient with no significant abdominal pain today. -Outpatient follow-up with general surgery.  6.  Systolic murmur -Noted per admitting physician. -2D echo done with EF of 55 to 60%, no wall motion abnormalities, normal right ventricular systolic function.  Severely dilated left atrial size. -Follow.  7.  Pancytopenia -Likely secondary to alcoholic cirrhosis. -Patient with no overt bleeding. -Anemia panel with iron level of 18, TIBC of 185, ferritin of 14, folate of 10.9.  Vitamin B12 levels of 806. -Counts fluctuating. -Follow H&H. -Transfusion threshold hemoglobin < 7.  8.  Tobacco abuse -Tobacco cessation.  9.  History of alcohol abuse -Alcohol cessation stressed to patient. -No significant withdrawal noted. -Patient however with confusion on 06/20/2021 with some hallucinations felt likely secondary to hepatic encephalopathy.  -Continue the Ativan withdrawal protocol, thiamine, folic acid, multivitamin.  10.  Hypomagnesemia -Magnesium level of 1.7 -Likely secondary to ongoing alcohol abuse. -Replete with goal to keep magnesium approximately 2. -Repeat labs in the AM.   DVT prophylaxis: SCDs Code Status: Full Family Communication: Updated patient and family at bedside via interpreter.  Updated family. Disposition: Home when clinically improved, with improvement with confusion hopefully in the next 1 to 2 days.  Status is: Inpatient Remains inpatient appropriate because: Severity of illness   Consultants:  GI: Dr. Watt Climes 06/18/2021  Procedures:  Ultrasound-guided paracentesis per IR 06/17/2021 with 5.4 L of clear yellow fluid removed, [name], PA Chest x-ray 06/17/2021 CT abdomen and pelvis 06/16/2021 2D echo 06/17/2021     Antimicrobials:  IV Rocephin 06/17/2021 >>>>> 06/20/2021    Subjective: Patient laying in bed.  Less confused today.  Denies any chest pain.  No shortness of breath.  No abdominal pain.  Per RN still  with no bowel movement.  Denies any further hallucinations.  Aunt and father at bedside.   Objective: Vitals:   06/20/21 1728 06/20/21 1943 06/21/21 0320 06/21/21 0843  BP: (!) 107/59 108/67 104/72 (!) 88/65  Pulse: 64 61 65 74  Resp: 16 17 18 16   Temp: 98.7 F (37.1 C) 97.6 F (36.4 C) 98.1 F (36.7 C) 98.1 F (36.7 C)  TempSrc: Oral Oral Oral Oral  SpO2: 100% 100% 100% 98%  Weight:   77.2 kg   Height:       No intake or output data in the 24 hours ending 06/21/21 1111  Filed Weights   06/19/21 0428 06/20/21 0256 06/21/21 0320  Weight: 78.9 kg 79.9 kg 77.2 kg    Examination:  General exam: NAD.  Less confused. Respiratory system: Lungs clear to auscultation bilaterally.  No wheezes, no crackles, no rhonchi.  Fair air movement.  Speaking in full sentences. Cardiovascular system: RRR no murmurs rubs or gallops.  No JVD.  No lower extremity edema.   Gastrointestinal system: Abdomen is soft, nontender, nondistended, positive bowel sounds.  No rebound.  No guarding.  Reducible umbilical hernia.  Central nervous system: Alert to self place and time.  Less confused.  Moving extremities spontaneously.  Extremities: Symmetric 5 x 5 power. Skin: No rashes, lesions or ulcers Psychiatry: Judgement and insight appear poor-fair. Mood & affect somewhat flat .    Data Reviewed: I have personally reviewed following labs and imaging studies  CBC: Recent Labs  Lab 06/15/21 1457 06/16/21 1407 06/18/21 0723 06/19/21 0127 06/20/21 0128 06/21/21 0702  WBC 1.8* 1.6* 1.3* 1.8* 3.4* 1.7*  NEUTROABS 0.8* 0.8*  --  0.9*  --  0.7*  HGB 9.4* 10.1* 8.9* 8.8* 9.3* 8.6*  HCT 28.4*  33.2* 26.7* 28.1* 29.3* 27.5*  MCV 87 95.7 92.7 96.2 95.4 95.5  PLT 34* 39* 31* 37* 54* 37*     Basic Metabolic Panel: Recent Labs  Lab 06/16/21 1407 06/17/21 0931 06/18/21 0723 06/19/21 0127 06/20/21 0128 06/21/21 0702  NA 138  --  133* 134* 132* 134*  K 3.5  --  3.7 4.0 4.3 3.5  CL 112*  --  107 107  104 106  CO2 22  --  19* 22 23 24   GLUCOSE 135*  --  81 81 99 81  BUN <5*  --  <5* <5* 7 7  CREATININE 0.58*  --  0.51* 0.59* 0.76 0.60*  CALCIUM 7.9*  --  7.7* 7.8* 7.9* 7.7*  MG  --  1.7 1.6* 1.8 2.0 1.7  PHOS  --  3.6  --   --   --   --      GFR: Estimated Creatinine Clearance: 114.2 mL/min (A) (by C-G formula based on SCr of 0.6 mg/dL (L)).  Liver Function Tests: Recent Labs  Lab 06/16/21 1407 06/18/21 0723 06/19/21 0127 06/20/21 0128 06/21/21 0702  AST 73* 46* 45* 43* 37  ALT 25 17 17 17 16   ALKPHOS 112 79 68 79 68  BILITOT 3.6* 3.7* 3.5* 2.9* 2.7*  PROT 6.6 5.4* 5.6* 5.9* 5.1*  ALBUMIN 2.3* 2.0* 2.0* 2.2* 1.9*     CBG: No results for input(s): "GLUCAP" in the last 168 hours.   Recent Results (from the past 240 hour(s))  Urine Culture     Status: Abnormal   Collection Time: 06/17/21  8:27 AM   Specimen: Urine, Clean Catch  Result Value Ref Range Status   Specimen Description URINE, CLEAN CATCH  Final   Special Requests NONE  Final   Culture (A)  Final    <10,000 COLONIES/mL INSIGNIFICANT GROWTH Performed at [facility], [address] [place], [place] [address]    Report Status 06/18/2021 FINAL  Final  Culture, body fluid w Gram Stain-bottle     Status: None (Preliminary result)   Collection Time: 06/17/21 12:06 PM   Specimen: Peritoneal Washings  Result Value Ref Range Status   Specimen Description PERITONEAL FLUID ABDOMEN  Final   Special Requests NONE  Final   Culture   Final    NO GROWTH 4 DAYS Performed at [facility] [address], [place], [place] [address]    Report Status PENDING  Incomplete  Gram stain     Status: None   Collection Time: 06/17/21 12:06 PM   Specimen: Peritoneal Washings  Result Value Ref Range Status   Specimen Description PERITONEAL FLUID ABDOMEN  Final   Special Requests NONE  Final   Gram Stain   Final    WBC PRESENT, PREDOMINANTLY MONONUCLEAR NO ORGANISMS SEEN CYTOSPIN SMEAR Performed at [facility], [place] [address], [place], [address] [address]    Report Status 06/17/2021 FINAL  Final         Radiology Studies: No results found.      Scheduled Meds:  folic acid  1 mg Oral Daily   furosemide  40 mg Intravenous Once   furosemide  40 mg Oral Daily   lactulose  20 g Oral TID   multivitamin with minerals  1 tablet Oral Daily   pantoprazole  40 mg Oral Q0600   propranolol  10 mg Oral BID   sodium chloride flush  3 mL Intravenous Q12H   spironolactone  100 mg  Oral Daily   thiamine  100 mg Oral Daily   Or   thiamine  100 mg Intravenous Daily   Continuous Infusions:  sodium chloride     cefTRIAXone (ROCEPHIN)  IV 2 g (06/21/21 0905)   magnesium sulfate bolus IVPB       LOS: 4 days    Time spent: 40 minutes    Irine Seal, MD [facility]   To contact the attending provider between 7A-7P or the covering provider during after hours 7P-7A, please log into the web site [email or web address] and access using universal [place] password for that web site. If you do not have the password, please call the hospital operator.  06/21/2021, 11:11 AM

## 2021-06-21 NOTE — Evaluation (Signed)
Physical Therapy Evaluation Patient Details Name: Mathew [name] MRN: [id number] DOB: 1980/08/03 Today's Date: 06/21/2021  History of Present Illness  Pt is a 41 y.o. M who presents 06/16/2021 with worsening abdominal pain and swelling. Pt underwent paracentesis by IR. Also noted to be confused with concern for hepatic encephalopathy. Significant PMH: ETOH abuse, tobacco abuse, alcoholic cirrhosis with recurrent ascites.  Clinical Impression  PTA, pt with housing insecurity and is independent. Pt family members report pt cognition is improving. Pt presents with deficits in awareness, problem solving, and memory in addition to impaired balance. Pt ambulating 540 ft with no assistive device; requires up to moderate assist due to numerous episodes of loss of balance. Negotiated 5 steps with a railing and min assist. Suspect pt will continue to improve. Will follow acutely to address deficits.     Recommendations for follow up therapy are one component of a multi-disciplinary discharge planning process, led by the attending physician.  Recommendations may be updated based on patient status, additional functional criteria and insurance authorization.  Follow Up Recommendations No PT follow up    Assistance Recommended at Discharge Frequent or constant Supervision/Assistance  Patient can return home with the following  A little help with walking and/or transfers;Direct supervision/assist for medications management;Assist for transportation;Help with stairs or ramp for entrance    Equipment Recommendations None recommended by PT  Recommendations for Other Services       Functional Status Assessment Patient has had a recent decline in their functional status and demonstrates the ability to make significant improvements in function in a reasonable and predictable amount of time.     Precautions / Restrictions Precautions Precautions: Fall Restrictions Weight Bearing Restrictions: No       Mobility  Bed Mobility Overal bed mobility: Modified Independent                  Transfers Overall transfer level: Needs assistance Equipment used: None Transfers: Sit to/from Stand Sit to Stand: Min guard                Ambulation/Gait Ambulation/Gait assistance: Min guard, Mod assist Gait Distance (Feet): 540 Feet Assistive device: None Gait Pattern/deviations: Step-through pattern, Decreased stride length, Drifts right/left Gait velocity: decreased     General Gait Details: consistent cueing provided for obstacle negotiation, clearing feet, and stopping to regain balance. Pt with numerous episodes of gross loss of balance, requiring up to modA to correct  Stairs Stairs: Yes Stairs assistance: Min assist, +2 safety/equipment Stair Management: One rail Right Number of Stairs: 5 General stair comments: cues for step by step and keeping feet fully on step, minA for balance  Wheelchair Mobility    Modified Rankin (Stroke Patients Only)       Balance Overall balance assessment: Needs assistance Sitting-balance support: Feet supported Sitting balance-Leahy Scale: Good Sitting balance - Comments: donning shoes EOB   Standing balance support: No upper extremity supported, During functional activity Standing balance-Leahy Scale: Fair Standing balance comment: fair statically                             Pertinent Vitals/Pain Pain Assessment Pain Assessment: No/denies pain    Home Living Family/patient expects to be discharged to:: Shelter/Homeless                   Additional Comments: Pt has been living with a friend, who also has ETOH abuse. No stable housing situation currently. Pt  family (aunt, friend, father) present on evaluation    Prior Function Prior Level of Function : Independent/Modified Independent             Mobility Comments: Not working       Higher education careers adviser        Extremity/Trunk Assessment   Upper  Extremity Assessment Upper Extremity Assessment: Defer to OT evaluation    Lower Extremity Assessment Lower Extremity Assessment: Overall WFL for tasks assessed    Cervical / Trunk Assessment Cervical / Trunk Assessment: Normal  Communication   Communication: Prefers language other than [place] (Spanish speaking)  Cognition Arousal/Alertness: Awake/alert Behavior During Therapy: Flat affect Overall Cognitive Status: Impaired/Different from baseline Area of Impairment: Memory, Safety/judgement, Awareness, Problem solving                     Memory: Decreased short-term memory   Safety/Judgement: Decreased awareness of safety, Decreased awareness of deficits Awareness: Intellectual Problem Solving: Requires verbal cues General Comments: Pt family members report pt with improving cognition today. Pt following all one step commands. Demonstrates poor awareness and recognition of deficits; states his balance is "good," although experienced numerous episodes of gross LOB without self righting strategies. Pt also with urinary incontinence during session        General Comments      Exercises     Assessment/Plan    PT Assessment Patient needs continued PT services  PT Problem List Decreased strength;Decreased activity tolerance;Decreased mobility;Decreased balance;Decreased cognition;Decreased safety awareness       PT Treatment Interventions Therapeutic activities;Gait training;Stair training;Functional mobility training;Therapeutic exercise;Balance training;Patient/family education    PT Goals (Current goals can be found in the Care Plan section)  Acute Rehab PT Goals Patient Stated Goal: did not state PT Goal Formulation: With patient Time For Goal Achievement: 07/05/21 Potential to Achieve Goals: Good    Frequency Min 3X/week     Co-evaluation               AM-PAC PT "6 Clicks" Mobility  Outcome Measure Help needed turning from your back to your side  while in a flat bed without using bedrails?: None Help needed moving from lying on your back to sitting on the side of a flat bed without using bedrails?: None Help needed moving to and from a bed to a chair (including a wheelchair)?: A Little Help needed standing up from a chair using your arms (e.g., wheelchair or bedside chair)?: A Little Help needed to walk in hospital room?: A Lot Help needed climbing 3-5 steps with a railing? : A Little 6 Click Score: 19    End of Session Equipment Utilized During Treatment: Gait belt Activity Tolerance: Patient tolerated treatment well Patient left: in bed;with call bell/phone within reach;with family/visitor present;Other (comment) (with OT) Nurse Communication: Mobility status PT Visit Diagnosis: Unsteadiness on feet (R26.81)    Time: 5400-8676 PT Time Calculation (min) (ACUTE ONLY): 26 min   Charges:   PT Evaluation $PT Eval Low Complexity: 1 Low PT Treatments $Gait Training: 8-22 mins        Lillia Pauls, PT, DPT Acute Rehabilitation Services Pager [phone] Office [phone]   Norval Morton 06/21/2021, 1:00 PM

## 2021-06-22 ENCOUNTER — Other Ambulatory Visit: Payer: Self-pay

## 2021-06-22 ENCOUNTER — Other Ambulatory Visit (HOSPITAL_COMMUNITY): Payer: Self-pay

## 2021-06-22 LAB — CBC WITH DIFFERENTIAL/PLATELET
Abs Immature Granulocytes: 0 10*3/uL (ref 0.00–0.07)
Basophils Absolute: 0 10*3/uL (ref 0.0–0.1)
Basophils Relative: 0 %
Eosinophils Absolute: 0.1 10*3/uL (ref 0.0–0.5)
Eosinophils Relative: 2 %
HCT: 28.3 % — ABNORMAL LOW (ref 39.0–52.0)
Hemoglobin: 8.8 g/dL — ABNORMAL LOW (ref 13.0–17.0)
Immature Granulocytes: 0 %
Lymphocytes Relative: 22 %
Lymphs Abs: 0.7 10*3/uL (ref 0.7–4.0)
MCH: 29.5 pg (ref 26.0–34.0)
MCHC: 31.1 g/dL (ref 30.0–36.0)
MCV: 95 fL (ref 80.0–100.0)
Monocytes Absolute: 0.5 10*3/uL (ref 0.1–1.0)
Monocytes Relative: 17 %
Neutro Abs: 1.8 10*3/uL (ref 1.7–7.7)
Neutrophils Relative %: 59 %
Platelets: 51 10*3/uL — ABNORMAL LOW (ref 150–400)
RBC: 2.98 MIL/uL — ABNORMAL LOW (ref 4.22–5.81)
RDW: 17.6 % — ABNORMAL HIGH (ref 11.5–15.5)
WBC: 3 10*3/uL — ABNORMAL LOW (ref 4.0–10.5)
nRBC: 0 % (ref 0.0–0.2)

## 2021-06-22 LAB — COMPREHENSIVE METABOLIC PANEL
ALT: 16 U/L (ref 0–44)
AST: 40 U/L (ref 15–41)
Albumin: 2 g/dL — ABNORMAL LOW (ref 3.5–5.0)
Alkaline Phosphatase: 82 U/L (ref 38–126)
Anion gap: 4 — ABNORMAL LOW (ref 5–15)
BUN: 10 mg/dL (ref 6–20)
CO2: 22 mmol/L (ref 22–32)
Calcium: 8 mg/dL — ABNORMAL LOW (ref 8.9–10.3)
Chloride: 105 mmol/L (ref 98–111)
Creatinine, Ser: 0.73 mg/dL (ref 0.61–1.24)
GFR, Estimated: >60 mL/min (ref >60)
Glucose, Bld: 101 mg/dL — ABNORMAL HIGH (ref 70–99)
Potassium: 4.1 mmol/L (ref 3.5–5.1)
Sodium: 131 mmol/L — ABNORMAL LOW (ref 135–145)
Total Bilirubin: 2.2 mg/dL — ABNORMAL HIGH (ref 0.3–1.2)
Total Protein: 5.6 g/dL — ABNORMAL LOW (ref 6.5–8.1)

## 2021-06-22 LAB — CULTURE, BODY FLUID W GRAM STAIN -BOTTLE: Culture: NO GROWTH

## 2021-06-22 LAB — MAGNESIUM: Magnesium: 1.7 mg/dL (ref 1.7–2.4)

## 2021-06-22 LAB — TOTAL BILIRUBIN, BODY FLUID: Total bilirubin, fluid: 0.4 mg/dL

## 2021-06-22 MED ORDER — SPIRONOLACTONE 100 MG PO TABS
100.0000 mg | ORAL_TABLET | Freq: Every day | ORAL | 1 refills | Status: DC
  Filled 2021-06-22: qty 30, 30d supply, fill #0

## 2021-06-22 MED ORDER — ADULT MULTIVITAMIN W/MINERALS CH: 1.0000 | ORAL_TABLET | Freq: Every day | ORAL | Status: DC

## 2021-06-22 MED ORDER — PANTOPRAZOLE SODIUM 40 MG PO TBEC
40.0000 mg | DELAYED_RELEASE_TABLET | Freq: Every day | ORAL | 1 refills | Status: DC
  Filled 2021-06-22: qty 30, 30d supply, fill #0

## 2021-06-22 MED ORDER — RIFAXIMIN 550 MG PO TABS
550.0000 mg | ORAL_TABLET | Freq: Two times a day (BID) | ORAL | 1 refills | Status: DC
  Filled 2021-06-22: qty 60, 30d supply, fill #0

## 2021-06-22 MED ORDER — PROPRANOLOL HCL 10 MG PO TABS
5.0000 mg | ORAL_TABLET | Freq: Two times a day (BID) | ORAL | 1 refills | Status: DC
  Filled 2021-06-22: qty 60, 60d supply, fill #0

## 2021-06-22 MED ORDER — LACTULOSE 10 GM/15ML PO SOLN
30.0000 g | Freq: Three times a day (TID) | ORAL | Status: DC
  Administered 2021-06-22: 30 g via ORAL
  Filled 2021-06-22: qty 45

## 2021-06-22 MED ORDER — MAGNESIUM SULFATE 4 GM/100ML IV SOLN
4.0000 g | Freq: Once | INTRAVENOUS | Status: AC
  Administered 2021-06-22: 4 g via INTRAVENOUS
  Filled 2021-06-22: qty 100

## 2021-06-22 MED ORDER — LACTULOSE 10 GM/15ML PO SOLN
30.0000 g | Freq: Three times a day (TID) | ORAL | 2 refills | Status: DC
  Filled 2021-06-22: qty 946, 7d supply, fill #0

## 2021-06-22 MED ORDER — FUROSEMIDE 40 MG PO TABS
40.0000 mg | ORAL_TABLET | Freq: Every day | ORAL | 1 refills | Status: DC
  Filled 2021-06-22: qty 30, 30d supply, fill #0

## 2021-06-22 NOTE — TOC Progression Note (Signed)
Transition of Care [facility]) - Progression Note    Patient Details  Name: Mathew [name] MRN: [id number] Date of Birth: 04-08-1980  Transition of Care [facility]) CM/SW [place], [place] Phone Number: 06/22/2021, 12:16 PM  Clinical Narrative:     CSW met with pt and family at bedside. Medical team noted there is a dialect issue causing miscommunication. Family friend was at bedside that pt stated could translate for him. CSW provided resources for SU assistance, pt noted he goes OP. He was unable to confirm the name of the facility, but notes he knows how to get there. Pt states he will return home with friends where he will rent a room and the friends don't drink. He states someone will be home to help him get around. Family friend noted she will transport him home. All Questions answered. TOC will continue to be available for further needs.  Expected Discharge Plan: Home/Self Care    Expected Discharge Plan and Services Expected Discharge Plan: Home/Self Care In-house Referral: Financial Counselor Discharge Planning Services: CM Consult, [facility], [place], Medication Assistance   Living arrangements for the past 2 months: Home                   DME Agency: NA       HH Arranged: NA           Social Determinants of Health (SDOH) Interventions    Readmission Risk Interventions     No data to display

## 2021-06-22 NOTE — Progress Notes (Signed)
Mobility Specialist Progress Note:   06/22/21 1425  Mobility  Activity Ambulated with assistance to bathroom  Level of Assistance Moderate assist, patient does 50-74%  Assistive Device Other (Comment) (HHA)  Distance Ambulated (ft) 30 ft  Activity Response Tolerated well  $Mobility charge 1 Mobility   Pt received in bed, saturated in urine. Ambulated to BR with no AD, up to modA to correct frequent LOBs. Pt cleaned up, linens changed with assistance from NT and RN. Back in bed with all needs met, bed alarm on.   [facility] Secure Chat or Office Phone: [phone]

## 2021-06-22 NOTE — Discharge Summary (Signed)
Physician Discharge Summary  Mathew [name] [place] LNL:[id number] DOB: [birth year] DOA: 06/16/2021  PCP: Mathew Frisk, MD  Admit date: 06/16/2021 Discharge date: 06/22/2021  Time spent: 60 minutes  Recommendations for Outpatient Follow-up:  Follow-up with Mathew Frisk, MD on August 17, 2021 at 8:30 AM.  On follow-up patient will need a comprehensive metabolic profile done to follow-up on electrolytes, renal function, LFTs.  Patient need a CBC done to follow-up on counts.  Umbilical hernia will need to be followed up upon. Follow-up with Celso Amy, PA, [facility] gastroenterology on July 14 2021 at 3:30 PM.   Discharge Diagnoses:  Principal Problem:   Decompensated hepatic cirrhosis (HCC) Active Problems:   Acute metabolic encephalopathy   ETOH abuse   Microcytic anemia   Thrombocytopenia (HCC)   Alcoholic cirrhosis of liver with ascites (HCC)   Edema due to hypoalbuminemia   Umbilical hernia without obstruction and without gangrene   SBP (spontaneous bacterial peritonitis) (HCC)   Pancytopenia (HCC)   Tobacco abuse   Discharge Condition: Stable and improved.  Diet recommendation: Heart healthy  Filed Weights   06/19/21 0428 06/20/21 0256 06/21/21 0320  Weight: 78.9 kg 79.9 kg 77.2 kg    History of present illness:  HPI per Dr. [name] [facility] Mathew [name] is a 41 y.o. male with medical history significant of alcohol-related cirrhosis, recurrent ascites, nonadherence to medical therapy and umbilical hernia.  In regards to the hernia patient has been evaluated by surgery but unless incarcerated and needs urgent surgery he is not an elective surgical candidate.  Patient follows with the [facility] by [name].  He has been seen recently in the office (06/15/2021) and plans were made to proceed with outpatient paracentesis.  Patient has developed increasing abdominal pain and was sent to the ER for evaluation   In the ER he was noted to have  significant abdominal discomfort, massive ascites, total bilirubin up to 3.6 which is higher than baseline of 2.3.  Hospitalist service was asked to admit this patient for possible SBP and therapeutic/diagnostic paracentesis.   The patient reports he has had diffuse abdominal pain for several weeks which has worsened over the past several days.  He states that he drinks about 2 beers per day but not daily. A few days ago he attempted to drink a beer but it made him vomit.  He denies any GI symptoms such as blood or dark emesis.  He states his bowel movements are normal.  No change in urinary output or ability to void.  No shortness of breath or chest pain.  Of note patient did undergo an EGD in 2020 which showed mild portal gastropathy and grade 2 varices.  Patient was currently reporting that he is hungry.   Hospital Course:  #1 acute abdominal pain secondary to massive ascites and concern for SBP -Patient presented with abdominal pain felt secondary to massive ascites contributing to his pain and also concern for SBP. -Patient underwent ultrasound-guided paracentesis by IR with 5.4 L of clear yellow fluid removed with no immediate complications. -Fluid sent for cultures and Gram stain with no organisms noted.  -Patient initially placed on IV Rocephin, PPI, lactulose, Lasix, spironolactone . -Patient improved clinically, IV Rocephin subsequently discontinued.   -Patient seen by GI during the hospitalization and will follow-up with GI in the outpatient setting.    2.  Mildly decompensated alcoholic cirrhosis -Patient presented with massive ascites, abdominal distention and pain, total bilirubin noted to  have increased to 3.6 from baseline of 2.3 on admission. -INR at 2.3 (06/19/2021) -Patient also noted with a pancytopenia. -??  Compliance with medications. -Compliance stressed to patient. -Patient confused during the hospitalization, concern for possible hepatic encephalopathy. -Patient maintained  on Lasix 40 mg daily, spironolactone 100 mg daily, lactulose, now normal started, Xifaxan.   -Alcohol cessation stressed to patient.   -Patient initially placed on IV Rocephin pending paracentesis results which were negative for any organisms and as such IV Rocephin subsequently discontinued.   -Patient seen in consultation by GI will follow the patient throughout the hospitalization.   -Outpatient follow-up with GI.   3.  Acute metabolic encephalopathy/hepatic encephalopathy -Patient noted to be confused the morning of 06/20/2021.  -Patient trying to bite at telemetry leads.  -Patient noted per RN to be pacing the hallways all night and having hallucinations of seeing his children in the hallways. -Confusion likely secondary to hepatic encephalopathy. -Ammonia levels noted to be elevated. -Patient placed on lactulose and dose adjusted to 30 g 3 times daily, Xifaxan added per gastroenterology. -Patient improved clinically and will be discharged in stable and improved condition on lactulose and Xifaxan. -Outpatient follow-up with GI/PCP.  4.  Grade 2 varices -Patient with no hematemesis or hematochezia. -Patient with alcoholic cirrhosis. -Patient maintained on PPI and started on nadolol and dose decreased due to soft borderline blood pressure.   -Outpatient follow-up with PCP/GI.    5.  Painful umbilical hernia -Patient with some complaints of umbilical hernia noted to be distended but soft and easily reducible. -CT abdomen and pelvis done unremarkable with no incarceration or obstructive process noted. -Patient with no significant abdominal pain post paracentesis. -Outpatient follow-up with PCP.  6.  Systolic murmur -Noted per admitting physician. -2D echo done with EF of 55 to 60%, no wall motion abnormalities, normal right ventricular systolic function.  Severely dilated left atrial size. -Outpatient follow-up with PCP.  7.  Pancytopenia -Likely secondary to alcoholic  cirrhosis. -Patient with no overt bleeding. -Anemia panel with iron level of 18, TIBC of 185, ferritin of 14, folate of 10.9.  Vitamin B12 levels of 806. -Counts fluctuating however were improving by day of discharge. -Outpatient follow-up with PCP.  8.  Tobacco abuse -Tobacco cessation stressed to patient.  9.  History of alcohol abuse -Alcohol cessation stressed to patient. -No significant withdrawal noted. -Patient however with confusion on 06/20/2021 with some hallucinations felt likely secondary to hepatic encephalopathy.  -Patient maintained on Ativan withdrawal protocol, thiamine, folic acid, multivitamin.   -Alcohol cessation stressed to patient.   -TOC consulted to provide information on alcohol rehab to patient and family.   10.  Hypomagnesemia -Magnesium level of 1.7 -Likely secondary to ongoing alcohol abuse. -Magnesium repleted.       Procedures: Ultrasound-guided paracentesis per IR 06/17/2021 with 5.4 L of clear yellow fluid removed, [name], PA Chest x-ray 06/17/2021 CT abdomen and pelvis 06/16/2021 2D echo 06/17/2021  Consultations: GI: Dr. Watt Climes 06/18/2021    Discharge Exam: Vitals:   06/22/21 0354 06/22/21 0901  BP: 110/70 115/78  Pulse: 76 84  Resp: 18 16  Temp: 99.1 F (37.3 C) 98.1 F (36.7 C)  SpO2: 99% 100%    General: NAD Cardiovascular: Regular rate rhythm no murmurs rubs or gallops.  No JVD.  No lower extremity edema. Respiratory: Clear to auscultation bilaterally.  No wheezes, no crackles, no rhonchi.  Fair air movement.  Speaking in full sentences.  Discharge Instructions   Discharge Instructions  Ambulatory referral to Occupational Therapy   Complete by: As directed    Ambulatory referral to Occupational Therapy   Complete by: As directed    Diet - low sodium heart healthy   Complete by: As directed    Discharge instructions   Complete by: As directed    STOP DRINKING ALCOHOL   Increase activity slowly   Complete by: As  directed    Increase activity slowly   Complete by: As directed       Allergies as of 06/22/2021   No Known Allergies      Medication List     STOP taking these medications    famotidine 20 MG tablet Commonly known as: PEPCID       TAKE these medications    acetaminophen 500 MG tablet Commonly known as: TYLENOL Take 1 tablet (500 mg total) by mouth every 6 (six) hours as needed for mild pain. Do not exceed more than 2000 mg a day   ferrous sulfate 325 (65 FE) MG tablet Take 1 tablet (325 mg total) by mouth 3 (three) times daily with meals.   folic acid 1 MG tablet Commonly known as: FOLVITE Tome 1 tableta (1 mg en total) por va oral diariamente. (Take 1 tablet (1 mg total) by mouth daily.)   furosemide 40 MG tablet Commonly known as: LASIX Tome 1 tableta (40 mg en total) por va oral diariamente. (Take 1 tablet (40 mg total) by mouth daily.) What changed: when to take this   lactulose 10 GM/15ML solution Commonly known as: CHRONULAC Take 45 mLs (30 g total) by mouth 3 (three) times daily.   multivitamin with minerals Tabs tablet Take 1 tablet by mouth daily.   multivitamin with minerals Tabs tablet Take 1 tablet by mouth daily. Start taking on: June 23, 2021   pantoprazole 40 MG tablet Commonly known as: PROTONIX Take 1 tablet (40 mg total) by mouth daily at 6 (six) AM.   propranolol 10 MG tablet Commonly known as: INDERAL Take 1/2 tablet (5 mg total) by mouth 2 (two) times daily.   rifaximin 550 MG Tabs tablet Commonly known as: XIFAXAN Take 1 tablet (550 mg total) by mouth 2 (two) times daily.   spironolactone 100 MG tablet Commonly known as: ALDACTONE Tome 1 tableta (100 mg en total) por va oral diariamente. (Take 1 tablet (100 mg total) by mouth daily.) What changed: when to take this   thiamine 100 MG tablet Tome 1 tableta (100 mg en total) por va oral diariamente. (Take 1 tablet (100 mg total) by mouth daily.)   Vitamin D  (Ergocalciferol) 1.25 MG (50000 UNIT) Caps capsule Commonly known as: DRISDOL Take 1 capsule (50,000 Units total) by mouth every 7 (seven) days (Tuesdays) What changed: when to take this       No Known Allergies  Follow-up Information     [name], MD. Schedule an appointment as soon as possible for a visit in 1 week(s).   Specialty: Pulmonary Disease Why: August 17, 2021 at 0830. [facility] will put you  on call list, if they have cancellation they will call you  to schedule sooner. Contact information: [address] [place] [address] [phone]         [facility] Schedule an appointment as soon as possible for a visit.   Specialty: Rehabilitation Contact information: [id number] [place] [phone]        Deliah Goody,  PA-C Follow up on 07/14/2021.   Specialty: Physician Assistant Why: Follow-up at 3:30 PM Contact information: [place] [address] [phone]                  The results of significant diagnostics from this hospitalization (including imaging, microbiology, ancillary and laboratory) are listed below for reference.    Significant Diagnostic Studies: ECHOCARDIOGRAM COMPLETE  Result Date: 06/17/2021    ECHOCARDIOGRAM REPORT   Patient Name:   Mathew [name] Date of Exam: 06/17/2021 Medical Rec #:  [id number]            Height:       61.0 in Accession #:    [id number]           Weight:       188.8 lb Date of Birth:  Dec 05, 1980            BSA:          1.843 m Patient Age:    67 years             BP:           123/76 mmHg Patient Gender: M                    HR:           60 bpm. Exam Location:  Inpatient Procedure: 2D Echo, Cardiac Doppler and Color Doppler Indications:    Murmur  History:        Patient has no prior history of Echocardiogram examinations.  Sonographer:    Jyl Heinz Referring Phys: [place]  1. Left ventricular ejection fraction, by estimation, is 55 to 60%. The left ventricle has normal function. The left ventricle has no regional wall motion abnormalities. Left ventricular diastolic parameters were normal.  2. Right ventricular systolic function is normal. The right ventricular size is normal. There is normal pulmonary artery systolic pressure.  3. Left atrial size was severely dilated.  4. The mitral valve is normal in structure. Trivial mitral valve regurgitation. No evidence of mitral stenosis.  5. The aortic valve is tricuspid. Aortic valve regurgitation is not visualized. No aortic stenosis is present.  6. The inferior vena cava is normal in size with <50% respiratory variability, suggesting right atrial pressure of 8 mmHg. FINDINGS  Left Ventricle: Left ventricular ejection fraction, by estimation, is 55 to 60%. The left ventricle has normal function. The left ventricle has no regional wall motion abnormalities. The left ventricular internal cavity size was normal in size. There is  no left ventricular hypertrophy. Left ventricular diastolic parameters were normal. Right Ventricle: The right ventricular size is normal. No increase in right ventricular wall thickness. Right ventricular systolic function is normal. There is normal pulmonary artery systolic pressure. The tricuspid regurgitant velocity is 2.54 m/s, and  with an assumed right atrial pressure of 8 mmHg, the estimated right ventricular systolic pressure is 123XX123 mmHg. Left Atrium: Left atrial size was severely dilated. Right Atrium: Right atrial size was normal in size. Pericardium: There is no evidence of pericardial effusion. Mitral Valve: The mitral valve is normal in structure. Trivial mitral valve regurgitation. No evidence of mitral valve stenosis. Tricuspid Valve: The tricuspid valve is normal in structure. Tricuspid valve regurgitation is mild . No evidence of tricuspid stenosis. Aortic Valve: The aortic valve  is tricuspid. Aortic valve regurgitation is not visualized. No aortic stenosis is present. Aortic valve mean  gradient measures 8.0 mmHg. Aortic valve peak gradient measures 16.2 mmHg. Aortic valve area, by VTI measures 1.79  cm. Pulmonic Valve: The pulmonic valve was normal in structure. Pulmonic valve regurgitation is mild. No evidence of pulmonic stenosis. Aorta: The aortic root is normal in size and structure. Venous: The inferior vena cava is normal in size with less than 50% respiratory variability, suggesting right atrial pressure of 8 mmHg. IAS/Shunts: No atrial level shunt detected by color flow Doppler. Additional Comments: Mild ascites is present.  LEFT VENTRICLE PLAX 2D LVIDd:         5.60 cm      Diastology LVIDs:         3.20 cm      LV e' medial:    10.20 cm/s LV PW:         1.00 cm      LV E/e' medial:  7.8 LV IVS:        0.90 cm      LV e' lateral:   12.80 cm/s LVOT diam:     2.00 cm      LV E/e' lateral: 6.2 LV SV:         74 LV SV Index:   40 LVOT Area:     3.14 cm  LV Volumes (MOD) LV vol d, MOD A2C: 127.0 ml LV vol d, MOD A4C: 175.0 ml LV vol s, MOD A2C: 51.0 ml LV vol s, MOD A4C: 73.6 ml LV SV MOD A2C:     76.0 ml LV SV MOD A4C:     175.0 ml LV SV MOD BP:      89.1 ml RIGHT VENTRICLE             IVC RV Basal diam:  2.90 cm     IVC diam: 1.90 cm RV Mid diam:    3.30 cm RV S prime:     14.60 cm/s TAPSE (M-mode): 2.5 cm LEFT ATRIUM             Index        RIGHT ATRIUM           Index LA diam:        4.40 cm 2.39 cm/m   RA Area:     14.50 cm LA Vol (A2C):   92.9 ml 50.41 ml/m  RA Volume:   30.80 ml  16.71 ml/m LA Vol (A4C):   93.1 ml 50.51 ml/m LA Biplane Vol: 94.4 ml 51.22 ml/m  AORTIC VALVE AV Area (Vmax):    1.80 cm AV Area (Vmean):   2.03 cm AV Area (VTI):     1.79 cm AV Vmax:           201.00 cm/s AV Vmean:          134.000 cm/s AV VTI:            0.411 m AV Peak Grad:      16.2 mmHg AV Mean Grad:      8.0 mmHg LVOT Vmax:         115.00 cm/s LVOT Vmean:        86.500 cm/s LVOT VTI:           0.234 m LVOT/AV VTI ratio: 0.57  AORTA Ao Root diam: 3.30 cm Ao Asc diam:  2.90 cm MITRAL VALVE               TRICUSPID VALVE MV Area (PHT): 3.46 cm    TR Peak grad:  25.8 mmHg MV Decel Time: 219 msec    TR Vmax:        254.00 cm/s MV E velocity: 79.90 cm/s MV A velocity: 37.20 cm/s  SHUNTS MV E/A ratio:  2.15        Systemic VTI:  0.23 m                            Systemic Diam: 2.00 cm Skeet Latch MD Electronically signed by Skeet Latch MD Signature Date/Time: 06/17/2021/4:23:10 PM    Final    IR Paracentesis  Result Date: 06/17/2021 INDICATION: Ascites secondary to alcoholic cirrhosis. Request for diagnostic and therapeutic paracentesis. EXAM: ULTRASOUND GUIDED PARACENTESIS MEDICATIONS: 1% lidocaine 10 mL COMPLICATIONS: None immediate. PROCEDURE: Informed written consent was obtained from the patient after a discussion of the risks, benefits and alternatives to treatment. A timeout was performed prior to the initiation of the procedure. Initial ultrasound scanning demonstrates a large amount of ascites within the right lower abdominal quadrant. The right lower abdomen was prepped and draped in the usual sterile fashion. 1% lidocaine was used for local anesthesia. Following this, a 19 gauge, 7-cm, Yueh catheter was introduced. An ultrasound image was saved for documentation purposes. The paracentesis was performed. The catheter was removed and a dressing was applied. The patient tolerated the procedure well without immediate post procedural complication. Patient received post-procedure intravenous albumin; see nursing notes for details. FINDINGS: A total of approximately 5.4 L of clear yellow fluid was removed. Samples were sent to the laboratory as requested by the clinical team. IMPRESSION: Successful ultrasound-guided paracentesis yielding 5.4 liters of peritoneal fluid. PLAN: If the patient eventually requires >/=2 paracenteses in a 30 day period, candidacy for formal evaluation by the  [facility] will be assessed. Procedure performed by: [name], PA-C Electronically Signed   By: Lucrezia Europe M.D.   On: 06/17/2021 14:55   DG Chest Portable 1 View  Result Date: 06/17/2021 CLINICAL DATA:  Shortness of breath EXAM: PORTABLE CHEST 1 VIEW COMPARISON:  02/23/2021 FINDINGS: Cardiac shadow is stable. The overall inspiratory effort is poor. Left basilar atelectasis is noted. No bony abnormality is seen. IMPRESSION: Poor inspiratory effort with left basilar atelectasis. Electronically Signed   By: Inez Catalina M.D.   On: 06/17/2021 02:45   CT Abdomen Pelvis W Contrast  Result Date: 06/16/2021 CLINICAL DATA:  Abdominal pain. EXAM: CT ABDOMEN AND PELVIS WITH CONTRAST TECHNIQUE: Multidetector CT imaging of the abdomen and pelvis was performed using the standard protocol following bolus administration of intravenous contrast. RADIATION DOSE REDUCTION: This exam was performed according to the departmental dose-optimization program which includes automated exposure control, adjustment of the mA and/or kV according to patient size and/or use of iterative reconstruction technique. CONTRAST:  19mL OMNIPAQUE IOHEXOL 300 MG/ML  SOLN COMPARISON:  CT abdomen and pelvis 02/23/2021 FINDINGS: Lower chest: Respiratory motion artifact with mild atelectasis in the lung bases. New small left pleural effusion. Borderline cardiomegaly. Hepatobiliary: Cirrhosis. Small calcified stones in the gallbladder. No abnormal gallbladder distension. No biliary dilatation. Pancreas: Normal parenchymal enhancement.  No ductal dilatation. Spleen: Mild splenomegaly. Adrenals/Urinary Tract: Unremarkable adrenal glands. No evidence of a renal mass, calculi, or hydronephrosis. Moderate circumferential bladder wall thickening. Stomach/Bowel: The stomach is unremarkable. There is wall thickening of the duodenum and multiple loops of proximal jejunum. There is wall thickening of the cecum,  ascending colon, and possibly transverse colon although the appearance may be partially due to  under distension. There is no evidence of bowel obstruction. The appendix is unremarkable. Vascular/Lymphatic: Normal caliber of the abdominal aorta. Patent main portal vein and splenic vein. No enlarged lymph nodes. Reproductive: Unremarkable prostate. Other: Moderately large volume ascites, increased from prior. No loculated fluid collection or pneumoperitoneum. Diffuse haziness/edema throughout the mesenteric fat. Small paraumbilical hernia containing fluid. Musculoskeletal: No acute osseous abnormality or suspicious osseous lesion. IMPRESSION: 1. Cirrhosis and mild splenomegaly with increased, moderately large volume ascites. 2. Wall thickening of the duodenum, proximal jejunum, and right colon which may reflect portal enterocolopathy although enteritis is not excluded. 3. Bladder wall thickening.  Correlate for cystitis. 4. New small left pleural effusion. 5. Cholelithiasis. Electronically Signed   By: Logan Bores M.D.   On: 06/16/2021 15:51    Microbiology: Recent Results (from the past 240 hour(s))  Urine Culture     Status: Abnormal   Collection Time: 06/17/21  8:27 AM   Specimen: Urine, Clean Catch  Result Value Ref Range Status   Specimen Description URINE, CLEAN CATCH  Final   Special Requests NONE  Final   Culture (A)  Final    <10,000 COLONIES/mL INSIGNIFICANT GROWTH Performed at [facility], [address], [place], [place] [address]    Report Status 06/18/2021 FINAL  Final  Culture, body fluid w Gram Stain-bottle     Status: None   Collection Time: 06/17/21 12:06 PM   Specimen: Peritoneal Washings  Result Value Ref Range Status   Specimen Description PERITONEAL FLUID ABDOMEN  Final   Special Requests NONE  Final   Culture   Final    NO GROWTH 5 DAYS Performed at [facility] [address], [place], [place] [address]    Report Status 06/22/2021 FINAL  Final  Gram  stain     Status: None   Collection Time: 06/17/21 12:06 PM   Specimen: Peritoneal Washings  Result Value Ref Range Status   Specimen Description PERITONEAL FLUID ABDOMEN  Final   Special Requests NONE  Final   Gram Stain   Final    WBC PRESENT, PREDOMINANTLY MONONUCLEAR NO ORGANISMS SEEN CYTOSPIN SMEAR Performed at [facility], [place] [address] [place], [place] [address]    Report Status 06/17/2021 FINAL  Final     Labs: Basic Metabolic Panel: Recent Labs  Lab 06/17/21 0931 06/18/21 0723 06/19/21 0127 06/20/21 0128 06/21/21 0702 06/22/21 0045  NA  --  133* 134* 132* 134* 131*  K  --  3.7 4.0 4.3 3.5 4.1  CL  --  107 107 104 106 105  CO2  --  19* 22 23 24 22   GLUCOSE  --  81 81 99 81 101*  BUN  --  <5* <5* 7 7 10   CREATININE  --  0.51* 0.59* 0.76 0.60* 0.73  CALCIUM  --  7.7* 7.8* 7.9* 7.7* 8.0*  MG 1.7 1.6* 1.8 2.0 1.7 1.7  PHOS 3.6  --   --   --   --   --    Liver Function Tests: Recent Labs  Lab 06/18/21 0723 06/19/21 0127 06/20/21 0128 06/21/21 0702 06/22/21 0045  AST 46* 45* 43* 37 40  ALT 17 17 17 16 16   ALKPHOS 79 68 79 68 82  BILITOT 3.7* 3.5* 2.9* 2.7* 2.2*  PROT 5.4* 5.6* 5.9* 5.1* 5.6*  ALBUMIN 2.0* 2.0* 2.2* 1.9* 2.0*   Recent Labs  Lab 06/16/21 1407  LIPASE 38   Recent Labs  Lab 06/16/21 1407 06/18/21  [id number] 06/20/21 1037 06/21/21 0702  AMMONIA 60* 58* 40* 29   CBC: Recent Labs  Lab 06/16/21 1407 06/18/21 0723 06/19/21 0127 06/20/21 0128 06/21/21 0702 06/22/21 0045  WBC 1.6* 1.3* 1.8* 3.4* 1.7* 3.0*  NEUTROABS 0.8*  --  0.9*  --  0.7* 1.8  HGB 10.1* 8.9* 8.8* 9.3* 8.6* 8.8*  HCT 33.2* 26.7* 28.1* 29.3* 27.5* 28.3*  MCV 95.7 92.7 96.2 95.4 95.5 95.0  PLT 39* 31* 37* 54* 37* 51*   Cardiac Enzymes: No results for input(s): "CKTOTAL", "CKMB", "CKMBINDEX", "TROPONINI" in the last 168 hours. BNP: BNP (last 3 results) No results for input(s): "BNP" in the last 8760 hours.  ProBNP (last 3 results) No results for input(s):  "PROBNP" in the last 8760 hours.  CBG: No results for input(s): "GLUCAP" in the last 168 hours.     Signed:  Irine Seal MD.  [facility] 06/22/2021, 3:12 PM

## 2021-06-22 NOTE — Progress Notes (Addendum)
[facility] Progress Note  Mathew 41 y.o. Sep 15, 1980   Subjective: Patient seen and examined laying in bed.  Patient denies any abdominal tenderness.  Tolerating solid food well.  Slightly somnolent requires prompting to respond to interpreter.  No bowel movements today.  Patient states he does not feel like he has to go.  This Morning lactulose increased to 30 g.  Interpreter [phone]  ROS : Review of Systems  Gastrointestinal:  Negative for abdominal pain, blood in stool, constipation, diarrhea, heartburn, melena, nausea and vomiting.  Genitourinary:  Negative for dysuria and urgency.      Objective: Vital signs in last 24 hours: Vitals:   06/22/21 0354 06/22/21 0901  BP: 110/70 115/78  Pulse: 76 84  Resp: 18 16  Temp: 99.1 F (37.3 C) 98.1 F (36.7 C)  SpO2: 99% 100%    Physical Exam:  General:  Alert but somewhat somnolent, cooperative, no distress, appears stated age  Head:  Normocephalic, without obvious abnormality, atraumatic  Eyes:  Anicteric sclera, EOM's intact  Lungs:   Clear to auscultation bilaterally, respirations unlabored  Heart:  Regular rate and rhythm, S1, S2 normal  Abdomen:   Soft, non-tender, abdomen distended, bowel sounds active all four quadrants,  no masses,   Extremities: Extremities normal, atraumatic, no  edema, no asterixis  Pulses: 2+ and symmetric    Lab Results: Recent Labs    06/21/21 0702 06/22/21 0045  NA 134* 131*  K 3.5 4.1  CL 106 105  CO2 24 22  GLUCOSE 81 101*  BUN 7 10  CREATININE 0.60* 0.73  CALCIUM 7.7* 8.0*  MG 1.7 1.7   Recent Labs    06/21/21 0702 06/22/21 0045  AST 37 40  ALT 16 16  ALKPHOS 68 82  BILITOT 2.7* 2.2*  PROT 5.1* 5.6*  ALBUMIN 1.9* 2.0*   Recent Labs    06/21/21 0702 06/22/21 0045  WBC 1.7* 3.0*  NEUTROABS 0.7* 1.8  HGB 8.6* 8.8*  HCT 27.5* 28.3*  MCV 95.5 95.0  PLT 37* 51*   No results for input(s): "LABPROT", "INR" in the last 72  hours.    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  Patient previously getting 20 g lactulose 3 times daily. Today changed to Lactulose 30 g.    Ascites   -CT abdomen/pelvis 06/16/2021 shows cirrhosis and mild splenomegaly with increased, moderately large volume ascites.  Wall thickening of the duodenum, proximal jejunum, and right colon which may reflect portal and or colopathy although enteritis is not excluded.  Bladder wall thickening, correlate for cystitis.  New small left pleural effusion.  Cholelithiasis.   S/p paracentesis with IR, 5 L of fluid removed, fluid analysis and cultures negative for SBP.  Unable to calculate SAAG.   Patient currently taking furosemide 40 mg daily and spironolactone 100 mg daily.  Unable  to increase dosage due to hypotension.  Esophageal varices   Patient had EGD/colon 03/17/2018 with Dr. Cristina Gong inpatient, with findings of mild portal gastropathy and grade 2 varices on upper endoscopy, friable mucosa on colonoscopy with 10-year recall recommended.  Patient taking propranolol 10 mg twice daily.  Unable to increase dosage due to hypotension.   Plan: Continue folic acid, multivitamin, thiamine Continue 30 g lactulose 3 times daily and Xifaxan 550 mg 2 times daily for hepatic encephalopathy. Patient can fill out online application for assistance with Xifaxan or come to GI  office for help with application. May also pick up samples from GI office if needed.  Continue furosemide 40 mg daily and spironolactone 100 mg daily for ascites Continue to monitor hemoglobin we will transfuse for hemoglobin less than 7. Recommend outpatient follow-up for decompensated liver cirrhosis.  Continue Xifaxan, lactulose and current diuretic regimen as outpatient.  [facility] will sign off. Scheduled patient for f/u July 5th at 3:30pm with Deliah Goody PA-C.  Mathew Nigh [name] PA-C 06/22/2021, 11:10 AM  Contact #  [phone]

## 2021-06-22 NOTE — Plan of Care (Signed)

## 2021-06-22 NOTE — Progress Notes (Addendum)
Mobility Specialist Progress Note:   06/21/21 1400  Mobility  Activity Transferred from bed to chair  Level of Assistance Standby assist, set-up cues, supervision of patient - no hands on  Assistive Device None  Distance Ambulated (ft) 5 ft  Activity Response Tolerated well  $Mobility charge 1 Mobility   Responded to bed alarm, pt found to be unsuccessfully attempting to void in urinal. Transferred to chair to do linen change, pt up eating in recliner. NT in room.  [facility] Secure Chat or Office Phone: [phone]

## 2021-06-22 NOTE — TOC Progression Note (Signed)
Transition of Care [facility]) - Progression Note    Patient Details  Name: Michaeljoseph Revolorio MRN: [id number] Date of Birth: [birth year]  [facility]) CM/SW Contact  Nadene Rubins Adria Devon, RN Phone Number: 06/22/2021, 4:10 PM  Clinical Narrative:     Spoke to patient and family at bedside with interpreter .   Burman [employer] is $2935 .   Per GI there is an online application for it to be free. He will need to be a [place] citizen, have total amount of household income and how many people in household. If he is unable to manage application at home he can fill it out in GI  office as well. GI has  12-15 days worth of samples if needed he can pick up at GI  office when he is discharged.   Discussed with patient. He is not a [place] Citizen. He can go to GI office today and pick up samples. Secure chatted GI and attending   Expected Discharge Plan: Home/Self Care    Expected Discharge Plan and Services Expected Discharge Plan: Home/Self Care In-house Referral: Financial Counselor Discharge Planning Services: CM Consult, [facility], [employer] Program, Medication Assistance   Living arrangements for the past 2 months: Single Family Home Expected Discharge Date: 06/22/21                 DME Agency: NA       HH Arranged: NA           Social Determinants of Health (SDOH) Interventions    Readmission Risk Interventions     No data to display

## 2021-06-22 NOTE — TOC Progression Note (Signed)
Transition of Care [facility]) - Progression Note    Patient Details  Name: Mathew [name] MRN: [id number] Date of Birth: 05-Jun-1980  Transition of Care [facility]) CM/SW Contact  Nadene Rubins, Adria Devon, RN Phone Number: 06/22/2021, 9:42 AM  Clinical Narrative:     OP OT ordered and information placed on AVS  Expected Discharge Plan: Home/Self Care    Expected Discharge Plan and Services Expected Discharge Plan: Home/Self Care In-house Referral: Financial Counselor Discharge Planning Services: CM Consult, [facility], [facility] Program, Medication Assistance   Living arrangements for the past 2 months: Single Family Home                   DME Agency: NA       HH Arranged: NA           Social Determinants of Health (SDOH) Interventions    Readmission Risk Interventions     No data to display

## 2021-06-23 ENCOUNTER — Telehealth: Payer: Self-pay

## 2021-06-23 LAB — PATHOLOGIST SMEAR REVIEW

## 2021-06-23 NOTE — Telephone Encounter (Signed)
Transition Care Management Follow-up Telephone Call  Call completed with assistance of Spanish Interpreter [phone]/[employer]. The interpreter stated that he was not responding to the questions he was asked and when he responded he was very slow to answer.  Date of discharge and from where: 06/22/2021, [facility] How have you been since you were released from the hospital? He said he was feeling much better Any questions or concerns? Yes - he wanted to know when he can return to work.   Items Reviewed: Did the pt receive and understand the discharge instructions provided?  He said he had papers but was not sure what they were.  When asked if he could read  Spanish he said yes.  I tried to ask him to review the papers but he did not respond to that statement  Medications obtained and verified?  He said he has medications, not sure if he has the new ones and he can't remember if he picked anything up at GI office.  He said that his aunt helped him with the medications.  Instructed him to bring all of his medications with him to his appointment with Dr [name] next week.  Other? No  Any new allergies since your discharge? No  Dietary orders reviewed? No Do you have support at home?  He said he lives with one person but that person is not family.  He has an aunt who lives in the area who gets his medications.   Home Care and Equipment/Supplies: Were home health services ordered? no If so, what is the name of the agency? N/a  Has the agency set up a time to come to the patient's home? not applicable Were any new equipment or medical supplies ordered?  No What is the name of the medical supply agency? N/a Were you able to get the supplies/equipment? not applicable Do you have any questions related to the use of the equipment or supplies? No  Functional Questionnaire: (I = Independent and D = Dependent) ADLs: independent  Follow up appointments reviewed:  PCP Hospital f/u appt  confirmed? Yes  Scheduled to see Dr [name] - 6/20/203.provided him with the clinic address and he said he knew where is was.  He was just at the clinic last week.  Specialist Hospital f/u appt confirmed?  None scheduled at this time   Are transportation arrangements needed? No - -he said his brother will drive him  If their condition worsens, is the pt aware to call PCP or go to the Emergency Dept.? Yes Was the patient provided with contact information for the PCP's office or ED? Yes Was to pt encouraged to call back with questions or concerns? Yes

## 2021-06-29 ENCOUNTER — Ambulatory Visit (HOSPITAL_BASED_OUTPATIENT_CLINIC_OR_DEPARTMENT_OTHER): Payer: Self-pay | Admitting: Critical Care Medicine

## 2021-06-29 ENCOUNTER — Telehealth: Payer: Self-pay

## 2021-06-29 DIAGNOSIS — Z91199 Patient's noncompliance with other medical treatment and regimen due to unspecified reason: Secondary | ICD-10-CM

## 2021-06-29 NOTE — Progress Notes (Signed)
Patietn was no show.  Pt brother could not pick him up for a ride  will have to reschedule  Call brother Victor's phone for new appt and time  add on next week

## 2021-06-29 NOTE — Progress Notes (Deleted)
Established Patient Office Visit  Subjective   Patient ID: Mathew [name], male    DOB: [birth year]  Age: 41 y.o. MRN: [id number]  No chief complaint on file.   06/2021 Interpreter: De Nurse [phone]  Mathew [name] is a 41 y.o. Male with a history of Cirrhosis, thrombocytopenia, and alcohol use disorder presents to the clinic today for worsening umbilical hernia pain. This is a known condition and most likely worsening due to his ascites and liver disease. He now complains of shortness of breath at night when laying down in bed with associated groin and leg edema Patient has been referred to general surgery but due to his current condition is not a candidate for surgical repair.   Patients admits he is not adherent to his medications and continues to drink alcohol.   Last known alcoholic intake was this past Sunday where he admitted to drinking 5-6 beers.    06/29/21 TOC visit post hosp  PCP: [name], MD   Admit date: 06/16/2021 Discharge date: 06/22/2021   Time spent: 60 minutes   Recommendations for Outpatient Follow-up:  1. Follow-up with [name], MD on August 17, 2021 at 8:30 AM.  On follow-up patient will need a comprehensive metabolic profile done to follow-up on electrolytes, renal function, LFTs.  Patient need a CBC done to follow-up on counts.  Umbilical hernia will need to be followed up upon. 2. Follow-up with Celso Amy, PA, [facility] gastroenterology on July 14 2021 at 3:30 PM.     Discharge Diagnoses:  Principal Problem:   Decompensated hepatic cirrhosis (HCC) Active Problems:   Acute metabolic encephalopathy   ETOH abuse   Microcytic anemia   Thrombocytopenia (HCC)   Alcoholic cirrhosis of liver with ascites (HCC)   Edema due to hypoalbuminemia   Umbilical hernia without obstruction and without gangrene   SBP (spontaneous bacterial peritonitis) (HCC)   Pancytopenia (HCC)   Tobacco abuse     Discharge Condition: Stable and improved.   Diet  recommendation: Heart healthy   Filed Weights   06/19/21 0428 06/20/21 0256 06/21/21 0320 Weight: 78.9 kg 79.9 kg 77.2 kg     History of present illness:  HPI per Dr. [name] [facility] Mathew [name] is a 41 y.o. male with medical history significant of alcohol-related cirrhosis, recurrent ascites, nonadherence to medical therapy and umbilical hernia.  In regards to the hernia patient has been evaluated by surgery but unless incarcerated and needs urgent surgery he is not an elective surgical candidate.  Patient follows with the [facility] by [name].  He has been seen recently in the office (06/15/2021) and plans were made to proceed with outpatient paracentesis.  Patient has developed increasing abdominal pain and was sent to the ER for evaluation   In the ER he was noted to have significant abdominal discomfort, massive ascites, total bilirubin up to 3.6 which is higher than baseline of 2.3.  Hospitalist service was asked to admit this patient for possible SBP and therapeutic/diagnostic paracentesis.   The patient reports he has had diffuse abdominal pain for several weeks which has worsened over the past several days.  He states that he drinks about 2 beers per day but not daily. A few days ago he attempted to drink a beer but it made him vomit.  He denies any GI symptoms such as blood or dark emesis.  He states his bowel movements are normal.  No change in urinary output or ability  to void.  No shortness of breath or chest pain.  Of note patient did undergo an EGD in 2020 which showed mild portal gastropathy and grade 2 varices.  Patient was currently reporting that he is hungry.   Hospital Course:  #1 acute abdominal pain secondary to massive ascites and concern for SBP -Patient presented with abdominal pain felt secondary to massive ascites contributing to his pain and also concern for SBP. -Patient underwent ultrasound-guided paracentesis by IR with 5.4 L of  clear yellow fluid removed with no immediate complications. -Fluid sent for cultures and Gram stain with no organisms noted.  -Patient initially placed on IV Rocephin, PPI, lactulose, Lasix, spironolactone . -Patient improved clinically, IV Rocephin subsequently discontinued.   -Patient seen by GI during the hospitalization and will follow-up with GI in the outpatient setting.    2.  Mildly decompensated alcoholic cirrhosis -Patient presented with massive ascites, abdominal distention and pain, total bilirubin noted to have increased to 3.6 from baseline of 2.3 on admission. -INR at 2.3 (06/19/2021) -Patient also noted with a pancytopenia. -??  Compliance with medications. -Compliance stressed to patient. -Patient confused during the hospitalization, concern for possible hepatic encephalopathy. -Patient maintained on Lasix 40 mg daily, spironolactone 100 mg daily, lactulose, now normal started, Xifaxan.   -Alcohol cessation stressed to patient.   -Patient initially placed on IV Rocephin pending paracentesis results which were negative for any organisms and as such IV Rocephin subsequently discontinued.   -Patient seen in consultation by GI will follow the patient throughout the hospitalization.   -Outpatient follow-up with GI.   3.  Acute metabolic encephalopathy/hepatic encephalopathy -Patient noted to be confused the morning of 06/20/2021.  -Patient trying to bite at telemetry leads.  -Patient noted per RN to be pacing the hallways all night and having hallucinations of seeing his children in the hallways. -Confusion likely secondary to hepatic encephalopathy. -Ammonia levels noted to be elevated. -Patient placed on lactulose and dose adjusted to 30 g 3 times daily, Xifaxan added per gastroenterology. -Patient improved clinically and will be discharged in stable and improved condition on lactulose and Xifaxan. -Outpatient follow-up with GI/PCP.  4.  Grade 2 varices -Patient with no  hematemesis or hematochezia. -Patient with alcoholic cirrhosis. -Patient maintained on PPI and started on nadolol and dose decreased due to soft borderline blood pressure.   -Outpatient follow-up with PCP/GI.    5.  Painful umbilical hernia -Patient with some complaints of umbilical hernia noted to be distended but soft and easily reducible. -CT abdomen and pelvis done unremarkable with no incarceration or obstructive process noted. -Patient with no significant abdominal pain post paracentesis. -Outpatient follow-up with PCP.  6.  Systolic murmur -Noted per admitting physician. -2D echo done with EF of 55 to 60%, no wall motion abnormalities, normal right ventricular systolic function.  Severely dilated left atrial size. -Outpatient follow-up with PCP.  7.  Pancytopenia -Likely secondary to alcoholic cirrhosis. -Patient with no overt bleeding. -Anemia panel with iron level of 18, TIBC of 185, ferritin of 14, folate of 10.9.  Vitamin B12 levels of 806. -Counts fluctuating however were improving by day of discharge. -Outpatient follow-up with PCP.  8.  Tobacco abuse -Tobacco cessation stressed to patient.  9.  History of alcohol abuse -Alcohol cessation stressed to patient. -No significant withdrawal noted. -Patient however with confusion on 06/20/2021 with some hallucinations felt likely secondary to hepatic encephalopathy.  -Patient maintained on Ativan withdrawal protocol, thiamine, folic acid, multivitamin.   -Alcohol cessation stressed to patient.   -  TOC consulted to provide information on alcohol rehab to patient and family.   10.  Hypomagnesemia -Magnesium level of 1.7 -Likely secondary to ongoing alcohol abuse. -Magnesium repleted.        Procedures:  Ultrasound-guided paracentesis per IR 06/17/2021 with 5.4 L of clear yellow fluid removed, [name], PA  Chest x-ray 06/17/2021  CT abdomen and pelvis 06/16/2021  2D echo 06/17/2021   Consultations:  GI: Dr. Watt Climes  06/18/2021        Review of Systems  Constitutional:  Positive for malaise/fatigue.  HENT: Negative.    Eyes: Negative.   Respiratory:  Positive for shortness of breath.   Cardiovascular:  Positive for leg swelling.  Gastrointestinal:  Positive for abdominal pain.  Genitourinary: Negative.   Musculoskeletal: Negative.   Skin: Negative.   Neurological: Negative.   Endo/Heme/Allergies: Negative.   Psychiatric/Behavioral:  Positive for substance abuse. The patient is nervous/anxious.       Objective:     There were no vitals taken for this visit. BP Readings from Last 3 Encounters:  06/22/21 115/78  06/15/21 137/83  03/08/21 (!) 143/81   Wt Readings from Last 3 Encounters:  06/21/21 170 lb 3.1 oz (77.2 kg)  06/15/21 188 lb 12.8 oz (85.6 kg)  03/08/21 165 lb 11.2 oz (75.2 kg)      Physical Exam Constitutional:      Appearance: He is ill-appearing. He is not toxic-appearing.  Eyes:     General: Scleral icterus present.  Cardiovascular:     Rate and Rhythm: Normal rate and regular rhythm.     Pulses: Normal pulses.     Heart sounds: Normal heart sounds.  Pulmonary:     Effort: Pulmonary effort is normal.     Breath sounds: Examination of the right-middle field reveals decreased breath sounds. Examination of the left-middle field reveals decreased breath sounds. Examination of the right-lower field reveals decreased breath sounds. Examination of the left-lower field reveals decreased breath sounds. Decreased breath sounds present. No wheezing, rhonchi or rales.       Comments: Dullness to percussion 3/4 up bilaterally likely due to pleural effusion  Abdominal:     General: Bowel sounds are decreased. There is distension.     Palpations: There is fluid wave.     Tenderness: There is generalized abdominal tenderness. There is guarding. There is no rebound.     Hernia: A hernia is present. Hernia is present in the umbilical area.       Comments: Bruising  LUQ Umbilical hernia reduceable  Genitourinary:    Testes:        Right: Testicular hydrocele present.        Left: Testicular hydrocele present.  Musculoskeletal:        General: Swelling present.     Right lower leg: Edema present.     Left lower leg: Edema present.  Skin:    Coloration: Skin is jaundiced.     No results found for any visits on 06/29/21.  Last CBC Lab Results  Component Value Date   WBC 3.0 (L) 06/22/2021   HGB 8.8 (L) 06/22/2021   HCT 28.3 (L) 06/22/2021   MCV 95.0 06/22/2021   MCH 29.5 06/22/2021   RDW 17.6 (H) 06/22/2021   PLT 51 (L) 99991111   Last metabolic panel Lab Results  Component Value Date   GLUCOSE 101 (H) 06/22/2021   NA 131 (L) 06/22/2021   K 4.1 06/22/2021   CL 105 06/22/2021   CO2 22 06/22/2021  BUN 10 06/22/2021   CREATININE 0.73 06/22/2021   GFRNONAA >60 06/22/2021   CALCIUM 8.0 (L) 06/22/2021   PHOS 3.6 06/17/2021   PROT 5.6 (L) 06/22/2021   ALBUMIN 2.0 (L) 06/22/2021   LABGLOB 3.7 06/15/2021   AGRATIO 0.8 (L) 06/15/2021   BILITOT 2.2 (H) 06/22/2021   ALKPHOS 82 06/22/2021   AST 40 06/22/2021   ALT 16 06/22/2021   ANIONGAP 4 (L) 06/22/2021   Last lipids No results found for: "CHOL", "HDL", "LDLCALC", "LDLDIRECT", "TRIG", "CHOLHDL" Last hemoglobin A1c Lab Results  Component Value Date   HGBA1C 4.7 (L) 10/14/2019   Last thyroid functions No results found for: "TSH", "T3TOTAL", "T4TOTAL", "THYROIDAB" Last vitamin D Lab Results  Component Value Date   VD25OH 5.5 (L) 12/19/2019   Last vitamin B12 and Folate Lab Results  Component Value Date   VITAMINB12 806 06/17/2021   FOLATE 10.9 06/17/2021    The ASCVD Risk score (Arnett DK, et al., 2019) failed to calculate for the following reasons:   Cannot find a previous HDL lab   Cannot find a previous total cholesterol lab    Assessment & Plan:   Problem List Items Addressed This Visit   None 38 minutes spent assessing patient language barrier added  time  No follow-ups on file.   Asencion Noble, MD

## 2021-06-29 NOTE — Telephone Encounter (Signed)
Called pt brother and left vm about getting appt for [employer] [phone]

## 2021-06-30 ENCOUNTER — Encounter (HOSPITAL_COMMUNITY): Payer: Self-pay

## 2021-06-30 ENCOUNTER — Emergency Department (HOSPITAL_COMMUNITY): Payer: Self-pay

## 2021-06-30 ENCOUNTER — Emergency Department (HOSPITAL_COMMUNITY)
Admission: EM | Admit: 2021-06-30 | Discharge: 2021-06-30 | Disposition: A | Discharge status: Home / Self Care | Payer: Self-pay | Attending: Emergency Medicine | Admitting: Emergency Medicine

## 2021-06-30 ENCOUNTER — Other Ambulatory Visit: Payer: Self-pay

## 2021-06-30 DIAGNOSIS — D696 Thrombocytopenia, unspecified: Secondary | ICD-10-CM | POA: Insufficient documentation

## 2021-06-30 DIAGNOSIS — K429 Umbilical hernia without obstruction or gangrene: Secondary | ICD-10-CM | POA: Insufficient documentation

## 2021-06-30 DIAGNOSIS — R930 Abnormal findings on diagnostic imaging of skull and head, not elsewhere classified: Secondary | ICD-10-CM | POA: Insufficient documentation

## 2021-06-30 DIAGNOSIS — F1090 Alcohol use, unspecified, uncomplicated: Secondary | ICD-10-CM | POA: Insufficient documentation

## 2021-06-30 LAB — URINALYSIS, ROUTINE W REFLEX MICROSCOPIC
Bilirubin Urine: NEGATIVE
Glucose, UA: NEGATIVE mg/dL
Ketones, ur: NEGATIVE mg/dL
Leukocytes,Ua: NEGATIVE
Nitrite: NEGATIVE
Protein, ur: NEGATIVE mg/dL
Specific Gravity, Urine: 1.038 — ABNORMAL HIGH (ref 1.005–1.030)
pH: 7 (ref 5.0–8.0)

## 2021-06-30 LAB — CBC WITH DIFFERENTIAL/PLATELET
Abs Immature Granulocytes: 0 10*3/uL (ref 0.00–0.07)
Basophils Absolute: 0 10*3/uL (ref 0.0–0.1)
Basophils Relative: 1 %
Eosinophils Absolute: 0 10*3/uL (ref 0.0–0.5)
Eosinophils Relative: 1 %
HCT: 32 % — ABNORMAL LOW (ref 39.0–52.0)
Hemoglobin: 10.4 g/dL — ABNORMAL LOW (ref 13.0–17.0)
Immature Granulocytes: 0 %
Lymphocytes Relative: 53 %
Lymphs Abs: 0.8 10*3/uL (ref 0.7–4.0)
MCH: 29.6 pg (ref 26.0–34.0)
MCHC: 32.5 g/dL (ref 30.0–36.0)
MCV: 91.2 fL (ref 80.0–100.0)
Monocytes Absolute: 0.2 10*3/uL (ref 0.1–1.0)
Monocytes Relative: 11 %
Neutro Abs: 0.5 10*3/uL — ABNORMAL LOW (ref 1.7–7.7)
Neutrophils Relative %: 34 %
Platelets: 41 10*3/uL — ABNORMAL LOW (ref 150–400)
RBC: 3.51 MIL/uL — ABNORMAL LOW (ref 4.22–5.81)
RDW: 17.2 % — ABNORMAL HIGH (ref 11.5–15.5)
WBC: 1.5 10*3/uL — ABNORMAL LOW (ref 4.0–10.5)
nRBC: 0 % (ref 0.0–0.2)

## 2021-06-30 LAB — COMPREHENSIVE METABOLIC PANEL
ALT: 22 U/L (ref 0–44)
AST: 69 U/L — ABNORMAL HIGH (ref 15–41)
Albumin: 2.4 g/dL — ABNORMAL LOW (ref 3.5–5.0)
Alkaline Phosphatase: 119 U/L (ref 38–126)
Anion gap: 7 (ref 5–15)
BUN: <5 mg/dL — ABNORMAL LOW (ref 6–20)
CO2: 24 mmol/L (ref 22–32)
Calcium: 7.9 mg/dL — ABNORMAL LOW (ref 8.9–10.3)
Chloride: 109 mmol/L (ref 98–111)
Creatinine, Ser: 0.56 mg/dL — ABNORMAL LOW (ref 0.61–1.24)
GFR, Estimated: >60 mL/min (ref >60)
Glucose, Bld: 108 mg/dL — ABNORMAL HIGH (ref 70–99)
Potassium: 3.9 mmol/L (ref 3.5–5.1)
Sodium: 140 mmol/L (ref 135–145)
Total Bilirubin: 1.9 mg/dL — ABNORMAL HIGH (ref 0.3–1.2)
Total Protein: 6.7 g/dL (ref 6.5–8.1)

## 2021-06-30 LAB — PROTIME-INR
INR: 1.8 — ABNORMAL HIGH (ref 0.8–1.2)
Prothrombin Time: 20.3 seconds — ABNORMAL HIGH (ref 11.4–15.2)

## 2021-06-30 LAB — LIPASE, BLOOD: Lipase: 43 U/L (ref 11–51)

## 2021-06-30 LAB — ETHANOL: Alcohol, Ethyl (B): 314 mg/dL (ref <10)

## 2021-06-30 MED ORDER — IOHEXOL 300 MG/ML  SOLN
100.0000 mL | Freq: Once | INTRAMUSCULAR | Status: AC | PRN
  Administered 2021-06-30: 100 mL via INTRAVENOUS
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
## 2021-06-30 NOTE — ED Notes (Signed)
Ethanol 314 reported to MD Rubin Payor

## 2021-07-01 ENCOUNTER — Telehealth: Payer: Self-pay

## 2021-07-01 LAB — URINE CULTURE: Culture: <10000 — AB

## 2021-07-01 NOTE — Telephone Encounter (Signed)
I called patient's brother, Alecia Lemming, and scheduled an appointment for patient with Dr [name] - 07/08/2021 @ 1100.  Alecia Lemming said he will be bringing him to the appointment. I confirmed clinic address with him.  Spanish interpreter [id number]/[employer] assisted.

## 2021-07-02 LAB — PATHOLOGIST SMEAR REVIEW

## 2021-07-05 ENCOUNTER — Ambulatory Visit: Payer: Self-pay | Admitting: Critical Care Medicine

## 2021-07-08 ENCOUNTER — Other Ambulatory Visit: Payer: Self-pay

## 2021-07-08 ENCOUNTER — Ambulatory Visit: Payer: Self-pay | Attending: Critical Care Medicine | Admitting: Critical Care Medicine

## 2021-07-08 ENCOUNTER — Encounter: Payer: Self-pay | Admitting: Critical Care Medicine

## 2021-07-08 VITALS — BP 102/64 | HR 76 | Temp 98.8°F | Ht 65.0 in | Wt 159.0 lb

## 2021-07-08 DIAGNOSIS — Z720 Tobacco use: Secondary | ICD-10-CM

## 2021-07-08 DIAGNOSIS — K7460 Unspecified cirrhosis of liver: Secondary | ICD-10-CM

## 2021-07-08 DIAGNOSIS — K7031 Alcoholic cirrhosis of liver with ascites: Secondary | ICD-10-CM

## 2021-07-08 DIAGNOSIS — F1010 Alcohol abuse, uncomplicated: Secondary | ICD-10-CM

## 2021-07-08 DIAGNOSIS — E8809 Other disorders of plasma-protein metabolism, not elsewhere classified: Secondary | ICD-10-CM

## 2021-07-08 DIAGNOSIS — K7030 Alcoholic cirrhosis of liver without ascites: Secondary | ICD-10-CM

## 2021-07-08 DIAGNOSIS — D696 Thrombocytopenia, unspecified: Secondary | ICD-10-CM

## 2021-07-08 DIAGNOSIS — D61818 Other pancytopenia: Secondary | ICD-10-CM

## 2021-07-08 DIAGNOSIS — K429 Umbilical hernia without obstruction or gangrene: Secondary | ICD-10-CM

## 2021-07-08 DIAGNOSIS — G9341 Metabolic encephalopathy: Secondary | ICD-10-CM

## 2021-07-08 DIAGNOSIS — K652 Spontaneous bacterial peritonitis: Secondary | ICD-10-CM

## 2021-07-08 DIAGNOSIS — E559 Vitamin D deficiency, unspecified: Secondary | ICD-10-CM

## 2021-07-08 DIAGNOSIS — K7290 Hepatic failure, unspecified without coma: Secondary | ICD-10-CM

## 2021-07-08 MED ORDER — LACTULOSE 10 GM/15ML PO SOLN
30.0000 g | Freq: Three times a day (TID) | ORAL | 2 refills | Status: DC
  Filled 2021-07-08: qty 1892, 14d supply, fill #0

## 2021-07-08 MED ORDER — ADULT MULTIVITAMIN W/MINERALS CH
1.0000 | ORAL_TABLET | Freq: Every day | ORAL | Status: DC
Start: 2021-07-08 — End: 2021-08-25

## 2021-07-08 MED ORDER — PROPRANOLOL HCL 10 MG PO TABS
5.0000 mg | ORAL_TABLET | Freq: Two times a day (BID) | ORAL | 1 refills | Status: DC
  Filled 2021-07-08: qty 60, 60d supply, fill #0

## 2021-07-08 MED ORDER — FERROUS SULFATE 325 (65 FE) MG PO TABS
325.0000 mg | ORAL_TABLET | Freq: Three times a day (TID) | ORAL | 3 refills | Status: DC
  Filled 2021-07-08: qty 30, 10d supply, fill #0

## 2021-07-08 MED ORDER — PANTOPRAZOLE SODIUM 40 MG PO TBEC
40.0000 mg | DELAYED_RELEASE_TABLET | Freq: Every day | ORAL | 1 refills | Status: DC
  Filled 2021-07-08: qty 30, 30d supply, fill #0

## 2021-07-08 MED ORDER — VITAMIN D (ERGOCALCIFEROL) 1.25 MG (50000 UNIT) PO CAPS
50000.0000 [IU] | ORAL_CAPSULE | ORAL | 2 refills | Status: DC
  Filled 2021-07-08 – 2021-11-11 (×2): qty 12, 84d supply, fill #0

## 2021-07-08 MED ORDER — SPIRONOLACTONE 100 MG PO TABS
100.0000 mg | ORAL_TABLET | Freq: Every day | ORAL | 1 refills | Status: DC
  Filled 2021-07-08: qty 30, 30d supply, fill #0

## 2021-07-08 MED ORDER — FUROSEMIDE 40 MG PO TABS
40.0000 mg | ORAL_TABLET | Freq: Every day | ORAL | 1 refills | Status: DC
  Filled 2021-07-08: qty 30, 30d supply, fill #0

## 2021-07-08 MED ORDER — RIFAXIMIN 550 MG PO TABS
550.0000 mg | ORAL_TABLET | Freq: Two times a day (BID) | ORAL | 1 refills | Status: DC
  Filled 2021-07-08: qty 60, 30d supply, fill #0

## 2021-07-08 MED ORDER — LORAZEPAM 0.5 MG PO TABS: ORAL_TABLET | ORAL | 0 refills | Status: DC

## 2021-07-08 MED ORDER — THIAMINE HCL 100 MG PO TABS
100.0000 mg | ORAL_TABLET | Freq: Every day | ORAL | 2 refills | Status: DC
  Filled 2021-07-08: qty 60, 60d supply, fill #0

## 2021-07-08 MED ORDER — FOLIC ACID 1 MG PO TABS
1.0000 mg | ORAL_TABLET | Freq: Every day | ORAL | 1 refills | Status: DC
  Filled 2021-07-08: qty 30, 30d supply, fill #0

## 2021-07-08 MED ORDER — ADULT MULTIVITAMIN W/MINERALS CH: 1.0000 | ORAL_TABLET | Freq: Every day | ORAL | Status: DC
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
## 2021-07-08 NOTE — Assessment & Plan Note (Signed)
This persists secondary to alcoholism

## 2021-07-08 NOTE — Assessment & Plan Note (Signed)
Bone marrow suppression from large volume alcohol needs to be monitored  Patient never picked up his folic acid or thiamine refill sent

## 2021-07-08 NOTE — Assessment & Plan Note (Addendum)
Ongoing alcohol use is a major problem here resulting in decompensation of hepatic cirrhosis with portal hypertension tense ascites culture negative no evidence of spontaneous bacterial peritonitis from June admission.  Patient however had alcoholic encephalopathy during admission and withdrawal symptoms.  He is currently drinking about 2-3 beers a day.  We advocated that he not drink any further and would run to get him in with our clinical social work for counseling also gave him the Alcoholics Anonymous phone number to call to get in with the group.  Plan will also be to give him a short course of lorazepam to control his withdrawal symptoms.  Patient must stay on all of his medications for his decompensated liver disease including the lactulose and the other medications given at the hospital I will provide refills on all these medications  Patient also has a follow-up appoint with gastroenterology July 5 next week I encouraged him he must keep that appointment
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
## 2021-07-08 NOTE — Assessment & Plan Note (Signed)
I want him to focus on alcohol cessation and we will hold off on tobacco cessation

## 2021-07-08 NOTE — Assessment & Plan Note (Signed)
Patient given alcoholic Anonymous number we will also have licensed clinical social worker connect with him and I gave him a Ativan taper to keep him off alcohol and avoid withdrawal

## 2021-07-08 NOTE — Assessment & Plan Note (Signed)
Cultures all negative he did receive a course of antibiotics in the hospital no further antibiotics indicated

## 2021-07-08 NOTE — Assessment & Plan Note (Addendum)
Patient needs to continue with his furosemide and Aldactone program

## 2021-07-08 NOTE — Assessment & Plan Note (Signed)
As per decompensation of liver cirrhosis problem

## 2021-07-08 NOTE — Assessment & Plan Note (Signed)
Patient has vitamin D ordered

## 2021-07-08 NOTE — Patient Instructions (Addendum)
Take lorazepam as directed on the prescription and a slow taper dose this was sent to your [facility] pharmacy on [address] in [place]  All other medications you are currently taking were given refills and sent to our pharmacy downstairs including vitamins you have never picked up  Keep your appointment July 5 at 3:30 PM with [facility] gastroenterology on [address]  Return to see [name] 1 month  Our clinical social worker Toni Amend will call you for counseling  Please join Alcoholics Anonymous call the number below to join the have Spanish-speaking groups [employer] (website) or [phone] is the information for alcoholics anonymous  You must stop drinking completely this is very critical  Tome lorazepam como se indica en la receta y Mathew [name] Dear dosis decreciente lenta que se envi a su Financial controller en [place] en [place] dems medicamentos que est tomando actualmente se resurtieron y se enviaron a Insurance claims handler en [place], incluidas las vitaminas que nunca recogi.  Mantenga su cita el 5 de julio a las 3:30 p. m. [facility] en [address]  Volver a ver al [name] 1 mes  Nuestra trabajadora social [place], lo llamar para brindarle asesoramiento.  nase a Alcohlicos Annimos, llame al nmero a continuacin para unirse a los grupos de habla hispana. [employer] (sitio web) o [phone] es la informacin para alcohlicos annimos  Debe dejar de beber por completo esto es muy crtico

## 2021-07-08 NOTE — Assessment & Plan Note (Signed)
Patient stays fixated on the umbilical hernia this is not repairable at this time nor does it need repair

## 2021-07-19 ENCOUNTER — Encounter (HOSPITAL_COMMUNITY): Payer: Self-pay

## 2021-07-19 ENCOUNTER — Other Ambulatory Visit: Payer: Self-pay

## 2021-07-19 ENCOUNTER — Emergency Department (HOSPITAL_COMMUNITY): Payer: Self-pay

## 2021-07-19 ENCOUNTER — Emergency Department (HOSPITAL_COMMUNITY)
Admission: EM | Admit: 2021-07-19 | Discharge: 2021-07-19 | Disposition: A | Discharge status: Home / Self Care | Payer: Self-pay | Attending: Emergency Medicine | Admitting: Emergency Medicine

## 2021-07-19 DIAGNOSIS — Y908 Blood alcohol level of 240 mg/100 ml or more: Secondary | ICD-10-CM | POA: Insufficient documentation

## 2021-07-19 DIAGNOSIS — E872 Acidosis, unspecified: Secondary | ICD-10-CM | POA: Insufficient documentation

## 2021-07-19 DIAGNOSIS — F10129 Alcohol abuse with intoxication, unspecified: Secondary | ICD-10-CM | POA: Insufficient documentation

## 2021-07-19 DIAGNOSIS — R519 Headache, unspecified: Secondary | ICD-10-CM | POA: Insufficient documentation

## 2021-07-19 DIAGNOSIS — D696 Thrombocytopenia, unspecified: Secondary | ICD-10-CM | POA: Insufficient documentation

## 2021-07-19 DIAGNOSIS — M79602 Pain in left arm: Secondary | ICD-10-CM | POA: Insufficient documentation

## 2021-07-19 DIAGNOSIS — R52 Pain, unspecified: Secondary | ICD-10-CM

## 2021-07-19 DIAGNOSIS — F10920 Alcohol use, unspecified with intoxication, uncomplicated: Secondary | ICD-10-CM

## 2021-07-19 DIAGNOSIS — J90 Pleural effusion, not elsewhere classified: Secondary | ICD-10-CM | POA: Insufficient documentation

## 2021-07-19 DIAGNOSIS — D61818 Other pancytopenia: Secondary | ICD-10-CM | POA: Insufficient documentation

## 2021-07-19 DIAGNOSIS — K7460 Unspecified cirrhosis of liver: Secondary | ICD-10-CM | POA: Insufficient documentation

## 2021-07-19 DIAGNOSIS — M79601 Pain in right arm: Secondary | ICD-10-CM | POA: Insufficient documentation

## 2021-07-19 LAB — CBC WITH DIFFERENTIAL/PLATELET
Abs Immature Granulocytes: 0 10*3/uL (ref 0.00–0.07)
Basophils Absolute: 0 10*3/uL (ref 0.0–0.1)
Basophils Relative: 1 %
Eosinophils Absolute: 0.1 10*3/uL (ref 0.0–0.5)
Eosinophils Relative: 3 %
HCT: 31.4 % — ABNORMAL LOW (ref 39.0–52.0)
Hemoglobin: 10 g/dL — ABNORMAL LOW (ref 13.0–17.0)
Immature Granulocytes: 0 %
Lymphocytes Relative: 64 %
Lymphs Abs: 1.2 10*3/uL (ref 0.7–4.0)
MCH: 30 pg (ref 26.0–34.0)
MCHC: 31.8 g/dL (ref 30.0–36.0)
MCV: 94.3 fL (ref 80.0–100.0)
Monocytes Absolute: 0.1 10*3/uL (ref 0.1–1.0)
Monocytes Relative: 6 %
Neutro Abs: 0.5 10*3/uL — ABNORMAL LOW (ref 1.7–7.7)
Neutrophils Relative %: 26 %
Platelets: 21 10*3/uL — CL (ref 150–400)
RBC: 3.33 MIL/uL — ABNORMAL LOW (ref 4.22–5.81)
RDW: 19.7 % — ABNORMAL HIGH (ref 11.5–15.5)
WBC: 1.9 10*3/uL — ABNORMAL LOW (ref 4.0–10.5)
nRBC: 0 % (ref 0.0–0.2)

## 2021-07-19 LAB — I-STAT VENOUS BLOOD GAS, ED
Acid-base deficit: 1 mmol/L (ref 0.0–2.0)
Bicarbonate: 23.7 mmol/L (ref 20.0–28.0)
Calcium, Ion: 0.98 mmol/L — ABNORMAL LOW (ref 1.15–1.40)
HCT: 26 % — ABNORMAL LOW (ref 39.0–52.0)
Hemoglobin: 8.8 g/dL — ABNORMAL LOW (ref 13.0–17.0)
O2 Saturation: 97 %
Potassium: 3.5 mmol/L (ref 3.5–5.1)
Sodium: 143 mmol/L (ref 135–145)
TCO2: 25 mmol/L (ref 22–32)
pCO2, Ven: 37.8 mmHg — ABNORMAL LOW (ref 44–60)
pH, Ven: 7.405 (ref 7.25–7.43)
pO2, Ven: 88 mmHg — ABNORMAL HIGH (ref 32–45)

## 2021-07-19 LAB — COMPREHENSIVE METABOLIC PANEL
ALT: 23 U/L (ref 0–44)
AST: 65 U/L — ABNORMAL HIGH (ref 15–41)
Albumin: 2.6 g/dL — ABNORMAL LOW (ref 3.5–5.0)
Alkaline Phosphatase: 91 U/L (ref 38–126)
Anion gap: 10 (ref 5–15)
BUN: <5 mg/dL — ABNORMAL LOW (ref 6–20)
CO2: 18 mmol/L — ABNORMAL LOW (ref 22–32)
Calcium: 7.6 mg/dL — ABNORMAL LOW (ref 8.9–10.3)
Chloride: 110 mmol/L (ref 98–111)
Creatinine, Ser: 0.46 mg/dL — ABNORMAL LOW (ref 0.61–1.24)
GFR, Estimated: >60 mL/min (ref >60)
Glucose, Bld: 103 mg/dL — ABNORMAL HIGH (ref 70–99)
Potassium: 3.3 mmol/L — ABNORMAL LOW (ref 3.5–5.1)
Sodium: 138 mmol/L (ref 135–145)
Total Bilirubin: 2.6 mg/dL — ABNORMAL HIGH (ref 0.3–1.2)
Total Protein: 7.1 g/dL (ref 6.5–8.1)

## 2021-07-19 LAB — URINALYSIS, ROUTINE W REFLEX MICROSCOPIC
Bilirubin Urine: NEGATIVE
Glucose, UA: NEGATIVE mg/dL
Ketones, ur: NEGATIVE mg/dL
Leukocytes,Ua: NEGATIVE
Nitrite: NEGATIVE
Protein, ur: NEGATIVE mg/dL
Specific Gravity, Urine: <1.005 — ABNORMAL LOW (ref 1.005–1.030)
pH: 6 (ref 5.0–8.0)

## 2021-07-19 LAB — CK: Total CK: 229 U/L (ref 49–397)

## 2021-07-19 LAB — ETHANOL: Alcohol, Ethyl (B): 406 mg/dL (ref <10)

## 2021-07-19 LAB — URINALYSIS, MICROSCOPIC (REFLEX): Squamous Epithelial / HPF: NONE SEEN (ref 0–5)

## 2021-07-19 LAB — LIPASE, BLOOD: Lipase: 40 U/L (ref 11–51)

## 2021-07-19 LAB — TROPONIN I (HIGH SENSITIVITY)
Troponin I (High Sensitivity): 8 ng/L (ref <18)
Troponin I (High Sensitivity): 13 ng/L (ref <18)

## 2021-07-19 MED ORDER — IOHEXOL 300 MG/ML  SOLN
100.0000 mL | Freq: Once | INTRAMUSCULAR | Status: AC | PRN
Start: 2021-07-19 — End: 2021-07-19
  Administered 2021-07-19: 100 mL via INTRAVENOUS

## 2021-07-19 MED ORDER — LACTATED RINGERS IV BOLUS
500.0000 mL | Freq: Once | INTRAVENOUS | Status: AC
  Administered 2021-07-19: 500 mL via INTRAVENOUS

## 2021-07-19 MED ORDER — OXYCODONE HCL 5 MG PO TABS
5.0000 mg | ORAL_TABLET | Freq: Once | ORAL | Status: AC
  Administered 2021-07-19: 5 mg via ORAL
  Filled 2021-07-19: qty 1

## 2021-07-19 MED ORDER — THIAMINE HCL 100 MG/ML IJ SOLN
100.0000 mg | Freq: Once | INTRAMUSCULAR | Status: AC
  Administered 2021-07-19: 100 mg via INTRAVENOUS
  Filled 2021-07-19: qty 2
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
## 2021-07-19 NOTE — Discharge Instructions (Addendum)
Because of your liver failure, you are at high risk of bleeding.  If you develop any bleeding, please return to the emergency department.  Also return for any new or worsening symptoms of concern.
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
## 2021-07-21 ENCOUNTER — Other Ambulatory Visit: Payer: Self-pay

## 2021-07-26 ENCOUNTER — Telehealth: Payer: Self-pay | Admitting: Critical Care Medicine

## 2021-07-26 NOTE — Telephone Encounter (Signed)
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

## 2021-07-27 ENCOUNTER — Telehealth: Payer: Self-pay | Admitting: Licensed Clinical Social Worker

## 2021-07-27 NOTE — Telephone Encounter (Signed)
A user error has taken place: encounter opened in error, closed for administrative reasons.

## 2021-07-27 NOTE — Telephone Encounter (Signed)
[facility] called pt twice today w/ interpreter # [phone] and left  brief voicemail asking that he calls back so that we can discuss any resources that maybe beneficial for him at this time.

## 2021-08-09 ENCOUNTER — Ambulatory Visit: Payer: Self-pay | Admitting: Critical Care Medicine

## 2021-08-16 ENCOUNTER — Ambulatory Visit: Payer: Self-pay

## 2021-08-16 NOTE — Telephone Encounter (Signed)
I do not work Wednesday afternoons,  his appt is Aug 16 pm   See about doubling and getting him in tomorrow
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
## 2021-08-17 ENCOUNTER — Inpatient Hospital Stay: Payer: Self-pay | Admitting: Critical Care Medicine

## 2021-08-19 NOTE — Telephone Encounter (Signed)
Scheduled an apt for patient Wednesday 8/16 a.m.

## 2021-08-25 ENCOUNTER — Encounter: Payer: Self-pay | Admitting: Critical Care Medicine

## 2021-08-25 ENCOUNTER — Ambulatory Visit (HOSPITAL_COMMUNITY): Admission: RE | Admit: 2021-08-25 | Payer: Self-pay | Source: Ambulatory Visit

## 2021-08-25 ENCOUNTER — Telehealth: Payer: Self-pay

## 2021-08-25 ENCOUNTER — Ambulatory Visit: Payer: Self-pay | Admitting: Licensed Clinical Social Worker

## 2021-08-25 ENCOUNTER — Ambulatory Visit: Payer: Self-pay | Admitting: Critical Care Medicine

## 2021-08-25 ENCOUNTER — Ambulatory Visit: Payer: Self-pay | Attending: Critical Care Medicine | Admitting: Critical Care Medicine

## 2021-08-25 ENCOUNTER — Other Ambulatory Visit: Payer: Self-pay

## 2021-08-25 ENCOUNTER — Encounter (HOSPITAL_COMMUNITY): Payer: Self-pay

## 2021-08-25 VITALS — BP 117/78 | HR 81 | Temp 97.4°F | Ht 65.0 in | Wt 181.4 lb

## 2021-08-25 DIAGNOSIS — E8809 Other disorders of plasma-protein metabolism, not elsewhere classified: Secondary | ICD-10-CM

## 2021-08-25 DIAGNOSIS — K7030 Alcoholic cirrhosis of liver without ascites: Secondary | ICD-10-CM

## 2021-08-25 DIAGNOSIS — F1010 Alcohol abuse, uncomplicated: Secondary | ICD-10-CM

## 2021-08-25 DIAGNOSIS — D689 Coagulation defect, unspecified: Secondary | ICD-10-CM

## 2021-08-25 DIAGNOSIS — D61818 Other pancytopenia: Secondary | ICD-10-CM

## 2021-08-25 DIAGNOSIS — E559 Vitamin D deficiency, unspecified: Secondary | ICD-10-CM

## 2021-08-25 DIAGNOSIS — K429 Umbilical hernia without obstruction or gangrene: Secondary | ICD-10-CM

## 2021-08-25 DIAGNOSIS — D509 Iron deficiency anemia, unspecified: Secondary | ICD-10-CM

## 2021-08-25 DIAGNOSIS — K7031 Alcoholic cirrhosis of liver with ascites: Secondary | ICD-10-CM

## 2021-08-25 DIAGNOSIS — R1013 Epigastric pain: Secondary | ICD-10-CM

## 2021-08-25 MED ORDER — PANTOPRAZOLE SODIUM 40 MG PO TBEC
40.0000 mg | DELAYED_RELEASE_TABLET | Freq: Every day | ORAL | 1 refills | Status: DC
Start: 2021-08-25 — End: 2021-11-11
  Filled 2021-08-25: qty 30, 30d supply, fill #0
  Filled 2021-10-12: qty 30, 30d supply, fill #1

## 2021-08-25 MED ORDER — FERROUS SULFATE 325 (65 FE) MG PO TABS
325.0000 mg | ORAL_TABLET | Freq: Every day | ORAL | 3 refills | Status: DC
  Filled 2021-08-25: qty 30, 30d supply, fill #0
  Filled 2021-10-12: qty 30, 30d supply, fill #1
  Filled 2021-11-11: qty 30, 30d supply, fill #2
  Filled 2021-12-15: qty 30, 30d supply, fill #3

## 2021-08-25 MED ORDER — RIFAXIMIN 550 MG PO TABS
550.0000 mg | ORAL_TABLET | Freq: Two times a day (BID) | ORAL | 1 refills | Status: DC
  Filled 2021-08-25 – 2021-08-31 (×3): qty 60, 30d supply, fill #0

## 2021-08-25 MED ORDER — FUROSEMIDE 40 MG PO TABS
40.0000 mg | ORAL_TABLET | Freq: Every day | ORAL | 1 refills | Status: DC
  Filled 2021-08-25: qty 30, 30d supply, fill #0
  Filled 2021-10-12: qty 30, 30d supply, fill #1

## 2021-08-25 MED ORDER — LACTULOSE 10 GM/15ML PO SOLN
30.0000 g | Freq: Three times a day (TID) | ORAL | 2 refills | Status: DC
  Filled 2021-08-25: qty 2000, 15d supply, fill #0
  Filled 2021-10-12: qty 2000, 15d supply, fill #1

## 2021-08-25 MED ORDER — FOLIC ACID 1 MG PO TABS
1.0000 mg | ORAL_TABLET | Freq: Every day | ORAL | 1 refills | Status: DC
  Filled 2021-08-25: qty 30, 30d supply, fill #0
  Filled 2021-10-12: qty 30, 30d supply, fill #1
  Filled 2021-11-11: qty 30, 30d supply, fill #2
  Filled 2021-12-15: qty 30, 30d supply, fill #3

## 2021-08-25 MED ORDER — SPIRONOLACTONE 100 MG PO TABS
100.0000 mg | ORAL_TABLET | Freq: Every day | ORAL | 1 refills | Status: DC
  Filled 2021-08-25: qty 30, 30d supply, fill #0
  Filled 2021-10-12: qty 30, 30d supply, fill #1

## 2021-08-25 MED ORDER — ADULT MULTIVITAMIN W/MINERALS CH
1.0000 | ORAL_TABLET | Freq: Every day | ORAL | Status: DC
Start: 2021-08-25 — End: 2022-02-20

## 2021-08-25 MED ORDER — THIAMINE HCL 100 MG PO TABS
100.0000 mg | ORAL_TABLET | Freq: Every day | ORAL | 2 refills | Status: DC
  Filled 2021-08-25: qty 60, 60d supply, fill #0
  Filled 2021-11-11: qty 100, 100d supply, fill #0

## 2021-08-25 MED ORDER — PROPRANOLOL HCL 10 MG PO TABS
10.0000 mg | ORAL_TABLET | Freq: Two times a day (BID) | ORAL | 1 refills | Status: DC
  Filled 2021-08-25: qty 60, 30d supply, fill #0
  Filled 2021-10-12: qty 60, 30d supply, fill #1

## 2021-08-25 NOTE — Progress Notes (Signed)
Pt states that he has pain in Rt leg & foot. Pt states pain discomfort in stomach.

## 2021-08-25 NOTE — Assessment & Plan Note (Signed)
Continue observation, he is not a surgical candidate per general surgery. Patient is still drinking alcohol, worsening his ascites and therefore worsening hernia symptoms. Reiterated this to patient.

## 2021-08-25 NOTE — Patient Instructions (Signed)
Medications have been refilled and sent to the pharmacy downstairs  Please pick up your medications and start them today  Our social worker Mathew [name] will be contacting you about alcohol rehab  We are getting a procedure scheduled to have drainage taken from your abdomen to remove fluid  We will contact you with the lab results  Next visit to the clinic is pending your results if you get into rehab or not we will contact you with an appointment  Los medicamentos han sido reabastecidos y enviados a Mathew farmacia de abajo.  Recoja sus medicamentos y comience a tomarlos hoy  [place] social, Mathew [name], se comunicar con usted sobre Mathew rehabilitacin de alcohol.  Estamos recibiendo un procedimiento programado para que le extraigan drenaje de su abdomen para eliminar el lquido.  Nos pondremos en contacto con usted con los resultados de laboratorio.  Mathew prxima visita a Mathew clnica est pendiente de sus resultados, si ingresa a rehabilitacin o no, lo contactaremos para programar una cita.
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
## 2021-08-25 NOTE — Assessment & Plan Note (Signed)
Continue daily supplement, refills sent to downstairs pharmacy

## 2021-08-25 NOTE — Assessment & Plan Note (Signed)
Profound ascites on exam. Will schedule patient for outpatient paracentesis. Patient is still drinking. Our clinical social worker has connect with patient and is in the process of scheduling him for detoxification and rehabilitation.  All medications have been refilled and sent downstairs. Patient did not pick them up after the last visit. Emphasized importance of taking these medications. He has conveyed understanding.

## 2021-08-25 NOTE — Assessment & Plan Note (Signed)
Expressed importance of cessation to patient given his current medical condition. He has expressed understanding. Toni Amend, our clinical social worker, talked to patient today and explained process of getting him into detox and rehabilitation, she has initiated this. She will call patient this week with placement details. He is to ensure his own transportation to the location.

## 2021-08-26 LAB — COMPREHENSIVE METABOLIC PANEL
ALT: 20 IU/L (ref 0–44)
AST: 51 IU/L — ABNORMAL HIGH (ref 0–40)
Albumin/Globulin Ratio: 0.8 — ABNORMAL LOW (ref 1.2–2.2)
Albumin: 2.8 g/dL — ABNORMAL LOW (ref 4.1–5.1)
Alkaline Phosphatase: 134 IU/L — ABNORMAL HIGH (ref 44–121)
BUN/Creatinine Ratio: 12 (ref 9–20)
BUN: 8 mg/dL (ref 6–24)
Bilirubin Total: 4.8 mg/dL — ABNORMAL HIGH (ref 0.0–1.2)
CO2: 18 mmol/L — ABNORMAL LOW (ref 20–29)
Calcium: 8.1 mg/dL — ABNORMAL LOW (ref 8.7–10.2)
Chloride: 109 mmol/L — ABNORMAL HIGH (ref 96–106)
Creatinine, Ser: 0.65 mg/dL — ABNORMAL LOW (ref 0.76–1.27)
Globulin, Total: 3.6 g/dL (ref 1.5–4.5)
Glucose: 96 mg/dL (ref 70–99)
Potassium: 4.4 mmol/L (ref 3.5–5.2)
Sodium: 140 mmol/L (ref 134–144)
Total Protein: 6.4 g/dL (ref 6.0–8.5)
eGFR: 120 mL/min/{1.73_m2} (ref >59)

## 2021-08-26 LAB — PROTIME-INR
INR: 1.8 — ABNORMAL HIGH (ref 0.9–1.2)
Prothrombin Time: 18.8 s — ABNORMAL HIGH (ref 9.1–12.0)

## 2021-08-26 LAB — CBC WITH DIFFERENTIAL/PLATELET
Basophils Absolute: 0 10*3/uL (ref 0.0–0.2)
Basos: 1 %
EOS (ABSOLUTE): 0 10*3/uL (ref 0.0–0.4)
Eos: 2 %
Hematocrit: 27.2 % — ABNORMAL LOW (ref 37.5–51.0)
Hemoglobin: 9.7 g/dL — ABNORMAL LOW (ref 13.0–17.7)
Immature Grans (Abs): 0 10*3/uL (ref 0.0–0.1)
Immature Granulocytes: 1 %
Lymphocytes Absolute: 0.5 10*3/uL — ABNORMAL LOW (ref 0.7–3.1)
Lymphs: 28 %
MCH: 31.1 pg (ref 26.6–33.0)
MCHC: 35.7 g/dL (ref 31.5–35.7)
MCV: 87 fL (ref 79–97)
Monocytes Absolute: 0.2 10*3/uL (ref 0.1–0.9)
Monocytes: 13 %
Neutrophils Absolute: 1 10*3/uL — ABNORMAL LOW (ref 1.4–7.0)
Neutrophils: 55 %
Platelets: 38 10*3/uL — CL (ref 150–450)
RBC: 3.12 x10E6/uL — ABNORMAL LOW (ref 4.14–5.80)
RDW: 15.6 % — ABNORMAL HIGH (ref 11.6–15.4)
WBC: 1.8 10*3/uL — CL (ref 3.4–10.8)

## 2021-08-26 LAB — LIPASE: Lipase: 52 U/L (ref 13–78)

## 2021-08-26 NOTE — Progress Notes (Signed)
Let pt know blood counts unchanged low due to alcohol use .  Blood clotting is ok, liver function high d/t alcohol, no pancrease problem

## 2021-08-26 NOTE — Telephone Encounter (Signed)
Thanks, Noted.----DD,RMA

## 2021-08-26 NOTE — Telephone Encounter (Signed)
LCSWA met with pt yesterday during a warm hand off 8/16 to introduce herself and share the process for detox and rehab for his alcohol abuse. LCSWA will contact several resources who can best support him and follow up with patient.

## 2021-08-27 ENCOUNTER — Encounter: Payer: Self-pay | Admitting: Licensed Clinical Social Worker

## 2021-08-27 ENCOUNTER — Telehealth: Payer: Self-pay

## 2021-08-27 ENCOUNTER — Ambulatory Visit: Payer: Self-pay | Admitting: Licensed Clinical Social Worker

## 2021-08-27 NOTE — Progress Notes (Signed)
A user error has taken place: encounter opened in error, closed for administrative reasons.

## 2021-08-27 NOTE — Telephone Encounter (Signed)
-----   Message from [name], MD sent at 08/26/2021  8:46 AM EDT ----- Let pt know blood counts unchanged low due to alcohol use .  Blood clotting is ok, liver function high d/t alcohol, no pancrease problem

## 2021-08-27 NOTE — Telephone Encounter (Signed)
Called patient (both number )but was unable to make contact or leave voicemail.  Interpreter [phone]

## 2021-08-27 NOTE — Telephone Encounter (Addendum)
Integrated Behavioral Health Behavioral Health Screening    08/27/2021 Name: DOLTON SHAKER [place] MRN: [id number] DOB: October 29, 1980 Noah Charon [name] is a 41 y.o. year old male who sees [name], MD for primary care. LCSWA was consulted to assess mental health needs and assist the patient with [facility]  and a referral to detox at [facility] in [facility] as discussed in his visit on 8/16 . Assessed thoughts of SI, plan and access to means.  Interpreter: Yes.     Interpreter Name & Language: [phone]- Spanish   SUBJECTIVE: Presenting issue / symptoms/concerns: pt abuses alcohol, when calling pt he barely responded and seemed as if he was not focusing  Duration of symptoms/ how impacting : Patient has been struggling with alcohol abuse for about 5 years. Recent life changes: none reported  Family / Social support: lives with aunt and often receives support from brother  Mood: Hopeless  I spoke with pt today via telephone using interpreted ([id number]) Introduced myself, reminded him that we spoke this week and I had a plan for him to detox. I provided pt with [facility] [address] [place], [place] [phone] or [phone] 24hrs a day. I suggested that his brother or aunt take him. He stated he would go Monday because everyone is working. I reminded him it is 24 hours and he can go anytime when they get off work or this weekend. I stated that they take walk-ins and it would be free for him without insurance. He would just need to bring his medications. The interpreter had to repeat things multiple times for the pt, he often would not respond as if he was not coherent. When I asked him to write the information down, he said he didn't have anything right now. I asked him to get a friend to help him right down the information. Canon did, I'm hoping we will arrive at [facility] today as directed.      Reginia Naas, LCSWA [facility]  Primary Care  at [facility]  Mobile Medical Units Direct Line: [phone] or [phone] Email: [email or web address]

## 2021-08-27 NOTE — BH Specialist Note (Unsigned)
Integrated Behavioral Health Initial In-Person Visit  MRN: [id number] Name: Mathew [name]   Types of Service: Introduction only  Interpretor:Yes.   Interpretor Name and Language: Spanish   Warm Hand Off Completed.      LCSWA met with pt during a warm hand off 8/16 to introduce herself and share the process for detox and rehab for his alcohol abuse. LCSWA will contact several resources who can best support him and follow up with patient.    Shann Medal, LCSW

## 2021-08-27 NOTE — BH Specialist Note (Signed)
A user error has taken place: encounter opened in error, closed for administrative reasons.

## 2021-08-30 ENCOUNTER — Ambulatory Visit (HOSPITAL_COMMUNITY)
Admission: RE | Admit: 2021-08-30 | Discharge: 2021-08-30 | Disposition: A | Discharge status: Home / Self Care | Payer: Self-pay | Source: Ambulatory Visit | Attending: Critical Care Medicine | Admitting: Critical Care Medicine

## 2021-08-30 DIAGNOSIS — K7031 Alcoholic cirrhosis of liver with ascites: Secondary | ICD-10-CM | POA: Insufficient documentation

## 2021-08-30 HISTORY — PX: IR PARACENTESIS: IMG2679

## 2021-08-30 MED ORDER — LIDOCAINE HCL 1 % IJ SOLN
INTRAMUSCULAR | Status: AC
  Filled 2021-08-30: qty 20

## 2021-08-30 MED ORDER — LIDOCAINE HCL (PF) 1 % IJ SOLN
INTRAMUSCULAR | Status: DC | PRN
  Administered 2021-08-30: 10 mL

## 2021-08-30 NOTE — Progress Notes (Signed)
noted 

## 2021-08-30 NOTE — Procedures (Signed)
PROCEDURE SUMMARY:  Successful ultrasound guided therapeutic paracentesis from the RLQ. Yielded 1.4 L  of clear, yellow fluid.  No immediate complications.  The patient tolerated the procedure well.   Specimen not sent for labs.  EBL < 1 mL  If the patient eventually requires >/=2 paracenteses in a 30 day period, screening evaluation by the [facility] Interventional Radiology [facility] Hypert[facility] will be assessed.     Alex Gardener, AGNP-BC 08/30/2021, 9:54 AM

## 2021-08-31 ENCOUNTER — Other Ambulatory Visit: Payer: Self-pay

## 2021-09-01 ENCOUNTER — Emergency Department (HOSPITAL_COMMUNITY): Payer: Self-pay

## 2021-09-01 ENCOUNTER — Inpatient Hospital Stay (HOSPITAL_COMMUNITY)
Admission: EM | Admit: 2021-09-01 | Discharge: 2021-09-03 | DRG: 433 | Disposition: A | Discharge status: Home / Self Care | Payer: Self-pay | Attending: Family Medicine | Admitting: Family Medicine

## 2021-09-01 ENCOUNTER — Encounter (HOSPITAL_COMMUNITY): Payer: Self-pay | Admitting: Emergency Medicine

## 2021-09-01 DIAGNOSIS — G8929 Other chronic pain: Secondary | ICD-10-CM | POA: Diagnosis present

## 2021-09-01 DIAGNOSIS — K7010 Alcoholic hepatitis without ascites: Secondary | ICD-10-CM | POA: Diagnosis present

## 2021-09-01 DIAGNOSIS — Z20822 Contact with and (suspected) exposure to covid-19: Secondary | ICD-10-CM | POA: Diagnosis present

## 2021-09-01 DIAGNOSIS — Z79899 Other long term (current) drug therapy: Secondary | ICD-10-CM

## 2021-09-01 DIAGNOSIS — R1084 Generalized abdominal pain: Principal | ICD-10-CM

## 2021-09-01 DIAGNOSIS — K7031 Alcoholic cirrhosis of liver with ascites: Principal | ICD-10-CM

## 2021-09-01 DIAGNOSIS — E559 Vitamin D deficiency, unspecified: Secondary | ICD-10-CM | POA: Diagnosis present

## 2021-09-01 DIAGNOSIS — E876 Hypokalemia: Secondary | ICD-10-CM | POA: Diagnosis present

## 2021-09-01 DIAGNOSIS — K8020 Calculus of gallbladder without cholecystitis without obstruction: Secondary | ICD-10-CM | POA: Diagnosis present

## 2021-09-01 DIAGNOSIS — D61818 Other pancytopenia: Secondary | ICD-10-CM | POA: Diagnosis present

## 2021-09-01 DIAGNOSIS — E871 Hypo-osmolality and hyponatremia: Secondary | ICD-10-CM

## 2021-09-01 DIAGNOSIS — K7030 Alcoholic cirrhosis of liver without ascites: Secondary | ICD-10-CM | POA: Diagnosis present

## 2021-09-01 DIAGNOSIS — K7460 Unspecified cirrhosis of liver: Secondary | ICD-10-CM | POA: Diagnosis present

## 2021-09-01 DIAGNOSIS — F1090 Alcohol use, unspecified, uncomplicated: Secondary | ICD-10-CM

## 2021-09-01 DIAGNOSIS — F1010 Alcohol abuse, uncomplicated: Secondary | ICD-10-CM | POA: Diagnosis present

## 2021-09-01 DIAGNOSIS — K429 Umbilical hernia without obstruction or gangrene: Secondary | ICD-10-CM

## 2021-09-01 DIAGNOSIS — K529 Noninfective gastroenteritis and colitis, unspecified: Secondary | ICD-10-CM

## 2021-09-01 DIAGNOSIS — J90 Pleural effusion, not elsewhere classified: Secondary | ICD-10-CM | POA: Diagnosis present

## 2021-09-01 LAB — COMPREHENSIVE METABOLIC PANEL
ALT: 33 U/L (ref 0–44)
AST: 74 U/L — ABNORMAL HIGH (ref 15–41)
Albumin: 2.7 g/dL — ABNORMAL LOW (ref 3.5–5.0)
Alkaline Phosphatase: 134 U/L — ABNORMAL HIGH (ref 38–126)
Anion gap: 7 (ref 5–15)
BUN: <5 mg/dL — ABNORMAL LOW (ref 6–20)
CO2: 19 mmol/L — ABNORMAL LOW (ref 22–32)
Calcium: 7.9 mg/dL — ABNORMAL LOW (ref 8.9–10.3)
Chloride: 105 mmol/L (ref 98–111)
Creatinine, Ser: 0.65 mg/dL (ref 0.61–1.24)
GFR, Estimated: >60 mL/min (ref >60)
Glucose, Bld: 102 mg/dL — ABNORMAL HIGH (ref 70–99)
Potassium: 3 mmol/L — ABNORMAL LOW (ref 3.5–5.1)
Sodium: 131 mmol/L — ABNORMAL LOW (ref 135–145)
Total Bilirubin: 3.1 mg/dL — ABNORMAL HIGH (ref 0.3–1.2)
Total Protein: 7.1 g/dL (ref 6.5–8.1)

## 2021-09-01 LAB — URINALYSIS, ROUTINE W REFLEX MICROSCOPIC
Bilirubin Urine: NEGATIVE
Glucose, UA: NEGATIVE mg/dL
Ketones, ur: NEGATIVE mg/dL
Leukocytes,Ua: NEGATIVE
Nitrite: NEGATIVE
Protein, ur: NEGATIVE mg/dL
Specific Gravity, Urine: 1.001 — ABNORMAL LOW (ref 1.005–1.030)
pH: 5 (ref 5.0–8.0)

## 2021-09-01 LAB — MAGNESIUM: Magnesium: 1.6 mg/dL — ABNORMAL LOW (ref 1.7–2.4)

## 2021-09-01 LAB — CBC
HCT: 34.1 % — ABNORMAL LOW (ref 39.0–52.0)
Hemoglobin: 11.2 g/dL — ABNORMAL LOW (ref 13.0–17.0)
MCH: 30.7 pg (ref 26.0–34.0)
MCHC: 32.8 g/dL (ref 30.0–36.0)
MCV: 93.4 fL (ref 80.0–100.0)
Platelets: 44 10*3/uL — ABNORMAL LOW (ref 150–400)
RBC: 3.65 MIL/uL — ABNORMAL LOW (ref 4.22–5.81)
RDW: 15.7 % — ABNORMAL HIGH (ref 11.5–15.5)
WBC: 3 10*3/uL — ABNORMAL LOW (ref 4.0–10.5)
nRBC: 0 % (ref 0.0–0.2)

## 2021-09-01 LAB — ETHANOL: Alcohol, Ethyl (B): <10 mg/dL (ref <10)

## 2021-09-01 LAB — LIPASE, BLOOD: Lipase: 59 U/L — ABNORMAL HIGH (ref 11–51)

## 2021-09-01 LAB — PROTIME-INR
INR: 2.2 — ABNORMAL HIGH (ref 0.8–1.2)
Prothrombin Time: 24.2 seconds — ABNORMAL HIGH (ref 11.4–15.2)

## 2021-09-01 MED ORDER — NICOTINE 14 MG/24HR TD PT24
14.0000 mg | MEDICATED_PATCH | Freq: Every day | TRANSDERMAL | Status: DC
  Administered 2021-09-02 – 2021-09-03 (×2): 14 mg via TRANSDERMAL
  Filled 2021-09-01 (×2): qty 1

## 2021-09-01 MED ORDER — THIAMINE HCL 100 MG PO TABS
100.0000 mg | ORAL_TABLET | Freq: Every day | ORAL | Status: DC
  Administered 2021-09-02 – 2021-09-03 (×2): 100 mg via ORAL
  Filled 2021-09-01 (×3): qty 1

## 2021-09-01 MED ORDER — LORAZEPAM 1 MG PO TABS: 1.0000 mg | ORAL_TABLET | ORAL | Status: DC | PRN

## 2021-09-01 MED ORDER — ONDANSETRON HCL 4 MG/2ML IJ SOLN
4.0000 mg | Freq: Once | INTRAMUSCULAR | Status: AC
  Administered 2021-09-01: 4 mg via INTRAVENOUS
  Filled 2021-09-01: qty 2

## 2021-09-01 MED ORDER — MORPHINE SULFATE (PF) 4 MG/ML IV SOLN
4.0000 mg | Freq: Once | INTRAVENOUS | Status: AC
  Administered 2021-09-01: 4 mg via INTRAVENOUS
  Filled 2021-09-01: qty 1

## 2021-09-01 MED ORDER — SPIRONOLACTONE 100 MG PO TABS
100.0000 mg | ORAL_TABLET | Freq: Every day | ORAL | Status: DC
  Administered 2021-09-03: 100 mg via ORAL
  Filled 2021-09-01 (×2): qty 1

## 2021-09-01 MED ORDER — LORAZEPAM 2 MG/ML IJ SOLN: 1.0000 mg | INTRAMUSCULAR | Status: DC | PRN

## 2021-09-01 MED ORDER — IOHEXOL 300 MG/ML  SOLN
85.0000 mL | Freq: Once | INTRAMUSCULAR | Status: AC | PRN
  Administered 2021-09-01: 85 mL via INTRAVENOUS

## 2021-09-01 MED ORDER — FOLIC ACID 1 MG PO TABS
1.0000 mg | ORAL_TABLET | Freq: Every day | ORAL | Status: DC
  Administered 2021-09-02 – 2021-09-03 (×2): 1 mg via ORAL
  Filled 2021-09-01 (×2): qty 1

## 2021-09-01 MED ORDER — ADULT MULTIVITAMIN W/MINERALS CH
1.0000 | ORAL_TABLET | Freq: Every day | ORAL | Status: DC
  Administered 2021-09-02 – 2021-09-03 (×2): 1 via ORAL
  Filled 2021-09-01 (×2): qty 1

## 2021-09-01 MED ORDER — FUROSEMIDE 40 MG PO TABS
40.0000 mg | ORAL_TABLET | Freq: Every day | ORAL | Status: DC
  Administered 2021-09-03: 40 mg via ORAL
  Filled 2021-09-01 (×2): qty 1

## 2021-09-01 MED ORDER — POTASSIUM CHLORIDE CRYS ER 20 MEQ PO TBCR
40.0000 meq | EXTENDED_RELEASE_TABLET | Freq: Once | ORAL | Status: AC
  Administered 2021-09-01: 40 meq via ORAL
  Filled 2021-09-01: qty 2

## 2021-09-01 MED ORDER — MAGNESIUM SULFATE 2 GM/50ML IV SOLN
2.0000 g | Freq: Once | INTRAVENOUS | Status: AC
  Administered 2021-09-02: 2 g via INTRAVENOUS
  Filled 2021-09-01: qty 50

## 2021-09-01 MED ORDER — SODIUM CHLORIDE 0.9 % IV SOLN
1.0000 g | Freq: Once | INTRAVENOUS | Status: AC
  Administered 2021-09-01: 1 g via INTRAVENOUS
  Filled 2021-09-01: qty 10

## 2021-09-01 NOTE — ED Provider Triage Note (Signed)
Emergency Medicine Provider Triage Evaluation Note  Mathew [name] [place] , a 41 y.o. male  was evaluated in triage.  Pt complains of abdominal pain for numerous weeks which worsened last night.  Patient has a umbilical hernia.  Admits to some nausea, vomiting, diarrhea.  History of alcoholic cirrhosis.  Review of Systems  Positive: Abdominal pain Negative: fever  Physical Exam  BP 129/87 (BP Location: Right Arm)   Pulse 65   Temp 97.8 F (36.6 C) (Oral)   Resp 16   SpO2 100%  Gen:   Awake, no distress   Resp:  Normal effort  MSK:   Moves extremities without difficulty  Other:  Umbilical hernia  Medical Decision Making  Medically screening exam initiated at 1:18 PM.  Appropriate orders placed.  Mathew [name] was informed that the remainder of the evaluation will be completed by another provider, this initial triage assessment does not replace that evaluation, and the importance of remaining in the ED until their evaluation is complete.  Abdominal labs ordered   Mathew [name] 09/01/21 1319

## 2021-09-01 NOTE — ED Provider Notes (Signed)
[facility] EMERGENCY DEPARTMENT Provider Note   CSN: [id number] Arrival date & time: 09/01/21  1214     History  Chief Complaint  Patient presents with   Abdominal Pain    Eutimio Gharibian [name] is a 41 y.o. male.  41 year old male with complaint of generalized abdominal pain, ongoing but worse since last night. Had vomiting 2 days ago, none since, also diarrhea but this is chronic. Denies fevers, chills. History of alcoholic cirrhosis, had paracentesis 2 days ago without complications, last had a beer 2 days ago.   A language interpreter was used ([place]).  Abdominal Pain      Home Medications Prior to Admission medications   Medication Sig Start Date End Date Taking? Authorizing Provider  acetaminophen (TYLENOL) 500 MG tablet Take 1 tablet (500 mg total) by mouth every 6 (six) hours as needed for mild pain. Do not exceed more than 2000 mg a day Patient not taking: Reported on 08/25/2021 06/20/21   Rodolph Bong, MD  ferrous sulfate 325 (65 FE) MG tablet Take 1 tablet (325 mg total) by mouth daily with breakfast. 08/25/21   [name], MD  folic acid (FOLVITE) 1 MG tablet Take 1 tablet (1 mg total) by mouth daily. 08/25/21 08/25/22  [name], MD  furosemide (LASIX) 40 MG tablet Take 1 tablet (40 mg total) by mouth daily. 08/25/21   [name], MD  lactulose (CHRONULAC) 10 GM/15ML solution Take 45 mLs (30 g total) by mouth 3 (three) times daily. 08/25/21   [name], MD  LORazepam (ATIVAN) 0.5 MG tablet Take 2 tablets 4 times a day for 4 days, 1 tablet 4 times a day for 4 days, 1 tablet 2 times a day for 4 days then 1 tablet daily for 4 days then stop 07/08/21   [name], MD  Multiple Vitamin (MULTIVITAMIN WITH MINERALS) TABS tablet Take 1 tablet by mouth daily. 08/25/21   [name], MD  pantoprazole (PROTONIX) 40 MG tablet Take 1 tablet (40 mg total) by mouth daily at 6 (six) AM. 08/25/21   [name], MD   propranolol (INDERAL) 10 MG tablet Take 1 tablet (10 mg total) by mouth 2 (two) times daily. 08/25/21   [name], MD  rifaximin (XIFAXAN) 550 MG TABS tablet Take 1 tablet (550 mg total) by mouth 2 (two) times daily. 08/25/21   [name], MD  spironolactone (ALDACTONE) 100 MG tablet Take 1 tablet (100 mg total) by mouth daily. 08/25/21   [name], MD  thiamine (VITAMIN B1) 100 MG tablet Take 1 tablet (100 mg total) by mouth daily. 08/25/21   [name], MD  Vitamin D, Ergocalciferol, (DRISDOL) 1.25 MG (50000 UNIT) CAPS capsule Take 1 capsule (50,000 Units total) by mouth every Wednesday. 07/14/21   [name], MD      Allergies    Patient has no known allergies.    Review of Systems   Review of Systems  Gastrointestinal:  Positive for abdominal pain.  Negative except as per HPI  Physical Exam Updated Vital Signs BP (!) 96/56   Pulse 62   Temp 98.3 F (36.8 C) (Oral)   Resp 16   SpO2 100%  Physical Exam Vitals and nursing note reviewed.  Constitutional:      General: He is not in acute distress.    Appearance: He is well-developed. He is not diaphoretic.  HENT:  Head: Normocephalic and atraumatic.  Cardiovascular:     Rate and Rhythm: Normal rate and regular rhythm.     Heart sounds: Normal heart sounds.  Pulmonary:     Effort: Pulmonary effort is normal.     Breath sounds: Normal breath sounds.  Abdominal:     Palpations: Abdomen is soft.     Tenderness: There is generalized abdominal tenderness.     Hernia: A hernia is present. Hernia is present in the umbilical area.     Comments: Mild generalized abdominal discomfort.  Umbilical hernia which is tender to the touch, easily reduced, returns after gentler pressure is released. Reduction of hernia does not resolve abdominal pain.  Skin:    General: Skin is warm and dry.     Findings: No erythema or rash.  Neurological:     Mental Status: He is alert and oriented to person, place, and  time.  Psychiatric:        Behavior: Behavior normal.     ED Results / Procedures / Treatments   Labs (all labs ordered are listed, but only abnormal results are displayed) Labs Reviewed  LIPASE, BLOOD - Abnormal; Notable for the following components:      Result Value   Lipase 59 (*)    All other components within normal limits  COMPREHENSIVE METABOLIC PANEL - Abnormal; Notable for the following components:   Sodium 131 (*)    Potassium 3.0 (*)    CO2 19 (*)    Glucose, Bld 102 (*)    BUN <5 (*)    Calcium 7.9 (*)    Albumin 2.7 (*)    AST 74 (*)    Alkaline Phosphatase 134 (*)    Total Bilirubin 3.1 (*)    All other components within normal limits  CBC - Abnormal; Notable for the following components:   WBC 3.0 (*)    RBC 3.65 (*)    Hemoglobin 11.2 (*)    HCT 34.1 (*)    RDW 15.7 (*)    Platelets 44 (*)    All other components within normal limits  URINALYSIS, ROUTINE W REFLEX MICROSCOPIC - Abnormal; Notable for the following components:   Color, Urine STRAW (*)    Specific Gravity, Urine 1.001 (*)    Hgb urine dipstick SMALL (*)    Bacteria, UA RARE (*)    All other components within normal limits  PROTIME-INR - Abnormal; Notable for the following components:   Prothrombin Time 24.2 (*)    INR 2.2 (*)    All other components within normal limits  MAGNESIUM - Abnormal; Notable for the following components:   Magnesium 1.6 (*)    All other components within normal limits  CULTURE, BLOOD (ROUTINE X 2)  CULTURE, BLOOD (ROUTINE X 2)  ETHANOL  COMPREHENSIVE METABOLIC PANEL  PROTIME-INR  APTT  CBC WITH DIFFERENTIAL/PLATELET  AMMONIA    EKG None  Radiology CT Abdomen Pelvis W Contrast  Result Date: 09/01/2021 CLINICAL DATA:  Abdomen pain EXAM: CT ABDOMEN AND PELVIS WITH CONTRAST TECHNIQUE: Multidetector CT imaging of the abdomen and pelvis was performed using the standard protocol following bolus administration of intravenous contrast. RADIATION DOSE  REDUCTION: This exam was performed according to the departmental dose-optimization program which includes automated exposure control, adjustment of the mA and/or kV according to patient size and/or use of iterative reconstruction technique. CONTRAST:  75mL OMNIPAQUE IOHEXOL 300 MG/ML  SOLN COMPARISON:  CT 07/19/2021, ultrasound 02/23/2021 FINDINGS: Lower chest: Lung bases demonstrate no acute airspace  disease. Trace left-sided effusion Hepatobiliary: Liver cirrhosis.  Gallstones.  No biliary dilatation Pancreas: Unremarkable. No pancreatic ductal dilatation or surrounding inflammatory changes. Spleen: Normal in size without focal abnormality. Adrenals/Urinary Tract: Adrenal glands are unremarkable. Kidneys are normal, without renal calculi, focal lesion, or hydronephrosis. Thick-walled urinary bladder. Stomach/Bowel: Stomach nonenlarged. No dilated small bowel. Negative appendix. Fluid-filled colon without bowel wall thickening. Vascular/Lymphatic: Nonaneurysmal aorta.  No suspicious lymph node Reproductive: Prostate is unremarkable. Other: No free air. Moderate volume ascites throughout the abdomen and pelvis. Small umbilical hernia containing fat and fluid. Musculoskeletal: No acute osseous abnormality IMPRESSION: 1. Liver cirrhosis with moderate volume ascites in the abdomen and pelvis. 2. Gallstones 3. Trace left pleural effusion 4. Small umbilical hernia containing fat and fluid. 5. Thick-walled urinary bladder, correlate for cystitis. Electronically Signed   By: Jasmine Pang M.D.   On: 09/01/2021 19:39    Procedures Procedures    Medications Ordered in ED Medications  multivitamin with minerals tablet 1 tablet (has no administration in time range)  nicotine (NICODERM CQ - dosed in mg/24 hours) patch 14 mg (has no administration in time range)  LORazepam (ATIVAN) tablet 1-4 mg (has no administration in time range)    Or  LORazepam (ATIVAN) injection 1-4 mg (has no administration in time range)   thiamine (VITAMIN B1) tablet 100 mg (has no administration in time range)  folic acid (FOLVITE) tablet 1 mg (has no administration in time range)  magnesium sulfate IVPB 2 g 50 mL (has no administration in time range)  ondansetron (ZOFRAN) injection 4 mg (4 mg Intravenous Given 09/01/21 1728)  morphine (PF) 4 MG/ML injection 4 mg (4 mg Intravenous Given 09/01/21 1728)  iohexol (OMNIPAQUE) 300 MG/ML solution 85 mL (85 mLs Intravenous Contrast Given 09/01/21 1931)  potassium chloride SA (KLOR-CON M) CR tablet 40 mEq (40 mEq Oral Given 09/01/21 2138)  cefTRIAXone (ROCEPHIN) 1 g in sodium chloride 0.9 % 100 mL IVPB (0 g Intravenous Stopped 09/01/21 2254)    ED Course/ Medical Decision Making/ A&P                           Medical Decision Making Amount and/or Complexity of Data Reviewed Labs: ordered. Radiology: ordered. ECG/medicine tests: ordered.  Risk Prescription drug management. Decision regarding hospitalization.   This patient presents to the ED for concern of , abdominal pain, hernia, this involves an extensive number of treatment options, and is a complaint that carries with it a high risk of complications and morbidity.  The differential diagnosis includes but not limited to SBP, ascites, hernia,   Co morbidities that complicate the patient evaluation  Alcoholic cirrhosis, recent paracentesis,   Additional history obtained:  External records from outside source obtained and reviewed including review of procedure note from paracentesis completed 2 days ago, yielding 1.4 L of clear yellow fluid without complications.   Lab Tests:  I Ordered, and personally interpreted labs.  The pertinent results include: CBC with white count 3.0, platelets 44, not significant change from prior.  Urinalysis with rare bacteria and small hemoglobin.  CMP with potassium of 3.0, given replacement.  Bilirubin 3.1, not singular change from prior.  Lipase is mildly elevated 59.  Alcohol  negative.   Imaging Studies ordered:  I ordered imaging studies including CT abdomen pelvis I independently visualized and interpreted imaging which showed cirrhosis with moderate ascites, umbilical hernia containing fat and fluid I agree with the radiologist interpretation   Consultations Obtained:  I requested consultation with the teaching service,  and discussed lab and imaging findings as well as pertinent plan - they recommend: Admission.  Discussed paracentesis for concern for SBP.  Discussed with ER attending, this can be arranged tomorrow with IR   Problem List / ED Course / Critical interventions / Medication management  41 year old male with complaint of generalized abdominal pain, worse since last night with vomiting 2 days ago.  Afebrile, vitals otherwise reassuring.  Mild hypokalemia, replaced orally.  CT scan with ascites and umbilical hernia containing fat and fluid.  His hernia is easily reduced, doubt incarceration.  Discussed with Dr. Freida Busman, ER attending, recommends admission for SBP. I ordered medication including morphine, Zofran, Rocephin for pain, nausea, SBP Reevaluation of the patient after these medicines showed that the patient improved I have reviewed the patients home medicines and have made adjustments as needed   Social Determinants of Health:  Spanish speaking    Test / Admission - Considered:  Admit for further work-up with concern for SBP         Final Clinical Impression(s) / ED Diagnoses Final diagnoses:  Generalized abdominal pain  Hypokalemia    Rx / DC Orders ED Discharge Orders     None         Alden Hipp 09/01/21 2259    Lorre Nick, MD 09/03/21 2110

## 2021-09-01 NOTE — ED Triage Notes (Signed)
Patient with hernia here with complaint of abdominal pain for several weeks that got worse last night. Patient is alert, oriented, ambulatory, and in no apparent distress a this time.

## 2021-09-01 NOTE — Assessment & Plan Note (Addendum)
Child-Pugh score 11 points  MELD score 24 on admission (19.6% 3 month mortality), down to 19 today (6% 3 month mortality) Discriminant function score 57.4 Paracentesis performed 08/30/21 at outpatient IR clinic, yielding 1.4 liters of peritoneal fluid. Per Dr. Reesa Chew at this clinic, he eventually requires >/=2 paracenteses in a 30 day period, candidacy for formal evaluation by the [facility] will be assessed. Has had 2 paracenteses this year 08/30/21 and 06/17/21. One in 2022 and one in 2020. Known h/o varices as well. On admission, AST 74, Tbili 3.1, alk phos 134.  - restart home lactulose as diarrhea has resolved - restart home propranolol as pressures around 120/80  - continue spironolactone 100 mg, furosemide 40 mg  - continue home rifaximin 550 mg BID - Paracentesis yielded 2.0 L of fluid. 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 Joya Gaskins for [place] score and determination of need to continue prednisone for alcoholic hepatitis

## 2021-09-01 NOTE — H&P (Shared)
Hospital Admission History and Physical Service Pager: [phone]  Patient name: Mathew [name] [facility] Medical record number: [id number] Date of Birth: January 07, 1981 Age: 41 y.o. Gender: male  Primary Care Provider: [name], MD Consultants: None Code Status: Full Preferred Emergency Contact: Ladon Applebaum, brother, [phone]  Chief Complaint: abdominal pain  Assessment and Plan: Mathew [name] is a 41 y.o. male presenting with generalized abdominal pain for the past ~1 month and n/v/d for 3 days - abdominal pain worsened last night, likely secondary to decompensated alcoholic liver cirrhosis with ascites which was correlated radiographically, superimposed on gastroenteritis and painful umbilical hernia. Relevant PMHx includes alcohol use disorder, alcohol induced liver cirrhosis. Patient also has an umbilical hernia which has been present for a long time which frequently causes him pain.  Other differential for presentation of abdominal pain includes pancreatitis which is unlikely given lipase < 3x normal, no signs on radiological imaging.   Can't miss diagnoses include intestinal obstruction which is unlikely given diarrhea, and peritonitis, which is unlikely given by absence of free fluid and air in peritoneal cavity on CT.  * Alcoholic cirrhosis of liver with ascites (HCC) MELD score 24 on admission (19.6% 3 month mortality). Known h/o varices as well. Paracentesis performed 08/30/21 at outpatient IR clinic, yielding 1.4 liters of peritoneal fluid. On admission, AST 74, Tbili 3.1, alk phos 134. CT shows liver cirrhosis with moderate volume ascites in the abdomen and pelvis, paracentesis ordered. Doubt SBP although patient did received Ceftriaxone x1 in the ED. - admit to [facility] med-tele, attending Dr. Gwendlyn Deutscher - on CCM - daily weights, strict I&Os - regular diet - held home lactulose due to diarrhea - hold home propranolol for now due to soft BP - continue home  spironolactone 100 mg, furosemide 40 mg - continue home rifaximin 550 mg BID - f/u paracentesis  Alcohol use disorder Reports beer consumption between 24 oz - 40 oz daily. Last reported drink was 8/20 in the afternoon. Denies history of alcohol withdrawal symptoms (tremors, hallucinations, seizures). Expresses desire to quit. - continue CIWA protocol - consider acamprosate for alcohol cessation - thiamine, folate, MVI daily - TOC for substance use resources  Gastroenteritis Non-bloody vomiting x 3 days, ongoing non-bloody diarrhea. Reports close contact (aunt) having same symptoms. Likely resolving. - held home lactulose - supportive care  Pancytopenia (HCC) Chronic. Likely secondary to liver cirrhosis. Platelet count 44k. No evidence of bleeding. Hb goal > 7.0 - continue SCDs for VTE ppx - daily CBC  Hyponatremia Na 131 in the setting of chronic alcohol use and cirrhosis. - encourage alcohol cessation - encourage proper nutrition  Umbilical hernia Present on admission. Chronic, reducible. Not deemed to require corrective intervention. On CT abd pelvis, small umbilical hernia containing fat and fluid. Patient reports chronic pain at site of hernia - no corrective intervention at this time   FEN/GI: Regular diet VTE Prophylaxis: SCDs  Disposition: pending clinical improvement  History of Present Illness:  Mathew [name] is a Spanish speaking 41 y.o. male presenting with painful umbilical hernia  Patient endorses abdominal pain for the past ~1 month, which he attributes to his hernia. He also endorses vomiting x3 days (which has reportedly resolved the day before yesterday), as well as diarrhea (which is still ongoing) at the same time the vomiting started. Patient reports his aunt was sick with similar symptoms around the same time. He denies seeing blood in his vomit or stool.  Patient states he drinks one  40oz beer per day over the past year, but later states he drinks  one or two 12oz beers per day. Last drink was reportedly 3 days ago (8/20) in the afternoon. Patient expresses desire to quit. He denies a prior history of alcohol withdrawal symptoms (tremors, hallucinations, seizures).  In the ED, patient was admitted and labs (urinalysis, CMP, CBC, lipase) obtained. Given ondansetron and morphine. CT abdomen pelvis w contrast was obtained which showed liver cirrhosis with moderate volume ascites in the abdomen and pelvis, gallstones, trace left pleural effusion, small umbilical hernia containing fat and fluid and thick-walled urinary bladder.  Review Of Systems: Per HPI  Pertinent Past Medical History: Alcohol use disorder Alcohol induced cirrhosis Tobacco use Remainder reviewed in history tab.   Pertinent Past Surgical History: None   Pertinent Social History: Tobacco use: Former (quit 1 month ago, only smoked for 6 months per his report) Alcohol use: Daily (see HPI) Other Substance use: Denies Lives with his Aunt Does not work due to his hernia  Pertinent Family History: Not assessed  Remainder reviewed in history tab.   Important Outpatient Medications: Lasix 40mg  daily Spironolactone 100mg  daily Rifaximin 550mg  BID Lactulose 71EZ TID Folic Acid Thiamine MVI Remainder reviewed in medication history.   Objective: BP 103/68 (BP Location: Left Arm)   Pulse 68   Temp 98.9 F (37.2 C) (Oral)   Resp 17   SpO2 99%  Exam: General: chronically ill-appearing Eyes: scleral icterus Neck: no JVD Cardiovascular: regular rate and rhythm, no m/r/g Respiratory: CTAB anteriorly, normal respiratory effort on RA Gastrointestinal: nontender to palpation, no rebound guarding or tenderness. Distended with fluid wave. Small reducible umbilical hernia MSK: moving all extremities spontaneously, no asterixis Derm: mild jaundice  Labs:  CBC BMET  Recent Labs  Lab 09/01/21 1715  WBC 3.0*  HGB 11.2*  HCT 34.1*  PLT 44*   Recent Labs  Lab  09/01/21 1715  NA 131*  K 3.0*  CL 105  CO2 19*  BUN <5*  CREATININE 0.65  GLUCOSE 102*  CALCIUM 7.9*    Pertinent additional labs AST 74, Tbili 3.1, alk phos 134.   Imaging Studies Performed:  CT ABD PELVIS (8/23) IMPRESSION: 1. Liver cirrhosis with moderate volume ascites in the abdomen and pelvis. 2. Gallstones 3. Trace left pleural effusion 4. Small umbilical hernia containing fat and fluid. 5. Thick-walled urinary bladder, correlate for cystitis.  Camelia Phenes, MD 09/02/2021, 3:46 AM PGY-1, [place] Intern pager: [phone], text pages welcome Secure chat group [place] Upper-Level Resident Addendum   I have independently interviewed and examined the patient. I have discussed the above with Dr. Edd Arbour and agree with the documented plan. My edits for correction/addition/clarification are included above. Please see any attending notes.   Alcus Dad, MD PGY-3, [facility] 09/02/2021 3:46 AM  FPTS Service pager: [phone] (text pages welcome through [facility])

## 2021-09-02 ENCOUNTER — Observation Stay (HOSPITAL_COMMUNITY): Payer: Self-pay

## 2021-09-02 ENCOUNTER — Other Ambulatory Visit: Payer: Self-pay

## 2021-09-02 ENCOUNTER — Encounter (HOSPITAL_COMMUNITY): Payer: Self-pay | Admitting: Family Medicine

## 2021-09-02 DIAGNOSIS — E871 Hypo-osmolality and hyponatremia: Secondary | ICD-10-CM

## 2021-09-02 DIAGNOSIS — F1090 Alcohol use, unspecified, uncomplicated: Secondary | ICD-10-CM

## 2021-09-02 DIAGNOSIS — K429 Umbilical hernia without obstruction or gangrene: Secondary | ICD-10-CM

## 2021-09-02 DIAGNOSIS — K529 Noninfective gastroenteritis and colitis, unspecified: Secondary | ICD-10-CM

## 2021-09-02 DIAGNOSIS — D61818 Other pancytopenia: Secondary | ICD-10-CM

## 2021-09-02 HISTORY — PX: IR PARACENTESIS: IMG2679

## 2021-09-02 LAB — BODY FLUID CELL COUNT WITH DIFFERENTIAL
Eos, Fluid: 0 %
Lymphs, Fluid: 70 %
Monocyte-Macrophage-Serous Fluid: 29 % — ABNORMAL LOW (ref 50–90)
Neutrophil Count, Fluid: 1 % (ref 0–25)
Total Nucleated Cell Count, Fluid: 95 cu mm (ref 0–1000)

## 2021-09-02 LAB — ALBUMIN, PLEURAL OR PERITONEAL FLUID: Albumin, Fluid: <1.5 g/dL

## 2021-09-02 LAB — GRAM STAIN: Gram Stain: NONE SEEN

## 2021-09-02 LAB — CBC WITH DIFFERENTIAL/PLATELET
Abs Immature Granulocytes: 0.01 10*3/uL (ref 0.00–0.07)
Basophils Absolute: 0 10*3/uL (ref 0.0–0.1)
Basophils Relative: 0 %
Eosinophils Absolute: 0.1 10*3/uL (ref 0.0–0.5)
Eosinophils Relative: 3 %
HCT: 27.1 % — ABNORMAL LOW (ref 39.0–52.0)
Hemoglobin: 9.1 g/dL — ABNORMAL LOW (ref 13.0–17.0)
Immature Granulocytes: 1 %
Lymphocytes Relative: 48 %
Lymphs Abs: 0.7 10*3/uL (ref 0.7–4.0)
MCH: 30.5 pg (ref 26.0–34.0)
MCHC: 33.6 g/dL (ref 30.0–36.0)
MCV: 90.9 fL (ref 80.0–100.0)
Monocytes Absolute: 0.2 10*3/uL (ref 0.1–1.0)
Monocytes Relative: 11 %
Neutro Abs: 0.5 10*3/uL — ABNORMAL LOW (ref 1.7–7.7)
Neutrophils Relative %: 37 %
Platelets: 31 10*3/uL — ABNORMAL LOW (ref 150–400)
RBC: 2.98 MIL/uL — ABNORMAL LOW (ref 4.22–5.81)
RDW: 15.5 % (ref 11.5–15.5)
Smear Review: NORMAL
WBC: 1.5 10*3/uL — ABNORMAL LOW (ref 4.0–10.5)
nRBC: 0 % (ref 0.0–0.2)

## 2021-09-02 LAB — COMPREHENSIVE METABOLIC PANEL
ALT: 24 U/L (ref 0–44)
AST: 48 U/L — ABNORMAL HIGH (ref 15–41)
Albumin: 1.9 g/dL — ABNORMAL LOW (ref 3.5–5.0)
Alkaline Phosphatase: 101 U/L (ref 38–126)
Anion gap: 5 (ref 5–15)
BUN: <5 mg/dL — ABNORMAL LOW (ref 6–20)
CO2: 20 mmol/L — ABNORMAL LOW (ref 22–32)
Calcium: 7.3 mg/dL — ABNORMAL LOW (ref 8.9–10.3)
Chloride: 109 mmol/L (ref 98–111)
Creatinine, Ser: 0.68 mg/dL (ref 0.61–1.24)
GFR, Estimated: >60 mL/min (ref >60)
Glucose, Bld: 89 mg/dL (ref 70–99)
Potassium: 3 mmol/L — ABNORMAL LOW (ref 3.5–5.1)
Sodium: 134 mmol/L — ABNORMAL LOW (ref 135–145)
Total Bilirubin: 2.2 mg/dL — ABNORMAL HIGH (ref 0.3–1.2)
Total Protein: 5.3 g/dL — ABNORMAL LOW (ref 6.5–8.1)

## 2021-09-02 LAB — PROTIME-INR
INR: 2.3 — ABNORMAL HIGH (ref 0.8–1.2)
Prothrombin Time: 25 seconds — ABNORMAL HIGH (ref 11.4–15.2)

## 2021-09-02 LAB — PROTEIN, PLEURAL OR PERITONEAL FLUID: Total protein, fluid: <3 g/dL

## 2021-09-02 LAB — AMMONIA: Ammonia: 83 umol/L — ABNORMAL HIGH (ref 9–35)

## 2021-09-02 LAB — LACTATE DEHYDROGENASE, PLEURAL OR PERITONEAL FLUID: LD, Fluid: 41 U/L — ABNORMAL HIGH (ref 3–23)

## 2021-09-02 LAB — APTT: aPTT: 41 seconds — ABNORMAL HIGH (ref 24–36)

## 2021-09-02 LAB — PATHOLOGIST SMEAR REVIEW

## 2021-09-02 MED ORDER — LIDOCAINE HCL 1 % IJ SOLN
INTRAMUSCULAR | Status: AC
  Administered 2021-09-02: 10 mL
  Filled 2021-09-02: qty 20

## 2021-09-02 MED ORDER — PREDNISONE 20 MG PO TABS: 40.0000 mg | ORAL_TABLET | Freq: Every day | ORAL | Status: DC

## 2021-09-02 MED ORDER — RIFAXIMIN 550 MG PO TABS
550.0000 mg | ORAL_TABLET | Freq: Two times a day (BID) | ORAL | Status: DC
Start: 2021-09-02 — End: 2021-09-02

## 2021-09-02 MED ORDER — RIFAXIMIN 550 MG PO TABS
550.0000 mg | ORAL_TABLET | Freq: Two times a day (BID) | ORAL | Status: DC
  Administered 2021-09-02 – 2021-09-03 (×3): 550 mg via ORAL
  Filled 2021-09-02 (×3): qty 1

## 2021-09-02 MED ORDER — POTASSIUM CHLORIDE CRYS ER 20 MEQ PO TBCR
40.0000 meq | EXTENDED_RELEASE_TABLET | Freq: Once | ORAL | Status: AC
  Administered 2021-09-02: 40 meq via ORAL
  Filled 2021-09-02: qty 2

## 2021-09-02 MED ORDER — SODIUM CHLORIDE 0.9 % IV SOLN
1.0000 g | Freq: Every day | INTRAVENOUS | Status: DC
  Administered 2021-09-02 – 2021-09-03 (×2): 1 g via INTRAVENOUS
  Filled 2021-09-02 (×2): qty 10

## 2021-09-02 MED ORDER — PREDNISONE 20 MG PO TABS
40.0000 mg | ORAL_TABLET | Freq: Every day | ORAL | Status: DC
  Administered 2021-09-02 – 2021-09-03 (×2): 40 mg via ORAL
  Filled 2021-09-02 (×2): qty 2

## 2021-09-02 NOTE — Assessment & Plan Note (Addendum)
CIWA scores 0, 0. Expresses desire to quit. - continue CIWA protocol, d/c Ativan today as scores still low  - thiamine, folate, MVI daily - TOC for substance use resources - Outpatient follow up in 1 week with [name]

## 2021-09-02 NOTE — Assessment & Plan Note (Addendum)
Resolved, no additional N/V/D today. - restart home lactulose as pt has not had BM in 3 days and ammonia elevated  - supportive care

## 2021-09-02 NOTE — Assessment & Plan Note (Addendum)
Present on admission. Chronic, reducible. Not deemed to require corrective intervention. On CT abd pelvis, small umbilical hernia containing fat and fluid. Patient reports chronic pain at site of hernia. - Follow up with outpatient surgery after d/c

## 2021-09-02 NOTE — Assessment & Plan Note (Deleted)
Chronic. Likely secondary to liver cirrhosis. Platelet count 44k. - held anticoagulation - continue SCDs for VTE ppx

## 2021-09-02 NOTE — Assessment & Plan Note (Addendum)
Chronic. Likely secondary to liver cirrhosis. Platelet count 31k, WBC 1.5, Hgb 9.1. No evidence of bleeding.  - continue SCDs for VTE ppx - daily CBC, Hb goal > 7.0

## 2021-09-02 NOTE — Progress Notes (Addendum)
Daily Progress Note Intern Pager: [phone]  Patient name: Mathew [name] Medical record number: [id number] Date of birth: 11/28/1980 Age: 41 y.o. Gender: male  Primary Care Provider: [name], MD Consultants: None Code Status: FULL  Pt Overview and Major Events to Date:  09/01/21 - admitted for abdominal pain   Assessment and Plan: Mathew [name] Mathew [name] is 16 year yo male with a pertinent PMH/PSH including alcohol use disorder and alcohol induced liver cirrhosis presenting with generalized abdominal pain for the past 1 month and N/V/D for the past 3 days due to decompensated alcoholic liver cirrhosis with ascites superimposed on gastroenteritis and umbilical hernia.   * Alcoholic cirrhosis of liver with ascites (HCC) Child-Pugh score 11 points  MELD score 24 on admission (19.6% 3 month mortality), down to 19 this morning  Discriminant function score 57.4 Paracentesis performed 08/30/21 at outpatient IR clinic, yielding 1.4 liters of peritoneal fluid. Has had 2 paracenteses this year 08/30/21 and 06/17/21. One in 2022 and one in 2020. Known h/o varices as well. On admission, AST 74, Tbili 3.1, alk phos 134.  - hold home lactulose due to diarrhea; may be able to restart soon - hold home propranolol for now due to soft BP - continue spironolactone 100 mg, furosemide 40 mg  - continue home rifaximin 550 mg BID - f/u paracentesis and labs - Begin prednisolone 40 mg starting tomorrow pending fluid analysis  -Continue CTX for SBP ppx   Alcohol use disorder CIWA scores 0, 0. Expresses desire to quit. - continue CIWA protocol with Ativan; consider d/c Ativan 8/25 if scores still low - consider acamprosate for alcohol cessation - thiamine, folate, MVI daily - TOC for substance use resources  Gastroenteritis Likely resolving, no additional N/V/D today. - held home lactulose; ammonia slightly elevated can consider restarting soon - supportive care  Pancytopenia  (HCC) Chronic. Likely secondary to liver cirrhosis. Platelet count 31k, WBC 1.5, Hgb 9.1. No evidence of bleeding.  - continue SCDs for VTE ppx - daily CBC, Hb goal > 7.0  Hyponatremia Na 131 in the setting of chronic alcohol use and cirrhosis. - encourage alcohol cessation - encourage proper nutrition  Umbilical hernia Present on admission. Chronic, reducible. Not deemed to require corrective intervention. On CT abd pelvis, small umbilical hernia containing fat and fluid. Patient reports chronic pain at site of hernia. - Follow up with outpatient surgery after d/c   FEN/GI: 2g sodium restricted diet with thin fluids  PPx: SCDs for VTE ppx  Dispo:Home pending clinical improvement . Barriers include alcohol use disorder and ascites from alcoholic cirrhosis.   Subjective:  Patient seen at bedside and conversation with help of Spanish interpreter. Patient just had paracentesis done and is feeling much better, his abdominal pain has gone down. He has not vomited over the past couple days and diarrhea seems improved as he cannot remember his last diarrhea. Patient discusses feeling short of breath right before he goes to bed, endorses snoring but denies apneic episodes.    Objective: Temp:  [97.8 F (36.6 C)-98.9 F (37.2 C)] 98.5 F (36.9 C) (08/24 1228) Pulse Rate:  [57-81] 63 (08/24 1228) Resp:  [14-18] 17 (08/24 1228) BP: (88-130)/(53-87) 130/79 (08/24 1228) SpO2:  [87 %-100 %] 100 % (08/24 1228) Weight:  [83.3 kg] 83.3 kg (08/24 0403)  Physical Exam: General: Alert and oriented x 4, ill-appearing, afebrile  Cardiovascular: RRR S1 S2 present. Mild systolic murmur present. JVD absent.  Respiratory: CTA bilaterally, respiratory effort  normal  Abdomen: Distended, soft, +BS in all 4 quadrants, no tenderness to palpation, no hepatosplenomegaly. Reducible umbilical hernia present.  Extremities: No swelling or edema in lower extremities  Neuro/Psych: No tremors, hallucinations,  seizures   Laboratory: Most recent CBC Lab Results  Component Value Date   WBC 1.5 (L) 09/02/2021   HGB 9.1 (L) 09/02/2021   HCT 27.1 (L) 09/02/2021   MCV 90.9 09/02/2021   PLT 31 (L) 09/02/2021   Most recent BMP    Latest Ref Rng & Units 09/02/2021    5:32 AM  BMP  Glucose 70 - 99 mg/dL 89   BUN 6 - 20 mg/dL <5   Creatinine 0.61 - 1.24 mg/dL 0.68   Sodium 135 - 145 mmol/L 134   Potassium 3.5 - 5.1 mmol/L 3.0   Chloride 98 - 111 mmol/L 109   CO2 22 - 32 mmol/L 20   Calcium 8.9 - 10.3 mg/dL 7.3     Other pertinent labs AST 74>48, alk phos 134 Albumin 1.9 Ammonia 83  Total Bili 2.2  CBC Hb 9 Platelets 31K LOW  PT 25 INR 2.3 PTT 41  Lipase 59  Imaging/Diagnostic Tests:  CT ABD PELVIS (8/23) IMPRESSION: 1. Liver cirrhosis with moderate volume ascites in the abdomen and pelvis. 2. Gallstones 3. Trace left pleural effusion 4. Small umbilical hernia containing fat and fluid. 5. Thick-walled urinary bladder, correlate for cystitis.    Lupita Raider, Medical Student 09/02/2021, 11:56 AM  [place] Intern pager: [phone], text pages welcome Secure chat group [place] Upper-Level Resident Addendum   I have independently interviewed and examined the patient. I have discussed the above with the original author and agree with their documentation. My edits for correction/addition/clarification are included where appropriate. Please see also any attending notes.   Sharion Settler, DO PGY-3, [facility] 09/02/2021 12:47 PM  [place] Service pager: [phone] (text pages welcome through [place])

## 2021-09-02 NOTE — Assessment & Plan Note (Addendum)
Na 131 in the setting of chronic alcohol use and cirrhosis. - encourage alcohol cessation - encourage proper nutrition

## 2021-09-02 NOTE — Procedures (Signed)
PROCEDURE SUMMARY:  Successful US guided paracentesis from left lateral abdomen.  Yielded 2.0 liters of yellow, clear fluid.  No immediate complications.  Pt tolerated well.   Specimen was sent for labs.  EBL < 74mL  Patient sustained a small scratch superior to the puncture site during the procedure.  Area sterile, clean. A bandaid was placed.   Hoyt Koch PA-C 09/02/2021 11:21 AM

## 2021-09-03 ENCOUNTER — Other Ambulatory Visit (HOSPITAL_COMMUNITY): Payer: Self-pay

## 2021-09-03 DIAGNOSIS — E876 Hypokalemia: Secondary | ICD-10-CM

## 2021-09-03 DIAGNOSIS — F1090 Alcohol use, unspecified, uncomplicated: Secondary | ICD-10-CM

## 2021-09-03 LAB — COMPREHENSIVE METABOLIC PANEL
ALT: 25 U/L (ref 0–44)
AST: 52 U/L — ABNORMAL HIGH (ref 15–41)
Albumin: 2 g/dL — ABNORMAL LOW (ref 3.5–5.0)
Alkaline Phosphatase: 99 U/L (ref 38–126)
Anion gap: 4 — ABNORMAL LOW (ref 5–15)
BUN: 5 mg/dL — ABNORMAL LOW (ref 6–20)
CO2: 20 mmol/L — ABNORMAL LOW (ref 22–32)
Calcium: 7.6 mg/dL — ABNORMAL LOW (ref 8.9–10.3)
Chloride: 107 mmol/L (ref 98–111)
Creatinine, Ser: 0.77 mg/dL (ref 0.61–1.24)
GFR, Estimated: >60 mL/min (ref >60)
Glucose, Bld: 195 mg/dL — ABNORMAL HIGH (ref 70–99)
Potassium: 3.7 mmol/L (ref 3.5–5.1)
Sodium: 131 mmol/L — ABNORMAL LOW (ref 135–145)
Total Bilirubin: 2.2 mg/dL — ABNORMAL HIGH (ref 0.3–1.2)
Total Protein: 5.7 g/dL — ABNORMAL LOW (ref 6.5–8.1)

## 2021-09-03 LAB — CBC
HCT: 28.5 % — ABNORMAL LOW (ref 39.0–52.0)
Hemoglobin: 9.5 g/dL — ABNORMAL LOW (ref 13.0–17.0)
MCH: 30.6 pg (ref 26.0–34.0)
MCHC: 33.3 g/dL (ref 30.0–36.0)
MCV: 91.9 fL (ref 80.0–100.0)
Platelets: 36 10*3/uL — ABNORMAL LOW (ref 150–400)
RBC: 3.1 MIL/uL — ABNORMAL LOW (ref 4.22–5.81)
RDW: 15.7 % — ABNORMAL HIGH (ref 11.5–15.5)
WBC: 1.5 10*3/uL — ABNORMAL LOW (ref 4.0–10.5)
nRBC: 0 % (ref 0.0–0.2)

## 2021-09-03 LAB — MAGNESIUM: Magnesium: 1.7 mg/dL (ref 1.7–2.4)

## 2021-09-03 LAB — CYTOLOGY - NON PAP

## 2021-09-03 MED ORDER — PROPRANOLOL HCL 20 MG PO TABS
10.0000 mg | ORAL_TABLET | Freq: Two times a day (BID) | ORAL | Status: DC
  Administered 2021-09-03: 10 mg via ORAL
  Filled 2021-09-03: qty 1

## 2021-09-03 MED ORDER — CIPROFLOXACIN HCL 500 MG PO TABS
500.0000 mg | ORAL_TABLET | Freq: Every day | ORAL | 0 refills | Status: AC
  Filled 2021-09-03: qty 30, 30d supply, fill #0

## 2021-09-03 MED ORDER — NICOTINE 14 MG/24HR TD PT24
14.0000 mg | MEDICATED_PATCH | Freq: Every day | TRANSDERMAL | 0 refills | Status: DC
  Filled 2021-09-03: qty 28, 28d supply, fill #0

## 2021-09-03 MED ORDER — PREDNISONE 20 MG PO TABS
40.0000 mg | ORAL_TABLET | Freq: Every day | ORAL | 0 refills | Status: DC
  Filled 2021-09-03: qty 12, 6d supply, fill #0

## 2021-09-03 MED ORDER — LACTULOSE 10 GM/15ML PO SOLN
30.0000 g | Freq: Three times a day (TID) | ORAL | Status: DC
  Administered 2021-09-03: 30 g via ORAL
  Filled 2021-09-03: qty 45

## 2021-09-03 NOTE — Progress Notes (Addendum)
   Daily Progress Note Intern Pager: [phone]  Patient name: Mathew [name] Medical record number: [id number] Date of birth: 05/22/1980 Age: 41 y.o. Gender: male  Primary Care Provider: Wright, Patrick E, MD Consultants: None Code Status: FULL  Pt Overview and Major Events to Date:  09/01/21 - admitted for abdominal pain  09/01/21 - paracentesis   Assessment and Plan: Mathew [name] is 41 year yo male with a pertinent PMH/PSH including alcohol use disorder and alcohol induced liver cirrhosis presenting with generalized abdominal pain for the past 1 month and N/V/D for the past 3 days due to decompensated alcoholic liver cirrhosis with ascites superimposed on gastroenteritis and umbilical hernia.    * Alcoholic cirrhosis of liver with ascites (HCC) Child-Pugh score 11 points  MELD score 24 on admission (19.6% 3 month mortality), down to 19 today (6% 3 month mortality) Discriminant function score 57.4 Paracentesis performed 08/30/21 at outpatient IR clinic, yielding 1.4 liters of peritoneal fluid. Per Dr. Schick at this clinic, he eventually requires >/=2 paracenteses in a 30 day period, candidacy for formal evaluation by the [facility] will be assessed. Has had 2 paracenteses this year 08/30/21 and 06/17/21. One in 2022 and one in 2020. Known h/o varices as well. On admission, AST 74, Tbili 3.1, alk phos 134.  - restart home lactulose as diarrhea has resolved - restart home propranolol as pressures around 120/80  - continue spironolactone 100 mg, furosemide 40 mg  - continue home rifaximin 550 mg BID - Paracentesis yielded 2.0 L of fluid. Fluid analysis: yellow and hazy, cell count 95, 1% neutros, 70% lymphocytes, 29 monocyto-macros, 0 os, no WBC or organisms seen on gram stain, no malignant cells, neutropenia and thrombocytopenia, LDH 41, albumin <1.5, total protein <3, culture shows no growth <24 hours  - Begin outpatient SBP  ppx Ciprofloxacin 500mg PO daily  - Continue prednisone 40 mg with 57.4 discriminant function score, begin 10 day course for alcoholic hepatitis  - low sodium diet - On d/c, follow up with primary care Dr. Wright for [place] score and determination of need to continue prednisone for alcoholic hepatitis   Alcohol use disorder CIWA scores 0, 0. Expresses desire to quit. Offered medication to assist with cessation, but patient declines at this time.  - continue CIWA protocol, d/c Ativan today as scores still low  - thiamine, folate, MVI daily - TOC for substance use resources - Outpatient follow up in 1 week with Dr. Wright  Gastroenteritis Resolved, no additional N/V/D today. - restart home lactulose as pt has not had BM in 3 days and ammonia elevated  - supportive care  Pancytopenia (HCC) Chronic. Likely secondary to liver cirrhosis. Platelet count 31k, WBC 1.5, Hgb 9.1. No evidence of bleeding.  - continue SCDs for VTE ppx - daily CBC, Hb goal > 7.0  Hyponatremia Na 131 in the setting of chronic alcohol use and cirrhosis. - encourage alcohol cessation - encourage proper nutrition  Umbilical hernia Present on admission. Chronic, reducible. Not deemed to require corrective intervention. On CT abd pelvis, small umbilical hernia containing fat and fluid. Patient reports chronic pain at site of hernia. - Follow up with outpatient surgery after d/c    FEN/GI: 2g sodium restricted diet with thin fluids  PPx: SCDs for VTE ppx  Dispo:Home pending clinical improvement . Barriers include alcohol use disorder and ascites from alcoholic cirrhosis.   Subjective:  Patient seen at bedside, resting comfortably. Denies chills, tremors, hallucinations.   Mentions having nightmares. Describes only mild abdominal pain of RLQ. Has not had BM for 3 days. Would like to be discharged today, and would like outpatient assistance with alcohol cessation. Will provide community resources. Has tried to quit  drinking in the past but becomes depressed and hopeless.   Objective: Temp:  [98.4 F (36.9 C)-98.5 F (36.9 C)] 98.5 F (36.9 C) (08/25 0755) Pulse Rate:  [63-78] 78 (08/25 0755) Resp:  [16-18] 16 (08/25 0755) BP: (123-130)/(72-80) 123/72 (08/25 0755) SpO2:  [100 %] 100 % (08/25 0755) Physical Exam: General: Alert and oriented x 4, afebrile  Cardiovascular: RRR S1 S2 present. Mild systolic murmur present. JVD absent.  Respiratory: CTA bilaterally, respiratory effort normal  Abdomen: Distended, soft, +BS in all 4 quadrants, no tenderness to palpation, no hepatosplenomegaly. Reducible umbilical hernia present. Fluid wave present.  Extremities: No swelling or edema in lower extremities  Neuro/Psych: No tremors, hallucinations, seizures   Laboratory: Most recent CBC Lab Results  Component Value Date   WBC 1.5 (L) 09/03/2021   HGB 9.5 (L) 09/03/2021   HCT 28.5 (L) 09/03/2021   MCV 91.9 09/03/2021   PLT 36 (L) 09/03/2021   Most recent BMP    Latest Ref Rng & Units 09/03/2021   12:57 AM  BMP  Glucose 70 - 99 mg/dL 195   BUN 6 - 20 mg/dL 5   Creatinine 0.61 - 1.24 mg/dL 0.77   Sodium 135 - 145 mmol/L 131   Potassium 3.5 - 5.1 mmol/L 3.7   Chloride 98 - 111 mmol/L 107   CO2 22 - 32 mmol/L 20   Calcium 8.9 - 10.3 mg/dL 7.6     Other pertinent labs  AST 52, alk phos 134>99 Albumin 2.0 Total Protein 5.7 Ammonia 83  Total Bili 2.2  CBC Hb 9 Platelets 31K LOW  PT 25 INR 2.3 PTT 41  Lipase 59     Imaging/Diagnostic Tests:  CT ABD PELVIS (8/23) IMPRESSION: 1. Liver cirrhosis with moderate volume ascites in the abdomen and pelvis. 2. Gallstones 3. Trace left pleural effusion 4. Small umbilical hernia containing fat and fluid. 5. Thick-walled urinary bladder, correlate for cystitis.   Lupita Raider, Medical Student 09/03/2021, 11:38 AM  [place] Intern pager: [phone], text pages welcome Secure chat group [place]     I have evaluated this patient along with Student Dr. Orlena Sheldon and reviewed the above note, making necessary revisions.  Pearla Dubonnet, MD 09/03/2021, 12:11 PM PGY-2, [place]

## 2021-09-03 NOTE — Progress Notes (Signed)
Nsg Discharge Note  Admit Date:  09/01/2021 Discharge date: 09/03/2021   Mathew [name] to be D/C'd Home per MD order.  AVS completed. Patient/caregiver able to verbalize understanding.  Discharge Medication: Allergies as of 09/03/2021   No Known Allergies      Medication List     STOP taking these medications    acetaminophen 500 MG tablet Commonly known as: TYLENOL   LORazepam 0.5 MG tablet Commonly known as: ATIVAN       TAKE these medications    ciprofloxacin 500 MG tablet Commonly known as: Cipro Tome 1 tableta (500 mg en total) por va oral diariamente con el desayuno. (Take 1 tablet (500 mg total) by mouth daily with breakfast.)   FeroSul 325 (65 FE) MG tablet Generic drug: ferrous sulfate Tome 1 tableta (325 mg en total) por va oral diariamente con el desayuno. (Take 1 tablet (325 mg total) by mouth daily with breakfast.)   folic acid 1 MG tablet Commonly known as: FOLVITE Tome 1 tableta (1 mg en total) por va oral diariamente. (Take 1 tablet (1 mg total) by mouth daily.)   furosemide 40 MG tablet Commonly known as: LASIX Tome 1 tableta (40 mg en total) por va oral diariamente. (Take 1 tablet (40 mg total) by mouth daily.)   lactulose 10 GM/15ML solution Commonly known as: CHRONULAC Take 45 mLs (30 g total) by mouth 3 (three) times daily.   multivitamin with minerals Tabs tablet Take 1 tablet by mouth daily.   nicotine 14 mg/24hr patch Commonly known as: NICODERM CQ - dosed in mg/24 hours Place 1 patch (14 mg total) onto the skin daily. Start taking on: September 04, 2021   pantoprazole 40 MG tablet Commonly known as: PROTONIX Take 1 tablet (40 mg total) by mouth daily at 6 (six) AM. What changed: when to take this   predniSONE 20 MG tablet Commonly known as: DELTASONE Take 2 tablets (40 mg total) by mouth daily with breakfast. Start taking on: September 04, 2021   propranolol 10 MG tablet Commonly known as: INDERAL Take 1 tablet (10  mg total) by mouth 2 (two) times daily.   rifaximin 550 MG Tabs tablet Commonly known as: XIFAXAN Take 1 tablet (550 mg total) by mouth 2 (two) times daily.   spironolactone 100 MG tablet Commonly known as: ALDACTONE Tome 1 tableta (100 mg en total) por va oral diariamente. (Take 1 tablet (100 mg total) by mouth daily.)   thiamine 100 MG tablet Commonly known as: VITAMIN B1 Tome 1 tableta (100 mg en total) por va oral diariamente. (Take 1 tablet (100 mg total) by mouth daily.)   Vitamin D (Ergocalciferol) 1.25 MG (50000 UNIT) Caps capsule Commonly known as: DRISDOL Take 1 capsule (50,000 Units total) by mouth every Wednesday.        Discharge Assessment: Vitals:   09/03/21 0030 09/03/21 0755  BP: 127/74 123/72  Pulse: 70 78  Resp: 18 16  Temp: 98.4 F (36.9 C) 98.5 F (36.9 C)  SpO2: 100% 100%   Skin clean, dry and intact without evidence of skin break down, no evidence of skin tears noted. IV catheter discontinued intact. Site without signs and symptoms of complications - no redness or edema noted at insertion site, patient denies c/o pain - only slight tenderness at site.  Dressing with slight pressure applied.  D/c Instructions-Education: Discharge instructions given to patient/family with verbalized understanding. D/c education completed with patient/family including follow up instructions, medication list, d/c activities limitations  if indicated, with other d/c instructions as indicated by MD - patient able to verbalize understanding, all questions fully answered. Patient instructed to return to ED, call 911, or call MD for any changes in condition.  Patient escorted via WC, and D/C home via private auto.  Kizzie Bane, RN 09/03/2021 3:14 PM

## 2021-09-03 NOTE — Discharge Summary (Addendum)
[facility] Discharge Summary  Patient name: Mathew [name] [place] Medical record number: [id number] Date of birth: Jul 14, 1980 Age: 41 y.o. Gender: male Date of Admission: 09/01/2021  Date of Discharge: 09/03/21 Admitting Physician: Camelia Phenes, MD  Primary Care Provider: [name], MD Consultants: None  Indication for Hospitalization: Alcoholic cirrhosis of liver with ascites   Brief Hospital Course:  Mathew [name] is 29 year yo male with a pertinent PMH/PSH including alcohol use disorder and alcohol induced liver cirrhosis who was admitted for generalized abdominal pain for 1 month and N/V/D for 3 days due to decompensated alcoholic liver cirrhosis with ascites superimposed on gastroenteritis and chronic, reducible umbilical hernia.   Acute on Chronic Alcoholic Hepatitis  Cirrhosis with Ascites Alcohol Use Disorder On admission, AST 74, Tbili 3.1, alk phos 134, and pancytopenia and hyponatremia in the setting of liver cirrhosis. CTAP revealed liver cirrhosis with moderate volume ascites in the abdomen and pelvis. Of note, patient had a paracentesis just a few days prior to admission. MELD score was 24 on admission. Paracentesis on 09/02/21 yielded 2.0 L of yellow and hazy fluid. Fluid analysis revealed no WBC or organisms seen on gram stain, no malignant cells, albumin <1.5, total protein <3, and no growth on cell culture. Due to a Discriminant function score of 66.6 (T bili 2.2 and PT 25, we also started him on prednisone $RemoveBefor'40mg'xuveWGEzsruG$  daily for acute on chronic alcoholic hepatitis. He was discharged on ciprofloxacin $RemoveBeforeDE'500mg'fkpHLhpUPIyvild$  daily for SBP prophylaxis.   Discharge Diagnoses/Problem List:  Principal Problem for Admission: Alcoholic cirrhosis with ascites  Other Problems addressed during stay:  Patient Active Problem List   Diagnosis Date Noted   Alcohol use disorder 09/02/2021    Priority: High   Alcoholic cirrhosis of liver with ascites ([place]) 03/13/2018     Priority: High   Gastroenteritis 09/02/2021    Priority: Medium    Pancytopenia ([place])     Priority: Medium    Umbilical hernia 94/17/4081    Priority: Medium    Hyponatremia 09/02/2021   Cirrhosis of liver ([place]) 44/81/8563   Acute metabolic encephalopathy 14/97/0263   Tobacco abuse    Decompensated hepatic cirrhosis ([place]) 06/17/2021   Vitamin D deficiency 02/03/2020   Edema due to hypoalbuminemia 12/20/2019   ETOH abuse 07/09/2016      Disposition: Home  Discharge Condition: Stable  Discharge Exam:  Vitals:   09/03/21 0030 09/03/21 0755  BP: 127/74 123/72  Pulse: 70 78  Resp: 18 16  Temp: 98.4 F (36.9 C) 98.5 F (36.9 C)  SpO2: 100% 100%   General: Alert and oriented x 4, afebrile  Cardiovascular: RRR S1 S2 present. Mild systolic murmur present. JVD absent.  Respiratory: CTA bilaterally, respiratory effort normal  Abdomen: Mildly distended but much improved from admission, soft, +BS in all 4 quadrants, no tenderness to palpation, no hepatosplenomegaly. Reducible umbilical hernia present. Fluid wave present.  Extremities: No swelling or edema in lower extremities  Neuro/Psych: No tremors, hallucinations, seizures   Issues for Follow Up:  We have started Mathew [name] ([place]) on 40 mg prednisone as we determined he would benefit due to his alcoholic hepatitis and end-stage liver disease. At his follow up appointment in 1 week, please evaluate his [place] score for efficacy of steroid to determine if he should continue with total of 28 day course, or be tapered off He has now had a paracentesis on 08/30/21 and 09/02/21, which makes him a potential candidate for formal evaluation by the  [facility]. Mathew [name] expressed desire to quit drinking alcohol and his last drink was 5 days ago. He would like outpatient resources for alcohol cessation because he has tried to quit in the past and relapsed due to depression and hopelessness. He  declined medications on discharge.  Mathew [name] would like to follow up with outpatient surgery to determine treatment plan for chronic, reducible umbilical hernia. Please help to arrange outpatient referral    Significant Procedures: Paracentesis (09/02/21)   Significant Labs and Imaging:  Recent Labs  Lab 09/01/21 1715 09/02/21 0532 09/03/21 0057  WBC 3.0* 1.5* 1.5*  HGB 11.2* 9.1* 9.5*  HCT 34.1* 27.1* 28.5*  PLT 44* 31* 36*   Recent Labs  Lab 09/01/21 1715 09/01/21 2142 09/02/21 0532 09/03/21 0057  NA 131*  --  134* 131*  K 3.0*  --  3.0* 3.7  CL 105  --  109 107  CO2 19*  --  20* 20*  GLUCOSE 102*  --  89 195*  BUN <5*  --  <5* 5*  CREATININE 0.65  --  0.68 0.77  CALCIUM 7.9*  --  7.3* 7.6*  MG  --  1.6*  --  1.7  ALKPHOS 134*  --  101 99  AST 74*  --  48* 52*  ALT 33  --  24 25  ALBUMIN 2.7*  --  1.9* 2.0*   Other pertinent labs  AST 52, alk phos 134>99 Albumin 2.0 Total Protein 5.7 Ammonia 83  Total Bili 2.2  CBC Hb 9 Platelets 31K  PT 25 INR 2.3 PTT 41  Lipase 59  Pertinent Imaging/Diagnostic Tests:  CT ABD PELVIS (8/23) IMPRESSION: 1. Liver cirrhosis with moderate volume ascites in the abdomen and pelvis. 2. Gallstones 3. Trace left pleural effusion 4. Small umbilical hernia containing fat and fluid. 5. Thick-walled urinary bladder, correlate for cystitis.    Results/Tests Pending at Time of Discharge: None   Discharge Medications:  Allergies as of 09/03/2021   No Known Allergies      Medication List     STOP taking these medications    acetaminophen 500 MG tablet Commonly known as: TYLENOL   LORazepam 0.5 MG tablet Commonly known as: ATIVAN       TAKE these medications    ciprofloxacin 500 MG tablet Commonly known as: Cipro Tome 1 tableta (500 mg en total) por va oral diariamente con el desayuno. (Take 1 tablet (500 mg total) by mouth daily with breakfast.)   FeroSul 325 (65 FE) MG tablet Generic drug: ferrous  sulfate Tome 1 tableta (325 mg en total) por va oral diariamente con el desayuno. (Take 1 tablet (325 mg total) by mouth daily with breakfast.)   folic acid 1 MG tablet Commonly known as: FOLVITE Tome 1 tableta (1 mg en total) por va oral diariamente. (Take 1 tablet (1 mg total) by mouth daily.)   furosemide 40 MG tablet Commonly known as: LASIX Tome 1 tableta (40 mg en total) por va oral diariamente. (Take 1 tablet (40 mg total) by mouth daily.)   lactulose 10 GM/15ML solution Commonly known as: CHRONULAC Take 45 mLs (30 g total) by mouth 3 (three) times daily.   multivitamin with minerals Tabs tablet Take 1 tablet by mouth daily.   nicotine 14 mg/24hr patch Commonly known as: NICODERM CQ - dosed in mg/24 hours Place 1 patch (14 mg total) onto the skin daily. Start taking on: September 04, 2021   pantoprazole 40 MG  tablet Commonly known as: PROTONIX Take 1 tablet (40 mg total) by mouth daily at 6 (six) AM. What changed: when to take this   predniSONE 20 MG tablet Commonly known as: DELTASONE Take 2 tablets (40 mg total) by mouth daily with breakfast. Start taking on: September 04, 2021   propranolol 10 MG tablet Commonly known as: INDERAL Take 1 tablet (10 mg total) by mouth 2 (two) times daily.   rifaximin 550 MG Tabs tablet Commonly known as: XIFAXAN Take 1 tablet (550 mg total) by mouth 2 (two) times daily.   spironolactone 100 MG tablet Commonly known as: ALDACTONE Tome 1 tableta (100 mg en total) por va oral diariamente. (Take 1 tablet (100 mg total) by mouth daily.)   thiamine 100 MG tablet Commonly known as: VITAMIN B1 Tome 1 tableta (100 mg en total) por va oral diariamente. (Take 1 tablet (100 mg total) by mouth daily.)   Vitamin D (Ergocalciferol) 1.25 MG (50000 UNIT) Caps capsule Commonly known as: DRISDOL Take 1 capsule (50,000 Units total) by mouth every Wednesday.       Discharge Instructions: Please refer to Patient Instructions section of  EMR for full details.  Patient was counseled important signs and symptoms that should prompt return to medical care, changes in medications, dietary instructions, activity restrictions, and follow up appointments.   Follow-Up Appointments:   Follow-up Information     [name], MD Follow up.   Specialty: Pulmonary Disease Why: Dr. Bettina Gavia office will call you--they will be seeing you next week on Thursday or Friday. If you have not heard from them, please call their office to confirm your appointment time.  La oficina del Dr. Joya Gaskins lo llamar; lo vern la prxima semana el jueves o viernes. Si no ha tenido noticias suyas, llame a su oficina para confirmar la hora de su cita. Contact information: [address] [place] [address] [phone]                Lupita Raider, Medical Student 09/03/2021, 1:12 PM [place]

## 2021-09-03 NOTE — Discharge Instructions (Signed)
Dear Mathew [name] [place],  Thank you for letting us participate in your care. You were hospitalized for swelling in your belly and diagnosed with alcoholic hepatitis. You were treated with a paracentesis, steroids and antibiotics.   POST-HOSPITAL & CARE INSTRUCTIONS We are starting you on two new medications: the first is prenisone, a steroid. You will take this for the next week. Dr. Lynelle Doctor office will let you know if they want you to continue this or stop it at your follow-up appointment next week. The second medication we are starting is called ciprofloxacin, this is an antibiotic meant to help prevent you from developing new infection in your belly. You should take this every day until your doctor tells you to stop. The very best thing you could do for your health is to stop drinking. Your doctor should be able to provide you with resources to help you in this endeavor.  You would benefit from adopting a low-sodium diet plan. See the attached file for more information on this way of eating.  Go to your follow up appointments (listed below)  1. Le comenzaremos con [employer] nuevos: el primero es prenisona, un esteroide. Tomars esto durante la prxima semana. El consultorio del [name] le informar si quieren que contine con esto o que lo suspenda en su cita de seguimiento la prxima semana. El segundo medicamento que estamos comenzando se llama ciprofloxacina, un antibitico destinado a ayudar a prevenir el desarrollo de una nueva infeccin en el abdomen. Debe tomar [employer] que su mdico le indique que lo suspenda. 2. Lo mejor que puedes hacer por tu salud es dejar de beber. Su mdico debera poder brindarle recursos para Tour manager. 3. Se beneficiara si adoptara un plan de dieta bajo en sodio. Consulta el archivo adjunto para ms informacin sobre esta forma de comer. 4. Vaya a sus citas de seguimiento (enumeradas a continuacin)  DOCTOR'S  APPOINTMENT   Future Appointments  Date Time Provider Department Center  10/04/2021 10:00 AM [name], MD [facility] None    Follow-up Information     [name], MD Follow up.   Specialty: Pulmonary Disease Why: Dr. Lynelle Doctor office will call you--they will be seeing you next week on Thursday or Friday. If you have not heard from them, please call their office to confirm your appointment time.  La oficina del [name] lo llamar; lo vern la prxima semana el jueves o viernes. Si no ha tenido noticias suyas, llame a su oficina para confirmar la hora de su cita. Contact information: [address] [place] [address] [phone]                 Take care and be well!  Family Medicine Teaching Service Inpatient Team [place]  [facility]  [address] [place], [place] [address] [phone]

## 2021-09-03 NOTE — Hospital Course (Addendum)
Mathew [name] is 2 year yo male with a pertinent PMH/PSH including alcohol use disorder and alcohol induced liver cirrhosis who was admitted for generalized abdominal pain for 1 month and N/V/D for 3 days due to decompensated alcoholic liver cirrhosis with ascites superimposed on gastroenteritis and chronic, reducible umbilical hernia.   On admission, AST 74, Tbili 3.1, alk phos 134, and pancytopenia and hyponatremia in the setting of liver cirrhosis. CTAP revealed liver cirrhosis with moderate volume ascites in the abdomen and pelvis. Patient also has a small umbilical hernia, confirmed containing fat and fluid on CT, contributing to abdominal pain. MELD score was 24 on admission, and went down to 19 the next day (6% 3 month mortality). Paracentesis on 09/02/21 yielded 2.0 L of yellow and hazy fluid. Fluid analysis revealed no WBC or organisms seen on gram stain, no malignant cells, albumin <1.5, total protein <3, and no growth on cell culture. Patient was on CIWA protocol with scores of 0 throughout admission, and expressed desire to quit alcohol. He had no episodes of vomiting or diarrhea (original gastroenteritis symptoms) throughout admission. We started him on SBP prophylaxis of ceftriaxone 1g IV for 2 days, and on the day of discharge prescribed ciprofloxacin 500 mg PO daily, due to low total protein on fluid analysis and diagnosis of end-stage liver disease (Child-Pugh score 11 points). Due to a Discriminant function score of 66.6 (T bili 2.2 and PT 25, we also started him on a 7 day course of prednisone 40 mg.

## 2021-09-03 NOTE — Progress Notes (Signed)
Spanish interpreter used for DC instructions.

## 2021-09-06 ENCOUNTER — Telehealth: Payer: Self-pay | Admitting: Licensed Clinical Social Worker

## 2021-09-06 ENCOUNTER — Telehealth: Payer: Self-pay

## 2021-09-06 LAB — CULTURE, BLOOD (ROUTINE X 2)
Culture: NO GROWTH
Culture: NO GROWTH
Special Requests: ADEQUATE

## 2021-09-06 NOTE — Telephone Encounter (Signed)
Transition Care Management Follow-up Telephone Call  Call completed with assistance of Spanish Interpreter [id number]/[employer] Date of discharge and from where: 09/03/2021, [facility] How have you been since you were released from the hospital? He stated he is feeling a little better; but he doesn't think " they took all of the liquid off."   Any questions or concerns? Yes- patient was still interested in alcohol rehab.  I provided him with the address and phone number for [facility] [address] [place], [place] [phone] or [phone]. He said he wrote the information down and repeated it back to the interpreter.   I explained to him that at the [facility] center, they detox and will help him find residential treatment. He then asked for something closer. He said he has not been drinking since returning home.  I provided him with the phone number for [facility] [phone] and explained that they do not detox there but he can call to check bed availability  Items Reviewed: Did the pt receive and understand the discharge instructions provided? Yes  Medications obtained and verified? Yes - he said he has all of his medications and is taking them as prescribed.  Other? No  Any new allergies since your discharge? No  Dietary orders reviewed? No Do you have support at home? Yes   Home Care and Equipment/Supplies: Were home health services ordered? no If so, what is the name of the agency? N/a  Has the agency set up a time to come to the patient's home? not applicable Were any new equipment or medical supplies ordered?  No What is the name of the medical supply agency? N/a Were you able to get the supplies/equipment? not applicable Do you have any questions related to the use of the equipment or supplies? No  Functional Questionnaire: (I = Independent and D = Dependent) ADLs: independent   Follow up appointments reviewed:  PCP  Hospital f/u appt confirmed? Yes  Scheduled to see Dr [name]- 09/21/2021 [facility] f/u appt confirmed?  None scheduled at this time   Are transportation arrangements needed?  He said he will need to arrange a ride.  If their condition worsens, is the pt aware to call PCP or go to the Emergency Dept.? Yes Was the patient provided with contact information for the PCP's office or ED? Yes Was to pt encouraged to call back with questions or concerns? Yes

## 2021-09-06 NOTE — Telephone Encounter (Signed)
[facility] called pt today per Dr. Florene Route request using interpreter [phone]. [facility] called twice to but could not leave a message. [facility] has shared detox and rehab information with patient before today. But wanted to called to follow up and send a reminder of the resources and steps.Steps are also below.   [facility] [address] [place], [place] [phone] or [phone] 24hrs a day. I suggest that his brother or aunt take him.  It's 24 hours and he can go anytime, walk-ins and it would be free for him without insurance. He would just need to bring his medications.

## 2021-09-06 NOTE — Telephone Encounter (Signed)
Noted  

## 2021-09-07 LAB — CULTURE, BODY FLUID W GRAM STAIN -BOTTLE: Culture: NO GROWTH

## 2021-09-08 ENCOUNTER — Other Ambulatory Visit: Payer: Self-pay

## 2021-09-08 ENCOUNTER — Telehealth: Payer: Self-pay

## 2021-09-08 NOTE — Telephone Encounter (Signed)
Mathew [name] is over $3000 and patient is uninsured.  A patient assistance application was submitted to [facility] for processing on 08/31/2021.  Multiple attempts were made on 08/31/21 to obtain missing information via phone(interpreter) and mychart message sent.  Program requires a social security number or alien id # for approval.

## 2021-09-08 NOTE — Telephone Encounter (Signed)
Left voicemail for daughter, Coralie Common.

## 2021-09-21 ENCOUNTER — Ambulatory Visit: Payer: Self-pay | Admitting: Critical Care Medicine

## 2021-10-04 ENCOUNTER — Ambulatory Visit: Payer: Self-pay | Admitting: Critical Care Medicine

## 2021-10-12 ENCOUNTER — Other Ambulatory Visit: Payer: Self-pay

## 2021-11-05 ENCOUNTER — Encounter (HOSPITAL_COMMUNITY): Payer: Self-pay | Admitting: Emergency Medicine

## 2021-11-05 ENCOUNTER — Emergency Department (HOSPITAL_COMMUNITY): Payer: Self-pay

## 2021-11-05 ENCOUNTER — Other Ambulatory Visit: Payer: Self-pay

## 2021-11-05 ENCOUNTER — Inpatient Hospital Stay (HOSPITAL_COMMUNITY)
Admission: EM | Admit: 2021-11-05 | Discharge: 2021-11-09 | DRG: 443 | Disposition: A | Discharge status: Home / Self Care | Payer: Self-pay | Attending: Internal Medicine | Admitting: Internal Medicine

## 2021-11-05 DIAGNOSIS — R4182 Altered mental status, unspecified: Principal | ICD-10-CM

## 2021-11-05 DIAGNOSIS — K7682 Hepatic encephalopathy: Principal | ICD-10-CM | POA: Diagnosis present

## 2021-11-05 DIAGNOSIS — Z79899 Other long term (current) drug therapy: Secondary | ICD-10-CM

## 2021-11-05 DIAGNOSIS — F17210 Nicotine dependence, cigarettes, uncomplicated: Secondary | ICD-10-CM | POA: Diagnosis present

## 2021-11-05 DIAGNOSIS — K7031 Alcoholic cirrhosis of liver with ascites: Secondary | ICD-10-CM | POA: Diagnosis present

## 2021-11-05 DIAGNOSIS — R079 Chest pain, unspecified: Secondary | ICD-10-CM | POA: Diagnosis present

## 2021-11-05 DIAGNOSIS — F1010 Alcohol abuse, uncomplicated: Secondary | ICD-10-CM | POA: Diagnosis present

## 2021-11-05 LAB — URINALYSIS, ROUTINE W REFLEX MICROSCOPIC
Bacteria, UA: NONE SEEN
Glucose, UA: NEGATIVE mg/dL
Ketones, ur: NEGATIVE mg/dL
Leukocytes,Ua: NEGATIVE
Nitrite: NEGATIVE
Protein, ur: NEGATIVE mg/dL
Specific Gravity, Urine: >1.046 — ABNORMAL HIGH (ref 1.005–1.030)
pH: 6 (ref 5.0–8.0)

## 2021-11-05 LAB — HEPATIC FUNCTION PANEL
ALT: 19 U/L (ref 0–44)
AST: 34 U/L (ref 15–41)
Albumin: 2.8 g/dL — ABNORMAL LOW (ref 3.5–5.0)
Alkaline Phosphatase: 141 U/L — ABNORMAL HIGH (ref 38–126)
Bilirubin, Direct: 2 mg/dL — ABNORMAL HIGH (ref 0.0–0.2)
Indirect Bilirubin: 4.1 mg/dL — ABNORMAL HIGH (ref 0.3–0.9)
Total Bilirubin: 6.1 mg/dL — ABNORMAL HIGH (ref 0.3–1.2)
Total Protein: 8.1 g/dL (ref 6.5–8.1)

## 2021-11-05 LAB — BASIC METABOLIC PANEL
Anion gap: 10 (ref 5–15)
BUN: 11 mg/dL (ref 6–20)
CO2: 20 mmol/L — ABNORMAL LOW (ref 22–32)
Calcium: 9.2 mg/dL (ref 8.9–10.3)
Chloride: 103 mmol/L (ref 98–111)
Creatinine, Ser: 0.73 mg/dL (ref 0.61–1.24)
GFR, Estimated: >60 mL/min (ref >60)
Glucose, Bld: 121 mg/dL — ABNORMAL HIGH (ref 70–99)
Potassium: 4 mmol/L (ref 3.5–5.1)
Sodium: 133 mmol/L — ABNORMAL LOW (ref 135–145)

## 2021-11-05 LAB — RAPID URINE DRUG SCREEN, HOSP PERFORMED
Amphetamines: NOT DETECTED
Barbiturates: NOT DETECTED
Benzodiazepines: NOT DETECTED
Cocaine: NOT DETECTED
Opiates: NOT DETECTED
Tetrahydrocannabinol: NOT DETECTED

## 2021-11-05 LAB — TSH: TSH: 1.802 u[IU]/mL (ref 0.350–4.500)

## 2021-11-05 LAB — CBC WITH DIFFERENTIAL/PLATELET
Abs Immature Granulocytes: 0.03 10*3/uL (ref 0.00–0.07)
Basophils Absolute: 0 10*3/uL (ref 0.0–0.1)
Basophils Relative: 1 %
Eosinophils Absolute: 0.1 10*3/uL (ref 0.0–0.5)
Eosinophils Relative: 1 %
HCT: 40.2 % (ref 39.0–52.0)
Hemoglobin: 13.5 g/dL (ref 13.0–17.0)
Immature Granulocytes: 1 %
Lymphocytes Relative: 23 %
Lymphs Abs: 1.3 10*3/uL (ref 0.7–4.0)
MCH: 33.5 pg (ref 26.0–34.0)
MCHC: 33.6 g/dL (ref 30.0–36.0)
MCV: 99.8 fL (ref 80.0–100.0)
Monocytes Absolute: 0.6 10*3/uL (ref 0.1–1.0)
Monocytes Relative: 10 %
Neutro Abs: 3.8 10*3/uL (ref 1.7–7.7)
Neutrophils Relative %: 64 %
Platelets: 70 10*3/uL — ABNORMAL LOW (ref 150–400)
RBC: 4.03 MIL/uL — ABNORMAL LOW (ref 4.22–5.81)
RDW: 15 % (ref 11.5–15.5)
WBC: 5.8 10*3/uL (ref 4.0–10.5)
nRBC: 0 % (ref 0.0–0.2)

## 2021-11-05 LAB — AMMONIA: Ammonia: 81 umol/L — ABNORMAL HIGH (ref 9–35)

## 2021-11-05 LAB — BRAIN NATRIURETIC PEPTIDE: B Natriuretic Peptide: 42.5 pg/mL (ref 0.0–100.0)

## 2021-11-05 LAB — ETHANOL: Alcohol, Ethyl (B): <10 mg/dL (ref <10)

## 2021-11-05 LAB — PROTIME-INR
INR: 1.8 — ABNORMAL HIGH (ref 0.8–1.2)
Prothrombin Time: 20.9 seconds — ABNORMAL HIGH (ref 11.4–15.2)

## 2021-11-05 MED ORDER — ACETAMINOPHEN 650 MG RE SUPP: 650.0000 mg | Freq: Four times a day (QID) | RECTAL | Status: DC | PRN

## 2021-11-05 MED ORDER — ACETAMINOPHEN 325 MG PO TABS
650.0000 mg | ORAL_TABLET | Freq: Four times a day (QID) | ORAL | Status: DC | PRN
  Administered 2021-11-07: 650 mg via ORAL
  Filled 2021-11-05: qty 2

## 2021-11-05 MED ORDER — IOHEXOL 350 MG/ML SOLN
75.0000 mL | Freq: Once | INTRAVENOUS | Status: AC | PRN
  Administered 2021-11-05: 75 mL via INTRAVENOUS

## 2021-11-05 MED ORDER — SPIRONOLACTONE 25 MG PO TABS
100.0000 mg | ORAL_TABLET | Freq: Every day | ORAL | Status: DC
  Administered 2021-11-06 – 2021-11-08 (×3): 100 mg via ORAL
  Filled 2021-11-05 (×3): qty 4

## 2021-11-05 MED ORDER — RIFAXIMIN 200 MG PO TABS
200.0000 mg | ORAL_TABLET | Freq: Once | ORAL | Status: AC
  Administered 2021-11-05: 200 mg via ORAL
  Filled 2021-11-05 (×2): qty 1

## 2021-11-05 MED ORDER — ONDANSETRON HCL 4 MG/2ML IJ SOLN: 4.0000 mg | Freq: Four times a day (QID) | INTRAMUSCULAR | Status: DC | PRN

## 2021-11-05 MED ORDER — LACTATED RINGERS IV BOLUS
1000.0000 mL | Freq: Once | INTRAVENOUS | Status: AC
  Administered 2021-11-05: 1000 mL via INTRAVENOUS

## 2021-11-05 MED ORDER — LACTULOSE 10 GM/15ML PO SOLN
30.0000 g | Freq: Four times a day (QID) | ORAL | Status: DC
  Administered 2021-11-06 – 2021-11-08 (×13): 30 g via ORAL
  Filled 2021-11-05 (×8): qty 60
  Filled 2021-11-05: qty 45
  Filled 2021-11-05 (×2): qty 60

## 2021-11-05 MED ORDER — SODIUM CHLORIDE 0.9% FLUSH
3.0000 mL | Freq: Two times a day (BID) | INTRAVENOUS | Status: DC
  Administered 2021-11-06 – 2021-11-08 (×7): 3 mL via INTRAVENOUS

## 2021-11-05 MED ORDER — ONDANSETRON HCL 4 MG PO TABS: 4.0000 mg | ORAL_TABLET | Freq: Four times a day (QID) | ORAL | Status: DC | PRN

## 2021-11-05 MED ORDER — LACTULOSE 10 GM/15ML PO SOLN: 30.0000 g | Freq: Three times a day (TID) | ORAL | Status: DC

## 2021-11-05 MED ORDER — PROPRANOLOL HCL 10 MG PO TABS
10.0000 mg | ORAL_TABLET | Freq: Two times a day (BID) | ORAL | Status: DC
  Administered 2021-11-06 – 2021-11-08 (×7): 10 mg via ORAL
  Filled 2021-11-05 (×9): qty 1

## 2021-11-05 MED ORDER — NICOTINE 14 MG/24HR TD PT24: 14.0000 mg | MEDICATED_PATCH | Freq: Every day | TRANSDERMAL | Status: DC | PRN

## 2021-11-05 MED ORDER — THIAMINE MONONITRATE 100 MG PO TABS
100.0000 mg | ORAL_TABLET | Freq: Every day | ORAL | Status: DC
  Administered 2021-11-06 – 2021-11-08 (×3): 100 mg via ORAL
  Filled 2021-11-05: qty 1

## 2021-11-05 MED ORDER — FOLIC ACID 1 MG PO TABS
1.0000 mg | ORAL_TABLET | Freq: Every day | ORAL | Status: DC
  Administered 2021-11-06 – 2021-11-08 (×3): 1 mg via ORAL
  Filled 2021-11-05 (×3): qty 1

## 2021-11-05 MED ORDER — ADULT MULTIVITAMIN W/MINERALS CH
1.0000 | ORAL_TABLET | Freq: Every day | ORAL | Status: DC
  Administered 2021-11-06 – 2021-11-08 (×3): 1 via ORAL
  Filled 2021-11-05 (×3): qty 1

## 2021-11-05 MED ORDER — PANTOPRAZOLE SODIUM 40 MG PO TBEC
40.0000 mg | DELAYED_RELEASE_TABLET | Freq: Every day | ORAL | Status: DC
  Administered 2021-11-07 – 2021-11-09 (×3): 40 mg via ORAL
  Filled 2021-11-05 (×4): qty 1

## 2021-11-05 MED ORDER — RIFAXIMIN 550 MG PO TABS
550.0000 mg | ORAL_TABLET | Freq: Two times a day (BID) | ORAL | Status: DC
  Administered 2021-11-06 – 2021-11-08 (×7): 550 mg via ORAL
  Filled 2021-11-05 (×7): qty 1

## 2021-11-05 MED ORDER — CIPROFLOXACIN HCL 500 MG PO TABS
500.0000 mg | ORAL_TABLET | Freq: Every day | ORAL | Status: DC
  Administered 2021-11-06 – 2021-11-08 (×3): 500 mg via ORAL
  Filled 2021-11-05 (×3): qty 1

## 2021-11-05 MED ORDER — LACTULOSE 10 GM/15ML PO SOLN
30.0000 g | Freq: Once | ORAL | Status: AC
  Administered 2021-11-05: 30 g via ORAL
  Filled 2021-11-05: qty 45

## 2021-11-05 NOTE — ED Notes (Signed)
Admitting provider at bedside with interpreter.

## 2021-11-05 NOTE — ED Triage Notes (Signed)
Patient with history of cirrhosis and ascities brought in by brother for evaluation of altered mental status. Brother states he was called yesterday by a hospital emergency department in [place], states he is unsure what they did but states when he called to check on the patient he had been discharged and placed outside the department in a wheelchair for an unknown number of hours. Patient is confused but in no apparent distress at this time.

## 2021-11-05 NOTE — H&P (Signed)
History and Physical    Patient: Mathew [name] [id number] DOB: Jun 13, 1980 DOA: 11/05/2021 DOS: the patient was seen and examined on 11/05/2021 PCP: [name], MD  Patient coming from: Home  Chief Complaint:  Chief Complaint  Patient presents with   Altered Mental Status   HPI: Mathew [name] is a 41 y.o. male with medical history significant for decompensated alcoholic cirrhosis who presents with altered mentation. History is limited due to patient's mental status and no family at bedside.  The patient reportedly has been feeling weak and disoriented over the last several days and had sought care in the [facility] ED prior to presentation.  He denies any recent illness and has been reported that he has been taking his lactulose consistently.  His last drink was 2-1/2 months prior to presentation.  He denies any nausea, vomiting, constipation, fever, chills, night sweats. He also reports that he has been having intermittent chest pain that is minor and not necessarily associated with exertion.  In the ED, he was given 1 L fluid, rifaximin 200 mg and a dose of lactulose.  Review of Systems: As mentioned in the history of present illness. All other systems reviewed and are negative. Past Medical History:  Diagnosis Date   Alcohol withdrawal seizure (HCC)    Cirrhosis (HCC)    ETOH abuse    Pneumonia 04/02/2020   SBP (spontaneous bacterial peritonitis) (HCC) 06/17/2021   Past Surgical History:  Procedure Laterality Date   COLONOSCOPY WITH PROPOFOL N/A 03/17/2018   Procedure: COLONOSCOPY WITH PROPOFOL;  Surgeon: Bernette Redbird, MD;  Location: [facility] ENDOSCOPY;  Service: Endoscopy;  Laterality: N/A;   ESOPHAGOGASTRODUODENOSCOPY (EGD) WITH PROPOFOL N/A 03/17/2018   Procedure: ESOPHAGOGASTRODUODENOSCOPY (EGD) WITH PROPOFOL;  Surgeon: Bernette Redbird, MD;  Location: [facility] ENDOSCOPY;  Service: Endoscopy;  Laterality: N/A;   IR PARACENTESIS  03/14/2018   IR PARACENTESIS  11/18/2020    IR PARACENTESIS  06/17/2021   IR PARACENTESIS  08/30/2021   IR PARACENTESIS  09/02/2021   Social History:  reports that he has been smoking cigarettes. He started smoking about 2 years ago. He has been smoking an average of .25 packs per day. He has never used smokeless tobacco. He reports current alcohol use of about 2.0 standard drinks of alcohol per week. He reports that he does not use drugs.  No Known Allergies  Family History  Problem Relation Age of Onset   Cancer Neg Hx    Heart disease Neg Hx     Prior to Admission medications   Medication Sig Start Date End Date Taking? Authorizing Provider  ferrous sulfate 325 (65 FE) MG tablet Take 1 tablet (325 mg total) by mouth daily with breakfast. 08/25/21  Yes [name], MD  folic acid (FOLVITE) 1 MG tablet Take 1 tablet (1 mg total) by mouth daily. 08/25/21 08/25/22 Yes [name], MD  furosemide (LASIX) 40 MG tablet Take 1 tablet (40 mg total) by mouth daily. 08/25/21  Yes [name], MD  lactulose (CHRONULAC) 10 GM/15ML solution Take 45 mLs (30 g total) by mouth 3 (three) times daily. 08/25/21  Yes [name], MD  Multiple Vitamin (MULTIVITAMIN WITH MINERALS) TABS tablet Take 1 tablet by mouth daily. 08/25/21  Yes [name], MD  pantoprazole (PROTONIX) 40 MG tablet Take 1 tablet (40 mg total) by mouth daily at 6 (six) AM. Patient taking differently: Take 40 mg by mouth daily. 08/25/21  Yes [name], MD  propranolol Bevely Palmer)  10 MG tablet Take 1 tablet (10 mg total) by mouth 2 (two) times daily. 08/25/21  Yes [name], MD  spironolactone (ALDACTONE) 100 MG tablet Take 1 tablet (100 mg total) by mouth daily. 08/25/21  Yes [name], MD  thiamine (VITAMIN B1) 100 MG tablet Take 1 tablet (100 mg total) by mouth daily. 08/25/21  Yes [name], MD  Vitamin D, Ergocalciferol, (DRISDOL) 1.25 MG (50000 UNIT) CAPS capsule Take 1 capsule (50,000 Units total) by mouth every  Wednesday. Patient taking differently: Take 50,000 Units by mouth every 7 (seven) days. 07/14/21  Yes [name], MD  nicotine (NICODERM CQ - DOSED IN MG/24 HOURS) 14 mg/24hr patch Place 1 patch (14 mg total) onto the skin daily. Patient not taking: Reported on 11/05/2021 09/04/21   Alicia Amel, MD  predniSONE (DELTASONE) 20 MG tablet Take 2 tablets (40 mg total) by mouth daily with breakfast. Patient not taking: Reported on 11/05/2021 09/04/21   Alicia Amel, MD  rifaximin (XIFAXAN) 550 MG TABS tablet Take 1 tablet (550 mg total) by mouth 2 (two) times daily. Patient not taking: Reported on 09/03/2021 08/25/21   [name], MD    Physical Exam: Vitals:   11/05/21 1900 11/05/21 1915 11/05/21 1930 11/05/21 2145  BP: 113/74 (!) 115/92  114/83  Pulse: (!) 108 (!) 107  (!) 122  Resp: 13 13  15   Temp:   98.3 F (36.8 C)   TempSrc:   Oral   SpO2: 100% 100%  100%   Physical Exam Constitutional:      Comments: Smells of urine  HENT:     Mouth/Throat:     Comments: +tongue fasciculations Eyes:     General: Scleral icterus present.  Cardiovascular:     Rate and Rhythm: Regular rhythm. Tachycardia present.     Pulses: Normal pulses.  Pulmonary:     Effort: Pulmonary effort is normal. No respiratory distress.     Breath sounds: No wheezing or rales.  Abdominal:     General: Bowel sounds are normal. There is distension.     Tenderness: There is no abdominal tenderness. There is no guarding.  Musculoskeletal:        General: No swelling or deformity.     Comments: +asterixis  Skin:    Coloration: Skin is jaundiced.  Neurological:     Mental Status: He is alert. He is disoriented.     Comments: Recalls month and location, remembers year as 2013 initially. Repeats himself and mixes up words.     Data Reviewed:  EKG reviewed demonstrating inferior Q waves CT A/P with moderate ascites  Assessment and Plan: No notes have been filed under this hospital  service. Service: [facility] is a 41 yo w/PMHx of Alcoholic cirrhosis who presents with subacute hepatic encephalopathy.  #Hepatic encephalopathy #Decompensated alcoholic cirrhosis Unclear trigger as patient has no evidence of infection, bleed, trauma.  Possible nonadherence to lactulose versus suboptimal dosing as well as inability to obtain rifaximin.  Last EGD 03/2018. - Will increase lactulose to 4 times daily scheduled - Continue rifaximin 550mg  BID  - Continue Cipro prophylaxis - Continue propranolol for varices prophylaxis - Continue PPI - Recheck ammonia as needed -  will monitor on CIWA as a precaution - Appreciate social work/case manager assistance with rifaximin coverage  #Chest pain #Abnormal ECG ECG with inferior Q waves.  Patient had echo with preserved EF but given history of chest pain, reasonable to  pursue ischemic evaluation though can likely defer to outpatient. - consider stress testing outpatient   Advance Care Planning:   Code Status: Full Code   Consults: N/A  Family Communication: None at bedside  Severity of Illness: The appropriate patient status for this patient is OBSERVATION. Observation status is judged to be reasonable and necessary in order to provide the required intensity of service to ensure the patient's safety. The patient's presenting symptoms, physical exam findings, and initial radiographic and laboratory data in the context of their medical condition is felt to place them at decreased risk for further clinical deterioration. Furthermore, it is anticipated that the patient will be medically stable for discharge from the hospital within 2 midnights of admission.   Author: Cecille Rubin, MD 11/05/2021 11:23 PM  For on call review [email or web address].

## 2021-11-05 NOTE — ED Provider Triage Note (Signed)
Emergency Medicine Provider Triage Evaluation Note  [place] , a 41 y.o. male  was evaluated in triage.  Pt complains of confusion. History given by brother via Optometrist.  Was recently discharged from hospitalization in [place]. Seems he was in alcohol rehab facility and became more fatigued. When he was discharged but was discharged to the street yesterday. His brother found him outside the hospital and brought him to this ER for continued confusion.   No etoh intake in the last two weeks.   No pain, no nausea or vomiting. No fever.   Primary issue is weakness.   Last PO intake:   Review of Systems  Positive: Confusion Negative: Fever   Physical Exam  BP (!) 134/93 (BP Location: Right Arm)   Pulse 96   Temp 98 F (36.7 C) (Oral)   Resp 17   SpO2 100%  Gen:   Awake, appears lethargic and seems confused.  Resp:  Normal effort  MSK:   Moves extremities without difficulty  Other:  Lungs clear, no focal abd TTP. Not particularly icteric.   Medical Decision Making  Medically screening exam initiated at 1:00 PM.  Appropriate orders placed.  Shail Urbas [name] [name] was informed that the remainder of the evaluation will be completed by another provider, this initial triage assessment does not replace that evaluation, and the importance of remaining in the ED until their evaluation is complete.  Labs, etoh, uds   Pati Gallo [place], [place] 11/05/21 1306

## 2021-11-05 NOTE — ED Notes (Signed)
Patient ambulated to restroom, obtain urine sample

## 2021-11-05 NOTE — ED Notes (Signed)
Ambulated patient to the BR and then back to bed. Did not want to use [facility]

## 2021-11-05 NOTE — ED Provider Notes (Signed)
[facility] EMERGENCY DEPARTMENT Provider Note   CSN: [id number] Arrival date & time: 11/05/21  1210     History  Chief Complaint  Patient presents with   Altered Mental Status    Seferino Oscar [name] is a 41 y.o. male.  Jance Siek [name] is a 41 year old male with history of alcoholic cirrhosis, alcohol use disorder, umbilical hernia, pancytopenia, gallstones who presents to the ED for altered mental status.  The patient's brother and aunt at bedside reports that he has been staying at a rehab facility since a hospitalization in August.  He has had some generalized weakness for the last 2 months surrounding all of this, but this is gotten acutely worse over the last few days.  His brother reports that he went to a different emergency department yesterday and is unsure of the work-up performed, was discharged and placed in a wheelchair and was not picked up until several hours later.  He also reports that his gait is normally slow but steady, but now he is stumbling.  The patient denies feeling confused, but they have noticed that he seems confused from his baseline per them.  He denies any chest pain, abdominal pain, but does report a little nausea and vomiting this morning.  He also reports some shortness of breath with walking.  He has no symptoms at this time.  Also denies cough, fever, headache.  Last drink was 2 months ago.  The history is provided by medical records, a relative and the patient.  Altered Mental Status      Home Medications Prior to Admission medications   Medication Sig Start Date End Date Taking? Authorizing Provider  ferrous sulfate 325 (65 FE) MG tablet Take 1 tablet (325 mg total) by mouth daily with breakfast. 08/25/21  Yes [name], MD  folic acid (FOLVITE) 1 MG tablet Take 1 tablet (1 mg total) by mouth daily. 08/25/21 08/25/22 Yes [name], MD  furosemide (LASIX) 40 MG tablet Take 1 tablet (40 mg total) by mouth daily.  08/25/21  Yes [name], MD  lactulose (CHRONULAC) 10 GM/15ML solution Take 45 mLs (30 g total) by mouth 3 (three) times daily. 08/25/21  Yes [name], MD  Multiple Vitamin (MULTIVITAMIN WITH MINERALS) TABS tablet Take 1 tablet by mouth daily. 08/25/21  Yes [name], MD  pantoprazole (PROTONIX) 40 MG tablet Take 1 tablet (40 mg total) by mouth daily at 6 (six) AM. Patient taking differently: Take 40 mg by mouth daily. 08/25/21  Yes [name], MD  propranolol (INDERAL) 10 MG tablet Take 1 tablet (10 mg total) by mouth 2 (two) times daily. 08/25/21  Yes [name], MD  spironolactone (ALDACTONE) 100 MG tablet Take 1 tablet (100 mg total) by mouth daily. 08/25/21  Yes [name], MD  thiamine (VITAMIN B1) 100 MG tablet Take 1 tablet (100 mg total) by mouth daily. 08/25/21  Yes [name], MD  Vitamin D, Ergocalciferol, (DRISDOL) 1.25 MG (50000 UNIT) CAPS capsule Take 1 capsule (50,000 Units total) by mouth every Wednesday. Patient taking differently: Take 50,000 Units by mouth every 7 (seven) days. 07/14/21  Yes [name], MD  nicotine (NICODERM CQ - DOSED IN MG/24 HOURS) 14 mg/24hr patch Place 1 patch (14 mg total) onto the skin daily. Patient not taking: Reported on 11/05/2021 09/04/21   Alicia Amel, MD  predniSONE (DELTASONE) 20 MG tablet Take 2 tablets (40 mg total)  by mouth daily with breakfast. Patient not taking: Reported on 11/05/2021 09/04/21   Alicia Amel, MD  rifaximin (XIFAXAN) 550 MG TABS tablet Take 1 tablet (550 mg total) by mouth 2 (two) times daily. Patient not taking: Reported on 09/03/2021 08/25/21   [name], MD      Allergies    Patient has no known allergies.    Review of Systems    Review of Systems See HPI.   Physical Exam Updated Vital Signs BP 114/83   Pulse (!) 122   Temp 98.3 F (36.8 C) (Oral)   Resp 15   SpO2 100%   Physical Exam Vitals reviewed.  Constitutional:      General: He is  not in acute distress.    Comments: Chronically ill-appearing, appears somewhat confused/slow to respond to questions.  HENT:     Head: Normocephalic and atraumatic.     Nose: Nose normal.     Mouth/Throat:     Pharynx: Oropharynx is clear.  Eyes:     General: Scleral icterus present.  Cardiovascular:     Rate and Rhythm: Normal rate and regular rhythm.  Pulmonary:     Effort: Pulmonary effort is normal.     Comments: Breath sounds diminished in bilateral bases.  No wheezes or crackles appreciated. Abdominal:     Palpations: Abdomen is soft.     Tenderness: There is no guarding.     Hernia: A hernia is present.     Comments: Mild diffuse abdominal tenderness to palpation.  Appears slightly more increased in right upper quadrant.  Easily reducible umbilical hernia, no pain with reduction, no overlying skin changes.  Musculoskeletal:     Cervical back: Normal range of motion.     Right lower leg: Edema present.     Left lower leg: Edema present.     Comments: Trace to 1+ bilateral lower extremity edema.  Skin:    General: Skin is warm and dry.  Neurological:     Mental Status: He is alert.     Comments: Alert and oriented to person and place, not to year (states 2013).  Cranial nerves II through XII intact.  Strength 5/5 in bilateral upper and lower extremities including grip strength, elbow flexion and extension, hip flexion and extension, and knee flexion and extension.  Sensation intact throughout.  Asterixis present.      ED Results / Procedures / Treatments   Labs (all labs ordered are listed, but only abnormal results are displayed) Labs Reviewed  HEPATIC FUNCTION PANEL - Abnormal; Notable for the following components:      Result Value   Albumin 2.8 (*)    Alkaline Phosphatase 141 (*)    Total Bilirubin 6.1 (*)    Bilirubin, Direct 2.0 (*)    Indirect Bilirubin 4.1 (*)    All other components within normal limits  BASIC METABOLIC PANEL - Abnormal; Notable for the  following components:   Sodium 133 (*)    CO2 20 (*)    Glucose, Bld 121 (*)    All other components within normal limits  CBC WITH DIFFERENTIAL/PLATELET - Abnormal; Notable for the following components:   RBC 4.03 (*)    Platelets 70 (*)    All other components within normal limits  URINALYSIS, ROUTINE W REFLEX MICROSCOPIC - Abnormal; Notable for the following components:   Color, Urine AMBER (*)    Specific Gravity, Urine >1.046 (*)    Hgb urine dipstick MODERATE (*)    Bilirubin  Urine SMALL (*)    All other components within normal limits  AMMONIA - Abnormal; Notable for the following components:   Ammonia 81 (*)    All other components within normal limits  PROTIME-INR - Abnormal; Notable for the following components:   Prothrombin Time 20.9 (*)    INR 1.8 (*)    All other components within normal limits  ETHANOL  RAPID URINE DRUG SCREEN, HOSP PERFORMED  BRAIN NATRIURETIC PEPTIDE  TSH  COMPREHENSIVE METABOLIC PANEL  CBC  PROTIME-INR    EKG EKG Interpretation  Date/Time:  Friday November 05 2021 12:52:56 EDT Ventricular Rate:  89 PR Interval:  132 QRS Duration: 90 QT Interval:  392 QTC Calculation: 476 R Axis:   26 Text Interpretation: Normal sinus rhythm Minimal voltage criteria for LVH, may be normal variant ( R in aVL ) T wave abnormality, consider inferior ischemia Abnormal ECG When compared with ECG of 02-Sep-2021 04:22, PREVIOUS ECG IS PRESENT when compared to prior, similar appearance. No STEMI Confirmed by Antony Blackbird [phone]) on 11/05/2021 3:21:43 PM  Radiology CT Head Wo Contrast  Result Date: 11/05/2021 CLINICAL DATA:  Cirrhosis, altered level of consciousness, confusion EXAM: CT HEAD WITHOUT CONTRAST TECHNIQUE: Contiguous axial images were obtained from the base of the skull through the vertex without intravenous contrast. RADIATION DOSE REDUCTION: This exam was performed according to the departmental dose-optimization program which includes automated  exposure control, adjustment of the mA and/or kV according to patient size and/or use of iterative reconstruction technique. COMPARISON:  06/30/2021 FINDINGS: Brain: No acute infarct or hemorrhage. Lateral ventricles and midline structures are unremarkable. No acute extra-axial fluid collections. No mass effect. Vascular: No hyperdense vessel or unexpected calcification. Skull: Normal. Negative for fracture or focal lesion. Sinuses/Orbits: No acute finding. Other: None. IMPRESSION: 1. No acute intracranial process. Electronically Signed   By: Randa Ngo M.D.   On: 11/05/2021 18:08   CT ABDOMEN PELVIS W CONTRAST  Result Date: 11/05/2021 CLINICAL DATA:  Acute abdominal pain. History of cirrhosis and ascites. EXAM: CT ABDOMEN AND PELVIS WITH CONTRAST TECHNIQUE: Multidetector CT imaging of the abdomen and pelvis was performed using the standard protocol following bolus administration of intravenous contrast. RADIATION DOSE REDUCTION: This exam was performed according to the departmental dose-optimization program which includes automated exposure control, adjustment of the mA and/or kV according to patient size and/or use of iterative reconstruction technique. CONTRAST:  50mL OMNIPAQUE IOHEXOL 350 MG/ML SOLN COMPARISON:  CT abdomen and pelvis 09/01/2021 FINDINGS: Lower chest: No acute abnormality. Hepatobiliary: Gallstones are present. Mild nodular liver contour persists. No focal liver lesions are seen. Gallstones are present. There is no biliary ductal dilatation. Pancreas: Unremarkable. No pancreatic ductal dilatation or surrounding inflammatory changes. Spleen: Borderline enlarged, unchanged. Adrenals/Urinary Tract: Adrenal glands are unremarkable. Kidneys are normal, without renal calculi, focal lesion, or hydronephrosis. Bladder is unremarkable. Stomach/Bowel: There is diffuse gaseous distention of the colon without dilatation. There is no focal wall thickening or inflammation. The appendix, small bowel  and stomach are within normal limits. Vascular/Lymphatic: No significant vascular findings are present. No enlarged abdominal or pelvic lymph nodes. Reproductive: Prostate is unremarkable. Other: No abdominal wall hernia or abnormality. No abdominopelvic ascites. Musculoskeletal: No acute or significant osseous findings. IMPRESSION: 1. No acute localizing process in the abdomen or pelvis. 2. Stable findings of cirrhosis and portal hypertension. 3. Cholelithiasis. Electronically Signed   By: Ronney Asters M.D.   On: 11/05/2021 18:08   DG Chest 2 View  Result Date: 11/05/2021 CLINICAL DATA:  Altered  mental status EXAM: CHEST - 2 VIEW COMPARISON:  07/19/2021 FINDINGS: Transverse diameter of heart is increased. There are no signs of pulmonary edema or focal pulmonary consolidation. There is no pleural effusion or pneumothorax. IMPRESSION: There are no focal infiltrates or signs of pulmonary edema. Electronically Signed   By: Ernie AvenaPalani  Rathinasamy M.D.   On: 11/05/2021 13:27    Procedures Procedures    Medications Ordered in ED Medications  lactulose (CHRONULAC) 10 GM/15ML solution 30 g (has no administration in time range)  sodium chloride flush (NS) 0.9 % injection 3 mL (has no administration in time range)  acetaminophen (TYLENOL) tablet 650 mg (has no administration in time range)    Or  acetaminophen (TYLENOL) suppository 650 mg (has no administration in time range)  ondansetron (ZOFRAN) tablet 4 mg (has no administration in time range)    Or  ondansetron (ZOFRAN) injection 4 mg (has no administration in time range)  iohexol (OMNIPAQUE) 350 MG/ML injection 75 mL (75 mLs Intravenous Contrast Given 11/05/21 1754)  lactated ringers bolus 1,000 mL (0 mLs Intravenous Stopped 11/05/21 2131)  lactulose (CHRONULAC) 10 GM/15ML solution 30 g (30 g Oral Given 11/05/21 2133)  rifaximin (XIFAXAN) tablet 200 mg (200 mg Oral Given 11/05/21 2133)    ED Course/ Medical Decision Making/ A&P Clinical Course  as of 11/05/21 2329  Fri Nov 05, 2021  1933 Heaptic enceph admit. [DG]    Clinical Course User Index [DG] Stephanie CoupGelmann, Evelynn Hench, MD                           Medical Decision Making Problems Addressed: Alcoholic cirrhosis of liver with ascites [facility](HCC): chronic illness or injury with exacerbation, progression, or side effects of treatment Altered mental status, unspecified altered mental status type: acute illness or injury that poses a threat to life or bodily functions Hepatic encephalopathy [facility](HCC): acute illness or injury that poses a threat to life or bodily functions  Amount and/or Complexity of Data Reviewed Independent Historian: caregiver Labs: ordered. Decision-making details documented in ED Course. Radiology: ordered and independent interpretation performed. Decision-making details documented in ED Course. ECG/medicine tests: ordered and independent interpretation performed. Decision-making details documented in ED Course.  Risk Prescription drug management. Decision regarding hospitalization.   Noah CharonJuan M [name] BoosCordoba is a 41 year old male with history of alcoholic cirrhosis, alcohol use disorder, umbilical hernia, pancytopenia, gallstones who presents to the ED for altered mental status.  Patient hemodynamically stable, alert and oriented to person and place on my exam but not year, and does appear somewhat confused and slow to respond to questions.  Neurologic exam is otherwise nonfocal, no focal weakness or sensation deficits.  No aphasia or facial droop.  Patient does have unsteady gait which is new from baseline per family at bedside.  Of note, patient is on ciprofloxacin for SBP ppx per charts, patient reports compliance.  Differential diagnosis considered: Acute intracranial pathology, infection, electrolyte derangement, thyroid derangement, volume overload, among others.  No evidence of head or facial trauma on exam or per history to suggest likelihood of acute traumatic  intracranial injury, however given unsteady gait and altered mental status from baseline will perform head CT to further evaluate for potential CVA.  Patient does have diffuse abdominal tenderness palpation on exam and history of cirrhosis, no fever here or at home and no abdominal pain other than with palpation to suggest SBP, especially in light of patient compliance with prophylactic ciprofloxacin and recent reassuring paracenteses.  Will obtain abdominal CT to further evaluate for acute intra-abdominal process.  Will obtain UA to evaluate for possible UTI.  Patient states that his last drink was 2 and half months ago as he has been staying at a rehab facility, do not suspect alcohol withdrawal or acute intoxication at this time.  Will obtain ammonia to assess for potential hyperammonemia in the setting of liver disease. Hepatic encephalopathy most likely at this time.   Workup: CBC with WBC 5.8, no leukocytosis to suggest significant infection, Hgb WNL at 13.5, platelets 70 from 3 6 two months ago.  BMP with sodium 133, consistent with priors demonstrating chronic hyponatremia in the setting of liver disease and prior significant alcohol use.  CO2 20.  Glucose 121, creatinine within normal limits at 0.73.  LFTs show AST 34, ALT 19, alkaline phosphatase elevated at 141, total bilirubin elevated at 6.1, which does appear new from priors of 2.2 two months ago.  Ammonia elevated at 81.  Ethanol negative.  BNP 43. TSH within normal limits. INR 1.8. UA with negative nitrites, negative leukocytes, no bacteria, no evidence of UTI. UDS negative.   EKG with normal sinus rhythm, 89 bpm.  There are T wave inversions present in leads III and aVF.  No STEMI.  No high-grade conduction block or arrhythmia. No significant change from prior.  Chest x-ray on my independent review shows mild cardiomegaly, no significant pulmonary edema, no pneumothorax, no focal consolidation to suggest pneumonia, no pleural effusion.    CT  head shows no acute intracranial process on my independent review or review by radiology. CTAP shows no acute process, stable cirrhosis and portal hypertension, cholelithiasis without evidence of cholecystitis.  Patient found to be hyperammonemic to 81.  Suspect presentation secondary to hepatic encephalopathy.  Patient ordered for lactulose, rifaximin in the ED.  He was also given 1 L IV fluids.  Patient admitted to hospitalist service for further management.        Final Clinical Impression(s) / ED Diagnoses Final diagnoses:  Altered mental status, unspecified altered mental status type  Alcoholic cirrhosis of liver with ascites [facility])    Rx / DC Orders ED Discharge Orders     None         Stephanie Coup, MD 11/05/21 2353    Tegeler, Canary Brim, MD 11/08/21 1640

## 2021-11-06 ENCOUNTER — Observation Stay (HOSPITAL_COMMUNITY): Payer: Self-pay

## 2021-11-06 ENCOUNTER — Inpatient Hospital Stay (HOSPITAL_COMMUNITY): Payer: Self-pay

## 2021-11-06 DIAGNOSIS — R079 Chest pain, unspecified: Secondary | ICD-10-CM

## 2021-11-06 LAB — COMPREHENSIVE METABOLIC PANEL
ALT: 16 U/L (ref 0–44)
AST: 28 U/L (ref 15–41)
Albumin: 2.2 g/dL — ABNORMAL LOW (ref 3.5–5.0)
Alkaline Phosphatase: 113 U/L (ref 38–126)
Anion gap: 7 (ref 5–15)
BUN: 9 mg/dL (ref 6–20)
CO2: 24 mmol/L (ref 22–32)
Calcium: 8.7 mg/dL — ABNORMAL LOW (ref 8.9–10.3)
Chloride: 106 mmol/L (ref 98–111)
Creatinine, Ser: 0.64 mg/dL (ref 0.61–1.24)
GFR, Estimated: >60 mL/min (ref >60)
Glucose, Bld: 114 mg/dL — ABNORMAL HIGH (ref 70–99)
Potassium: 3.6 mmol/L (ref 3.5–5.1)
Sodium: 137 mmol/L (ref 135–145)
Total Bilirubin: 4.4 mg/dL — ABNORMAL HIGH (ref 0.3–1.2)
Total Protein: 5.9 g/dL — ABNORMAL LOW (ref 6.5–8.1)

## 2021-11-06 LAB — ECHOCARDIOGRAM COMPLETE
AR max vel: 1.72 cm2
AV Area VTI: 2.11 cm2
AV Area mean vel: 2 cm2
AV Mean grad: 7 mmHg
AV Peak grad: 14.4 mmHg
Ao pk vel: 1.9 m/s
Area-P 1/2: 2.66 cm2
S' Lateral: 2.5 cm

## 2021-11-06 LAB — CBC
HCT: 29 % — ABNORMAL LOW (ref 39.0–52.0)
Hemoglobin: 10.4 g/dL — ABNORMAL LOW (ref 13.0–17.0)
MCH: 34.1 pg — ABNORMAL HIGH (ref 26.0–34.0)
MCHC: 35.9 g/dL (ref 30.0–36.0)
MCV: 95.1 fL (ref 80.0–100.0)
Platelets: 52 10*3/uL — ABNORMAL LOW (ref 150–400)
RBC: 3.05 MIL/uL — ABNORMAL LOW (ref 4.22–5.81)
RDW: 14.7 % (ref 11.5–15.5)
WBC: 3.7 10*3/uL — ABNORMAL LOW (ref 4.0–10.5)
nRBC: 0 % (ref 0.0–0.2)

## 2021-11-06 LAB — BRAIN NATRIURETIC PEPTIDE: B Natriuretic Peptide: 37.4 pg/mL (ref 0.0–100.0)

## 2021-11-06 LAB — PROTIME-INR
INR: 1.9 — ABNORMAL HIGH (ref 0.8–1.2)
Prothrombin Time: 21.4 seconds — ABNORMAL HIGH (ref 11.4–15.2)

## 2021-11-06 LAB — MAGNESIUM: Magnesium: 1.7 mg/dL (ref 1.7–2.4)

## 2021-11-06 LAB — C-REACTIVE PROTEIN: CRP: 0.5 mg/dL (ref <1.0)

## 2021-11-06 LAB — AMMONIA: Ammonia: 49 umol/L — ABNORMAL HIGH (ref 9–35)

## 2021-11-06 MED ORDER — TRAZODONE HCL 50 MG PO TABS
50.0000 mg | ORAL_TABLET | Freq: Every evening | ORAL | Status: DC | PRN
  Administered 2021-11-06 – 2021-11-08 (×4): 50 mg via ORAL
  Filled 2021-11-06 (×4): qty 1

## 2021-11-06 MED ORDER — PREDNISONE 20 MG PO TABS
40.0000 mg | ORAL_TABLET | Freq: Every day | ORAL | Status: DC
  Administered 2021-11-06 – 2021-11-07 (×2): 40 mg via ORAL
  Filled 2021-11-06 (×2): qty 2

## 2021-11-06 MED ORDER — VITAMIN K1 10 MG/ML IJ SOLN
2.0000 mg | Freq: Once | INTRAVENOUS | Status: AC
  Administered 2021-11-06: 2 mg via INTRAVENOUS
  Filled 2021-11-06: qty 0.2

## 2021-11-06 MED ORDER — MAGNESIUM SULFATE 2 GM/50ML IV SOLN
2.0000 g | Freq: Once | INTRAVENOUS | Status: AC
  Administered 2021-11-06: 2 g via INTRAVENOUS
  Filled 2021-11-06: qty 50

## 2021-11-06 MED ORDER — ALBUMIN HUMAN 25 % IV SOLN: 50.0000 g | Freq: Once | INTRAVENOUS | Status: DC | PRN

## 2021-11-06 NOTE — Progress Notes (Signed)
*  PRELIMINARY RESULTS* Echocardiogram 2D Echocardiogram has been performed.  Mathew [name] 11/06/2021, 3:47 PM

## 2021-11-06 NOTE — Plan of Care (Signed)

## 2021-11-06 NOTE — Progress Notes (Signed)
PROGRESS NOTE                                                                                                                                                                                                             Patient Demographics:    Mathew [name], is a 41 y.o. male, DOB - 1980/09/29, NWG:[id number]  Outpatient Primary MD for the patient is [name], MD    LOS - 0  Admit date - 11/05/2021    Chief Complaint  Patient presents with   Altered Mental Status       Brief Narrative (HPI from H&P)   41 y.o. male with medical history significant for decompensated alcoholic cirrhosis who presents with altered mentation. History was limited due to patient's mental status and no family at bedside.   The patient reportedly has been feeling weak and disoriented over the last several days and had sought care in the [facility] ED prior to presentation.  He denies any recent illness and has been reported that he has been taking his lactulose consistently.  His last drink was 2-1/2 months prior to presentation.  He denies any nausea, vomiting, constipation, fever, chills, night sweats.  He was diagnosed with hepatic encephalopathy and admitted to the hospital.   Subjective:    [facility] today has, No headache, No chest pain, No abdominal pain - No Nausea, No new weakness tingling or numbness, no SOB.   Assessment  & Plan :    Hepatic encephalopathy in a patient with alcoholic cirrhosis. He has been placed on lactulose along with Xifaxan, ammonia is coming down, mentation gradually improving, last drink over 1 month ago, CT head unremarkable and no headache.  Discriminant factor 42 hence we will start him on prednisolone.  Will order ultrasound-guided paracentesis if enough fluid is seen on the ultrasound with albumin administration.  Appropriate studies ordered.  On exam does not seem to have significant ascites.   Continue beta-blocker for portal hypertension, continue Cipro for SBP prophylaxis which was started upon admission.   Nonspecific intermittent chest pain prior to admission.  Now resolved.  EKG with some nonspecific changes thickly flipped T waves in inferior leads, chest pain-free now we will check echocardiogram to evaluate LV function and wall motion.  Continue beta-blocker.  Candidate for aspirin due to auto anticoagulation and high  propensity to bleed.       Condition - Guarded  Family Communication  :  None  Code Status :  Full  Consults  :    PUD Prophylaxis : PPI   Procedures  :     CT Abd Pelvis - 1. No acute localizing process in the abdomen or pelvis. 2. Stable findings of cirrhosis and portal hypertension. 3. Cholelithiasis  CT head.  Nonacute.      Disposition Plan  :    Status is: Observation   DVT Prophylaxis  :    Place and maintain sequential compression device Start: 11/06/21 0541 SCDs Start: 11/05/21 2229    Lab Results  Component Value Date   PLT 52 (L) 11/06/2021    Diet :  Diet Order             DIET SOFT Room service appropriate? Yes; Fluid consistency: Thin  Diet effective now                    Inpatient Medications  Scheduled Meds:  ciprofloxacin  500 mg Oral Q breakfast   folic acid  1 mg Oral Daily   lactulose  30 g Oral QID   multivitamin with minerals  1 tablet Oral Daily   pantoprazole  40 mg Oral Q0600   propranolol  10 mg Oral BID   rifaximin  550 mg Oral BID   sodium chloride flush  3 mL Intravenous Q12H   spironolactone  100 mg Oral Daily   thiamine  100 mg Oral Daily   Continuous Infusions:  magnesium sulfate bolus IVPB     PRN Meds:.acetaminophen **OR** acetaminophen, nicotine, ondansetron **OR** ondansetron (ZOFRAN) IV, traZODone  Antibiotics  :    Anti-infectives (From admission, onward)    Start     Dose/Rate Route Frequency Ordered Stop   11/06/21 0800  ciprofloxacin (CIPRO) tablet 500 mg         500 mg Oral Daily with breakfast 11/05/21 2349     11/05/21 2345  rifaximin (XIFAXAN) tablet 550 mg        550 mg Oral 2 times daily 11/05/21 2342     11/05/21 2100  rifaximin (XIFAXAN) tablet 200 mg        200 mg Oral Once 11/05/21 2014 11/05/21 2133         Objective:   Vitals:   11/06/21 0315 11/06/21 0330 11/06/21 0423 11/06/21 0800  BP: 122/76 112/62 139/76 113/72  Pulse: 90 82 80 91  Resp: 16 16 16 16   Temp:   98 F (36.7 C)   TempSrc:   Oral   SpO2: 100% 100% 100% 100%    Wt Readings from Last 3 Encounters:  09/02/21 83.3 kg  08/25/21 82.3 kg  07/08/21 72.1 kg     Intake/Output Summary (Last 24 hours) at 11/06/2021 11/08/2021 Last data filed at 11/05/2021 2131 Gross per 24 hour  Intake 1000 ml  Output --  Net 1000 ml     Physical Exam  Awake, pleasantly confused follows, but basic commands, no focal deficits [place].AT,PERRAL Supple Neck, No JVD,   Symmetrical Chest wall movement, Good air movement bilaterally, CTAB RRR,No Gallops,Rubs or new Murmurs,  +ve B.Sounds, Abd Soft no distension, No tenderness,   No Cyanosis, Clubbing or edema       Data Review:    CBC Recent Labs  Lab 11/05/21 1314 11/06/21 0603  WBC 5.8 3.7*  HGB 13.5 10.4*  HCT 40.2 29.0*  PLT 70*  52*  MCV 99.8 95.1  MCH 33.5 34.1*  MCHC 33.6 35.9  RDW 15.0 14.7  LYMPHSABS 1.3  --   MONOABS 0.6  --   EOSABS 0.1  --   BASOSABS 0.0  --     Electrolytes Recent Labs  Lab 11/05/21 1314 11/05/21 1921 11/05/21 1924 11/06/21 0603  NA 133*  --   --  137  K 4.0  --   --  3.6  CL 103  --   --  106  CO2 20*  --   --  24  GLUCOSE 121*  --   --  114*  BUN 11  --   --  9  CREATININE 0.73  --   --  0.64  CALCIUM 9.2  --   --  8.7*  AST 34  --   --  28  ALT 19  --   --  16  ALKPHOS 141*  --   --  113  BILITOT 6.1*  --   --  4.4*  ALBUMIN 2.8*  --   --  2.2*  MG  --   --   --  1.7  CRP  --   --   --  0.5  INR  --  1.8*  --  1.9*  TSH 1.802  --   --   --   AMMONIA  --   --  81* 49*   BNP 42.5  --   --  37.4    ------------------------------------------------------------------------------------------------------------------ No results for input(s): "CHOL", "HDL", "LDLCALC", "TRIG", "CHOLHDL", "LDLDIRECT" in the last 72 hours.  Lab Results  Component Value Date   HGBA1C 4.7 (L) 10/14/2019    Recent Labs    11/05/21 1314  TSH 1.802    Radiology Reports CT Head Wo Contrast  Result Date: 11/05/2021 CLINICAL DATA:  Cirrhosis, altered level of consciousness, confusion EXAM: CT HEAD WITHOUT CONTRAST TECHNIQUE: Contiguous axial images were obtained from the base of the skull through the vertex without intravenous contrast. RADIATION DOSE REDUCTION: This exam was performed according to the departmental dose-optimization program which includes automated exposure control, adjustment of the mA and/or kV according to patient size and/or use of iterative reconstruction technique. COMPARISON:  06/30/2021 FINDINGS: Brain: No acute infarct or hemorrhage. Lateral ventricles and midline structures are unremarkable. No acute extra-axial fluid collections. No mass effect. Vascular: No hyperdense vessel or unexpected calcification. Skull: Normal. Negative for fracture or focal lesion. Sinuses/Orbits: No acute finding. Other: None. IMPRESSION: 1. No acute intracranial process. Electronically Signed   By: Randa Ngo M.D.   On: 11/05/2021 18:08   CT ABDOMEN PELVIS W CONTRAST  Result Date: 11/05/2021 CLINICAL DATA:  Acute abdominal pain. History of cirrhosis and ascites. EXAM: CT ABDOMEN AND PELVIS WITH CONTRAST TECHNIQUE: Multidetector CT imaging of the abdomen and pelvis was performed using the standard protocol following bolus administration of intravenous contrast. RADIATION DOSE REDUCTION: This exam was performed according to the departmental dose-optimization program which includes automated exposure control, adjustment of the mA and/or kV according to patient size and/or use of  iterative reconstruction technique. CONTRAST:  73mL OMNIPAQUE IOHEXOL 350 MG/ML SOLN COMPARISON:  CT abdomen and pelvis 09/01/2021 FINDINGS: Lower chest: No acute abnormality. Hepatobiliary: Gallstones are present. Mild nodular liver contour persists. No focal liver lesions are seen. Gallstones are present. There is no biliary ductal dilatation. Pancreas: Unremarkable. No pancreatic ductal dilatation or surrounding inflammatory changes. Spleen: Borderline enlarged, unchanged. Adrenals/Urinary Tract: Adrenal glands are unremarkable. Kidneys are normal, without renal calculi,  focal lesion, or hydronephrosis. Bladder is unremarkable. Stomach/Bowel: There is diffuse gaseous distention of the colon without dilatation. There is no focal wall thickening or inflammation. The appendix, small bowel and stomach are within normal limits. Vascular/Lymphatic: No significant vascular findings are present. No enlarged abdominal or pelvic lymph nodes. Reproductive: Prostate is unremarkable. Other: No abdominal wall hernia or abnormality. No abdominopelvic ascites. Musculoskeletal: No acute or significant osseous findings. IMPRESSION: 1. No acute localizing process in the abdomen or pelvis. 2. Stable findings of cirrhosis and portal hypertension. 3. Cholelithiasis. Electronically Signed   By: Darliss Cheney M.D.   On: 11/05/2021 18:08   DG Chest 2 View  Result Date: 11/05/2021 CLINICAL DATA:  Altered mental status EXAM: CHEST - 2 VIEW COMPARISON:  07/19/2021 FINDINGS: Transverse diameter of heart is increased. There are no signs of pulmonary edema or focal pulmonary consolidation. There is no pleural effusion or pneumothorax. IMPRESSION: There are no focal infiltrates or signs of pulmonary edema. Electronically Signed   By: Ernie Avena M.D.   On: 11/05/2021 13:27      Signature  Susa Raring M.D on 11/06/2021 at 8:12 AM   -  To page go to [email or web address]

## 2021-11-06 NOTE — Evaluation (Signed)
Physical Therapy Evaluation & Discharge Patient Details Name: Mathew [name] MRN: [id number] DOB: June 29, 1980 Today's Date: 11/06/2021  History of Present Illness  41 y/o male presented to ED on 11/05/21 for AMS. Admitted for hepatic encephalopathy and decompensated alcoholic cirrhosis. PMH: alcohol cirrhosis with recurrent ascites, ETOH abuse  Clinical Impression  Patient admitted with the above. PTA, patient states he lives with aunt and was independent. Recently discharged from rehab center for alcohol. Patient functioning at baseline for mobility. Only required supervision to push IV pole due to unfamiliar AD. Cognition continues to seem impaired but difficult to assess due to use of interpreter. No further skilled PT needs identified acutely. No PT follow up recommended at this time. Defer further mobility to mobility specialists and nursing staff.        Recommendations for follow up therapy are one component of a multi-disciplinary discharge planning process, led by the attending physician.  Recommendations may be updated based on patient status, additional functional criteria and insurance authorization.  Follow Up Recommendations No PT follow up      Assistance Recommended at Discharge Intermittent Supervision/Assistance  Patient can return home with the following       Equipment Recommendations None recommended by PT  Recommendations for Other Services       Functional Status Assessment Patient has had a recent decline in their functional status and demonstrates the ability to make significant improvements in function in a reasonable and predictable amount of time.     Precautions / Restrictions Precautions Precautions: Fall Restrictions Weight Bearing Restrictions: No      Mobility  Bed Mobility Overal bed mobility: Modified Independent                  Transfers Overall transfer level: Modified independent Equipment used: None                     Ambulation/Gait Ambulation/Gait assistance: Supervision Gait Distance (Feet): 250 Feet Assistive device: IV Pole Gait Pattern/deviations: Drifts right/left Gait velocity: decrease     General Gait Details: supervision for safety due to pushing IV pole for first time. No assistance required  Stairs            Wheelchair Mobility    Modified Rankin (Stroke Patients Only)       Balance                                             Pertinent Vitals/Pain Pain Assessment Pain Assessment: No/denies pain    Home Living Family/patient expects to be discharged to:: Private residence Living Arrangements: Other (Comment) (aunt) Available Help at Discharge: Family Type of Home: House Home Access: Stairs to enter Entrance Stairs-Rails: Psychiatric nurse of Steps: 5   Home Layout: One level Home Equipment: None      Prior Function Prior Level of Function : Independent/Modified Independent             Mobility Comments: Not working       Journalist, newspaper        Extremity/Trunk Assessment   Upper Extremity Assessment Upper Extremity Assessment: Overall WFL for tasks assessed    Lower Extremity Assessment Lower Extremity Assessment: Generalized weakness    Cervical / Trunk Assessment Cervical / Trunk Assessment: Normal  Communication   Communication: Prefers language other than English;Interpreter utilized  [employer] Arousal/Alertness: Counselling psychologist  During Therapy: WFL for tasks assessed/performed Overall Cognitive Status: No family/caregiver present to determine baseline cognitive functioning                                 General Comments: seems generally confused and difficulty getting thoughts in order        General Comments      Exercises     Assessment/Plan    PT Assessment Patient does not need any further PT services  PT Problem List         PT Treatment Interventions       PT Goals (Current goals can be found in the Care Plan section)  Acute Rehab PT Goals Patient Stated Goal: to go home PT Goal Formulation: All assessment and education complete, DC therapy    Frequency       Co-evaluation               AM-PAC PT "6 Clicks" Mobility  Outcome Measure Help needed turning from your back to your side while in a flat bed without using bedrails?: None Help needed moving from lying on your back to sitting on the side of a flat bed without using bedrails?: None Help needed moving to and from a bed to a chair (including a wheelchair)?: None Help needed standing up from a chair using your arms (e.g., wheelchair or bedside chair)?: None Help needed to walk in hospital room?: A Little Help needed climbing 3-5 steps with a railing? : A Little 6 Click Score: 22    End of Session Equipment Utilized During Treatment: Gait belt Activity Tolerance: Patient tolerated treatment well Patient left: in bed;with call bell/phone within reach;with bed alarm set Nurse Communication: Mobility status PT Visit Diagnosis: Muscle weakness (generalized) (M62.81)    Time: 8182-9937 PT Time Calculation (min) (ACUTE ONLY): 31 min   Charges:   PT Evaluation $PT Eval Low Complexity: 1 Low PT Treatments $Therapeutic Activity: 8-22 mins        Tanae Petrosky A. Dan Humphreys PT, DPT Acute Rehabilitation Services Office [phone]   Viviann Spare 11/06/2021, 2:16 PM

## 2021-11-07 ENCOUNTER — Inpatient Hospital Stay (HOSPITAL_COMMUNITY): Payer: Self-pay

## 2021-11-07 LAB — CBC WITH DIFFERENTIAL/PLATELET
Abs Immature Granulocytes: 0.04 10*3/uL (ref 0.00–0.07)
Basophils Absolute: 0 10*3/uL (ref 0.0–0.1)
Basophils Relative: 0 %
Eosinophils Absolute: 0.1 10*3/uL (ref 0.0–0.5)
Eosinophils Relative: 1 %
HCT: 27.3 % — ABNORMAL LOW (ref 39.0–52.0)
Hemoglobin: 9.8 g/dL — ABNORMAL LOW (ref 13.0–17.0)
Immature Granulocytes: 1 %
Lymphocytes Relative: 19 %
Lymphs Abs: 1.3 10*3/uL (ref 0.7–4.0)
MCH: 34.3 pg — ABNORMAL HIGH (ref 26.0–34.0)
MCHC: 35.9 g/dL (ref 30.0–36.0)
MCV: 95.5 fL (ref 80.0–100.0)
Monocytes Absolute: 0.9 10*3/uL (ref 0.1–1.0)
Monocytes Relative: 13 %
Neutro Abs: 4.7 10*3/uL (ref 1.7–7.7)
Neutrophils Relative %: 66 %
Platelets: 59 10*3/uL — ABNORMAL LOW (ref 150–400)
RBC: 2.86 MIL/uL — ABNORMAL LOW (ref 4.22–5.81)
RDW: 14.3 % (ref 11.5–15.5)
WBC: 7 10*3/uL (ref 4.0–10.5)
nRBC: 0 % (ref 0.0–0.2)

## 2021-11-07 LAB — COMPREHENSIVE METABOLIC PANEL WITH GFR
ALT: 15 U/L (ref 0–44)
AST: 28 U/L (ref 15–41)
Albumin: 2.1 g/dL — ABNORMAL LOW (ref 3.5–5.0)
Alkaline Phosphatase: 127 U/L — ABNORMAL HIGH (ref 38–126)
Anion gap: 7 (ref 5–15)
BUN: 8 mg/dL (ref 6–20)
CO2: 21 mmol/L — ABNORMAL LOW (ref 22–32)
Calcium: 8.4 mg/dL — ABNORMAL LOW (ref 8.9–10.3)
Chloride: 105 mmol/L (ref 98–111)
Creatinine, Ser: 0.68 mg/dL (ref 0.61–1.24)
GFR, Estimated: >60 mL/min
Glucose, Bld: 144 mg/dL — ABNORMAL HIGH (ref 70–99)
Potassium: 3.2 mmol/L — ABNORMAL LOW (ref 3.5–5.1)
Sodium: 133 mmol/L — ABNORMAL LOW (ref 135–145)
Total Bilirubin: 3.1 mg/dL — ABNORMAL HIGH (ref 0.3–1.2)
Total Protein: 5.6 g/dL — ABNORMAL LOW (ref 6.5–8.1)

## 2021-11-07 LAB — C-REACTIVE PROTEIN: CRP: 0.6 mg/dL (ref <1.0)

## 2021-11-07 LAB — PROTIME-INR
INR: 1.9 — ABNORMAL HIGH (ref 0.8–1.2)
Prothrombin Time: 21.4 seconds — ABNORMAL HIGH (ref 11.4–15.2)

## 2021-11-07 LAB — MAGNESIUM: Magnesium: 1.8 mg/dL (ref 1.7–2.4)

## 2021-11-07 LAB — BRAIN NATRIURETIC PEPTIDE: B Natriuretic Peptide: 65.6 pg/mL (ref 0.0–100.0)

## 2021-11-07 LAB — AMMONIA: Ammonia: 61 umol/L — ABNORMAL HIGH (ref 9–35)

## 2021-11-07 MED ORDER — MAGNESIUM SULFATE 2 GM/50ML IV SOLN
2.0000 g | Freq: Once | INTRAVENOUS | Status: AC
  Administered 2021-11-07: 2 g via INTRAVENOUS
  Filled 2021-11-07: qty 50

## 2021-11-07 MED ORDER — POTASSIUM CHLORIDE CRYS ER 20 MEQ PO TBCR
40.0000 meq | EXTENDED_RELEASE_TABLET | Freq: Once | ORAL | Status: AC
  Administered 2021-11-07: 40 meq via ORAL
  Filled 2021-11-07: qty 2

## 2021-11-07 MED ORDER — POTASSIUM CHLORIDE CRYS ER 20 MEQ PO TBCR
20.0000 meq | EXTENDED_RELEASE_TABLET | Freq: Once | ORAL | Status: AC
  Administered 2021-11-07: 20 meq via ORAL
  Filled 2021-11-07: qty 1

## 2021-11-07 NOTE — Plan of Care (Signed)

## 2021-11-07 NOTE — Plan of Care (Signed)
  Problem: Education: Goal: Knowledge of General Education information will improve Description Including pain rating scale, medication(s)/side effects and non-pharmacologic comfort measures Outcome: Progressing   

## 2021-11-07 NOTE — Progress Notes (Signed)
PROGRESS NOTE                                                                                                                                                                                                             Patient Demographics:    Mathew [name], is a 41 y.o. male, DOB - 1980-04-17, DZH:[id number]  Outpatient Primary MD for the patient is [name], MD    LOS - 1  Admit date - 11/05/2021    Chief Complaint  Patient presents with   Altered Mental Status       Brief Narrative (HPI from H&P)   41 y.o. male with medical history significant for decompensated alcoholic cirrhosis who presents with altered mentation. History was limited due to patient's mental status and no family at bedside.   The patient reportedly has been feeling weak and disoriented over the last several days and had sought care in the [facility] ED prior to presentation.  He denies any recent illness and has been reported that he has been taking his lactulose consistently.  His last drink was 2-1/2 months prior to presentation.  He denies any nausea, vomiting, constipation, fever, chills, night sweats.  He was diagnosed with hepatic encephalopathy and admitted to the hospital.   Subjective:    [facility] today has, No headache, No chest pain, No abdominal pain - No Nausea, No new weakness tingling or numbness, no SOB.   Assessment  & Plan :    Hepatic encephalopathy in a patient with alcoholic cirrhosis. He has been placed on lactulose along with Xifaxan, ammonia is coming down, mentation gradually improving, last drink over 1 month ago, CT head unremarkable and no headache.  Discriminant factor 42 hence we will start him on prednisolone.  Will order ultrasound-guided paracentesis if enough fluid is seen on the ultrasound with albumin administration.  Appropriate studies ordered.  On exam does not seem to have significant ascites.   Continue beta-blocker for portal hypertension, continue Cipro for SBP prophylaxis which was started upon admission.   Nonspecific intermittent chest pain prior to admission.  Now resolved.  EKG with some nonspecific changes thickly flipped T waves in inferior leads, chest pain-free now, stable echocardiogram with preserved EF and no wall motion abnormality. Continue beta-blocker.  Candidate for aspirin due to auto anticoagulation and high propensity to  bleed.       Condition - Guarded  Family Communication  :  None  Code Status :  Full  Consults  :    PUD Prophylaxis : PPI   Procedures  :     Echocardiogram.   1. Left ventricular ejection fraction, by estimation, is 65 to 70%. The left ventricle has normal function. The left ventricle has no regional wall motion abnormalities. There is mild concentric left ventricular hypertrophy. Left ventricular diastolic parameters are consistent with Grade I diastolic dysfunction (impaired relaxation).  2. Right ventricular systolic function is hyperdynamic. The right ventricular size is normal. There is normal pulmonary artery systolic pressure.  3. Left atrial size was mildly dilated.  4. The mitral valve is normal in structure. No evidence of mitral valve regurgitation. No evidence of mitral stenosis.  5. The aortic valve is tricuspid. Aortic valve regurgitation is not visualized. No aortic stenosis is present.  6. The inferior vena cava is normal in size with greater than 50% respiratory variability, suggesting right atrial pressure of 3 mmHg  CT Abd Pelvis - 1. No acute localizing process in the abdomen or pelvis. 2. Stable findings of cirrhosis and portal hypertension. 3. Cholelithiasis  CT head.  Nonacute.      Disposition Plan  :    Status is: Observation   DVT Prophylaxis  :    Place and maintain sequential compression device Start: 11/06/21 0541 SCDs Start: 11/05/21 2229    Lab Results  Component Value Date   PLT 59 (L)  11/07/2021    Diet :  Diet Order             DIET SOFT Room service appropriate? Yes; Fluid consistency: Thin  Diet effective now                    Inpatient Medications  Scheduled Meds:  ciprofloxacin  500 mg Oral Q breakfast   folic acid  1 mg Oral Daily   lactulose  30 g Oral QID   multivitamin with minerals  1 tablet Oral Daily   pantoprazole  40 mg Oral Q0600   potassium chloride  20 mEq Oral Once   predniSONE  40 mg Oral Q breakfast   propranolol  10 mg Oral BID   rifaximin  550 mg Oral BID   sodium chloride flush  3 mL Intravenous Q12H   spironolactone  100 mg Oral Daily   thiamine  100 mg Oral Daily   Continuous Infusions:  albumin human     PRN Meds:.acetaminophen **OR** acetaminophen, albumin human, nicotine, ondansetron **OR** ondansetron (ZOFRAN) IV, traZODone  Antibiotics  :    Anti-infectives (From admission, onward)    Start     Dose/Rate Route Frequency Ordered Stop   11/06/21 0800  ciprofloxacin (CIPRO) tablet 500 mg        500 mg Oral Daily with breakfast 11/05/21 2349     11/05/21 2345  rifaximin (XIFAXAN) tablet 550 mg        550 mg Oral 2 times daily 11/05/21 2342     11/05/21 2100  rifaximin (XIFAXAN) tablet 200 mg        200 mg Oral Once 11/05/21 2014 11/05/21 2133         Objective:   Vitals:   11/07/21 0029 11/07/21 0349 11/07/21 0702 11/07/21 0800  BP: 106/66 112/60 107/67 104/85  Pulse: 93 91 93 94  Resp: Temp: 99.4 F (37.4 C) 98.3 F (  36.8 C) 98 F (36.7 C) 98.8 F (37.1 C)  TempSrc: Oral Oral Oral Oral  SpO2: 90% 90%  97%    Wt Readings from Last 3 Encounters:  09/02/21 83.3 kg  08/25/21 82.3 kg  07/08/21 72.1 kg     Intake/Output Summary (Last 24 hours) at 11/07/2021 1000 Last data filed at 11/06/2021 1700 Gross per 24 hour  Intake --  Output 1400 ml  Net -1400 ml     Physical Exam  Awake, alert, intermittently confused, no focal deficits, mild left flank bruise from  fall, [place].AT,PERRAL Supple Neck, No JVD,   Symmetrical Chest wall movement, Good air movement bilaterally, CTAB RRR,No Gallops, Rubs or new Murmurs,  +ve B.Sounds, Abd Soft, No tenderness,   Left elbow good range of motion     Data Review:    CBC Recent Labs  Lab 11/05/21 1314 11/06/21 0603 11/07/21 0158  WBC 5.8 3.7* 7.0  HGB 13.5 10.4* 9.8*  HCT 40.2 29.0* 27.3*  PLT 70* 52* 59*  MCV 99.8 95.1 95.5  MCH 33.5 34.1* 34.3*  MCHC 33.6 35.9 35.9  RDW 15.0 14.7 14.3  LYMPHSABS 1.3  --  1.3  MONOABS 0.6  --  0.9  EOSABS 0.1  --  0.1  BASOSABS 0.0  --  0.0    Electrolytes Recent Labs  Lab 11/05/21 1314 11/05/21 1921 11/05/21 1924 11/06/21 0603 11/07/21 0158 11/07/21 0849  NA 133*  --   --  137 133*  --   K 4.0  --   --  3.6 3.2*  --   CL 103  --   --  106 105  --   CO2 20*  --   --  24 21*  --   GLUCOSE 121*  --   --  114* 144*  --   BUN 11  --   --  9 8  --   CREATININE 0.73  --   --  0.64 0.68  --   CALCIUM 9.2  --   --  8.7* 8.4*  --   AST 34  --   --  28 28  --   ALT 19  --   --  16 15  --   ALKPHOS 141*  --   --  113 127*  --   BILITOT 6.1*  --   --  4.4* 3.1*  --   ALBUMIN 2.8*  --   --  2.2* 2.1*  --   MG  --   --   --  1.7 1.8  --   CRP  --   --   --  0.5 0.6  --   INR  --  1.8*  --  1.9* 1.9*  --   TSH 1.802  --   --   --   --   --   AMMONIA  --   --  81* 49*  --  61*  BNP 42.5  --   --  37.4 65.6  --     ------------------------------------------------------------------------------------------------------------------ No results for input(s): "CHOL", "HDL", "LDLCALC", "TRIG", "CHOLHDL", "LDLDIRECT" in the last 72 hours.  Lab Results  Component Value Date   HGBA1C 4.7 (L) 10/14/2019    Recent Labs    11/05/21 1314  TSH 1.802    Radiology Reports ECHOCARDIOGRAM COMPLETE  Result Date: 11/06/2021    ECHOCARDIOGRAM REPORT   Patient Name:   Manuela NeptuneJUAN M [name] CORDOBA Date of Exam: 11/06/2021 Medical Rec #:  [id number]  Height:       65.0  in Accession #:    [id number]           Weight:       183.7 lb Date of Birth:  1981/01/07            BSA:          1.908 m Patient Age:    89 years             BP:           110/58 mmHg Patient Gender: M                    HR:           85 bpm. Exam Location:  Inpatient Procedure: 2D Echo, Cardiac Doppler and Color Doppler Indications:    R07.9 Chest Pain  History:        Patient has prior history of Echocardiogram examinations, most                 recent 06/17/2021. Tobacco and ETOH abuse, Cirrhosis of liver.  Sonographer:    Alvino Chapel RCS Referring Phys: [place]  1. Left ventricular ejection fraction, by estimation, is 65 to 70%. The left ventricle has normal function. The left ventricle has no regional wall motion abnormalities. There is mild concentric left ventricular hypertrophy. Left ventricular diastolic parameters are consistent with Grade I diastolic dysfunction (impaired relaxation).  2. Right ventricular systolic function is hyperdynamic. The right ventricular size is normal. There is normal pulmonary artery systolic pressure.  3. Left atrial size was mildly dilated.  4. The mitral valve is normal in structure. No evidence of mitral valve regurgitation. No evidence of mitral stenosis.  5. The aortic valve is tricuspid. Aortic valve regurgitation is not visualized. No aortic stenosis is present.  6. The inferior vena cava is normal in size with greater than 50% respiratory variability, suggesting right atrial pressure of 3 mmHg. Comparison(s): No significant change from prior study. FINDINGS  Left Ventricle: Left ventricular ejection fraction, by estimation, is 65 to 70%. The left ventricle has normal function. The left ventricle has no regional wall motion abnormalities. The left ventricular internal cavity size was normal in size. There is  mild concentric left ventricular hypertrophy. Left ventricular diastolic parameters are consistent with Grade I diastolic dysfunction  (impaired relaxation). Right Ventricle: The right ventricular size is normal. No increase in right ventricular wall thickness. Right ventricular systolic function is hyperdynamic. There is normal pulmonary artery systolic pressure. The tricuspid regurgitant velocity is 2.30 m/s, and with an assumed right atrial pressure of 3 mmHg, the estimated right ventricular systolic pressure is 89.2 mmHg. Left Atrium: Left atrial size was mildly dilated. Right Atrium: Right atrial size was normal in size. Pericardium: There is no evidence of pericardial effusion. Mitral Valve: The mitral valve is normal in structure. No evidence of mitral valve regurgitation. No evidence of mitral valve stenosis. Tricuspid Valve: The tricuspid valve is normal in structure. Tricuspid valve regurgitation is mild . No evidence of tricuspid stenosis. Aortic Valve: The aortic valve is tricuspid. Aortic valve regurgitation is not visualized. No aortic stenosis is present. Aortic valve mean gradient measures 7.0 mmHg. Aortic valve peak gradient measures 14.4 mmHg. Aortic valve area, by VTI measures 2.11  cm. Pulmonic Valve: The pulmonic valve was not well visualized. Pulmonic valve regurgitation is not visualized. Aorta: The aortic root is normal in size and structure and the ascending  aorta was not well visualized. Venous: The inferior vena cava is normal in size with greater than 50% respiratory variability, suggesting right atrial pressure of 3 mmHg. IAS/Shunts: No atrial level shunt detected by color flow Doppler.  LEFT VENTRICLE PLAX 2D LVIDd:         4.60 cm   Diastology LVIDs:         2.50 cm   LV e' medial:    8.05 cm/s LV PW:         1.20 cm   LV E/e' medial:  7.3 LV IVS:        1.20 cm   LV e' lateral:   9.14 cm/s LVOT diam:     1.90 cm   LV E/e' lateral: 6.5 LV SV:         73 LV SV Index:   38 LVOT Area:     2.84 cm  RIGHT VENTRICLE RV S prime:     23.50 cm/s TAPSE (M-mode): 2.4 cm LEFT ATRIUM             Index        RIGHT ATRIUM            Index LA diam:        4.70 cm 2.46 cm/m   RA Area:     20.30 cm LA Vol (A2C):   55.8 ml 29.24 ml/m  RA Volume:   58.90 ml  30.87 ml/m LA Vol (A4C):   70.3 ml 36.84 ml/m LA Biplane Vol: 62.9 ml 32.97 ml/m  AORTIC VALVE AV Area (Vmax):    1.72 cm AV Area (Vmean):   2.00 cm AV Area (VTI):     2.11 cm AV Vmax:           190.00 cm/s AV Vmean:          117.500 cm/s AV VTI:            0.349 m AV Peak Grad:      14.4 mmHg AV Mean Grad:      7.0 mmHg LVOT Vmax:         115.00 cm/s LVOT Vmean:        82.900 cm/s LVOT VTI:          0.259 m LVOT/AV VTI ratio: 0.74  AORTA Ao Root diam: 3.10 cm MITRAL VALVE               TRICUSPID VALVE MV Area (PHT): 2.66 cm    TR Peak grad:   21.2 mmHg MV Decel Time: 285 msec    TR Vmax:        230.00 cm/s MV E velocity: 59.10 cm/s MV A velocity: 75.20 cm/s  SHUNTS MV E/A ratio:  0.79        Systemic VTI:  0.26 m                            Systemic Diam: 1.90 cm Riley Lam MD Electronically signed by Riley Lam MD Signature Date/Time: 11/06/2021/3:59:57 PM    Final    [place] ASCITES (ABDOMEN LIMITED)  Result Date: 11/06/2021 CLINICAL DATA:  Assess ascites. EXAM: LIMITED ABDOMEN ULTRASOUND FOR ASCITES TECHNIQUE: Limited ultrasound survey for ascites was performed in all four abdominal quadrants. COMPARISON:  None Available. FINDINGS: A small amount of ascites is identified throughout the abdomen. No drainable collection of ascites is noted. IMPRESSION: Small amount of ascites. The amount of ascites is not amenable to  paracentesis. Electronically Signed   By: Gerome Sam III M.D.   On: 11/06/2021 10:13   CT Head Wo Contrast  Result Date: 11/05/2021 CLINICAL DATA:  Cirrhosis, altered level of consciousness, confusion EXAM: CT HEAD WITHOUT CONTRAST TECHNIQUE: Contiguous axial images were obtained from the base of the skull through the vertex without intravenous contrast. RADIATION DOSE REDUCTION: This exam was performed according to the departmental  dose-optimization program which includes automated exposure control, adjustment of the mA and/or kV according to patient size and/or use of iterative reconstruction technique. COMPARISON:  06/30/2021 FINDINGS: Brain: No acute infarct or hemorrhage. Lateral ventricles and midline structures are unremarkable. No acute extra-axial fluid collections. No mass effect. Vascular: No hyperdense vessel or unexpected calcification. Skull: Normal. Negative for fracture or focal lesion. Sinuses/Orbits: No acute finding. Other: None. IMPRESSION: 1. No acute intracranial process. Electronically Signed   By: Sharlet Salina M.D.   On: 11/05/2021 18:08   CT ABDOMEN PELVIS W CONTRAST  Result Date: 11/05/2021 CLINICAL DATA:  Acute abdominal pain. History of cirrhosis and ascites. EXAM: CT ABDOMEN AND PELVIS WITH CONTRAST TECHNIQUE: Multidetector CT imaging of the abdomen and pelvis was performed using the standard protocol following bolus administration of intravenous contrast. RADIATION DOSE REDUCTION: This exam was performed according to the departmental dose-optimization program which includes automated exposure control, adjustment of the mA and/or kV according to patient size and/or use of iterative reconstruction technique. CONTRAST:  31mL OMNIPAQUE IOHEXOL 350 MG/ML SOLN COMPARISON:  CT abdomen and pelvis 09/01/2021 FINDINGS: Lower chest: No acute abnormality. Hepatobiliary: Gallstones are present. Mild nodular liver contour persists. No focal liver lesions are seen. Gallstones are present. There is no biliary ductal dilatation. Pancreas: Unremarkable. No pancreatic ductal dilatation or surrounding inflammatory changes. Spleen: Borderline enlarged, unchanged. Adrenals/Urinary Tract: Adrenal glands are unremarkable. Kidneys are normal, without renal calculi, focal lesion, or hydronephrosis. Bladder is unremarkable. Stomach/Bowel: There is diffuse gaseous distention of the colon without dilatation. There is no focal wall  thickening or inflammation. The appendix, small bowel and stomach are within normal limits. Vascular/Lymphatic: No significant vascular findings are present. No enlarged abdominal or pelvic lymph nodes. Reproductive: Prostate is unremarkable. Other: No abdominal wall hernia or abnormality. No abdominopelvic ascites. Musculoskeletal: No acute or significant osseous findings. IMPRESSION: 1. No acute localizing process in the abdomen or pelvis. 2. Stable findings of cirrhosis and portal hypertension. 3. Cholelithiasis. Electronically Signed   By: Darliss Cheney M.D.   On: 11/05/2021 18:08   DG Chest 2 View  Result Date: 11/05/2021 CLINICAL DATA:  Altered mental status EXAM: CHEST - 2 VIEW COMPARISON:  07/19/2021 FINDINGS: Transverse diameter of heart is increased. There are no signs of pulmonary edema or focal pulmonary consolidation. There is no pleural effusion or pneumothorax. IMPRESSION: There are no focal infiltrates or signs of pulmonary edema. Electronically Signed   By: Ernie Avena M.D.   On: 11/05/2021 13:27      Signature  Susa Raring M.D on 11/07/2021 at 10:00 AM   -  To page go to [email or web address]

## 2021-11-07 NOTE — Progress Notes (Signed)
This RN heard bed exit alarm and rushed to bedside and found patient on the floor on his left elbow. Patient was assisted to bed.  Vital sign was taken.  On assessment, patient reported that he hit his elbow.  He complaint of pain and tylenol was given. Dr. Ward Chatters notified and at bedside.  An order for X-ray received.  Patient's daughter, Vernell Leep, was called and notified of the fall. He is in no acute distress. He is in bed having his breakfast.

## 2021-11-08 LAB — CBC WITH DIFFERENTIAL/PLATELET
Abs Immature Granulocytes: 0.03 10*3/uL (ref 0.00–0.07)
Basophils Absolute: 0 10*3/uL (ref 0.0–0.1)
Basophils Relative: 0 %
Eosinophils Absolute: 0 10*3/uL (ref 0.0–0.5)
Eosinophils Relative: 1 %
HCT: 27.1 % — ABNORMAL LOW (ref 39.0–52.0)
Hemoglobin: 9.6 g/dL — ABNORMAL LOW (ref 13.0–17.0)
Immature Granulocytes: 1 %
Lymphocytes Relative: 22 %
Lymphs Abs: 1.2 10*3/uL (ref 0.7–4.0)
MCH: 34.2 pg — ABNORMAL HIGH (ref 26.0–34.0)
MCHC: 35.4 g/dL (ref 30.0–36.0)
MCV: 96.4 fL (ref 80.0–100.0)
Monocytes Absolute: 0.7 10*3/uL (ref 0.1–1.0)
Monocytes Relative: 11 %
Neutro Abs: 3.8 10*3/uL (ref 1.7–7.7)
Neutrophils Relative %: 65 %
Platelets: 54 10*3/uL — ABNORMAL LOW (ref 150–400)
RBC: 2.81 MIL/uL — ABNORMAL LOW (ref 4.22–5.81)
RDW: 14.3 % (ref 11.5–15.5)
WBC: 5.8 10*3/uL (ref 4.0–10.5)
nRBC: 0 % (ref 0.0–0.2)

## 2021-11-08 LAB — COMPREHENSIVE METABOLIC PANEL
ALT: 17 U/L (ref 0–44)
AST: 27 U/L (ref 15–41)
Albumin: 2 g/dL — ABNORMAL LOW (ref 3.5–5.0)
Alkaline Phosphatase: 113 U/L (ref 38–126)
Anion gap: 7 (ref 5–15)
BUN: 7 mg/dL (ref 6–20)
CO2: 20 mmol/L — ABNORMAL LOW (ref 22–32)
Calcium: 8.8 mg/dL — ABNORMAL LOW (ref 8.9–10.3)
Chloride: 108 mmol/L (ref 98–111)
Creatinine, Ser: 0.64 mg/dL (ref 0.61–1.24)
GFR, Estimated: >60 mL/min (ref >60)
Glucose, Bld: 115 mg/dL — ABNORMAL HIGH (ref 70–99)
Potassium: 4.1 mmol/L (ref 3.5–5.1)
Sodium: 135 mmol/L (ref 135–145)
Total Bilirubin: 3 mg/dL — ABNORMAL HIGH (ref 0.3–1.2)
Total Protein: 5.4 g/dL — ABNORMAL LOW (ref 6.5–8.1)

## 2021-11-08 LAB — PROTIME-INR
INR: 1.9 — ABNORMAL HIGH (ref 0.8–1.2)
Prothrombin Time: 21.2 seconds — ABNORMAL HIGH (ref 11.4–15.2)

## 2021-11-08 LAB — MAGNESIUM: Magnesium: 1.7 mg/dL (ref 1.7–2.4)

## 2021-11-08 LAB — AMMONIA: Ammonia: 41 umol/L — ABNORMAL HIGH (ref 9–35)

## 2021-11-08 MED ORDER — VITAMIN K1 10 MG/ML IJ SOLN
5.0000 mg | Freq: Once | INTRAVENOUS | Status: AC
  Administered 2021-11-08: 5 mg via INTRAVENOUS
  Filled 2021-11-08: qty 0.5

## 2021-11-08 MED ORDER — PREDNISOLONE 5 MG PO TABS
40.0000 mg | ORAL_TABLET | Freq: Every day | ORAL | Status: DC
  Administered 2021-11-08: 40 mg via ORAL
  Filled 2021-11-08 (×2): qty 8

## 2021-11-08 NOTE — Progress Notes (Signed)
PROGRESS NOTE                                                                                                                                                                                                             Patient Demographics:    Mathew [name], is a 41 y.o. male, DOB - [birth year], JIR:[id number]  Outpatient Primary MD for the patient is Mathew Stain, MD    LOS - 2  Admit date - 11/05/2021    Chief Complaint  Patient presents with   Altered Mental Status       Brief Narrative (HPI from H&P)   41 y.o. male with medical history significant for decompensated alcoholic cirrhosis who presents with altered mentation. History was limited due to patient's mental status and no family at bedside.   The patient reportedly has been feeling weak and disoriented over the last several days and had sought care in the [facility] ED prior to presentation.  He denies any recent illness and has been reported that he has been taking his lactulose consistently.  His last drink was 2-1/2 months prior to presentation.  He denies any nausea, vomiting, constipation, fever, chills, night sweats.  He was diagnosed with hepatic encephalopathy and admitted to the hospital.   Subjective:   Patient in bed, appears comfortable, denies any headache, no fever, no chest pain or pressure, no shortness of breath , no abdominal pain. No new focal weakness.    Assessment  & Plan :    Hepatic encephalopathy in a patient with alcoholic cirrhosis. He has been placed on lactulose along with Xifaxan, ammonia is coming down, mentation gradually improving, last drink over 1 month ago, CT head unremarkable and no headache.  Discriminant factor 42 hence we will start him on prednisolone.  Will order ultrasound-guided paracentesis if enough fluid is seen on the ultrasound with albumin administration.  Appropriate studies ordered.  On exam does not seem to  have significant ascites.  Continue beta-blocker for portal hypertension, continue Cipro for SBP prophylaxis which was started upon admission.   Nonspecific intermittent chest pain prior to admission.  Now resolved.  EKG with some nonspecific changes thickly flipped T waves in inferior leads, chest pain-free now, stable echocardiogram with preserved EF and no wall motion abnormality. Continue beta-blocker.  Candidate for aspirin due to auto anticoagulation and  high propensity to bleed.  Auto anticoagulation.  Due to cirrhosis.  IV vitamin K 11/08/2021.  Monitor.       Condition - Guarded  Family Communication  :  None  Code Status :  Full  Consults  :    PUD Prophylaxis : PPI   Procedures  :     Echocardiogram.   1. Left ventricular ejection fraction, by estimation, is 65 to 70%. The left ventricle has normal function. The left ventricle has no regional wall motion abnormalities. There is mild concentric left ventricular hypertrophy. Left ventricular diastolic parameters are consistent with Grade I diastolic dysfunction (impaired relaxation).  2. Right ventricular systolic function is hyperdynamic. The right ventricular size is normal. There is normal pulmonary artery systolic pressure.  3. Left atrial size was mildly dilated.  4. The mitral valve is normal in structure. No evidence of mitral valve regurgitation. No evidence of mitral stenosis.  5. The aortic valve is tricuspid. Aortic valve regurgitation is not visualized. No aortic stenosis is present.  6. The inferior vena cava is normal in size with greater than 50% respiratory variability, suggesting right atrial pressure of 3 mmHg  CT Abd Pelvis - 1. No acute localizing process in the abdomen or pelvis. 2. Stable findings of cirrhosis and portal hypertension. 3. Cholelithiasis  CT head.  Nonacute.      Disposition Plan  :    Status is: Observation   DVT Prophylaxis  :    Place and maintain sequential compression device  Start: 11/06/21 0541 SCDs Start: 11/05/21 2229    Lab Results  Component Value Date   PLT 54 (L) 11/08/2021    Diet :  Diet Order             DIET SOFT Room service appropriate? Yes; Fluid consistency: Thin  Diet effective now                    Inpatient Medications  Scheduled Meds:  ciprofloxacin  500 mg Oral Q breakfast   folic acid  1 mg Oral Daily   lactulose  30 g Oral QID   multivitamin with minerals  1 tablet Oral Daily   pantoprazole  40 mg Oral Q0600   prednisoLONE  40 mg Oral Daily   propranolol  10 mg Oral BID   rifaximin  550 mg Oral BID   sodium chloride flush  3 mL Intravenous Q12H   spironolactone  100 mg Oral Daily   thiamine  100 mg Oral Daily   Continuous Infusions:  phytonadione (VITAMIN K) 5 mg in dextrose 5 % 50 mL IVPB     PRN Meds:.acetaminophen **OR** acetaminophen, nicotine, ondansetron **OR** ondansetron (ZOFRAN) IV, traZODone  Objective:   Vitals:   11/07/21 2132 11/07/21 2341 11/08/21 0332 11/08/21 1010  BP: (!) 155/72 123/63 104/69 120/76  Pulse: 95 96 87 87  Resp:  18    Temp:  99.5 F (37.5 C) 98.9 F (37.2 C)   TempSrc:  Oral Oral   SpO2:  90% 95%     Wt Readings from Last 3 Encounters:  09/02/21 83.3 kg  08/25/21 82.3 kg  07/08/21 72.1 kg     Intake/Output Summary (Last 24 hours) at 11/08/2021 1024 Last data filed at 11/07/2021 2100 Gross per 24 hour  Intake --  Output 200 ml  Net -200 ml     Physical Exam  Awake, alert,  no focal deficits, mild left flank bruise from fall, [place].AT,PERRAL Supple Neck, No JVD,  Symmetrical Chest wall movement, Good air movement bilaterally, CTAB RRR,No Gallops, Rubs or new Murmurs,  +ve B.Sounds, Abd Soft, No tenderness,   No edema       Data Review:    CBC Recent Labs  Lab 11/05/21 1314 11/06/21 0603 11/07/21 0158 11/08/21 0300  WBC 5.8 3.7* 7.0 5.8  HGB 13.5 10.4* 9.8* 9.6*  HCT 40.2 29.0* 27.3* 27.1*  PLT 70* 52* 59* 54*  MCV 99.8 95.1 95.5 96.4  MCH  33.5 34.1* 34.3* 34.2*  MCHC 33.6 35.9 35.9 35.4  RDW 15.0 14.7 14.3 14.3  LYMPHSABS 1.3  --  1.3 1.2  MONOABS 0.6  --  0.9 0.7  EOSABS 0.1  --  0.1 0.0  BASOSABS 0.0  --  0.0 0.0    Electrolytes Recent Labs  Lab 11/05/21 1314 11/05/21 1921 11/05/21 1924 11/06/21 0603 11/07/21 0158 11/07/21 0849 11/08/21 0300  NA 133*  --   --  137 133*  --  135  K 4.0  --   --  3.6 3.2*  --  4.1  CL 103  --   --  106 105  --  108  CO2 20*  --   --  24 21*  --  20*  GLUCOSE 121*  --   --  114* 144*  --  115*  BUN 11  --   --  9 8  --  7  CREATININE 0.73  --   --  0.64 0.68  --  0.64  CALCIUM 9.2  --   --  8.7* 8.4*  --  8.8*  AST 34  --   --  28 28  --  27  ALT 19  --   --  16 15  --  17  ALKPHOS 141*  --   --  113 127*  --  113  BILITOT 6.1*  --   --  4.4* 3.1*  --  3.0*  ALBUMIN 2.8*  --   --  2.2* 2.1*  --  2.0*  MG  --   --   --  1.7 1.8  --  1.7  CRP  --   --   --  0.5 0.6  --   --   INR  --  1.8*  --  1.9* 1.9*  --  1.9*  TSH 1.802  --   --   --   --   --   --   AMMONIA  --   --  81* 49*  --  61* 41*  BNP 42.5  --   --  37.4 65.6  --   --     ------------------------------------------------------------------------------------------------------------------ No results for input(s): "CHOL", "HDL", "LDLCALC", "TRIG", "CHOLHDL", "LDLDIRECT" in the last 72 hours.  Lab Results  Component Value Date   HGBA1C 4.7 (L) 10/14/2019    Recent Labs    11/05/21 1314  TSH 1.802    Radiology Reports DG ELBOW COMPLETE LEFT (3+VIEW)  Result Date: 11/07/2021 CLINICAL DATA:  Pain after fall this morning. EXAM: LEFT ELBOW - COMPLETE 3+ VIEW COMPARISON:  None Available. FINDINGS: There is no evidence of fracture, dislocation, or joint effusion. There is no evidence of arthropathy or other focal bone abnormality. Soft tissues are unremarkable. IMPRESSION: Negative. Electronically Signed   By: Gerome Samavid  Williams III M.D.   On: 11/07/2021 09:58   ECHOCARDIOGRAM COMPLETE  Result Date:  11/06/2021    ECHOCARDIOGRAM REPORT   Patient Name:   Manuela NeptuneJUAN M [name] CORDOBA Date of Exam:  11/06/2021 Medical Rec #:  [id number]            Height:       65.0 in Accession #:    [id number]           Weight:       183.7 lb Date of Birth:  Dec 13, 1980            BSA:          1.908 m Patient Age:    41 years             BP:           110/58 mmHg Patient Gender: M                    HR:           85 bpm. Exam Location:  Inpatient Procedure: 2D Echo, Cardiac Doppler and Color Doppler Indications:    R07.9 Chest Pain  History:        Patient has prior history of Echocardiogram examinations, most                 recent 06/17/2021. Tobacco and ETOH abuse, Cirrhosis of liver.  Sonographer:    Celesta Gentile RCS Referring Phys: Effie Shy Stanford [facility] IMPRESSIONS  1. Left ventricular ejection fraction, by estimation, is 65 to 70%. The left ventricle has normal function. The left ventricle has no regional wall motion abnormalities. There is mild concentric left ventricular hypertrophy. Left ventricular diastolic parameters are consistent with Grade I diastolic dysfunction (impaired relaxation).  2. Right ventricular systolic function is hyperdynamic. The right ventricular size is normal. There is normal pulmonary artery systolic pressure.  3. Left atrial size was mildly dilated.  4. The mitral valve is normal in structure. No evidence of mitral valve regurgitation. No evidence of mitral stenosis.  5. The aortic valve is tricuspid. Aortic valve regurgitation is not visualized. No aortic stenosis is present.  6. The inferior vena cava is normal in size with greater than 50% respiratory variability, suggesting right atrial pressure of 3 mmHg. Comparison(s): No significant change from prior study. FINDINGS  Left Ventricle: Left ventricular ejection fraction, by estimation, is 65 to 70%. The left ventricle has normal function. The left ventricle has no regional wall motion abnormalities. The left ventricular internal cavity size was  normal in size. There is  mild concentric left ventricular hypertrophy. Left ventricular diastolic parameters are consistent with Grade I diastolic dysfunction (impaired relaxation). Right Ventricle: The right ventricular size is normal. No increase in right ventricular wall thickness. Right ventricular systolic function is hyperdynamic. There is normal pulmonary artery systolic pressure. The tricuspid regurgitant velocity is 2.30 m/s, and with an assumed right atrial pressure of 3 mmHg, the estimated right ventricular systolic pressure is 24.2 mmHg. Left Atrium: Left atrial size was mildly dilated. Right Atrium: Right atrial size was normal in size. Pericardium: There is no evidence of pericardial effusion. Mitral Valve: The mitral valve is normal in structure. No evidence of mitral valve regurgitation. No evidence of mitral valve stenosis. Tricuspid Valve: The tricuspid valve is normal in structure. Tricuspid valve regurgitation is mild . No evidence of tricuspid stenosis. Aortic Valve: The aortic valve is tricuspid. Aortic valve regurgitation is not visualized. No aortic stenosis is present. Aortic valve mean gradient measures 7.0 mmHg. Aortic valve peak gradient measures 14.4 mmHg. Aortic valve area, by VTI measures 2.11  cm. Pulmonic Valve: The pulmonic valve was not well visualized. Pulmonic valve regurgitation  is not visualized. Aorta: The aortic root is normal in size and structure and the ascending aorta was not well visualized. Venous: The inferior vena cava is normal in size with greater than 50% respiratory variability, suggesting right atrial pressure of 3 mmHg. IAS/Shunts: No atrial level shunt detected by color flow Doppler.  LEFT VENTRICLE PLAX 2D LVIDd:         4.60 cm   Diastology LVIDs:         2.50 cm   LV e' medial:    8.05 cm/s LV PW:         1.20 cm   LV E/e' medial:  7.3 LV IVS:        1.20 cm   LV e' lateral:   9.14 cm/s LVOT diam:     1.90 cm   LV E/e' lateral: 6.5 LV SV:         73 LV SV  Index:   38 LVOT Area:     2.84 cm  RIGHT VENTRICLE RV S prime:     23.50 cm/s TAPSE (M-mode): 2.4 cm LEFT ATRIUM             Index        RIGHT ATRIUM           Index LA diam:        4.70 cm 2.46 cm/m   RA Area:     20.30 cm LA Vol (A2C):   55.8 ml 29.24 ml/m  RA Volume:   58.90 ml  30.87 ml/m LA Vol (A4C):   70.3 ml 36.84 ml/m LA Biplane Vol: 62.9 ml 32.97 ml/m  AORTIC VALVE AV Area (Vmax):    1.72 cm AV Area (Vmean):   2.00 cm AV Area (VTI):     2.11 cm AV Vmax:           190.00 cm/s AV Vmean:          117.500 cm/s AV VTI:            0.349 m AV Peak Grad:      14.4 mmHg AV Mean Grad:      7.0 mmHg LVOT Vmax:         115.00 cm/s LVOT Vmean:        82.900 cm/s LVOT VTI:          0.259 m LVOT/AV VTI ratio: 0.74  AORTA Ao Root diam: 3.10 cm MITRAL VALVE               TRICUSPID VALVE MV Area (PHT): 2.66 cm    TR Peak grad:   21.2 mmHg MV Decel Time: 285 msec    TR Vmax:        230.00 cm/s MV E velocity: 59.10 cm/s MV A velocity: 75.20 cm/s  SHUNTS MV E/A ratio:  0.79        Systemic VTI:  0.26 m                            Systemic Diam: 1.90 cm Riley Lam MD Electronically signed by Riley Lam MD Signature Date/Time: 11/06/2021/3:59:57 PM    Final    [place] ASCITES (ABDOMEN LIMITED)  Result Date: 11/06/2021 CLINICAL DATA:  Assess ascites. EXAM: LIMITED ABDOMEN ULTRASOUND FOR ASCITES TECHNIQUE: Limited ultrasound survey for ascites was performed in all four abdominal quadrants. COMPARISON:  None Available. FINDINGS: A small amount of ascites is identified throughout the abdomen. No drainable collection of  ascites is noted. IMPRESSION: Small amount of ascites. The amount of ascites is not amenable to paracentesis. Electronically Signed   By: Gerome Sam III M.D.   On: 11/06/2021 10:13   CT Head Wo Contrast  Result Date: 11/05/2021 CLINICAL DATA:  Cirrhosis, altered level of consciousness, confusion EXAM: CT HEAD WITHOUT CONTRAST TECHNIQUE: Contiguous axial images were obtained  from the base of the skull through the vertex without intravenous contrast. RADIATION DOSE REDUCTION: This exam was performed according to the departmental dose-optimization program which includes automated exposure control, adjustment of the mA and/or kV according to patient size and/or use of iterative reconstruction technique. COMPARISON:  06/30/2021 FINDINGS: Brain: No acute infarct or hemorrhage. Lateral ventricles and midline structures are unremarkable. No acute extra-axial fluid collections. No mass effect. Vascular: No hyperdense vessel or unexpected calcification. Skull: Normal. Negative for fracture or focal lesion. Sinuses/Orbits: No acute finding. Other: None. IMPRESSION: 1. No acute intracranial process. Electronically Signed   By: Sharlet Salina M.D.   On: 11/05/2021 18:08   CT ABDOMEN PELVIS W CONTRAST  Result Date: 11/05/2021 CLINICAL DATA:  Acute abdominal pain. History of cirrhosis and ascites. EXAM: CT ABDOMEN AND PELVIS WITH CONTRAST TECHNIQUE: Multidetector CT imaging of the abdomen and pelvis was performed using the standard protocol following bolus administration of intravenous contrast. RADIATION DOSE REDUCTION: This exam was performed according to the departmental dose-optimization program which includes automated exposure control, adjustment of the mA and/or kV according to patient size and/or use of iterative reconstruction technique. CONTRAST:  25mL OMNIPAQUE IOHEXOL 350 MG/ML SOLN COMPARISON:  CT abdomen and pelvis 09/01/2021 FINDINGS: Lower chest: No acute abnormality. Hepatobiliary: Gallstones are present. Mild nodular liver contour persists. No focal liver lesions are seen. Gallstones are present. There is no biliary ductal dilatation. Pancreas: Unremarkable. No pancreatic ductal dilatation or surrounding inflammatory changes. Spleen: Borderline enlarged, unchanged. Adrenals/Urinary Tract: Adrenal glands are unremarkable. Kidneys are normal, without renal calculi, focal lesion,  or hydronephrosis. Bladder is unremarkable. Stomach/Bowel: There is diffuse gaseous distention of the colon without dilatation. There is no focal wall thickening or inflammation. The appendix, small bowel and stomach are within normal limits. Vascular/Lymphatic: No significant vascular findings are present. No enlarged abdominal or pelvic lymph nodes. Reproductive: Prostate is unremarkable. Other: No abdominal wall hernia or abnormality. No abdominopelvic ascites. Musculoskeletal: No acute or significant osseous findings. IMPRESSION: 1. No acute localizing process in the abdomen or pelvis. 2. Stable findings of cirrhosis and portal hypertension. 3. Cholelithiasis. Electronically Signed   By: Darliss Cheney M.D.   On: 11/05/2021 18:08   DG Chest 2 View  Result Date: 11/05/2021 CLINICAL DATA:  Altered mental status EXAM: CHEST - 2 VIEW COMPARISON:  07/19/2021 FINDINGS: Transverse diameter of heart is increased. There are no signs of pulmonary edema or focal pulmonary consolidation. There is no pleural effusion or pneumothorax. IMPRESSION: There are no focal infiltrates or signs of pulmonary edema. Electronically Signed   By: Ernie Avena M.D.   On: 11/05/2021 13:27      Signature  Susa Raring M.D on 11/08/2021 at 10:24 AM   -  To page go to [email or web address]

## 2021-11-08 NOTE — TOC Progression Note (Addendum)
Transition of Care [facility]) - Progression Note    Patient Details  Name: Mathew [name] MRN: [id number] Date of Birth: 1980-05-15  [facility] CM/SW Contact  Loletha Grayer Beverely Pace, RN Phone Number: 11/08/2021, 12:03 PM  Clinical Narrative:    Case Manager contacted [facility] to arrange follow up appointment. CM is waiting for return call, they have to try to work patient into a schedule.  12:09pm. There is no availability at [facility], patient has been scheduled to be seen on Thursday 11/11/21 @ 11:20am at the Patient [id number], 3rd floor,  [address].  CM will add this to AVS.         Expected Discharge Plan and Services     Discharge Planning Services: Follow-up appt scheduled, CM Consult                                           Social Determinants of Health (SDOH) Interventions    Readmission Risk Interventions     No data to display

## 2021-11-09 ENCOUNTER — Other Ambulatory Visit (HOSPITAL_COMMUNITY): Payer: Self-pay

## 2021-11-09 LAB — PROTIME-INR
INR: 1.9 — ABNORMAL HIGH (ref 0.8–1.2)
Prothrombin Time: 21.6 seconds — ABNORMAL HIGH (ref 11.4–15.2)

## 2021-11-09 LAB — COMPREHENSIVE METABOLIC PANEL
ALT: 20 U/L (ref 0–44)
AST: 29 U/L (ref 15–41)
Albumin: 2.1 g/dL — ABNORMAL LOW (ref 3.5–5.0)
Alkaline Phosphatase: 126 U/L (ref 38–126)
Anion gap: 7 (ref 5–15)
BUN: 7 mg/dL (ref 6–20)
CO2: 20 mmol/L — ABNORMAL LOW (ref 22–32)
Calcium: 8.9 mg/dL (ref 8.9–10.3)
Chloride: 108 mmol/L (ref 98–111)
Creatinine, Ser: 0.7 mg/dL (ref 0.61–1.24)
GFR, Estimated: >60 mL/min (ref >60)
Glucose, Bld: 144 mg/dL — ABNORMAL HIGH (ref 70–99)
Potassium: 4.2 mmol/L (ref 3.5–5.1)
Sodium: 135 mmol/L (ref 135–145)
Total Bilirubin: 2.6 mg/dL — ABNORMAL HIGH (ref 0.3–1.2)
Total Protein: 5.8 g/dL — ABNORMAL LOW (ref 6.5–8.1)

## 2021-11-09 MED ORDER — PREDNISONE 5 MG PO TABS
ORAL_TABLET | ORAL | 0 refills | Status: DC
  Filled 2021-11-09: qty 147, 26d supply, fill #0

## 2021-11-09 MED ORDER — NICOTINE 14 MG/24HR TD PT24
14.0000 mg | MEDICATED_PATCH | Freq: Every day | TRANSDERMAL | 0 refills | Status: DC
  Filled 2021-11-09 – 2021-11-11 (×2): qty 28, 28d supply, fill #0

## 2021-11-09 MED ORDER — RIFAXIMIN 550 MG PO TABS
550.0000 mg | ORAL_TABLET | Freq: Two times a day (BID) | ORAL | 0 refills | Status: DC
  Filled 2021-11-09: qty 60, 30d supply, fill #0

## 2021-11-09 MED ORDER — LACTULOSE 10 GM/15ML PO SOLN
30.0000 g | Freq: Four times a day (QID) | ORAL | 1 refills | Status: DC
  Filled 2021-11-09: qty 1419, 8d supply, fill #0
  Filled 2021-12-15: qty 1419, 8d supply, fill #1

## 2021-11-09 MED ORDER — PREDNISOLONE 5 MG PO TABS
ORAL_TABLET | ORAL | 0 refills | Status: DC
  Filled 2021-11-09: qty 220, fill #0

## 2021-11-09 NOTE — Care Management (Signed)
11-09-21 MATCH completed for this patient. Medications will be delivered to the room prior to transition home. No further needs identified at this time.

## 2021-11-09 NOTE — Plan of Care (Signed)
  Problem: Education: Goal: Knowledge of General Education information will improve Description: Including pain rating scale, medication(s)/side effects and non-pharmacologic comfort measures Outcome: Progressing   Problem: Activity: Goal: Risk for activity intolerance will decrease Outcome: Progressing   Problem: Nutrition: Goal: Adequate nutrition will be maintained Outcome: Progressing   Problem: Coping: Goal: Level of anxiety will decrease Outcome: Progressing   

## 2021-11-09 NOTE — TOC Progression Note (Signed)
Transition of Care [facility]) - Progression Note    Patient Details  Name: Mathew [name] MRN: [id number] Date of Birth: 24-Aug-1980  Transition [facility] Care [facility]) CM/SW [place], LCSW Phone Number: 11/09/2021, 9:16 AM  Clinical Narrative:    CSW spoke with patient using Spanish Interpretor regarding ETOH use. He stated that he is in a program now in [place] and has one month left to complete. CSW provided community resources. He asked CSW how to get glasses because he cannot see small lines. CSW encouraged him to follow up with [facility]. Patient denied any other questions.         Expected Discharge Plan and Services     Discharge Planning Services: Follow-up appt scheduled, CM Consult     Expected Discharge Date: 11/09/21                                     Social Determinants of Health (SDOH) Interventions    Readmission Risk Interventions     No data to display

## 2021-11-09 NOTE — Discharge Summary (Signed)
Mathew [name] YQM:[id number] DOB: 1980-03-10 DOA: 11/05/2021  PCP: [name], MD  Admit date: 11/05/2021  Discharge date: 11/09/2021  Admitted From: Home   Disposition:  Home   Recommendations for Outpatient Follow-up:   Follow up with PCP in 1-2 weeks  PCP Please obtain BMP/CBC, 2 view CXR in 1week,  (see Discharge instructions)   PCP Please follow up on the following pending results:    Home Health: None   Equipment/Devices: None  Consultations: None  Discharge Condition: Stable    CODE STATUS: Full    Diet Recommendation: Heart Healthy with 1.5 L fluid restriction.    Chief Complaint  Patient presents with   Altered Mental Status     Brief history of present illness from the day of admission and additional interim summary    41 y.o. male with medical history significant for decompensated alcoholic cirrhosis who presents with altered mentation. History was limited due to patient's mental status and no family at bedside.   The patient reportedly has been feeling weak and disoriented over the last several days and had sought care in the [facility] ED prior to presentation.  He denies any recent illness and has been reported that he has been taking his lactulose consistently.  His last drink was 2-1/2 months prior to presentation.  He denies any nausea, vomiting, constipation, fever, chills, night sweats.  He was diagnosed with hepatic encephalopathy and admitted to the hospital.                                                                 Hospital Course    Hepatic encephalopathy in a patient with alcoholic cirrhosis. He has been placed on lactulose along with Xifaxan, ammonia is coming down, mentation gradually improving, last drink over 1 month ago, CT head unremarkable and no headache.   Discriminant factor 42 hence we will start him on prednisolone for total of 4 weeks.  Ultrasound not enough ascitic fluid to be tapped.  Continue beta-blocker for portal hypertension, see 5 days of Cipro for SBP prophylaxis, now mental status back to baseline on combination of lactulose and Xifaxan.  Will be discharged home on both with close outpatient follow-up with PCP and [facility].     Nonspecific intermittent chest pain prior to admission.  Now resolved.  EKG with some nonspecific changes thickly flipped T waves in inferior leads, chest pain-free now, stable echocardiogram with preserved EF and no wall motion abnormality. Continue beta-blocker.  He is not candidate for aspirin due to auto anticoagulation and high propensity to bleed.   Auto anticoagulation.  Due to cirrhosis.  IV vitamin K 11/08/2021.  Stable.    Discharge diagnosis     Principal Problem:   Hepatic encephalopathy [facility])    Discharge instructions  Discharge Instructions     Amb Referral to Hepatology   Complete by: As directed    Diet - low sodium heart healthy   Complete by: As directed    Discharge instructions   Complete by: As directed    Follow with Primary MD [name], MD in 7 days   Get CBC, CMP, DCM, ammonia levels,-  checked next visit within 1 week by Primary MD   Activity: As tolerated with Full fall precautions use walker/cane & assistance as needed  Disposition Home   Diet: Heart Healthy with 1.5 L fluid restriction per day  Special Instructions: If you have smoked or chewed Tobacco  in the last 2 yrs please stop smoking, stop any regular Alcohol  and or any Recreational drug use.  On your next visit with your primary care physician please Get Medicines reviewed and adjusted.  Please request your Prim.MD to go over all Hospital Tests and Procedure/Radiological results at the follow up, please get all Hospital records sent to your Prim MD by signing hospital release before you go  home.  If you experience worsening of your admission symptoms, develop shortness of breath, life threatening emergency, suicidal or homicidal thoughts you must seek medical attention immediately by calling 911 or calling your MD immediately  if symptoms less severe.  You Must read complete instructions/literature along with all the possible adverse reactions/side effects for all the Medicines you take and that have been prescribed to you. Take any new Medicines after you have completely understood and accpet all the possible adverse reactions/side effects.   Increase activity slowly   Complete by: As directed        Discharge Medications   Allergies as of 11/09/2021   No Known Allergies      Medication List     TAKE these medications    FeroSul 325 (65 FE) MG tablet Generic drug: ferrous sulfate Tome 1 tableta (325 mg en total) por va oral diariamente con el desayuno. (Take 1 tablet (325 mg total) by mouth daily with breakfast.)   folic acid 1 MG tablet Commonly known as: FOLVITE Tome 1 tableta (1 mg en total) por va oral diariamente. (Take 1 tablet (1 mg total) by mouth daily.)   furosemide 40 MG tablet Commonly known as: LASIX Tome 1 tableta (40 mg en total) por va oral diariamente. (Take 1 tablet (40 mg total) by mouth daily.)   lactulose 10 GM/15ML solution Commonly known as: CHRONULAC Take 45 mLs (30 g total) by mouth every 6 (six) hours. What changed: when to take this   multivitamin with minerals Tabs tablet Take 1 tablet by mouth daily.   nicotine 14 mg/24hr patch Commonly known as: NICODERM CQ - dosed in mg/24 hours Place 1 patch (14 mg total) onto the skin daily.   pantoprazole 40 MG tablet Commonly known as: PROTONIX Take 1 tablet (40 mg total) by mouth daily at 6 (six) AM. What changed: when to take this   prednisoLONE 5 MG Tabs tablet Take 8 pills for 2 weeks then 5 pills for 3 days, then 3 pills for 3 days, then 2 pills for 3 days then 1 pill  for 3 days, finally half a pill for 3 days and stop.  Pharmacy to dispense accordingly.   propranolol 10 MG tablet Commonly known as: INDERAL Take 1 tablet (10 mg total) by mouth 2 (two) times daily.   rifaximin 550 MG Tabs tablet Commonly known as: XIFAXAN Take 1 tablet (550 mg  total) by mouth 2 (two) times daily.   spironolactone 100 MG tablet Commonly known as: ALDACTONE Tome 1 tableta (100 mg en total) por va oral diariamente. (Take 1 tablet (100 mg total) by mouth daily.)   thiamine 100 MG tablet Commonly known as: VITAMIN B1 Tome 1 tableta (100 mg en total) por va oral diariamente. (Take 1 tablet (100 mg total) by mouth daily.)   Vitamin D (Ergocalciferol) 1.25 MG (50000 UNIT) Caps capsule Commonly known as: DRISDOL Take 1 capsule (50,000 Units total) by mouth every Wednesday. What changed: when to take this         Follow-up Information      [facility]. Go to.   Specialty: Internal Medicine Why: your appointment is scheduled for Thurday, November 2,2023 at 11:20am- please arrive 15 minutes before appointment., Hospital follow-up Contact information: [address] [id number] [address] [id number] [phone]        Storm FriskWright, Patrick E, MD. Schedule an appointment as soon as possible for a visit in 1 week(s).   Specialty: Pulmonary Disease Contact information: [address] [place] [id number] [phone]         Kathi DerBrahmbhatt, Parag, MD. Schedule an appointment as soon as possible for a visit in 1 week(s).   Specialty: Gastroenterology Why: Alcoholic cirrhosis Contact information: [address] [place] [address] [phone]                 Major procedures and Radiology Reports - PLEASE review detailed and final reports thoroughly  -       DG ELBOW COMPLETE LEFT (3+VIEW)  Result Date: 11/07/2021 CLINICAL DATA:  Pain after fall this morning. EXAM: LEFT ELBOW - COMPLETE  3+ VIEW COMPARISON:  None Available. FINDINGS: There is no evidence of fracture, dislocation, or joint effusion. There is no evidence of arthropathy or other focal bone abnormality. Soft tissues are unremarkable. IMPRESSION: Negative. Electronically Signed   By: Gerome Samavid  Williams III M.D.   On: 11/07/2021 09:58   ECHOCARDIOGRAM COMPLETE  Result Date: 11/06/2021    ECHOCARDIOGRAM REPORT   Patient Name:   Mathew NeptuneJUAN M [name] CORDOBA Date of Exam: 11/06/2021 Medical Rec #:  [id number]            Height:       65.0 in Accession #:    [id number]           Weight:       183.7 lb Date of Birth:  12-15-1980            BSA:          1.908 m Patient Age:    41 years             BP:           110/58 mmHg Patient Gender: M                    HR:           85 bpm. Exam Location:  Inpatient Procedure: 2D Echo, Cardiac Doppler and Color Doppler Indications:    R07.9 Chest Pain  History:        Patient has prior history of Echocardiogram examinations, most                 recent 06/17/2021. Tobacco and ETOH abuse, Cirrhosis of liver.  Sonographer:    Celesta GentileBernard White RCS Referring Phys: Effie [id number] [place]ASHANT K [facility] IMPRESSIONS  1.  Left ventricular ejection fraction, by estimation, is 65 to 70%. The left ventricle has normal function. The left ventricle has no regional wall motion abnormalities. There is mild concentric left ventricular hypertrophy. Left ventricular diastolic parameters are consistent with Grade I diastolic dysfunction (impaired relaxation).  2. Right ventricular systolic function is hyperdynamic. The right ventricular size is normal. There is normal pulmonary artery systolic pressure.  3. Left atrial size was mildly dilated.  4. The mitral valve is normal in structure. No evidence of mitral valve regurgitation. No evidence of mitral stenosis.  5. The aortic valve is tricuspid. Aortic valve regurgitation is not visualized. No aortic stenosis is present.  6. The inferior vena cava is normal in size with greater than 50% respiratory  variability, suggesting right atrial pressure of 3 mmHg. Comparison(s): No significant change from prior study. FINDINGS  Left Ventricle: Left ventricular ejection fraction, by estimation, is 65 to 70%. The left ventricle has normal function. The left ventricle has no regional wall motion abnormalities. The left ventricular internal cavity size was normal in size. There is  mild concentric left ventricular hypertrophy. Left ventricular diastolic parameters are consistent with Grade I diastolic dysfunction (impaired relaxation). Right Ventricle: The right ventricular size is normal. No increase in right ventricular wall thickness. Right ventricular systolic function is hyperdynamic. There is normal pulmonary artery systolic pressure. The tricuspid regurgitant velocity is 2.30 m/s, and with an assumed right atrial pressure of 3 mmHg, the estimated right ventricular systolic pressure is 24.2 mmHg. Left Atrium: Left atrial size was mildly dilated. Right Atrium: Right atrial size was normal in size. Pericardium: There is no evidence of pericardial effusion. Mitral Valve: The mitral valve is normal in structure. No evidence of mitral valve regurgitation. No evidence of mitral valve stenosis. Tricuspid Valve: The tricuspid valve is normal in structure. Tricuspid valve regurgitation is mild . No evidence of tricuspid stenosis. Aortic Valve: The aortic valve is tricuspid. Aortic valve regurgitation is not visualized. No aortic stenosis is present. Aortic valve mean gradient measures 7.0 mmHg. Aortic valve peak gradient measures 14.4 mmHg. Aortic valve area, by VTI measures 2.11  cm. Pulmonic Valve: The pulmonic valve was not well visualized. Pulmonic valve regurgitation is not visualized. Aorta: The aortic root is normal in size and structure and the ascending aorta was not well visualized. Venous: The inferior vena cava is normal in size with greater than 50% respiratory variability, suggesting right atrial pressure of 3  mmHg. IAS/Shunts: No atrial level shunt detected by color flow Doppler.  LEFT VENTRICLE PLAX 2D LVIDd:         4.60 cm   Diastology LVIDs:         2.50 cm   LV e' medial:    8.05 cm/s LV PW:         1.20 cm   LV E/e' medial:  7.3 LV IVS:        1.20 cm   LV e' lateral:   9.14 cm/s LVOT diam:     1.90 cm   LV E/e' lateral: 6.5 LV SV:         73 LV SV Index:   38 LVOT Area:     2.84 cm  RIGHT VENTRICLE RV S prime:     23.50 cm/s TAPSE (M-mode): 2.4 cm LEFT ATRIUM             Index        RIGHT ATRIUM           Index LA  diam:        4.70 cm 2.46 cm/m   RA Area:     20.30 cm LA Vol (A2C):   55.8 ml 29.24 ml/m  RA Volume:   58.90 ml  30.87 ml/m LA Vol (A4C):   70.3 ml 36.84 ml/m LA Biplane Vol: 62.9 ml 32.97 ml/m  AORTIC VALVE AV Area (Vmax):    1.72 cm AV Area (Vmean):   2.00 cm AV Area (VTI):     2.11 cm AV Vmax:           190.00 cm/s AV Vmean:          117.500 cm/s AV VTI:            0.349 m AV Peak Grad:      14.4 mmHg AV Mean Grad:      7.0 mmHg LVOT Vmax:         115.00 cm/s LVOT Vmean:        82.900 cm/s LVOT VTI:          0.259 m LVOT/AV VTI ratio: 0.74  AORTA Ao Root diam: 3.10 cm MITRAL VALVE               TRICUSPID VALVE MV Area (PHT): 2.66 cm    TR Peak grad:   21.2 mmHg MV Decel Time: 285 msec    TR Vmax:        230.00 cm/s MV E velocity: 59.10 cm/s MV A velocity: 75.20 cm/s  SHUNTS MV E/A ratio:  0.79        Systemic VTI:  0.26 m                            Systemic Diam: 1.90 cm Riley Lam MD Electronically signed by Riley Lam MD Signature Date/Time: 11/06/2021/3:59:57 PM    Final    [place] ASCITES (ABDOMEN LIMITED)  Result Date: 11/06/2021 CLINICAL DATA:  Assess ascites. EXAM: LIMITED ABDOMEN ULTRASOUND FOR ASCITES TECHNIQUE: Limited ultrasound survey for ascites was performed in all four abdominal quadrants. COMPARISON:  None Available. FINDINGS: A small amount of ascites is identified throughout the abdomen. No drainable collection of ascites is noted. IMPRESSION: Small  amount of ascites. The amount of ascites is not amenable to paracentesis. Electronically Signed   By: Gerome Sam III M.D.   On: 11/06/2021 10:13   CT Head Wo Contrast  Result Date: 11/05/2021 CLINICAL DATA:  Cirrhosis, altered level of consciousness, confusion EXAM: CT HEAD WITHOUT CONTRAST TECHNIQUE: Contiguous axial images were obtained from the base of the skull through the vertex without intravenous contrast. RADIATION DOSE REDUCTION: This exam was performed according to the departmental dose-optimization program which includes automated exposure control, adjustment of the mA and/or kV according to patient size and/or use of iterative reconstruction technique. COMPARISON:  06/30/2021 FINDINGS: Brain: No acute infarct or hemorrhage. Lateral ventricles and midline structures are unremarkable. No acute extra-axial fluid collections. No mass effect. Vascular: No hyperdense vessel or unexpected calcification. Skull: Normal. Negative for fracture or focal lesion. Sinuses/Orbits: No acute finding. Other: None. IMPRESSION: 1. No acute intracranial process. Electronically Signed   By: Sharlet Salina M.D.   On: 11/05/2021 18:08   CT ABDOMEN PELVIS W CONTRAST  Result Date: 11/05/2021 CLINICAL DATA:  Acute abdominal pain. History of cirrhosis and ascites. EXAM: CT ABDOMEN AND PELVIS WITH CONTRAST TECHNIQUE: Multidetector CT imaging of the abdomen and pelvis was performed using the standard protocol following bolus administration of intravenous  contrast. RADIATION DOSE REDUCTION: This exam was performed according to the departmental dose-optimization program which includes automated exposure control, adjustment of the mA and/or kV according to patient size and/or use of iterative reconstruction technique. CONTRAST:  44mL OMNIPAQUE IOHEXOL 350 MG/ML SOLN COMPARISON:  CT abdomen and pelvis 09/01/2021 FINDINGS: Lower chest: No acute abnormality. Hepatobiliary: Gallstones are present. Mild nodular liver contour  persists. No focal liver lesions are seen. Gallstones are present. There is no biliary ductal dilatation. Pancreas: Unremarkable. No pancreatic ductal dilatation or surrounding inflammatory changes. Spleen: Borderline enlarged, unchanged. Adrenals/Urinary Tract: Adrenal glands are unremarkable. Kidneys are normal, without renal calculi, focal lesion, or hydronephrosis. Bladder is unremarkable. Stomach/Bowel: There is diffuse gaseous distention of the colon without dilatation. There is no focal wall thickening or inflammation. The appendix, small bowel and stomach are within normal limits. Vascular/Lymphatic: No significant vascular findings are present. No enlarged abdominal or pelvic lymph nodes. Reproductive: Prostate is unremarkable. Other: No abdominal wall hernia or abnormality. No abdominopelvic ascites. Musculoskeletal: No acute or significant osseous findings. IMPRESSION: 1. No acute localizing process in the abdomen or pelvis. 2. Stable findings of cirrhosis and portal hypertension. 3. Cholelithiasis. Electronically Signed   By: Ronney Asters M.D.   On: 11/05/2021 18:08   DG Chest 2 View  Result Date: 11/05/2021 CLINICAL DATA:  Altered mental status EXAM: CHEST - 2 VIEW COMPARISON:  07/19/2021 FINDINGS: Transverse diameter of heart is increased. There are no signs of pulmonary edema or focal pulmonary consolidation. There is no pleural effusion or pneumothorax. IMPRESSION: There are no focal infiltrates or signs of pulmonary edema. Electronically Signed   By: Elmer Picker M.D.   On: 11/05/2021 13:27      Today   Subjective    [facility] today has no headache,no chest abdominal pain,no new weakness tingling or numbness, feels much better wants to go home today.     Objective   Blood pressure 109/70, pulse 80, temperature 98.1 F (36.7 C), temperature source Oral, resp. rate 18, SpO2 98 %.   Intake/Output Summary (Last 24 hours) at 11/09/2021 0855 Last data filed at  11/09/2021 0100 Gross per 24 hour  Intake --  Output 352 ml  Net -352 ml    Exam  Awake Alert, No new F.N deficits,    [place].AT,PERRAL Supple Neck,   Symmetrical Chest wall movement, Good air movement bilaterally, CTAB RRR,No Gallops,   +ve B.Sounds, Abd Soft, Non tender,  Left posterior rib cage small 2 cm bruise stable   Data Review   Recent Labs  Lab 11/05/21 1314 11/06/21 0603 11/07/21 0158 11/08/21 0300  WBC 5.8 3.7* 7.0 5.8  HGB 13.5 10.4* 9.8* 9.6*  HCT 40.2 29.0* 27.3* 27.1*  PLT 70* 52* 59* 54*  MCV 99.8 95.1 95.5 96.4  MCH 33.5 34.1* 34.3* 34.2*  MCHC 33.6 35.9 35.9 35.4  RDW 15.0 14.7 14.3 14.3  LYMPHSABS 1.3  --  1.3 1.2  MONOABS 0.6  --  0.9 0.7  EOSABS 0.1  --  0.1 0.0  BASOSABS 0.0  --  0.0 0.0    Recent Labs  Lab 11/05/21 1314 11/05/21 1921 11/05/21 1924 11/06/21 0603 11/07/21 0158 11/07/21 0849 11/08/21 0300 11/09/21 0401  NA 133*  --   --  137 133*  --  135 135  K 4.0  --   --  3.6 3.2*  --  4.1 4.2  CL 103  --   --  106 105  --  108 108  CO2 20*  --   --  24 21*  --  20* 20*  GLUCOSE 121*  --   --  114* 144*  --  115* 144*  BUN 11  --   --  9 8  --  7 7  CREATININE 0.73  --   --  0.64 0.68  --  0.64 0.70  CALCIUM 9.2  --   --  8.7* 8.4*  --  8.8* 8.9  AST 34  --   --  28 28  --  27 29  ALT 19  --   --  16 15  --  17 20  ALKPHOS 141*  --   --  113 127*  --  113 126  BILITOT 6.1*  --   --  4.4* 3.1*  --  3.0* 2.6*  ALBUMIN 2.8*  --   --  2.2* 2.1*  --  2.0* 2.1*  MG  --   --   --  1.7 1.8  --  1.7  --   CRP  --   --   --  0.5 0.6  --   --   --   INR  --  1.8*  --  1.9* 1.9*  --  1.9* 1.9*  TSH 1.802  --   --   --   --   --   --   --   AMMONIA  --   --  81* 49*  --  61* 41*  --   BNP 42.5  --   --  37.4 65.6  --   --   --     Total Time in preparing paper work, data evaluation and todays exam - 35 minutes  Susa Raring M.D on 11/09/2021 at 8:55 AM  [facility]

## 2021-11-09 NOTE — Discharge Instructions (Signed)
Follow with Primary MD [name], MD in 7 days   Get CBC, CMP, DCM, ammonia levels,-  checked next visit within 1 week by Primary MD   Activity: As tolerated with Full fall precautions use walker/cane & assistance as needed  Disposition Home   Diet: Heart Healthy with 1.5 L fluid restriction per day  Special Instructions: If you have smoked or chewed Tobacco  in the last 2 yrs please stop smoking, stop any regular Alcohol  and or any Recreational drug use.  On your next visit with your primary care physician please Get Medicines reviewed and adjusted.  Please request your Prim.MD to go over all Hospital Tests and Procedure/Radiological results at the follow up, please get all Hospital records sent to your Prim MD by signing hospital release before you go home.  If you experience worsening of your admission symptoms, develop shortness of breath, life threatening emergency, suicidal or homicidal thoughts you must seek medical attention immediately by calling 911 or calling your MD immediately  if symptoms less severe.  You Must read complete instructions/literature along with all the possible adverse reactions/side effects for all the Medicines you take and that have been prescribed to you. Take any new Medicines after you have completely understood and accpet all the possible adverse reactions/side effects.

## 2021-11-09 NOTE — Progress Notes (Signed)
Pt has DC orders. Live spanish interpreter requested at [phone]. Spoke with PT cousin who is on the way to pick up pt on the floor.

## 2021-11-10 ENCOUNTER — Telehealth: Payer: Self-pay

## 2021-11-10 NOTE — Telephone Encounter (Addendum)
Transition Care Management Unsuccessful Follow-up Telephone Call  Call was placed with assistance of Spanish Interpreter [phone]/[employer]  Date of discharge and from where:  11/09/2021, [facility]  Attempts:  1st Attempt  Reason for unsuccessful TCM follow-up call:  Unable to leave message - [phone] was not accepting messages and [phone], did not have voicemail set up.  The patient has an appointment at [facility] with Lazaro Arms, NP -tomorrow, 11/11/2021.

## 2021-11-11 ENCOUNTER — Ambulatory Visit (HOSPITAL_COMMUNITY)
Admission: RE | Admit: 2021-11-11 | Discharge: 2021-11-11 | Disposition: A | Discharge status: Home / Self Care | Payer: Self-pay | Source: Ambulatory Visit | Attending: Nurse Practitioner | Admitting: Nurse Practitioner

## 2021-11-11 ENCOUNTER — Ambulatory Visit (INDEPENDENT_AMBULATORY_CARE_PROVIDER_SITE_OTHER): Payer: Self-pay | Admitting: Nurse Practitioner

## 2021-11-11 ENCOUNTER — Encounter: Payer: Self-pay | Admitting: Nurse Practitioner

## 2021-11-11 ENCOUNTER — Other Ambulatory Visit: Payer: Self-pay | Admitting: Critical Care Medicine

## 2021-11-11 ENCOUNTER — Other Ambulatory Visit: Payer: Self-pay

## 2021-11-11 VITALS — BP 138/78 | HR 96 | Ht 61.0 in | Wt 170.0 lb

## 2021-11-11 DIAGNOSIS — K7031 Alcoholic cirrhosis of liver with ascites: Secondary | ICD-10-CM

## 2021-11-11 DIAGNOSIS — R0602 Shortness of breath: Secondary | ICD-10-CM

## 2021-11-11 DIAGNOSIS — K429 Umbilical hernia without obstruction or gangrene: Secondary | ICD-10-CM

## 2021-11-11 MED ORDER — SPIRONOLACTONE 100 MG PO TABS
100.0000 mg | ORAL_TABLET | Freq: Every day | ORAL | 0 refills | Status: DC
  Filled 2021-11-11: qty 90, 90d supply, fill #0

## 2021-11-11 MED ORDER — PANTOPRAZOLE SODIUM 40 MG PO TBEC
40.0000 mg | DELAYED_RELEASE_TABLET | Freq: Every day | ORAL | 0 refills | Status: DC
  Filled 2021-11-11: qty 90, 90d supply, fill #0

## 2021-11-11 MED ORDER — FUROSEMIDE 40 MG PO TABS
40.0000 mg | ORAL_TABLET | Freq: Every day | ORAL | 0 refills | Status: DC
  Filled 2021-11-11: qty 90, 90d supply, fill #0

## 2021-11-11 MED ORDER — PROPRANOLOL HCL 10 MG PO TABS
10.0000 mg | ORAL_TABLET | Freq: Two times a day (BID) | ORAL | 0 refills | Status: DC
  Filled 2021-11-11: qty 180, 90d supply, fill #0

## 2021-11-11 NOTE — Progress Notes (Signed)
@Patient  ID: Mathew [name], male    DOB: 1980/06/08, 41 y.o.   MRN: [id number]  Chief Complaint  Patient presents with   Follow-up    Pt is here for a hos f/u Pt requesting clarification on what type of diet he should be on due to issues with his liver and kidneys     Referring provider: [id number], MD  Recent significant Events:  Hospital admission: 11/05/21  Hospital Course:  Hepatic encephalopathy in a patient with alcoholic cirrhosis. He has been placed on lactulose along with Xifaxan, ammonia is coming down, mentation gradually improving, last drink over 1 month ago, CT head unremarkable and no headache.  Discriminant factor 42 hence we will start him on prednisolone for total of 4 weeks.  Ultrasound not enough ascitic fluid to be tapped.  Continue beta-blocker for portal hypertension, see 5 days of Cipro for SBP prophylaxis, now mental status back to baseline on combination of lactulose and Xifaxan.  Will be discharged home on both with close outpatient follow-up with PCP and [facility].     Nonspecific intermittent chest pain prior to admission.  Now resolved.  EKG with some nonspecific changes thickly flipped T waves in inferior leads, chest pain-free now, stable echocardiogram with preserved EF and no wall motion abnormality. Continue beta-blocker.  He is not candidate for aspirin due to auto anticoagulation and high propensity to bleed.   Auto anticoagulation.  Due to cirrhosis.  IV vitamin K 11/08/2021.  Stable.    HPI  Patient presents today for hospital follow-up.  Please see hospital notes above.  Patient is here with his aunt today.  Interpreter is used for this visit today.  Patient states that he has not scheduled appointment with GI or hepatology per discharge instructions.  Patient does have his discharge paperwork with him today.  We discussed that he will need to schedule these appointments.  Patient is currently in rehab.  Patient's diet states that he  has only been taking prednisone and lactulose since discharge from the hospital we discussed discharge paperwork which specifically shows which medications he is supposed to take and when.  Patient does have refills on these medications at the pharmacy.  Patient's aunt states that they will pick these up today.  Overall patient states that he has been doing well since hospital discharge.  Denies f/c/s, n/v/d, hemoptysis, PND, leg swelling Denies chest pain or edema   No Known Allergies  Immunization History  Administered Date(s) Administered   Influenza, High Dose Seasonal PF 11/06/2020   Influenza,inj,Quad PF,6+ Mos 10/21/2016, 02/03/2020   Influenza-Unspecified 10/21/2016, 12/13/2018   PNEUMOCOCCAL CONJUGATE-20 11/26/2020   Tdap 10/21/2016    Past Medical History:  Diagnosis Date   Alcohol withdrawal seizure (HCC)    Cirrhosis (HCC)    ETOH abuse    Pneumonia 04/02/2020   SBP (spontaneous bacterial peritonitis) (HCC) 06/17/2021    Tobacco History: Social History   Tobacco Use  Smoking Status Every Day   Packs/day: 0.25   Types: Cigarettes   Start date: 08/11/2019  Smokeless Tobacco Never   Ready to quit: Not Answered Counseling given: Not Answered   Outpatient Encounter Medications as of 11/11/2021  Medication Sig   ferrous sulfate 325 (65 FE) MG tablet Take 1 tablet (325 mg total) by mouth daily with breakfast.   folic acid (FOLVITE) 1 MG tablet Take 1 tablet (1 mg total) by mouth daily.   furosemide (LASIX) 40 MG tablet Take 1 tablet (40 mg total)  by mouth daily.   lactulose (CHRONULAC) 10 GM/15ML solution Take 45 mLs (30 g total) by mouth every 6 (six) hours.   Multiple Vitamin (MULTIVITAMIN WITH MINERALS) TABS tablet Take 1 tablet by mouth daily.   nicotine (NICODERM CQ - DOSED IN MG/24 HOURS) 14 mg/24hr patch Place 1 patch (14 mg total) onto the skin daily.   pantoprazole (PROTONIX) 40 MG tablet Take 1 tablet (40 mg total) by mouth daily at 6 (six) AM. (Patient taking  differently: Take 40 mg by mouth daily.)   predniSONE (DELTASONE) 5 MG tablet Take 8 tabs for 2 weeks then 5 tabs for 3 days, then 3 tabs for 3 days, then 2 tabs for 3 days then 1 tab for 3 days, finally half a tab for 3 days and stop.   propranolol (INDERAL) 10 MG tablet Take 1 tablet (10 mg total) by mouth 2 (two) times daily.   rifaximin (XIFAXAN) 550 MG TABS tablet Take 1 tablet (550 mg total) by mouth 2 (two) times daily.   spironolactone (ALDACTONE) 100 MG tablet Take 1 tablet (100 mg total) by mouth daily.   thiamine (VITAMIN B1) 100 MG tablet Take 1 tablet (100 mg total) by mouth daily.   Vitamin D, Ergocalciferol, (DRISDOL) 1.25 MG (50000 UNIT) CAPS capsule Take 1 capsule (50,000 Units total) by mouth every Wednesday. (Patient taking differently: Take 50,000 Units by mouth every 7 (seven) days.)   No facility-administered encounter medications on file as of 11/11/2021.     Review of Systems  Review of Systems  Constitutional: Negative.   HENT: Negative.    Cardiovascular: Negative.   Gastrointestinal: Negative.   Allergic/Immunologic: Negative.   Neurological: Negative.   Psychiatric/Behavioral: Negative.         Physical Exam  There were no vitals taken for this visit.  Wt Readings from Last 5 Encounters:  09/02/21 183 lb 11.7 oz (83.3 kg)  08/25/21 181 lb 6.4 oz (82.3 kg)  07/08/21 159 lb (72.1 kg)  06/30/21 170 lb (77.1 kg)  06/21/21 170 lb 3.1 oz (77.2 kg)     Physical Exam Vitals and nursing note reviewed.  Constitutional:      General: He is not in acute distress.    Appearance: He is well-developed.  Cardiovascular:     Rate and Rhythm: Normal rate and regular rhythm.  Pulmonary:     Effort: Pulmonary effort is normal.     Breath sounds: Normal breath sounds.  Skin:    General: Skin is warm and dry.  Neurological:     Mental Status: He is alert and oriented to person, place, and time.      Lab Results:  CBC    Component Value Date/Time    WBC 5.8 11/08/2021 0300   RBC 2.81 (L) 11/08/2021 0300   HGB 9.6 (L) 11/08/2021 0300   HGB 9.7 (L) 08/25/2021 1048   HCT 27.1 (L) 11/08/2021 0300   HCT 27.2 (L) 08/25/2021 1048   PLT 54 (L) 11/08/2021 0300   PLT 38 (LL) 08/25/2021 1048   MCV 96.4 11/08/2021 0300   MCV 87 08/25/2021 1048   MCH 34.2 (H) 11/08/2021 0300   MCHC 35.4 11/08/2021 0300   RDW 14.3 11/08/2021 0300   RDW 15.6 (H) 08/25/2021 1048   LYMPHSABS 1.2 11/08/2021 0300   LYMPHSABS 0.5 (L) 08/25/2021 1048   MONOABS 0.7 11/08/2021 0300   EOSABS 0.0 11/08/2021 0300   EOSABS 0.0 08/25/2021 1048   BASOSABS 0.0 11/08/2021 0300   BASOSABS 0.0 08/25/2021  1048    BMET    Component Value Date/Time   NA 135 11/09/2021 0401   NA 140 08/25/2021 1048   K 4.2 11/09/2021 0401   CL 108 11/09/2021 0401   CO2 20 (L) 11/09/2021 0401   GLUCOSE 144 (H) 11/09/2021 0401   BUN 7 11/09/2021 0401   BUN 8 08/25/2021 1048   CREATININE 0.70 11/09/2021 0401   CREATININE 0.47 (L) 03/08/2021 1439   CALCIUM 8.9 11/09/2021 0401   GFRNONAA >60 11/09/2021 0401   GFRNONAA >60 03/08/2021 1439   GFRAA 151 02/03/2020 1554    BNP    Component Value Date/Time   BNP 65.6 11/07/2021 0158    ProBNP No results found for: "PROBNP"  Imaging: DG ELBOW COMPLETE LEFT (3+VIEW)  Result Date: 11/07/2021 CLINICAL DATA:  Pain after fall this morning. EXAM: LEFT ELBOW - COMPLETE 3+ VIEW COMPARISON:  None Available. FINDINGS: There is no evidence of fracture, dislocation, or joint effusion. There is no evidence of arthropathy or other focal bone abnormality. Soft tissues are unremarkable. IMPRESSION: Negative. Electronically Signed   By: Dorise Bullion III M.D.   On: 11/07/2021 09:58   ECHOCARDIOGRAM COMPLETE  Result Date: 11/06/2021    ECHOCARDIOGRAM REPORT   Patient Name:   PEDROHENRIQUE MCCONVILLE CORDOBA Date of Exam: 11/06/2021 Medical Rec #:  [id number]            Height:       65.0 in Accession #:    [id number]           Weight:       183.7 lb Date of  Birth:  07-28-1980            BSA:          1.908 m Patient Age:    55 years             BP:           110/58 mmHg Patient Gender: M                    HR:           85 bpm. Exam Location:  Inpatient Procedure: 2D Echo, Cardiac Doppler and Color Doppler Indications:    R07.9 Chest Pain  History:        Patient has prior history of Echocardiogram examinations, most                 recent 06/17/2021. Tobacco and ETOH abuse, Cirrhosis of liver.  Sonographer:    Alvino Chapel RCS Referring Phys: Aten Woden  1. Left ventricular ejection fraction, by estimation, is 65 to 70%. The left ventricle has normal function. The left ventricle has no regional wall motion abnormalities. There is mild concentric left ventricular hypertrophy. Left ventricular diastolic parameters are consistent with Grade I diastolic dysfunction (impaired relaxation).  2. Right ventricular systolic function is hyperdynamic. The right ventricular size is normal. There is normal pulmonary artery systolic pressure.  3. Left atrial size was mildly dilated.  4. The mitral valve is normal in structure. No evidence of mitral valve regurgitation. No evidence of mitral stenosis.  5. The aortic valve is tricuspid. Aortic valve regurgitation is not visualized. No aortic stenosis is present.  6. The inferior vena cava is normal in size with greater than 50% respiratory variability, suggesting right atrial pressure of 3 mmHg. Comparison(s): No significant change from prior study. FINDINGS  Left Ventricle: Left ventricular ejection fraction, by estimation, is  65 to 70%. The left ventricle has normal function. The left ventricle has no regional wall motion abnormalities. The left ventricular internal cavity size was normal in size. There is  mild concentric left ventricular hypertrophy. Left ventricular diastolic parameters are consistent with Grade I diastolic dysfunction (impaired relaxation). Right Ventricle: The right ventricular size is  normal. No increase in right ventricular wall thickness. Right ventricular systolic function is hyperdynamic. There is normal pulmonary artery systolic pressure. The tricuspid regurgitant velocity is 2.30 m/s, and with an assumed right atrial pressure of 3 mmHg, the estimated right ventricular systolic pressure is 24.2 mmHg. Left Atrium: Left atrial size was mildly dilated. Right Atrium: Right atrial size was normal in size. Pericardium: There is no evidence of pericardial effusion. Mitral Valve: The mitral valve is normal in structure. No evidence of mitral valve regurgitation. No evidence of mitral valve stenosis. Tricuspid Valve: The tricuspid valve is normal in structure. Tricuspid valve regurgitation is mild . No evidence of tricuspid stenosis. Aortic Valve: The aortic valve is tricuspid. Aortic valve regurgitation is not visualized. No aortic stenosis is present. Aortic valve mean gradient measures 7.0 mmHg. Aortic valve peak gradient measures 14.4 mmHg. Aortic valve area, by VTI measures 2.11  cm. Pulmonic Valve: The pulmonic valve was not well visualized. Pulmonic valve regurgitation is not visualized. Aorta: The aortic root is normal in size and structure and the ascending aorta was not well visualized. Venous: The inferior vena cava is normal in size with greater than 50% respiratory variability, suggesting right atrial pressure of 3 mmHg. IAS/Shunts: No atrial level shunt detected by color flow Doppler.  LEFT VENTRICLE PLAX 2D LVIDd:         4.60 cm   Diastology LVIDs:         2.50 cm   LV e' medial:    8.05 cm/s LV PW:         1.20 cm   LV E/e' medial:  7.3 LV IVS:        1.20 cm   LV e' lateral:   9.14 cm/s LVOT diam:     1.90 cm   LV E/e' lateral: 6.5 LV SV:         73 LV SV Index:   38 LVOT Area:     2.84 cm  RIGHT VENTRICLE RV S prime:     23.50 cm/s TAPSE (M-mode): 2.4 cm LEFT ATRIUM             Index        RIGHT ATRIUM           Index LA diam:        4.70 cm 2.46 cm/m   RA Area:     20.30 cm  LA Vol (A2C):   55.8 ml 29.24 ml/m  RA Volume:   58.90 ml  30.87 ml/m LA Vol (A4C):   70.3 ml 36.84 ml/m LA Biplane Vol: 62.9 ml 32.97 ml/m  AORTIC VALVE AV Area (Vmax):    1.72 cm AV Area (Vmean):   2.00 cm AV Area (VTI):     2.11 cm AV Vmax:           190.00 cm/s AV Vmean:          117.500 cm/s AV VTI:            0.349 m AV Peak Grad:      14.4 mmHg AV Mean Grad:      7.0 mmHg LVOT Vmax:  115.00 cm/s LVOT Vmean:        82.900 cm/s LVOT VTI:          0.259 m LVOT/AV VTI ratio: 0.74  AORTA Ao Root diam: 3.10 cm MITRAL VALVE               TRICUSPID VALVE MV Area (PHT): 2.66 cm    TR Peak grad:   21.2 mmHg MV Decel Time: 285 msec    TR Vmax:        230.00 cm/s MV E velocity: 59.10 cm/s MV A velocity: 75.20 cm/s  SHUNTS MV E/A ratio:  0.79        Systemic VTI:  0.26 m                            Systemic Diam: 1.90 cm Riley LamMahesh Chandrasekhar MD Electronically signed by Riley LamMahesh Chandrasekhar MD Signature Date/Time: 11/06/2021/3:59:57 PM    Final    US ASCITES (ABDOMEN LIMITED)  Result Date: 11/06/2021 CLINICAL DATA:  Assess ascites. EXAM: LIMITED ABDOMEN ULTRASOUND FOR ASCITES TECHNIQUE: Limited ultrasound survey for ascites was performed in all four abdominal quadrants. COMPARISON:  None Available. FINDINGS: A small amount of ascites is identified throughout the abdomen. No drainable collection of ascites is noted. IMPRESSION: Small amount of ascites. The amount of ascites is not amenable to paracentesis. Electronically Signed   By: Gerome Samavid  Williams III M.D.   On: 11/06/2021 10:13   CT Head Wo Contrast  Result Date: 11/05/2021 CLINICAL DATA:  Cirrhosis, altered level of consciousness, confusion EXAM: CT HEAD WITHOUT CONTRAST TECHNIQUE: Contiguous axial images were obtained from the base of the skull through the vertex without intravenous contrast. RADIATION DOSE REDUCTION: This exam was performed according to the departmental dose-optimization program which includes automated exposure control,  adjustment of the mA and/or kV according to patient size and/or use of iterative reconstruction technique. COMPARISON:  06/30/2021 FINDINGS: Brain: No acute infarct or hemorrhage. Lateral ventricles and midline structures are unremarkable. No acute extra-axial fluid collections. No mass effect. Vascular: No hyperdense vessel or unexpected calcification. Skull: Normal. Negative for fracture or focal lesion. Sinuses/Orbits: No acute finding. Other: None. IMPRESSION: 1. No acute intracranial process. Electronically Signed   By: Sharlet SalinaMichael  Brown M.D.   On: 11/05/2021 18:08   CT ABDOMEN PELVIS W CONTRAST  Result Date: 11/05/2021 CLINICAL DATA:  Acute abdominal pain. History of cirrhosis and ascites. EXAM: CT ABDOMEN AND PELVIS WITH CONTRAST TECHNIQUE: Multidetector CT imaging of the abdomen and pelvis was performed using the standard protocol following bolus administration of intravenous contrast. RADIATION DOSE REDUCTION: This exam was performed according to the departmental dose-optimization program which includes automated exposure control, adjustment of the mA and/or kV according to patient size and/or use of iterative reconstruction technique. CONTRAST:  75mL OMNIPAQUE IOHEXOL 350 MG/ML SOLN COMPARISON:  CT abdomen and pelvis 09/01/2021 FINDINGS: Lower chest: No acute abnormality. Hepatobiliary: Gallstones are present. Mild nodular liver contour persists. No focal liver lesions are seen. Gallstones are present. There is no biliary ductal dilatation. Pancreas: Unremarkable. No pancreatic ductal dilatation or surrounding inflammatory changes. Spleen: Borderline enlarged, unchanged. Adrenals/Urinary Tract: Adrenal glands are unremarkable. Kidneys are normal, without renal calculi, focal lesion, or hydronephrosis. Bladder is unremarkable. Stomach/Bowel: There is diffuse gaseous distention of the colon without dilatation. There is no focal wall thickening or inflammation. The appendix, small bowel and stomach are  within normal limits. Vascular/Lymphatic: No significant vascular findings are present. No enlarged abdominal or pelvic lymph  nodes. Reproductive: Prostate is unremarkable. Other: No abdominal wall hernia or abnormality. No abdominopelvic ascites. Musculoskeletal: No acute or significant osseous findings. IMPRESSION: 1. No acute localizing process in the abdomen or pelvis. 2. Stable findings of cirrhosis and portal hypertension. 3. Cholelithiasis. Electronically Signed   By: Darliss Cheney M.D.   On: 11/05/2021 18:08   DG Chest 2 View  Result Date: 11/05/2021 CLINICAL DATA:  Altered mental status EXAM: CHEST - 2 VIEW COMPARISON:  07/19/2021 FINDINGS: Transverse diameter of heart is increased. There are no signs of pulmonary edema or focal pulmonary consolidation. There is no pleural effusion or pneumothorax. IMPRESSION: There are no focal infiltrates or signs of pulmonary edema. Electronically Signed   By: Ernie Avena M.D.   On: 11/05/2021 13:27     Assessment & Plan:   1. Shortness of breath  - DG Chest 2 View  2. Alcoholic cirrhosis of liver with ascites (HCC)  - Basic Metabolic Panel - CBC - Ammonia  -please follow up with GI and hepatatology  - continue taking medications per discharge instructions from hospital  Follow up:  Follow up as scheduled with Dr. Alden Hipp, NP 11/11/2021

## 2021-11-11 NOTE — Patient Instructions (Addendum)
1. Shortness of breath  - DG Chest 2 View  2. Alcoholic cirrhosis of liver with ascites (HCC)  - Basic Metabolic Panel - CBC - Ammonia  -please follow up with GI and hepatatology  - continue taking medications per discharge instructions from hospital  Follow up:  Follow up as scheduled with Dr. Joya Gaskins

## 2021-11-12 LAB — BASIC METABOLIC PANEL
BUN/Creatinine Ratio: 12 (ref 9–20)
BUN: 8 mg/dL (ref 6–24)
CO2: 21 mmol/L (ref 20–29)
Calcium: 8.9 mg/dL (ref 8.7–10.2)
Chloride: 105 mmol/L (ref 96–106)
Creatinine, Ser: 0.69 mg/dL — ABNORMAL LOW (ref 0.76–1.27)
Glucose: 144 mg/dL — ABNORMAL HIGH (ref 70–99)
Potassium: 3.9 mmol/L (ref 3.5–5.2)
Sodium: 137 mmol/L (ref 134–144)
eGFR: 118 mL/min/{1.73_m2} (ref >59)

## 2021-11-12 LAB — CBC
Hematocrit: 28.6 % — ABNORMAL LOW (ref 37.5–51.0)
Hemoglobin: 10.6 g/dL — ABNORMAL LOW (ref 13.0–17.7)
MCH: 34.8 pg — ABNORMAL HIGH (ref 26.6–33.0)
MCHC: 37.1 g/dL — ABNORMAL HIGH (ref 31.5–35.7)
MCV: 94 fL (ref 79–97)
Platelets: 59 10*3/uL — CL (ref 150–450)
RBC: 3.05 x10E6/uL — ABNORMAL LOW (ref 4.14–5.80)
RDW: 13.8 % (ref 11.6–15.4)
WBC: 4 10*3/uL (ref 3.4–10.8)

## 2021-11-12 LAB — AMMONIA: Ammonia: 129 ug/dL (ref 40–200)

## 2021-11-15 ENCOUNTER — Inpatient Hospital Stay (HOSPITAL_COMMUNITY)
Admission: EM | Admit: 2021-11-15 | Discharge: 2021-11-18 | DRG: 372 | Disposition: A | Discharge status: Home / Self Care | Payer: Self-pay | Attending: Family Medicine | Admitting: Family Medicine

## 2021-11-15 ENCOUNTER — Emergency Department (HOSPITAL_COMMUNITY): Payer: Self-pay

## 2021-11-15 ENCOUNTER — Encounter (HOSPITAL_COMMUNITY): Payer: Self-pay | Admitting: Emergency Medicine

## 2021-11-15 DIAGNOSIS — Z5989 Other problems related to housing and economic circumstances: Secondary | ICD-10-CM

## 2021-11-15 DIAGNOSIS — K429 Umbilical hernia without obstruction or gangrene: Secondary | ICD-10-CM | POA: Diagnosis present

## 2021-11-15 DIAGNOSIS — K8070 Calculus of gallbladder and bile duct without cholecystitis without obstruction: Secondary | ICD-10-CM | POA: Diagnosis present

## 2021-11-15 DIAGNOSIS — R109 Unspecified abdominal pain: Secondary | ICD-10-CM

## 2021-11-15 DIAGNOSIS — D696 Thrombocytopenia, unspecified: Secondary | ICD-10-CM | POA: Diagnosis present

## 2021-11-15 DIAGNOSIS — E871 Hypo-osmolality and hyponatremia: Secondary | ICD-10-CM | POA: Diagnosis present

## 2021-11-15 DIAGNOSIS — K7290 Hepatic failure, unspecified without coma: Secondary | ICD-10-CM

## 2021-11-15 DIAGNOSIS — D539 Nutritional anemia, unspecified: Secondary | ICD-10-CM | POA: Diagnosis present

## 2021-11-15 DIAGNOSIS — R161 Splenomegaly, not elsewhere classified: Secondary | ICD-10-CM | POA: Diagnosis present

## 2021-11-15 DIAGNOSIS — K7011 Alcoholic hepatitis with ascites: Secondary | ICD-10-CM | POA: Diagnosis present

## 2021-11-15 DIAGNOSIS — K7030 Alcoholic cirrhosis of liver without ascites: Secondary | ICD-10-CM | POA: Diagnosis present

## 2021-11-15 DIAGNOSIS — K652 Spontaneous bacterial peritonitis: Principal | ICD-10-CM | POA: Diagnosis present

## 2021-11-15 DIAGNOSIS — R1011 Right upper quadrant pain: Secondary | ICD-10-CM

## 2021-11-15 DIAGNOSIS — R1084 Generalized abdominal pain: Secondary | ICD-10-CM

## 2021-11-15 DIAGNOSIS — Z79899 Other long term (current) drug therapy: Secondary | ICD-10-CM

## 2021-11-15 DIAGNOSIS — F1010 Alcohol abuse, uncomplicated: Secondary | ICD-10-CM | POA: Diagnosis present

## 2021-11-15 DIAGNOSIS — K7682 Hepatic encephalopathy: Secondary | ICD-10-CM | POA: Diagnosis present

## 2021-11-15 DIAGNOSIS — K7031 Alcoholic cirrhosis of liver with ascites: Secondary | ICD-10-CM | POA: Diagnosis present

## 2021-11-15 DIAGNOSIS — E876 Hypokalemia: Secondary | ICD-10-CM | POA: Diagnosis present

## 2021-11-15 DIAGNOSIS — K766 Portal hypertension: Secondary | ICD-10-CM | POA: Diagnosis present

## 2021-11-15 DIAGNOSIS — K7460 Unspecified cirrhosis of liver: Secondary | ICD-10-CM | POA: Diagnosis present

## 2021-11-15 DIAGNOSIS — Y900 Blood alcohol level of less than 20 mg/100 ml: Secondary | ICD-10-CM | POA: Diagnosis present

## 2021-11-15 LAB — CBC WITH DIFFERENTIAL/PLATELET
Abs Immature Granulocytes: 0.07 10*3/uL (ref 0.00–0.07)
Basophils Absolute: 0 10*3/uL (ref 0.0–0.1)
Basophils Relative: 0 %
Eosinophils Absolute: 0.1 10*3/uL (ref 0.0–0.5)
Eosinophils Relative: 1 %
HCT: 31.7 % — ABNORMAL LOW (ref 39.0–52.0)
Hemoglobin: 11 g/dL — ABNORMAL LOW (ref 13.0–17.0)
Immature Granulocytes: 1 %
Lymphocytes Relative: 15 %
Lymphs Abs: 1 10*3/uL (ref 0.7–4.0)
MCH: 35.5 pg — ABNORMAL HIGH (ref 26.0–34.0)
MCHC: 34.7 g/dL (ref 30.0–36.0)
MCV: 102.3 fL — ABNORMAL HIGH (ref 80.0–100.0)
Monocytes Absolute: 1 10*3/uL (ref 0.1–1.0)
Monocytes Relative: 15 %
Neutro Abs: 4.9 10*3/uL (ref 1.7–7.7)
Neutrophils Relative %: 68 %
Platelets: 49 10*3/uL — ABNORMAL LOW (ref 150–400)
RBC: 3.1 MIL/uL — ABNORMAL LOW (ref 4.22–5.81)
RDW: 15.4 % (ref 11.5–15.5)
WBC: 7 10*3/uL (ref 4.0–10.5)
nRBC: 0 % (ref 0.0–0.2)

## 2021-11-15 LAB — URINALYSIS, MICROSCOPIC (REFLEX)

## 2021-11-15 LAB — COMPREHENSIVE METABOLIC PANEL
ALT: 29 U/L (ref 0–44)
AST: 38 U/L (ref 15–41)
Albumin: 2.5 g/dL — ABNORMAL LOW (ref 3.5–5.0)
Alkaline Phosphatase: 114 U/L (ref 38–126)
Anion gap: 7 (ref 5–15)
BUN: 10 mg/dL (ref 6–20)
CO2: 21 mmol/L — ABNORMAL LOW (ref 22–32)
Calcium: 8.3 mg/dL — ABNORMAL LOW (ref 8.9–10.3)
Chloride: 104 mmol/L (ref 98–111)
Creatinine, Ser: 0.85 mg/dL (ref 0.61–1.24)
GFR, Estimated: >60 mL/min (ref >60)
Glucose, Bld: 168 mg/dL — ABNORMAL HIGH (ref 70–99)
Potassium: 3.1 mmol/L — ABNORMAL LOW (ref 3.5–5.1)
Sodium: 132 mmol/L — ABNORMAL LOW (ref 135–145)
Total Bilirubin: 5.1 mg/dL — ABNORMAL HIGH (ref 0.3–1.2)
Total Protein: 5.9 g/dL — ABNORMAL LOW (ref 6.5–8.1)

## 2021-11-15 LAB — URINALYSIS, ROUTINE W REFLEX MICROSCOPIC
Glucose, UA: NEGATIVE mg/dL
Ketones, ur: NEGATIVE mg/dL
Leukocytes,Ua: NEGATIVE
Nitrite: POSITIVE — AB
Protein, ur: 30 mg/dL — AB
Specific Gravity, Urine: 1.025 (ref 1.005–1.030)
pH: 6 (ref 5.0–8.0)

## 2021-11-15 LAB — ETHANOL: Alcohol, Ethyl (B): <10 mg/dL (ref <10)

## 2021-11-15 LAB — LIPASE, BLOOD: Lipase: 44 U/L (ref 11–51)

## 2021-11-15 LAB — PROTIME-INR
INR: 2 — ABNORMAL HIGH (ref 0.8–1.2)
Prothrombin Time: 22.2 seconds — ABNORMAL HIGH (ref 11.4–15.2)

## 2021-11-15 MED ORDER — PIPERACILLIN-TAZOBACTAM 3.375 G IVPB
3.3750 g | Freq: Three times a day (TID) | INTRAVENOUS | Status: DC
  Administered 2021-11-16 – 2021-11-17 (×5): 3.375 g via INTRAVENOUS
  Filled 2021-11-15 (×5): qty 50

## 2021-11-15 MED ORDER — THIAMINE MONONITRATE 100 MG PO TABS
100.0000 mg | ORAL_TABLET | Freq: Every day | ORAL | Status: DC
  Administered 2021-11-16 – 2021-11-18 (×3): 100 mg via ORAL
  Filled 2021-11-15 (×3): qty 1

## 2021-11-15 MED ORDER — LACTULOSE 10 GM/15ML PO SOLN
30.0000 g | Freq: Four times a day (QID) | ORAL | Status: DC
  Administered 2021-11-16 – 2021-11-18 (×11): 30 g via ORAL
  Filled 2021-11-15 (×5): qty 45
  Filled 2021-11-15 (×2): qty 60
  Filled 2021-11-15: qty 45
  Filled 2021-11-15: qty 60
  Filled 2021-11-15: qty 45
  Filled 2021-11-15: qty 60

## 2021-11-15 MED ORDER — PIPERACILLIN-TAZOBACTAM 3.375 G IVPB 30 MIN
3.3750 g | Freq: Once | INTRAVENOUS | Status: AC
  Administered 2021-11-15: 3.375 g via INTRAVENOUS
  Filled 2021-11-15: qty 50

## 2021-11-15 MED ORDER — ACETAMINOPHEN 500 MG PO TABS
1000.0000 mg | ORAL_TABLET | Freq: Once | ORAL | Status: AC
  Administered 2021-11-15: 1000 mg via ORAL
  Filled 2021-11-15: qty 2

## 2021-11-15 MED ORDER — ACETAMINOPHEN 325 MG PO TABS: 650.0000 mg | ORAL_TABLET | Freq: Four times a day (QID) | ORAL | Status: DC | PRN

## 2021-11-15 MED ORDER — ACETAMINOPHEN 650 MG RE SUPP: 650.0000 mg | Freq: Four times a day (QID) | RECTAL | Status: DC | PRN

## 2021-11-15 MED ORDER — IOHEXOL 350 MG/ML SOLN
75.0000 mL | Freq: Once | INTRAVENOUS | Status: AC | PRN
  Administered 2021-11-15: 75 mL via INTRAVENOUS

## 2021-11-15 MED ORDER — FOLIC ACID 1 MG PO TABS
1.0000 mg | ORAL_TABLET | Freq: Every day | ORAL | Status: DC
  Administered 2021-11-16 – 2021-11-18 (×3): 1 mg via ORAL
  Filled 2021-11-15 (×3): qty 1

## 2021-11-15 MED ORDER — RIFAXIMIN 550 MG PO TABS
550.0000 mg | ORAL_TABLET | Freq: Two times a day (BID) | ORAL | Status: DC
  Administered 2021-11-16 – 2021-11-18 (×6): 550 mg via ORAL
  Filled 2021-11-15 (×6): qty 1

## 2021-11-15 NOTE — Assessment & Plan Note (Addendum)
Noted moderate ascites on CT abdomen/pelvis. Will attempt to get diagnostic paracentesis. Awaiting med reconciliation (several listed in chart were not in patient's bag) to add back medications but will restart the ones listed below.   - GI consult, appreciate recs - Last BM today. Holding lactulose given NPO status. - AM PT-INR, CBC, and CMET - IR paracentesis ordered - Continue rifaximin, thiamine, folic acid, lactulose

## 2021-11-15 NOTE — H&P (Cosign Needed Addendum)
Hospital Admission History and Physical Service Pager: [phone]  Patient name: Mathew [name] [facility] Medical record number: [id number] Date of Birth: 09/16/1980 Age: 41 y.o. Gender: male  Primary Care Provider: [name], MD Consultants: GI Code Status: Full Preferred Emergency Contact:  Mathew [name] (Brother) [phone] Aunt  Chief Complaint: Abm Pain  Assessment and Plan: Mathew [name] is a 41 y.o. male presenting with diffuse abdominal pain and swelling. Differential includes SBP, choledocholithiasis, and UTI. SBP is supported by hx of SBP, pt not taking cipro prophylaxis, fever, diffuse nature of tenderness. Choledocholithiasis is supported by CT findings. UTI is supported by UA results c/f UTI, but pt denies dysuria.  * Abdominal pain Presenting with 1 day hx of increasing diffuse abm pain. CTAP notable for gallstones and questionable stone in CBD. Developed fever to Tmax 101.6F in ED.  GI consulted in ED, plan to eval pt in AM. Overall, pt is clinically stable at this time. Plan to continue Zosyn which should cover for intra-abm infxn and UTI. Consider potential MRCP tomorrow, pending GI recs. Plan for paracentesis and f/u fluid studies to workup potential SBP. Will f/u Ucx to workup for UTI. - Admit to FMTS w/ attending Dr. McDiarmid.  - Start Zosyn - Tylenol prn for pain and fever - Ordered paracentesis, f/u fluid studies - Consult GI, appreciate recs - Consider MRCP for potential gall stone obstruction - f/u UCx - AM CBC, CMP, PT-INR - Restart Cipro prophylaxis at discharge. Does not appear to have been taking. - Last drink 2.16month ago. Did not place on CIWA, but monitor for clinical signs of withdrawal.  Alcoholic cirrhosis of liver with ascites ([place]) Noted moderate ascites on CT abdomen/pelvis. Will attempt to get diagnostic paracentesis. Awaiting med reconciliation (several listed in chart were not in patient's bag) to add back medications but will restart  the ones listed below.   - GI consult, appreciate recs - Last BM today. Holding lactulose given NPO status. - AM PT-INR, CBC, and CMET - IR paracentesis ordered - Continue rifaximin, thiamine, folic acid, lactulose   Chronic conditions Anemia - holding iron supplement due to infection concern Thrombocytopenia - trend with CBC   FEN/GI: NPO for potential GI procedure VTE Prophylaxis: SCDs. No pharmacological due to prolonged INR.  Disposition: Med Tele  History of Present Illness:  Mathew [name] is a 41 y.o. male presenting with increased abm pain. Pt reports increased abm pain since yesterday. The pain is diffuse, does not localize anywhere. Does not seem related to eating. Denies dysuria, N/V, shortness of breath, chest pain. Last BM today. Last took his medications this morning, but did not appear to be taking ciprofloxacin and there were several other medications listed in his chart that were not present in his medicine bag.   In the ED, initially tachycardic and hypertensive. CMP showed hyponatremia, hypokalemia, and low bicarb. CBC notable for macrocytic anemia. Lipase wnl. CTAP notable for contracted gallbladder w/ multiple stone, questionable stone in common bile duct, and moderate ascites. GI was made aware by ED. Pt started on Zosyn.   Pertinent Past Medical History: Alcoholic cirrhosis Previous alcohol use Remainder reviewed in history tab.   Pertinent Past Surgical History: No significant past surg hx Remainder reviewed in history tab.   Pertinent Social History: Lives with aunt Alcohol: none in >2 months Denies tobacco use or other illicit drugs  Pertinent Family History: No pertinent fam hx  Remainder reviewed in history tab.   Important Outpatient  Medications: Lactulose Spironolactone Rifaximin  Remainder reviewed in medication history.   Objective: BP 127/82   Pulse (!) 111   Temp (!) 100.4 F (38 C)   Resp 18   SpO2 98%  Exam: General:  Tired, but alert. Laying comfortably in bed. NAD. Eyes: Scleral icterus, EOMI HEENT: NCAT. MMM. Cardiovascular: RRR, no m/r/g appreciated Respiratory: CTAB. Normal WOB on RA. Gastrointestinal: Abm diffusely distended and mildly tender to palpation. No guarding or rebound. Normal BS.  Derm: Skin jaundiced, no obvious rashes or sores Neuro: Alert, grossly oriented. Talking and responding appropriately, following commands. No asterixis.  Labs:  CBC BMET  Recent Labs  Lab 11/15/21 0831  WBC 7.0  HGB 11.0*  HCT 31.7*  PLT 49*   Recent Labs  Lab 11/15/21 0831  NA 132*  K 3.1*  CL 104  CO2 21*  BUN 10  CREATININE 0.85  GLUCOSE 168*  CALCIUM 8.3*    Ethanol not detected Lipase wnl INR 2   Imaging Studies Performed:  CTAP 1. Liver cirrhosis. Interim development of at least moderate volume abdominopelvic ascites. Splenomegaly. 2. Contracted gallbladder with multiple stones. Questionable small stones within the common bile duct. No intra or extrahepatic biliary dilatation. 3. Fluid in the colon. Possible wall thickening of the right and proximal transverse colon which may be secondary to colitis or portal colopathy. 4. Interval small left effusion with passive atelectasis at the left base. 5. Slightly thick-walled appearance of the urinary bladder, correlate for cystitis.   Mathew Dice, MD 11/15/2021, 10:52 PM PGY-1, [place] Intern pager: [phone], text pages welcome Secure chat group [facility] Upper-Level Resident Addendum   I have independently interviewed and examined the patient. I have discussed the above with the original author and agree with their documentation. My edits for correction/addition/clarification are in within the document. Please see also any attending notes.   Mathew Patience, DO  PGY-3, [facility] 11/15/2021 10:52 PM  [place] Service pager: [phone] (text  pages welcome through [facility])

## 2021-11-15 NOTE — ED Triage Notes (Signed)
Pt here from home with c/o abd pain , pt has a chronic umbilical hernias that causes  him pain from time to tome , states no more etoh now

## 2021-11-15 NOTE — ED Provider Notes (Signed)
[facility] EMERGENCY DEPARTMENT Provider Note   CSN: [id number] Arrival date & time: 11/15/21  0815     History  Chief Complaint  Patient presents with   Abdominal Pain    Mathew [name] is a 41 year old male with a history of alcohol use disorder, alcohol induced cirrhosis who presents to the emergency department for abdominal pain.  The patient states that he noticed that his abdomen felt bloated yesterday, but has not had pain in his belly.  This morning however, he started having pain in his bilateral flanks that stretches across his lower back.  He thought that it had something to do with his kidneys.  He states that he still feels bloated in his abdomen, but does not have any pain.  He denies any fever, vomiting, diarrhea, hematuria, or dysuria.  He additionally denies any chest pain, shortness of breath, IV drug use, or recent fall/trauma.  He states that his last drink was more than 2 and half months ago.  He states that he has been eating and drinking well.  The history is provided by the patient and medical records.  Abdominal Pain      Home Medications Prior to Admission medications   Medication Sig Start Date End Date Taking? Authorizing Provider  ferrous sulfate 325 (65 FE) MG tablet Take 1 tablet (325 mg total) by mouth daily with breakfast. 08/25/21   [name], MD  folic acid (FOLVITE) 1 MG tablet Take 1 tablet (1 mg total) by mouth daily. 08/25/21 08/25/22  [name], MD  furosemide (LASIX) 40 MG tablet Take 1 tablet (40 mg total) by mouth daily. 11/11/21   [name], MD  lactulose (CHRONULAC) 10 GM/15ML solution Take 45 mLs (30 g total) by mouth every 6 (six) hours. 11/09/21   Leroy Sea, MD  Multiple Vitamin (MULTIVITAMIN WITH MINERALS) TABS tablet Take 1 tablet by mouth daily. 08/25/21   [name], MD  nicotine (NICODERM CQ - DOSED IN MG/24 HOURS) 14 mg/24hr patch Place 1 patch (14 mg total) onto the skin  daily. 11/09/21   Leroy Sea, MD  pantoprazole (PROTONIX) 40 MG tablet Take 1 tablet (40 mg total) by mouth daily at 6 (six) AM. 11/11/21   [name], MD  predniSONE (DELTASONE) 5 MG tablet Take 8 tabs for 2 weeks then 5 tabs for 3 days, then 3 tabs for 3 days, then 2 tabs for 3 days then 1 tab for 3 days, finally half a tab for 3 days and stop. 11/09/21   Leroy Sea, MD  propranolol (INDERAL) 10 MG tablet Take 1 tablet (10 mg total) by mouth 2 (two) times daily. 11/11/21   [name], MD  rifaximin (XIFAXAN) 550 MG TABS tablet Take 1 tablet (550 mg total) by mouth 2 (two) times daily. 11/09/21   Leroy Sea, MD  spironolactone (ALDACTONE) 100 MG tablet Take 1 tablet (100 mg total) by mouth daily. 11/11/21   [name], MD  thiamine (VITAMIN B1) 100 MG tablet Take 1 tablet (100 mg total) by mouth daily. 08/25/21   [name], MD  Vitamin D, Ergocalciferol, (DRISDOL) 1.25 MG (50000 UNIT) CAPS capsule Take 1 capsule (50,000 Units total) by mouth every Wednesday. Patient taking differently: Take 50,000 Units by mouth every 7 (seven) days. 07/14/21   [name], MD      Allergies    Patient has no known allergies.    Review  of Systems   Review of Systems  Gastrointestinal:  Positive for abdominal pain.    Physical Exam Updated Vital Signs BP 127/82   Pulse (!) 111   Temp (!) 100.4 F (38 C)   Resp 18   SpO2 98%   Physical Exam Vitals and nursing note reviewed.  Constitutional:      General: He is not in acute distress.    Appearance: He is not toxic-appearing.  HENT:     Head: Normocephalic and atraumatic.  Eyes:     General: Scleral icterus present.  Cardiovascular:     Rate and Rhythm: Normal rate and regular rhythm.     Heart sounds: Normal heart sounds.  Pulmonary:     Effort: Pulmonary effort is normal. No respiratory distress.     Breath sounds: Normal breath sounds.  Abdominal:     General: There is distension.      Palpations: Abdomen is soft.     Tenderness: There is abdominal tenderness.     Comments: Abdomen distended, positive fluid wave.  Very mild abdominal tenderness palpation, diffuse.  Mild left and right CVA tenderness.  Musculoskeletal:     Comments: No midline spinal tenderness to palpation.  Skin:    General: Skin is warm and dry.  Neurological:     General: No focal deficit present.     Mental Status: He is alert and oriented to person, place, and time.     Motor: No weakness.     ED Results / Procedures / Treatments   Labs (all labs ordered are listed, but only abnormal results are displayed) Labs Reviewed  COMPREHENSIVE METABOLIC PANEL - Abnormal; Notable for the following components:      Result Value   Sodium 132 (*)    Potassium 3.1 (*)    CO2 21 (*)    Glucose, Bld 168 (*)    Calcium 8.3 (*)    Total Protein 5.9 (*)    Albumin 2.5 (*)    Total Bilirubin 5.1 (*)    All other components within normal limits  CBC WITH DIFFERENTIAL/PLATELET - Abnormal; Notable for the following components:   RBC 3.10 (*)    Hemoglobin 11.0 (*)    HCT 31.7 (*)    MCV 102.3 (*)    MCH 35.5 (*)    Platelets 49 (*)    All other components within normal limits  URINALYSIS, ROUTINE W REFLEX MICROSCOPIC - Abnormal; Notable for the following components:   Color, Urine ORANGE (*)    APPearance HAZY (*)    Hgb urine dipstick LARGE (*)    Bilirubin Urine MODERATE (*)    Protein, ur 30 (*)    Nitrite POSITIVE (*)    All other components within normal limits  PROTIME-INR - Abnormal; Notable for the following components:   Prothrombin Time 22.2 (*)    INR 2.0 (*)    All other components within normal limits  URINALYSIS, MICROSCOPIC (REFLEX) - Abnormal; Notable for the following components:   Bacteria, UA FEW (*)    All other components within normal limits  URINE CULTURE  LIPASE, BLOOD  ETHANOL  COMPREHENSIVE METABOLIC PANEL  CBC  PROTIME-INR    EKG None  Radiology CT ABDOMEN  PELVIS W CONTRAST  Result Date: 11/15/2021 CLINICAL DATA:  Flank pain elevated bilirubin EXAM: CT ABDOMEN AND PELVIS WITH CONTRAST TECHNIQUE: Multidetector CT imaging of the abdomen and pelvis was performed using the standard protocol following bolus administration of intravenous contrast. RADIATION DOSE REDUCTION: This  exam was performed according to the departmental dose-optimization program which includes automated exposure control, adjustment of the mA and/or kV according to patient size and/or use of iterative reconstruction technique. CONTRAST:  34mL OMNIPAQUE IOHEXOL 350 MG/ML SOLN COMPARISON:  CT 11/05/2021, 09/01/2021 FINDINGS: Lower chest: Lung bases demonstrate interval small left effusion with passive atelectasis at the left base. Hepatobiliary: Liver cirrhosis. Contracted gallbladder with multiple stones. No intra hepatic or extrahepatic biliary dilatation. Questionable small ductal stones, coronal series 6 image 59 Pancreas: Unremarkable. No pancreatic ductal dilatation or surrounding inflammatory changes. Spleen: Enlarged, craniocaudal measurement of 16.2 cm. Adrenals/Urinary Tract: Adrenal glands are normal. Kidneys show no hydronephrosis. Slightly thick-walled urinary bladder Stomach/Bowel: The stomach is moderately enlarged. There is no dilated small bowel. Fluid in the colon. There is possible wall thickening of the right and proximal transverse colon. Vascular/Lymphatic: Nonaneurysmal aorta.  No suspicious lymph node Reproductive: Prostate is unremarkable. Other: No free air. Interim development of at least moderate volume abdominopelvic ascites. Small umbilical region hernia containing fluid. Musculoskeletal: No acute osseous abnormality IMPRESSION: 1. Liver cirrhosis. Interim development of at least moderate volume abdominopelvic ascites. Splenomegaly. 2. Contracted gallbladder with multiple stones. Questionable small stones within the common bile duct. No intra or extrahepatic biliary  dilatation. 3. Fluid in the colon. Possible wall thickening of the right and proximal transverse colon which may be secondary to colitis or portal colopathy. 4. Interval small left effusion with passive atelectasis at the left base. 5. Slightly thick-walled appearance of the urinary bladder, correlate for cystitis. Electronically Signed   By: Jasmine Pang M.D.   On: 11/15/2021 19:24    Procedures Procedures    Medications Ordered in ED Medications  acetaminophen (TYLENOL) tablet 650 mg (has no administration in time range)    Or  acetaminophen (TYLENOL) suppository 650 mg (has no administration in time range)  folic acid (FOLVITE) tablet 1 mg (has no administration in time range)  lactulose (CHRONULAC) 10 GM/15ML solution 30 g (has no administration in time range)  rifaximin (XIFAXAN) tablet 550 mg (has no administration in time range)  thiamine (VITAMIN B1) tablet 100 mg (has no administration in time range)  piperacillin-tazobactam (ZOSYN) IVPB 3.375 g (has no administration in time range)  iohexol (OMNIPAQUE) 350 MG/ML injection 75 mL (75 mLs Intravenous Contrast Given 11/15/21 1834)  piperacillin-tazobactam (ZOSYN) IVPB 3.375 g (0 g Intravenous Stopped 11/15/21 2241)  acetaminophen (TYLENOL) tablet 1,000 mg (1,000 mg Oral Given 11/15/21 2102)    ED Course/ Medical Decision Making/ A&P Clinical Course as of 11/15/21 2341  Mon Nov 15, 2021  2023 Zosyn for ppx for stones [DG]    Clinical Course User Index [DG] Stephanie Coup, MD                           Medical Decision Making Amount and/or Complexity of Data Reviewed Labs: ordered. Radiology: ordered.  Risk OTC drugs. Prescription drug management. Decision regarding hospitalization.   Mathew [name] is a 41 year old male with a history of alcohol use disorder, alcohol induced cirrhosis who presents to the emergency department for flank pain.    On chart review, patient was admitted on 8/23 for similar  presentation of generalized abdominal pain x1 month and nausea, vomiting, and diarrhea.  Symptoms were ultimately attributed to acute on chronic alcoholic hepatitis, cirrhosis with ascites without SBP (confirmed by paracentesis on admission), and chronic but reducible umbilical hernia. He was discharged on ciprofloxacin 500mg  daily for SBP prophylaxis.  Will perform CT abdomen pelvis to evaluate for possible nephrolithiasis.  No fever or dysuria to suggest pyelonephritis, however will obtain UA to evaluate for possible UTI.  Low suspicion for SBP as patient is taking ciprofloxacin prophylactically, has no history of SBP, and has no fevers or significant abdominal tenderness palpation, though ascites does seem to be present on exam.  Patient does not have shortness of breath or hypoxia or significant pain in his abdomen, do not feel that therapeutic paracentesis warranted emergently at this time.  No recent falls or trauma, midline tenderness to palpation, or steroid use to suggest spinal fracture.  No perianal anesthesia or lower extremity weakness to suggest cauda equina or cord compression.  Workup: CBC with no leukocytosis to suggest significant infection, hemoglobin 11.0, up from most recent prior of 10.6 four days ago, platelets 49, chronic thrombocytopenia.  CMP with sodium 132, potassium 3.1, glucose 168, total bilirubin elevated at 5.1, which is an acute increase from most recent prior of 2.6 6 days ago.  Albumin low at 2.5, stable from priors of 2.1 and 2.0, creatinine within normal limits at 0.85.  Ethanol level negative.  Lipase within normal limits at 44. UA with moderate bilirubin, 30 protein, large hemoglobin, positive nitrates, negative leukocytes. INR 2.   MELD 23. CT abdomen pelvis shows cirrhosis, abdominal pelvic ascites, gallstones with questionable small stones in CBD, no intra or extrahepatic biliary dilatation.  Possible colitis versus portal colopathy.  Possible cystitis.  Bilirubin  is increased without other transaminitis, has possible stones in CBD on CT as well as ascites.  Patient started on Zosyn for prophylaxis as well as for possible UTI, touched base with GI for plans for possible ERCP.  Patient admitted to family medicine for further management.  Patient ultimately did develop fever in the emergency department to 101.2.  Zosyn has already been initiated, he remains hemodynamically stable.  I performed a bedside ultrasound, which does show fluid pockets however there are no large areas without close proximity to bowel that I feel confident in performing bedside paracentesis at this time as patient is already in the process of being scheduled with IR by inpatient team.  Inpatient team aware of fever.        Final Clinical Impression(s) / ED Diagnoses Final diagnoses:  None    Rx / DC Orders ED Discharge Orders     None         Renard Matter, MD 11/15/21 3557    Gareth Morgan, MD 11/16/21 1233

## 2021-11-15 NOTE — Hospital Course (Addendum)
Mathew [name] [name] is a 41 y.o. male who presented with diffuse abdominal pain and found to have SBP. PMH is significant for alcoholic cirrhosis.   Spontaneous bacterial peritonitis Presented with abdominal pain and swelling on exam with fever to 101.2 and tachycardia. CTAP notable for gallstones and questionable stone in CBD with moderate ascites. He was started on Zosyn, rifaximin, thiamine, folic acid, and lactulose. 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 No anticoagulation was recommended for thrombus, just supportive care. IR paracentesis revealed ascitic fluid with >250 PMNs; Zosyn was continued. By discharge, he was pain free and started on cipro for SBP prophylaxis ***.   Chronic conditions Anemia - holding iron supplement due to infection concern Thrombocytopenia - trend with CBC, likely 2/2 hepatic dysfunction  Issues for follow up: Ensure cipro is obtained and taken

## 2021-11-15 NOTE — Assessment & Plan Note (Deleted)
Noted moderate ascites on CT abdomen/pelvis. Will attempt to get diagnostic paracentesis. - GI consult, appreciate recs - Last BM today. Holding lactulose given NPO status. - AM PT-INR, CBC, and CMET - IR paracentesis ordered

## 2021-11-15 NOTE — Progress Notes (Signed)
Pharmacy Antibiotic Note  Mathew [name] is a 41 y.o. male admitted on 11/15/2021 with  intra-abdominal infection .  Pharmacy has been consulted for Zosyn dosing.  Patient presents with 1 day of worsening abdominal pain, febrile to 101.2, no leukocytosis. Has a history of alcoholic cirrhosis. CT scan shows multiple gallstones. GI has been consulted for possible MRCP. Team would like to start antibiotics for possible intra-abdominal infection with plans for a paracentesis tomorrow.   Plan: Start Zosyn 3.375g IV q8 hours Follow-up paracentesis, plans for GI intervention, and culture data Monitor clinical status, renal function, and LOT   Temp (24hrs), Avg:98.7 F (37.1 C), Min:98.5 F (36.9 C), Max:98.9 F (37.2 C)  Recent Labs  Lab 11/09/21 0401 11/11/21 1221 11/15/21 0831  WBC  --  4.0 7.0  CREATININE 0.70 0.69* 0.85    Estimated Creatinine Clearance: 98.6 mL/min (by C-G formula based on SCr of 0.85 mg/dL).    No Known Allergies  Antimicrobials this admission: Zosyn 11/6 >>   Dose adjustments this admission: N/A  Microbiology results:  Thank you for allowing pharmacy to be a part of this patient's care.  Louanne Belton, PharmD, [facility] PGY1 Pharmacy Resident 11/15/2021 10:35 PM

## 2021-11-15 NOTE — Assessment & Plan Note (Addendum)
VSS. Mild abdominal pain present on exam. 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 Ascitic fluid analyses with >250 PMNs qualifying for SBP. - Switched to IV rocephin yesterday; can consider switching to cipro to finish treatment and for prophylaxis - GI signed off - Tylenol prn for pain and fever - AM CBC, CMP, PT-INR

## 2021-11-16 ENCOUNTER — Other Ambulatory Visit: Payer: Self-pay

## 2021-11-16 ENCOUNTER — Inpatient Hospital Stay (HOSPITAL_COMMUNITY): Payer: Self-pay

## 2021-11-16 DIAGNOSIS — R1011 Right upper quadrant pain: Secondary | ICD-10-CM

## 2021-11-16 DIAGNOSIS — R188 Other ascites: Secondary | ICD-10-CM

## 2021-11-16 DIAGNOSIS — R109 Unspecified abdominal pain: Secondary | ICD-10-CM

## 2021-11-16 DIAGNOSIS — K7460 Unspecified cirrhosis of liver: Secondary | ICD-10-CM

## 2021-11-16 DIAGNOSIS — Z5989 Other problems related to housing and economic circumstances: Secondary | ICD-10-CM

## 2021-11-16 HISTORY — PX: IR PARACENTESIS: IMG2679

## 2021-11-16 LAB — CBC
HCT: 28.4 % — ABNORMAL LOW (ref 39.0–52.0)
Hemoglobin: 9.9 g/dL — ABNORMAL LOW (ref 13.0–17.0)
MCH: 34.9 pg — ABNORMAL HIGH (ref 26.0–34.0)
MCHC: 34.9 g/dL (ref 30.0–36.0)
MCV: 100 fL (ref 80.0–100.0)
Platelets: 38 10*3/uL — ABNORMAL LOW (ref 150–400)
RBC: 2.84 MIL/uL — ABNORMAL LOW (ref 4.22–5.81)
RDW: 15.2 % (ref 11.5–15.5)
WBC: 5.4 10*3/uL (ref 4.0–10.5)
nRBC: 0 % (ref 0.0–0.2)

## 2021-11-16 LAB — COMPREHENSIVE METABOLIC PANEL
ALT: 26 U/L (ref 0–44)
AST: 26 U/L (ref 15–41)
Albumin: 2.1 g/dL — ABNORMAL LOW (ref 3.5–5.0)
Alkaline Phosphatase: 113 U/L (ref 38–126)
Anion gap: 6 (ref 5–15)
BUN: 11 mg/dL (ref 6–20)
CO2: 21 mmol/L — ABNORMAL LOW (ref 22–32)
Calcium: 7.9 mg/dL — ABNORMAL LOW (ref 8.9–10.3)
Chloride: 103 mmol/L (ref 98–111)
Creatinine, Ser: 0.65 mg/dL (ref 0.61–1.24)
GFR, Estimated: >60 mL/min (ref >60)
Glucose, Bld: 107 mg/dL — ABNORMAL HIGH (ref 70–99)
Potassium: 2.9 mmol/L — ABNORMAL LOW (ref 3.5–5.1)
Sodium: 130 mmol/L — ABNORMAL LOW (ref 135–145)
Total Bilirubin: 4 mg/dL — ABNORMAL HIGH (ref 0.3–1.2)
Total Protein: 5.2 g/dL — ABNORMAL LOW (ref 6.5–8.1)

## 2021-11-16 LAB — BASIC METABOLIC PANEL
Anion gap: 6 (ref 5–15)
BUN: 9 mg/dL (ref 6–20)
CO2: 21 mmol/L — ABNORMAL LOW (ref 22–32)
Calcium: 7.9 mg/dL — ABNORMAL LOW (ref 8.9–10.3)
Chloride: 108 mmol/L (ref 98–111)
Creatinine, Ser: 0.61 mg/dL (ref 0.61–1.24)
GFR, Estimated: >60 mL/min (ref >60)
Glucose, Bld: 108 mg/dL — ABNORMAL HIGH (ref 70–99)
Potassium: 3.3 mmol/L — ABNORMAL LOW (ref 3.5–5.1)
Sodium: 135 mmol/L (ref 135–145)

## 2021-11-16 LAB — GRAM STAIN

## 2021-11-16 LAB — BODY FLUID CELL COUNT WITH DIFFERENTIAL
Eos, Fluid: 0 %
Lymphs, Fluid: 3 %
Monocyte-Macrophage-Serous Fluid: 16 % — ABNORMAL LOW (ref 50–90)
Neutrophil Count, Fluid: 81 % — ABNORMAL HIGH (ref 0–25)
Total Nucleated Cell Count, Fluid: 913 cu mm (ref 0–1000)

## 2021-11-16 LAB — PROTIME-INR
INR: 2.1 — ABNORMAL HIGH (ref 0.8–1.2)
Prothrombin Time: 23.1 seconds — ABNORMAL HIGH (ref 11.4–15.2)

## 2021-11-16 LAB — LACTATE DEHYDROGENASE, PLEURAL OR PERITONEAL FLUID: LD, Fluid: 26 U/L — ABNORMAL HIGH (ref 3–23)

## 2021-11-16 LAB — MAGNESIUM: Magnesium: 1.6 mg/dL — ABNORMAL LOW (ref 1.7–2.4)

## 2021-11-16 LAB — PROTEIN, PLEURAL OR PERITONEAL FLUID: Total protein, fluid: <3 g/dL

## 2021-11-16 MED ORDER — GADOBUTROL 1 MMOL/ML IV SOLN
7.0000 mL | Freq: Once | INTRAVENOUS | Status: AC | PRN
  Administered 2021-11-16: 7 mL via INTRAVENOUS

## 2021-11-16 MED ORDER — POTASSIUM CHLORIDE CRYS ER 20 MEQ PO TBCR: 40.0000 meq | EXTENDED_RELEASE_TABLET | Freq: Four times a day (QID) | ORAL | Status: DC

## 2021-11-16 MED ORDER — POTASSIUM CHLORIDE CRYS ER 20 MEQ PO TBCR
40.0000 meq | EXTENDED_RELEASE_TABLET | Freq: Two times a day (BID) | ORAL | Status: AC
  Administered 2021-11-16 (×2): 40 meq via ORAL
  Filled 2021-11-16 (×2): qty 2

## 2021-11-16 MED ORDER — POTASSIUM CHLORIDE 20 MEQ PO PACK: 40.0000 meq | PACK | Freq: Once | ORAL | Status: DC

## 2021-11-16 MED ORDER — MAGNESIUM SULFATE 2 GM/50ML IV SOLN
2.0000 g | Freq: Once | INTRAVENOUS | Status: AC
  Administered 2021-11-16: 2 g via INTRAVENOUS
  Filled 2021-11-16: qty 50

## 2021-11-16 MED ORDER — LIDOCAINE HCL 1 % IJ SOLN
INTRAMUSCULAR | Status: AC
  Filled 2021-11-16: qty 20

## 2021-11-16 NOTE — Progress Notes (Signed)
Daily Progress Note Intern Pager: [phone]  Patient name: Mathew [name] [name] Medical record number: [id number] Date of birth: 12/08/1980 Age: 41 y.o. Gender: male  Primary Care Provider: [name], MD Consultants: GI Code Status: Full  Pt Overview and Major Events to Date:  11/6 - admitted  Assessment and Plan:  [name] [name] is a 41 y.o. male who presented with diffuse abdominal pain and swelling. Pertinent PMH/PSH includes alcoholic cirrhosis.   * Abdominal pain VSS. Mild abdominal pain present on exam. 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 Suspect pain could also be due to transient cholelithiasis. - Continue Zosyn - GI following, appreciate recs for mural thrombus - Tylenol prn for pain and fever - AM CBC, CMP, PT-INR  Alcoholic cirrhosis of liver with ascites (HCC) Moderate ascites on CT abdomen/pelvis. Given he had a fever and WBC, presumed SBP at this time and on Zosyn. IR consulted for paracentesis. He is on 4 week taper of prednisone from last admission; appreciate GI recommendations for continuing this with current presentation. - GI and IR consulted, appreciate recs - Last BM today. Holding lactulose given NPO status - AM PT-INR, CBC, and CMET - F/u IR paracentesis and resultant fluid studies - Continue rifaximin, thiamine, folic acid, lactulose - Restart cipro at discharge for SBP prophylaxis  Hypokalemia K 2.9 on admission. K 40 mEq BID ordered for repletion. - PM BMP today to assess improvement - AM CMP as above  Hypomagnesemia Mg 1.6 this morning. S/p 2g Mg. -F/u AM Mg   FEN/GI: NPO for potential MRCP PPx: SCDs given prolonged INR and thrombocytopenia Dispo:Pending clinical improvement  Subjective:  Doing well this morning. Feels he is back at "100%."  Objective: Temp:  [98.4 F (36.9 C)-101.2 F (38.4 C)] 98.4 F (36.9 C) (11/07 1249) Pulse Rate:  [82-111] 84 (11/07  1249) Resp:  [17-20] 17 (11/07 1249) BP: (119-146)/(69-89) 129/78 (11/07 1249) SpO2:  [98 %-100 %] 99 % (11/07 1249) Weight:  [77.1 kg] 77.1 kg (11/07 7628) Physical Exam: General: Alert and oriented, in NAD Skin: Warm, dry HEENT: NCAT, EOM grossly normal, midline nasal septum Cardiac: RRR, no m/r/g appreciated Respiratory: CTAB anteriorly, breathing and speaking comfortably on RA Abdominal: Soft, mildly tender to palpation in RUQ, nondistended, normoactive bowel sounds Extremities: Moves all extremities grossly equally in bed Neurological: No gross focal deficit Psychiatric: Appropriate mood and affect   Laboratory: Most recent CBC Lab Results  Component Value Date   WBC 5.4 11/16/2021   HGB 9.9 (L) 11/16/2021   HCT 28.4 (L) 11/16/2021   MCV 100.0 11/16/2021   PLT 38 (L) 11/16/2021   Most recent BMP    Latest Ref Rng & Units 11/16/2021    3:55 AM  BMP  Glucose 70 - 99 mg/dL 315   BUN 6 - 20 mg/dL 11   Creatinine 1.76 - 1.24 mg/dL 1.60   Sodium 737 - 106 mmol/L 130   Potassium 3.5 - 5.1 mmol/L 2.9   Chloride 98 - 111 mmol/L 103   CO2 22 - 32 mmol/L 21   Calcium 8.9 - 10.3 mg/dL 7.9    PT 26.9 INR 2.1  Imaging/Diagnostic Tests: IMPRESSION: 1. Liver cirrhosis. Interim development of at least moderate volume abdominopelvic ascites. Splenomegaly. 2. Contracted gallbladder with multiple stones. Questionable small stones within the common bile duct. No intra or extrahepatic biliary dilatation. 3. Fluid in the colon. Possible wall thickening of the right and proximal transverse  colon which may be secondary to colitis or portal colopathy. 4. Interval small left effusion with passive atelectasis at the left base. 5. Slightly thick-walled appearance of the urinary bladder, correlate for cystitis.  Mathew Hal, MD 11/16/2021, 12:59 PM PGY-1, [place] Intern pager: [phone], text pages welcome Secure chat group [place]

## 2021-11-16 NOTE — Procedures (Signed)
PROCEDURE SUMMARY:  Successful ultrasound guided paracentesis from the right lower  quadrant.  Yielded 800 ml of straw fluid.  No immediate complications.  The patient tolerated the procedure well.   Specimen was sent for labs.  EBL < 72mL  If the patient eventually requires >/=2 paracenteses in a 30 day period, screening evaluation by the [facility] will be assessed.

## 2021-11-16 NOTE — Consult Note (Addendum)
Referring Provider: McDiarmid, Leighton Roach, MD Primary Care Physician:  [name], MD Primary Gastroenterologist:  Enid Baas, Dr. Bosie Clos  Reason for Consultation:  Cirrhosis, abdominal pain, hyperbilirubinemia, abnormal CT A/P  HPI: Mathew [name] is a 41 y.o. male with a history of alcohol use disorder, alcohol induced cirrhosis, who presented to the ED 11/15/21 for abdominal pain.   Was admitted 09/01/2021 for similar presentation of generalized abdominal pain for 1 month with nausea, vomiting, diarrhea.  At that time patient diagnosed with acute on chronic alcoholic hepatitis, cirrhosis with ascites and without SBP, confirmed by paracentesis.  Discharged on Cipro for SBP prophylaxis.  Patient recently admitted from 10/27 to 11/09/2021 for hepatic encephalopathy.  Was placed on lactulose along with Xifaxan, ammonia trending downward.  Discriminant factor 42, so was started on prednisolone for total of 4 weeks.  Did not have paracentesis at that time as there was not enough ascitic fluid to be tapped.  Was started on Cipro for SBP prophylaxis, mentation returned to baseline and he was discharged.  Home medications include Xifaxan 550 mg twice daily, spironolactone 100 mg daily, was on prednisone taper beginning 11/09/2021, on lactulose 45 mL every 6 hours, Lasix 40 mg daily, oral iron, Protonix.  In the ED: CBC shows no leukocytosis, hemoglobin 11, up from 10.6 five days ago, platelets 49, chronic thrombocytopenia.  Bilirubin elevated at 5.1, acute increase from most recent prior of 2.6.  Normal lipase.  CT A/P shows cirrhosis, abdominal pelvic ascites, gallstones with questionable small stones in the CBD, no intra or extrahepatic biliary dilatation.  Possible colitis versus portal colopathy.  Possible cystitis.  Developed fever to Tmax 101.2 in the ED. On Zosyn.  GI consulted for consideration of ERCP.  IR paracentesis has been ordered.  MRCP has not been ordered.  Patient seen  and examined at bedside this morning.  States that he developed abdominal pain and bloating 2 days ago which persisted and prompted him to come to the ER.  He denies any nausea, vomiting, fever, chills.  Abdominal pain is generalized.  Denies any change in bowel movements.  Last bowel movement today and reportedly normal per patient.  Denies any melena or hematochezia.  He has noticed yellowing of his skin, has not paid attention to his eyes.  Unsure when this change was first noticeable.  Taking all medications as prescribed.  States his last drink of alcohol was 2 and half months ago.  Reports regular bowel movements and denies any constipation or diarrhea but is unsure how often he has a bowel movement.  Prior GI history: Last paracentesis 09/02/2021 yielded 2 L of peritoneal fluid. [place] ascites 11/06/2021 showed small amount of ascites not amenable to paracentesis.  EGD 03/17/2018 showed grade 2 esophageal varices, 2 cm hiatal hernia, portal hypertensive gastropathy, friable gastric mucosa  Colonoscopy 03/17/2018 showed friability with contact bleeding in the entire colon, otherwise normal, 10-year recall or consider alternative screening option  Past Medical History:  Diagnosis Date   Alcohol withdrawal seizure (HCC)    Cirrhosis (HCC)    ETOH abuse    Pneumonia 04/02/2020   SBP (spontaneous bacterial peritonitis) (HCC) 06/17/2021    Past Surgical History:  Procedure Laterality Date   COLONOSCOPY WITH PROPOFOL N/A 03/17/2018   Procedure: COLONOSCOPY WITH PROPOFOL;  Surgeon: Bernette Redbird, MD;  Location: [facility] ENDOSCOPY;  Service: Endoscopy;  Laterality: N/A;   ESOPHAGOGASTRODUODENOSCOPY (EGD) WITH PROPOFOL N/A 03/17/2018   Procedure: ESOPHAGOGASTRODUODENOSCOPY (EGD) WITH PROPOFOL;  Surgeon: Bernette Redbird, MD;  Location: [facility] ENDOSCOPY;  Service: Endoscopy;  Laterality: N/A;   IR PARACENTESIS  03/14/2018   IR PARACENTESIS  11/18/2020   IR PARACENTESIS  06/17/2021   IR PARACENTESIS  08/30/2021   IR  PARACENTESIS  09/02/2021    Prior to Admission medications   Medication Sig Start Date End Date Taking? Authorizing Provider  ferrous sulfate 325 (65 FE) MG tablet Take 1 tablet (325 mg total) by mouth daily with breakfast. 08/25/21   [name], MD  folic acid (FOLVITE) 1 MG tablet Take 1 tablet (1 mg total) by mouth daily. 08/25/21 08/25/22  [name], MD  furosemide (LASIX) 40 MG tablet Take 1 tablet (40 mg total) by mouth daily. 11/11/21   [name], MD  lactulose (CHRONULAC) 10 GM/15ML solution Take 45 mLs (30 g total) by mouth every 6 (six) hours. 11/09/21   Thurnell Lose, MD  Multiple Vitamin (MULTIVITAMIN WITH MINERALS) TABS tablet Take 1 tablet by mouth daily. 08/25/21   [name], MD  nicotine (NICODERM CQ - DOSED IN MG/24 HOURS) 14 mg/24hr patch Place 1 patch (14 mg total) onto the skin daily. 11/09/21   Thurnell Lose, MD  pantoprazole (PROTONIX) 40 MG tablet Take 1 tablet (40 mg total) by mouth daily at 6 (six) AM. 11/11/21   [name], MD  predniSONE (DELTASONE) 5 MG tablet Take 8 tabs for 2 weeks then 5 tabs for 3 days, then 3 tabs for 3 days, then 2 tabs for 3 days then 1 tab for 3 days, finally half a tab for 3 days and stop. 11/09/21   Thurnell Lose, MD  propranolol (INDERAL) 10 MG tablet Take 1 tablet (10 mg total) by mouth 2 (two) times daily. 11/11/21   [name], MD  rifaximin (XIFAXAN) 550 MG TABS tablet Take 1 tablet (550 mg total) by mouth 2 (two) times daily. 11/09/21   Thurnell Lose, MD  spironolactone (ALDACTONE) 100 MG tablet Take 1 tablet (100 mg total) by mouth daily. 11/11/21   [name], MD  thiamine (VITAMIN B1) 100 MG tablet Take 1 tablet (100 mg total) by mouth daily. 08/25/21   [name], MD  Vitamin D, Ergocalciferol, (DRISDOL) 1.25 MG (50000 UNIT) CAPS capsule Take 1 capsule (50,000 Units total) by mouth every Wednesday. Patient taking differently: Take 50,000 Units by mouth every 7 (seven)  days. 07/14/21   [name], MD    Scheduled Meds:  folic acid  1 mg Oral Daily   lactulose  30 g Oral Q6H   potassium chloride  40 mEq Oral BID   rifaximin  550 mg Oral BID   thiamine  100 mg Oral Daily   Continuous Infusions:  piperacillin-tazobactam (ZOSYN)  IV Stopped (11/16/21 0750)   PRN Meds:.acetaminophen **OR** acetaminophen  Allergies as of 11/15/2021   (No Known Allergies)    Family History  Adopted: Yes  Problem Relation Age of Onset   Cancer Neg Hx    Heart disease Neg Hx     Social History   Socioeconomic History   Marital status: Significant Other    Spouse name: Not on file   Number of children: Not on file   Years of education: Not on file   Highest education level: Not on file  Occupational History   Not on file  Tobacco Use   Smoking status: Every Day    Packs/day: 0.25    Types: Cigarettes    Start date: 08/11/2019  Smokeless tobacco: Never  Vaping Use   Vaping Use: Never used  Substance and Sexual Activity   Alcohol use: Yes    Alcohol/week: 2.0 standard drinks of alcohol    Types: 2 Cans of beer per week    Comment: 3-6 beer cans a week   Drug use: No   Sexual activity: Not Currently  Other Topics Concern   Not on file  Social History Narrative   Not on file   Social Determinants of Health   Financial Resource Strain: Not on file  Food Insecurity: Not on file  Transportation Needs: Not on file  Physical Activity: Not on file  Stress: Not on file  Social Connections: Not on file  Intimate Partner Violence: Not on file    Review of Systems: Review of Systems  Constitutional:  Negative for chills and fever.  HENT:  Negative for sore throat.   Eyes:  Negative for discharge and redness.  Respiratory:  Negative for shortness of breath, wheezing and stridor.   Cardiovascular:  Negative for chest pain and palpitations.  Gastrointestinal:  Positive for abdominal pain. Negative for blood in stool, constipation, diarrhea,  heartburn, melena, nausea and vomiting.  Genitourinary:  Negative for dysuria and urgency.  Musculoskeletal:  Negative for falls and joint pain.  Skin:  Negative for itching and rash.  Neurological:  Negative for seizures and weakness.  Endo/Heme/Allergies:  Negative for environmental allergies and polydipsia.  Psychiatric/Behavioral:  The patient is not nervous/anxious and does not have insomnia.      Physical Exam: Physical Exam Vitals reviewed.  Constitutional:      General: He is not in acute distress.    Appearance: He is ill-appearing.  HENT:     Head: Normocephalic and atraumatic.     Right Ear: External ear normal.     Left Ear: External ear normal.     Nose: Nose normal.     Mouth/Throat:     Mouth: Mucous membranes are moist.     Pharynx: Oropharynx is clear.  Eyes:     General: Scleral icterus present.     Extraocular Movements: Extraocular movements intact.  Cardiovascular:     Rate and Rhythm: Normal rate and regular rhythm.     Pulses: Normal pulses.     Heart sounds: Normal heart sounds.  Pulmonary:     Effort: Pulmonary effort is normal.     Breath sounds: Normal breath sounds.  Abdominal:     General: Bowel sounds are normal. There is distension.     Palpations: Abdomen is soft.     Tenderness: There is abdominal tenderness. There is no guarding or rebound.  Musculoskeletal:     Cervical back: Normal range of motion and neck supple.     Right lower leg: No edema.     Left lower leg: No edema.  Skin:    General: Skin is warm and dry.     Coloration: Skin is jaundiced.  Neurological:     General: No focal deficit present.     Mental Status: He is alert and oriented to person, place, and time.  Psychiatric:        Mood and Affect: Mood normal.        Behavior: Behavior normal.     Vital signs: Vitals:   11/16/21 0623 11/16/21 0659  BP: (!) 145/80   Pulse: 93   Resp: 17   Temp:  98.9 F (37.2 C)  SpO2: 98%    Last BM Date : 11/16/21  GI:   Lab Results: Recent Labs    11/15/21 0831 11/16/21 0355  WBC 7.0 5.4  HGB 11.0* 9.9*  HCT 31.7* 28.4*  PLT 49* 38*   BMET Recent Labs    11/15/21 0831 11/16/21 0355  NA 132* 130*  K 3.1* 2.9*  CL 104 103  CO2 21* 21*  GLUCOSE 168* 107*  BUN 10 11  CREATININE 0.85 0.65  CALCIUM 8.3* 7.9*   LFT Recent Labs    11/16/21 0355  PROT 5.2*  ALBUMIN 2.1*  AST 26  ALT 26  ALKPHOS 113  BILITOT 4.0*   PT/INR Recent Labs    11/15/21 1700 11/16/21 0355  LABPROT 22.2* 23.1*  INR 2.0* 2.1*     Studies/Results: CT ABDOMEN PELVIS W CONTRAST  Result Date: 11/15/2021 CLINICAL DATA:  Flank pain elevated bilirubin EXAM: CT ABDOMEN AND PELVIS WITH CONTRAST TECHNIQUE: Multidetector CT imaging of the abdomen and pelvis was performed using the standard protocol following bolus administration of intravenous contrast. RADIATION DOSE REDUCTION: This exam was performed according to the departmental dose-optimization program which includes automated exposure control, adjustment of the mA and/or kV according to patient size and/or use of iterative reconstruction technique. CONTRAST:  9mL OMNIPAQUE IOHEXOL 350 MG/ML SOLN COMPARISON:  CT 11/05/2021, 09/01/2021 FINDINGS: Lower chest: Lung bases demonstrate interval small left effusion with passive atelectasis at the left base. Hepatobiliary: Liver cirrhosis. Contracted gallbladder with multiple stones. No intra hepatic or extrahepatic biliary dilatation. Questionable small ductal stones, coronal series 6 image 59 Pancreas: Unremarkable. No pancreatic ductal dilatation or surrounding inflammatory changes. Spleen: Enlarged, craniocaudal measurement of 16.2 cm. Adrenals/Urinary Tract: Adrenal glands are normal. Kidneys show no hydronephrosis. Slightly thick-walled urinary bladder Stomach/Bowel: The stomach is moderately enlarged. There is no dilated small bowel. Fluid in the colon. There is possible wall thickening of the right and proximal transverse  colon. Vascular/Lymphatic: Nonaneurysmal aorta.  No suspicious lymph node Reproductive: Prostate is unremarkable. Other: No free air. Interim development of at least moderate volume abdominopelvic ascites. Small umbilical region hernia containing fluid. Musculoskeletal: No acute osseous abnormality IMPRESSION: 1. Liver cirrhosis. Interim development of at least moderate volume abdominopelvic ascites. Splenomegaly. 2. Contracted gallbladder with multiple stones. Questionable small stones within the common bile duct. No intra or extrahepatic biliary dilatation. 3. Fluid in the colon. Possible wall thickening of the right and proximal transverse colon which may be secondary to colitis or portal colopathy. 4. Interval small left effusion with passive atelectasis at the left base. 5. Slightly thick-walled appearance of the urinary bladder, correlate for cystitis. Electronically Signed   By: Jasmine Pang M.D.   On: 11/15/2021 19:24    Impression: Abdominal pain - Onset 11/14/2021, generalized - Labs on presentation significant for total bilirubin 5.1, normal LFTs, normal lipase, normal BUN and creatinine. - Hemoglobin stable around baseline, 11.0 on presentation - No leukocytosis but did have fever up to 101.2 in the ED - CT A/P 11/15/2021 shows cirrhosis with moderate volume ascites.  Contracted gallbladder with multiple stones, questionable small stones within the CBD, no intra or extrahepatic biliary dilatation.  Possible colitis versus portal colopathy. - Started on Zosyn  Alcoholic cirrhosis of liver with ascites - Last drink of alcohol 2.5 months ago - IR paracentesis has been ordered - Currently receiving lactulose and Xifaxan, unclear whether patient has been taking lasix or spironolactone though they are in his med list.  MELD 3.0: 25 at 11/16/2021  3:55 AM MELD-Na: 25 at 11/16/2021  3:55 AM Calculated from: Serum Creatinine: 0.65  mg/dL (Using min of 1 mg/dL) at 37/04/8268  7:86 AM Serum Sodium:  130 mmol/L at 11/16/2021  3:55 AM Total Bilirubin: 4.0 mg/dL at 75/04/4918  1:00 AM Serum Albumin: 2.1 g/dL at 71/02/1973  8:83 AM INR(ratio): 2.1 at 11/16/2021  3:55 AM Age at listing (hypothetical): 44 years Sex: Male at 11/16/2021  3:55 AM    Plan: Patient with hyperbilirubinemia, normal LFTs, and generalized abdominal pain ongoing for 3 days. CT A/P notable for gallstones and questionable CBD stone. Will order MRCP for further evaluation and if positive can discuss ERCP. Agree with IR paracentesis. Continue Xifaxan, lactulose, Zosyn.  Continue supportive care.  GI will follow.    LOS: 1 day   Berdine Dance  PA-C 11/16/2021, 8:04 AM  Contact #  [phone]

## 2021-11-16 NOTE — Assessment & Plan Note (Addendum)
K 3.4; repleted with 20 mEq. - AM CMP as above

## 2021-11-16 NOTE — Assessment & Plan Note (Addendum)
Mg stable at 1.7. -F/u AM Mg

## 2021-11-17 DIAGNOSIS — K652 Spontaneous bacterial peritonitis: Secondary | ICD-10-CM

## 2021-11-17 DIAGNOSIS — E876 Hypokalemia: Secondary | ICD-10-CM

## 2021-11-17 DIAGNOSIS — R109 Unspecified abdominal pain: Secondary | ICD-10-CM

## 2021-11-17 LAB — COMPREHENSIVE METABOLIC PANEL
ALT: 21 U/L (ref 0–44)
AST: 27 U/L (ref 15–41)
Albumin: 2 g/dL — ABNORMAL LOW (ref 3.5–5.0)
Alkaline Phosphatase: 69 U/L (ref 38–126)
Anion gap: 5 (ref 5–15)
BUN: 10 mg/dL (ref 6–20)
CO2: 20 mmol/L — ABNORMAL LOW (ref 22–32)
Calcium: 7.7 mg/dL — ABNORMAL LOW (ref 8.9–10.3)
Chloride: 110 mmol/L (ref 98–111)
Creatinine, Ser: 0.64 mg/dL (ref 0.61–1.24)
GFR, Estimated: >60 mL/min (ref >60)
Glucose, Bld: 112 mg/dL — ABNORMAL HIGH (ref 70–99)
Potassium: 3.4 mmol/L — ABNORMAL LOW (ref 3.5–5.1)
Sodium: 135 mmol/L (ref 135–145)
Total Bilirubin: 3.6 mg/dL — ABNORMAL HIGH (ref 0.3–1.2)
Total Protein: 4.9 g/dL — ABNORMAL LOW (ref 6.5–8.1)

## 2021-11-17 LAB — CBC
HCT: 26.6 % — ABNORMAL LOW (ref 39.0–52.0)
Hemoglobin: 9.2 g/dL — ABNORMAL LOW (ref 13.0–17.0)
MCH: 34.7 pg — ABNORMAL HIGH (ref 26.0–34.0)
MCHC: 34.6 g/dL (ref 30.0–36.0)
MCV: 100.4 fL — ABNORMAL HIGH (ref 80.0–100.0)
Platelets: 38 10*3/uL — ABNORMAL LOW (ref 150–400)
RBC: 2.65 MIL/uL — ABNORMAL LOW (ref 4.22–5.81)
RDW: 15 % (ref 11.5–15.5)
WBC: 3.7 10*3/uL — ABNORMAL LOW (ref 4.0–10.5)
nRBC: 0 % (ref 0.0–0.2)

## 2021-11-17 LAB — MAGNESIUM: Magnesium: 1.7 mg/dL (ref 1.7–2.4)

## 2021-11-17 MED ORDER — SODIUM CHLORIDE 0.9 % IV SOLN
2.0000 g | INTRAVENOUS | Status: DC
  Administered 2021-11-17: 2 g via INTRAVENOUS
  Filled 2021-11-17: qty 20

## 2021-11-17 MED ORDER — POTASSIUM CHLORIDE 20 MEQ PO PACK
20.0000 meq | PACK | Freq: Once | ORAL | Status: AC
  Administered 2021-11-17: 20 meq via ORAL
  Filled 2021-11-17: qty 1

## 2021-11-17 NOTE — Progress Notes (Signed)
Daily Progress Note Intern Pager: [phone]  Patient name: Mathew [name] Mathew [name] Medical record number: [id number] Date of birth: [birth year] Age: 41 y.o. Gender: male  Primary Care Provider: [name], MD Consultants: GI Code Status: Full   Pt Overview and Major Events to Date:  11/6 - admitted   Assessment and Plan:   Mathew [name] Mathew Dixon Mathew [name] is a 41 y.o. male who presented with diffuse abdominal pain and swelling. Pertinent PMH/PSH includes alcoholic cirrhosis.   * Spontaneous bacterial peritonitis (HCC) VSS. Mild abdominal pain present on exam. 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 Ascitic fluid analyses with >250 PMNs qualifying for SBP. - Continue Zosyn for treatment of SBP for now; will consult GI for further recs and potentially switching to IV rocephin - GI following, appreciate recs - Tylenol prn for pain and fever - AM CBC, CMP, PT-INR  Alcoholic cirrhosis of liver with ascites (HCC) Moderate ascites on CT abdomen/pelvis. IR tap with ascitic fluid >250 PMNs indicating SBP. Culture and cytology still in process. - GI following, appreciate recs - F/u ascitic fluid culture and cytology - Restarted lactulose - AM PT-INR, CBC, and CMET - Continue rifaximin, thiamine, folic acid, lactulose - Restart cipro at discharge for SBP prophylaxis  Hypokalemia K 3.4; repleted with 20 mEq. - AM CMP as above  Hypomagnesemia Mg stable at 1.7. -F/u AM Mg   FEN/GI: NPO for potential MRCP PPx: SCDs given prolonged INR and thrombocytopenia Dispo:Home pending abx   Subjective:  Doing well this morning. Denies any pain or concerns. He wants to make sure he has a list of what he can and cannot eat when he leaves the hospital.  Objective: Temp:  [97.8 F (36.6 C)-99.9 F (37.7 C)] 98.4 F (36.9 C) (11/08 1131) Pulse Rate:  [73-106] 82  (11/08 1131) Resp:  [16-18] 16 (11/08 1131) BP: (108-140)/(60-90) 130/85 (11/08 1131) SpO2:  [94 %-100 %] 100 % (11/08 1131) Physical Exam: General: Alert and oriented, in NAD Skin: Warm, dry, and intact without lesions HEENT: NCAT, EOM grossly normal, midline nasal septum, slightly jaundiced eyes Cardiac: RRR, no m/r/g appreciated Respiratory: CTAB anteriorly, breathing and speaking comfortably on RA Abdominal: Soft, mildly TTP, nondistended, normoactive bowel sounds Extremities: Moves all extremities grossly equally in bed Neurological: No gross focal deficit Psychiatric: Appropriate mood and affect   Laboratory: Most recent CBC Lab Results  Component Value Date   WBC 3.7 (L) 11/17/2021   HGB 9.2 (L) 11/17/2021   HCT 26.6 (L) 11/17/2021   MCV 100.4 (H) 11/17/2021   PLT 38 (L) 11/17/2021   Most recent BMP    Latest Ref Rng & Units 11/17/2021    3:56 AM  BMP  Glucose 70 - 99 mg/dL 945   BUN 6 - 20 mg/dL 10   Creatinine 8.59 - 1.24 mg/dL 2.92   Sodium 446 - 286 mmol/L 135   Potassium 3.5 - 5.1 mmol/L 3.4   Chloride 98 - 111 mmol/L 110   CO2 22 - 32 mmol/L 20   Calcium 8.9 - 10.3 mg/dL 7.7    Ascitic fluid with >250 PMNs  Imaging/Diagnostic Tests:  IR paracentesis Successful ultrasound guided paracentesis from the right lower  quadrant.  Yielded 800 ml of straw fluid.  No immediate complications.  The patient tolerated the procedure well.   Janeal Holmes, MD 11/17/2021, 12:08 PM PGY-1, [facility]  [place] Intern pager: [phone], text pages welcome Secure chat group [place]

## 2021-11-17 NOTE — Progress Notes (Signed)
[facility] Gastroenterology Progress Note  Mathew [name] 41 y.o. Jul 19, 1980  Subjective: Patient seen and examined at bedside.  Interpretation service used: Desiree #[id number].  Patient reports he is doing well.  Denies any abdominal pain.  Has been having bowel movements and denies melena or hematochezia.  Denies nausea, vomiting, fever, chills.   On Zosyn.  ROS : Review of Systems  Constitutional:  Negative for chills and fever.  Gastrointestinal:  Negative for abdominal pain, blood in stool, constipation, diarrhea, heartburn, melena, nausea and vomiting.      Objective: Vital signs in last 24 hours: Vitals:   11/17/21 1009 11/17/21 1131  BP: 123/76 130/85  Pulse: 89 82  Resp: 16 16  Temp: 99.1 F (37.3 C) 98.4 F (36.9 C)  SpO2: 98% 100%    Physical Exam:  General:  Alert, cooperative, no distress, jaundiced   Head:  Normocephalic, without obvious abnormality, atraumatic  Eyes:  Scleral icterus, EOM's intact  Lungs:   Clear to auscultation bilaterally, respirations unlabored  Heart:  Regular rate and rhythm, S1, S2 normal  Abdomen:   Soft, distended, non-tender, bowel sounds active   Extremities: Extremities normal, atraumatic, no  edema  Pulses: 2+ and symmetric    Lab Results: Recent Labs    11/16/21 0749 11/16/21 1544 11/17/21 0356  NA  --  135 135  K  --  3.3* 3.4*  CL  --  108 110  CO2  --  21* 20*  GLUCOSE  --  108* 112*  BUN  --  9 10  CREATININE  --  0.61 0.64  CALCIUM  --  7.9* 7.7*  MG 1.6*  --  1.7   Recent Labs    11/16/21 0355 11/17/21 0356  AST 26 27  ALT 26 21  ALKPHOS 113 69  BILITOT 4.0* 3.6*  PROT 5.2* 4.9*  ALBUMIN 2.1* 2.0*   Recent Labs    11/15/21 0831 11/16/21 0355 11/17/21 0356  WBC 7.0 5.4 3.7*  NEUTROABS 4.9  --   --   HGB 11.0* 9.9* 9.2*  HCT 31.7* 28.4* 26.6*  MCV 102.3* 100.0 100.4*  PLT 49* 38* 38*   Recent Labs    11/15/21 1700 11/16/21 0355  LABPROT 22.2* 23.1*  INR 2.0* 2.1*    Assessment Abdominal  pain - Onset 11/14/2021, generalized - Labs on presentation significant for total bilirubin 5.1, normal LFTs, normal lipase, normal BUN and creatinine. - Hemoglobin stable around baseline, 11.0 on presentation - No leukocytosis but did have fever up to 101.2 in the ED - CT A/P 11/15/2021 shows cirrhosis with moderate volume ascites.  Contracted gallbladder with multiple stones, questionable small stones within the CBD, no intra or extrahepatic biliary dilatation.  Possible colitis versus portal colopathy. - MRCP 11/16/2021 negative for choledocholithiasis - On Zosyn  - Labs today show hemoglobin 9.2, no leukocytosis, platelets 38.  Normal LFTs, T bili 3.6, downtrending   Alcoholic cirrhosis of liver with ascites - Last drink of alcohol 2.5 months ago - IR paracentesis revealed SBP, on Zosyn - Currently receiving lactulose and Xifaxan, unclear whether patient has been taking lasix or spironolactone though they are in his med list.   MELD 3.0: 22 at 11/17/2021  3:56 AM MELD-Na: 21 at 11/17/2021  3:56 AM Calculated from: Serum Creatinine: 0.64 mg/dL (Using min of 1 mg/dL) at 54/02/7060  3:76 AM Serum Sodium: 135 mmol/L at 11/17/2021  3:56 AM Total Bilirubin: 3.6 mg/dL at 28/03/1515  6:16 AM Serum Albumin: 2.0 g/dL at 07/12/7104  3:56 AM INR(ratio): 2.1 at 11/16/2021  3:55 AM Age at listing (hypothetical): 53 years Sex: Male at 11/17/2021  3:56 AM   Plan: Pain improved s/p paracentesis and IV Zosyn. ERCP not indicated, no choledocholithiasis on MRCP.  Planning to switch from Zosyn to Ceftriaxone for SBP. Continue laculose, Xifaxan.  Continue supportive care. GI will follow.  Berdine Dance PA-C 11/17/2021, 3:04 PM  Contact #  [phone]

## 2021-11-18 ENCOUNTER — Other Ambulatory Visit (HOSPITAL_COMMUNITY): Payer: Self-pay

## 2021-11-18 ENCOUNTER — Other Ambulatory Visit: Payer: Self-pay

## 2021-11-18 LAB — URINE CULTURE: Culture: 20000 — AB

## 2021-11-18 LAB — COMPREHENSIVE METABOLIC PANEL
ALT: 21 U/L (ref 0–44)
AST: 24 U/L (ref 15–41)
Albumin: 2 g/dL — ABNORMAL LOW (ref 3.5–5.0)
Alkaline Phosphatase: 77 U/L (ref 38–126)
Anion gap: 5 (ref 5–15)
BUN: 8 mg/dL (ref 6–20)
CO2: 21 mmol/L — ABNORMAL LOW (ref 22–32)
Calcium: 7.8 mg/dL — ABNORMAL LOW (ref 8.9–10.3)
Chloride: 108 mmol/L (ref 98–111)
Creatinine, Ser: 0.58 mg/dL — ABNORMAL LOW (ref 0.61–1.24)
GFR, Estimated: >60 mL/min (ref >60)
Glucose, Bld: 95 mg/dL (ref 70–99)
Potassium: 3.1 mmol/L — ABNORMAL LOW (ref 3.5–5.1)
Sodium: 134 mmol/L — ABNORMAL LOW (ref 135–145)
Total Bilirubin: 2.8 mg/dL — ABNORMAL HIGH (ref 0.3–1.2)
Total Protein: 5 g/dL — ABNORMAL LOW (ref 6.5–8.1)

## 2021-11-18 LAB — CBC
HCT: 27.8 % — ABNORMAL LOW (ref 39.0–52.0)
Hemoglobin: 9.8 g/dL — ABNORMAL LOW (ref 13.0–17.0)
MCH: 35.6 pg — ABNORMAL HIGH (ref 26.0–34.0)
MCHC: 35.3 g/dL (ref 30.0–36.0)
MCV: 101.1 fL — ABNORMAL HIGH (ref 80.0–100.0)
Platelets: 42 10*3/uL — ABNORMAL LOW (ref 150–400)
RBC: 2.75 MIL/uL — ABNORMAL LOW (ref 4.22–5.81)
RDW: 14.6 % (ref 11.5–15.5)
WBC: 3.1 10*3/uL — ABNORMAL LOW (ref 4.0–10.5)
nRBC: 0 % (ref 0.0–0.2)

## 2021-11-18 LAB — CYTOLOGY - NON PAP

## 2021-11-18 MED ORDER — SPIRONOLACTONE 100 MG PO TABS
100.0000 mg | ORAL_TABLET | Freq: Every day | ORAL | 1 refills | Status: DC
  Filled 2021-11-18: qty 30, 30d supply, fill #0
  Filled 2021-12-15: qty 30, 30d supply, fill #1

## 2021-11-18 MED ORDER — CIPROFLOXACIN HCL 500 MG PO TABS
500.0000 mg | ORAL_TABLET | Freq: Every day | ORAL | 1 refills | Status: DC
  Filled 2021-11-18: qty 30, 30d supply, fill #0
  Filled 2021-12-15: qty 30, 30d supply, fill #1

## 2021-11-18 MED ORDER — LACTULOSE 10 GM/15ML PO SOLN: 20.0000 g | Freq: Four times a day (QID) | ORAL | Status: DC

## 2021-11-18 MED ORDER — CIPROFLOXACIN HCL 500 MG PO TABS
500.0000 mg | ORAL_TABLET | Freq: Two times a day (BID) | ORAL | Status: DC
  Administered 2021-11-18: 500 mg via ORAL
  Filled 2021-11-18: qty 1

## 2021-11-18 MED ORDER — PANTOPRAZOLE SODIUM 40 MG PO TBEC
40.0000 mg | DELAYED_RELEASE_TABLET | Freq: Every day | ORAL | 1 refills | Status: DC
  Filled 2021-11-18: qty 30, 30d supply, fill #0
  Filled 2021-12-15: qty 30, 30d supply, fill #1

## 2021-11-18 MED ORDER — RIFAXIMIN 550 MG PO TABS
550.0000 mg | ORAL_TABLET | Freq: Two times a day (BID) | ORAL | 1 refills | Status: DC
  Filled 2021-11-18: qty 60, 30d supply, fill #0

## 2021-11-18 MED ORDER — FUROSEMIDE 40 MG PO TABS
40.0000 mg | ORAL_TABLET | Freq: Every day | ORAL | 1 refills | Status: DC
  Filled 2021-11-18: qty 30, 30d supply, fill #0
  Filled 2021-12-15: qty 30, 30d supply, fill #1

## 2021-11-18 MED ORDER — CIPROFLOXACIN HCL 500 MG PO TABS: 500.0000 mg | ORAL_TABLET | Freq: Every day | ORAL | Status: DC

## 2021-11-18 MED ORDER — PROPRANOLOL HCL 10 MG PO TABS
10.0000 mg | ORAL_TABLET | Freq: Two times a day (BID) | ORAL | 1 refills | Status: DC
  Filled 2021-11-18: qty 60, 30d supply, fill #0

## 2021-11-18 MED ORDER — POTASSIUM CHLORIDE 20 MEQ PO PACK
20.0000 meq | PACK | ORAL | Status: AC
  Administered 2021-11-18 (×2): 20 meq via ORAL
  Filled 2021-11-18 (×2): qty 1

## 2021-11-18 MED ORDER — ACETAMINOPHEN 325 MG PO TABS: 650.0000 mg | ORAL_TABLET | Freq: Four times a day (QID) | ORAL | Status: DC | PRN

## 2021-11-18 MED ORDER — CIPROFLOXACIN HCL 500 MG PO TABS
500.0000 mg | ORAL_TABLET | Freq: Two times a day (BID) | ORAL | 0 refills | Status: AC
  Filled 2021-11-18: qty 5, 3d supply, fill #0

## 2021-11-18 NOTE — Progress Notes (Signed)
Ronald Londo [name] [place] to be D/C'd per MD order. Discussed with the patient and all questions fully answered. ? VSS, Skin clean, dry and intact without evidence of skin break down, no evidence of skin tears noted. ? IV catheter discontinued intact. Site without signs and symptoms of complications. Dressing and pressure applied. ? An After Visit Summary was printed and given to the patient. Patient informed where to pickup prescriptions. ? D/c education completed with patient/family including follow up instructions, medication list, d/c activities limitations if indicated, with other d/c instructions as indicated by MD - patient able to verbalize understanding, all questions fully answered. All education and discharge instructions completed with Raquel with interpretation services. ? Patient instructed to return to ED, call 911, or call MD for any changes in condition.  ? Patient to be escorted via WC, and D/C home via private auto.

## 2021-11-18 NOTE — Discharge Summary (Addendum)
[facility] Discharge Summary  Patient name: Mathew [name] [place] Medical record number: [id number] Date of birth: 1980-11-25 Age: 41 y.o. Gender: male Date of Admission: 11/15/2021  Date of Discharge: 11/18/2021 Admitting Physician: Arlyce Dice, MD  Primary Care Provider: [name], MD Consultants: GI  Indication for Hospitalization: SBP  Brief Hospital Course:  Mathew [name] is a 41 y.o. male who presented with diffuse abdominal pain and found to have SBP. PMH is significant for alcoholic cirrhosis.   Spontaneous bacterial peritonitis Presented from residential alcohol treatment facility (no alcohol for nearly 3 months) with abdominal pain and swelling on exam with fever to 101.2 and tachycardia. CTAP notable for gallstones and questionable stone in CBD with moderate ascites. He was started on Zosyn, rifaximin, thiamine, folic acid, and lactulose. 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 Supportive care only without anticoagulation recommended for thrombus. IR paracentesis revealed ascitic fluid with >250 PMNs; Zosyn was continued. On the day of discharge, he was feeling much better, and he was started on cipro to finish treatment and for prophylaxis as below.   Chronic conditions Anemia - holding iron supplement due to infection concern Thrombocytopenia - trend with CBC, likely 2/2 hepatic dysfunction  Issues for follow up: Ensure cipro 500 mg bid until 11/11 for SBP treatment. Scheduled to start cipro 500 mg daily on 11/12 for SBP prophylaxis F/u with [facility] in 6 weeks for SBP and SMV thrombus Recommend lactulose dose decrease, please reassess and adjust as appropriate Revisit starting acamprosate outpatient for help with alcohol cessation while at residential facility, if desired, given he thinks he was not able to afford it in the past Consider discussion for need of abdominal girdle for  hernia  Discharge Diagnoses/Problem List:  Principal Problem for Admission: SBP Present on Admission:  Spontaneous bacterial peritonitis ([place])  Decompensated hepatic cirrhosis ([place])  Alcoholic cirrhosis of liver with ascites ([place])  Umbilical hernia  Cirrhosis of liver ([place])  Thrombocytopenia ([place])  Hypokalemia  Disposition: Home  Discharge Condition: Stable  Discharge Exam:  Blood pressure 112/68, pulse 88, temperature 98.3 F (36.8 C), temperature source Oral, resp. rate 17, height 5\' 1"  (1.549 m), weight 77.1 kg, SpO2 98 %.  General: Alert and oriented, in NAD Skin: Warm, dry HEENT: NCAT, EOM grossly normal, midline nasal septum Cardiac: RRR, no m/r/g appreciated Respiratory: CTAB anteriorly, breathing and speaking comfortably on RA Abdominal: Soft, nontender, mildly distended, normoactive bowel sounds; small reducible umbilical hernia Extremities: Moves all extremities grossly equally in bed Neurological: No gross focal deficit Psychiatric: Appropriate mood and affect   Significant Procedures: IR paracentesis  Significant Labs and Imaging:  Recent Labs  Lab 11/17/21 0356 11/18/21 0342  WBC 3.7* 3.1*  HGB 9.2* 9.8*  HCT 26.6* 27.8*  PLT 38* 42*   Recent Labs  Lab 11/16/21 1544 11/17/21 0356 11/18/21 0342  NA 135 135 134*  K 3.3* 3.4* 3.1*  CL 108 110 108  CO2 21* 20* 21*  GLUCOSE 108* 112* 95  BUN 9 10 8   CREATININE 0.61 0.64 0.58*  CALCIUM 7.9* 7.7* 7.8*  MG  --  1.7  --   ALKPHOS  --  69 77  AST  --  27 24  ALT  --  21 21  ALBUMIN  --  2.0* 2.0*   IR Paracentesis  Result Date: 11/16/2021 INDICATION: 41 year old male. History of alcoholic cirrhosis. Presented to ED with abdominal pain found to have ascites.  Request is for therapeutic and diagnostic paracentesis EXAM: ULTRASOUND GUIDED THERAPEUTIC AND DIAGNOSTIC PARACENTESIS MEDICATIONS: Lidocaine 1% 10 mL COMPLICATIONS: None immediate. PROCEDURE: Informed written consent was obtained from the patient  after a discussion of the risks, benefits and alternatives to treatment. A timeout was performed prior to the initiation of the procedure. Initial ultrasound scanning demonstrates a small amount of ascites within the right lower abdominal quadrant. The right lower abdomen was prepped and draped in the usual sterile fashion. 1% lidocaine was used for local anesthesia. Following this, a 6 Fr Safe-T-Centesis catheter was introduced. An ultrasound image was saved for documentation purposes. The paracentesis was performed. The catheter was removed and a dressing was applied. The patient tolerated the procedure well without immediate post procedural complication. FINDINGS: A total of approximately 800 mL of straw-colored fluid was removed. Samples were sent to the laboratory as requested by the clinical team. IMPRESSION: Successful ultrasound-guided paracentesis yielding 800 mL liters of peritoneal fluid. Read by: Rushie Nyhan, NP PLAN: If the patient eventually requires >/=2 paracenteses in a 30 day period, candidacy for formal evaluation by the [facility] will be assessed. Electronically Signed   By: Jacqulynn Cadet M.D.   On: 11/16/2021 13:52   MR ABDOMEN MRCP W WO CONTAST  Result Date: 11/16/2021 CLINICAL DATA:  Jaundice. Alcohol induced cirrhosis. Presents with abdominal pain. Evaluate for common bile duct stones. EXAM: MRI ABDOMEN WITHOUT AND WITH CONTRAST (INCLUDING MRCP) TECHNIQUE: Multiplanar multisequence MR imaging of the abdomen was performed both before and after the administration of intravenous contrast. Heavily T2-weighted images of the biliary and pancreatic ducts were obtained, and three-dimensional MRCP images were rendered by post processing. CONTRAST:  70mL GADAVIST GADOBUTROL 1 MMOL/ML IV SOLN COMPARISON:  11/15/2021 FINDINGS: Lower chest: There is a small to moderate left pleural effusion. Hepatobiliary: The liver appears shrunken with a  diffuse nodular contour compatible with advanced cirrhosis. On the arterial phase images there are no focal enhancing liver lesions identified. No abnormal areas of washout noted on the delayed images. Multiple stones are identified within the gallbladder which measure up to 8 mm as seen on the CT from the prior day. There is diffuse gallbladder wall edema which in the setting of cirrhosis, ascites and portal venous hypertension is nonspecific. The gallbladder wall measures up to 7 mm in thickness, image 19/3. Motion artifact limits assessment of the common bile duct. The common bile duct has a normal caliber measuring 3 mm. No intrahepatic bile duct dilatation. Within the limitation of motion artifact, there are no signs to suggest choledocholithiasis. Pancreas: No mass, inflammatory changes, or other parenchymal abnormality identified. Spleen: Measures 13.8 x 5.7 by 15.7 cm (volume = 650 cm^3). No suspicious splenic lesion. Adrenals/Urinary Tract: Normal adrenal glands. No hydronephrosis or kidney mass identified. Stomach/Bowel: Visualized portions within the abdomen are unremarkable. There is wall thickening involving the ascending colon which may reflect segmental colitis or hepatic colopathy. No pathologic dilatation of the large or small bowel loops. Vascular/Lymphatic: Normal appearance of the abdominal aorta. Upper abdominal varicosities are identified. Nonocclusive thrombus eccentric mural thrombus identified within the superior mesenteric vein, image 79/1302. The splenic vein and portal vein remain patent. There is diffuse venous congestion identified within the central mesentery. No significant adenopathy identified. Other:  There is a large volume of abdominopelvic ascites. Musculoskeletal: IMPRESSION: 1. Morphologic features of the liver compatible with advanced cirrhosis. No suspicious enhancing liver lesions identified. 2. Stigmata of portal venous hypertension identified including varicosities,  splenomegaly and  ascites. 3. Gallstones. There is diffuse gallbladder wall edema which in the setting of cirrhosis, ascites and portal venous hypertension is nonspecific. 4. No signs of bile duct dilatation or choledocholithiasis. 5. Nonocclusive thrombus eccentric mural thrombus within the superior mesenteric vein. No thrombus identified within the portal vein or splenic vein. 6. Small to moderate left pleural effusion. 7. Wall thickening involving the ascending colon which may reflect segmental colitis or hepatic colopathy. Electronically Signed   By: Signa Kell M.D.   On: 11/16/2021 11:51   MR 3D Recon At Scanner  Result Date: 11/16/2021 CLINICAL DATA:  Jaundice. Alcohol induced cirrhosis. Presents with abdominal pain. Evaluate for common bile duct stones. EXAM: MRI ABDOMEN WITHOUT AND WITH CONTRAST (INCLUDING MRCP) TECHNIQUE: Multiplanar multisequence MR imaging of the abdomen was performed both before and after the administration of intravenous contrast. Heavily T2-weighted images of the biliary and pancreatic ducts were obtained, and three-dimensional MRCP images were rendered by post processing. CONTRAST:  48mL GADAVIST GADOBUTROL 1 MMOL/ML IV SOLN COMPARISON:  11/15/2021 FINDINGS: Lower chest: There is a small to moderate left pleural effusion. Hepatobiliary: The liver appears shrunken with a diffuse nodular contour compatible with advanced cirrhosis. On the arterial phase images there are no focal enhancing liver lesions identified. No abnormal areas of washout noted on the delayed images. Multiple stones are identified within the gallbladder which measure up to 8 mm as seen on the CT from the prior day. There is diffuse gallbladder wall edema which in the setting of cirrhosis, ascites and portal venous hypertension is nonspecific. The gallbladder wall measures up to 7 mm in thickness, image 19/3. Motion artifact limits assessment of the common bile duct. The common bile duct has a normal caliber  measuring 3 mm. No intrahepatic bile duct dilatation. Within the limitation of motion artifact, there are no signs to suggest choledocholithiasis. Pancreas: No mass, inflammatory changes, or other parenchymal abnormality identified. Spleen: Measures 13.8 x 5.7 by 15.7 cm (volume = 650 cm^3). No suspicious splenic lesion. Adrenals/Urinary Tract: Normal adrenal glands. No hydronephrosis or kidney mass identified. Stomach/Bowel: Visualized portions within the abdomen are unremarkable. There is wall thickening involving the ascending colon which may reflect segmental colitis or hepatic colopathy. No pathologic dilatation of the large or small bowel loops. Vascular/Lymphatic: Normal appearance of the abdominal aorta. Upper abdominal varicosities are identified. Nonocclusive thrombus eccentric mural thrombus identified within the superior mesenteric vein, image 79/1302. The splenic vein and portal vein remain patent. There is diffuse venous congestion identified within the central mesentery. No significant adenopathy identified. Other:  There is a large volume of abdominopelvic ascites. Musculoskeletal: IMPRESSION: 1. Morphologic features of the liver compatible with advanced cirrhosis. No suspicious enhancing liver lesions identified. 2. Stigmata of portal venous hypertension identified including varicosities, splenomegaly and ascites. 3. Gallstones. There is diffuse gallbladder wall edema which in the setting of cirrhosis, ascites and portal venous hypertension is nonspecific. 4. No signs of bile duct dilatation or choledocholithiasis. 5. Nonocclusive thrombus eccentric mural thrombus within the superior mesenteric vein. No thrombus identified within the portal vein or splenic vein. 6. Small to moderate left pleural effusion. 7. Wall thickening involving the ascending colon which may reflect segmental colitis or hepatic colopathy. Electronically Signed   By: Signa Kell M.D.   On: 11/16/2021 11:51   CT ABDOMEN  PELVIS W CONTRAST  Result Date: 11/15/2021 CLINICAL DATA:  Flank pain elevated bilirubin EXAM: CT ABDOMEN AND PELVIS WITH CONTRAST TECHNIQUE: Multidetector CT imaging of the abdomen and pelvis was performed using  the standard protocol following bolus administration of intravenous contrast. RADIATION DOSE REDUCTION: This exam was performed according to the departmental dose-optimization program which includes automated exposure control, adjustment of the mA and/or kV according to patient size and/or use of iterative reconstruction technique. CONTRAST:  65mL OMNIPAQUE IOHEXOL 350 MG/ML SOLN COMPARISON:  CT 11/05/2021, 09/01/2021 FINDINGS: Lower chest: Lung bases demonstrate interval small left effusion with passive atelectasis at the left base. Hepatobiliary: Liver cirrhosis. Contracted gallbladder with multiple stones. No intra hepatic or extrahepatic biliary dilatation. Questionable small ductal stones, coronal series 6 image 59 Pancreas: Unremarkable. No pancreatic ductal dilatation or surrounding inflammatory changes. Spleen: Enlarged, craniocaudal measurement of 16.2 cm. Adrenals/Urinary Tract: Adrenal glands are normal. Kidneys show no hydronephrosis. Slightly thick-walled urinary bladder Stomach/Bowel: The stomach is moderately enlarged. There is no dilated small bowel. Fluid in the colon. There is possible wall thickening of the right and proximal transverse colon. Vascular/Lymphatic: Nonaneurysmal aorta.  No suspicious lymph node Reproductive: Prostate is unremarkable. Other: No free air. Interim development of at least moderate volume abdominopelvic ascites. Small umbilical region hernia containing fluid. Musculoskeletal: No acute osseous abnormality IMPRESSION: 1. Liver cirrhosis. Interim development of at least moderate volume abdominopelvic ascites. Splenomegaly. 2. Contracted gallbladder with multiple stones. Questionable small stones within the common bile duct. No intra or extrahepatic biliary  dilatation. 3. Fluid in the colon. Possible wall thickening of the right and proximal transverse colon which may be secondary to colitis or portal colopathy. 4. Interval small left effusion with passive atelectasis at the left base. 5. Slightly thick-walled appearance of the urinary bladder, correlate for cystitis. Electronically Signed   By: Donavan Foil M.D.   On: 11/15/2021 19:24   DG Chest 2 View  Result Date: 11/11/2021 CLINICAL DATA:  Provided history: Chest pain. Additional history provided: Shortness of breath, history of cirrhosis. EXAM: CHEST - 2 VIEW COMPARISON:  Prior chest radiographs 11/05/2021 and earlier. FINDINGS: Heart size within normal limits. No appreciable airspace consolidation. No evidence of pleural effusion or pneumothorax. No acute bony abnormality identified. IMPRESSION: No evidence of active cardiopulmonary disease. Electronically Signed   By: Kellie Simmering D.O.   On: 11/11/2021 13:11   DG ELBOW COMPLETE LEFT (3+VIEW)  Result Date: 11/07/2021 CLINICAL DATA:  Pain after fall this morning. EXAM: LEFT ELBOW - COMPLETE 3+ VIEW COMPARISON:  None Available. FINDINGS: There is no evidence of fracture, dislocation, or joint effusion. There is no evidence of arthropathy or other focal bone abnormality. Soft tissues are unremarkable. IMPRESSION: Negative. Electronically Signed   By: Dorise Bullion III M.D.   On: 11/07/2021 09:58   ECHOCARDIOGRAM COMPLETE  Result Date: 11/06/2021    ECHOCARDIOGRAM REPORT   Patient Name:   COBE SAMANIEGO CORDOBA Date of Exam: 11/06/2021 Medical Rec #:  [id number]            Height:       65.0 in Accession #:    [id number]           Weight:       183.7 lb Date of Birth:  Jul 08, 1980            BSA:          1.908 m Patient Age:    28 years             BP:           110/58 mmHg Patient Gender: M  HR:           85 bpm. Exam Location:  Inpatient Procedure: 2D Echo, Cardiac Doppler and Color Doppler Indications:    R07.9 Chest Pain  History:         Patient has prior history of Echocardiogram examinations, most                 recent 06/17/2021. Tobacco and ETOH abuse, Cirrhosis of liver.  Sonographer:    Alvino Chapel RCS Referring Phys: [place]  1. Left ventricular ejection fraction, by estimation, is 65 to 70%. The left ventricle has normal function. The left ventricle has no regional wall motion abnormalities. There is mild concentric left ventricular hypertrophy. Left ventricular diastolic parameters are consistent with Grade I diastolic dysfunction (impaired relaxation).  2. Right ventricular systolic function is hyperdynamic. The right ventricular size is normal. There is normal pulmonary artery systolic pressure.  3. Left atrial size was mildly dilated.  4. The mitral valve is normal in structure. No evidence of mitral valve regurgitation. No evidence of mitral stenosis.  5. The aortic valve is tricuspid. Aortic valve regurgitation is not visualized. No aortic stenosis is present.  6. The inferior vena cava is normal in size with greater than 50% respiratory variability, suggesting right atrial pressure of 3 mmHg. Comparison(s): No significant change from prior study. FINDINGS  Left Ventricle: Left ventricular ejection fraction, by estimation, is 65 to 70%. The left ventricle has normal function. The left ventricle has no regional wall motion abnormalities. The left ventricular internal cavity size was normal in size. There is  mild concentric left ventricular hypertrophy. Left ventricular diastolic parameters are consistent with Grade I diastolic dysfunction (impaired relaxation). Right Ventricle: The right ventricular size is normal. No increase in right ventricular wall thickness. Right ventricular systolic function is hyperdynamic. There is normal pulmonary artery systolic pressure. The tricuspid regurgitant velocity is 2.30 m/s, and with an assumed right atrial pressure of 3 mmHg, the estimated right ventricular  systolic pressure is [id number] mmHg. Left Atrium: Left atrial size was mildly dilated. Right Atrium: Right atrial size was normal in size. Pericardium: There is no evidence of pericardial effusion. Mitral Valve: The mitral valve is normal in structure. No evidence of mitral valve regurgitation. No evidence of mitral valve stenosis. Tricuspid Valve: The tricuspid valve is normal in structure. Tricuspid valve regurgitation is mild . No evidence of tricuspid stenosis. Aortic Valve: The aortic valve is tricuspid. Aortic valve regurgitation is not visualized. No aortic stenosis is present. Aortic valve mean gradient measures 7.0 mmHg. Aortic valve peak gradient measures 14.4 mmHg. Aortic valve area, by VTI measures 2.11  cm. Pulmonic Valve: The pulmonic valve was not well visualized. Pulmonic valve regurgitation is not visualized. Aorta: The aortic root is normal in size and structure and the ascending aorta was not well visualized. Venous: The inferior vena cava is normal in size with greater than 50% respiratory variability, suggesting right atrial pressure of 3 mmHg. IAS/Shunts: No atrial level shunt detected by color flow Doppler.  LEFT VENTRICLE PLAX 2D LVIDd:         4.60 cm   Diastology LVIDs:         2.50 cm   LV e' medial:    8.05 cm/s LV PW:         1.20 cm   LV E/e' medial:  7.3 LV IVS:        1.20 cm   LV e' lateral:   9.14 cm/s  LVOT diam:     1.90 cm   LV E/e' lateral: 6.5 LV SV:         73 LV SV Index:   38 LVOT Area:     2.84 cm  RIGHT VENTRICLE RV S prime:     23.50 cm/s TAPSE (M-mode): 2.4 cm LEFT ATRIUM             Index        RIGHT ATRIUM           Index LA diam:        4.70 cm 2.46 cm/m   RA Area:     20.30 cm LA Vol (A2C):   55.8 ml 29.24 ml/m  RA Volume:   58.90 ml  30.87 ml/m LA Vol (A4C):   70.3 ml 36.84 ml/m LA Biplane Vol: 62.9 ml 32.97 ml/m  AORTIC VALVE AV Area (Vmax):    1.72 cm AV Area (Vmean):   2.00 cm AV Area (VTI):     2.11 cm AV Vmax:           190.00 cm/s AV Vmean:           117.500 cm/s AV VTI:            0.349 m AV Peak Grad:      14.4 mmHg AV Mean Grad:      7.0 mmHg LVOT Vmax:         115.00 cm/s LVOT Vmean:        82.900 cm/s LVOT VTI:          0.259 m LVOT/AV VTI ratio: 0.74  AORTA Ao Root diam: 3.10 cm MITRAL VALVE               TRICUSPID VALVE MV Area (PHT): 2.66 cm    TR Peak grad:   21.2 mmHg MV Decel Time: 285 msec    TR Vmax:        230.00 cm/s MV E velocity: 59.10 cm/s MV A velocity: 75.20 cm/s  SHUNTS MV E/A ratio:  0.79        Systemic VTI:  0.26 m                            Systemic Diam: 1.90 cm [name] MD Electronically signed by [name] MD Signature Date/Time: 11/06/2021/3:59:57 PM    Final    [place] ASCITES (ABDOMEN LIMITED)  Result Date: 11/06/2021 CLINICAL DATA:  Assess ascites. EXAM: LIMITED ABDOMEN ULTRASOUND FOR ASCITES TECHNIQUE: Limited ultrasound survey for ascites was performed in all four abdominal quadrants. COMPARISON:  None Available. FINDINGS: A small amount of ascites is identified throughout the abdomen. No drainable collection of ascites is noted. IMPRESSION: Small amount of ascites. The amount of ascites is not amenable to paracentesis. Electronically Signed   By: Dorise Bullion III M.D.   On: 11/06/2021 10:13   CT Head Wo Contrast  Result Date: 11/05/2021 CLINICAL DATA:  Cirrhosis, altered level of consciousness, confusion EXAM: CT HEAD WITHOUT CONTRAST TECHNIQUE: Contiguous axial images were obtained from the base of the skull through the vertex without intravenous contrast. RADIATION DOSE REDUCTION: This exam was performed according to the departmental dose-optimization program which includes automated exposure control, adjustment of the mA and/or kV according to patient size and/or use of iterative reconstruction technique. COMPARISON:  06/30/2021 FINDINGS: Brain: No acute infarct or hemorrhage. Lateral ventricles and midline structures are unremarkable. No acute extra-axial fluid collections. No mass  effect.  Vascular: No hyperdense vessel or unexpected calcification. Skull: Normal. Negative for fracture or focal lesion. Sinuses/Orbits: No acute finding. Other: None. IMPRESSION: 1. No acute intracranial process. Electronically Signed   By: Sharlet Salina M.D.   On: 11/05/2021 18:08   CT ABDOMEN PELVIS W CONTRAST  Result Date: 11/05/2021 CLINICAL DATA:  Acute abdominal pain. History of cirrhosis and ascites. EXAM: CT ABDOMEN AND PELVIS WITH CONTRAST TECHNIQUE: Multidetector CT imaging of the abdomen and pelvis was performed using the standard protocol following bolus administration of intravenous contrast. RADIATION DOSE REDUCTION: This exam was performed according to the departmental dose-optimization program which includes automated exposure control, adjustment of the mA and/or kV according to patient size and/or use of iterative reconstruction technique. CONTRAST:  64mL OMNIPAQUE IOHEXOL 350 MG/ML SOLN COMPARISON:  CT abdomen and pelvis 09/01/2021 FINDINGS: Lower chest: No acute abnormality. Hepatobiliary: Gallstones are present. Mild nodular liver contour persists. No focal liver lesions are seen. Gallstones are present. There is no biliary ductal dilatation. Pancreas: Unremarkable. No pancreatic ductal dilatation or surrounding inflammatory changes. Spleen: Borderline enlarged, unchanged. Adrenals/Urinary Tract: Adrenal glands are unremarkable. Kidneys are normal, without renal calculi, focal lesion, or hydronephrosis. Bladder is unremarkable. Stomach/Bowel: There is diffuse gaseous distention of the colon without dilatation. There is no focal wall thickening or inflammation. The appendix, small bowel and stomach are within normal limits. Vascular/Lymphatic: No significant vascular findings are present. No enlarged abdominal or pelvic lymph nodes. Reproductive: Prostate is unremarkable. Other: No abdominal wall hernia or abnormality. No abdominopelvic ascites. Musculoskeletal: No acute or significant osseous  findings. IMPRESSION: 1. No acute localizing process in the abdomen or pelvis. 2. Stable findings of cirrhosis and portal hypertension. 3. Cholelithiasis. Electronically Signed   By: Darliss Cheney M.D.   On: 11/05/2021 18:08   DG Chest 2 View  Result Date: 11/05/2021 CLINICAL DATA:  Altered mental status EXAM: CHEST - 2 VIEW COMPARISON:  07/19/2021 FINDINGS: Transverse diameter of heart is increased. There are no signs of pulmonary edema or focal pulmonary consolidation. There is no pleural effusion or pneumothorax. IMPRESSION: There are no focal infiltrates or signs of pulmonary edema. Electronically Signed   By: Ernie Avena M.D.   On: 11/05/2021 13:27   Results/Tests Pending at Time of Discharge: ascitic fluid culture, ascitic fluid cytology  Discharge Medications:  Allergies as of 11/18/2021   No Known Allergies      Medication List     STOP taking these medications    predniSONE 5 MG tablet Commonly known as: DELTASONE       TAKE these medications    acetaminophen 325 MG tablet Commonly known as: TYLENOL Take 2 tablets (650 mg total) by mouth every 6 (six) hours as needed for mild pain (or Fever >/= 101).   ciprofloxacin 500 MG tablet Commonly known as: CIPRO Take 1 tablet (500 mg total) by mouth 2 (two) times daily for 2 days.   ciprofloxacin 500 MG tablet Commonly known as: CIPRO Take 1 tablet (500 mg total) by mouth daily. Start taking on: November 21, 2021   FeroSul 325 (65 FE) MG tablet Generic drug: ferrous sulfate Tome 1 tableta (325 mg en total) por va oral diariamente con el desayuno. (Take 1 tablet (325 mg total) by mouth daily with breakfast.)   folic acid 1 MG tablet Commonly known as: FOLVITE Tome 1 tableta (1 mg en total) por va oral diariamente. (Take 1 tablet (1 mg total) by mouth daily.)   furosemide 40 MG tablet Commonly known as:  LASIX Tome 1 tableta (40 mg en total) por va oral diariamente. (Take 1 tablet (40 mg total) by mouth  daily.)   lactulose 10 GM/15ML solution Commonly known as: CHRONULAC Take 45 mLs (30 g total) by mouth every 6 (six) hours.   multivitamin with minerals Tabs tablet Take 1 tablet by mouth daily.   nicotine 14 mg/24hr patch Commonly known as: NICODERM CQ - dosed in mg/24 hours Place 1 patch (14 mg total) onto the skin daily.   pantoprazole 40 MG tablet Commonly known as: PROTONIX Take 1 tablet (40 mg total) by mouth daily at 6 (six) AM.   propranolol 10 MG tablet Commonly known as: INDERAL Take 1 tablet (10 mg total) by mouth 2 (two) times daily.   rifaximin 550 MG Tabs tablet Commonly known as: XIFAXAN Take 1 tablet (550 mg total) by mouth 2 (two) times daily.   spironolactone 100 MG tablet Commonly known as: ALDACTONE Tome 1 tableta (100 mg en total) por va oral diariamente. (Take 1 tablet (100 mg total) by mouth daily.)   thiamine 100 MG tablet Commonly known as: VITAMIN B1 Tome 1 tableta (100 mg en total) por va oral diariamente. (Take 1 tablet (100 mg total) by mouth daily.)   Vitamin D (Ergocalciferol) 1.25 MG (50000 UNIT) Caps capsule Commonly known as: DRISDOL Take 1 capsule (50,000 Units total) by mouth every Wednesday. What changed: when to take this       Discharge Instructions: Please refer to Patient Instructions section of EMR for full details.  Patient was counseled important signs and symptoms that should prompt return to medical care, changes in medications, dietary instructions, activity restrictions, and follow up appointments.   Follow-Up Appointments:  Follow-up Information     Gastroenterology, [facility]. Schedule an appointment as soon as possible for a visit in 6 week(s).   Contact information: [place] [address] [phone]         [name], MD. Schedule an appointment as soon as possible for a visit today.   Specialty: Pulmonary Disease Why: Make an appointment ASAP for hospital follow up. Contact  information: [address] [place] [address] [phone]                Ethelene Hal, MD 11/18/2021, 2:41 PM PGY-1, [place]  I was personally present and performed or re-performed the history, physical exam and medical decision making activities of this service and have verified that the service and findings are accurately documented in the intern's note. My edits are noted above within the note. Please also see attending's attestation.   Donney Dice, DO                  11/18/2021, 3:11 PM  PGY-3, [place]

## 2021-11-18 NOTE — Progress Notes (Addendum)
     Daily Progress Note Intern Pager: [phone]  Patient name: Mathew [name] [place] Medical record number: [id number] Date of birth: August 26, 1980 Age: 41 y.o. Gender: male  Primary Care Provider: [name], MD Consultants: GI Code Status: Full   Pt Overview and Major Events to Date:  11/6 - admitted   Assessment and Plan:   Mathew [name] is a 41 y.o. male who presented with diffuse abdominal pain and swelling. Pertinent PMH/PSH includes alcoholic cirrhosis.   * Spontaneous bacterial peritonitis (HCC) VSS. Mild abdominal pain present on exam. 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 Ascitic fluid analyses with >250 PMNs qualifying for SBP. - Switched to IV rocephin yesterday; can consider switching to cipro to finish treatment and for prophylaxis - GI signed off - Tylenol prn for pain and fever - AM CBC, CMP, PT-INR  Alcoholic cirrhosis of liver with ascites (HCC) Moderate ascites on CT abdomen/pelvis. IR tap with ascitic fluid >250 PMNs indicating SBP. Culture with NGTD and cytology still pending. - GI following, appreciate recs - F/u ascitic fluid culture and cytology - Restarted lactulose - AM PT-INR, CBC, and CMET - Continue rifaximin, thiamine, folic acid, lactulose  Hypokalemia K 3.1; repleted with 40 mEq. - AM CMP as above if staying tonight  Hypomagnesemia Mg stable at 1.7 yesterday. -F/u AM Mg if stays tonight   FEN/GI: clear liquid PPx: SCDs given prolonged INR and thrombocytopenia Dispo:Home pending abx regimen  Subjective:  Doing well this morning without complaints.  Objective: Temp:  [97.6 F (36.4 C)-98.7 F (37.1 C)] 98.3 F (36.8 C) (11/09 0929) Pulse Rate:  [88-90] 88 (11/09 0929) Resp:  [16-17] 17 (11/09 0929) BP: (112-121)/(68-85) 112/68 (11/09 0929) SpO2:  [97 %-100 %] 98 % (11/09  0929) Physical Exam: General: Alert and oriented, in NAD Skin: Warm, dry HEENT: NCAT, EOM grossly normal, midline nasal septum Cardiac: RRR, no m/r/g appreciated Respiratory: CTAB anteriorly, breathing and speaking comfortably on RA Abdominal: Soft, nontender, mildly distended, normoactive bowel sounds; small reducible umbilical hernia Extremities: Moves all extremities grossly equally in bed Neurological: No gross focal deficit Psychiatric: Appropriate mood and affect   Laboratory: Most recent CBC Lab Results  Component Value Date   WBC 3.1 (L) 11/18/2021   HGB 9.8 (L) 11/18/2021   HCT 27.8 (L) 11/18/2021   MCV 101.1 (H) 11/18/2021   PLT 42 (L) 11/18/2021   Most recent BMP    Latest Ref Rng & Units 11/18/2021    3:42 AM  BMP  Glucose 70 - 99 mg/dL 95   BUN 6 - 20 mg/dL 8   Creatinine 1.09 - 3.23 mg/dL 5.57   Sodium 322 - 025 mmol/L 134   Potassium 3.5 - 5.1 mmol/L 3.1   Chloride 98 - 111 mmol/L 108   CO2 22 - 32 mmol/L 21   Calcium 8.9 - 10.3 mg/dL 7.8    Janeal Holmes, MD 11/18/2021, 1:01 PM PGY-1, [facility] FPTS Intern pager: [phone], text pages welcome Secure chat group [facility] Teaching Service

## 2021-11-18 NOTE — Discharge Instructions (Addendum)
Dear Mathew [name],   Thank you for letting us participate in your care! In this section, you will find a brief hospital admission summary of why you were admitted to the hospital and post-hospital plan.  You were admitted because you have abdominal pain and found to have bacteria infection of the abdomen (spontaneous bacteria peritonitis).  And you were treated with antibiotics.  It is important to continue to abstain from alcohol use.  POST-HOSPITAL & CARE INSTRUCTIONS Continue antibiotic to take twice daily until 11/20/21 and then take one tablet daily. Please let PCP/Specialists know of any changes in medications that were made.  Please see medications section of this packet for any medication changes.   DOCTOR'S APPOINTMENTS & FOLLOW UP Future Appointments  Date Time Provider Department Center  01/05/2022 10:30 AM [name], MD [facility] None     Thank you for choosing [facility]! Take care and be well!  Family Medicine Teaching Service Inpatient Team [place]  [facility]  [address] [place], [place] [address] [phone]

## 2021-11-21 LAB — CULTURE, BODY FLUID W GRAM STAIN -BOTTLE: Culture: NO GROWTH

## 2021-11-22 ENCOUNTER — Telehealth: Payer: Self-pay

## 2021-11-22 NOTE — Telephone Encounter (Signed)
Transition Care Management Unsuccessful Follow-up Telephone Call  Call placed with assistance of Spanish interpreter [id number]/[employer].   Date of discharge and from where:  11/18/2021, [facility]   Attempts:  1st Attempt  Reason for unsuccessful TCM follow-up call:  Unable to leave message- [phone]  was not accepting messages.  Patient has an appointment with Dr [name] at [facility] - 01/05/2022.

## 2021-11-23 ENCOUNTER — Telehealth: Payer: Self-pay

## 2021-11-23 NOTE — Telephone Encounter (Signed)
Transition Care Management Unsuccessful Follow-up Telephone Call  Call placed with assistance of Spanish interpreter [id number]/[employer].   Date of discharge and from where:  11/18/2021, [facility]  Attempts:  2nd Attempt  Reason for unsuccessful TCM follow-up call:  Unable to leave message-[phone]  was not accepting messages.   Patient has an appointment with Dr [name] at [facility] - 01/05/2022.

## 2021-11-24 ENCOUNTER — Telehealth: Payer: Self-pay

## 2021-11-24 NOTE — Telephone Encounter (Signed)
Transition Care Management Unsuccessful Follow-up Telephone Call  Call placed with assistance of Spanish interpreter [id number]/[employer].     Date of discharge and from where:  11/18/2021, [facility]    Attempts:  3rd Attempt  Reason for unsuccessful TCM follow-up call:  Unable to leave message-[phone]  was not accepting messages.   Patient has an appointment with Dr [name] at [facility] - 01/05/2022.

## 2021-12-15 ENCOUNTER — Other Ambulatory Visit: Payer: Self-pay

## 2021-12-28 ENCOUNTER — Other Ambulatory Visit: Payer: Self-pay

## 2022-01-04 NOTE — Progress Notes (Unsigned)
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 normal in size and structure and the ascending aorta was not well visualized. Venous: The inferior vena cava is normal in size with greater than 50% respiratory variability, suggesting right atrial pressure of 3 mmHg. IAS/Shunts: No atrial level shunt detected by color flow Doppler.  LEFT VENTRICLE PLAX 2D LVIDd:         4.60 cm   Diastology LVIDs:         2.50 cm   LV e' medial:    8.05 cm/s LV PW:         1.20 cm   LV E/e' medial:  7.3 LV IVS:        1.20 cm   LV e' lateral:   9.14 cm/s LVOT diam:     1.90 cm   LV E/e' lateral: 6.5 LV SV:         73 LV SV Index:   38 LVOT Area:     2.84 cm  RIGHT VENTRICLE RV S prime:     23.50 cm/s TAPSE (M-mode): 2.4 cm LEFT ATRIUM             Index  RIGHT ATRIUM           Index LA diam:        4.70 cm 2.46 cm/m   RA Area:     20.30 cm LA Vol (A2C):   55.8 ml 29.24 ml/m  RA Volume:   58.90 ml  30.87 ml/m LA Vol (A4C):   70.3 ml 36.84 ml/m LA Biplane Vol: 62.9 ml 32.97 ml/m  AORTIC VALVE AV Area (Vmax):    1.72 cm AV Area (Vmean):   2.00 cm AV Area (VTI):     2.11 cm AV Vmax:           190.00 cm/s AV Vmean:          117.500 cm/s AV VTI:             0.349 m AV Peak Grad:      14.4 mmHg AV Mean Grad:      7.0 mmHg LVOT Vmax:         115.00 cm/s LVOT Vmean:        82.900 cm/s LVOT VTI:          0.259 m LVOT/AV VTI ratio: 0.74  AORTA Ao Root diam: 3.10 cm MITRAL VALVE               TRICUSPID VALVE MV Area (PHT): 2.66 cm    TR Peak grad:   21.2 mmHg MV Decel Time: 285 msec    TR Vmax:        230.00 cm/s MV E velocity: 59.10 cm/s MV A velocity: 75.20 cm/s  SHUNTS MV E/A ratio:  0.79        Systemic VTI:  0.26 m                            Systemic Diam: 1.90 cm Riley LamMahesh Chandrasekhar MD Electronically signed by Riley LamMahesh Chandrasekhar MD Signature Date/Time: 11/06/2021/3:59:57 PM    Final     US ASCITES (ABDOMEN LIMITED)   Result Date: 11/06/2021 CLINICAL DATA:  Assess ascites. EXAM: LIMITED ABDOMEN ULTRASOUND FOR ASCITES TECHNIQUE: Limited ultrasound survey for ascites was performed in all four abdominal quadrants. COMPARISON:  None Available. FINDINGS: A small amount of ascites is identified throughout the abdomen. No drainable collection of ascites is noted. IMPRESSION: Small amount of ascites. The amount of ascites is not amenable to paracentesis. Electronically Signed   By: Gerome Samavid  Williams III M.D.   On: 11/06/2021 10:13    CT Head Wo Contrast   Result Date: 11/05/2021 CLINICAL DATA:  Cirrhosis, altered level of consciousness, confusion EXAM: CT HEAD WITHOUT CONTRAST TECHNIQUE: Contiguous axial images were obtained from the base of the skull through the vertex without intravenous contrast. RADIATION DOSE REDUCTION: This exam was performed according to the departmental dose-optimization program which includes automated exposure control, adjustment of the mA and/or kV according to patient size and/or use of iterative reconstruction technique. COMPARISON:  06/30/2021 FINDINGS: Brain: No acute infarct or hemorrhage. Lateral ventricles and midline structures are unremarkable. No acute extra-axial fluid collections. No mass effect. Vascular: No hyperdense  vessel or unexpected calcification. Skull: Normal. Negative for fracture or focal lesion. Sinuses/Orbits: No acute finding. Other: None. IMPRESSION: 1. No acute intracranial process. Electronically Signed   By: Sharlet SalinaMichael  Brown M.D.   On: 11/05/2021 18:08    CT ABDOMEN PELVIS W CONTRAST   Result Date: 11/05/2021 CLINICAL DATA:  Acute abdominal pain. History of cirrhosis and ascites. EXAM: CT ABDOMEN AND PELVIS WITH  CONTRAST TECHNIQUE: Multidetector CT imaging of the abdomen and pelvis was performed using the standard protocol following bolus administration of intravenous contrast. RADIATION DOSE REDUCTION: This exam was performed according to the departmental dose-optimization program which includes automated exposure control, adjustment of the mA and/or kV according to patient size and/or use of iterative reconstruction technique. CONTRAST:  75mL OMNIPAQUE IOHEXOL 350 MG/ML SOLN COMPARISON:  CT abdomen and pelvis 09/01/2021 FINDINGS: Lower chest: No acute abnormality. Hepatobiliary: Gallstones are present. Mild nodular liver contour persists. No focal liver lesions are seen. Gallstones are present. There is no biliary ductal dilatation. Pancreas: Unremarkable. No pancreatic ductal dilatation or surrounding inflammatory changes. Spleen: Borderline enlarged, unchanged. Adrenals/Urinary Tract: Adrenal glands are unremarkable. Kidneys are normal, without renal calculi, focal lesion, or hydronephrosis. Bladder is unremarkable. Stomach/Bowel: There is diffuse gaseous distention of the colon without dilatation. There is no focal wall thickening or inflammation. The appendix, small bowel and stomach are within normal limits. Vascular/Lymphatic: No significant vascular findings are present. No enlarged abdominal or pelvic lymph nodes. Reproductive: Prostate is unremarkable. Other: No abdominal wall hernia or abnormality. No abdominopelvic ascites. Musculoskeletal: No acute or significant osseous findings. IMPRESSION: 1.  No acute localizing process in the abdomen or pelvis. 2. Stable findings of cirrhosis and portal hypertension. 3. Cholelithiasis. Electronically Signed   By: Darliss Cheney M.D.   On: 11/05/2021 18:08    DG Chest 2 View   Result Date: 11/05/2021 CLINICAL DATA:  Altered mental status EXAM: CHEST - 2 VIEW COMPARISON:  07/19/2021 FINDINGS: Transverse diameter of heart is increased. There are no signs of pulmonary edema or focal pulmonary consolidation. There is no pleural effusion or pneumothorax. IMPRESSION: There are no focal infiltrates or signs of pulmonary edema. Electronically Signed   By: Ernie Avena M.D.   On: 11/05/2021 13:27     The ASCVD Risk score (Arnett DK, et al., 2019) failed to calculate for the following reasons:   Cannot find a previous HDL lab   Cannot find a previous total cholesterol lab    Assessment & Plan:   Problem List Items Addressed This Visit   None  No follow-ups on file.    Shan Levans, MD

## 2022-01-05 ENCOUNTER — Encounter: Payer: Self-pay | Admitting: Critical Care Medicine

## 2022-01-05 ENCOUNTER — Other Ambulatory Visit: Payer: Self-pay

## 2022-01-05 ENCOUNTER — Ambulatory Visit: Payer: Self-pay | Attending: Critical Care Medicine | Admitting: Critical Care Medicine

## 2022-01-05 VITALS — BP 152/88 | HR 77 | Wt 176.8 lb

## 2022-01-05 DIAGNOSIS — K429 Umbilical hernia without obstruction or gangrene: Secondary | ICD-10-CM

## 2022-01-05 DIAGNOSIS — K7030 Alcoholic cirrhosis of liver without ascites: Secondary | ICD-10-CM

## 2022-01-05 DIAGNOSIS — D696 Thrombocytopenia, unspecified: Secondary | ICD-10-CM

## 2022-01-05 DIAGNOSIS — G9341 Metabolic encephalopathy: Secondary | ICD-10-CM

## 2022-01-05 DIAGNOSIS — K7460 Unspecified cirrhosis of liver: Secondary | ICD-10-CM

## 2022-01-05 DIAGNOSIS — K7290 Hepatic failure, unspecified without coma: Secondary | ICD-10-CM

## 2022-01-05 DIAGNOSIS — Z23 Encounter for immunization: Secondary | ICD-10-CM

## 2022-01-05 DIAGNOSIS — K7031 Alcoholic cirrhosis of liver with ascites: Secondary | ICD-10-CM

## 2022-01-05 DIAGNOSIS — K652 Spontaneous bacterial peritonitis: Secondary | ICD-10-CM

## 2022-01-05 MED ORDER — LACTULOSE 10 GM/15ML PO SOLN
30.0000 g | Freq: Four times a day (QID) | ORAL | 1 refills | Status: DC
  Filled 2022-01-05: qty 5400, 30d supply, fill #0
  Filled 2022-02-08: qty 1675, 10d supply, fill #0

## 2022-01-05 MED ORDER — PANTOPRAZOLE SODIUM 40 MG PO TBEC
40.0000 mg | DELAYED_RELEASE_TABLET | Freq: Every day | ORAL | 1 refills | Status: DC
  Filled 2022-01-05: qty 90, 90d supply, fill #0

## 2022-01-05 MED ORDER — THIAMINE HCL 100 MG PO TABS
100.0000 mg | ORAL_TABLET | Freq: Every day | ORAL | 2 refills | Status: DC
  Filled 2022-01-05: qty 100, 100d supply, fill #0

## 2022-01-05 MED ORDER — FUROSEMIDE 40 MG PO TABS
40.0000 mg | ORAL_TABLET | Freq: Every day | ORAL | 1 refills | Status: DC
  Filled 2022-01-05: qty 90, 90d supply, fill #0

## 2022-01-05 MED ORDER — FOLIC ACID 1 MG PO TABS
1.0000 mg | ORAL_TABLET | Freq: Every day | ORAL | 1 refills | Status: DC
  Filled 2022-01-05: qty 60, 60d supply, fill #0

## 2022-01-05 MED ORDER — FERROUS SULFATE 325 (65 FE) MG PO TABS
325.0000 mg | ORAL_TABLET | Freq: Every day | ORAL | 3 refills | Status: DC
  Filled 2022-01-05: qty 30, 30d supply, fill #0

## 2022-01-05 MED ORDER — SPIRONOLACTONE 100 MG PO TABS
100.0000 mg | ORAL_TABLET | Freq: Every day | ORAL | 1 refills | Status: DC
  Filled 2022-01-05: qty 90, 90d supply, fill #0

## 2022-01-05 MED ORDER — PROPRANOLOL HCL 10 MG PO TABS
10.0000 mg | ORAL_TABLET | Freq: Two times a day (BID) | ORAL | 1 refills | Status: DC
  Filled 2022-01-05: qty 180, 90d supply, fill #0

## 2022-01-05 NOTE — Patient Instructions (Addendum)
Flu vaccine was given  Screening labs obtained this visit  We will make an appointment for you for the financial counselor  Please avoid all alcohol and stay in your alcohol support group meetings  Return to [name] 2 months  Referral back to gastroenterology will be made  All medications refilled  Se puso la vacuna contra la gripe  Los laboratorios de deteccin obtuvieron esta visita.  Le programaremos una cita con el asesor financiero.  Evite todo el alcohol y [employer] reuniones de su grupo de apoyo al alcohol.  Regreso al [name] 2 meses  Se realizar remisin a gastroenterologa.  Todos los medicamentos reabastecidos

## 2022-01-06 ENCOUNTER — Encounter: Payer: Self-pay | Admitting: Critical Care Medicine

## 2022-01-06 ENCOUNTER — Other Ambulatory Visit: Payer: Self-pay

## 2022-01-06 LAB — PROTIME-INR
INR: 1.5 — ABNORMAL HIGH (ref 0.9–1.2)
Prothrombin Time: 15.6 s — ABNORMAL HIGH (ref 9.1–12.0)

## 2022-01-06 LAB — COMPREHENSIVE METABOLIC PANEL
ALT: 18 IU/L (ref 0–44)
AST: 41 IU/L — ABNORMAL HIGH (ref 0–40)
Albumin/Globulin Ratio: 0.9 — ABNORMAL LOW (ref 1.2–2.2)
Albumin: 3.1 g/dL — ABNORMAL LOW (ref 4.1–5.1)
Alkaline Phosphatase: 198 IU/L — ABNORMAL HIGH (ref 44–121)
BUN/Creatinine Ratio: 7 — ABNORMAL LOW (ref 9–20)
BUN: 5 mg/dL — ABNORMAL LOW (ref 6–24)
Bilirubin Total: 4.8 mg/dL — ABNORMAL HIGH (ref 0.0–1.2)
CO2: 21 mmol/L (ref 20–29)
Calcium: 8.3 mg/dL — ABNORMAL LOW (ref 8.7–10.2)
Chloride: 105 mmol/L (ref 96–106)
Creatinine, Ser: 0.75 mg/dL — ABNORMAL LOW (ref 0.76–1.27)
Globulin, Total: 3.3 g/dL (ref 1.5–4.5)
Glucose: 136 mg/dL — ABNORMAL HIGH (ref 70–99)
Potassium: 3.6 mmol/L (ref 3.5–5.2)
Sodium: 141 mmol/L (ref 134–144)
Total Protein: 6.4 g/dL (ref 6.0–8.5)
eGFR: 115 mL/min/{1.73_m2} (ref >59)

## 2022-01-06 LAB — CBC WITH DIFFERENTIAL/PLATELET
Basophils Absolute: 0 10*3/uL (ref 0.0–0.2)
Basos: 0 %
EOS (ABSOLUTE): 0 10*3/uL (ref 0.0–0.4)
Eos: 1 %
Hematocrit: 33.3 % — ABNORMAL LOW (ref 37.5–51.0)
Hemoglobin: 12.2 g/dL — ABNORMAL LOW (ref 13.0–17.7)
Immature Grans (Abs): 0 10*3/uL (ref 0.0–0.1)
Immature Granulocytes: 0 %
Lymphocytes Absolute: 0.8 10*3/uL (ref 0.7–3.1)
Lymphs: 32 %
MCH: 34.8 pg — ABNORMAL HIGH (ref 26.6–33.0)
MCHC: 36.6 g/dL — ABNORMAL HIGH (ref 31.5–35.7)
MCV: 95 fL (ref 79–97)
Monocytes Absolute: 0.3 10*3/uL (ref 0.1–0.9)
Monocytes: 10 %
Neutrophils Absolute: 1.4 10*3/uL (ref 1.4–7.0)
Neutrophils: 57 %
Platelets: 48 10*3/uL — CL (ref 150–450)
RBC: 3.51 x10E6/uL — ABNORMAL LOW (ref 4.14–5.80)
RDW: 12.3 % (ref 11.6–15.4)
WBC: 2.4 10*3/uL — CL (ref 3.4–10.8)

## 2022-01-06 NOTE — Assessment & Plan Note (Signed)
Finish course of ciprofloxacin

## 2022-01-06 NOTE — Assessment & Plan Note (Signed)
Continue with lactulose and finish course of ciprofloxacin

## 2022-01-06 NOTE — Assessment & Plan Note (Signed)
Reassess CBC 

## 2022-01-06 NOTE — Assessment & Plan Note (Signed)
Resolved continue lactulose

## 2022-01-06 NOTE — Progress Notes (Signed)
Let pt know all labs stable. Avoid alcohol

## 2022-01-06 NOTE — Assessment & Plan Note (Signed)
Improved status we will refill all medications

## 2022-01-07 ENCOUNTER — Telehealth (INDEPENDENT_AMBULATORY_CARE_PROVIDER_SITE_OTHER): Payer: Self-pay

## 2022-01-07 NOTE — Telephone Encounter (Signed)
Pt was called and is aware of results, DOB was confirmed.   Interpreter [phone]

## 2022-01-07 NOTE — Telephone Encounter (Signed)
-----   Message from [name], MD sent at 01/06/2022 11:24 AM EST ----- Let pt know all labs stable. Avoid alcohol

## 2022-01-12 ENCOUNTER — Other Ambulatory Visit: Payer: Self-pay

## 2022-01-17 ENCOUNTER — Other Ambulatory Visit: Payer: Self-pay

## 2022-01-25 ENCOUNTER — Other Ambulatory Visit: Payer: Self-pay

## 2022-02-08 ENCOUNTER — Other Ambulatory Visit: Payer: Self-pay

## 2022-02-19 ENCOUNTER — Encounter (HOSPITAL_COMMUNITY): Payer: Self-pay

## 2022-02-19 ENCOUNTER — Observation Stay (HOSPITAL_COMMUNITY)
Admission: EM | Admit: 2022-02-19 | Discharge: 2022-02-20 | Disposition: A | Discharge status: Home / Self Care | Payer: Self-pay | Attending: Family Medicine | Admitting: Family Medicine

## 2022-02-19 ENCOUNTER — Emergency Department (HOSPITAL_COMMUNITY): Payer: Self-pay

## 2022-02-19 ENCOUNTER — Other Ambulatory Visit: Payer: Self-pay

## 2022-02-19 DIAGNOSIS — F1010 Alcohol abuse, uncomplicated: Secondary | ICD-10-CM | POA: Diagnosis present

## 2022-02-19 DIAGNOSIS — E876 Hypokalemia: Secondary | ICD-10-CM | POA: Insufficient documentation

## 2022-02-19 DIAGNOSIS — D649 Anemia, unspecified: Secondary | ICD-10-CM | POA: Insufficient documentation

## 2022-02-19 DIAGNOSIS — K7030 Alcoholic cirrhosis of liver without ascites: Secondary | ICD-10-CM

## 2022-02-19 DIAGNOSIS — R7401 Elevation of levels of liver transaminase levels: Secondary | ICD-10-CM | POA: Insufficient documentation

## 2022-02-19 DIAGNOSIS — Z79899 Other long term (current) drug therapy: Secondary | ICD-10-CM | POA: Insufficient documentation

## 2022-02-19 DIAGNOSIS — K7460 Unspecified cirrhosis of liver: Secondary | ICD-10-CM | POA: Diagnosis present

## 2022-02-19 DIAGNOSIS — K429 Umbilical hernia without obstruction or gangrene: Secondary | ICD-10-CM

## 2022-02-19 DIAGNOSIS — K652 Spontaneous bacterial peritonitis: Secondary | ICD-10-CM | POA: Diagnosis present

## 2022-02-19 DIAGNOSIS — K7031 Alcoholic cirrhosis of liver with ascites: Principal | ICD-10-CM | POA: Insufficient documentation

## 2022-02-19 DIAGNOSIS — R601 Generalized edema: Secondary | ICD-10-CM | POA: Insufficient documentation

## 2022-02-19 DIAGNOSIS — D539 Nutritional anemia, unspecified: Secondary | ICD-10-CM

## 2022-02-19 DIAGNOSIS — E871 Hypo-osmolality and hyponatremia: Secondary | ICD-10-CM | POA: Insufficient documentation

## 2022-02-19 DIAGNOSIS — K7290 Hepatic failure, unspecified without coma: Secondary | ICD-10-CM | POA: Diagnosis present

## 2022-02-19 LAB — BASIC METABOLIC PANEL
Anion gap: 6 (ref 5–15)
BUN: <5 mg/dL — ABNORMAL LOW (ref 6–20)
CO2: 21 mmol/L — ABNORMAL LOW (ref 22–32)
Calcium: 7.7 mg/dL — ABNORMAL LOW (ref 8.9–10.3)
Chloride: 106 mmol/L (ref 98–111)
Creatinine, Ser: 0.75 mg/dL (ref 0.61–1.24)
GFR, Estimated: >60 mL/min (ref >60)
Glucose, Bld: 103 mg/dL — ABNORMAL HIGH (ref 70–99)
Potassium: 3.1 mmol/L — ABNORMAL LOW (ref 3.5–5.1)
Sodium: 133 mmol/L — ABNORMAL LOW (ref 135–145)

## 2022-02-19 LAB — URINALYSIS, ROUTINE W REFLEX MICROSCOPIC
Glucose, UA: NEGATIVE mg/dL
Ketones, ur: NEGATIVE mg/dL
Nitrite: NEGATIVE
Protein, ur: 30 mg/dL — AB
Specific Gravity, Urine: 1.016 (ref 1.005–1.030)
pH: 6 (ref 5.0–8.0)

## 2022-02-19 LAB — CBC WITH DIFFERENTIAL/PLATELET
Abs Immature Granulocytes: 0.02 10*3/uL (ref 0.00–0.07)
Basophils Absolute: 0 10*3/uL (ref 0.0–0.1)
Basophils Relative: 1 %
Eosinophils Absolute: 0.1 10*3/uL (ref 0.0–0.5)
Eosinophils Relative: 2 %
HCT: 29.7 % — ABNORMAL LOW (ref 39.0–52.0)
Hemoglobin: 10.6 g/dL — ABNORMAL LOW (ref 13.0–17.0)
Immature Granulocytes: 1 %
Lymphocytes Relative: 14 %
Lymphs Abs: 0.6 10*3/uL — ABNORMAL LOW (ref 0.7–4.0)
MCH: 35.1 pg — ABNORMAL HIGH (ref 26.0–34.0)
MCHC: 35.7 g/dL (ref 30.0–36.0)
MCV: 98.3 fL (ref 80.0–100.0)
Monocytes Absolute: 0.5 10*3/uL (ref 0.1–1.0)
Monocytes Relative: 12 %
Neutro Abs: 3 10*3/uL (ref 1.7–7.7)
Neutrophils Relative %: 70 %
Platelets: 45 10*3/uL — ABNORMAL LOW (ref 150–400)
RBC: 3.02 MIL/uL — ABNORMAL LOW (ref 4.22–5.81)
RDW: 13.7 % (ref 11.5–15.5)
WBC: 4.1 10*3/uL (ref 4.0–10.5)
nRBC: 0 % (ref 0.0–0.2)

## 2022-02-19 LAB — BODY FLUID CELL COUNT WITH DIFFERENTIAL
Eos, Fluid: 0 %
Lymphs, Fluid: 46 %
Monocyte-Macrophage-Serous Fluid: 31 % — ABNORMAL LOW (ref 50–90)
Neutrophil Count, Fluid: 23 % (ref 0–25)
Total Nucleated Cell Count, Fluid: 88 cu mm (ref 0–1000)

## 2022-02-19 LAB — COMPREHENSIVE METABOLIC PANEL
ALT: 39 U/L (ref 0–44)
AST: 69 U/L — ABNORMAL HIGH (ref 15–41)
Albumin: 2.1 g/dL — ABNORMAL LOW (ref 3.5–5.0)
Alkaline Phosphatase: 108 U/L (ref 38–126)
Anion gap: 8 (ref 5–15)
BUN: <5 mg/dL — ABNORMAL LOW (ref 6–20)
CO2: 18 mmol/L — ABNORMAL LOW (ref 22–32)
Calcium: 7.8 mg/dL — ABNORMAL LOW (ref 8.9–10.3)
Chloride: 102 mmol/L (ref 98–111)
Creatinine, Ser: 0.65 mg/dL (ref 0.61–1.24)
GFR, Estimated: >60 mL/min (ref >60)
Glucose, Bld: 115 mg/dL — ABNORMAL HIGH (ref 70–99)
Potassium: 2.8 mmol/L — ABNORMAL LOW (ref 3.5–5.1)
Sodium: 128 mmol/L — ABNORMAL LOW (ref 135–145)
Total Bilirubin: 5.1 mg/dL — ABNORMAL HIGH (ref 0.3–1.2)
Total Protein: 5.5 g/dL — ABNORMAL LOW (ref 6.5–8.1)

## 2022-02-19 LAB — GRAM STAIN

## 2022-02-19 LAB — PROTIME-INR
INR: 2 — ABNORMAL HIGH (ref 0.8–1.2)
Prothrombin Time: 22.9 seconds — ABNORMAL HIGH (ref 11.4–15.2)

## 2022-02-19 LAB — LIPASE, BLOOD: Lipase: 65 U/L — ABNORMAL HIGH (ref 11–51)

## 2022-02-19 LAB — LACTIC ACID, PLASMA
Lactic Acid, Venous: 2.3 mmol/L (ref 0.5–1.9)
Lactic Acid, Venous: 1.5 mmol/L (ref 0.5–1.9)

## 2022-02-19 MED ORDER — LIDOCAINE HCL (PF) 1 % IJ SOLN
5.0000 mL | Freq: Once | INTRAMUSCULAR | Status: DC
  Filled 2022-02-19: qty 5

## 2022-02-19 MED ORDER — FUROSEMIDE 10 MG/ML IJ SOLN: 20.0000 mg | Freq: Once | INTRAMUSCULAR | Status: DC

## 2022-02-19 MED ORDER — PANTOPRAZOLE SODIUM 40 MG PO TBEC
40.0000 mg | DELAYED_RELEASE_TABLET | Freq: Two times a day (BID) | ORAL | Status: DC
  Administered 2022-02-20: 40 mg via ORAL
  Filled 2022-02-19 (×2): qty 1

## 2022-02-19 MED ORDER — FERROUS SULFATE 325 (65 FE) MG PO TABS
325.0000 mg | ORAL_TABLET | Freq: Every day | ORAL | Status: DC
  Administered 2022-02-20: 325 mg via ORAL
  Filled 2022-02-19: qty 1

## 2022-02-19 MED ORDER — CIPROFLOXACIN HCL 500 MG PO TABS
500.0000 mg | ORAL_TABLET | Freq: Every day | ORAL | Status: DC
  Administered 2022-02-19 – 2022-02-20 (×2): 500 mg via ORAL
  Filled 2022-02-19 (×2): qty 1

## 2022-02-19 MED ORDER — POTASSIUM CHLORIDE 10 MEQ/100ML IV SOLN
10.0000 meq | INTRAVENOUS | Status: AC
  Administered 2022-02-19 (×3): 10 meq via INTRAVENOUS
  Filled 2022-02-19 (×3): qty 100

## 2022-02-19 MED ORDER — FUROSEMIDE 40 MG PO TABS
40.0000 mg | ORAL_TABLET | Freq: Every day | ORAL | Status: DC
  Administered 2022-02-20: 40 mg via ORAL
  Filled 2022-02-19: qty 1

## 2022-02-19 MED ORDER — POTASSIUM CHLORIDE 10 MEQ/100ML IV SOLN
10.0000 meq | INTRAVENOUS | Status: AC
  Administered 2022-02-19 (×2): 10 meq via INTRAVENOUS
  Filled 2022-02-19 (×2): qty 100

## 2022-02-19 MED ORDER — PROPRANOLOL HCL 10 MG PO TABS
10.0000 mg | ORAL_TABLET | Freq: Two times a day (BID) | ORAL | Status: DC
  Administered 2022-02-19 – 2022-02-20 (×2): 10 mg via ORAL
  Filled 2022-02-19 (×2): qty 1

## 2022-02-19 MED ORDER — THIAMINE MONONITRATE 100 MG PO TABS
100.0000 mg | ORAL_TABLET | Freq: Every day | ORAL | Status: DC
  Administered 2022-02-19 – 2022-02-20 (×2): 100 mg via ORAL
  Filled 2022-02-19 (×4): qty 1

## 2022-02-19 MED ORDER — FOLIC ACID 1 MG PO TABS
1.0000 mg | ORAL_TABLET | Freq: Every day | ORAL | Status: DC
  Administered 2022-02-19 – 2022-02-20 (×2): 1 mg via ORAL
  Filled 2022-02-19 (×2): qty 1

## 2022-02-19 MED ORDER — POTASSIUM CHLORIDE CRYS ER 20 MEQ PO TBCR
40.0000 meq | EXTENDED_RELEASE_TABLET | Freq: Once | ORAL | Status: DC
  Filled 2022-02-19: qty 2

## 2022-02-19 MED ORDER — SPIRONOLACTONE 25 MG PO TABS
100.0000 mg | ORAL_TABLET | Freq: Every day | ORAL | Status: DC
  Administered 2022-02-19 – 2022-02-20 (×2): 100 mg via ORAL
  Filled 2022-02-19 (×2): qty 4

## 2022-02-19 MED ORDER — FUROSEMIDE 10 MG/ML IJ SOLN
60.0000 mg | Freq: Once | INTRAMUSCULAR | Status: AC
  Administered 2022-02-19: 60 mg via INTRAVENOUS
  Filled 2022-02-19: qty 6

## 2022-02-19 MED ORDER — PANTOPRAZOLE SODIUM 40 MG PO TBEC: 40.0000 mg | DELAYED_RELEASE_TABLET | Freq: Every day | ORAL | Status: DC

## 2022-02-19 MED ORDER — POTASSIUM CHLORIDE CRYS ER 20 MEQ PO TBCR
80.0000 meq | EXTENDED_RELEASE_TABLET | Freq: Once | ORAL | Status: AC
  Administered 2022-02-19: 80 meq via ORAL
  Filled 2022-02-19: qty 4

## 2022-02-19 MED ORDER — LACTULOSE 10 GM/15ML PO SOLN
30.0000 g | Freq: Four times a day (QID) | ORAL | Status: DC
  Administered 2022-02-19 – 2022-02-20 (×3): 30 g via ORAL
  Filled 2022-02-19 (×3): qty 60

## 2022-02-19 MED ORDER — ADULT MULTIVITAMIN W/MINERALS CH
1.0000 | ORAL_TABLET | Freq: Every day | ORAL | Status: DC
  Administered 2022-02-19 – 2022-02-20 (×2): 1 via ORAL
  Filled 2022-02-19 (×2): qty 1

## 2022-02-19 NOTE — ED Provider Notes (Addendum)
[facility] Provider Note   CSN: [id number] Arrival date & time: 02/19/22  1057     History  Chief Complaint  Patient presents with   Abdominal Pain    Mathew [name] is a 42 y.o. male.  Patient here with abdominal swelling.  History of alcoholic liver cirrhosis.  Has not been taking his medications for the last 10 days or so.  He has not had paracentesis in a while.  Has been drinking alcohol again.  Denies any fevers or chills.  Feels very uncomfortable.  Has had a lot of leg swelling as well these last few days.  Nothing makes it worse or better.  Denies any chest pain.  The history is provided by the patient.       Home Medications Prior to Admission medications   Medication Sig Start Date End Date Taking? Authorizing Provider  acetaminophen (TYLENOL) 325 MG tablet Take 2 tablets (650 mg total) by mouth every 6 (six) hours as needed for mild pain (or Fever >/= 101). Patient not taking: Reported on 01/05/2022 11/18/21   Jacelyn Grip, MD  ciprofloxacin (CIPRO) 500 MG tablet Take 1 tablet (500 mg total) by mouth daily. 11/21/21   Jacelyn Grip, MD  ferrous sulfate 325 (65 FE) MG tablet Take 1 tablet (325 mg total) by mouth daily with breakfast. 01/05/22   [name], MD  folic acid (FOLVITE) 1 MG tablet Take 1 tablet (1 mg total) by mouth daily. 01/05/22 01/05/23  [name], MD  furosemide (LASIX) 40 MG tablet Take 1 tablet (40 mg total) by mouth daily. 01/05/22   [name], MD  lactulose (CHRONULAC) 10 GM/15ML solution Take 45 mLs (30 g total) by mouth every 6 (six) hours. 01/05/22   [name], MD  Multiple Vitamin (MULTIVITAMIN WITH MINERALS) TABS tablet Take 1 tablet by mouth daily. 08/25/21   [name], MD  nicotine (NICODERM CQ - DOSED IN MG/24 HOURS) 14 mg/24hr patch Place 1 patch (14 mg total) onto the skin daily. Patient not taking: Reported on 01/05/2022 11/09/21   Thurnell Lose,  MD  pantoprazole (PROTONIX) 40 MG tablet Take 1 tablet (40 mg total) by mouth daily at 6 (six) AM. 01/05/22   [name], MD  propranolol (INDERAL) 10 MG tablet Take 1 tablet (10 mg total) by mouth 2 (two) times daily. 01/05/22   [name], MD  spironolactone (ALDACTONE) 100 MG tablet Take 1 tablet (100 mg total) by mouth daily. 01/05/22   [name], MD  thiamine (VITAMIN B1) 100 MG tablet Take 1 tablet (100 mg total) by mouth daily. 01/05/22   [name], MD      Allergies    Patient has no known allergies.    Review of Systems   Review of Systems  Physical Exam Updated Vital Signs BP (!) 141/76 (BP Location: Right Arm)   Pulse (!) 117   Temp 99.4 F (37.4 C) (Oral)   Resp (!) 26   Ht 5' 1"$  (1.549 m)   Wt 79.8 kg   SpO2 97%   BMI 33.25 kg/m  Physical Exam Vitals and nursing note reviewed.  Constitutional:      General: He is not in acute distress.    Appearance: He is well-developed.  HENT:     Head: Normocephalic and atraumatic.  Eyes:     Conjunctiva/sclera: Conjunctivae normal.  Cardiovascular:     Rate and Rhythm:  Normal rate and regular rhythm.     Heart sounds: No murmur heard. Pulmonary:     Effort: Pulmonary effort is normal. No respiratory distress.     Breath sounds: Normal breath sounds.  Abdominal:     General: Abdomen is protuberant. There is distension.     Palpations: Abdomen is soft.     Tenderness: There is generalized abdominal tenderness.     Hernia: A hernia (reducible umbilical hernia) is present.     Comments: Stigmata of cirrhosis changes to his abdomen  Musculoskeletal:        General: No swelling.     Cervical back: Neck supple.  Skin:    General: Skin is warm and dry.     Capillary Refill: Capillary refill takes less than 2 seconds.     Coloration: Skin is jaundiced.     Comments: Bilateral pitting edema in his legs  Neurological:     General: No focal deficit present.     Mental Status: He is alert.   Psychiatric:        Mood and Affect: Mood normal.     ED Results / Procedures / Treatments   Labs (all labs ordered are listed, but only abnormal results are displayed) Labs Reviewed  CBC WITH DIFFERENTIAL/PLATELET - Abnormal; Notable for the following components:      Result Value   RBC 3.02 (*)    Hemoglobin 10.6 (*)    HCT 29.7 (*)    MCH 35.1 (*)    Platelets 45 (*)    Lymphs Abs 0.6 (*)    All other components within normal limits  COMPREHENSIVE METABOLIC PANEL - Abnormal; Notable for the following components:   Sodium 128 (*)    Potassium 2.8 (*)    CO2 18 (*)    Glucose, Bld 115 (*)    BUN <5 (*)    Calcium 7.8 (*)    Total Protein 5.5 (*)    Albumin 2.1 (*)    AST 69 (*)    Total Bilirubin 5.1 (*)    All other components within normal limits  LIPASE, BLOOD - Abnormal; Notable for the following components:   Lipase 65 (*)    All other components within normal limits  URINALYSIS, ROUTINE W REFLEX MICROSCOPIC - Abnormal; Notable for the following components:   Color, Urine AMBER (*)    APPearance HAZY (*)    Hgb urine dipstick MODERATE (*)    Bilirubin Urine MODERATE (*)    Protein, ur 30 (*)    Leukocytes,Ua SMALL (*)    Bacteria, UA RARE (*)    All other components within normal limits  PROTIME-INR - Abnormal; Notable for the following components:   Prothrombin Time 22.9 (*)    INR 2.0 (*)    All other components within normal limits  LACTIC ACID, PLASMA - Abnormal; Notable for the following components:   Lactic Acid, Venous 2.3 (*)    All other components within normal limits  GRAM STAIN  CULTURE, BODY FLUID W GRAM STAIN -BOTTLE  CULTURE, BLOOD (ROUTINE X 2)  CULTURE, BLOOD (ROUTINE X 2)  LACTIC ACID, PLASMA  BODY FLUID CELL COUNT WITH DIFFERENTIAL    EKG EKG Interpretation  Date/Time:  Saturday February 19 2022 11:13:00 EST Ventricular Rate:  110 PR Interval:  132 QRS Duration: 98 QT Interval:  380 QTC Calculation: 514 R Axis:   -20 Text  Interpretation: Sinus tachycardia Confirmed by Ronnald Nian, Rakesha Dalporto ([id number]) on 02/19/2022 11:19:10 AM  Radiology No results  found.  Procedures Procedures    Medications Ordered in ED Medications  lidocaine (PF) (XYLOCAINE) 1 % injection 5 mL (has no administration in time range)    ED Course/ Medical Decision Making/ A&P                             Medical Decision Making Amount and/or Complexity of Data Reviewed Labs: ordered.  Risk Decision regarding hospitalization.   Mathew [name] is here with abdominal pain.  History of alcoholic cirrhosis.  Has been drinking again and not taking his medications.  Patient arrives unremarkable vitals.  He is got distention of his abdomen with stigmata of cirrhosis.  He is got jaundice.  Very significant swelling to his legs.  Overall my suspicion is for volume overload in the setting of alcoholic cirrhosis with noncompliance.  Will get paracentesis to evaluate for SBP and basic labs but will likely need to admit to get him back on medical optimization given that he is grossly anasarcic.  He has been following with [facility] in the past.  Per my review and interpretation of labs no significant leukocytosis or anemia.  Potassium is 2.8 and will replete.  Bilirubin is 5.1.  INR is 2.  Lactic acid 2.3.  I have sent blood cultures but at this time he does not meet sepsis criteria.  Ascites fluid still to be analyzed.  May need to cover for SBP if body cell count shows consistency with.  Pain has improved but he still has significant anasarca on exam.  Has not been compliant with his medications.  Feel like he needs observation stay for medical optimization and social help.  Will admit to medicine for further care.  Patient had about 4500 cc of fluid removed during paracentesis.  This chart was dictated using voice recognition software.  Despite best efforts to proofread,  errors can occur which can change the documentation meaning.         Final  Clinical Impression(s) / ED Diagnoses Final diagnoses:  Anasarca  Alcoholic cirrhosis of liver with ascites [facility])    Rx / DC Orders ED Discharge Orders     None         Lennice Sites, DO 02/19/22 1335    Lennice Sites, DO 02/19/22 1336

## 2022-02-19 NOTE — Assessment & Plan Note (Addendum)
Patient reports hx of 4-5 40 oz beers a day back in November and reports only drinking 12 oz beer x 1 every 4-5 days, last drink 2/8. CIWA 0>4 overnight (scored for N/V which he reports is from the lactulose).  - Continue to monitor CIWA q8h without Ativan - Thiamine, MVI, folate daily  - TOC for substance use resources

## 2022-02-19 NOTE — ED Triage Notes (Signed)
Hx of liver cirrhosis and has not been taking his meds he lost them.  Reports abdominal swelling x 3 weeks.  Patient is jaundice.

## 2022-02-19 NOTE — Assessment & Plan Note (Signed)
Admission labs notable for AST elevated to 69, nearly twice that of ALT at 39. Likely 2/2 alcohol use.  - F/u AM CMP

## 2022-02-19 NOTE — ED Notes (Signed)
Patient transported to Ultrasound 

## 2022-02-19 NOTE — Progress Notes (Signed)
Daily Progress Note Intern Pager: [phone]  Patient name: Mathew [name] Mathew [name] Medical record number: [id number] Date of birth: [birth year] Age: 42 y.o. Gender: male  Primary Care Provider: [name], MD Consultants: GI  Code Status: FULL CODE   Pt Overview and Major Events to Date:  2/10: Admitted  Assessment and Plan: Mathew [name] Mathew [name] [name] is a 43 y.o. male who was admitted for decompensated alcoholic cirrhosis. Pertinent PMH/PSH includes alcohol use disorder.   Decompensated hepatic cirrhosis (HCC) MELD-Na 26, 14-15% 90-day mortality Child-Pugh Class C with life expectancy 1-3 years. S/p paracentesis with 4.5L removed 2/10. No encephalopathy and without SBP. GI consulted, appreciate care and recommendations.  -GI consult, appreciate recs  -Continue cipro 500 mg daily for SBP PPX -Restart home Lasix 40 mg, spironolactone 100 mg daily   -AM CMP, CBC, INR/PTT -Hold DVT ppx due to thrombocytopenia -Continue propranolol 10 mg (esophageal variceal ppx) -Continue lactulose 30 g q6h -Protonix 40 mg, MVI   Hypokalemia Received aggressive repletion on admission given hypokalemia. Labs still pending this morning. -F/u labs, replete as needed   Hyponatremia Morning labs still pending. Admission Na 128. Patient considerably volume overloaded in his anasarca state, w/ decompensated liver cirrhosis likely etiology of Na derangement.  -AM CMP  Anemia Admission Hgb 10.6, chronically low, w/ MCV 98.3. Moderate Hgb noted on UA. Anemia likely 2/2 to cirrhosis > low INR, and possibly loss in urine.   Transaminitis Admission labs notable for AST elevated to 69, nearly twice that of ALT at 39. Likely 2/2 alcohol use.  - F/u AM CMP  ETOH abuse Patient reports hx of 4-5 40 oz beers a day back in November and reports only drinking 12 oz beer x 1 every 4-5 days, last drink 2/8. CIWA 0>4 overnight (scored for N/V which he reports is from the lactulose).  - Continue to monitor CIWA  q8h without Ativan - Thiamine, MVI, folate daily  - TOC for substance use resources   FEN/GI: Regular PPx: SCD given thrombocytopenia Dispo:Home pending clinical improvement . Barriers include GI recommendations, diuresis.   Subjective:  [name] feels much improved this morning. He denies any abdominal pain. States that he had run out of his medications at home. He does not routinely get paracentesis. He reports that he has slowed down his drinking significantly. He only drinks one 12 oz beer every 5-6 days to calm down the cravings.   Objective: Temp:  [97.8 F (36.6 C)-99.4 F (37.4 C)] 98 F (36.7 C) (02/11 0400) Pulse Rate:  [88-117] 88 (02/11 0400) Resp:  [16-26] 20 (02/11 0400) BP: (100-141)/(59-83) 100/63 (02/11 0400) SpO2:  [94 %-100 %] 95 % (02/11 0400) Weight:  [79.8 kg] 79.8 kg (02/10 1108) Physical Exam: General: Chronically ill appearing, scleral icterus, jaundice   Cardiovascular: RRR  Respiratory: CTAB without wheezing/rhonchi/rales Abdomen: Distended, umbilical hernia that is reducible but ttp, +fluid wave Extremities: Non-pitting edema b/l  Laboratory: Most recent CBC Lab Results  Component Value Date   WBC 4.1 02/19/2022   HGB 10.6 (L) 02/19/2022   HCT 29.7 (L) 02/19/2022   MCV 98.3 02/19/2022   PLT 45 (L) 02/19/2022   Most recent BMP    Latest Ref Rng & Units 02/19/2022    9:37 PM  BMP  Glucose 70 - 99 mg/dL 147   BUN 6 - 20 mg/dL <5   Creatinine 8.29 - 1.24 mg/dL 5.62   Sodium 130 - 865 mmol/L 133   Potassium 3.5 - 5.1  mmol/L 3.1   Chloride 98 - 111 mmol/L 106   CO2 22 - 32 mmol/L 21   Calcium 8.9 - 10.3 mg/dL 7.7    Other pertinent labs: INR pending   Imaging/Diagnostic Tests: None new  Mathew Dick, DO 02/20/2022, 7:35 AM  PGY-3, [facility] FPTS Intern pager: [phone], text pages welcome Secure chat group [facility] Teaching Service

## 2022-02-19 NOTE — Assessment & Plan Note (Addendum)
Morning labs still pending. Admission Na 128. Patient considerably volume overloaded in his anasarca state, w/ decompensated liver cirrhosis likely etiology of Na derangement.  -AM CMP

## 2022-02-19 NOTE — ED Notes (Signed)
Pt back from us

## 2022-02-19 NOTE — Assessment & Plan Note (Addendum)
MELD-Na 26, 14-15% 90-day mortality Child-Pugh Class C with life expectancy 1-3 years. S/p paracentesis with 4.5L removed 2/10. No encephalopathy and without SBP. GI consulted, appreciate care and recommendations.  -GI consult, appreciate recs  -Continue cipro 500 mg daily for SBP PPX -Restart home Lasix 40 mg, spironolactone 100 mg daily   -AM CMP, CBC, INR/PTT -Hold DVT ppx due to thrombocytopenia -Continue propranolol 10 mg (esophageal variceal ppx) -Continue lactulose 30 g q6h -Protonix 40 mg, MVI

## 2022-02-19 NOTE — Hospital Course (Signed)
Mathew [name] Mathew [name] Mathew [name] is a 42 y.o. male who was admitted to [facility] on 2/11 for acute deep compensated hepatic cirrhosis.  His hospital course is as below by problem, refer to the H&P for additional information  Decompensated hepatic cirrhosis Patient presented with anasarca and jaundice.  Exacerbation due to medication nonadherence.  Labs on admission were significant for sodium 128, potassium 2.8, AST 69, total bilirubin 5.1, platelets 45, INR 2.  He had a paracentesis with 4.5 L removed.  SBP was ruled out given negative Gram stain and cell count 31. Not encephalopathic. He was started on ciprofloxacin for SBP prophylaxis.  He received a dose of IV Lasix.  GI was consulted.  He was started back on his home regimen of Lasix 40 mg, spironolactone 100 mg, propranolol 10 mg, lactulose 30 g every 6 hours, Protonix 40 mg.   EtOH abuse Reported last drink was on 2/8, drink 12 ounces of beer.  CIWA's were ordered, patient did not require any benzodiazepines.  TOC was consulted for substance use resources.  Other conditions chronic and stable.  PCP Issues for follow up: Ensure taking medications appropriately May need to be scheduled for routine paracentesis outpt  F/u with GI outpt  Continue discussing importance of alcohol cessation, assist with medications if amenable  Recheck labs: CBC, CMP, INR

## 2022-02-19 NOTE — H&P (Cosign Needed Addendum)
[name] Admission History and Physical Service Pager: [phone]  Patient name: Mathew [name] Mathew [name] Medical record number: [id number] Date of Birth: Aug 01, 1980 Age: 42 y.o. Gender: male  Primary Care Provider: [name], MD Consultants: GI Code Status: Full Code Preferred Emergency Contact:  Mathew [name] [phone] - daughter Mathew [name] - brother  Chief Complaint: Anasarca  Assessment and Plan: Belton Altamira [name] [name] is a 42 y.o. male presenting with leg and abdominal swelling, concerning for anasarca, w/ jaundice and hx of alcoholic cirrhosis. Differential for presentation of this includes decompensated cirrhosis, also considered CHF and renal failure but less likely given Hx, exam, and normal renal function and echo from 10/23 showing EF 65-70% w/ G1DD.   Decompensated hepatic cirrhosis ([place]) Patient presents w/ anasarca, and jaundice, 2/2 to poor medication adherence. Patient has been admitted for decompensated cirrhosis multiple times in the last year. Labs on admission significant for Na 128, K 2.8, AST 69, Total Bili 5.1, lactate 2.3 > 1.5, Hg 10.6, PLT 45, INR 2. Patient is s/p 4.5 L diagnostic paracentesis w/ negative gram stain, and cell count 31, and 60 mg IV lasix x1.  Will plan for diuresis and restarting SBP PPX. GI to see tomorrow. -Admit to FPTS, attending Dr. Leda Quail -GI consult, appreciate recs  -Cipro 500 mg daily for SBP PPX -Consider re-dose lasix in am -AM CMP, CBC, INR/PTT -Hold DVT ppx for PLT 45 and INR 2 -Thiamine, Folic acid, ferrous sulfate  -Spironolactone 100 mg, propranolol 10 mg (esophageal variceal ppx), lactulose 30 g q6h -IV K 40 meq, Oral K tab 80 meq -Protonix 40 mg, MVI   Hypokalemia Patient presents w/ K of 2.8 on admission. Likley 2/2 cirrhosis. -Replete w/ 40 meq IV, 80 meq PO tab -Am CMP  Hyponatremia Patient presents w/ Na 128, asymptomatic. Patient considerably volume overloaded in his anasarca state, w/  decompensated liver cirrhosis likely etiology of Na derangement. Will continue to monitor.  -AM CMP  Anemia Patient presents w/ Hgb 10.6, chronically low, w/ MCV 98.3. Moderate Hgb noted on UA. Anemia likely 2/2 to cirrhosis > low INR, and possibly loss in urine.   Transaminitis AST elevated to 69, nearly twice that of ALT at 39. Likely 2/2 alcohol use.   ETOH abuse Patient reports hx of 4-5 40 oz beers a day back in November and reports only drinking 12 oz beer x 1 every 4-5 days. Patient has been admitted multiple times in [id number] for alcoholic cirrhosis. Reports his last drink was 2 days ago. Will monitor CIWA.  -CIWAs   Chronic Stable Conditions Thrombocytopenia - PLT 45, and have remained under 100 since 2018.   FEN/GI: Regular Diet VTE Prophylaxis: None, PLT 45, INR 2  Disposition: Progressive  History of Present Illness:  Mathew [name] [name] [name] is a 42 y.o. male presenting with anasarca.  Patient notes that his feet and belly are very swollen, and it started about 3 weeks ago. He also notes that he has dyspnea when laying flat, and yellowing of his eyes. Notes that he doesn't take meds for swelling and only has been taking lactulose for his liver. He is aware of his alcoholic cirrhosis, and notes drinking 1 12 0z beer every 5-6 days. He was in his usual state of health before this and denies any fever, chills, CP, or sick symptoms, but did note that he vomited twice 3 days ago. He does note his stomach was hurting when he came to the ED, but has  since resolved with his paracentesis (see below).   Patient was admitted to the [name] service in November, for alcoholic cirrhosis w/ SBP. He had an MRCP at that time that demonstrated no blockages and SMV thrombosis. Thrombosis was treated w/ supportive care. He also was admitted in October for AMS, 2/2 hepatic encephalopathy, and in August for alcoholic cirrhosis. Patient reports that prior to November admission, he reports he was drinking 4-5 40  oz beers in a day.    In the ED, patient w/ Na 128, K 2.8, bilirubin 5.1, INR 2, lactic acid 2.3 >1.5. He was treated w/ 60 mg IV lasix and had diagnostic paracentesis of 4.5 L that was negative for SPB concern.    Review Of Systems: Per HPI with the following additions: see above  Pertinent Past Medical History: Cirrhosis  Remainder reviewed in history tab.   Pertinent Past Surgical History: No   Remainder reviewed in history tab.  Pertinent Social History: Tobacco use: Yes, smokes 2-3 cigarrets a day, Started at 42 yo Alcohol use: day before yesterday, 12 oz beer Other Substance use: No Lives with a friend  Pertinent Family History: Mom - DM   Remainder reviewed in history tab.   Important Outpatient Medications: Lactulose  Remainder reviewed in medication history.   Objective: BP 119/61 (BP Location: Left Arm)   Pulse 97   Temp 98.2 F (36.8 C) (Oral)   Resp 17   Ht 5' 1"$  (1.549 m)   Wt 79.8 kg   SpO2 100%   BMI 33.25 kg/m  Exam: General: Jaundiced, conversant, NAD Eyes: Jaundiced Cardiovascular: RRR, NRMG Respiratory: CTABL Gastrointestinal: Minimally distended w/ shifting dullness.  MSK: 2-3+ pitting edema to thighs Neuro: Baseline mentation  Labs:  CBC BMET  Recent Labs  Lab 02/19/22 1235  WBC 4.1  HGB 10.6*  HCT 29.7*  PLT 45*   Recent Labs  Lab 02/19/22 1235  NA 128*  K 2.8*  CL 102  CO2 18*  BUN <5*  CREATININE 0.65  GLUCOSE 115*  CALCIUM 7.8*    Pertinent additional labs  Body Fluid cell count: 31, Gram stain negative, culture pending   EKG: My own interpretation (not copied from electronic read) Sinus Tachycardia to 110, Qtc [place], MD 02/19/2022, 6:37 PM PGY-2, [place] Intern pager: [phone], text pages welcome Secure chat group [place]

## 2022-02-19 NOTE — ED Notes (Signed)
..ED TO INPATIENT HANDOFF REPORT  ED Nurse Name and Phone #:  [phone]   S Name/Age/Gender [place] 42 y.o. male Room/Bed: 008C/008C  Code Status   Code Status: Prior  Home/SNF/Other Home Patient oriented to: self, place, time, and situation Is this baseline? Yes   Triage Complete: Triage complete  Chief Complaint Cirrhosis ([place]) [K74.60]  Triage Note Hx of liver cirrhosis and has not been taking his meds he lost them.  Reports abdominal swelling x 3 weeks.  Patient is jaundice.     Allergies No Known Allergies  Level of Care/Admitting Diagnosis ED Disposition     ED Disposition  Admit   Condition  --   Comment  Hospital Area: [place] [[id number]]  Level of Care: Progressive [102]  Admit to Progressive based on following criteria: GI, ENDOCRINE disease patients with GI bleeding, acute liver failure or pancreatitis, stable with diabetic ketoacidosis or thyrotoxicosis (hypothyroid) state.  Admit to Progressive based on following criteria: MULTISYSTEM THREATS such as stable sepsis, metabolic/electrolyte imbalance with or without encephalopathy that is responding to early treatment.  May place patient in observation at [facility] or [place] if equivalent level of care is available:: No  Covid Evaluation: Asymptomatic - no recent exposure (last 10 days) testing not required  Diagnosis: Cirrhosis [facility]) [id number]  Admitting Physician: Lenoria Chime [id number]  Attending Physician: Lenoria Chime [id number]          B Medical/Surgery History Past Medical History:  Diagnosis Date   Acute metabolic encephalopathy [id number]   Alcohol withdrawal seizure ([place])    Cirrhosis ([place])    ETOH abuse    Pneumonia 04/02/2020   SBP (spontaneous bacterial peritonitis) ([place]) 06/17/2021   Past Surgical History:  Procedure Laterality Date   COLONOSCOPY WITH PROPOFOL N/A 03/17/2018   Procedure: COLONOSCOPY WITH PROPOFOL;  Surgeon: Ronald Lobo, MD;  Location: [facility];  Service: Endoscopy;  Laterality: N/A;   ESOPHAGOGASTRODUODENOSCOPY (EGD) WITH PROPOFOL N/A 03/17/2018   Procedure: ESOPHAGOGASTRODUODENOSCOPY (EGD) WITH PROPOFOL;  Surgeon: Ronald Lobo, MD;  Location: [facility];  Service: Endoscopy;  Laterality: N/A;   IR PARACENTESIS  03/14/2018   IR PARACENTESIS  11/18/2020   IR PARACENTESIS  06/17/2021   IR PARACENTESIS  08/30/2021   IR PARACENTESIS  09/02/2021   IR PARACENTESIS  11/16/2021     A IV Location/Drains/Wounds Patient Lines/Drains/Airways Status     Active Line/Drains/Airways     Name Placement date Placement time Site Days   Peripheral IV 02/19/22 20 G Anterior;Right Forearm 02/19/22  1518  Forearm  less than 1            Intake/Output Last 24 hours No intake or output data in the 24 hours ending 02/19/22 1533  Labs/Imaging Results for orders placed or performed during the hospital encounter of 02/19/22 (from the past 48 hour(s))  Urinalysis, Routine w reflex microscopic -Urine, Clean Catch     Status: Abnormal   Collection Time: 02/19/22 10:59 AM  Result Value Ref Range   Color, Urine AMBER (A) YELLOW    Comment: BIOCHEMICALS MAY BE AFFECTED BY COLOR   APPearance HAZY (A) CLEAR   Specific Gravity, Urine 1.016 1.005 - 1.030   pH 6.0 5.0 - 8.0   Glucose, UA NEGATIVE NEGATIVE mg/dL   Hgb urine dipstick MODERATE (A) NEGATIVE   Bilirubin Urine MODERATE (A) NEGATIVE   Ketones, ur NEGATIVE NEGATIVE mg/dL   Protein, ur 30 (A) NEGATIVE mg/dL   Nitrite NEGATIVE NEGATIVE  Leukocytes,Ua SMALL (A) NEGATIVE   RBC / HPF 21-50 0 - 5 RBC/hpf   WBC, UA 21-50 0 - 5 WBC/hpf   Bacteria, UA RARE (A) NONE SEEN   Squamous Epithelial / HPF 6-10 0 - 5 /HPF   Mucus PRESENT    Hyaline Casts, UA PRESENT     Comment: Performed at [facility], [place] [address], [place], [place] [address]  Body fluid cell count with differential     Status: Abnormal   Collection Time: 02/19/22 12:19 PM  Result Value Ref  Range   Fluid Type-FCT CYTO PERI    Color, Fluid YELLOW YELLOW   Appearance, Fluid CLEAR (A) CLEAR   Total Nucleated Cell Count, Fluid 88 0 - 1,000 cu mm   Neutrophil Count, Fluid 23 0 - 25 %   Lymphs, Fluid 46 %   Monocyte-Macrophage-Serous Fluid 31 (L) 50 - 90 %   Eos, Fluid 0 %   Other Cells, Fluid OTHER CELLS IDENTIFIED AS MESOTHELIAL CELLS %    Comment: Performed at Home [address], [place], [place] [address]  Gram stain     Status: None   Collection Time: 02/19/22 12:19 PM   Specimen: PATH Cytology Peritoneal fluid  Result Value Ref Range   Specimen Description FLUID    Special Requests  PERITONEAL    Gram Stain      CYTOSPIN SMEAR WBC PRESENT,BOTH PMN AND MONONUCLEAR NO ORGANISMS SEEN Performed at [facility], [place] [address] [place], [place] [address]    Report Status 02/19/2022 FINAL   Culture, body fluid w Gram Stain-bottle     Status: None (Preliminary result)   Collection Time: 02/19/22 12:19 PM   Specimen: Fluid  Result Value Ref Range   Specimen Description FLUID    Special Requests       PERITONEAL Performed at [facility], [place] [address], [place], [place] [address]    Culture PENDING    Report Status PENDING   CBC with Differential     Status: Abnormal   Collection Time: 02/19/22 12:35 PM  Result Value Ref Range   WBC 4.1 4.0 - 10.5 K/uL   RBC 3.02 (L) 4.22 - 5.81 MIL/uL   Hemoglobin 10.6 (L) 13.0 - 17.0 g/dL   HCT 29.7 (L) 39.0 - 52.0 %   MCV 98.3 80.0 - 100.0 fL   MCH 35.1 (H) 26.0 - 34.0 pg   MCHC 35.7 30.0 - 36.0 g/dL   RDW 13.7 11.5 - 15.5 %   Platelets 45 (L) 150 - 400 K/uL    Comment: Immature Platelet Fraction may be clinically indicated, consider ordering this additional test [id number] REPEATED TO VERIFY PLATELET COUNT CONFIRMED BY SMEAR    nRBC 0.0 0.0 - 0.2 %   Neutrophils Relative % 70 %   Neutro Abs 3.0 1.7 - 7.7 K/uL   Lymphocytes Relative 14 %   Lymphs Abs 0.6 (L) 0.7 - 4.0 K/uL   Monocytes  Relative 12 %   Monocytes Absolute 0.5 0.1 - 1.0 K/uL   Eosinophils Relative 2 %   Eosinophils Absolute 0.1 0.0 - 0.5 K/uL   Basophils Relative 1 %   Basophils Absolute 0.0 0.0 - 0.1 K/uL   Immature Granulocytes 1 %   Abs Immature Granulocytes 0.02 0.00 - 0.07 K/uL    Comment: Performed at [facility], [address], [place], [place] [address]  Comprehensive metabolic panel     Status: Abnormal  Collection Time: 02/19/22 12:35 PM  Result Value Ref Range   Sodium 128 (L) 135 - 145 mmol/L   Potassium 2.8 (L) 3.5 - 5.1 mmol/L   Chloride 102 98 - 111 mmol/L   CO2 18 (L) 22 - 32 mmol/L   Glucose, Bld 115 (H) 70 - 99 mg/dL    Comment: Glucose reference range applies only to samples taken after fasting for at least 8 hours.   BUN <5 (L) 6 - 20 mg/dL   Creatinine, Ser 0.65 0.61 - 1.24 mg/dL   Calcium 7.8 (L) 8.9 - 10.3 mg/dL   Total Protein 5.5 (L) 6.5 - 8.1 g/dL   Albumin 2.1 (L) 3.5 - 5.0 g/dL   AST 69 (H) 15 - 41 U/L   ALT 39 0 - 44 U/L   Alkaline Phosphatase 108 38 - 126 U/L   Total Bilirubin 5.1 (H) 0.3 - 1.2 mg/dL   GFR, Estimated >60 >60 mL/min    Comment: (NOTE) Calculated using the CKD-EPI Creatinine Equation (2021)    Anion gap 8 5 - 15    Comment: Performed at [facility], [place] [address], [place], [place] [address]  Lipase, blood     Status: Abnormal   Collection Time: 02/19/22 12:35 PM  Result Value Ref Range   Lipase 65 (H) 11 - 51 U/L    Comment: Performed at [facility] [address] [place], [place] [address]  Protime-INR     Status: Abnormal   Collection Time: 02/19/22 12:35 PM  Result Value Ref Range   Prothrombin Time 22.9 (H) 11.4 - 15.2 seconds   INR 2.0 (H) 0.8 - 1.2    Comment: (NOTE) INR goal varies based on device and disease states. Performed at [facility], [place] [address], [place], [place] [address]   Lactic acid, plasma     Status: Abnormal   Collection Time: 02/19/22 12:35 PM  Result Value Ref Range   Lactic  Acid, Venous 2.3 (HH) 0.5 - 1.9 mmol/L    Comment: CRITICAL RESULT CALLED TO, READ BACK BY ADAM BROWN, RN AND VERIFIED WITH [facility] AT 1319 ON 2 10 24 $ Performed at [facility] [address] [place], [place] [address]    No results found.  Pending Labs Unresulted Labs (From admission, onward)     Start     Ordered   02/19/22 1132  Lactic acid, plasma  Now then every 2 hours,   R (with STAT occurrences)      02/19/22 1131   02/19/22 1132  Blood culture (routine x 2)  BLOOD CULTURE X 2,   R (with STAT occurrences)      02/19/22 1131            Vitals/Pain Today's Vitals   02/19/22 1156 02/19/22 1204 02/19/22 1527 02/19/22 1528  BP: 134/79 (!) 141/76 123/71   Pulse:   96 98  Resp:   16 18  Temp:    98.5 F (36.9 C)  TempSrc:    Oral  SpO2:   100% 100%  Weight:      Height:      PainSc:        Isolation Precautions No active isolations  Medications Medications  lidocaine (PF) (XYLOCAINE) 1 % injection 5 mL (has no administration in time range)  potassium chloride 10 mEq in 100 mL IVPB (10 mEq Intravenous New Bag/Given 02/19/22 1528)  furosemide (LASIX) injection 60 mg (60 mg Intravenous Given 02/19/22  1523)    Mobility walks     Focused Assessments Cardiac Assessment Handoff:    Lab Results  Component Value Date   CKTOTAL 229 07/19/2021   No results found for: "DDIMER" Does the Patient currently have chest pain? No   , Neuro Assessment Handoff:  Swallow screen pass? Yes          Neuro Assessment:   Neuro Checks:      Has TPA been given? No If patient is a Neuro Trauma and patient is going to OR before floor call report to [place] nurse: [phone] or [phone]   R Recommendations: See Admitting Provider Note  Report given to:   Additional Notes: na

## 2022-02-19 NOTE — Assessment & Plan Note (Addendum)
Patient presents w/ Hgb 10.6, chronically low, w/ MCV 98.3. Moderate Hgb noted on UA. Anemia likely 2/2 to cirrhosis > low INR, and possibly loss in urine.

## 2022-02-19 NOTE — Assessment & Plan Note (Addendum)
Received aggressive repletion on admission given hypokalemia. Potassium this AM **.

## 2022-02-19 NOTE — Progress Notes (Signed)
FMTS Brief Progress Note  S:Patient sleeping comfortably when I rounded on him. Did not wake him    O: BP (!) 116/59 (BP Location: Left Arm)   Pulse (!) 103   Temp 98.6 F (37 C) (Oral)   Resp 17   Ht 5' 1"$  (1.549 m)   Wt 79.8 kg   SpO2 97%   BMI 33.25 kg/m    General: sleeping, NAD  A/P:  Decompensated hepatic cirrhosis  S/p 4.5L diagnostic paracentesis. S/p Lasix IV 60 - GI consulted, appreciate recs - Lasix 19m daily starting tomorrow - Cipro 5059mdaily for SBP prophylaxis  - Spironolactone 100 mg, propranolol 10 mg BID (esophageal variceal ppx), lactulose 30 g q6h -Protonix 40 mg daily   - CIWA for alcohol use   Remainder of plan per H&P   - Orders reviewed. Labs for AM ordered, which was adjusted as needed.    PaShary KeyDO 02/19/2022, 8:34 PM PGY-3, [facility] Night Resident  Please page [phone] questions.

## 2022-02-20 DIAGNOSIS — K7030 Alcoholic cirrhosis of liver without ascites: Secondary | ICD-10-CM

## 2022-02-20 DIAGNOSIS — R601 Generalized edema: Secondary | ICD-10-CM

## 2022-02-20 LAB — CBC
HCT: 27.3 % — ABNORMAL LOW (ref 39.0–52.0)
Hemoglobin: 9.4 g/dL — ABNORMAL LOW (ref 13.0–17.0)
MCH: 34.2 pg — ABNORMAL HIGH (ref 26.0–34.0)
MCHC: 34.4 g/dL (ref 30.0–36.0)
MCV: 99.3 fL (ref 80.0–100.0)
Platelets: 44 10*3/uL — ABNORMAL LOW (ref 150–400)
RBC: 2.75 MIL/uL — ABNORMAL LOW (ref 4.22–5.81)
RDW: 13.9 % (ref 11.5–15.5)
WBC: 4.3 10*3/uL (ref 4.0–10.5)
nRBC: 0 % (ref 0.0–0.2)

## 2022-02-20 LAB — COMPREHENSIVE METABOLIC PANEL
ALT: 32 U/L (ref 0–44)
AST: 57 U/L — ABNORMAL HIGH (ref 15–41)
Albumin: 1.7 g/dL — ABNORMAL LOW (ref 3.5–5.0)
Alkaline Phosphatase: 96 U/L (ref 38–126)
Anion gap: 7 (ref 5–15)
BUN: 7 mg/dL (ref 6–20)
CO2: 16 mmol/L — ABNORMAL LOW (ref 22–32)
Calcium: 7.6 mg/dL — ABNORMAL LOW (ref 8.9–10.3)
Chloride: 109 mmol/L (ref 98–111)
Creatinine, Ser: 0.72 mg/dL (ref 0.61–1.24)
GFR, Estimated: >60 mL/min (ref >60)
Glucose, Bld: 86 mg/dL (ref 70–99)
Potassium: 3 mmol/L — ABNORMAL LOW (ref 3.5–5.1)
Sodium: 132 mmol/L — ABNORMAL LOW (ref 135–145)
Total Bilirubin: 5.2 mg/dL — ABNORMAL HIGH (ref 0.3–1.2)
Total Protein: 4.7 g/dL — ABNORMAL LOW (ref 6.5–8.1)

## 2022-02-20 LAB — PROTIME-INR
INR: 2.2 — ABNORMAL HIGH (ref 0.8–1.2)
Prothrombin Time: 23.9 seconds — ABNORMAL HIGH (ref 11.4–15.2)

## 2022-02-20 MED ORDER — POTASSIUM CHLORIDE CRYS ER 20 MEQ PO TBCR: 40.0000 meq | EXTENDED_RELEASE_TABLET | Freq: Once | ORAL | Status: DC

## 2022-02-20 MED ORDER — THIAMINE HCL 100 MG PO TABS
100.0000 mg | ORAL_TABLET | Freq: Every day | ORAL | 2 refills | Status: DC
  Filled 2022-02-22: qty 100, 100d supply, fill #0

## 2022-02-20 MED ORDER — POTASSIUM CHLORIDE CRYS ER 20 MEQ PO TBCR
40.0000 meq | EXTENDED_RELEASE_TABLET | Freq: Two times a day (BID) | ORAL | Status: DC
  Administered 2022-02-20: 40 meq via ORAL
  Filled 2022-02-20: qty 2

## 2022-02-20 MED ORDER — ADULT MULTIVITAMIN W/MINERALS CH: 1.0000 | ORAL_TABLET | Freq: Every day | ORAL | Status: DC

## 2022-02-20 MED ORDER — FERROUS SULFATE 325 (65 FE) MG PO TABS
325.0000 mg | ORAL_TABLET | Freq: Every day | ORAL | 3 refills | Status: DC
  Filled 2022-02-22: qty 30, 30d supply, fill #0

## 2022-02-20 MED ORDER — FOLIC ACID 1 MG PO TABS
1.0000 mg | ORAL_TABLET | Freq: Every day | ORAL | 11 refills | Status: DC
  Filled 2022-02-22: qty 30, 30d supply, fill #0

## 2022-02-20 MED ORDER — FUROSEMIDE 40 MG PO TABS
40.0000 mg | ORAL_TABLET | Freq: Every day | ORAL | 1 refills | Status: DC
  Filled 2022-02-22: qty 30, 30d supply, fill #0

## 2022-02-20 MED ORDER — LACTULOSE 10 GM/15ML PO SOLN
30.0000 g | Freq: Four times a day (QID) | ORAL | 1 refills | Status: DC
  Filled 2022-02-22: qty 1892, 11d supply, fill #0

## 2022-02-20 MED ORDER — CIPROFLOXACIN HCL 500 MG PO TABS: 500.0000 mg | ORAL_TABLET | Freq: Every day | ORAL | 1 refills | Status: DC

## 2022-02-20 MED ORDER — SPIRONOLACTONE 100 MG PO TABS
100.0000 mg | ORAL_TABLET | Freq: Every day | ORAL | 1 refills | Status: DC
  Filled 2022-02-22: qty 90, 90d supply, fill #0

## 2022-02-20 MED ORDER — PROPRANOLOL HCL 10 MG PO TABS
10.0000 mg | ORAL_TABLET | Freq: Two times a day (BID) | ORAL | 1 refills | Status: DC
  Filled 2022-02-22: qty 60, 30d supply, fill #0

## 2022-02-20 MED ORDER — PANTOPRAZOLE SODIUM 40 MG PO TBEC
40.0000 mg | DELAYED_RELEASE_TABLET | Freq: Every day | ORAL | 1 refills | Status: DC
  Filled 2022-02-22: qty 90, 90d supply, fill #0

## 2022-02-20 NOTE — Discharge Instructions (Addendum)
[place],  Gracias por dejarnos participar en su cuidado. Usted fue hospitalizado por cirrosis heptica descompensada, esto probablemente se debi a que se le [employer]. Mientras estaba en el hospital, le extrajeron 4,5 litros de su abdomen y le retomaron sus medicamentos caseros. Fuiste atendido [employer]. Nos alegra que se sienta mejor.   INSTRUCCIONES DE CUIDADO Y POSHOSPITALARIO 1. Es importante que te abstengas de consumir alcohol 2. Comunquese con su farmacia o mdico antes de quedarse sin medicamentos para renovar. 3. Vaya a sus citas de seguimiento (enumeradas a continuacin)   CITA CON EL MDICO Nombramientos futuros [place] 05/13/2022 10:50 a. Johny Shock, [place] Harry, MD [facility] [place]    Cudate y que ests bien!  [place] de [facility] conmemorativo del cono de [place], [place] [phone]

## 2022-02-20 NOTE — Progress Notes (Signed)
Discharge instructions given. Patient verbalized understanding and all questions were answered.  

## 2022-02-20 NOTE — Discharge Summary (Signed)
[facility] Discharge Summary  Patient name: Mathew [name] [place] Medical record number: [id number] Date of birth: 02-01-1980 Age: 42 y.o. Gender: male Date of Admission: 02/19/2022  Date of Discharge: 02/20/22 Admitting Physician: Lenoria Chime, MD  Primary Care Provider: [name], MD Consultants: GI  Indication for Hospitalization: Decompensated hepatic cirrhosis   Brief Hospital Course:  Mathew [name] is a 42 y.o. male who was admitted to [facility] on 2/11 for acute deep compensated hepatic cirrhosis.  His hospital course is as below by problem, refer to the H&P for additional information  Decompensated hepatic cirrhosis Patient presented with anasarca and jaundice.  Exacerbation due to medication nonadherence.  Labs on admission were significant for sodium 128, potassium 2.8, AST 69, total bilirubin 5.1, platelets 45, INR 2.  He had a paracentesis with 4.5 L removed.  SBP was ruled out given negative Gram stain and cell count 31. Not encephalopathic. He was started on ciprofloxacin for SBP prophylaxis.  He received a dose of IV Lasix.  GI recommended outpatient follow up.  He was started back on his home regimen of Lasix 40 mg, spironolactone 100 mg, propranolol 10 mg, lactulose 30 g every 6 hours, Protonix 40 mg.   EtOH abuse Reported last drink was on 2/8, drink 12 ounces of beer.  CIWA's were ordered, patient did not require any benzodiazepines.  TOC was consulted for substance use resources.  Other conditions chronic and stable.  PCP Issues for follow up: Ensure taking medications appropriately May need to be scheduled for routine paracentesis outpt  F/u with GI outpt  Continue discussing importance of alcohol cessation, assist with medications if amenable  Recheck labs: CBC, CMP, INR   Discharge Diagnoses/Problem List:  Principal Problem:   Cirrhosis ([place]) Active Problems:   Decompensated hepatic cirrhosis ([facility])    Hypokalemia   Hyponatremia   Anemia   Transaminitis   ETOH abuse  Disposition: Home  Discharge Condition: Stable  Discharge Exam:  Blood pressure 104/62, pulse 88, temperature 98.1 F (36.7 C), temperature source Oral, resp. rate 16, height 5' 1"$  (1.549 m), weight 79.8 kg, SpO2 95 %. Physical Exam: General: Chronically ill appearing, scleral icterus, jaundice   Cardiovascular: RRR  Respiratory: CTAB without wheezing/rhonchi/rales Abdomen: Distended, umbilical hernia that is reducible but ttp, +fluid wave Extremities: Non-pitting edema b/l  Significant Procedures: Paracentesis 2/10 with 4.5 L removed   Significant Labs and Imaging:  Recent Labs  Lab 02/19/22 1235 02/20/22 0723  WBC 4.1 4.3  HGB 10.6* 9.4*  HCT 29.7* 27.3*  PLT 45* 44*   Recent Labs  Lab 02/19/22 1235 02/19/22 2137 02/20/22 0723  NA 128* 133* 132*  K 2.8* 3.1* 3.0*  CL 102 106 109  CO2 18* 21* 16*  GLUCOSE 115* 103* 86  BUN <5* <5* 7  CREATININE 0.65 0.75 0.72  CALCIUM 7.8* 7.7* 7.6*  ALKPHOS 108  --  96  AST 69*  --  57*  ALT 39  --  32  ALBUMIN 2.1*  --  1.7*   INR 2.2   Results/Tests Pending at Time of Discharge: Blood culture  Discharge Medications:  Allergies as of 02/20/2022   No Known Allergies      Medication List     STOP taking these medications    nicotine 14 mg/24hr patch Commonly known as: NICODERM CQ - dosed in mg/24 hours       TAKE these medications    acetaminophen 325 MG tablet  Commonly known as: TYLENOL Take 2 tablets (650 mg total) by mouth every 6 (six) hours as needed for mild pain (or Fever >/= 101).   ciprofloxacin 500 MG tablet Commonly known as: CIPRO Take 1 tablet (500 mg total) by mouth daily.   ferrous sulfate 325 (65 FE) MG tablet Tome 1 tableta (325 mg en total) por va oral diariamente con el desayuno. (Take 1 tablet (325 mg total) by mouth daily with breakfast.)   folic acid 1 MG tablet Commonly known as: FOLVITE Tome 1 tableta (1 mg  en total) por va oral diariamente. (Take 1 tablet (1 mg total) by mouth daily.)   furosemide 40 MG tablet Commonly known as: LASIX Tome 1 tableta (40 mg en total) por va oral diariamente. (Take 1 tablet (40 mg total) by mouth daily.)   lactulose 10 GM/15ML solution Commonly known as: CHRONULAC Take 45 mLs (30 g total) by mouth every 6 (six) hours.   multivitamin with minerals Tabs tablet Take 1 tablet by mouth daily.   pantoprazole 40 MG tablet Commonly known as: PROTONIX Take 1 tablet (40 mg total) by mouth daily at 6 (six) AM.   propranolol 10 MG tablet Commonly known as: INDERAL Take 1 tablet (10 mg total) by mouth 2 (two) times daily.   spironolactone 100 MG tablet Commonly known as: ALDACTONE Take 1 tablet (100 mg total) by mouth daily.   thiamine 100 MG tablet Commonly known as: VITAMIN B1 Tome 1 tableta (100 mg en total) por va oral diariamente. (Take 1 tablet (100 mg total) by mouth daily.)        Discharge Instructions: Please refer to Patient Instructions section of EMR for full details.  Patient was counseled important signs and symptoms that should prompt return to medical care, changes in medications, dietary instructions, activity restrictions, and follow up appointments.   Follow-Up Appointments:  Follow-up Information     [name], MD Follow up.   Specialty: Pulmonary Disease Contact information: [address] [place] [address] [phone]         Gastroenterology, [place]. Schedule an appointment as soon as possible for a visit.   Contact information: [place] [address] [phone]                 Gerrit Heck, MD 02/20/2022, 1:29 PM PGY-3, [place]

## 2022-02-21 ENCOUNTER — Other Ambulatory Visit: Payer: Self-pay

## 2022-02-21 ENCOUNTER — Telehealth: Payer: Self-pay

## 2022-02-21 LAB — PATHOLOGIST SMEAR REVIEW

## 2022-02-21 NOTE — Telephone Encounter (Signed)
Transition Care Management Follow-up Telephone Call  Call completed with assistance of Spanish interpreter [id number]/[employer] Date of discharge and from where: 02/20/2022, [facility]  How have you been since you were released from the hospital? He said he is feeling better since he had the fluid removed.  Any questions or concerns? No  Items Reviewed: Did the pt receive and understand the discharge instructions provided?  He said he has them but needs to read them   Medications obtained and verified?  He has no medications and said that he has someone picking him up today to take him to the pharmacy  Other? No  Any new allergies since your discharge? No  Dietary orders reviewed? Yes Do you have support at home? Yes   Home Care and Equipment/Supplies: Were home health services ordered? no If so, what is the name of the agency? N/a  Has the agency set up a time to come to the patient's home? not applicable Were any new equipment or medical supplies ordered?  No What is the name of the medical supply agency? N/a Were you able to get the supplies/equipment? not applicable Do you have any questions related to the use of the equipment or supplies? No  Functional Questionnaire: (I = Independent and D = Dependent) ADLs: independent     Follow up appointments reviewed:  PCP Hospital f/u appt confirmed? Yes  Scheduled to see Dr Joya Gaskins - 03/15/2022. [facility] f/u appt confirmed?  None scheduled at this time Are transportation arrangements needed?  He said he should have a ride to his appointment with Dr Joya Gaskins on 03/15/2022. I instructed him to call the clinic the day before if he does not have a ride and we can schedule a cab ride for him  If their condition worsens, is the pt aware to call PCP or go to the Emergency Dept.? Yes Was the patient provided with contact information for the PCP's office or ED? Yes Was to pt encouraged to call back with questions or  concerns? Yes

## 2022-02-22 ENCOUNTER — Other Ambulatory Visit: Payer: Self-pay

## 2022-02-24 ENCOUNTER — Other Ambulatory Visit: Payer: Self-pay

## 2022-02-24 LAB — CULTURE, BLOOD (ROUTINE X 2)
Culture: NO GROWTH
Culture: NO GROWTH
Special Requests: ADEQUATE
Special Requests: ADEQUATE

## 2022-02-24 LAB — CULTURE, BODY FLUID W GRAM STAIN -BOTTLE: Culture: NO GROWTH

## 2022-02-28 ENCOUNTER — Emergency Department (HOSPITAL_COMMUNITY): Payer: Self-pay

## 2022-02-28 ENCOUNTER — Inpatient Hospital Stay (HOSPITAL_COMMUNITY)
Admission: EM | Admit: 2022-02-28 | Discharge: 2022-03-03 | DRG: 373 | Disposition: A | Discharge status: Home / Self Care | Payer: Self-pay | Attending: Family Medicine | Admitting: Family Medicine

## 2022-02-28 DIAGNOSIS — E876 Hypokalemia: Secondary | ICD-10-CM

## 2022-02-28 DIAGNOSIS — K7290 Hepatic failure, unspecified without coma: Secondary | ICD-10-CM | POA: Diagnosis present

## 2022-02-28 DIAGNOSIS — D696 Thrombocytopenia, unspecified: Secondary | ICD-10-CM | POA: Diagnosis present

## 2022-02-28 DIAGNOSIS — E878 Other disorders of electrolyte and fluid balance, not elsewhere classified: Secondary | ICD-10-CM | POA: Diagnosis present

## 2022-02-28 DIAGNOSIS — K7030 Alcoholic cirrhosis of liver without ascites: Secondary | ICD-10-CM | POA: Diagnosis present

## 2022-02-28 DIAGNOSIS — Z79899 Other long term (current) drug therapy: Secondary | ICD-10-CM

## 2022-02-28 DIAGNOSIS — D649 Anemia, unspecified: Secondary | ICD-10-CM | POA: Diagnosis present

## 2022-02-28 DIAGNOSIS — K652 Spontaneous bacterial peritonitis: Principal | ICD-10-CM | POA: Diagnosis present

## 2022-02-28 DIAGNOSIS — J90 Pleural effusion, not elsewhere classified: Secondary | ICD-10-CM

## 2022-02-28 DIAGNOSIS — K429 Umbilical hernia without obstruction or gangrene: Secondary | ICD-10-CM | POA: Diagnosis present

## 2022-02-28 DIAGNOSIS — F1010 Alcohol abuse, uncomplicated: Secondary | ICD-10-CM | POA: Diagnosis present

## 2022-02-28 DIAGNOSIS — K7031 Alcoholic cirrhosis of liver with ascites: Secondary | ICD-10-CM

## 2022-02-28 LAB — BRAIN NATRIURETIC PEPTIDE: B Natriuretic Peptide: 34.3 pg/mL (ref 0.0–100.0)

## 2022-02-28 LAB — COMPREHENSIVE METABOLIC PANEL
ALT: 26 U/L (ref 0–44)
AST: 57 U/L — ABNORMAL HIGH (ref 15–41)
Albumin: <1.5 g/dL — ABNORMAL LOW (ref 3.5–5.0)
Alkaline Phosphatase: 153 U/L — ABNORMAL HIGH (ref 38–126)
Anion gap: 7 (ref 5–15)
BUN: <5 mg/dL — ABNORMAL LOW (ref 6–20)
CO2: 14 mmol/L — ABNORMAL LOW (ref 22–32)
Calcium: 5.9 mg/dL — CL (ref 8.9–10.3)
Chloride: 110 mmol/L (ref 98–111)
Creatinine, Ser: 0.47 mg/dL — ABNORMAL LOW (ref 0.61–1.24)
GFR, Estimated: >60 mL/min (ref >60)
Glucose, Bld: 80 mg/dL (ref 70–99)
Potassium: 2.7 mmol/L — CL (ref 3.5–5.1)
Sodium: 131 mmol/L — ABNORMAL LOW (ref 135–145)
Total Bilirubin: 3.1 mg/dL — ABNORMAL HIGH (ref 0.3–1.2)
Total Protein: 4.3 g/dL — ABNORMAL LOW (ref 6.5–8.1)

## 2022-02-28 LAB — URINALYSIS, ROUTINE W REFLEX MICROSCOPIC
Bilirubin Urine: NEGATIVE
Glucose, UA: NEGATIVE mg/dL
Ketones, ur: NEGATIVE mg/dL
Nitrite: NEGATIVE
Protein, ur: NEGATIVE mg/dL
Specific Gravity, Urine: <1.005 — ABNORMAL LOW (ref 1.005–1.030)
pH: 6 (ref 5.0–8.0)

## 2022-02-28 LAB — CBC
HCT: 36.3 % — ABNORMAL LOW (ref 39.0–52.0)
Hemoglobin: 12.6 g/dL — ABNORMAL LOW (ref 13.0–17.0)
MCH: 35.1 pg — ABNORMAL HIGH (ref 26.0–34.0)
MCHC: 34.7 g/dL (ref 30.0–36.0)
MCV: 101.1 fL — ABNORMAL HIGH (ref 80.0–100.0)
Platelets: 77 10*3/uL — ABNORMAL LOW (ref 150–400)
RBC: 3.59 MIL/uL — ABNORMAL LOW (ref 4.22–5.81)
RDW: 14.6 % (ref 11.5–15.5)
WBC: 5 10*3/uL (ref 4.0–10.5)
nRBC: 0 % (ref 0.0–0.2)

## 2022-02-28 LAB — I-STAT CHEM 8, ED
BUN: 7 mg/dL (ref 6–20)
Calcium, Ion: 1.02 mmol/L — ABNORMAL LOW (ref 1.15–1.40)
Chloride: 98 mmol/L (ref 98–111)
Creatinine, Ser: 0.8 mg/dL (ref 0.61–1.24)
Glucose, Bld: 169 mg/dL — ABNORMAL HIGH (ref 70–99)
HCT: 38 % — ABNORMAL LOW (ref 39.0–52.0)
Hemoglobin: 12.9 g/dL — ABNORMAL LOW (ref 13.0–17.0)
Potassium: 4.2 mmol/L (ref 3.5–5.1)
Sodium: 129 mmol/L — ABNORMAL LOW (ref 135–145)
TCO2: 19 mmol/L — ABNORMAL LOW (ref 22–32)

## 2022-02-28 LAB — URINALYSIS, MICROSCOPIC (REFLEX)

## 2022-02-28 LAB — AMMONIA: Ammonia: 54 umol/L — ABNORMAL HIGH (ref 9–35)

## 2022-02-28 LAB — LIPASE, BLOOD: Lipase: 57 U/L — ABNORMAL HIGH (ref 11–51)

## 2022-02-28 LAB — PROTIME-INR
INR: 1.5 — ABNORMAL HIGH (ref 0.8–1.2)
Prothrombin Time: 18.3 seconds — ABNORMAL HIGH (ref 11.4–15.2)

## 2022-02-28 LAB — ETHANOL: Alcohol, Ethyl (B): 123 mg/dL — ABNORMAL HIGH (ref <10)

## 2022-02-28 MED ORDER — LACTULOSE 10 GM/15ML PO SOLN
30.0000 g | Freq: Four times a day (QID) | ORAL | Status: DC
  Administered 2022-02-28 – 2022-03-03 (×6): 30 g via ORAL
  Filled 2022-02-28: qty 45
  Filled 2022-02-28 (×2): qty 60
  Filled 2022-02-28: qty 45
  Filled 2022-02-28 (×2): qty 60

## 2022-02-28 MED ORDER — ADULT MULTIVITAMIN W/MINERALS CH
1.0000 | ORAL_TABLET | Freq: Every day | ORAL | Status: DC
  Administered 2022-02-28 – 2022-03-03 (×4): 1 via ORAL
  Filled 2022-02-28 (×4): qty 1

## 2022-02-28 MED ORDER — POTASSIUM CHLORIDE 10 MEQ/100ML IV SOLN
10.0000 meq | INTRAVENOUS | Status: AC
  Administered 2022-02-28 (×4): 10 meq via INTRAVENOUS
  Filled 2022-02-28 (×4): qty 100

## 2022-02-28 MED ORDER — ADULT MULTIVITAMIN W/MINERALS CH: 1.0000 | ORAL_TABLET | Freq: Every day | ORAL | Status: DC

## 2022-02-28 MED ORDER — PROPRANOLOL HCL 10 MG PO TABS
10.0000 mg | ORAL_TABLET | Freq: Two times a day (BID) | ORAL | Status: DC
  Administered 2022-02-28 – 2022-03-03 (×6): 10 mg via ORAL
  Filled 2022-02-28 (×6): qty 1

## 2022-02-28 MED ORDER — THIAMINE HCL 100 MG/ML IJ SOLN
100.0000 mg | Freq: Every day | INTRAMUSCULAR | Status: DC
  Filled 2022-02-28 (×2): qty 2

## 2022-02-28 MED ORDER — FOLIC ACID 1 MG PO TABS
1.0000 mg | ORAL_TABLET | Freq: Every day | ORAL | Status: DC
  Administered 2022-02-28 – 2022-03-03 (×4): 1 mg via ORAL
  Filled 2022-02-28 (×4): qty 1

## 2022-02-28 MED ORDER — FENTANYL CITRATE PF 50 MCG/ML IJ SOSY
12.5000 ug | PREFILLED_SYRINGE | Freq: Once | INTRAMUSCULAR | Status: AC
  Administered 2022-02-28: 12.5 ug via INTRAVENOUS
  Filled 2022-02-28: qty 1

## 2022-02-28 MED ORDER — OXYCODONE HCL 5 MG PO TABS
5.0000 mg | ORAL_TABLET | ORAL | Status: DC | PRN
  Administered 2022-02-28 – 2022-03-01 (×3): 5 mg via ORAL
  Filled 2022-02-28 (×3): qty 1

## 2022-02-28 MED ORDER — FERROUS SULFATE 325 (65 FE) MG PO TABS
325.0000 mg | ORAL_TABLET | Freq: Every day | ORAL | Status: DC
  Administered 2022-03-01 – 2022-03-03 (×3): 325 mg via ORAL
  Filled 2022-02-28 (×3): qty 1

## 2022-02-28 MED ORDER — CALCIUM GLUCONATE-NACL 2-0.675 GM/100ML-% IV SOLN
2.0000 g | Freq: Once | INTRAVENOUS | Status: AC
  Administered 2022-02-28: 2000 mg via INTRAVENOUS
  Filled 2022-02-28: qty 100

## 2022-02-28 MED ORDER — FUROSEMIDE 40 MG PO TABS
40.0000 mg | ORAL_TABLET | Freq: Every day | ORAL | Status: DC
  Administered 2022-02-28 – 2022-03-01 (×2): 40 mg via ORAL
  Filled 2022-02-28 (×2): qty 2

## 2022-02-28 MED ORDER — THIAMINE MONONITRATE 100 MG PO TABS
100.0000 mg | ORAL_TABLET | Freq: Every day | ORAL | Status: DC
  Administered 2022-02-28 – 2022-03-03 (×4): 100 mg via ORAL
  Filled 2022-02-28 (×4): qty 1

## 2022-02-28 MED ORDER — ALBUMIN HUMAN 5 % IV SOLN
25.0000 g | Freq: Once | INTRAVENOUS | Status: AC
  Administered 2022-02-28: 25 g via INTRAVENOUS
  Filled 2022-02-28: qty 500

## 2022-02-28 MED ORDER — SODIUM CHLORIDE 0.9 % IV SOLN
2.0000 g | INTRAVENOUS | Status: DC
  Administered 2022-02-28 – 2022-03-01 (×2): 2 g via INTRAVENOUS
  Filled 2022-02-28 (×2): qty 20

## 2022-02-28 MED ORDER — LORAZEPAM 1 MG PO TABS: 1.0000 mg | ORAL_TABLET | ORAL | Status: DC | PRN

## 2022-02-28 MED ORDER — SPIRONOLACTONE 25 MG PO TABS
100.0000 mg | ORAL_TABLET | Freq: Every day | ORAL | Status: DC
  Administered 2022-02-28 – 2022-03-02 (×3): 100 mg via ORAL
  Filled 2022-02-28: qty 8
  Filled 2022-02-28: qty 4
  Filled 2022-02-28: qty 8

## 2022-02-28 MED ORDER — PANTOPRAZOLE SODIUM 40 MG PO TBEC
40.0000 mg | DELAYED_RELEASE_TABLET | Freq: Every day | ORAL | Status: DC
  Administered 2022-03-01 – 2022-03-03 (×3): 40 mg via ORAL
  Filled 2022-02-28 (×3): qty 1

## 2022-02-28 MED ORDER — POTASSIUM CHLORIDE 20 MEQ PO PACK
40.0000 meq | PACK | Freq: Once | ORAL | Status: AC
  Administered 2022-02-28: 40 meq via ORAL
  Filled 2022-02-28: qty 2

## 2022-02-28 MED ORDER — IOHEXOL 350 MG/ML SOLN
100.0000 mL | Freq: Once | INTRAVENOUS | Status: AC | PRN
  Administered 2022-02-28: 100 mL via INTRAVENOUS

## 2022-02-28 NOTE — Assessment & Plan Note (Addendum)
There is concern for SBP at this time as he is in immense abdominal pain and distension. Awaiting paracentesis by IR. No concern for variceal bleeding at this time.  - IR consulted for paracentesis with albumin, labs (body fluid culture, cytology, albumin, gram stain) - Continue 2g ceftriaxone daily  - Continue lasix and spironolactone  - Continue propranolol  - Continue lactulose  - CBC, CMP, coags daily - Daily weights - Strict ins and outs  - 5 mg oxycodone q 4 hours for pain, consider adding morphine depending on pain level/BP/respiratory status

## 2022-02-28 NOTE — ED Triage Notes (Signed)
Hx of cirrhosis, abd swelling- states had fluid drained off last week---  Jaundiced, pitting edema from toes to hips. Shortness of breath with exertion also.   Drinks etoh daily,

## 2022-02-28 NOTE — Assessment & Plan Note (Deleted)
Patient had calcium of 5.9 (corrected 7.5) which was repleted in ED.  K was 2.7 on admission-repleted with 4 runs K and 40 meq PO. Most likely due to large volume ascites and decreased po intake. S/p repletion in the ED.  - 11pm BMP  - AM CMP

## 2022-02-28 NOTE — Assessment & Plan Note (Signed)
Last drink yesterday. Does not have history of seizures with alcohol withdrawal.  - CIWAs with ativan

## 2022-02-28 NOTE — Assessment & Plan Note (Deleted)
Patient had calcium of 5.9 (corrected 7.9). S/p repletion in ED.  - AM CMP

## 2022-02-28 NOTE — Progress Notes (Signed)
FMTS Interim Progress Note  S: Patient resting comfortably in bed, reports feeling slightly improved.  Has not yet received paracentesis.  Has no further questions at this time.  O: BP (!) 128/92   Pulse 82   Temp 98.1 F (36.7 C)   Resp 13   Ht 5' 1"$  (1.549 m)   SpO2 99%   BMI 33.25 kg/m   General: NAD, chronically ill-appearing Cardio: Regular rate and rhythm, systolic murmur Respiratory: Normal work of breathing on room air, clear to auscultation bilaterally on anterior side-unable to auscultate posteriorly due to positioning and pain. Gastrointestinal: Distended and protuberant abdomen with telangiectasias and reproducible umbilical hernia, tender with slight touch. Derm: Jaundice Extremities 3+ pitting BLE up to abdomen  A/P: SBP decompensated hepatic cirrhosis Stable. Continue current treatment outlined in H&P from 2/19 by Dr. Jinny Sanders, completed first ceftriaxone infusion.  Pending paracentesis, which I suspect will relieve patient's abdominal discomfort.  Leslie Dales, DO 02/28/2022, 8:29 PM PGY-1, [facility] Service pager [phone]

## 2022-02-28 NOTE — Assessment & Plan Note (Signed)
Plts 77 on admission. Due to cirrhosis. Patient did mention increased bruising. No evidence of bleeding at this time.  - Hold DVT ppx  - AM CBC, coags

## 2022-02-28 NOTE — H&P (Cosign Needed Addendum)
Hospital Admission History and Physical Service Pager: [phone]  Patient name: Mathew [name] Medical record number: [id number] Date of Birth: 18-Jun-1980 Age: 42 y.o. Gender: male  Primary Care Provider: [name], MD Consultants: IR Code Status: Full confirmed with patient   Preferred Emergency Contact: [name] (daughter) [phone]; [name] (brother) [phone]  Chief Complaint: abdominal pain and edema   Assessment and Plan: [name] is a 42 y.o. male presenting with abdominal pain and edema. Differential for this patient's presentation of this includes SBP-given severe abdominal pain although no fevers, decompensated cirrhosis-given fluid status on exam, heart failure exacerbation however recent Echo with EF 60-65% and G1DD thus unlikely this is the case, renal failure but patient had normal creatinine.   * SBP (spontaneous bacterial peritonitis) (HCC) s/t Decompensated Hepatic Cirrhosis Patient's MELD score is 20. 19.6% estimated 3 month mortality.  3 points for mSOFA score. 4% 30 day mortality. Patient was recently discharged on 2/11 for decompensated cirrhosis and had paracentesis at that time as well. Has been taking all of his medications (although likely out of lactulose for a bit); likely disease state has progressed. Patient was on ppx cipro at home. There is concern for SBP at this time as he is in immense abdominal pain. No concern for variceal bleeding at this time.  - Admit to FPTS, Attending Dr. Ardelia Mems  - Consult IR for paracentesis with albumin, labs (body fluid culture, cytology, albumin, gram stain) - Start 2g ceftriaxone daily  - Continue lasix and spironolactone  - Continue propranolol  - Continue lactulose  - BMP, next at 2200 - CBC and coags daily, AM CMP - Daily weights - Strict ins and outs  - 5 mg oxycodone q 4 hours for pain, consider adding morphine depending on pain level/BP/respiratory status    Electrolyte abnormality Patient had calcium of 5.9 (corrected 7.5) which was repleted in ED.  K was 2.7 on admission-repleted with 4 runs K and 40 meq PO. Most likely due to large volume ascites and decreased po intake. S/p repletion in the ED.  - 11pm BMP  - AM CMP   Thrombocytopenia (HCC) Plts 77 on admission. Due to cirrhosis. Patient did mention increased bruising. No evidence of bleeding at this time.  - Hold DVT ppx  - AM CBC, coags   ETOH abuse Last drink yesterday. Does not have history of seizures with alcohol withdrawal.  - CIWAs with ativan    FEN/GI: Regular diet with 2L fluid restriction  VTE Prophylaxis: SCDs   Disposition: Admit to progressive   History of Present Illness:  Mathew [name] is a 42 y.o. male presenting with abdominal pain and edema.   Mathew [name] [id number]  Since being discharged from hospital 02/20/2022 he started having more and more abdominal pain and swelling. Says pain started a couple days after discharge. Also endorses some shortness of breath. He also mentions that he has been having more bruising. Denies bleeding. Denies any fevers. Denies any confusion. Denies any medication adherence issues except said he ran out of his lactulose 1.5 weeks ago. Denies any nausea or vomiting. Denies any rectal bleeding.   Did not take any meds today.  Last drink was 2 beers yesterday  In the ED, patient had stable vitals. K of 2.7 and calcium of 5.9 with albumin of < 1.5. Patient was given 40 meq of potassium po and 10 meq potassium IV for 4 runs as well as 2 g of calcium gluconate. He was  also given fentanyl 12.5 mcg for pain. FMTS paged for admission for paracentesis and inpatient workup due to decompensated hepatic cirrhosis.  Review Of Systems: Per HPI with the following additions: abdominal pain, swelling, shortness of breath, bruising   Pertinent Past Medical History: Cirrhosis H/o SBP  Etoh use disorder  Umbilical hernia  Tobacco  use  Remainder reviewed in history tab.   Pertinent Past Surgical History: None  Remainder reviewed in history tab.   Pertinent Social History: Tobacco use: Smoking cigarettes - occasionally - 2 cigarettes yesterday Alcohol use: Daily-last drink 2 beers yesterday-denies any hx of seizures Other Substance use: No Lives with friends  Pertinent Family History: No family hx of cancer or liver disease Remainder reviewed in history tab.   Important Outpatient Medications: Lactulose - ran out of this 1.5 weeks ago Protonix  Lasix 40 daily Ferrisol 325 Vit B1 Spironolactone 100 daily Cipro 500 daily Propanolol 10   Remainder reviewed in medication history.   Objective: BP (!) 131/90   Pulse 85   Temp 98.1 F (36.7 C)   Resp 14   Ht 5' 1"$  (1.549 m)   SpO2 99%   BMI 33.25 kg/m  Exam: General: Ill appearing, no acute distress Eyes: Mild scleral icterus  ENTM: Moist mucous membranes  Cardiovascular: Left sided III/VI systolic murmur, possible S3, cap refill < 2 seconds Respiratory: Normal work of breathing on room air, unable to auscultate due to patient's postioning and pain  Gastrointestinal: Very distended and protuberant abdomen with telangectasias, caput medusae, and reducible umbilical hernia, painful on slight touch, tense MSK: Decreased range of motion secondary to pain  Derm: Jaundiced, no palmar erythema  Neuro: A&O x 4, no asterixis  Ext: 3 + pitting BLE up to the abdomen, warm to touch with mild erythema, shiny   Labs:  CBC BMET  Recent Labs  Lab 02/28/22 1240 02/28/22 1251  WBC 5.0  --   HGB 12.6* 12.9*  HCT 36.3* 38.0*  PLT 77*  --    Recent Labs  Lab 02/28/22 1455  NA 131*  K 2.7*  CL 110  CO2 14*  BUN <5*  CREATININE 0.47*  GLUCOSE 80  CALCIUM 5.9*    Pertinent additional labs INR 1.5, ammonia 54, ethyl 123   EKG: NSR  Imaging Studies Performed:  CXR-Small right pleural effusion.  Low lung volumes. Visualized upper abdomen notable for  slightly prominent loops of small bowel in the right upper quadrant.  CT Head- negative  CT C/A/P-No evidence of significant traumatic injury in the chest, abdomen or  Pelvis, Small left pleural effusion.  Bibasilar atelectasis. Changes of cirrhosis with associated splenomegaly and large volume ascites. Cholelithiasis.  CT Cervical Spine-negative    Lowry Ram, MD 02/28/2022, 6:46 PM PGY-1, [place] [facility] pager: [phone], text pages welcome Secure chat group [facility] Teaching Service   Upper Level Addendum:  I have seen and evaluated this patient along with Dr. Gwendolyn Lima and reviewed the above note, making necessary revisions as appropriate.  I agree with the medical decision making and physical exam as noted above.  Gerrit Heck, MD PGY-2 [facility]

## 2022-02-28 NOTE — ED Provider Notes (Signed)
[facility] Provider Note   CSN: [id number] Arrival date & time: 02/28/22  1121     History  Chief Complaint  Patient presents with   Cirrhosis   ascitis    Mathew [name] is a 42 y.o. male, history of alcohol induced cirrhosis, who presents to the ED secondary to diffuse abdominal pain, swelling of legs that is been going on for the past week.  States he was seen here about a week ago, and received drainage of his belly, and since then has been having worsening swelling of his belly, and swelling of his legs.  Denies any kind of nausea, vomiting, confusion.  Does drink alcohol typically once a week he states, drink 3 beers last night because the pain was so severe in his abdomen.  Denies any chest pain, does endorse some shortness of breath.  Has been taking his medications as prescribed except for he missed his 1 dose of lactulose today, since he was on his way over here.     Home Medications Prior to Admission medications   Medication Sig Start Date End Date Taking? Authorizing Provider  acetaminophen (TYLENOL) 325 MG tablet Take 2 tablets (650 mg total) by mouth every 6 (six) hours as needed for mild pain (or Fever >/= 101). 11/18/21   Jacelyn Grip, MD  ciprofloxacin (CIPRO) 500 MG tablet Take 1 tablet (500 mg total) by mouth daily. 02/20/22   Gerrit Heck, MD  ferrous sulfate 325 (65 FE) MG tablet Take 1 tablet (325 mg total) by mouth daily with breakfast. 02/20/22   Gerrit Heck, MD  folic acid (FOLVITE) 1 MG tablet Take 1 tablet (1 mg total) by mouth daily. 02/20/22   Gerrit Heck, MD  furosemide (LASIX) 40 MG tablet Take 1 tablet (40 mg total) by mouth daily. 02/20/22   Gerrit Heck, MD  lactulose (CHRONULAC) 10 GM/15ML solution Take 45 mLs (30 g total) by mouth every 6 (six) hours. 02/20/22   Gerrit Heck, MD  Multiple Vitamin (MULTIVITAMIN WITH MINERALS) TABS tablet Take 1 tablet by mouth daily. 02/20/22    Gerrit Heck, MD  pantoprazole (PROTONIX) 40 MG tablet Take 1 tablet (40 mg total) by mouth daily at 6 (six) AM. 02/20/22   Gerrit Heck, MD  propranolol (INDERAL) 10 MG tablet Take 1 tablet (10 mg total) by mouth 2 (two) times daily. 02/20/22   Gerrit Heck, MD  spironolactone (ALDACTONE) 100 MG tablet Take 1 tablet (100 mg total) by mouth daily. 02/20/22   Gerrit Heck, MD  thiamine (VITAMIN B1) 100 MG tablet Take 1 tablet (100 mg total) by mouth daily. 02/20/22   Gerrit Heck, MD      Allergies    Patient has no known allergies.    Review of Systems   Review of Systems  Respiratory:  Positive for shortness of breath.   Cardiovascular:  Negative for chest pain.  Gastrointestinal:  Positive for abdominal pain.    Physical Exam Updated Vital Signs BP 130/81   Pulse 90   Temp 98.1 F (36.7 C)   Resp 17   Ht '5\' 1"'$  (1.549 m)   SpO2 100%   BMI 33.25 kg/m  Physical Exam Vitals and nursing note reviewed.  Constitutional:      General: He is not in acute distress.    Appearance: He is well-developed.  HENT:     Head: Normocephalic and atraumatic.  Eyes:     Conjunctiva/sclera: Conjunctivae normal.  Cardiovascular:  Rate and Rhythm: Normal rate and regular rhythm.     Heart sounds: No murmur heard. Pulmonary:     Effort: Pulmonary effort is normal. No respiratory distress.     Breath sounds: Normal breath sounds.  Abdominal:     General: There is distension.     Tenderness: There is abdominal tenderness.  Musculoskeletal:        General: No swelling.     Cervical back: Neck supple.     Right lower leg: 3+ Edema present.     Left lower leg: 3+ Edema present.  Skin:    General: Skin is warm and dry.     Capillary Refill: Capillary refill takes less than 2 seconds.  Neurological:     Mental Status: He is alert.  Psychiatric:        Mood and Affect: Mood normal.     ED Results / Procedures / Treatments   Labs (all labs ordered are listed, but only  abnormal results are displayed) Labs Reviewed  CBC - Abnormal; Notable for the following components:      Result Value   RBC 3.59 (*)    Hemoglobin 12.6 (*)    HCT 36.3 (*)    MCV 101.1 (*)    MCH 35.1 (*)    Platelets 77 (*)    All other components within normal limits  PROTIME-INR - Abnormal; Notable for the following components:   Prothrombin Time 18.3 (*)    INR 1.5 (*)    All other components within normal limits  ETHANOL - Abnormal; Notable for the following components:   Alcohol, Ethyl (B) 123 (*)    All other components within normal limits  COMPREHENSIVE METABOLIC PANEL - Abnormal; Notable for the following components:   Sodium 131 (*)    Potassium 2.7 (*)    CO2 14 (*)    BUN <5 (*)    Creatinine, Ser 0.47 (*)    Calcium 5.9 (*)    Total Protein 4.3 (*)    Albumin <1.5 (*)    AST 57 (*)    Alkaline Phosphatase 153 (*)    Total Bilirubin 3.1 (*)    All other components within normal limits  LIPASE, BLOOD - Abnormal; Notable for the following components:   Lipase 57 (*)    All other components within normal limits  I-STAT CHEM 8, ED - Abnormal; Notable for the following components:   Sodium 129 (*)    Glucose, Bld 169 (*)    Calcium, Ion 1.02 (*)    TCO2 19 (*)    Hemoglobin 12.9 (*)    HCT 38.0 (*)    All other components within normal limits  BRAIN NATRIURETIC PEPTIDE  URINALYSIS, ROUTINE W REFLEX MICROSCOPIC  AMMONIA    EKG None  Radiology CT Head Wo Contrast  Result Date: 02/28/2022 CLINICAL DATA:  Polytrauma, blunt EXAM: CT HEAD WITHOUT CONTRAST TECHNIQUE: Contiguous axial images were obtained from the base of the skull through the vertex without intravenous contrast. RADIATION DOSE REDUCTION: This exam was performed according to the departmental dose-optimization program which includes automated exposure control, adjustment of the mA and/or kV according to patient size and/or use of iterative reconstruction technique. COMPARISON:  None Available.  FINDINGS: Brain: No acute intracranial abnormality. Specifically, no hemorrhage, hydrocephalus, mass lesion, acute infarction, or significant intracranial injury. Vascular: No hyperdense vessel or unexpected calcification. Skull: No acute calvarial abnormality. Sinuses/Orbits: No acute findings Other: None IMPRESSION: Normal study. Electronically Signed   By: Lennette Bihari  Dover M.D.   On: 02/28/2022 16:47   CT Cervical Spine Wo Contrast  Result Date: 02/28/2022 CLINICAL DATA:  Polytrauma, blunt EXAM: CT CERVICAL SPINE WITHOUT CONTRAST TECHNIQUE: Multidetector CT imaging of the cervical spine was performed without intravenous contrast. Multiplanar CT image reconstructions were also generated. RADIATION DOSE REDUCTION: This exam was performed according to the departmental dose-optimization program which includes automated exposure control, adjustment of the mA and/or kV according to patient size and/or use of iterative reconstruction technique. COMPARISON:  None Available. FINDINGS: Alignment: Normal Skull base and vertebrae: No acute fracture. No primary bone lesion or focal pathologic process. Soft tissues and spinal canal: No prevertebral fluid or swelling. No visible canal hematoma. Disc levels: Disc space narrowing and spurring at C5-6. No disc herniation. Upper chest: No acute findings Other: None IMPRESSION: No acute bony abnormality. Electronically Signed   By: Rolm Baptise M.D.   On: 02/28/2022 16:47   CT CHEST ABDOMEN PELVIS W CONTRAST  Result Date: 02/28/2022 CLINICAL DATA:  Polytrauma, blunt. History of cirrhosis, abdominal swelling, ascites. Jaundiced. Shortness of breath. EXAM: CT CHEST, ABDOMEN, AND PELVIS WITH CONTRAST TECHNIQUE: Multidetector CT imaging of the chest, abdomen and pelvis was performed following the standard protocol during bolus administration of intravenous contrast. RADIATION DOSE REDUCTION: This exam was performed according to the departmental dose-optimization program which  includes automated exposure control, adjustment of the mA and/or kV according to patient size and/or use of iterative reconstruction technique. CONTRAST:  172m OMNIPAQUE IOHEXOL 350 MG/ML SOLN COMPARISON:  None Available. FINDINGS: CT CHEST FINDINGS Cardiovascular: Heart is normal size. Aorta is normal caliber. Mediastinum/Nodes: No mediastinal, hilar, or axillary adenopathy. Trachea and esophagus are unremarkable. Thyroid unremarkable. Lungs/Pleura: Jyden Kromer left pleural effusion. Left basilar atelectasis. No confluent opacity or effusion on the right. Linear scarring or atelectasis at the right lung base. Musculoskeletal: Mild bilateral gynecomastia. No acute bony abnormality. CT ABDOMEN PELVIS FINDINGS Hepatobiliary: She severe changes of cirrhosis construct nodular liver. Multiple gallstones within the gallbladder. No biliary ductal dilatation. No focal hepatic abnormality. Pancreas: No focal abnormality or ductal dilatation. Spleen: Splenomegaly with a craniocaudal length of 13.5 cm. No focal abnormality. Adrenals/Urinary Tract: No adrenal abnormality. No focal renal abnormality. No stones or hydronephrosis. Urinary bladder is unremarkable. Stomach/Bowel: Normal appendix. Stomach, large and Lovelee Forner bowel grossly unremarkable. Vascular/Lymphatic: No evidence of aneurysm or adenopathy. Reproductive: No visible focal abnormality. Other: Large volume ascites. Vlasta Baskin umbilical hernia containing ascites. Musculoskeletal: No acute bony abnormality. IMPRESSION: No evidence of significant traumatic injury in the chest, abdomen or pelvis. Tekelia Kareem left pleural effusion.  Bibasilar atelectasis. Changes of cirrhosis with associated splenomegaly and large volume ascites. Cholelithiasis. Electronically Signed   By: KRolm BaptiseM.D.   On: 02/28/2022 16:46   DG Chest 2 View  Result Date: 02/28/2022 CLINICAL DATA:  Shortness of breath EXAM: CHEST - 2 VIEW COMPARISON:  CXR 11/11/21 FINDINGS: Lekendrick Alpern right pleural effusion. No  pneumothorax. Low lung volumes. Unchanged cardiac and mediastinal contours. No focal airspace opacities. Visualized upper abdomen notable for slightly prominent loops of Vonn Sliger bowel in the right upper quadrant. Radiographically apparent displaced rib fractures. Vertebral body heights are maintained. IMPRESSION: 1. Edmonia Gonser right pleural effusion.  Low lung volumes. 2. Visualized upper abdomen notable for slightly prominent loops of Malachy Coleman bowel in the right upper quadrant. If there is clinical concern for abdominal pathology, a dedicated abdominal radiograph is recommended for further evaluation. Electronically Signed   By: HMarin RobertsM.D.   On: 02/28/2022 14:18    Procedures Procedures  Medications Ordered in ED Medications  potassium chloride 10 mEq in 100 mL IVPB (10 mEq Intravenous New Bag/Given 02/28/22 1701)  calcium gluconate 2 g/ 100 mL sodium chloride IVPB (2,000 mg Intravenous New Bag/Given 02/28/22 1720)  potassium chloride (KLOR-CON) packet 40 mEq (40 mEq Oral Given 02/28/22 1703)  iohexol (OMNIPAQUE) 350 MG/ML injection 100 mL (100 mLs Intravenous Contrast Given 02/28/22 1641)  fentaNYL (SUBLIMAZE) injection 12.5 mcg (12.5 mcg Intravenous Given 02/28/22 1707)    ED Course/ Medical Decision Making/ A&P                             Medical Decision Making Patient is a 42 year old male, here for shortness of breath abdominal pain, and weight gain has been increasing since his discharge last week.  He states that he did drink 1 beer last night, history of alcohol abuse.  Will obtain chest x-ray, CBC, CMP, ammonia, for further evaluation.  Amount and/or Complexity of Data Reviewed Labs: ordered.    Details: Hypokalemic at 2.9, calcium of 5.7 Radiology: ordered.    Details: Chest x-ray shows displaced ribs, thus CT head, neck, chest abdomen pelvis ordered unremarkable except for a large volume of ascites Discussion of management or test interpretation with external provider(s): Discussed  with patient, large volume of ascites, symptomatic, and shortness of breath, with pleural effusion, we will admit for IR/paracentesis likely in the a.m.  Additionally will need replenishment of K, and calcium, given 2 g calcium gluconate, and 40 mEq IV of potassium, as well as 40 mEq p.o. of potassium.  Admitted to family medicine Dr. Ardelia Mems  Risk Prescription drug management. Decision regarding hospitalization.   Final Clinical Impression(s) / ED Diagnoses Final diagnoses:  Ascites due to alcoholic cirrhosis ([place])  Pleural effusion  Hypokalemia  Hypocalcemia    Rx / DC Orders ED Discharge Orders     None         Shemaiah Round, Si Gaul, PA 02/28/22 1738    Pattricia Boss, MD 03/07/22 1440

## 2022-02-28 NOTE — Assessment & Plan Note (Deleted)
K was 2.7 on admission. Most likely due to large volume ascites and decreased po intake. S/p repletion in the ED.  - 11pm BMP  - AM CMP

## 2022-02-28 NOTE — ED Provider Triage Note (Signed)
Emergency Medicine Provider Triage Evaluation Note  Artorius Egeland [name]-Cordoba , a 42 y.o. male  was evaluated in triage.  Pt complains of abdominal pain, swelling, SOB x 1 week. Hx of cirrhosis. Drinks ETOH daily. Has umbilical hernia x 5 years, difficulty reducing it + tenderness x 1 week.   Review of Systems  Positive: Abd pain, abd distention, leg swelling, hernia tenderness, SOB Negative: CP, syncope  Physical Exam  BP 123/73   Temp 97.8 F (36.6 C)   Resp (!) 22   Ht 5' 1"$  (1.549 m)   SpO2 100%   BMI 33.25 kg/m  Gen:   Awake, no distress   Resp:  Normal effort  MSK:   Moves extremities without difficulty  Other:  Bilateral 2+ pitting edema,   abd distended, umbilical hernia about 2.5 cm in diameter with tenderness, not easily reducible,   jaundiced, scleral icterus  Medical Decision Making  Medically screening exam initiated at 12:23 PM.  Appropriate orders placed.  Hartman Stillson [name]-Cordoba was informed that the remainder of the evaluation will be completed by another provider, this initial triage assessment does not replace that evaluation, and the importance of remaining in the ED until their evaluation is complete.  Workup initiated including CT imaging and necessary lab work with concern for acute on chronic liver failure   Nareg Breighner T, PA-C 02/28/22 1226

## 2022-03-01 ENCOUNTER — Other Ambulatory Visit: Payer: Self-pay | Admitting: Radiology

## 2022-03-01 ENCOUNTER — Encounter (HOSPITAL_COMMUNITY): Payer: Self-pay | Admitting: Family Medicine

## 2022-03-01 ENCOUNTER — Observation Stay (HOSPITAL_COMMUNITY)
Admit: 2022-03-01 | Discharge: 2022-03-01 | Disposition: A | Discharge status: Home / Self Care | Payer: Self-pay | Attending: Family Medicine | Admitting: Family Medicine

## 2022-03-01 DIAGNOSIS — K7290 Hepatic failure, unspecified without coma: Secondary | ICD-10-CM | POA: Diagnosis present

## 2022-03-01 HISTORY — PX: IR PARACENTESIS: IMG2679

## 2022-03-01 LAB — BODY FLUID CELL COUNT WITH DIFFERENTIAL
Eos, Fluid: 0 %
Lymphs, Fluid: 29 %
Monocyte-Macrophage-Serous Fluid: 64 % (ref 50–90)
Neutrophil Count, Fluid: 7 % (ref 0–25)
Total Nucleated Cell Count, Fluid: 22 cu mm (ref 0–1000)

## 2022-03-01 LAB — COMPREHENSIVE METABOLIC PANEL
ALT: 28 U/L (ref 0–44)
AST: 63 U/L — ABNORMAL HIGH (ref 15–41)
Albumin: 2 g/dL — ABNORMAL LOW (ref 3.5–5.0)
Alkaline Phosphatase: 141 U/L — ABNORMAL HIGH (ref 38–126)
Anion gap: 6 (ref 5–15)
BUN: 6 mg/dL (ref 6–20)
CO2: 18 mmol/L — ABNORMAL LOW (ref 22–32)
Calcium: 7.8 mg/dL — ABNORMAL LOW (ref 8.9–10.3)
Chloride: 105 mmol/L (ref 98–111)
Creatinine, Ser: 0.72 mg/dL (ref 0.61–1.24)
GFR, Estimated: >60 mL/min (ref >60)
Glucose, Bld: 112 mg/dL — ABNORMAL HIGH (ref 70–99)
Potassium: 4.2 mmol/L (ref 3.5–5.1)
Sodium: 129 mmol/L — ABNORMAL LOW (ref 135–145)
Total Bilirubin: 3.1 mg/dL — ABNORMAL HIGH (ref 0.3–1.2)
Total Protein: 5.1 g/dL — ABNORMAL LOW (ref 6.5–8.1)

## 2022-03-01 LAB — CBC
HCT: 28 % — ABNORMAL LOW (ref 39.0–52.0)
Hemoglobin: 9.7 g/dL — ABNORMAL LOW (ref 13.0–17.0)
MCH: 35.9 pg — ABNORMAL HIGH (ref 26.0–34.0)
MCHC: 34.6 g/dL (ref 30.0–36.0)
MCV: 103.7 fL — ABNORMAL HIGH (ref 80.0–100.0)
Platelets: 57 10*3/uL — ABNORMAL LOW (ref 150–400)
RBC: 2.7 MIL/uL — ABNORMAL LOW (ref 4.22–5.81)
RDW: 14.9 % (ref 11.5–15.5)
WBC: 5.1 10*3/uL (ref 4.0–10.5)
nRBC: 0 % (ref 0.0–0.2)

## 2022-03-01 LAB — BASIC METABOLIC PANEL
Anion gap: 7 (ref 5–15)
BUN: <5 mg/dL — ABNORMAL LOW (ref 6–20)
CO2: 18 mmol/L — ABNORMAL LOW (ref 22–32)
Calcium: 7.7 mg/dL — ABNORMAL LOW (ref 8.9–10.3)
Chloride: 104 mmol/L (ref 98–111)
Creatinine, Ser: 0.68 mg/dL (ref 0.61–1.24)
GFR, Estimated: >60 mL/min (ref >60)
Glucose, Bld: 131 mg/dL — ABNORMAL HIGH (ref 70–99)
Potassium: 4.1 mmol/L (ref 3.5–5.1)
Sodium: 129 mmol/L — ABNORMAL LOW (ref 135–145)

## 2022-03-01 LAB — GRAM STAIN

## 2022-03-01 LAB — APTT: aPTT: 36 seconds (ref 24–36)

## 2022-03-01 LAB — ALBUMIN, PLEURAL OR PERITONEAL FLUID: Albumin, Fluid: <1.5 g/dL

## 2022-03-01 LAB — PROTIME-INR
INR: 1.8 — ABNORMAL HIGH (ref 0.8–1.2)
Prothrombin Time: 21 seconds — ABNORMAL HIGH (ref 11.4–15.2)

## 2022-03-01 MED ORDER — OXYCODONE HCL 5 MG PO TABS
5.0000 mg | ORAL_TABLET | Freq: Four times a day (QID) | ORAL | Status: DC | PRN
  Administered 2022-03-02: 5 mg via ORAL
  Filled 2022-03-01: qty 1

## 2022-03-01 MED ORDER — ALBUMIN HUMAN 5 % IV SOLN
25.0000 g | Freq: Once | INTRAVENOUS | Status: AC
  Administered 2022-03-01: 25 g via INTRAVENOUS
  Filled 2022-03-01 (×2): qty 500

## 2022-03-01 MED ORDER — LIDOCAINE HCL 1 % IJ SOLN
INTRAMUSCULAR | Status: AC
  Administered 2022-03-01: 10 mL
  Filled 2022-03-01: qty 20

## 2022-03-01 NOTE — ED Notes (Signed)
..ED TO INPATIENT HANDOFF REPORT  ED Nurse Name and Phone #: [phone]  S Name/Age/Gender Mathew [name] 42 y.o. male Room/Bed: 008C/008C  Code Status   Code Status: Full Code  Home/SNF/Other Home Patient oriented to: self, place, time, and situation Is this baseline? Yes   Triage Complete: Triage complete  Chief Complaint Decompensated hepatic cirrhosis ([place]) [K72.90, [id number]  Triage Note Hx of cirrhosis, abd swelling- states had fluid drained off last week---  Jaundiced, pitting edema from toes to hips. Shortness of breath with exertion also.   Drinks etoh daily,    Allergies No Known Allergies  Level of Care/Admitting Diagnosis ED Disposition     ED Disposition  Admit   Condition  --   Comment  Hospital Area: [place] [[id number]]  Level of Care: Progressive [102]  Admit to Progressive based on following criteria: GI, ENDOCRINE disease patients with GI bleeding, acute liver failure or pancreatitis, stable with diabetic ketoacidosis or thyrotoxicosis (hypothyroid) state.  Admit to Progressive based on following criteria: MULTISYSTEM THREATS such as stable sepsis, metabolic/electrolyte imbalance with or without encephalopathy that is responding to early treatment.  May place patient in observation at [facility] or [place] if equivalent level of care is available:: No  Covid Evaluation: Asymptomatic - no recent exposure (last 10 days) testing not required  Diagnosis: Decompensated hepatic cirrhosis [facility]) [id number]  Admitting Physician: Lowry Ram [id number]  Attending Physician: Leeanne Rio [[id number]]          B Medical/Surgery History Past Medical History:  Diagnosis Date   Acute metabolic encephalopathy [id number]   Alcohol withdrawal seizure ([employer])    Cirrhosis ([place])    ETOH abuse    Pneumonia 04/02/2020   SBP (spontaneous bacterial peritonitis) ([place]) 06/17/2021   Past Surgical History:  Procedure Laterality Date    COLONOSCOPY WITH PROPOFOL N/A 03/17/2018   Procedure: COLONOSCOPY WITH PROPOFOL;  Surgeon: Ronald Lobo, MD;  Location: [place];  Service: Endoscopy;  Laterality: N/A;   ESOPHAGOGASTRODUODENOSCOPY (EGD) WITH PROPOFOL N/A 03/17/2018   Procedure: ESOPHAGOGASTRODUODENOSCOPY (EGD) WITH PROPOFOL;  Surgeon: Ronald Lobo, MD;  Location: [place];  Service: Endoscopy;  Laterality: N/A;   IR PARACENTESIS  03/14/2018   IR PARACENTESIS  11/18/2020   IR PARACENTESIS  06/17/2021   IR PARACENTESIS  08/30/2021   IR PARACENTESIS  09/02/2021   IR PARACENTESIS  11/16/2021     A IV Location/Drains/Wounds Patient Lines/Drains/Airways Status     Active Line/Drains/Airways     Name Placement date Placement time Site Days   Peripheral IV 02/28/22 Anterior;Distal;Left;Upper Arm 02/28/22  1245  Arm  1   Peripheral IV 02/28/22 18 G Anterior;Right Forearm 02/28/22  1712  Forearm  1            Intake/Output Last 24 hours  Intake/Output Summary (Last 24 hours) at 03/01/2022 1136 Last data filed at 02/28/2022 2342 Gross per 24 hour  Intake 100 ml  Output 1500 ml  Net -1400 ml    Labs/Imaging Results for orders placed or performed during the hospital encounter of 02/28/22 (from the past 48 hour(s))  CBC     Status: Abnormal   Collection Time: 02/28/22 12:40 PM  Result Value Ref Range   WBC 5.0 4.0 - 10.5 K/uL   RBC 3.59 (L) 4.22 - 5.81 MIL/uL   Hemoglobin 12.6 (L) 13.0 - 17.0 g/dL   HCT 36.3 (L) 39.0 - 52.0 %   MCV 101.1 (H) 80.0 - 100.0 fL   MCH 35.1 (H) 26.0 -  34.0 pg   MCHC 34.7 30.0 - 36.0 g/dL   RDW 14.6 11.5 - 15.5 %   Platelets 77 (L) 150 - 400 K/uL    Comment: Immature Platelet Fraction may be clinically indicated, consider ordering this additional test [id number] REPEATED TO VERIFY    nRBC 0.0 0.0 - 0.2 %    Comment: Performed at [facility], [place] [address], [place], [place] [address]  Brain natriuretic peptide     Status: None   Collection Time: 02/28/22 12:40 PM   Result Value Ref Range   B Natriuretic Peptide 34.3 0.0 - 100.0 pg/mL    Comment: Performed at [facility] [address] [place], [place] [address]  Protime-INR     Status: Abnormal   Collection Time: 02/28/22 12:40 PM  Result Value Ref Range   Prothrombin Time 18.3 (H) 11.4 - 15.2 seconds   INR 1.5 (H) 0.8 - 1.2    Comment: (NOTE) INR goal varies based on device and disease states. Performed at [facility], [place] [address], [place], [place] [address]   I-stat chem 8, ED (not at [facility], [facility] or [facility])     Status: Abnormal   Collection Time: 02/28/22 12:51 PM  Result Value Ref Range   Sodium 129 (L) 135 - 145 mmol/L   Potassium 4.2 3.5 - 5.1 mmol/L   Chloride 98 98 - 111 mmol/L   BUN 7 6 - 20 mg/dL   Creatinine, Ser 0.80 0.61 - 1.24 mg/dL   Glucose, Bld 169 (H) 70 - 99 mg/dL    Comment: Glucose reference range applies only to samples taken after fasting for at least 8 hours.   Calcium, Ion 1.02 (L) 1.15 - 1.40 mmol/L   TCO2 19 (L) 22 - 32 mmol/L   Hemoglobin 12.9 (L) 13.0 - 17.0 g/dL   HCT 38.0 (L) 39.0 - 52.0 %  Comprehensive metabolic panel     Status: Abnormal   Collection Time: 02/28/22  2:55 PM  Result Value Ref Range   Sodium 131 (L) 135 - 145 mmol/L   Potassium 2.7 (LL) 3.5 - 5.1 mmol/L    Comment: CRITICAL RESULT CALLED TO, READ BACK BY AND VERIFIED WITH E ANELLO.RN [id number] 02/28/2022 WBOND   Chloride 110 98 - 111 mmol/L   CO2 14 (L) 22 - 32 mmol/L   Glucose, Bld 80 70 - 99 mg/dL    Comment: Glucose reference range applies only to samples taken after fasting for at least 8 hours.   BUN <5 (L) 6 - 20 mg/dL   Creatinine, Ser 0.47 (L) 0.61 - 1.24 mg/dL   Calcium 5.9 (LL) 8.9 - 10.3 mg/dL    Comment: CRITICAL RESULT CALLED TO, READ BACK BY AND VERIFIED WITH E ANELLO,RN 1538 02/28/2022 WBOND   Total Protein 4.3 (L) 6.5 - 8.1 g/dL   Albumin <1.5 (L) 3.5 - 5.0 g/dL   AST 57 (H) 15 - 41 U/L   ALT 26 0 - 44 U/L   Alkaline Phosphatase 153 (H) 38 - 126 U/L   Total  Bilirubin 3.1 (H) 0.3 - 1.2 mg/dL   GFR, Estimated >60 >60 mL/min    Comment: (NOTE) Calculated using the CKD-EPI Creatinine Equation (2021)    Anion gap 7 5 - 15    Comment: Performed at [facility], [place] [address], [place],  [address]  Lipase, blood     Status: Abnormal   Collection Time: 02/28/22  2:55 PM  Result Value  Ref Range   Lipase 57 (H) 11 - 51 U/L    Comment: Performed at [facility], [place] [address] [place], [place] [address]  Ethanol     Status: Abnormal   Collection Time: 02/28/22  2:56 PM  Result Value Ref Range   Alcohol, Ethyl (B) 123 (H) <10 mg/dL    Comment: (NOTE) Lowest detectable limit for serum alcohol is 10 mg/dL.  For medical purposes only. Performed at [facility], [place] [address] [place], [place] [address]   Ammonia     Status: Abnormal   Collection Time: 02/28/22  5:22 PM  Result Value Ref Range   Ammonia 54 (H) 9 - 35 umol/L    Comment: Performed at [facility], [place] [address] [place], [place] [address]  Urinalysis, Routine w reflex microscopic -Urine, Clean Catch     Status: Abnormal   Collection Time: 02/28/22  9:08 PM  Result Value Ref Range   Color, Urine YELLOW YELLOW   APPearance CLEAR CLEAR   Specific Gravity, Urine <1.005 (L) 1.005 - 1.030   pH 6.0 5.0 - 8.0   Glucose, UA NEGATIVE NEGATIVE mg/dL   Hgb urine dipstick MODERATE (A) NEGATIVE   Bilirubin Urine NEGATIVE NEGATIVE   Ketones, ur NEGATIVE NEGATIVE mg/dL   Protein, ur NEGATIVE NEGATIVE mg/dL   Nitrite NEGATIVE NEGATIVE   Leukocytes,Ua SMALL (A) NEGATIVE    Comment: Performed at [facility] [address], [place], [place] [address]  Urinalysis, Microscopic (reflex)     Status: Abnormal   Collection Time: 02/28/22  9:08 PM  Result Value Ref Range   RBC / HPF 0-5 0 - 5 RBC/hpf   WBC, UA 0-5 0 - 5 WBC/hpf   Bacteria, UA RARE (A) NONE SEEN   Squamous Epithelial / HPF 0-5 0 - 5 /HPF    Comment: Performed at [facility],  [place] [address], [place], [place] [id number]  Basic metabolic panel     Status: Abnormal   Collection Time: 02/28/22 11:32 PM  Result Value Ref Range   Sodium 129 (L) 135 - 145 mmol/L   Potassium 4.1 3.5 - 5.1 mmol/L   Chloride 104 98 - 111 mmol/L   CO2 18 (L) 22 - 32 mmol/L   Glucose, Bld 131 (H) 70 - 99 mg/dL    Comment: Glucose reference range applies only to samples taken after fasting for at least 8 hours.   BUN <5 (L) 6 - 20 mg/dL   Creatinine, Ser 0.68 0.61 - 1.24 mg/dL   Calcium 7.7 (L) 8.9 - 10.3 mg/dL   GFR, Estimated >60 >60 mL/min    Comment: (NOTE) Calculated using the CKD-EPI Creatinine Equation (2021)    Anion gap 7 5 - 15    Comment: Performed at [facility] [address], [place], [place] [address]  Protime-INR     Status: Abnormal   Collection Time: 03/01/22  1:40 AM  Result Value Ref Range   Prothrombin Time 21.0 (H) 11.4 - 15.2 seconds   INR 1.8 (H) 0.8 - 1.2    Comment: (NOTE) INR goal varies based on device and disease states. Performed at [facility], [place] [address], [place], [place] [address]   APTT     Status: None   Collection Time: 03/01/22  1:40 AM  Result Value Ref Range   aPTT 36 24 - 36 seconds    Comment: Performed at [facility], [address] [place], [place] [address]  Comprehensive metabolic panel     Status: Abnormal   Collection Time: 03/01/22  1:40 AM  Result Value Ref Range   Sodium 129 (L) 135 - 145 mmol/L   Potassium 4.2 3.5 - 5.1 mmol/L   Chloride 105 98 - 111 mmol/L   CO2 18 (L) 22 - 32 mmol/L   Glucose, Bld 112 (H) 70 - 99 mg/dL    Comment: Glucose reference range applies only to samples taken after fasting for at least 8 hours.   BUN 6 6 - 20 mg/dL   Creatinine, Ser 0.72 0.61 - 1.24 mg/dL   Calcium 7.8 (L) 8.9 - 10.3 mg/dL   Total Protein 5.1 (L) 6.5 - 8.1 g/dL   Albumin 2.0 (L) 3.5 - 5.0 g/dL   AST 63 (H) 15 - 41 U/L   ALT 28 0 - 44 U/L   Alkaline Phosphatase 141 (H) 38 - 126 U/L   Total Bilirubin 3.1  (H) 0.3 - 1.2 mg/dL   GFR, Estimated >60 >60 mL/min    Comment: (NOTE) Calculated using the CKD-EPI Creatinine Equation (2021)    Anion gap 6 5 - 15    Comment: Performed at [facility], [place] [address] [place], [place] [address]  CBC     Status: Abnormal   Collection Time: 03/01/22  1:40 AM  Result Value Ref Range   WBC 5.1 4.0 - 10.5 K/uL   RBC 2.70 (L) 4.22 - 5.81 MIL/uL   Hemoglobin 9.7 (L) 13.0 - 17.0 g/dL   HCT 28.0 (L) 39.0 - 52.0 %   MCV 103.7 (H) 80.0 - 100.0 fL   MCH 35.9 (H) 26.0 - 34.0 pg   MCHC 34.6 30.0 - 36.0 g/dL   RDW 14.9 11.5 - 15.5 %   Platelets 57 (L) 150 - 400 K/uL    Comment: Immature Platelet Fraction may be clinically indicated, consider ordering this additional test [id number] REPEATED TO VERIFY    nRBC 0.0 0.0 - 0.2 %    Comment: Performed at [facility], [place] [address] [place], [place] [address]   CT Head Wo Contrast  Result Date: 02/28/2022 CLINICAL DATA:  Polytrauma, blunt EXAM: CT HEAD WITHOUT CONTRAST TECHNIQUE: Contiguous axial images were obtained from the base of the skull through the vertex without intravenous contrast. RADIATION DOSE REDUCTION: This exam was performed according to the departmental dose-optimization program which includes automated exposure control, adjustment of the mA and/or kV according to patient size and/or use of iterative reconstruction technique. COMPARISON:  None Available. FINDINGS: Brain: No acute intracranial abnormality. Specifically, no hemorrhage, hydrocephalus, mass lesion, acute infarction, or significant intracranial injury. Vascular: No hyperdense vessel or unexpected calcification. Skull: No acute calvarial abnormality. Sinuses/Orbits: No acute findings Other: None IMPRESSION: Normal study. Electronically Signed   By: Rolm Baptise M.D.   On: 02/28/2022 16:47   CT Cervical Spine Wo Contrast  Result Date: 02/28/2022 CLINICAL DATA:  Polytrauma, blunt EXAM: CT CERVICAL SPINE WITHOUT CONTRAST TECHNIQUE:  Multidetector CT imaging of the cervical spine was performed without intravenous contrast. Multiplanar CT image reconstructions were also generated. RADIATION DOSE REDUCTION: This exam was performed according to the departmental dose-optimization program which includes automated exposure control, adjustment of the mA and/or kV according to patient size and/or use of iterative reconstruction technique. COMPARISON:  None Available. FINDINGS: Alignment: Normal Skull base and vertebrae: No acute fracture. No primary bone lesion or focal pathologic process. Soft tissues and spinal canal: No prevertebral  fluid or swelling. No visible canal hematoma. Disc levels: Disc space narrowing and spurring at C5-6. No disc herniation. Upper chest: No acute findings Other: None IMPRESSION: No acute bony abnormality. Electronically Signed   By: Rolm Baptise M.D.   On: 02/28/2022 16:47   CT CHEST ABDOMEN PELVIS W CONTRAST  Result Date: 02/28/2022 CLINICAL DATA:  Polytrauma, blunt. History of cirrhosis, abdominal swelling, ascites. Jaundiced. Shortness of breath. EXAM: CT CHEST, ABDOMEN, AND PELVIS WITH CONTRAST TECHNIQUE: Multidetector CT imaging of the chest, abdomen and pelvis was performed following the standard protocol during bolus administration of intravenous contrast. RADIATION DOSE REDUCTION: This exam was performed according to the departmental dose-optimization program which includes automated exposure control, adjustment of the mA and/or kV according to patient size and/or use of iterative reconstruction technique. CONTRAST:  139m OMNIPAQUE IOHEXOL 350 MG/ML SOLN COMPARISON:  None Available. FINDINGS: CT CHEST FINDINGS Cardiovascular: Heart is normal size. Aorta is normal caliber. Mediastinum/Nodes: No mediastinal, hilar, or axillary adenopathy. Trachea and esophagus are unremarkable. Thyroid unremarkable. Lungs/Pleura: Small left pleural effusion. Left basilar atelectasis. No confluent opacity or effusion on the  right. Linear scarring or atelectasis at the right lung base. Musculoskeletal: Mild bilateral gynecomastia. No acute bony abnormality. CT ABDOMEN PELVIS FINDINGS Hepatobiliary: She severe changes of cirrhosis construct nodular liver. Multiple gallstones within the gallbladder. No biliary ductal dilatation. No focal hepatic abnormality. Pancreas: No focal abnormality or ductal dilatation. Spleen: Splenomegaly with a craniocaudal length of 13.5 cm. No focal abnormality. Adrenals/Urinary Tract: No adrenal abnormality. No focal renal abnormality. No stones or hydronephrosis. Urinary bladder is unremarkable. Stomach/Bowel: Normal appendix. Stomach, large and small bowel grossly unremarkable. Vascular/Lymphatic: No evidence of aneurysm or adenopathy. Reproductive: No visible focal abnormality. Other: Large volume ascites. Small umbilical hernia containing ascites. Musculoskeletal: No acute bony abnormality. IMPRESSION: No evidence of significant traumatic injury in the chest, abdomen or pelvis. Small left pleural effusion.  Bibasilar atelectasis. Changes of cirrhosis with associated splenomegaly and large volume ascites. Cholelithiasis. Electronically Signed   By: KRolm BaptiseM.D.   On: 02/28/2022 16:46   DG Chest 2 View  Result Date: 02/28/2022 CLINICAL DATA:  Shortness of breath EXAM: CHEST - 2 VIEW COMPARISON:  CXR 11/11/21 FINDINGS: Small right pleural effusion. No pneumothorax. Low lung volumes. Unchanged cardiac and mediastinal contours. No focal airspace opacities. Visualized upper abdomen notable for slightly prominent loops of small bowel in the right upper quadrant. Radiographically apparent displaced rib fractures. Vertebral body heights are maintained. IMPRESSION: 1. Small right pleural effusion.  Low lung volumes. 2. Visualized upper abdomen notable for slightly prominent loops of small bowel in the right upper quadrant. If there is clinical concern for abdominal pathology, a dedicated abdominal  radiograph is recommended for further evaluation. Electronically Signed   By: HMarin RobertsM.D.   On: 02/28/2022 14:18    Pending Labs Unresulted Labs (From admission, onward)     Start     Ordered   03/02/22 0500  Comprehensive metabolic panel  Daily,   R      03/01/22 0758   03/02/22 0500  CBC with Differential/Platelet  Daily,   R      03/01/22 0758   03/02/22 0500  Protime-INR  Daily,   R      03/01/22 0758   03/01/22 0756  Vitamin B12  Tomorrow morning,   R        03/01/22 0758            Vitals/Pain Today's Vitals   03/01/22 0945 03/01/22  1000 03/01/22 1015 03/01/22 1030  BP: 123/74 127/82  125/81  Pulse: 87 82 83 86  Resp: 18 17 12 14  $ Temp:      TempSrc:      SpO2: 98% 98% 96% 98%  Height:      PainSc:        Isolation Precautions No active isolations  Medications Medications  LORazepam (ATIVAN) tablet 1-4 mg (has no administration in time range)  thiamine (VITAMIN B1) tablet 100 mg (100 mg Oral Given 03/01/22 1002)    Or  thiamine (VITAMIN B1) injection 100 mg ( Intravenous See Alternative [id number])  folic acid (FOLVITE) tablet 1 mg (1 mg Oral Given 03/01/22 0946)  multivitamin with minerals tablet 1 tablet (1 tablet Oral Given 03/01/22 0947)  cefTRIAXone (ROCEPHIN) 2 g in sodium chloride 0.9 % 100 mL IVPB (0 g Intravenous Stopped 02/28/22 1908)  ferrous sulfate tablet 325 mg (325 mg Oral Given 03/01/22 0816)  furosemide (LASIX) tablet 40 mg (40 mg Oral Given 03/01/22 0947)  lactulose (CHRONULAC) 10 GM/15ML solution 30 g (30 g Oral Given 03/01/22 0553)  pantoprazole (PROTONIX) EC tablet 40 mg (40 mg Oral Given 03/01/22 0553)  propranolol (INDERAL) tablet 10 mg (10 mg Oral Given 03/01/22 1001)  spironolactone (ALDACTONE) tablet 100 mg (100 mg Oral Given 03/01/22 0947)  oxyCODONE (Oxy IR/ROXICODONE) immediate release tablet 5 mg (has no administration in time range)  albumin human 5 % solution 25 g (has no administration in time range)  potassium chloride 10  mEq in 100 mL IVPB (0 mEq Intravenous Stopped 02/28/22 2258)  potassium chloride (KLOR-CON) packet 40 mEq (40 mEq Oral Given 02/28/22 1703)  calcium gluconate 2 g/ 100 mL sodium chloride IVPB (0 mg Intravenous Stopped 02/28/22 1823)  iohexol (OMNIPAQUE) 350 MG/ML injection 100 mL (100 mLs Intravenous Contrast Given 02/28/22 1641)  fentaNYL (SUBLIMAZE) injection 12.5 mcg (12.5 mcg Intravenous Given 02/28/22 1707)  albumin human 5 % solution 25 g (0 g Intravenous Stopped 03/01/22 0453)    Mobility walks     Focused Assessments Cardiac Assessment Handoff:  Cardiac Rhythm: Normal sinus rhythm Lab Results  Component Value Date   CKTOTAL 229 07/19/2021   No results found for: "DDIMER" Does the Patient currently have chest pain? No   , Neuro Assessment Handoff:  Swallow screen pass? Yes  Cardiac Rhythm: Normal sinus rhythm       Neuro Assessment: Within Defined Limits Neuro Checks:      Has TPA been given? No If patient is a Neuro Trauma and patient is going to OR before floor call report to [facility] nurse: [phone] or [phone]  , na   R Recommendations: See Admitting Provider Note  Report given to:   Additional Notes: na

## 2022-03-01 NOTE — Procedures (Addendum)
PROCEDURE SUMMARY: Spanish interpretor used.  Successful ultrasound guided paracentesis from the let lower  quadrant.  Yielded 6.5 Liters of straw colored fluid.  No immediate complications.  The patient tolerated the procedure well.   Specimen was sent for labs.  EBL < 38m  If the patient eventually requires >/=2 paracenteses in a 30 day period, screening evaluation by the [facility] will be assessed.

## 2022-03-01 NOTE — Assessment & Plan Note (Addendum)
Hgb 9.7 from 12.9 on admission. No evidence of acute bleeding. Patient's baseline seems to be closer to hgb today. 12.9 seems elevated. Patient was on ferrous sulfate and folic acid outpatient which has been continued here. MCV does indicate macrocytosis.  - Continue to monitor for signs of bleeding  - B12  - AM CBC, coags - Continue to hold dvt ppx

## 2022-03-01 NOTE — Progress Notes (Signed)
FMTS Brief Progress Note  S: Patient endorses that his pain is improved and he is feeling better.  He wants to know when the swelling in his legs will get better and how his liver is doing.  O: BP (!) 119/52 (BP Location: Right Arm)   Pulse 80   Temp 98.7 F (37.1 C) (Oral)   Resp 15   Ht 5' 1"$  (1.549 m)   SpO2 100%   BMI 33.25 kg/m   General: Sleeping comfortably, arousable to voice, no acute distress CV: RRR, systolic murmur appreciated Pulm: CTAB, normal WOB Abdomen: Soft, distended, umbilical hernia appreciated, 1+ pitting edema of lower abdomen, normoactive bowel sounds Extremities: 3+ pitting edema up to hips bilaterally  A/P: Decompensated hepatic cirrhosis with concern of SBP Continue management per day team.  I have discussed with patient that he very much should consider no further alcohol intake, his liver is in bad shape and the fluid in his legs may not completely resolve although we should continue taking medications to get as much fluid off as possible.  Pain appears well-controlled at this time.  Alcohol abuse Most recent CIWA 1, no agitation or signs of withdrawal upon my physical exam.  - Orders reviewed. Labs for AM ordered, which was adjusted as needed.   Wells Guiles, DO 03/01/2022, 10:07 PM PGY-2, [facility] Night Resident  Please page [phone] with questions.

## 2022-03-01 NOTE — Evaluation (Signed)
Physical Therapy Evaluation Patient Details Name: Mathew [name] MRN: [id number] DOB: [birth year] Today's Date: 03/01/2022  History of Present Illness  42 y.o. male presents to [facility] hospital with abdominal pain and edema. Pt admitted for management of suspected spontaneous bacterial peritonitis 2/2 decompensated hepatic cirrhosis. PMH: alcohol cirrhosis with recurrent ascites, ETOH abuse  Clinical Impression  Pt presents to PT with deficits in functional mobility, power, endurance. Pt has significant abdominal and LE swelling. This swelling appears to greatly affect his ability to perform bed mobility at this time, requiring verbal cues for technique and increased time. Once in sitting pt is able to transfer and ambulate well without loss of balance or physical assistance. Pt is encouraged to mobilize frequently in an effort to improve endurance and mobility quality. PT anticipates the pt will continue to progress well, no post-acute services recommended.       Recommendations for follow up therapy are one component of a multi-disciplinary discharge planning process, led by the attending physician.  Recommendations may be updated based on patient status, additional functional criteria and insurance authorization.  Follow Up Recommendations No PT follow up      Assistance Recommended at Discharge PRN  Patient can return home with the following  A little help with bathing/dressing/bathroom;Assistance with cooking/housework;Assist for transportation    Equipment Recommendations None recommended by PT  Recommendations for Other Services       Functional Status Assessment Patient has had a recent decline in their functional status and demonstrates the ability to make significant improvements in function in a reasonable and predictable amount of time.     Precautions / Restrictions Precautions Precautions: Fall Precaution Comments: ascites, LE edema Restrictions Weight Bearing  Restrictions: No      Mobility  Bed Mobility Overal bed mobility: Needs Assistance Bed Mobility: Rolling, Sidelying to Sit Rolling: Min assist   Supine to sit: Min assist     General bed mobility comments: cues for technique, increased time to mobilize LE off stretcher    Transfers Overall transfer level: Needs assistance Equipment used: None Transfers: Sit to/from Stand Sit to Stand: Supervision                Ambulation/Gait Ambulation/Gait assistance: Supervision Gait Distance (Feet): 150 Feet Assistive device: None Gait Pattern/deviations: Step-through pattern Gait velocity: functional Gait velocity interpretation: 1.31 - 2.62 ft/sec, indicative of limited community ambulator   General Gait Details: steady step-through gait, widened BOS  Stairs            Wheelchair Mobility    Modified Rankin (Stroke Patients Only)       Balance Overall balance assessment: Needs assistance Sitting-balance support: No upper extremity supported, Feet supported Sitting balance-Leahy Scale: Good     Standing balance support: No upper extremity supported, During functional activity Standing balance-Leahy Scale: Good                               Pertinent Vitals/Pain Pain Assessment Pain Assessment: No/denies pain    Home Living Family/patient expects to be discharged to:: Private residence Living Arrangements: Non-relatives/Friends Available Help at Discharge: Friend(s) Type of Home: House Home Access: Stairs to enter Entrance Stairs-Rails: Psychiatric nurse of Steps: 2   Home Layout: One level Home Equipment: None Additional Comments: lives with friends -one roommate recently hospitalized.second roommate at work    Prior Function Prior Level of Function : Independent/Modified Independent;Driving;Working/employed  Mobility Comments: works in TEFL teacher, 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 2/2  abdominal pain/swelling ADLs Comments: reports indep with adls and iadls (cooking)     Hand Dominance   Dominant Hand: Right    Extremity/Trunk Assessment   Upper Extremity Assessment Upper Extremity Assessment: Overall WFL for tasks assessed    Lower Extremity Assessment Lower Extremity Assessment: RLE deficits/detail;LLE deficits/detail RLE Deficits / Details: BLE pitting edema, ROM WFL, strength 4+/5 LLE Deficits / Details: BLE pitting edema, ROM WFL, strength 4+/5    Cervical / Trunk Assessment Cervical / Trunk Assessment: Other exceptions (abdominal distention 2/2 ascites)  Communication   Communication: Prefers language other than [place];Interpreter utilized Surveyor, quantity)  Cognition Arousal/Alertness: Awake/alert Behavior During Therapy: WFL for tasks assessed/performed Overall Cognitive Status: Within Functional Limits for tasks assessed                                          General Comments General comments (skin integrity, edema, etc.): VSS on RA, BLE pitting edema    Exercises     Assessment/Plan    PT Assessment Patient needs continued PT services  PT Problem List Decreased activity tolerance;Decreased mobility;Cardiopulmonary status limiting activity;Pain       PT Treatment Interventions Gait training;Functional mobility training;Therapeutic activities;Balance training;Neuromuscular re-education;Patient/family education    PT Goals (Current goals can be found in the Care Plan section)  Acute Rehab PT Goals Patient Stated Goal: to reduce pain and swelling PT Goal Formulation: With patient Time For Goal Achievement: 03/15/22 Potential to Achieve Goals: Fair    Frequency Min 3X/week     Co-evaluation PT/OT/SLP Co-Evaluation/Treatment: Yes Reason for Co-Treatment: To address functional/ADL transfers PT goals addressed during session: Mobility/safety with mobility;Balance         AM-PAC PT "6 Clicks" Mobility  Outcome Measure  Help needed turning from your back to your side while in a flat bed without using bedrails?: A Little Help needed moving from lying on your back to sitting on the side of a flat bed without using bedrails?: A Little Help needed moving to and from a bed to a chair (including a wheelchair)?: A Little Help needed standing up from a chair using your arms (e.g., wheelchair or bedside chair)?: A Little Help needed to walk in hospital room?: A Little Help needed climbing 3-5 steps with a railing? : A Little 6 Click Score: 18    End of Session   Activity Tolerance: Patient tolerated treatment well Patient left: in bed;with call bell/phone within reach Nurse Communication: Mobility status PT Visit Diagnosis: Other abnormalities of gait and mobility (R26.89)    Time: [id number] PT Time Calculation (min) (ACUTE ONLY): 36 min   Charges:   PT Evaluation $PT Eval Low Complexity: [place], PT, DPT Acute Rehabilitation Office [place] 03/01/2022, 12:31 PM

## 2022-03-01 NOTE — ED Notes (Signed)
P transported to IR

## 2022-03-01 NOTE — Evaluation (Signed)
Occupational Therapy Evaluation Patient Details Name: Mathew [name] MRN: [id number] DOB: 09-27-1980 Today's Date: 03/01/2022   History of Present Illness 42 y.o. male presents to [facility] hospital with abdominal pain and edema. Pt admitted for management of suspected spontaneous bacterial peritonitis 2/2 decompensated hepatic cirrhosis. PMH: alcohol cirrhosis with recurrent ascites, ETOH abuse   Clinical Impression   PT admitted with bacterial peritonitis 2/2 hepatic cirrhosis. Pt currently with functional limitiations due to the deficits listed below (see OT problem list). 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 Pt requires mod (A) for LB dressing at this time and could benefit from AE if unable to reach after paracentesis on 2/20. Pt will benefit from skilled OT to increase their independence and safety with adls and balance to allow discharge home.       Recommendations for follow up therapy are one component of a multi-disciplinary discharge planning process, led by the attending physician.  Recommendations may be updated based on patient status, additional functional criteria and insurance authorization.   Follow Up Recommendations  No OT follow up     Assistance Recommended at Discharge None  Patient can return home with the following Assist for transportation    Functional Status Assessment  Patient has had a recent decline in their functional status and demonstrates the ability to make significant improvements in function in a reasonable and predictable amount of time.  Equipment Recommendations  None recommended by OT    Recommendations for Other Services       Precautions / Restrictions Precautions Precautions: Fall      Mobility Bed Mobility Overal bed mobility: Needs Assistance Bed Mobility: Rolling, Supine to Sit Rolling: Min assist   Supine to sit: Min assist     General bed mobility comments: pt needs  additional cues to sequence supine to sit task this session. pt initially just rolling onto R side lying. pt requires increased time to get bil LE off edge of gurney    Transfers Overall transfer level: Needs assistance   Transfers: Sit to/from Stand Sit to Stand: Supervision                  Balance Overall balance assessment: No apparent balance deficits (not formally assessed)                                         ADL either performed or assessed with clinical judgement   ADL Overall ADL's : Needs assistance/impaired Eating/Feeding: Independent   Grooming: Wash/dry hands   Upper Body Bathing: Modified independent   Lower Body Bathing: Min guard   Upper Body Dressing : Modified independent   Lower Body Dressing: Moderate assistance Lower Body Dressing Details (indicate cue type and reason): unable to don socks this session total (A) Toilet Transfer: Supervision/safety   Toileting- Clothing Manipulation and Hygiene: Supervision/safety       Functional mobility during ADLs: Supervision/safety       Vision Baseline Vision/History: 0 No visual deficits Patient Visual Report: No change from baseline Vision Assessment?: No apparent visual deficits     Perception     Praxis      Pertinent Vitals/Pain Pain Assessment Pain Assessment: No/denies pain     Hand Dominance Right   Extremity/Trunk Assessment Upper Extremity Assessment Upper Extremity Assessment: Overall WFL for tasks assessed   Lower Extremity Assessment Lower Extremity Assessment:  Defer to PT evaluation   Cervical / Trunk Assessment Cervical / Trunk Assessment: Other exceptions (pending paracentsis for abdomen distention)   Communication Communication Communication: Prefers language other than [place];Interpreter utilized Surveyor, quantity)   Cognition Arousal/Alertness: Awake/alert Behavior During Therapy: WFL for tasks assessed/performed Overall Cognitive Status: Within  Functional Limits for tasks assessed                                       General Comments  edema noted in bil LE    Exercises     Shoulder Instructions      Home Living Family/patient expects to be discharged to:: Private residence Living Arrangements: Non-relatives/Friends Available Help at Discharge: Friend(s) Type of Home: House Home Access: Stairs to enter Technical brewer of Steps: 2 Entrance Stairs-Rails: Right;Left Home Layout: One level     Bathroom Shower/Tub: Tub only   Biochemist, clinical: Standard     Home Equipment: None   Additional Comments: lives with friends -one roommate recently hospitalized.second roommate at work      Prior Functioning/Environment Prior Level of Function : Independent/Modified Independent;Driving;Working/employed             Mobility Comments: reports borrowed brothers car  to drive to hospital, works as Advertising account planner of house ADLs Comments: reports indep with adls and iadls (cooking)        OT Problem List: Decreased strength;Decreased activity tolerance;Decreased knowledge of use of DME or AE;Decreased knowledge of precautions      OT Treatment/Interventions: Self-care/ADL training;DME and/or AE instruction;Therapeutic activities;Patient/family education;Balance training;Energy conservation;Therapeutic exercise    OT Goals(Current goals can be found in the care plan section) Acute Rehab OT Goals Patient Stated Goal: to be able to return to work OT Goal Formulation: With patient Time For Goal Achievement: 03/15/22 Potential to Achieve Goals: Good  OT Frequency: Min 2X/week    Co-evaluation              AM-PAC OT "6 Clicks" Daily Activity     Outcome Measure Help from another person eating meals?: None Help from another person taking care of personal grooming?: None Help from another person toileting, which includes using toliet, bedpan, or urinal?: None Help from another person bathing  (including washing, rinsing, drying)?: A Little Help from another person to put on and taking off regular upper body clothing?: None Help from another person to put on and taking off regular lower body clothing?: A Lot 6 Click Score: 21   End of Session Nurse Communication: Mobility status;Precautions  Activity Tolerance: Patient tolerated treatment well Patient left: Other (comment) (in hall with PT)  OT Visit Diagnosis: Unsteadiness on feet (R26.81);Muscle weakness (generalized) (M62.81)                Time: 1023-1050 OT Time Calculation (min): 27 min Charges:  OT General Charges $OT Visit: 1 Visit OT Evaluation $OT Eval Moderate Complexity: 1 Mod   Brynn, OTR/L  Acute Rehabilitation Services Office: [phone] .   Jeri Modena 03/01/2022, 10:58 AM

## 2022-03-01 NOTE — ED Notes (Signed)
IR called about pt going up to procedure later in day.

## 2022-03-01 NOTE — Progress Notes (Signed)
Daily Progress Note Intern Pager: [phone]  Patient name: Mathew [name] Medical record number: [id number] Date of birth: 1980/08/27 Age: 42 y.o. Gender: male  Primary Care Provider: [name], MD Consultants: IR  Code Status: Full   Pt Overview and Major Events to Date:  2/19 - Admitted, started ceftriaxone   Assessment and Plan: Mathew [name] is a 42 y.o. male presenting with abdominal pain and edema, most likely has SBP in the setting of decompensated hepatic cirrhosis.   PMH includes h/o SBP, hepatic cirrhosis, Etoh use disorder, tobacco use, umbilical hernia   * SBP (spontaneous bacterial peritonitis) (HCC) s/t Decompensated Hepatic Cirrhosis There is concern for SBP at this time as he is in immense abdominal pain and distension. Awaiting paracentesis by IR. No concern for variceal bleeding at this time.  - IR consulted for paracentesis with albumin, labs (body fluid culture, cytology, albumin, gram stain) - Continue 2g ceftriaxone daily  - Continue lasix and spironolactone  - Continue propranolol  - Continue lactulose  - CBC, CMP, coags daily - Daily weights - Strict ins and outs  - 5 mg oxycodone q 4 hours for pain, consider adding morphine depending on pain level/BP/respiratory status   Anemia Hgb 9.7 from 12.9 on admission. No evidence of acute bleeding. Patient's baseline seems to be closer to hgb today. 12.9 seems elevated. Patient was on ferrous sulfate and folic acid outpatient which has been continued here. MCV does indicate macrocytosis.  - Continue to monitor for signs of bleeding  - B12  - AM CBC, coags - Continue to hold dvt ppx  Electrolyte abnormality Improved. K 4.1, Ca 7.7, did not require repletion this morning.   - AM CMP   Thrombocytopenia (HCC) Plts 57 this AM. No evidence of bleeding at this time.  - Hold DVT ppx  - AM CBC, coags   ETOH abuse CIWA 4 overnight. Has not required ativan.  - CIWAs with ativan     FEN/GI: Regular with 2L fluid restriction  PPx: holding at this time for thrombocytopenia  Dispo: Home pending clinical improvement .   Subjective:  Patient says he is in 10/10 pain and that his abdomen really hurts. He is looking forward to paracentesis for some relief.   Objective: Temp:  [97.8 F (36.6 C)-98.7 F (37.1 C)] 98.6 F (37 C) (02/20 0912) Pulse Rate:  [82-100] 84 (02/20 0900) Resp:  [13-22] 14 (02/20 0900) BP: (109-151)/(63-92) 127/76 (02/20 0900) SpO2:  [95 %-100 %] 97 % (02/20 0900) Physical Exam: General: Very uncomfortable appearing, moaning in pain  ENTM: Moist mucous membranes  Cardiovascular: Left sided III/VI systolic murmur, possible S3, cap refill < 2 seconds Respiratory: Normal work of breathing on room air, unable to auscultate due to patient's postioning and pain  Gastrointestinal: Very distended and protuberant abdomen with telangectasias, caput medusae, and reducible umbilical hernia, painful on slight touch, tense MSK: Decreased range of motion secondary to pain  Derm: Jaundiced, no palmar erythema  Neuro: A&O x 4, no asterixis  Ext: 3 + pitting BLE up to the abdomen, warm to touch with mild erythema, shiny   Laboratory: Most recent CBC Lab Results  Component Value Date   WBC 5.1 03/01/2022   HGB 9.7 (L) 03/01/2022   HCT 28.0 (L) 03/01/2022   MCV 103.7 (H) 03/01/2022   PLT 57 (L) 03/01/2022   Most recent BMP    Latest Ref Rng & Units 03/01/2022    1:40 AM  BMP  Glucose  70 - 99 mg/dL 112   BUN 6 - 20 mg/dL 6   Creatinine 0.61 - 1.24 mg/dL 0.72   Sodium 135 - 145 mmol/L 129   Potassium 3.5 - 5.1 mmol/L 4.2   Chloride 98 - 111 mmol/L 105   CO2 22 - 32 mmol/L 18   Calcium 8.9 - 10.3 mg/dL 7.8    INR 1.8  Lowry Ram, MD 03/01/2022, 9:52 AM  PGY-1, [place] Intern pager: [phone], text pages welcome Secure chat group [place]

## 2022-03-01 NOTE — ED Notes (Signed)
Pt set up with meal tray, and given water per request. Pt stable and no further needs at this time.

## 2022-03-02 DIAGNOSIS — K652 Spontaneous bacterial peritonitis: Secondary | ICD-10-CM

## 2022-03-02 LAB — COMPREHENSIVE METABOLIC PANEL
ALT: 21 U/L (ref 0–44)
AST: 50 U/L — ABNORMAL HIGH (ref 15–41)
Albumin: 2 g/dL — ABNORMAL LOW (ref 3.5–5.0)
Alkaline Phosphatase: 108 U/L (ref 38–126)
Anion gap: 6 (ref 5–15)
BUN: 8 mg/dL (ref 6–20)
CO2: 19 mmol/L — ABNORMAL LOW (ref 22–32)
Calcium: 7.5 mg/dL — ABNORMAL LOW (ref 8.9–10.3)
Chloride: 106 mmol/L (ref 98–111)
Creatinine, Ser: 0.8 mg/dL (ref 0.61–1.24)
GFR, Estimated: >60 mL/min (ref >60)
Glucose, Bld: 89 mg/dL (ref 70–99)
Potassium: 3.5 mmol/L (ref 3.5–5.1)
Sodium: 131 mmol/L — ABNORMAL LOW (ref 135–145)
Total Bilirubin: 3.7 mg/dL — ABNORMAL HIGH (ref 0.3–1.2)
Total Protein: 4.6 g/dL — ABNORMAL LOW (ref 6.5–8.1)

## 2022-03-02 LAB — CBC WITH DIFFERENTIAL/PLATELET
Abs Immature Granulocytes: 0.01 10*3/uL (ref 0.00–0.07)
Basophils Absolute: 0 10*3/uL (ref 0.0–0.1)
Basophils Relative: 1 %
Eosinophils Absolute: 0.1 10*3/uL (ref 0.0–0.5)
Eosinophils Relative: 2 %
HCT: 26 % — ABNORMAL LOW (ref 39.0–52.0)
Hemoglobin: 9.1 g/dL — ABNORMAL LOW (ref 13.0–17.0)
Immature Granulocytes: 0 %
Lymphocytes Relative: 29 %
Lymphs Abs: 0.9 10*3/uL (ref 0.7–4.0)
MCH: 35.5 pg — ABNORMAL HIGH (ref 26.0–34.0)
MCHC: 35 g/dL (ref 30.0–36.0)
MCV: 101.6 fL — ABNORMAL HIGH (ref 80.0–100.0)
Monocytes Absolute: 0.5 10*3/uL (ref 0.1–1.0)
Monocytes Relative: 18 %
Neutro Abs: 1.5 10*3/uL — ABNORMAL LOW (ref 1.7–7.7)
Neutrophils Relative %: 50 %
Platelets: 40 10*3/uL — ABNORMAL LOW (ref 150–400)
RBC: 2.56 MIL/uL — ABNORMAL LOW (ref 4.22–5.81)
RDW: 14.7 % (ref 11.5–15.5)
WBC: 3 10*3/uL — ABNORMAL LOW (ref 4.0–10.5)
nRBC: 0 % (ref 0.0–0.2)

## 2022-03-02 LAB — VITAMIN B12: Vitamin B-12: 918 pg/mL — ABNORMAL HIGH (ref 180–914)

## 2022-03-02 LAB — PROTIME-INR
INR: 2.1 — ABNORMAL HIGH (ref 0.8–1.2)
Prothrombin Time: 23.6 seconds — ABNORMAL HIGH (ref 11.4–15.2)

## 2022-03-02 MED ORDER — FUROSEMIDE 40 MG PO TABS: 40.0000 mg | ORAL_TABLET | Freq: Every day | ORAL | Status: DC

## 2022-03-02 MED ORDER — AMOXICILLIN-POT CLAVULANATE 875-125 MG PO TABS
1.0000 | ORAL_TABLET | Freq: Two times a day (BID) | ORAL | Status: DC
  Administered 2022-03-02 – 2022-03-03 (×3): 1 via ORAL
  Filled 2022-03-02 (×3): qty 1

## 2022-03-02 MED ORDER — SPIRONOLACTONE 100 MG PO TABS
200.0000 mg | ORAL_TABLET | Freq: Every day | ORAL | Status: DC
  Administered 2022-03-03: 200 mg via ORAL
  Filled 2022-03-02: qty 2

## 2022-03-02 MED ORDER — FUROSEMIDE 40 MG PO TABS
80.0000 mg | ORAL_TABLET | Freq: Every day | ORAL | Status: DC
  Administered 2022-03-02 – 2022-03-03 (×2): 80 mg via ORAL
  Filled 2022-03-02 (×2): qty 2

## 2022-03-02 NOTE — Progress Notes (Signed)
Daily Progress Note Intern Pager: [phone]  Patient name: Mathew [name] Medical record number: [id number] Date of birth: [birth year] Age: 42 y.o. Gender: male  Primary Care Provider: [name], MD Consultants: IR  Code Status: Full   Pt Overview and Major Events to Date:  2/19 - Admitted, started ceftriaxone    Assessment and Plan: Mathew [name] is a 42 y.o. male presenting with abdominal pain and edema, most likely has SBP in the setting of decompensated hepatic cirrhosis. Now improved.    PMH includes h/o SBP, hepatic cirrhosis, Etoh use disorder, tobacco use, umbilical hernia   * SBP (spontaneous bacterial peritonitis) (HCC) s/t Decompensated Hepatic Cirrhosis S/p 6.5 L drained with paracentesis. Labs from paracentesis unconcerning for SBP; however, given that paracentesis was after antibiotics were initiated and patient's initial presentation of abdominal pain and distension, will continue to treat for full course of SBP antibiotic course.  - IR consulted, messaged regarding setting patient up with paracentesis clinic outpatient  - Switch antibiotics to augmentin 875-125 po for SBP  - Increase lasix to 80 mg po and spironolactone to 200 mg po  - Continue propranolol  - Continue lactulose  - CBC, CMP, coags daily - Daily weights - Strict ins and outs  - Discontinue oxycodone as patient has not needed it.  - Discuss outpatient palliative care with patient   Anemia Hgb 9.7 from 12.9 on admission. No evidence of acute bleeding. Patient's baseline seems to be closer to hgb today. 12.9 seems elevated. Patient was on ferrous sulfate and folic acid outpatient which has been continued here. MCV does indicate macrocytosis.  - Continue to monitor for signs of bleeding  - B12  - AM CBC, coags - Continue to hold dvt ppx  Thrombocytopenia (HCC) Plts 40 this AM. No evidence of bleeding at this time.  - Hold pharmacologic DVT ppx, SCDs  - AM CBC,  coags   ETOH abuse CIWA 1 overnight. Has not required ativan.  - CIWAs  - discontinued ativan    FEN/GI: Regular with fluid restriction of 2L  PPx: SCDs  Dispo:Home with home health .   Subjective:  Patient says his pain is much better today. Says it is not completely gone but better. He was also able to eat.   Objective: Temp:  [98.7 F (37.1 C)-99.8 F (37.7 C)] 99.3 F (37.4 C) (02/21 0422) Pulse Rate:  [80-92] 91 (02/21 0422) Resp:  [12-16] 12 (02/21 0422) BP: (112-119)/(52-62) 112/62 (02/21 0422) SpO2:  [100 %] 100 % (02/21 0422) Weight:  [89.1 kg] 89.1 kg (02/21 0424) Physical Exam: General: Chronically ill appearing, but improved, sitting up in bed  Cardiovascular: RRR, III/VI systolic flow murmur in left chest, radial pulses equal and palpable, cap refill < 2 seconds Respiratory: Normal work of breathing on room air, CTAB Abdomen: protuberant and distended, but less than prior, fluid wave present, telangiectasias and caput medusae with reducible umbilical hernia  Extremities: 2+ pitting edema BLE   Laboratory: Most recent CBC Lab Results  Component Value Date   WBC 3.0 (L) 03/02/2022   HGB 9.1 (L) 03/02/2022   HCT 26.0 (L) 03/02/2022   MCV 101.6 (H) 03/02/2022   PLT 40 (L) 03/02/2022   Most recent BMP    Latest Ref Rng & Units 03/02/2022    4:52 AM  BMP  Glucose 70 - 99 mg/dL 89   BUN 6 - 20 mg/dL 8   Creatinine 0.61 - 1.24 mg/dL 0.80   Sodium  135 - 145 mmol/L 131   Potassium 3.5 - 5.1 mmol/L 3.5   Chloride 98 - 111 mmol/L 106   CO2 22 - 32 mmol/L 19   Calcium 8.9 - 10.3 mg/dL 7.5     Lowry Ram, MD 03/02/2022, 1:22 PM  PGY-1, [place] Intern pager: [phone], text pages welcome Secure chat group [place]

## 2022-03-02 NOTE — Progress Notes (Signed)
Physical Therapy Treatment Patient Details Name: Mathew [name] MRN: [id number] DOB: 02/04/1980 Today's Date: 03/02/2022   History of Present Illness 42 y.o. male presents to [facility] hospital with abdominal pain and edema. Pt admitted for management of suspected spontaneous bacterial peritonitis 2/2 decompensated hepatic cirrhosis. PMH: alcohol cirrhosis with recurrent ascites, ETOH abuse    PT Comments    Pt greeted supine and agreeable to session with good progress towards acute goals. Pt demonstrating increased tolerance for gait this date at supervision level without AD with no over LOB noted however pt with some slight drifting R/L initially improving with increased distance. Pt with improved bed mobility this date needing light min A to elevate trunk at pt request. Continued to encourage pt to mobilize frequently. Pt continues to benefit from skilled PT services to progress toward functional mobility goals.     Recommendations for follow up therapy are one component of a multi-disciplinary discharge planning process, led by the attending physician.  Recommendations may be updated based on patient status, additional functional criteria and insurance authorization.  Follow Up Recommendations  No PT follow up     Assistance Recommended at Discharge PRN  Patient can return home with the following A little help with bathing/dressing/bathroom;Assistance with cooking/housework;Assist for transportation   Equipment Recommendations  None recommended by PT    Recommendations for Other Services       Precautions / Restrictions Precautions Precautions: Fall Precaution Comments: ascites, LE edema Restrictions Weight Bearing Restrictions: No     Mobility  Bed Mobility Overal bed mobility: Needs Assistance Bed Mobility: Supine to Sit, Sit to Supine     Supine to sit: Min assist Sit to supine: Min guard   General bed mobility comments: light min asssit to elevate trunk at pt  request    Transfers Overall transfer level: Needs assistance Equipment used: None Transfers: Sit to/from Stand Sit to Stand: Supervision           General transfer comment: stable power up    Ambulation/Gait Ambulation/Gait assistance: Supervision Gait Distance (Feet): 500 Feet Assistive device: None Gait Pattern/deviations: Step-through pattern Gait velocity: functional     General Gait Details: steady step-through gait, widened BOS   Stairs             Wheelchair Mobility    Modified Rankin (Stroke Patients Only)       Balance Overall balance assessment: Needs assistance Sitting-balance support: No upper extremity supported, Feet supported Sitting balance-Leahy Scale: Good     Standing balance support: No upper extremity supported, During functional activity Standing balance-Leahy Scale: Good   Single Leg Stance - Right Leg: 10 Single Leg Stance - Left Leg: 15                        Cognition Arousal/Alertness: Awake/alert Behavior During Therapy: WFL for tasks assessed/performed Overall Cognitive Status: Within Functional Limits for tasks assessed                                          Exercises Other Exercises Other Exercises: SLS x3 ea side    General Comments General comments (skin integrity, edema, etc.): VSS on RA, BLE pitting edema      Pertinent Vitals/Pain Pain Assessment Pain Assessment: No/denies pain    Home Living  Prior Function            PT Goals (current goals can now be found in the care plan section) Acute Rehab PT Goals PT Goal Formulation: With patient Time For Goal Achievement: 03/15/22 Progress towards PT goals: Progressing toward goals    Frequency    Min 3X/week      PT Plan      Co-evaluation              AM-PAC PT "6 Clicks" Mobility   Outcome Measure  Help needed turning from your back to your side while in a flat bed  without using bedrails?: A Little Help needed moving from lying on your back to sitting on the side of a flat bed without using bedrails?: A Little Help needed moving to and from a bed to a chair (including a wheelchair)?: A Little Help needed standing up from a chair using your arms (e.g., wheelchair or bedside chair)?: A Little Help needed to walk in hospital room?: A Little Help needed climbing 3-5 steps with a railing? : A Little 6 Click Score: 18    End of Session   Activity Tolerance: Patient tolerated treatment well Patient left: in bed;with call bell/phone within reach Nurse Communication: Mobility status PT Visit Diagnosis: Other abnormalities of gait and mobility (R26.89)     Time: [id number] PT Time Calculation (min) (ACUTE ONLY): 15 min  Charges:  $Therapeutic Activity: 8-22 mins                     Mathew [name] R. PTA Acute Rehabilitation Services Office: [place] 03/02/2022, 2:52 PM

## 2022-03-03 ENCOUNTER — Other Ambulatory Visit (HOSPITAL_COMMUNITY): Payer: Self-pay

## 2022-03-03 LAB — COMPREHENSIVE METABOLIC PANEL
ALT: 20 U/L (ref 0–44)
AST: 44 U/L — ABNORMAL HIGH (ref 15–41)
Albumin: 1.9 g/dL — ABNORMAL LOW (ref 3.5–5.0)
Alkaline Phosphatase: 126 U/L (ref 38–126)
Anion gap: 7 (ref 5–15)
BUN: 11 mg/dL (ref 6–20)
CO2: 21 mmol/L — ABNORMAL LOW (ref 22–32)
Calcium: 7.7 mg/dL — ABNORMAL LOW (ref 8.9–10.3)
Chloride: 103 mmol/L (ref 98–111)
Creatinine, Ser: 0.98 mg/dL (ref 0.61–1.24)
GFR, Estimated: >60 mL/min (ref >60)
Glucose, Bld: 124 mg/dL — ABNORMAL HIGH (ref 70–99)
Potassium: 3.5 mmol/L (ref 3.5–5.1)
Sodium: 131 mmol/L — ABNORMAL LOW (ref 135–145)
Total Bilirubin: 3.3 mg/dL — ABNORMAL HIGH (ref 0.3–1.2)
Total Protein: 4.6 g/dL — ABNORMAL LOW (ref 6.5–8.1)

## 2022-03-03 LAB — CBC WITH DIFFERENTIAL/PLATELET
Abs Immature Granulocytes: 0.01 10*3/uL (ref 0.00–0.07)
Basophils Absolute: 0 10*3/uL (ref 0.0–0.1)
Basophils Relative: 1 %
Eosinophils Absolute: 0.1 10*3/uL (ref 0.0–0.5)
Eosinophils Relative: 3 %
HCT: 25.6 % — ABNORMAL LOW (ref 39.0–52.0)
Hemoglobin: 8.8 g/dL — ABNORMAL LOW (ref 13.0–17.0)
Immature Granulocytes: 0 %
Lymphocytes Relative: 31 %
Lymphs Abs: 0.9 10*3/uL (ref 0.7–4.0)
MCH: 35.1 pg — ABNORMAL HIGH (ref 26.0–34.0)
MCHC: 34.4 g/dL (ref 30.0–36.0)
MCV: 102 fL — ABNORMAL HIGH (ref 80.0–100.0)
Monocytes Absolute: 0.4 10*3/uL (ref 0.1–1.0)
Monocytes Relative: 15 %
Neutro Abs: 1.5 10*3/uL — ABNORMAL LOW (ref 1.7–7.7)
Neutrophils Relative %: 50 %
Platelets: 45 10*3/uL — ABNORMAL LOW (ref 150–400)
RBC: 2.51 MIL/uL — ABNORMAL LOW (ref 4.22–5.81)
RDW: 14.9 % (ref 11.5–15.5)
WBC: 2.9 10*3/uL — ABNORMAL LOW (ref 4.0–10.5)
nRBC: 0 % (ref 0.0–0.2)

## 2022-03-03 LAB — PROTIME-INR
INR: 2 — ABNORMAL HIGH (ref 0.8–1.2)
Prothrombin Time: 22.8 seconds — ABNORMAL HIGH (ref 11.4–15.2)

## 2022-03-03 MED ORDER — FUROSEMIDE 80 MG PO TABS
80.0000 mg | ORAL_TABLET | Freq: Every day | ORAL | 0 refills | Status: DC
  Filled 2022-03-03: qty 30, 30d supply, fill #0

## 2022-03-03 MED ORDER — AMOXICILLIN-POT CLAVULANATE 875-125 MG PO TABS
1.0000 | ORAL_TABLET | Freq: Two times a day (BID) | ORAL | 0 refills | Status: AC
  Filled 2022-03-03: qty 3, 2d supply, fill #0

## 2022-03-03 MED ORDER — SULFAMETHOXAZOLE-TRIMETHOPRIM 800-160 MG PO TABS
1.0000 | ORAL_TABLET | Freq: Every day | ORAL | 0 refills | Status: DC
  Filled 2022-03-03: qty 30, 30d supply, fill #0

## 2022-03-03 MED ORDER — SPIRONOLACTONE 100 MG PO TABS
200.0000 mg | ORAL_TABLET | Freq: Every day | ORAL | 0 refills | Status: DC
  Filled 2022-03-03: qty 60, 30d supply, fill #0

## 2022-03-03 NOTE — Hospital Course (Signed)
Mathew [name] is a 42 y.o.male with a history of decompensated hepatic cirrhosis,  h/o SBP, Etoh use, tobacco use who was admitted to the [facility] Medicine Teaching Service at [facility] for abdominal pain and swelling. His hospital course is detailed below:  Spontaneous Bacterial Peritonitis I Decompensated Hepatic Cirrhosis  Patient was admitted with severely tender abdomen that was very distended. He had been recently admitted a week prior and had paracentesis done during that admission. He was on SBP ppx with cipro before this admission. Patient was afebrile and hemodynamically stable on admission. He received IV 60 mg lasix x1. IR was consulted for paracentesis. Home lasix, spironolactone, propranolol, lactulose, thiamine, folic acid, and ferrous sulfate were all continued. Patient was started on 2 g ceftriaxone daily. Paracentesis was performed on 2/19 with 6.5 L output, labs were non concerning for SBP; however, this was after patient had already been on antibiotics, thus SBP treatment was continued as patient clinically appeared to have SBP. He was transitioned to po augmentin on 2/21. Lasix and spironolactone were also increased to 80 mg and 200 mg respectively. Patient remained stable thereafter and was referred to the Radiology paracentesis clinic outpatient.      Electrolyte Abnormalities  K on admission was 2.7 and Ca was 5.9 most likely due to cirrhosis and fluid and electrolyte shifts. After repletion on initial day, patient's electrolytes remained stable.   Thrombocytopenia  On admission patient's platelets were 77 and dropped to a minimum of 40 during admission. Pharmacologic dvt prophylaxis was held. CBC and coags were monitored daily. He did not have any evidence of bleeding during admission and propanolol was continued as varicocele bleed prevention.   Etoh use  Patient was monitored using CIWAs throughout the admission. He did not require any ativan.   PCP Follow-up  Recommendations: Follow up admission into the paracentesis outpatient clinic  BMP to monitor electrolytes after transitioning to higher lasix and spironolactone dose  Continue palliative care discussion and alcohol cessation

## 2022-03-03 NOTE — Discharge Summary (Signed)
[facility] Discharge Summary  Patient name: Mathew [name] Medical record number: [id number] Date of birth: 1980-06-10 Age: 42 y.o. Gender: male Date of Admission: 02/28/2022  Date of Discharge: 03/03/22  Admitting Physician: Provider Default, MD  Primary Care Provider: [name], MD Consultants: IR  Indication for Hospitalization: SBP  Discharge Diagnoses/Problem List:  Principal Problem for Admission: SBP, hepatic cirrhosis  Other Problems addressed during stay:  Principal Problem:   SBP (spontaneous bacterial peritonitis) ([place]) s/t Decompensated Hepatic Cirrhosis Active Problems:   ETOH abuse   Thrombocytopenia ([place])   Decompensated hepatic cirrhosis [facility])    Brief Hospital Course:  Mathew [name] is a 42 y.o.male with a history of decompensated hepatic cirrhosis,  h/o SBP, Etoh use, tobacco use who was admitted to the [facility] Medicine Teaching Service at [facility] for abdominal pain and swelling. His hospital course is detailed below:  Spontaneous Bacterial Peritonitis I Decompensated Hepatic Cirrhosis  Patient was admitted with severely tender abdomen that was very distended. He had been recently admitted a week prior and had paracentesis done during that admission. He was on SBP ppx with cipro before this admission. Patient was afebrile and hemodynamically stable on admission. He received IV 60 mg lasix x1. IR was consulted for paracentesis. Home lasix, spironolactone, propranolol, lactulose, thiamine, folic acid, and ferrous sulfate were all continued. Patient was started on 2 g ceftriaxone daily. Paracentesis was performed on 2/19 with 6.5 L output, labs were non concerning for SBP; however, this was after patient had already been on antibiotics, thus SBP treatment was continued as patient clinically appeared to have SBP. He was transitioned to po augmentin on 2/21. Lasix and spironolactone were also increased to 80 mg and  200 mg respectively. Patient remained stable thereafter and was referred to the Radiology paracentesis clinic outpatient.      Electrolyte Abnormalities  K on admission was 2.7 and Ca was 5.9 most likely due to cirrhosis and fluid and electrolyte shifts. After repletion on initial day, patient's electrolytes remained stable.   Thrombocytopenia  On admission patient's platelets were 77 and dropped to a minimum of 40 during admission. Pharmacologic dvt prophylaxis was held. CBC and coags were monitored daily. He did not have any evidence of bleeding during admission and propanolol was continued as varicocele bleed prevention.   Etoh use  Patient was monitored using CIWAs throughout the admission. He did not require any ativan.   PCP Follow-up Recommendations: Follow up admission into the paracentesis outpatient clinic  BMP to monitor electrolytes after transitioning to higher lasix and spironolactone dose  Continue palliative care discussion and alcohol cessation    Disposition: Home   Discharge Condition: Stable, improved  Discharge Exam:  Vitals:   03/03/22 0258 03/03/22 0400  BP: 107/60 115/65  Pulse: 83 81  Resp: 15 11  Temp: 98.9 F (37.2 C)   SpO2: 96% 92%   General: Chronically ill, but in no acute distress, much brighter today  CV: RRR, III/VI systolic murmur, cap refill < 2 seconds  Resp: No increased work of breathing, diminished at bases  Abd: Soft, fluid wave, less protuberant still distended but not taut  Ext: 2+ pitting edema BLE   Significant Procedures:   IR Paracentesis Successful ultrasound-guided therapeutic and diagnostic paracentesis yielding 6.5 liters of peritoneal fluid.  If the patient eventually requires >/=2 paracenteses in a 30 day period, candidacy for formal evaluation by the [facility] will be assessed.  Significant Labs and  Imaging:  Recent Labs  Lab 03/02/22 0452 03/03/22 0452  WBC  3.0* 2.9*  HGB 9.1* 8.8*  HCT 26.0* 25.6*  PLT 40* 45*   Recent Labs  Lab 03/02/22 0452 03/03/22 0452  NA 131* 131*  K 3.5 3.5  CL 106 103  CO2 19* 21*  GLUCOSE 89 124*  BUN 8 11  CREATININE 0.80 0.98  CALCIUM 7.5* 7.7*  ALKPHOS 108 126  AST 50* 44*  ALT 21 20  ALBUMIN 2.0* 1.9*    CXR Chest 1. Small right pleural effusion.  Low lung volumes. 2. Visualized upper abdomen notable for slightly prominent loops of small bowel in the right upper quadrant. If there is clinical concern for abdominal pathology, a dedicated abdominal radiograph is recommended for further evaluation.  CT Head WO Contrast Normal study.   CT Cervical Spine WO Contrast No acute bony abnormality.   CT Chest Abdomen Pelvis W Contrast No evidence of significant traumatic injury in the chest, abdomen or pelvis.   Small left pleural effusion.  Bibasilar atelectasis.   Changes of cirrhosis with associated splenomegaly and large volume ascites.   Cholelithiasis.  Results/Tests Pending at Time of Discharge: None  Discharge Medications:  Allergies as of 03/03/2022   No Known Allergies      Medication List     STOP taking these medications    ciprofloxacin 500 MG tablet Commonly known as: CIPRO       TAKE these medications    acetaminophen 325 MG tablet Commonly known as: TYLENOL Take 2 tablets (650 mg total) by mouth every 6 (six) hours as needed for mild pain (or Fever >/= 101).   amoxicillin-clavulanate 875-125 MG tablet Commonly known as: AUGMENTIN Take 1 tablet by mouth every 12 (twelve) hours for 2 days.   FeroSul 325 (65 FE) MG tablet Generic drug: ferrous sulfate Tome 1 tableta (325 mg en total) por va oral diariamente con el desayuno. (Take 1 tablet (325 mg total) by mouth daily with breakfast.)   folic acid 1 MG tablet Commonly known as: FOLVITE Tome 1 tableta (1 mg en total) por va oral diariamente. (Take 1 tablet (1 mg total) by mouth daily.)   furosemide 80  MG tablet Commonly known as: LASIX Tome 1 tableta (80 mg en total) por va oral diariamente. (Take 1 tablet (80 mg total) by mouth daily.) What changed:  medication strength how much to take   lactulose 10 GM/15ML solution Commonly known as: CHRONULAC Take 45 mLs (30 g total) by mouth every 6 (six) hours.   multivitamin with minerals Tabs tablet Take 1 tablet by mouth daily.   pantoprazole 40 MG tablet Commonly known as: PROTONIX Take 1 tablet (40 mg total) by mouth daily at 6 (six) AM.   propranolol 10 MG tablet Commonly known as: INDERAL Take 1 tablet (10 mg total) by mouth 2 (two) times daily.   spironolactone 100 MG tablet Commonly known as: ALDACTONE Take 2 tablets (200 mg total) by mouth daily. What changed: how much to take   sulfamethoxazole-trimethoprim 800-160 MG tablet Commonly known as: BACTRIM DS Tome 1 tableta por va oral diariamente. (Take 1 tablet by mouth daily.) Start taking on: March 05, 2022   thiamine 100 MG tablet Commonly known as: VITAMIN B1 Tome 1 tableta (100 mg en total) por va oral diariamente. (Take 1 tablet (100 mg total) by mouth daily.)        Discharge Instructions: Please refer to Patient Instructions section of EMR for full details.  Patient was counseled important signs and symptoms that should prompt return to medical care, changes in medications, dietary instructions, activity restrictions, and follow up appointments.   Follow-Up Appointments:  Follow-up Information     [name], MD Follow up on 03/15/2022.   Specialty: Pulmonary Disease Why: 10:50 AM Contact information: [address] [place] [address] [phone]                 Lowry Ram, MD 03/03/2022, 10:18 AM PGY-1, [place]

## 2022-03-03 NOTE — TOC Transition Note (Signed)
Transition of Care [facility]) - CM/SW Discharge Note   Patient Details  Name: Mathew [name] MRN: [id number] Date of Birth: May 20, 1980  Transition of Care [facility] CM/SW Contact:  Tom-Johnson, Renea Ee, RN Phone Number: 03/03/2022, 10:47 AM   Clinical Narrative:     Patient is scheduled for discharge today. Hospital f/u info and outpatient instructions on AVS. Prescription sent to [place] and meds to be delivered to patient at bedside prior to discharge. No further TOC needs noted.       Final next level of care: Home/Self Care Barriers to Discharge: Barriers Resolved   Patient Goals and CMS Choice CMS Medicare.gov Compare Post Acute Care list provided to:: Patient Choice offered to / list presented to : Patient  Discharge Placement                  Patient to be transferred to facility by: Family      Discharge Plan and Services Additional resources added to the After Visit Summary for                  DME Arranged: N/A DME Agency: NA       HH Arranged: NA HH Agency: NA        Social Determinants of Health (SDOH) Interventions SDOH Screenings   Food Insecurity: No Food Insecurity (02/19/2022)  Housing: Low Risk  (02/19/2022)  Transportation Needs: No Transportation Needs (02/19/2022)  Utilities: Not At Risk (02/19/2022)  Depression (PHQ2-9): Low Risk  (11/11/2021)  Tobacco Use: High Risk (03/01/2022)     Readmission Risk Interventions     No data to display

## 2022-03-03 NOTE — Progress Notes (Signed)
Occupational Therapy Treatment Patient Details Name: Mathew [name] MRN: [id number] DOB: [birth year] Today's Date: 03/03/2022   History of present illness 42 y.o. male presents to [facility] hospital with abdominal pain and edema. Pt admitted for management of suspected spontaneous bacterial peritonitis 2/2 decompensated hepatic cirrhosis. PMH: alcohol cirrhosis with recurrent ascites, ETOH abuse   OT comments  Patient seen to address AE training for LB dressing. Patient instructed on reacher, sock aide, dressing stick, and LH shoehorn to assist with LB dressing. Patient demonstrated good understanding of use and was able to return demonstration with supervision. Patient has no needs for continued OT following discharge.    Recommendations for follow up therapy are one component of a multi-disciplinary discharge planning process, led by the attending physician.  Recommendations may be updated based on patient status, additional functional criteria and insurance authorization.    Follow Up Recommendations  No OT follow up     Assistance Recommended at Discharge None  Patient can return home with the following  Assist for transportation   Equipment Recommendations  None recommended by OT    Recommendations for Other Services      Precautions / Restrictions Precautions Precautions: Fall Precaution Comments: ascites, LE edema Restrictions Weight Bearing Restrictions: No       Mobility Bed Mobility Overal bed mobility: Needs Assistance Bed Mobility: Supine to Sit, Sit to Supine     Supine to sit: Supervision Sit to supine: Supervision   General bed mobility comments: no physical assistance needed with bed mobility    Transfers Overall transfer level: Needs assistance Equipment used: None Transfers: Sit to/from Stand Sit to Stand: Supervision           General transfer comment: supervision for saety     Balance Overall balance assessment: Needs  assistance Sitting-balance support: No upper extremity supported, Feet supported Sitting balance-Leahy Scale: Good     Standing balance support: No upper extremity supported, During functional activity Standing balance-Leahy Scale: Good                             ADL either performed or assessed with clinical judgement   ADL Overall ADL's : Needs assistance/impaired     Grooming: Wash/dry hands;Supervision/safety;Standing               Lower Body Dressing: Supervision/safety;Sitting/lateral leans Lower Body Dressing Details (indicate cue type and reason): education on use of sock aide, reacher, dressing stick, and LH shoehorn               General ADL Comments: patient demonstrated good understanding of AE use    Extremity/Trunk Assessment              Vision       Perception     Praxis      Cognition Arousal/Alertness: Awake/alert Behavior During Therapy: WFL for tasks assessed/performed Overall Cognitive Status: Within Functional Limits for tasks assessed                                          Exercises      Shoulder Instructions       General Comments video interpretor Vaughan Basta [phone] utilized during visit    Pertinent Vitals/ Pain       Pain Assessment Pain Assessment: No/denies pain  Home Living  Prior Functioning/Environment              Frequency  Min 2X/week        Progress Toward Goals  OT Goals(current goals can now be found in the care plan section)  Progress towards OT goals: Progressing toward goals  Acute Rehab OT Goals Patient Stated Goal: get better OT Goal Formulation: With patient Time For Goal Achievement: 03/15/22 Potential to Achieve Goals: Good ADL Goals Pt Will Perform Grooming: Independently;standing Pt Will Perform Lower Body Bathing: with modified independence;sit to/from stand;sitting/lateral leans;with  adaptive equipment Pt Will Perform Lower Body Dressing: with modified independence;sitting/lateral leans;sit to/from stand;with adaptive equipment  Plan Discharge plan remains appropriate    Co-evaluation                 AM-PAC OT "6 Clicks" Daily Activity     Outcome Measure   Help from another person eating meals?: None Help from another person taking care of personal grooming?: None Help from another person toileting, which includes using toliet, bedpan, or urinal?: None Help from another person bathing (including washing, rinsing, drying)?: A Little Help from another person to put on and taking off regular upper body clothing?: None Help from another person to put on and taking off regular lower body clothing?: A Little 6 Click Score: 22    End of Session    OT Visit Diagnosis: Unsteadiness on feet (R26.81);Muscle weakness (generalized) (M62.81)   Activity Tolerance Patient tolerated treatment well   Patient Left in bed;with call bell/phone within reach   Nurse Communication Mobility status        Time: [id number] OT Time Calculation (min): 29 min  Charges: OT General Charges $OT Visit: 1 Visit OT Treatments $Self Care/Home Management : 23-37 mins  Lodema Hong, [place]  Office [place] 03/03/2022, 3:13 PM

## 2022-03-03 NOTE — Discharge Instructions (Addendum)
Dear Mathew [name],   Thank you so much for allowing [place] to be part of your care!  You were admitted to [facility] for fluid overload due to alcoholic cirrhosis and were treated for spontaneous bacterial peritonitis with antibiotics.   POST-HOSPITAL & CARE INSTRUCTIONS Avoid drinking alcohol We have stopped ciprofloxacin and changed this to bactrim for SBP prophylaxis. You have 1 more day of Augmentin and after this you can start taking bactrim daily. Continue your medicines as prescribed Please let PCP/Specialists know of any changes that were made.  Please see medications section of this packet for any medication changes.   DOCTOR'S APPOINTMENT & FOLLOW UP CARE INSTRUCTIONS  Future Appointments  Date Time Provider [place]  03/15/2022 10:50 AM [name], MD [facility] None    RETURN PRECAUTIONS: Shortness of breath, belly pain, fevers  Take care and be well!  [facility]  [place], [place] [address] [phone]   _______________________________________________________________________________________________  Mathew [name],  Muchas gracias por permitirnos ser parte de su cuidado! Usted fue ingresado en [place] por sobrecarga de lquidos debido a cirrosis alcohlica y fue tratado por peritonitis bacteriana espontnea con antibiticos.  INSTRUCCIONES DE CUIDADO Y POSHOSPITALARIO 1. Evite beber alcohol 2. Hemos suspendido la ciprofloxacina y la hemos cambiado a bactrim para la profilaxis de la PAS. Tienes 1 da ms de Augmentin y despus de este puedes empezar a tomar bactrim diariamente. 3. Contine con sus medicamentos segn lo recetado. 4. Informe al PCP/especialistas sobre cualquier cambio que se haya realizado. 5. Consulte la seccin de medicamentos de este paquete para conocer cualquier [employer].  [place] Nombramientos futuros Date Time Provider [place]  03/15/2022 10:50 AM [name], MD [facility] None   PRECAUCIONES DE DEVOLUCIN: Dificultad para respirar, dolor de [place], fiebre.  Cudate y que ests bien!  [facility] conmemorativo del cono de Moiss Garey [place], [place] [phone]

## 2022-03-05 LAB — CYTOLOGY - NON PAP

## 2022-03-06 LAB — CULTURE, BODY FLUID W GRAM STAIN -BOTTLE: Culture: NO GROWTH

## 2022-03-07 ENCOUNTER — Telehealth: Payer: Self-pay

## 2022-03-07 NOTE — Transitions of Care (Post Inpatient/ED Visit) (Signed)
   03/07/2022  Name: Azlan Augusto [name] MRN: [id number] DOB: June 12, 1980  Today's TOC FU Call Status: Today's TOC FU Call Status:: Unsuccessul Call (1st Attempt) Unsuccessful Call (1st Attempt) Date: 03/07/22 call placed with assistance of [employer]  Attempted to reach the patient regarding the most recent Inpatient/ED visit.  Follow Up Plan: Additional outreach attempts will be made to reach the patient to complete the Transitions of Care (Post Inpatient/ED visit) call.   Signature Eden Lathe, RN

## 2022-03-08 ENCOUNTER — Telehealth: Payer: Self-pay

## 2022-03-08 NOTE — Transitions of Care (Post Inpatient/ED Visit) (Signed)
   03/08/2022  Name: Mathew [name] MRN: [id number] DOB: [birth year]  Today's TOC FU Call Status: Today's TOC FU Call Status:: Unsuccessful Call (2nd Attempt) Unsuccessful Call (1st Attempt) Date: 03/07/22 Unsuccessful Call (2nd Attempt) Date: 03/08/22  Attempted to reach the patient regarding the most recent Inpatient/ED visit.  Follow Up Plan: Additional outreach attempts will be made to reach the patient to complete the Transitions of Care (Post Inpatient/ED visit) call.   Call was placed with the assistance of [employer] [id number]/[employer].   Signature Eden Lathe, RN

## 2022-03-09 ENCOUNTER — Telehealth: Payer: Self-pay

## 2022-03-09 NOTE — Transitions of Care (Post Inpatient/ED Visit) (Signed)
   03/09/2022  Name: Mathew [name] MRN: [id number] DOB: 18-Aug-1980  Today's TOC FU Call Status: Today's TOC FU Call Status:: Successful TOC FU Call Competed Unsuccessful Call (1st Attempt) Date: 03/07/22 Unsuccessful Call (2nd Attempt) Date: 03/08/22 [facility] FU Call Complete Date: 03/09/22 Call completed with assistance of Spanish Interpreter [id number]/[employer]  Transition Care Management Follow-up Telephone Call Date of Discharge: 03/03/22 Discharge Facility: [facility]) Type of Discharge: Inpatient Admission Primary Inpatient Discharge Diagnosis:: SBP How have you been since you were released from the hospital?: Better Any questions or concerns?: Yes Patient Questions/Concerns:: His only concern is that his feet and legs below his hips are still swollen but he said they are much better than when he was in the hospital. the swelling improves when he is laying down at night. Patient Questions/Concerns Addressed: Notified Provider of Patient Questions/Concerns  Items Reviewed: Did you receive and understand the discharge instructions provided?: Yes Medications obtained and verified?: Yes (Medications Reviewed) (he said he has all of the medications and he will bring them to his appointment.  He didn't have any questions about the med regime) Any new allergies since your discharge?: No Dietary orders reviewed?: Yes Type of Diet Ordered:: heart healthy- he said he is really trying to avoid salt. Do you have support at home?: Yes Name of Support/Comfort Primary Source: he said he has people around but does not need any help  Home Care and Equipment/Supplies: [place] Ordered?: No Any new equipment or medical supplies ordered?: No  Functional Questionnaire: Do you need assistance with bathing/showering or dressing?: No Do you need assistance with meal preparation?: No Do you need assistance with eating?: No Do you have difficulty maintaining  continence: No Do you need assistance with getting out of bed/getting out of a chair/moving?: No Do you have difficulty managing or taking your medications?: No  Folllow up appointments reviewed: PCP Follow-up appointment confirmed?: Yes Date of PCP follow-up appointment?: 03/15/22 Follow-up Provider: Dr Joya Gaskins [facility] Follow-up appointment confirmed?: NA Do you need transportation to your follow-up appointment?: No Do you understand care options if your condition(s) worsen?: Yes-patient verbalized understanding    SIGNATURE Eden Lathe, RN

## 2022-03-12 NOTE — Progress Notes (Unsigned)
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 mild concentric left ventricular hypertrophy. Left ventricular diastolic parameters are consistent with Grade I diastolic dysfunction (impaired relaxation).  2. Right ventricular systolic function is hyperdynamic. The right ventricular size is normal. There is normal pulmonary artery systolic pressure.  3. Left atrial size was mildly dilated.  4. The mitral valve is normal in structure. No evidence of mitral valve regurgitation. No evidence of mitral stenosis.  5. The aortic valve is tricuspid. Aortic valve regurgitation is not visualized. No aortic stenosis is present.  6. The inferior vena cava is normal in size with greater than 50% respiratory variability, suggesting right atrial pressure of 3 mmHg. Comparison(s): No significant change from prior study. FINDINGS  Left Ventricle: Left ventricular ejection fraction, by estimation, is 65 to 70%. The left ventricle has normal function. The left ventricle has no regional wall motion abnormalities. The left ventricular internal cavity size was normal in size. There is  mild concentric  left ventricular hypertrophy. Left ventricular diastolic parameters are consistent with Grade I diastolic dysfunction (impaired relaxation). Right Ventricle:  The right ventricular size is normal. No increase in right ventricular wall thickness. Right ventricular systolic function is hyperdynamic. There is normal pulmonary artery systolic pressure. The tricuspid regurgitant velocity is 2.30 m/s, and with an assumed right atrial pressure of 3 mmHg, the estimated right ventricular systolic pressure is [id number] mmHg. Left Atrium: Left atrial size was mildly dilated. Right Atrium: Right atrial size was normal in size. Pericardium: There is no evidence of pericardial effusion. Mitral Valve: The mitral valve is normal in structure. No evidence of mitral valve regurgitation. No evidence of mitral valve stenosis. Tricuspid Valve: The tricuspid valve is normal in structure. Tricuspid valve regurgitation is mild . No evidence of tricuspid stenosis. Aortic Valve: The aortic valve is tricuspid. Aortic valve regurgitation is not visualized. No aortic stenosis is present. Aortic valve mean gradient measures 7.0 mmHg. Aortic valve peak gradient measures 14.4 mmHg. Aortic valve area, by VTI measures 2.11  cm. Pulmonic Valve: The pulmonic valve was not well visualized. Pulmonic valve regurgitation is not visualized. Aorta: The aortic root is normal in size and structure and the ascending aorta was not well visualized. Venous: The inferior vena cava is normal in size with greater than 50% respiratory variability, suggesting right atrial pressure of 3 mmHg. IAS/Shunts: No atrial level shunt detected by color flow Doppler.  LEFT VENTRICLE PLAX 2D LVIDd:         4.60 cm   Diastology LVIDs:         2.50 cm   LV e' medial:    8.05 cm/s LV PW:         1.20 cm   LV E/e' medial:  7.3 LV IVS:        1.20 cm   LV e' lateral:   9.14 cm/s LVOT diam:     1.90 cm   LV E/e' lateral: 6.5 LV SV:         73 LV SV Index:   38 LVOT Area:     2.84 cm  RIGHT  VENTRICLE RV S prime:     23.50 cm/s TAPSE (M-mode): 2.4 cm LEFT ATRIUM             Index        RIGHT ATRIUM           Index LA diam:        4.70 cm 2.46 cm/m   RA Area:     20.30 cm LA Vol (A2C):   55.8 ml 29.24 ml/m  RA Volume:   58.90 ml  30.87 ml/m LA Vol (A4C):   70.3 ml 36.84 ml/m LA Biplane Vol: 62.9 ml 32.97 ml/m  AORTIC VALVE AV Area (Vmax):    1.72 cm AV Area (Vmean):   2.00 cm AV Area (VTI):     2.11 cm AV Vmax:           190.00 cm/s AV Vmean:          117.500 cm/s AV VTI:            0.349 m AV Peak Grad:      14.4 mmHg AV Mean Grad:      7.0 mmHg LVOT Vmax:         115.00 cm/s LVOT Vmean:        82.900 cm/s LVOT VTI:          0.259 m LVOT/AV VTI ratio: 0.74  AORTA Ao Root diam: 3.10 cm MITRAL VALVE  TRICUSPID VALVE MV Area (PHT): 2.66 cm    TR Peak grad:   21.2 mmHg MV Decel Time: 285 msec    TR Vmax:        230.00 cm/s MV E velocity: 59.10 cm/s MV A velocity: 75.20 cm/s  SHUNTS MV E/A ratio:  0.79        Systemic VTI:  0.26 m                            Systemic Diam: 1.90 cm [name] MD Electronically signed by [name] MD Signature Date/Time: 11/06/2021/3:59:57 PM    Final     [place] ASCITES (ABDOMEN LIMITED)   Result Date: 11/06/2021 CLINICAL DATA:  Assess ascites. EXAM: LIMITED ABDOMEN ULTRASOUND FOR ASCITES TECHNIQUE: Limited ultrasound survey for ascites was performed in all four abdominal quadrants. COMPARISON:  None Available. FINDINGS: A small amount of ascites is identified throughout the abdomen. No drainable collection of ascites is noted. IMPRESSION: Small amount of ascites. The amount of ascites is not amenable to paracentesis. Electronically Signed   By: Dorise Bullion III M.D.   On: 11/06/2021 10:13    CT Head Wo Contrast   Result Date: 11/05/2021 CLINICAL DATA:  Cirrhosis, altered level of consciousness, confusion EXAM: CT HEAD WITHOUT CONTRAST TECHNIQUE: Contiguous axial images were obtained from the base of the skull through the  vertex without intravenous contrast. RADIATION DOSE REDUCTION: This exam was performed according to the departmental dose-optimization program which includes automated exposure control, adjustment of the mA and/or kV according to patient size and/or use of iterative reconstruction technique. COMPARISON:  06/30/2021 FINDINGS: Brain: No acute infarct or hemorrhage. Lateral ventricles and midline structures are unremarkable. No acute extra-axial fluid collections. No mass effect. Vascular: No hyperdense vessel or unexpected calcification. Skull: Normal. Negative for fracture or focal lesion. Sinuses/Orbits: No acute finding. Other: None. IMPRESSION: 1. No acute intracranial process. Electronically Signed   By: Randa Ngo M.D.   On: 11/05/2021 18:08    CT ABDOMEN PELVIS W CONTRAST   Result Date: 11/05/2021 CLINICAL DATA:  Acute abdominal pain. History of cirrhosis and ascites. EXAM: CT ABDOMEN AND PELVIS WITH CONTRAST TECHNIQUE: Multidetector CT imaging of the abdomen and pelvis was performed using the standard protocol following bolus administration of intravenous contrast. RADIATION DOSE REDUCTION: This exam was performed according to the departmental dose-optimization program which includes automated exposure control, adjustment of the mA and/or kV according to patient size and/or use of iterative reconstruction technique. CONTRAST:  20m OMNIPAQUE IOHEXOL 350 MG/ML SOLN COMPARISON:  CT abdomen and pelvis 09/01/2021 FINDINGS: Lower chest: No acute abnormality. Hepatobiliary: Gallstones are present. Mild nodular liver contour persists. No focal liver lesions are seen. Gallstones are present. There is no biliary ductal dilatation. Pancreas: Unremarkable. No pancreatic ductal dilatation or surrounding inflammatory changes. Spleen: Borderline enlarged, unchanged. Adrenals/Urinary Tract: Adrenal glands are unremarkable. Kidneys are normal, without renal calculi, focal lesion, or hydronephrosis. Bladder is  unremarkable. Stomach/Bowel: There is diffuse gaseous distention of the colon without dilatation. There is no focal wall thickening or inflammation. The appendix, small bowel and stomach are within normal limits. Vascular/Lymphatic: No significant vascular findings are present. No enlarged abdominal or pelvic lymph nodes. Reproductive: Prostate is unremarkable. Other: No abdominal wall hernia or abnormality. No abdominopelvic ascites. Musculoskeletal: No acute or significant osseous findings. IMPRESSION: 1. No acute localizing process in the abdomen or pelvis. 2. Stable findings of cirrhosis and portal hypertension. 3. Cholelithiasis. Electronically Signed  By: Ronney Asters M.D.   On: 11/05/2021 18:08    DG Chest 2 View   Result Date: 11/05/2021 CLINICAL DATA:  Altered mental status EXAM: CHEST - 2 VIEW COMPARISON:  07/19/2021 FINDINGS: Transverse diameter of heart is increased. There are no signs of pulmonary edema or focal pulmonary consolidation. There is no pleural effusion or pneumothorax. IMPRESSION: There are no focal infiltrates or signs of pulmonary edema. Electronically Signed   By: Elmer Picker M.D.   On: 11/05/2021 13:27     The ASCVD Risk score (Arnett DK, et al., 2019) failed to calculate for the following reasons:   Cannot find a previous HDL lab   Cannot find a previous total cholesterol lab    Assessment & Plan:   Problem List Items Addressed This Visit   None No follow-ups on file.  Extra time needed for Spanish language interpretation Flu vaccine given Asencion Noble, MD

## 2022-03-15 ENCOUNTER — Encounter: Payer: Self-pay | Admitting: Critical Care Medicine

## 2022-03-15 ENCOUNTER — Ambulatory Visit: Payer: Self-pay | Attending: Critical Care Medicine | Admitting: Critical Care Medicine

## 2022-03-15 ENCOUNTER — Other Ambulatory Visit: Payer: Self-pay

## 2022-03-15 VITALS — BP 124/74 | HR 82 | Wt 182.0 lb

## 2022-03-15 DIAGNOSIS — D61818 Other pancytopenia: Secondary | ICD-10-CM

## 2022-03-15 DIAGNOSIS — K7031 Alcoholic cirrhosis of liver with ascites: Secondary | ICD-10-CM

## 2022-03-15 DIAGNOSIS — K652 Spontaneous bacterial peritonitis: Secondary | ICD-10-CM

## 2022-03-15 DIAGNOSIS — D696 Thrombocytopenia, unspecified: Secondary | ICD-10-CM

## 2022-03-15 DIAGNOSIS — E871 Hypo-osmolality and hyponatremia: Secondary | ICD-10-CM

## 2022-03-15 DIAGNOSIS — R7401 Elevation of levels of liver transaminase levels: Secondary | ICD-10-CM

## 2022-03-15 DIAGNOSIS — R601 Generalized edema: Secondary | ICD-10-CM

## 2022-03-15 MED ORDER — FUROSEMIDE 80 MG PO TABS
80.0000 mg | ORAL_TABLET | Freq: Every day | ORAL | 2 refills | Status: DC
  Filled 2022-03-15: qty 60, 60d supply, fill #0

## 2022-03-15 MED ORDER — SPIRONOLACTONE 100 MG PO TABS
200.0000 mg | ORAL_TABLET | Freq: Every day | ORAL | 2 refills | Status: DC
  Filled 2022-03-15: qty 60, 30d supply, fill #0

## 2022-03-15 NOTE — Assessment & Plan Note (Signed)
Follow-up CBC ?

## 2022-03-15 NOTE — Assessment & Plan Note (Signed)
Follow-up liver function

## 2022-03-15 NOTE — Assessment & Plan Note (Signed)
Cirrhotic liver disease with ascites improved ascites with diuretic program continue same  Alcohol abstinence recommended

## 2022-03-15 NOTE — Patient Instructions (Signed)
No change in medications you have refills on all medicines at our pharmacy downstairs  Labs today include metabolic panel and blood counts  Stay off all alcohol products  Return to Dr. Joya Gaskins 2 months

## 2022-03-15 NOTE — Assessment & Plan Note (Signed)
Follow-up metabolic panel

## 2022-03-15 NOTE — Assessment & Plan Note (Signed)
Complete course of Bactrim

## 2022-03-15 NOTE — Assessment & Plan Note (Signed)
Improved with diuresis

## 2022-03-16 ENCOUNTER — Telehealth: Payer: Self-pay

## 2022-03-16 LAB — BMP8+EGFR
BUN/Creatinine Ratio: 7 — ABNORMAL LOW (ref 9–20)
BUN: 5 mg/dL — ABNORMAL LOW (ref 6–24)
CO2: 22 mmol/L (ref 20–29)
Calcium: 8.1 mg/dL — ABNORMAL LOW (ref 8.7–10.2)
Chloride: 101 mmol/L (ref 96–106)
Creatinine, Ser: 0.75 mg/dL — ABNORMAL LOW (ref 0.76–1.27)
Glucose: 90 mg/dL (ref 70–99)
Potassium: 3.8 mmol/L (ref 3.5–5.2)
Sodium: 135 mmol/L (ref 134–144)
eGFR: 115 mL/min/{1.73_m2} (ref >59)

## 2022-03-16 LAB — CBC WITH DIFFERENTIAL/PLATELET
Basophils Absolute: 0 10*3/uL (ref 0.0–0.2)
Basos: 0 %
EOS (ABSOLUTE): 0.1 10*3/uL (ref 0.0–0.4)
Eos: 2 %
Hematocrit: 29.1 % — ABNORMAL LOW (ref 37.5–51.0)
Hemoglobin: 10.4 g/dL — ABNORMAL LOW (ref 13.0–17.7)
Immature Grans (Abs): 0 10*3/uL (ref 0.0–0.1)
Immature Granulocytes: 0 %
Lymphocytes Absolute: 0.9 10*3/uL (ref 0.7–3.1)
Lymphs: 32 %
MCH: 34.7 pg — ABNORMAL HIGH (ref 26.6–33.0)
MCHC: 35.7 g/dL (ref 31.5–35.7)
MCV: 97 fL (ref 79–97)
Monocytes Absolute: 0.4 10*3/uL (ref 0.1–0.9)
Monocytes: 13 %
Neutrophils Absolute: 1.4 10*3/uL (ref 1.4–7.0)
Neutrophils: 53 %
Platelets: 64 10*3/uL — CL (ref 150–450)
RBC: 3 x10E6/uL — ABNORMAL LOW (ref 4.14–5.80)
RDW: 12.8 % (ref 11.6–15.4)
WBC: 2.8 10*3/uL — ABNORMAL LOW (ref 3.4–10.8)

## 2022-03-16 NOTE — Telephone Encounter (Signed)
Called patient unable to make contact or leave voicemail, information sent to the nurse pool    Letter will be sent

## 2022-03-16 NOTE — Progress Notes (Signed)
Call pt and let him know labs are stable avoid all alcohol

## 2022-03-16 NOTE — Telephone Encounter (Signed)
-----   Message from [name], MD sent at 03/16/2022  8:44 AM EST ----- Call pt and let him know labs are stable avoid all alcohol

## 2022-03-16 NOTE — Telephone Encounter (Signed)
Error

## 2022-05-18 ENCOUNTER — Ambulatory Visit: Payer: Self-pay | Attending: Critical Care Medicine | Admitting: Critical Care Medicine

## 2022-05-18 NOTE — Progress Notes (Deleted)
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 Echocardiogram examinations, most                 recent 06/17/2021. Tobacco and ETOH abuse, Cirrhosis of liver.  Sonographer:    Celesta Gentile RCS Referring Phys: Effie Shy [place] [facility] IMPRESSIONS  1. Left ventricular ejection fraction, by estimation, is 65 to 70%. The left ventricle has normal function. The left ventricle has no regional wall motion abnormalities. There is mild concentric left ventricular hypertrophy. Left ventricular diastolic parameters are consistent with Grade I diastolic dysfunction (impaired relaxation).  2. Right ventricular systolic function is hyperdynamic. The right ventricular size is normal. There is normal pulmonary artery systolic pressure.  3. Left atrial size was mildly dilated.  4. The mitral valve is normal in structure. No evidence of mitral valve regurgitation. No evidence of mitral stenosis.  5. The aortic valve is tricuspid. Aortic valve regurgitation is not visualized. No aortic stenosis is present.  6. The inferior vena cava is normal in size with greater than 50% respiratory variability, suggesting right atrial pressure of 3 mmHg.  Comparison(s): No  significant change from prior study. FINDINGS  Left Ventricle: Left ventricular ejection fraction, by estimation, is 65 to 70%. The left ventricle has normal function. The left ventricle has no regional wall motion abnormalities. The left ventricular internal cavity size was normal in size. There is  mild concentric left ventricular hypertrophy. Left ventricular diastolic parameters are consistent with Grade I diastolic dysfunction (impaired relaxation). Right Ventricle: The right ventricular size is normal. No increase in right ventricular wall thickness. Right ventricular systolic function is hyperdynamic. There is normal pulmonary artery systolic pressure. The tricuspid regurgitant velocity is 2.30 m/s, and with an assumed right atrial pressure of 3 mmHg, the estimated right ventricular systolic pressure is 24.2 mmHg. Left Atrium: Left atrial size was mildly dilated. Right Atrium: Right atrial size was normal in size. Pericardium: There is no evidence of pericardial effusion. Mitral Valve: The mitral valve is normal in structure. No evidence of mitral valve regurgitation. No evidence of mitral valve stenosis. Tricuspid Valve: The tricuspid valve is normal in structure. Tricuspid valve regurgitation is mild . No evidence of tricuspid stenosis. Aortic Valve: The aortic valve is tricuspid. Aortic valve regurgitation is not visualized. No aortic stenosis is present. Aortic valve mean gradient measures 7.0 mmHg. Aortic valve peak gradient measures 14.4 mmHg. Aortic valve area, by VTI measures 2.11  cm. Pulmonic Valve: The pulmonic valve was not well visualized. Pulmonic valve regurgitation is not visualized. Aorta: The aortic root is normal in size and structure and the ascending aorta was not well visualized. Venous: The inferior vena cava is normal in size with greater than 50% respiratory variability, suggesting right atrial pressure of 3 mmHg. IAS/Shunts: No atrial level shunt detected by color  flow Doppler.  LEFT VENTRICLE PLAX 2D LVIDd:         4.60 cm   Diastology LVIDs:         2.50 cm   LV e' medial:    8.05 cm/s LV PW:         1.20 cm   LV E/e' medial:  7.3 LV IVS:        1.20 cm   LV e' lateral:   9.14 cm/s LVOT diam:     1.90 cm   LV E/e' lateral: 6.5 LV SV:         73 LV SV Index:   38 LVOT Area:     2.84 cm  RIGHT VENTRICLE RV S prime:     23.50 cm/s TAPSE (M-mode): 2.4 cm LEFT ATRIUM             Index        RIGHT ATRIUM           Index LA diam:        4.70 cm 2.46 cm/m   RA Area:     20.30 cm LA Vol (A2C):   55.8 ml 29.24 ml/m  RA Volume:   58.90 ml  30.87 ml/m LA Vol (A4C):   70.3 ml 36.84 ml/m LA Biplane Vol: 62.9 ml 32.97 ml/m  AORTIC VALVE AV Area (Vmax):    1.72 cm AV Area (Vmean):   2.00 cm AV Area (VTI):     2.11 cm AV Vmax:           190.00 cm/s AV Vmean:          117.500 cm/s AV VTI:            0.349 m AV Peak Grad:      14.4 mmHg AV Mean  Grad:      7.0 mmHg LVOT Vmax:         115.00 cm/s LVOT Vmean:        82.900 cm/s LVOT VTI:          0.259 m LVOT/AV VTI ratio: 0.74  AORTA Ao Root diam: 3.10 cm MITRAL VALVE               TRICUSPID VALVE MV Area (PHT): 2.66 cm    TR Peak grad:   21.2 mmHg MV Decel Time: 285 msec    TR Vmax:        230.00 cm/s MV E velocity: 59.10 cm/s MV A velocity: 75.20 cm/s  SHUNTS MV E/A ratio:  0.79        Systemic VTI:  0.26 m                            Systemic Diam: 1.90 cm Riley Lam MD Electronically signed by Riley Lam MD Signature Date/Time: 11/06/2021/3:59:57 PM    Final     [place] ASCITES (ABDOMEN LIMITED)   Result Date: 11/06/2021 CLINICAL DATA:  Assess ascites. EXAM: LIMITED ABDOMEN ULTRASOUND FOR ASCITES TECHNIQUE: Limited ultrasound survey for ascites was performed in all four abdominal quadrants. COMPARISON:  None Available. FINDINGS: A small amount of ascites is identified throughout the abdomen. No drainable collection of ascites is noted. IMPRESSION: Small amount of ascites. The amount of ascites is not amenable  to paracentesis. Electronically Signed   By: Gerome Sam III M.D.   On: 11/06/2021 10:13    CT Head Wo Contrast   Result Date: 11/05/2021 CLINICAL DATA:  Cirrhosis, altered level of consciousness, confusion EXAM: CT HEAD WITHOUT CONTRAST TECHNIQUE: Contiguous axial images were obtained from the base of the skull through the vertex without intravenous contrast. RADIATION DOSE REDUCTION: This exam was performed according to the departmental dose-optimization program which includes automated exposure control, adjustment of the mA and/or kV according to patient size and/or use of iterative reconstruction technique. COMPARISON:  06/30/2021 FINDINGS: Brain: No acute infarct or hemorrhage. Lateral ventricles and midline structures are unremarkable. No acute extra-axial fluid collections. No mass effect. Vascular: No hyperdense vessel or unexpected calcification. Skull: Normal. Negative for fracture or focal lesion. Sinuses/Orbits: No acute finding. Other: None. IMPRESSION: 1. No acute intracranial process. Electronically Signed   By: Sharlet Salina M.D.   On: 11/05/2021 18:08    CT ABDOMEN PELVIS W CONTRAST   Result Date: 11/05/2021 CLINICAL DATA:  Acute abdominal pain. History of cirrhosis and ascites. EXAM: CT ABDOMEN AND PELVIS WITH CONTRAST TECHNIQUE: Multidetector CT imaging of the abdomen and pelvis was performed using the standard protocol following bolus administration of intravenous contrast. RADIATION DOSE REDUCTION: This exam was performed according to the departmental dose-optimization program which includes automated exposure control, adjustment of the mA and/or kV according to patient size and/or use of iterative reconstruction technique. CONTRAST:  75mL OMNIPAQUE IOHEXOL 350 MG/ML SOLN COMPARISON:  CT abdomen and pelvis 09/01/2021 FINDINGS: Lower chest: No acute abnormality. Hepatobiliary: Gallstones are present. Mild nodular liver contour persists. No focal liver lesions are seen. Gallstones  are present. There is no biliary ductal dilatation. Pancreas: Unremarkable. No pancreatic ductal dilatation or surrounding inflammatory changes. Spleen: Borderline enlarged, unchanged. Adrenals/Urinary Tract: Adrenal glands are unremarkable. Kidneys are normal, without renal calculi, focal lesion, or hydronephrosis. Bladder is unremarkable. Stomach/Bowel: There is diffuse gaseous distention of the colon without dilatation. There is no focal wall thickening or  inflammation. The appendix, small bowel and stomach are within normal limits. Vascular/Lymphatic: No significant vascular findings are present. No enlarged abdominal or pelvic lymph nodes. Reproductive: Prostate is unremarkable. Other: No abdominal wall hernia or abnormality. No abdominopelvic ascites. Musculoskeletal: No acute or significant osseous findings. IMPRESSION: 1. No acute localizing process in the abdomen or pelvis. 2. Stable findings of cirrhosis and portal hypertension. 3. Cholelithiasis. Electronically Signed   By: Darliss Cheney M.D.   On: 11/05/2021 18:08    DG Chest 2 View   Result Date: 11/05/2021 CLINICAL DATA:  Altered mental status EXAM: CHEST - 2 VIEW COMPARISON:  07/19/2021 FINDINGS: Transverse diameter of heart is increased. There are no signs of pulmonary edema or focal pulmonary consolidation. There is no pleural effusion or pneumothorax. IMPRESSION: There are no focal infiltrates or signs of pulmonary edema. Electronically Signed   By: Ernie Avena M.D.   On: 11/05/2021 13:27     The ASCVD Risk score (Arnett DK, et al., 2019) failed to calculate for the following reasons:   Cannot find a previous HDL lab   Cannot find a previous total cholesterol lab    Assessment & Plan:   Problem List Items Addressed This Visit   None No follow-ups on file.  Extra time needed for Spanish language interpretation Shan Levans, MD

## 2022-05-23 ENCOUNTER — Ambulatory Visit: Payer: Self-pay | Admitting: *Deleted

## 2022-05-23 NOTE — Telephone Encounter (Signed)
Message from Arther Dames sent at 05/23/2022  3:29 PM EDT  Summary: Referral   Patient called to see where he needed to be scheduled to have the fluid removed from his abdomin. Patient states that he is unable to work until it is removed. Please advise.          Call History   Type Contact Phone/Fax User  05/23/2022 03:27 PM EDT Phone (Incoming) [name], [place] (Self) [phone] Mabe, Hennie Duos

## 2022-05-23 NOTE — Telephone Encounter (Addendum)
Attempted to return his call using [employer] [place] [phone].  Left a voicemail to call back.

## 2022-05-23 NOTE — Telephone Encounter (Signed)
Summary: Referral   Patient called to see where he needed to be scheduled to have the fluid removed from his abdomin. Patient states that he is unable to work until it is removed. Please advise.       Called patient via interpreter Vikki Ports ID [phone]  on [phone] and unable to dial out . Called [phone] and no answer, LVMTCB [phone].

## 2022-05-23 NOTE — Telephone Encounter (Signed)
Summary: Referral   Patient called to see where he needed to be scheduled to have the fluid removed from his abdomin. Patient states that he is unable to work until it is removed. Please advise.     Called pt  - left message on machine to return call. Forwarding encounter to clinic for follow up.

## 2022-05-24 NOTE — Telephone Encounter (Signed)
See if we can get ahold of him to add him on today or tomorrow

## 2022-05-24 NOTE — Telephone Encounter (Signed)
Appointment for 05/25/2022.

## 2022-05-25 ENCOUNTER — Other Ambulatory Visit: Payer: Self-pay | Admitting: Critical Care Medicine

## 2022-05-25 ENCOUNTER — Other Ambulatory Visit (HOSPITAL_COMMUNITY): Payer: Self-pay

## 2022-05-25 ENCOUNTER — Ambulatory Visit: Payer: Self-pay | Attending: Critical Care Medicine | Admitting: Critical Care Medicine

## 2022-05-25 ENCOUNTER — Encounter: Payer: Self-pay | Admitting: Critical Care Medicine

## 2022-05-25 VITALS — BP 131/71 | HR 86 | Temp 98.2°F | Ht 61.0 in | Wt 183.0 lb

## 2022-05-25 DIAGNOSIS — K652 Spontaneous bacterial peritonitis: Secondary | ICD-10-CM

## 2022-05-25 DIAGNOSIS — K7290 Hepatic failure, unspecified without coma: Secondary | ICD-10-CM

## 2022-05-25 DIAGNOSIS — K7031 Alcoholic cirrhosis of liver with ascites: Secondary | ICD-10-CM

## 2022-05-25 DIAGNOSIS — K7460 Unspecified cirrhosis of liver: Secondary | ICD-10-CM

## 2022-05-25 DIAGNOSIS — R7401 Elevation of levels of liver transaminase levels: Secondary | ICD-10-CM

## 2022-05-25 DIAGNOSIS — K7030 Alcoholic cirrhosis of liver without ascites: Secondary | ICD-10-CM

## 2022-05-25 LAB — COMPREHENSIVE METABOLIC PANEL
ALT: 45 IU/L — ABNORMAL HIGH (ref 0–44)
AST: 109 IU/L — ABNORMAL HIGH (ref 0–40)
Albumin/Globulin Ratio: 0.9 — ABNORMAL LOW (ref 1.2–2.2)
Albumin: 3 g/dL — ABNORMAL LOW (ref 4.1–5.1)
Alkaline Phosphatase: 198 IU/L — ABNORMAL HIGH (ref 44–121)
BUN/Creatinine Ratio: 9 (ref 9–20)
BUN: 6 mg/dL (ref 6–24)
Bilirubin Total: 4.6 mg/dL — ABNORMAL HIGH (ref 0.0–1.2)
CO2: 24 mmol/L (ref 20–29)
Calcium: 8.1 mg/dL — ABNORMAL LOW (ref 8.7–10.2)
Chloride: 102 mmol/L (ref 96–106)
Creatinine, Ser: 0.69 mg/dL — ABNORMAL LOW (ref 0.76–1.27)
Globulin, Total: 3.3 g/dL (ref 1.5–4.5)
Glucose: 139 mg/dL — ABNORMAL HIGH (ref 70–99)
Potassium: 3.8 mmol/L (ref 3.5–5.2)
Sodium: 131 mmol/L — ABNORMAL LOW (ref 134–144)
Total Protein: 6.3 g/dL (ref 6.0–8.5)
eGFR: 117 mL/min/{1.73_m2} (ref >59)

## 2022-05-25 LAB — CBC WITH DIFFERENTIAL/PLATELET
Basophils Absolute: 0 10*3/uL (ref 0.0–0.2)
Basos: 0 %
EOS (ABSOLUTE): 0 10*3/uL (ref 0.0–0.4)
Eos: 2 %
Hematocrit: 31.4 % — ABNORMAL LOW (ref 37.5–51.0)
Hemoglobin: 11.1 g/dL — ABNORMAL LOW (ref 13.0–17.7)
Lymphocytes Absolute: 0.5 10*3/uL — ABNORMAL LOW (ref 0.7–3.1)
Lymphs: 27 %
MCH: 35.5 pg — ABNORMAL HIGH (ref 26.6–33.0)
MCHC: 35.4 g/dL (ref 31.5–35.7)
MCV: 100 fL — ABNORMAL HIGH (ref 79–97)
Monocytes Absolute: 0.3 10*3/uL (ref 0.1–0.9)
Monocytes: 15 %
Neutrophils Absolute: 1.1 10*3/uL — ABNORMAL LOW (ref 1.4–7.0)
Neutrophils: 56 %
Platelets: 37 10*3/uL — CL (ref 150–450)
RBC: 3.13 x10E6/uL — ABNORMAL LOW (ref 4.14–5.80)
RDW: 13.2 % (ref 11.6–15.4)
WBC: 2 10*3/uL — CL (ref 3.4–10.8)

## 2022-05-25 LAB — PROTIME-INR
INR: 1.7 — ABNORMAL HIGH (ref 0.9–1.2)
Prothrombin Time: 16.8 s — ABNORMAL HIGH (ref 9.1–12.0)

## 2022-05-25 LAB — LIPASE: Lipase: 53 U/L (ref 13–78)

## 2022-05-25 MED ORDER — FERROUS SULFATE 325 (65 FE) MG PO TABS
325.0000 mg | ORAL_TABLET | Freq: Every day | ORAL | 3 refills | Status: DC
  Filled 2022-05-25 (×2): qty 30, 30d supply, fill #0

## 2022-05-25 MED ORDER — SPIRONOLACTONE 100 MG PO TABS
200.0000 mg | ORAL_TABLET | Freq: Every day | ORAL | 2 refills | Status: DC
  Filled 2022-05-25: qty 60, 30d supply, fill #0

## 2022-05-25 MED ORDER — FUROSEMIDE 80 MG PO TABS
80.0000 mg | ORAL_TABLET | Freq: Every day | ORAL | 2 refills | Status: DC
  Filled 2022-05-25: qty 30, 30d supply, fill #0

## 2022-05-25 MED ORDER — PANTOPRAZOLE SODIUM 40 MG PO TBEC
40.0000 mg | DELAYED_RELEASE_TABLET | Freq: Every day | ORAL | 1 refills | Status: DC
  Filled 2022-05-25: qty 30, 30d supply, fill #0

## 2022-05-25 MED ORDER — PROPRANOLOL HCL 10 MG PO TABS
10.0000 mg | ORAL_TABLET | Freq: Two times a day (BID) | ORAL | 1 refills | Status: DC
  Filled 2022-05-25: qty 60, 30d supply, fill #0

## 2022-05-25 MED ORDER — FOLIC ACID 1 MG PO TABS
1.0000 mg | ORAL_TABLET | Freq: Every day | ORAL | 11 refills | Status: DC
  Filled 2022-05-25: qty 30, 30d supply, fill #0

## 2022-05-25 MED ORDER — THIAMINE HCL 100 MG PO TABS
100.0000 mg | ORAL_TABLET | Freq: Every day | ORAL | 2 refills | Status: DC
  Filled 2022-05-25: qty 30, 30d supply, fill #0

## 2022-05-25 MED ORDER — LACTULOSE 10 GM/15ML PO SOLN
30.0000 g | Freq: Four times a day (QID) | ORAL | 1 refills | Status: DC
  Filled 2022-05-25: qty 1892, 11d supply, fill #0

## 2022-05-25 NOTE — Progress Notes (Unsigned)
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 Musculoskeletal: No acute osseous abnormality IMPRESSION: 1. Liver cirrhosis. Interim development of at least moderate volume abdominopelvic ascites. Splenomegaly. 2. Contracted gallbladder with multiple stones. Questionable small stones within the common bile duct. No intra or extrahepatic biliary dilatation. 3. Fluid in the colon. Possible wall thickening of the right and proximal transverse colon which may be secondary to colitis or portal colopathy. 4. Interval small left effusion with passive atelectasis at the left base. 5. Slightly thick-walled appearance of the urinary bladder, correlate for cystitis. Electronically Signed   By: Jasmine Pang M.D.   On: 11/15/2021 19:24    DG Chest 2 View   Result Date: 11/11/2021 CLINICAL DATA:  Provided history: Chest pain. Additional history provided: Shortness of breath, history of cirrhosis. EXAM: CHEST - 2 VIEW COMPARISON:  Prior chest radiographs 11/05/2021 and earlier. FINDINGS: Heart size within normal limits. No appreciable airspace consolidation. No evidence of pleural effusion or pneumothorax. No acute bony  abnormality identified. IMPRESSION: No evidence of active cardiopulmonary disease. Electronically Signed   By: Jackey Loge D.O.   On: 11/11/2021 13:11  DG ELBOW COMPLETE LEFT (3+VIEW)   Result Date: 11/07/2021 CLINICAL DATA:  Pain after fall this morning. EXAM: LEFT ELBOW - COMPLETE 3+ VIEW COMPARISON:  None Available. FINDINGS: There is no evidence of fracture, dislocation, or joint effusion. There is no evidence of arthropathy or other focal bone abnormality. Soft tissues are unremarkable. IMPRESSION: Negative. Electronically Signed   By: Gerome Sam III M.D.   On: 11/07/2021 09:58    ECHOCARDIOGRAM COMPLETE   Result Date: 11/06/2021    ECHOCARDIOGRAM REPORT   Patient Name:   Mathew [name] Date of Exam: 11/06/2021 Medical Rec #:  [id number]            Height:       65.0 in Accession #:    [id number]           Weight:       183.7 lb Date of Birth:  [birth year]            BSA:          1.908 m Patient Age:    41 years             BP:           110/58 mmHg Patient Gender: M                    HR:           85 bpm. Exam Location:  Inpatient Procedure: 2D Echo, Cardiac Doppler and Color Doppler Indications:    R07.9 Chest Pain  History:        Patient has prior history of Echocardiogram examinations, most                 recent 06/17/2021. Tobacco and ETOH abuse, Cirrhosis of liver.  Sonographer:    Celesta Gentile RCS Referring Phys: Effie Shy [facility] IMPRESSIONS  1. Left ventricular ejection fraction, by estimation, is 65 to 70%. The left ventricle has normal function. The left ventricle has no regional wall motion abnormalities. There is mild concentric left ventricular hypertrophy. Left ventricular diastolic parameters are consistent with Grade I diastolic dysfunction (impaired relaxation).  2. Right ventricular systolic function is hyperdynamic. The right ventricular size is normal. There is normal pulmonary artery systolic pressure.  3. Left atrial size was mildly dilated.  4. The mitral  valve is normal in structure. No evidence of mitral valve regurgitation. No evidence of mitral stenosis.  5. The aortic valve is tricuspid. Aortic valve regurgitation is not visualized. No aortic stenosis is present.  6. The inferior vena cava is normal in size with greater than 50% respiratory variability, suggesting right atrial pressure of 3 mmHg. Comparison(s): No significant change from prior study. FINDINGS  Left Ventricle: Left ventricular ejection fraction, by estimation, is 65 to 70%. The left ventricle has normal function. The left ventricle has no regional wall motion abnormalities. The left ventricular internal cavity size was normal in size. There is  mild concentric left ventricular hypertrophy. Left ventricular diastolic parameters are consistent with Grade I diastolic dysfunction (impaired relaxation). Right Ventricle: The right ventricular size is normal. No increase in right ventricular wall thickness. Right ventricular systolic function is hyperdynamic. There is normal pulmonary artery systolic pressure. The tricuspid regurgitant velocity is 2.30 m/s, and with an assumed right atrial pressure of 3 mmHg, the estimated right ventricular systolic pressure is 24.2 mmHg. Left Atrium: Left atrial size was mildly dilated. Right Atrium: Right atrial size was normal in size. Pericardium: There  is no evidence of pericardial effusion. Mitral Valve: The mitral valve is normal in structure. No evidence of mitral valve regurgitation. No evidence of mitral valve stenosis. Tricuspid Valve: The tricuspid valve is normal in structure. Tricuspid valve regurgitation is mild . No evidence of tricuspid stenosis. Aortic Valve: The aortic valve is tricuspid. Aortic valve regurgitation is not visualized. No aortic stenosis is present. Aortic valve mean gradient measures 7.0 mmHg. Aortic valve peak gradient measures 14.4 mmHg. Aortic valve area, by VTI measures 2.11  cm. Pulmonic Valve: The pulmonic valve was not well  visualized. Pulmonic valve regurgitation is not visualized. Aorta: The aortic root is normal in size and structure and the ascending aorta was not well visualized. Venous: The inferior vena cava is normal in size with greater than 50% respiratory variability, suggesting right atrial pressure of 3 mmHg. IAS/Shunts: No atrial level shunt detected by color flow Doppler.  LEFT VENTRICLE PLAX 2D LVIDd:         4.60 cm   Diastology LVIDs:         2.50 cm   LV e' medial:    8.05 cm/s LV PW:         1.20 cm   LV E/e' medial:  7.3 LV IVS:        1.20 cm   LV e' lateral:   9.14 cm/s LVOT diam:     1.90 cm   LV E/e' lateral: 6.5 LV SV:         73 LV SV Index:   38 LVOT Area:     2.84 cm  RIGHT VENTRICLE RV S prime:     23.50 cm/s TAPSE (M-mode): 2.4 cm LEFT ATRIUM             Index        RIGHT ATRIUM           Index LA diam:        4.70 cm 2.46 cm/m   RA Area:     20.30 cm LA Vol (A2C):   55.8 ml 29.24 ml/m  RA Volume:   58.90 ml  30.87 ml/m LA Vol (A4C):   70.3 ml 36.84 ml/m LA Biplane Vol: 62.9 ml 32.97 ml/m  AORTIC VALVE AV Area (Vmax):    1.72 cm AV Area (Vmean):   2.00 cm AV Area (VTI):     2.11 cm AV Vmax:           190.00 cm/s AV Vmean:          117.500 cm/s AV VTI:            0.349 m AV Peak Grad:      14.4 mmHg AV Mean Grad:      7.0 mmHg LVOT Vmax:         115.00 cm/s LVOT Vmean:        82.900 cm/s LVOT VTI:          0.259 m LVOT/AV VTI ratio: 0.74  AORTA Ao Root diam: 3.10 cm MITRAL VALVE               TRICUSPID VALVE MV Area (PHT): 2.66 cm    TR Peak grad:   21.2 mmHg MV Decel Time: 285 msec    TR Vmax:        230.00 cm/s MV E velocity: 59.10 cm/s MV A velocity: 75.20 cm/s  SHUNTS MV E/A ratio:  0.79        Systemic VTI:  0.26 m  Systemic Diam: 1.90 cm Riley Lam MD Electronically signed by Riley Lam MD Signature Date/Time: 11/06/2021/3:59:57 PM    Final     [place] ASCITES (ABDOMEN LIMITED)   Result Date: 11/06/2021 CLINICAL DATA:  Assess ascites. EXAM:  LIMITED ABDOMEN ULTRASOUND FOR ASCITES TECHNIQUE: Limited ultrasound survey for ascites was performed in all four abdominal quadrants. COMPARISON:  None Available. FINDINGS: A small amount of ascites is identified throughout the abdomen. No drainable collection of ascites is noted. IMPRESSION: Small amount of ascites. The amount of ascites is not amenable to paracentesis. Electronically Signed   By: Gerome Sam III M.D.   On: 11/06/2021 10:13    CT Head Wo Contrast   Result Date: 11/05/2021 CLINICAL DATA:  Cirrhosis, altered level of consciousness, confusion EXAM: CT HEAD WITHOUT CONTRAST TECHNIQUE: Contiguous axial images were obtained from the base of the skull through the vertex without intravenous contrast. RADIATION DOSE REDUCTION: This exam was performed according to the departmental dose-optimization program which includes automated exposure control, adjustment of the mA and/or kV according to patient size and/or use of iterative reconstruction technique. COMPARISON:  06/30/2021 FINDINGS: Brain: No acute infarct or hemorrhage. Lateral ventricles and midline structures are unremarkable. No acute extra-axial fluid collections. No mass effect. Vascular: No hyperdense vessel or unexpected calcification. Skull: Normal. Negative for fracture or focal lesion. Sinuses/Orbits: No acute finding. Other: None. IMPRESSION: 1. No acute intracranial process. Electronically Signed   By: Sharlet Salina M.D.   On: 11/05/2021 18:08    CT ABDOMEN PELVIS W CONTRAST   Result Date: 11/05/2021 CLINICAL DATA:  Acute abdominal pain. History of cirrhosis and ascites. EXAM: CT ABDOMEN AND PELVIS WITH CONTRAST TECHNIQUE: Multidetector CT imaging of the abdomen and pelvis was performed using the standard protocol following bolus administration of intravenous contrast. RADIATION DOSE REDUCTION: This exam was performed according to the departmental dose-optimization program which includes automated exposure control, adjustment  of the mA and/or kV according to patient size and/or use of iterative reconstruction technique. CONTRAST:  75mL OMNIPAQUE IOHEXOL 350 MG/ML SOLN COMPARISON:  CT abdomen and pelvis 09/01/2021 FINDINGS: Lower chest: No acute abnormality. Hepatobiliary: Gallstones are present. Mild nodular liver contour persists. No focal liver lesions are seen. Gallstones are present. There is no biliary ductal dilatation. Pancreas: Unremarkable. No pancreatic ductal dilatation or surrounding inflammatory changes. Spleen: Borderline enlarged, unchanged. Adrenals/Urinary Tract: Adrenal glands are unremarkable. Kidneys are normal, without renal calculi, focal lesion, or hydronephrosis. Bladder is unremarkable. Stomach/Bowel: There is diffuse gaseous distention of the colon without dilatation. There is no focal wall thickening or inflammation. The appendix, small bowel and stomach are within normal limits. Vascular/Lymphatic: No significant vascular findings are present. No enlarged abdominal or pelvic lymph nodes. Reproductive: Prostate is unremarkable. Other: No abdominal wall hernia or abnormality. No abdominopelvic ascites. Musculoskeletal: No acute or significant osseous findings. IMPRESSION: 1. No acute localizing process in the abdomen or pelvis. 2. Stable findings of cirrhosis and portal hypertension. 3. Cholelithiasis. Electronically Signed   By: Darliss Cheney M.D.   On: 11/05/2021 18:08    DG Chest 2 View   Result Date: 11/05/2021 CLINICAL DATA:  Altered mental status EXAM: CHEST - 2 VIEW COMPARISON:  07/19/2021 FINDINGS: Transverse diameter of heart is increased. There are no signs of pulmonary edema or focal pulmonary consolidation. There is no pleural effusion or pneumothorax. IMPRESSION: There are no focal infiltrates or signs of pulmonary edema. Electronically Signed   By: Ernie Avena M.D.   On: 11/05/2021 13:27     The ASCVD  Risk score (Arnett DK, et al., 2019) failed to calculate for the following  reasons:   Cannot find a previous HDL lab   Cannot find a previous total cholesterol lab    Assessment & Plan:   Problem List Items Addressed This Visit       Digestive   Alcoholic cirrhosis of liver with ascites (HCC)    Ascites increased Need to rule out bacterial peritonitis spontaneous  We will obtain labs today and send him for ultrasound-guided paracentesis      Relevant Medications   folic acid (FOLVITE) 1 MG tablet   thiamine (VITAMIN B1) 100 MG tablet   Other Relevant Orders   US Paracentesis   Comprehensive metabolic panel   CBC with Differential/Platelet   Protime-INR   Lipase   Cirrhosis of liver (HCC)   Relevant Medications   folic acid (FOLVITE) 1 MG tablet   Other Relevant Orders   US Paracentesis   Comprehensive metabolic panel   CBC with Differential/Platelet   Protime-INR   Lipase   Decompensated hepatic cirrhosis (HCC)    Due to continued alcohol use  Unclear if patient is compliant with medications Due to the fact patient cannot afford some of his medicines this may be an issue why he is not able to adhere to medication program  [facility]ill all medications to the [facility] as the patient is uninsured and cannot afford his medicines         Other   SBP (spontaneous bacterial peritonitis) (HCC) s/t Decompensated Hepatic Cirrhosis - Primary    Reevaluate for same      Relevant Orders   US Paracentesis   Comprehensive metabolic panel   Transaminitis    Reassess liver function     Return in about 2 weeks (around 06/08/2022) for alcohol cirrhosis, chronic conditions.  Extra time needed for Spanish language interpretation and assessment of high risk conditions complex high Shan Levans, MD

## 2022-05-25 NOTE — Patient Instructions (Addendum)
All medications refilled and sent to the [facility] community care pharmacy where pay him assistance will assist you for free medication go there today they were told to fill the medicines today and take all medications as prescribed  Labs will be obtained today  In order to have fluid removed over at [facility] will be made we will call you with the time  Return to [name] 2 weeks  [facility] se resurten y se envan a la farmacia [facility] [place], donde le pagarn asistencia y lo ayudarn a obtener medicamentos gratuitos. Vaya all hoy. Les dijeron que llenaran los medicamentos hoy y tomaran todos los medicamentos segn lo prescrito.  Los laboratorios se obtendrn hoy.  Para que le extraigan el lquido en [facility], le llamaremos para indicarle la hora.  Regreso al [name] 2 semanas

## 2022-05-26 ENCOUNTER — Ambulatory Visit (HOSPITAL_COMMUNITY)
Admission: RE | Admit: 2022-05-26 | Discharge: 2022-05-26 | Disposition: A | Discharge status: Home / Self Care | Payer: Self-pay | Source: Ambulatory Visit | Attending: Critical Care Medicine | Admitting: Critical Care Medicine

## 2022-05-26 DIAGNOSIS — K7031 Alcoholic cirrhosis of liver with ascites: Secondary | ICD-10-CM | POA: Insufficient documentation

## 2022-05-26 DIAGNOSIS — K652 Spontaneous bacterial peritonitis: Secondary | ICD-10-CM | POA: Insufficient documentation

## 2022-05-26 LAB — BODY FLUID CELL COUNT WITH DIFFERENTIAL
Eos, Fluid: 0 %
Lymphs, Fluid: 33 %
Monocyte-Macrophage-Serous Fluid: 66 % (ref 50–90)
Neutrophil Count, Fluid: 1 % (ref 0–25)
Total Nucleated Cell Count, Fluid: 72 cu mm (ref 0–1000)

## 2022-05-26 MED ORDER — LIDOCAINE HCL 1 % IJ SOLN
INTRAMUSCULAR | Status: AC
  Filled 2022-05-26: qty 20

## 2022-05-26 NOTE — Assessment & Plan Note (Addendum)
Due to continued alcohol use  Unclear if patient is compliant with medications Due to the fact patient cannot afford some of his medicines this may be an issue why he is not able to adhere to medication program  Refill all medications to the [employer] [facility] fund as the patient is uninsured and cannot afford his medicines

## 2022-05-26 NOTE — Procedures (Signed)
PROCEDURE SUMMARY:  Successful ultrasound guided paracentesis from the left lower quadrant.  Yielded 4.8 liters of yellow fluid.  No immediate complications.  The patient tolerated the procedure well.   Specimen was sent for labs.  EBL none  A screening evaluation by [facility] portal hypertension clinic will be arranged due to frequent paracenteses   Artemio Aly

## 2022-05-26 NOTE — Assessment & Plan Note (Signed)
Reassess liver function 

## 2022-05-26 NOTE — Assessment & Plan Note (Signed)
Ascites increased Need to rule out bacterial peritonitis spontaneous  We will obtain labs today and send him for ultrasound-guided paracentesis

## 2022-05-26 NOTE — Assessment & Plan Note (Signed)
Reevaluate for same

## 2022-05-27 LAB — PATHOLOGIST SMEAR REVIEW

## 2022-05-30 ENCOUNTER — Encounter (HOSPITAL_COMMUNITY): Payer: Self-pay | Admitting: Emergency Medicine

## 2022-05-30 ENCOUNTER — Other Ambulatory Visit: Payer: Self-pay

## 2022-05-30 ENCOUNTER — Inpatient Hospital Stay (HOSPITAL_COMMUNITY)
Admission: EM | Admit: 2022-05-30 | Discharge: 2022-06-06 | DRG: 871 | Disposition: A | Discharge status: Home / Self Care | Payer: Medicaid Other | Attending: Family Medicine | Admitting: Family Medicine

## 2022-05-30 ENCOUNTER — Other Ambulatory Visit (HOSPITAL_COMMUNITY): Payer: Self-pay

## 2022-05-30 DIAGNOSIS — A419 Sepsis, unspecified organism: Principal | ICD-10-CM | POA: Diagnosis present

## 2022-05-30 DIAGNOSIS — E669 Obesity, unspecified: Secondary | ICD-10-CM | POA: Diagnosis present

## 2022-05-30 DIAGNOSIS — B9620 Unspecified Escherichia coli [E. coli] as the cause of diseases classified elsewhere: Secondary | ICD-10-CM | POA: Diagnosis present

## 2022-05-30 DIAGNOSIS — Z6834 Body mass index (BMI) 34.0-34.9, adult: Secondary | ICD-10-CM

## 2022-05-30 DIAGNOSIS — Z79899 Other long term (current) drug therapy: Secondary | ICD-10-CM

## 2022-05-30 DIAGNOSIS — Z5900 Homelessness unspecified: Secondary | ICD-10-CM

## 2022-05-30 DIAGNOSIS — Z597 Insufficient social insurance and welfare support: Secondary | ICD-10-CM

## 2022-05-30 DIAGNOSIS — K7040 Alcoholic hepatic failure without coma: Secondary | ICD-10-CM | POA: Diagnosis present

## 2022-05-30 DIAGNOSIS — D509 Iron deficiency anemia, unspecified: Secondary | ICD-10-CM | POA: Diagnosis present

## 2022-05-30 DIAGNOSIS — K55059 Acute (reversible) ischemia of intestine, part and extent unspecified: Secondary | ICD-10-CM | POA: Diagnosis present

## 2022-05-30 DIAGNOSIS — D539 Nutritional anemia, unspecified: Secondary | ICD-10-CM | POA: Diagnosis present

## 2022-05-30 DIAGNOSIS — E43 Unspecified severe protein-calorie malnutrition: Secondary | ICD-10-CM | POA: Diagnosis present

## 2022-05-30 DIAGNOSIS — Z515 Encounter for palliative care: Secondary | ICD-10-CM

## 2022-05-30 DIAGNOSIS — K429 Umbilical hernia without obstruction or gangrene: Secondary | ICD-10-CM | POA: Diagnosis present

## 2022-05-30 DIAGNOSIS — K7011 Alcoholic hepatitis with ascites: Secondary | ICD-10-CM | POA: Diagnosis present

## 2022-05-30 DIAGNOSIS — D689 Coagulation defect, unspecified: Secondary | ICD-10-CM | POA: Diagnosis present

## 2022-05-30 DIAGNOSIS — F1090 Alcohol use, unspecified, uncomplicated: Secondary | ICD-10-CM | POA: Diagnosis present

## 2022-05-30 DIAGNOSIS — E872 Acidosis, unspecified: Secondary | ICD-10-CM | POA: Diagnosis present

## 2022-05-30 DIAGNOSIS — Z1629 Resistance to other single specified antibiotic: Secondary | ICD-10-CM | POA: Diagnosis present

## 2022-05-30 DIAGNOSIS — D61818 Other pancytopenia: Secondary | ICD-10-CM | POA: Diagnosis present

## 2022-05-30 DIAGNOSIS — K7030 Alcoholic cirrhosis of liver without ascites: Secondary | ICD-10-CM | POA: Diagnosis present

## 2022-05-30 DIAGNOSIS — K859 Acute pancreatitis without necrosis or infection, unspecified: Secondary | ICD-10-CM | POA: Diagnosis present

## 2022-05-30 DIAGNOSIS — K219 Gastro-esophageal reflux disease without esophagitis: Secondary | ICD-10-CM | POA: Diagnosis present

## 2022-05-30 DIAGNOSIS — K652 Spontaneous bacterial peritonitis: Principal | ICD-10-CM | POA: Diagnosis present

## 2022-05-30 DIAGNOSIS — R7989 Other specified abnormal findings of blood chemistry: Secondary | ICD-10-CM | POA: Diagnosis present

## 2022-05-30 DIAGNOSIS — K7010 Alcoholic hepatitis without ascites: Secondary | ICD-10-CM | POA: Diagnosis present

## 2022-05-30 DIAGNOSIS — E8770 Fluid overload, unspecified: Secondary | ICD-10-CM | POA: Diagnosis present

## 2022-05-30 DIAGNOSIS — Y908 Blood alcohol level of 240 mg/100 ml or more: Secondary | ICD-10-CM | POA: Diagnosis present

## 2022-05-30 DIAGNOSIS — E876 Hypokalemia: Secondary | ICD-10-CM | POA: Diagnosis present

## 2022-05-30 DIAGNOSIS — R1084 Generalized abdominal pain: Secondary | ICD-10-CM

## 2022-05-30 DIAGNOSIS — F1020 Alcohol dependence, uncomplicated: Secondary | ICD-10-CM | POA: Diagnosis present

## 2022-05-30 DIAGNOSIS — E871 Hypo-osmolality and hyponatremia: Secondary | ICD-10-CM | POA: Diagnosis present

## 2022-05-30 DIAGNOSIS — F17210 Nicotine dependence, cigarettes, uncomplicated: Secondary | ICD-10-CM | POA: Diagnosis present

## 2022-05-30 DIAGNOSIS — K7031 Alcoholic cirrhosis of liver with ascites: Secondary | ICD-10-CM | POA: Diagnosis present

## 2022-05-30 DIAGNOSIS — Z66 Do not resuscitate: Secondary | ICD-10-CM | POA: Diagnosis present

## 2022-05-30 LAB — BODY FLUID CULTURE W GRAM STAIN: Culture: NO GROWTH

## 2022-05-30 NOTE — ED Provider Triage Note (Cosign Needed Addendum)
Emergency Medicine Provider Triage Evaluation Note  Mathew [name] , a 42 y.o. male  was evaluated in triage.  Pt complains of abdominal pain, hx of cirrhosis, SBP, unable to recall when last paracentesis was, denies fevers, reports mild SHOB.  Record review, last para with [name] 05/26/22, 4.8L Review of Systems  Positive: As above Negative: As above  Physical Exam  BP 135/79 (BP Location: Left Arm)   Pulse (!) 113   Temp 98.4 F (36.9 C) (Oral)   Resp (!) 24   Wt 83 kg   SpO2 100%   BMI 34.57 kg/m  Gen:   Awake, no distress   Resp:  Normal effort  MSK:   Moves extremities without difficulty  Other:  Abdomen distended, diffuse tenderness, umbilical hernia is soft, easily reduced with gentle pressure  Medical Decision Making  Medically screening exam initiated at 11:36 PM.  Appropriate orders placed.  Mathew [name] was informed that the remainder of the evaluation will be completed by another provider, this initial triage assessment does not replace that evaluation, and the importance of remaining in the ED until their evaluation is complete.     Jeannie Fend, PA-C 05/30/22 [id number]    Jeannie Fend, PA-C 05/30/22 [phone]

## 2022-05-30 NOTE — ED Triage Notes (Signed)
EMS from home c/o hernia X4-5 years abdominal is distended and tender.    162/90 HR 94 98% 150 CBG

## 2022-05-31 ENCOUNTER — Emergency Department (HOSPITAL_COMMUNITY): Payer: Medicaid Other

## 2022-05-31 DIAGNOSIS — Z79899 Other long term (current) drug therapy: Secondary | ICD-10-CM | POA: Diagnosis not present

## 2022-05-31 DIAGNOSIS — F17210 Nicotine dependence, cigarettes, uncomplicated: Secondary | ICD-10-CM | POA: Diagnosis present

## 2022-05-31 DIAGNOSIS — F1020 Alcohol dependence, uncomplicated: Secondary | ICD-10-CM | POA: Diagnosis present

## 2022-05-31 DIAGNOSIS — E669 Obesity, unspecified: Secondary | ICD-10-CM

## 2022-05-31 DIAGNOSIS — K652 Spontaneous bacterial peritonitis: Secondary | ICD-10-CM | POA: Diagnosis present

## 2022-05-31 DIAGNOSIS — K7011 Alcoholic hepatitis with ascites: Secondary | ICD-10-CM

## 2022-05-31 DIAGNOSIS — Z1629 Resistance to other single specified antibiotic: Secondary | ICD-10-CM | POA: Diagnosis present

## 2022-05-31 DIAGNOSIS — K859 Acute pancreatitis without necrosis or infection, unspecified: Secondary | ICD-10-CM | POA: Diagnosis present

## 2022-05-31 DIAGNOSIS — D509 Iron deficiency anemia, unspecified: Secondary | ICD-10-CM | POA: Diagnosis present

## 2022-05-31 DIAGNOSIS — K55059 Acute (reversible) ischemia of intestine, part and extent unspecified: Secondary | ICD-10-CM | POA: Diagnosis present

## 2022-05-31 DIAGNOSIS — D61818 Other pancytopenia: Secondary | ICD-10-CM | POA: Diagnosis present

## 2022-05-31 DIAGNOSIS — Z515 Encounter for palliative care: Secondary | ICD-10-CM | POA: Diagnosis not present

## 2022-05-31 DIAGNOSIS — Z5900 Homelessness unspecified: Secondary | ICD-10-CM | POA: Diagnosis not present

## 2022-05-31 DIAGNOSIS — E871 Hypo-osmolality and hyponatremia: Secondary | ICD-10-CM

## 2022-05-31 DIAGNOSIS — K7040 Alcoholic hepatic failure without coma: Secondary | ICD-10-CM | POA: Diagnosis present

## 2022-05-31 DIAGNOSIS — E8770 Fluid overload, unspecified: Secondary | ICD-10-CM | POA: Diagnosis present

## 2022-05-31 DIAGNOSIS — K7010 Alcoholic hepatitis without ascites: Secondary | ICD-10-CM | POA: Diagnosis present

## 2022-05-31 DIAGNOSIS — B9620 Unspecified Escherichia coli [E. coli] as the cause of diseases classified elsewhere: Secondary | ICD-10-CM | POA: Diagnosis present

## 2022-05-31 DIAGNOSIS — A419 Sepsis, unspecified organism: Secondary | ICD-10-CM | POA: Diagnosis present

## 2022-05-31 DIAGNOSIS — E876 Hypokalemia: Secondary | ICD-10-CM | POA: Diagnosis present

## 2022-05-31 DIAGNOSIS — Y908 Blood alcohol level of 240 mg/100 ml or more: Secondary | ICD-10-CM | POA: Diagnosis present

## 2022-05-31 DIAGNOSIS — Z66 Do not resuscitate: Secondary | ICD-10-CM | POA: Diagnosis present

## 2022-05-31 DIAGNOSIS — K219 Gastro-esophageal reflux disease without esophagitis: Secondary | ICD-10-CM

## 2022-05-31 DIAGNOSIS — E43 Unspecified severe protein-calorie malnutrition: Secondary | ICD-10-CM | POA: Diagnosis present

## 2022-05-31 DIAGNOSIS — R6520 Severe sepsis without septic shock: Secondary | ICD-10-CM

## 2022-05-31 DIAGNOSIS — F1090 Alcohol use, unspecified, uncomplicated: Secondary | ICD-10-CM

## 2022-05-31 DIAGNOSIS — E872 Acidosis, unspecified: Secondary | ICD-10-CM | POA: Diagnosis present

## 2022-05-31 DIAGNOSIS — D689 Coagulation defect, unspecified: Secondary | ICD-10-CM | POA: Diagnosis present

## 2022-05-31 DIAGNOSIS — R7989 Other specified abnormal findings of blood chemistry: Secondary | ICD-10-CM

## 2022-05-31 DIAGNOSIS — K7031 Alcoholic cirrhosis of liver with ascites: Secondary | ICD-10-CM

## 2022-05-31 DIAGNOSIS — K429 Umbilical hernia without obstruction or gangrene: Secondary | ICD-10-CM

## 2022-05-31 HISTORY — PX: IR PARACENTESIS: IMG2679

## 2022-05-31 LAB — COMPREHENSIVE METABOLIC PANEL
ALT: 48 U/L — ABNORMAL HIGH (ref 0–44)
AST: 116 U/L — ABNORMAL HIGH (ref 15–41)
Albumin: 2.4 g/dL — ABNORMAL LOW (ref 3.5–5.0)
Alkaline Phosphatase: 154 U/L — ABNORMAL HIGH (ref 38–126)
Anion gap: 9 (ref 5–15)
BUN: 5 mg/dL — ABNORMAL LOW (ref 6–20)
CO2: 18 mmol/L — ABNORMAL LOW (ref 22–32)
Calcium: 7.9 mg/dL — ABNORMAL LOW (ref 8.9–10.3)
Chloride: 99 mmol/L (ref 98–111)
Creatinine, Ser: 0.73 mg/dL (ref 0.61–1.24)
GFR, Estimated: >60 mL/min (ref >60)
Glucose, Bld: 142 mg/dL — ABNORMAL HIGH (ref 70–99)
Potassium: 4 mmol/L (ref 3.5–5.1)
Sodium: 126 mmol/L — ABNORMAL LOW (ref 135–145)
Total Bilirubin: 5.3 mg/dL — ABNORMAL HIGH (ref 0.3–1.2)
Total Protein: 6.6 g/dL (ref 6.5–8.1)

## 2022-05-31 LAB — URINALYSIS, ROUTINE W REFLEX MICROSCOPIC
Glucose, UA: NEGATIVE mg/dL
Ketones, ur: NEGATIVE mg/dL
Leukocytes,Ua: NEGATIVE
Nitrite: NEGATIVE
Protein, ur: 30 mg/dL — AB
Specific Gravity, Urine: 1.013 (ref 1.005–1.030)
pH: 5 (ref 5.0–8.0)

## 2022-05-31 LAB — CBC WITH DIFFERENTIAL/PLATELET
Abs Immature Granulocytes: 0.01 10*3/uL (ref 0.00–0.07)
Basophils Absolute: 0 10*3/uL (ref 0.0–0.1)
Basophils Relative: 1 %
Eosinophils Absolute: 0 10*3/uL (ref 0.0–0.5)
Eosinophils Relative: 2 %
HCT: 36 % — ABNORMAL LOW (ref 39.0–52.0)
Hemoglobin: 12.4 g/dL — ABNORMAL LOW (ref 13.0–17.0)
Immature Granulocytes: 1 %
Lymphocytes Relative: 26 %
Lymphs Abs: 0.5 10*3/uL — ABNORMAL LOW (ref 0.7–4.0)
MCH: 35.6 pg — ABNORMAL HIGH (ref 26.0–34.0)
MCHC: 34.4 g/dL (ref 30.0–36.0)
MCV: 103.4 fL — ABNORMAL HIGH (ref 80.0–100.0)
Monocytes Absolute: 0.1 10*3/uL (ref 0.1–1.0)
Monocytes Relative: 4 %
Neutro Abs: 1.4 10*3/uL — ABNORMAL LOW (ref 1.7–7.7)
Neutrophils Relative %: 66 %
Platelets: 63 10*3/uL — ABNORMAL LOW (ref 150–400)
RBC: 3.48 MIL/uL — ABNORMAL LOW (ref 4.22–5.81)
RDW: 12.5 % (ref 11.5–15.5)
WBC: 2.1 10*3/uL — ABNORMAL LOW (ref 4.0–10.5)
nRBC: 0 % (ref 0.0–0.2)

## 2022-05-31 LAB — PROTEIN, PLEURAL OR PERITONEAL FLUID: Total protein, fluid: <3 g/dL

## 2022-05-31 LAB — ETHANOL: Alcohol, Ethyl (B): 243 mg/dL — ABNORMAL HIGH (ref <10)

## 2022-05-31 LAB — LACTATE DEHYDROGENASE, PLEURAL OR PERITONEAL FLUID: LD, Fluid: 39 U/L — ABNORMAL HIGH (ref 3–23)

## 2022-05-31 LAB — LACTIC ACID, PLASMA
Lactic Acid, Venous: 2 mmol/L (ref 0.5–1.9)
Lactic Acid, Venous: 2 mmol/L (ref 0.5–1.9)

## 2022-05-31 LAB — PROTIME-INR
INR: 1.8 — ABNORMAL HIGH (ref 0.8–1.2)
Prothrombin Time: 20.8 seconds — ABNORMAL HIGH (ref 11.4–15.2)

## 2022-05-31 LAB — LIPASE, BLOOD: Lipase: 39 U/L (ref 11–51)

## 2022-05-31 LAB — CULTURE, BODY FLUID W GRAM STAIN -BOTTLE

## 2022-05-31 LAB — ALBUMIN, PLEURAL OR PERITONEAL FLUID: Albumin, Fluid: <1.5 g/dL

## 2022-05-31 LAB — AMMONIA: Ammonia: 46 umol/L — ABNORMAL HIGH (ref 9–35)

## 2022-05-31 LAB — GLUCOSE, PLEURAL OR PERITONEAL FLUID: Glucose, Fluid: 107 mg/dL

## 2022-05-31 MED ORDER — THIAMINE MONONITRATE 100 MG PO TABS
100.0000 mg | ORAL_TABLET | Freq: Every day | ORAL | Status: DC
  Administered 2022-05-31 – 2022-06-02 (×3): 100 mg via ORAL
  Filled 2022-05-31 (×3): qty 1

## 2022-05-31 MED ORDER — KETOROLAC TROMETHAMINE 15 MG/ML IJ SOLN
15.0000 mg | Freq: Once | INTRAMUSCULAR | Status: AC
  Administered 2022-05-31: 15 mg via INTRAVENOUS
  Filled 2022-05-31: qty 1

## 2022-05-31 MED ORDER — ONDANSETRON HCL 4 MG/2ML IJ SOLN
4.0000 mg | Freq: Four times a day (QID) | INTRAMUSCULAR | Status: DC | PRN
  Administered 2022-06-01: 4 mg via INTRAVENOUS
  Filled 2022-05-31: qty 2

## 2022-05-31 MED ORDER — FOLIC ACID 1 MG PO TABS
1.0000 mg | ORAL_TABLET | Freq: Every day | ORAL | Status: DC
  Administered 2022-05-31 – 2022-06-02 (×3): 1 mg via ORAL
  Filled 2022-05-31 (×3): qty 1

## 2022-05-31 MED ORDER — LIDOCAINE HCL 1 % IJ SOLN
INTRAMUSCULAR | Status: AC
  Filled 2022-05-31: qty 20

## 2022-05-31 MED ORDER — LORAZEPAM 2 MG/ML IJ SOLN: 1.0000 mg | INTRAMUSCULAR | Status: DC | PRN

## 2022-05-31 MED ORDER — ADULT MULTIVITAMIN W/MINERALS CH
1.0000 | ORAL_TABLET | Freq: Every day | ORAL | Status: DC
  Administered 2022-05-31 – 2022-06-02 (×3): 1 via ORAL
  Filled 2022-05-31 (×3): qty 1

## 2022-05-31 MED ORDER — LORAZEPAM 1 MG PO TABS: 1.0000 mg | ORAL_TABLET | ORAL | Status: DC | PRN

## 2022-05-31 MED ORDER — LORAZEPAM 2 MG/ML IJ SOLN: 0.0000 mg | Freq: Four times a day (QID) | INTRAMUSCULAR | Status: DC

## 2022-05-31 MED ORDER — SODIUM CHLORIDE 0.9 % IV SOLN
2.0000 g | Freq: Once | INTRAVENOUS | Status: AC
  Administered 2022-05-31: 2 g via INTRAVENOUS
  Filled 2022-05-31: qty 20

## 2022-05-31 MED ORDER — ALBUMIN HUMAN 25 % IV SOLN
12.5000 g | Freq: Once | INTRAVENOUS | Status: AC
  Administered 2022-05-31: 12.5 g via INTRAVENOUS
  Filled 2022-05-31: qty 50

## 2022-05-31 MED ORDER — CHLORDIAZEPOXIDE HCL 25 MG PO CAPS: ORAL_CAPSULE | ORAL | 0 refills | Status: DC

## 2022-05-31 MED ORDER — LIDOCAINE HCL 2 % IJ SOLN
INTRAMUSCULAR | Status: AC
  Filled 2022-05-31: qty 20

## 2022-05-31 MED ORDER — OXYCODONE HCL 5 MG PO TABS
5.0000 mg | ORAL_TABLET | Freq: Four times a day (QID) | ORAL | Status: DC | PRN
  Administered 2022-05-31 – 2022-06-02 (×3): 5 mg via ORAL
  Filled 2022-05-31 (×3): qty 1

## 2022-05-31 MED ORDER — SODIUM CHLORIDE 0.9 % IV SOLN
2.0000 g | INTRAVENOUS | Status: DC
  Administered 2022-06-01 – 2022-06-02 (×2): 2 g via INTRAVENOUS
  Filled 2022-05-31 (×2): qty 20

## 2022-05-31 MED ORDER — PANTOPRAZOLE SODIUM 40 MG PO TBEC
40.0000 mg | DELAYED_RELEASE_TABLET | Freq: Every day | ORAL | Status: DC
  Administered 2022-06-01 – 2022-06-06 (×6): 40 mg via ORAL
  Filled 2022-05-31 (×6): qty 1

## 2022-05-31 MED ORDER — SODIUM CHLORIDE 0.9 % IV BOLUS
1000.0000 mL | Freq: Once | INTRAVENOUS | Status: AC
  Administered 2022-05-31: 1000 mL via INTRAVENOUS

## 2022-05-31 MED ORDER — SODIUM CHLORIDE 0.9% FLUSH
3.0000 mL | Freq: Two times a day (BID) | INTRAVENOUS | Status: DC
  Administered 2022-05-31 – 2022-06-06 (×13): 3 mL via INTRAVENOUS

## 2022-05-31 MED ORDER — LACTULOSE 10 GM/15ML PO SOLN
30.0000 g | Freq: Four times a day (QID) | ORAL | Status: DC
  Administered 2022-05-31 – 2022-06-06 (×24): 30 g via ORAL
  Filled 2022-05-31 (×25): qty 45

## 2022-05-31 MED ORDER — HYDROMORPHONE HCL 1 MG/ML IJ SOLN
0.5000 mg | INTRAMUSCULAR | Status: DC | PRN
  Administered 2022-05-31: 0.5 mg via INTRAVENOUS
  Filled 2022-05-31: qty 0.5

## 2022-05-31 MED ORDER — CHLORDIAZEPOXIDE HCL 25 MG PO CAPS
25.0000 mg | ORAL_CAPSULE | Freq: Once | ORAL | Status: AC
  Administered 2022-05-31: 25 mg via ORAL
  Filled 2022-05-31: qty 1

## 2022-05-31 MED ORDER — ALBUTEROL SULFATE (2.5 MG/3ML) 0.083% IN NEBU: 2.5000 mg | INHALATION_SOLUTION | Freq: Four times a day (QID) | RESPIRATORY_TRACT | Status: DC | PRN

## 2022-05-31 MED ORDER — ONDANSETRON HCL 4 MG PO TABS: 4.0000 mg | ORAL_TABLET | Freq: Four times a day (QID) | ORAL | Status: DC | PRN

## 2022-05-31 MED ORDER — THIAMINE HCL 100 MG/ML IJ SOLN: 100.0000 mg | Freq: Every day | INTRAMUSCULAR | Status: DC

## 2022-05-31 MED ORDER — PENTOXIFYLLINE ER 400 MG PO TBCR
400.0000 mg | EXTENDED_RELEASE_TABLET | Freq: Three times a day (TID) | ORAL | Status: DC
  Filled 2022-05-31 (×2): qty 1

## 2022-05-31 MED ORDER — LORAZEPAM 2 MG/ML IJ SOLN: 0.0000 mg | Freq: Two times a day (BID) | INTRAMUSCULAR | Status: DC

## 2022-05-31 MED ORDER — NALOXONE HCL 0.4 MG/ML IJ SOLN: 0.4000 mg | INTRAMUSCULAR | Status: DC | PRN

## 2022-05-31 MED ORDER — ACETAMINOPHEN 325 MG PO TABS
650.0000 mg | ORAL_TABLET | Freq: Three times a day (TID) | ORAL | Status: DC | PRN
  Administered 2022-05-31 – 2022-06-02 (×3): 650 mg via ORAL
  Filled 2022-05-31 (×3): qty 2

## 2022-05-31 NOTE — ED Provider Notes (Signed)
[place] EMERGENCY DEPARTMENT AT [facility] Provider Note  CSN: [id number] Arrival date & time: 05/30/22 2308  Chief Complaint(s) Hernia  HPI Mathew [name] is a 42 y.o. 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  Patient has had prior SBP.  He presents to the emergency department with abdominal pain.  Generalized.  Worse with movement and palpation.  Patient states that he still drinking and states that he only drank two 12 ounce cans of beer yesterday.  He denies any nausea or vomiting.  States that he had a paracentesis sometime last week.  No fevers or chills.  No diarrhea.  No chest pain.  No shortness of breath.  He does have peripheral edema.  HPI  Past Medical History Past Medical History:  Diagnosis Date   Acute metabolic encephalopathy 06/20/2021   Alcohol withdrawal seizure (HCC)    Cirrhosis (HCC)    ETOH abuse    Pneumonia 04/02/2020   SBP (spontaneous bacterial peritonitis) (HCC) 06/17/2021   Patient Active Problem List   Diagnosis Date Noted   Decompensated hepatic cirrhosis (HCC) 03/01/2022   Anemia 02/19/2022   Transaminitis 02/19/2022   Uninsured 11/16/2021   Thrombocytopenia (HCC) 11/15/2021   Alcohol use disorder 09/02/2021   Hyponatremia 09/02/2021   Cirrhosis of liver (HCC) 09/01/2021   Pancytopenia (HCC)    Tobacco abuse    SBP (spontaneous bacterial peritonitis) (HCC) s/t Decompensated Hepatic Cirrhosis 06/17/2021   Umbilical hernia 11/26/2020   Vitamin D deficiency 02/03/2020   Edema due to hypoalbuminemia 12/20/2019   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 Authorizing Provider  acetaminophen (TYLENOL) 325 MG tablet Take 2 tablets (650 mg total) by mouth every 6 (six) hours as needed for mild pain (or Fever >/= 101). 11/18/21   Evette Georges,  MD  ferrous sulfate 325 (65 FE) MG tablet Take 1 tablet (325 mg total) by mouth daily with breakfast. 05/25/22   [name], MD  folic acid (FOLVITE) 1 MG tablet Take 1 tablet (1 mg total) by mouth daily. 05/25/22   [name], MD  furosemide (LASIX) 80 MG tablet Take 1 tablet (80 mg total) by mouth daily. 05/25/22   [name], MD  lactulose (CHRONULAC) 10 GM/15ML solution Take 45 mLs (30 g total) by mouth every 6 (six) hours. 05/25/22   [name], MD  Multiple Vitamin (MULTIVITAMIN WITH MINERALS) TABS tablet Take 1 tablet by mouth daily. 02/20/22   Levin Erp, MD  pantoprazole (PROTONIX) 40 MG tablet Take 1 tablet (40 mg total) by mouth daily at 6 (six) AM. 05/25/22   [name], MD  propranolol (INDERAL) 10 MG tablet Take 1 tablet (10 mg total) by mouth 2 (two) times daily. 05/25/22   [name], MD  spironolactone (ALDACTONE) 100 MG tablet Take 2 tablets (200 mg total) by mouth daily. 05/25/22   [name], MD  thiamine (VITAMIN B1) 100 MG tablet Take 1 tablet (100 mg total) by mouth daily. 05/25/22   [name], MD  Allergies Patient has no known allergies.  Review of Systems Review of Systems As noted in HPI  Physical Exam Vital Signs  I have reviewed the triage vital signs BP 118/72   Pulse (!) 108   Temp 99.9 F (37.7 C) (Axillary)   Resp (!) 21   Wt 83 kg   SpO2 100%   BMI 34.57 kg/m   Physical Exam Vitals reviewed.  Constitutional:      General: He is not in acute distress.    Appearance: He is well-developed. He is not diaphoretic.  HENT:     Head: Normocephalic and atraumatic.     Nose: Nose normal.  Eyes:     General: No scleral icterus.       Right eye: No discharge.        Left eye: No discharge.     Conjunctiva/sclera: Conjunctivae normal.     Pupils: Pupils are equal, round,  and reactive to light.  Cardiovascular:     Rate and Rhythm: Regular rhythm. Tachycardia present.     Heart sounds: No murmur heard.    No friction rub. No gallop.  Pulmonary:     Effort: Pulmonary effort is normal. No respiratory distress.     Breath sounds: Normal breath sounds. No stridor. No rales.  Abdominal:     General: There is distension.     Palpations: Abdomen is soft. There is fluid wave.     Tenderness: There is generalized abdominal tenderness.     Hernia: A hernia is present. Hernia is present in the umbilical area (reducible).  Musculoskeletal:        General: No tenderness.     Cervical back: Normal range of motion and neck supple.     Right lower leg: 1+ Pitting Edema present.     Left lower leg: 1+ Pitting Edema present.  Skin:    General: Skin is warm and dry.     Findings: No erythema or rash.  Neurological:     Mental Status: He is alert and oriented to person, place, and time.     ED Results and Treatments Labs (all labs ordered are listed, but only abnormal results are displayed) Labs Reviewed  CBC WITH DIFFERENTIAL/PLATELET - Abnormal; Notable for the following components:      Result Value   WBC 2.1 (*)    RBC 3.48 (*)    Hemoglobin 12.4 (*)    HCT 36.0 (*)    MCV 103.4 (*)    MCH 35.6 (*)    Platelets 63 (*)    Neutro Abs 1.4 (*)    Lymphs Abs 0.5 (*)    All other components within normal limits  COMPREHENSIVE METABOLIC PANEL - Abnormal; Notable for the following components:   Sodium 126 (*)    CO2 18 (*)    Glucose, Bld 142 (*)    BUN 5 (*)    Calcium 7.9 (*)    Albumin 2.4 (*)    AST 116 (*)    ALT 48 (*)    Alkaline Phosphatase 154 (*)    Total Bilirubin 5.3 (*)    All other components within normal limits  URINALYSIS, ROUTINE W REFLEX MICROSCOPIC - Abnormal; Notable for the following components:   Color, Urine AMBER (*)    APPearance HAZY (*)    Hgb urine dipstick LARGE (*)    Bilirubin Urine SMALL (*)    Protein, ur 30 (*)     Bacteria, UA FEW (*)    All  other components within normal limits  PROTIME-INR - Abnormal; Notable for the following components:   Prothrombin Time 20.8 (*)    INR 1.8 (*)    All other components within normal limits  LACTIC ACID, PLASMA - Abnormal; Notable for the following components:   Lactic Acid, Venous 2.0 (*)    All other components within normal limits  LACTIC ACID, PLASMA - Abnormal; Notable for the following components:   Lactic Acid, Venous 2.0 (*)    All other components within normal limits  AMMONIA - Abnormal; Notable for the following components:   Ammonia 46 (*)    All other components within normal limits  ETHANOL - Abnormal; Notable for the following components:   Alcohol, Ethyl (B) 243 (*)    All other components within normal limits  LACTATE DEHYDROGENASE, PLEURAL OR PERITONEAL FLUID - Abnormal; Notable for the following components:   LD, Fluid 39 (*)    All other components within normal limits  CULTURE, BODY FLUID W GRAM STAIN -BOTTLE  CULTURE, BLOOD (ROUTINE X 2)  CULTURE, BLOOD (ROUTINE X 2)  LIPASE, BLOOD  GLUCOSE, PLEURAL OR PERITONEAL FLUID  PROTEIN, PLEURAL OR PERITONEAL FLUID  ALBUMIN, PLEURAL OR PERITONEAL FLUID                                                                                                                          EKG  EKG Interpretation  Date/Time:    Ventricular Rate:    PR Interval:    QRS Duration:   QT Interval:    QTC Calculation:   R Axis:     Text Interpretation:         Radiology No results found.  Medications Ordered in ED Medications  albumin human 25 % solution 12.5 g (has no administration in time range)  lidocaine (XYLOCAINE) 2 % (with pres) injection (  Given by Other 05/31/22 0428)  cefTRIAXone (ROCEPHIN) 2 g in sodium chloride 0.9 % 100 mL IVPB (0 g Intravenous Stopped 05/31/22 0635)  sodium chloride 0.9 % bolus 1,000 mL (0 mLs Intravenous Stopped 05/31/22 0634)  ketorolac (TORADOL) 15 MG/ML injection 15  mg (15 mg Intravenous Given 05/31/22 0702)  chlordiazePOXIDE (LIBRIUM) capsule 25 mg (25 mg Oral Given 05/31/22 1610)   Procedures Procedures  (including critical care time) Medical Decision Making / ED Course  Click here for ABCD2, HEART and other calculators  Medical Decision Making Amount and/or Complexity of Data Reviewed Labs: ordered. Decision-making details documented in ED Course.  Risk Prescription drug management. Decision regarding hospitalization.    Generalized abdominal pain.  Differential includes pain related to ascites.  Will need to rule out bacterial peritonitis given recent instrumentations.  Labs from the 15th were negative for bacterial peritonitis and had no growth on the cultures.  Will assess for urinary tract infection.  CBC with stable pancytopenia. CBC with mildly worsening hyponatremia.  No other significant electrolyte derangements.  No renal sufficiency.  Stable transaminitis. UA without evidence of  infection. Lactic acid elevated.  Stable on second.  Likely due to decreased clearance from cirrhosis. INR 1.8. Peritoneal fluid without organisms on Gram stain. Not consistent with peritonitis EtOH of 243 which is consistent with the patient's reported alcohol use.  Patient still having significant abdominal pain. Will require large-volume paracentesis.  Librium given to prevent WD.  Patient care turned over to oncoming provider. Patient case and results discussed in detail; please see their note for further ED managment.        Final Clinical Impression(s) / ED Diagnoses Final diagnoses:  None           This chart was dictated using voice recognition software.  Despite best efforts to proofread,  errors can occur which can change the documentation meaning.    Nira Conn, MD 05/31/22 [phone]

## 2022-05-31 NOTE — ED Provider Notes (Addendum)
Patient was initially seen by Dr. Eudelia Bunch.  Please see his note.  Patient has history of alcohol use disorder, cirrhosis and recurrent ascites.  Patient noted to have recurrent ascites in the ED.  Patient was treated with a large-volume paracentesis.  Patient's labs were notable for hyponatremia persistent abnormalities of his liver function test.  Similar to previous.  Patient is feeling better after his paracentesis.  No signs of withdrawal at this time.  Will make sure patient can tolerate today p.o. challenge.  He does have outpatient follow-up.  We discussed alcohol cessation and potential outpatient treatment centers.   Linwood Dibbles, MD 05/31/22 1208  Clinical Course as of 05/31/22 1510  Tue May 31, 2022  1435 Critical result from microbiology.  Positive for gram negative rods  [JK]  1510 Case discussed with Dr Katrinka Blazing regarding admission [JK]    Clinical Course User Index [JK] Linwood Dibbles, MD    I was just called from laboratory that the patient has gram-negative rods on his paracentesis.  I will start the patient on antibiotics and arrange for admission for SBP.  Patient fortunately still in the ED and has not left yet.  Patient was given ceftriaxone IV earlier this morning       Linwood Dibbles, MD 05/31/22 1447    Linwood Dibbles, MD 05/31/22 1510

## 2022-05-31 NOTE — Progress Notes (Addendum)
PT with complain of cramping, to bilateral feet and hands, +1 edema noted to bilateral LE., Also complain of pain 8/10, to abdomen and back of knee, assessment done VS 99.9, 119/63, 116, resp 20, 98%RA, CWIA score 2. Pt states, ABD pain not going away. IPAD use for translator with pt. Angie Fava MD notified. New order received.

## 2022-05-31 NOTE — H&P (Addendum)
History and Physical    Patient: Mathew [name] DOB: Sep 16, 1980 DOA: 05/30/2022 DOS: the patient was seen and examined on 05/31/2022 PCP: [name], MD  Patient coming from: Home via EMS  Chief Complaint:  Chief Complaint  Patient presents with   Abdominal pain   HPI: Mathew [name] is a 42 y.o. male with medical history significant of decompensated hepatic cirrhosis, h/o SBP, umbilical hernia, alcohol abuse, tobacco abuse who presented with complaints of abdominal pain which started yesterday evening.  Patient reported having severe pain across his abdomen.  Pain was worsened by any kind of movement.  Denies having any recent fever, chills, nausea, vomiting, or diarrhea.  He does admit to drinking 2 beers the other day.  He just had a ultrasound-guided paracentesis on 5/16 after following up with his primary care provider at [facility].  In the emergency department patient was noted to have temperature up to 100 F, pulse elevated up to 113, respirations 15-24, and all other vital signs maintained.  Labs significant for WBC 2.1, hemoglobin 12.4, platelets 63, sodium 126, CO2 18, BUN 5, creatinine 0.73, calcium 7.9, albumin 2.4, AST 116, ALT 48, total bilirubin 5.3, ammonia 46, alcohol level 243, lactic acid 2, INR 1.8.  Patient underwent ultrasound-guided paracentesis with removal of 6 L of clear yellow fluid. Urinalysis noted large hemoglobin, few bacteria, 6-10 RBC/hpf, and 6-10 WBCs.  Plan had initially been for possible discharge home, but peritoneal fluid cultures grew out gram-negative rods.  Patient had already been given Rocephin 2 g IV due to concern for possible peritonitis.   Review of Systems: As mentioned in the history of present illness. All other systems reviewed and are negative. Past Medical History:  Diagnosis Date   Acute metabolic encephalopathy 06/20/2021   Alcohol withdrawal seizure (HCC)    Cirrhosis (HCC)     ETOH abuse    Pneumonia 04/02/2020   SBP (spontaneous bacterial peritonitis) (HCC) 06/17/2021   Past Surgical History:  Procedure Laterality Date   COLONOSCOPY WITH PROPOFOL N/A 03/17/2018   Procedure: COLONOSCOPY WITH PROPOFOL;  Surgeon: Bernette Redbird, MD;  Location: [facility] ENDOSCOPY;  Service: Endoscopy;  Laterality: N/A;   ESOPHAGOGASTRODUODENOSCOPY (EGD) WITH PROPOFOL N/A 03/17/2018   Procedure: ESOPHAGOGASTRODUODENOSCOPY (EGD) WITH PROPOFOL;  Surgeon: Bernette Redbird, MD;  Location: [facility] ENDOSCOPY;  Service: Endoscopy;  Laterality: N/A;   IR PARACENTESIS  03/14/2018   IR PARACENTESIS  11/18/2020   IR PARACENTESIS  06/17/2021   IR PARACENTESIS  08/30/2021   IR PARACENTESIS  09/02/2021   IR PARACENTESIS  11/16/2021   IR PARACENTESIS  03/01/2022   Social History:  reports that he has been smoking cigarettes. He started smoking about 2 years ago. He has been smoking an average of .25 packs per day. He has never used smokeless tobacco. He reports current alcohol use of about 2.0 standard drinks of alcohol per week. He reports that he does not use drugs.  No Known Allergies  Family History  Adopted: Yes  Problem Relation Age of Onset   Cancer Neg Hx    Heart disease Neg Hx     Prior to Admission medications   Medication Sig Start Date End Date Taking? Authorizing Provider  chlordiazePOXIDE (LIBRIUM) 25 MG capsule 50mg  PO TID x 1D, then 25-50mg  PO BID X 1D, then 25-50mg  PO QD X 1D 05/31/22  Yes Linwood Dibbles, MD  acetaminophen (TYLENOL) 325 MG tablet Take 2 tablets (650 mg total) by mouth every 6 (six) hours as needed  for mild pain (or Fever >/= 101). 11/18/21   Evette Georges, MD  ferrous sulfate 325 (65 FE) MG tablet Take 1 tablet (325 mg total) by mouth daily with breakfast. 05/25/22   [name], MD  folic acid (FOLVITE) 1 MG tablet Take 1 tablet (1 mg total) by mouth daily. 05/25/22   [name], MD  furosemide (LASIX) 80 MG tablet Take 1 tablet (80 mg total) by mouth daily. 05/25/22    [name], MD  lactulose (CHRONULAC) 10 GM/15ML solution Take 45 mLs (30 g total) by mouth every 6 (six) hours. 05/25/22   [name], MD  Multiple Vitamin (MULTIVITAMIN WITH MINERALS) TABS tablet Take 1 tablet by mouth daily. 02/20/22   Levin Erp, MD  pantoprazole (PROTONIX) 40 MG tablet Take 1 tablet (40 mg total) by mouth daily at 6 (six) AM. 05/25/22   [name], MD  propranolol (INDERAL) 10 MG tablet Take 1 tablet (10 mg total) by mouth 2 (two) times daily. 05/25/22   [name], MD  spironolactone (ALDACTONE) 100 MG tablet Take 2 tablets (200 mg total) by mouth daily. 05/25/22   [name], MD  thiamine (VITAMIN B1) 100 MG tablet Take 1 tablet (100 mg total) by mouth daily. 05/25/22   [name], MD    Physical Exam: Vitals:   05/31/22 1330 05/31/22 1345 05/31/22 1400 05/31/22 1409  BP: 118/65  116/67   Pulse: 95 95 97   Resp: 17 17 17    Temp:    100 F (37.8 C)  TempSrc:    Oral  SpO2: 100% 100% 100%   Weight:        Constitutional: Elderly male who appears chronically ill Eyes: PERRL, scleral icterus present ENMT: Mucous membranes are moist.  Neck: normal, supple Respiratory: Normal respiratory effort without significant wheezes or rhonchi appreciated. Cardiovascular: Regular rate and rhythm with murmur present.  1+ pitting edema of the bilateral lower extremities Abdomen: Abdominal distention, but soft post paracentesis.  Generalized tenderness palpation of the abdomen.  Umbilical hernia present that appears reducible. Musculoskeletal: no clubbing / cyanosis. No joint deformity upper and lower extremities. Good ROM, no contractures. Normal muscle tone.  Skin: no rashes, lesions, ulcers. No induration Neurologic: CN 2-12 grossly intact. Strength 5/5 in all 4.  Psychiatric: Normal judgment and insight. Normal mood.   Data Reviewed:  Reviewed labs, imaging, and pertinent records as noted above in the HPI  Assessment and  Plan:  Sepsis secondary to spontaneous bacterial peritonitis Alcoholic cirrhosis of the liver with ascites Acute.  Patient presented with complaints of abdominal pain.  Patient had been found to have fever up to 100.7 F with tachycardia, tachypnea, WBC 2.1, and  lactic acid 2 meeting SIRS criteria.    He underwent ultrasound-guided paracentesis with removal of 6 L of fluid and had been given albumin.  Paracentesis cultures grew out gram-negative rods.  Patient has been empirically treated with Rocephin IV.  Attempted to discuss case with [facility], but he had been dismissed earlier this year. -Admit to a telemetry bed -Follow-up cultures -Low-sodium diet -Continue Rocephin IV.  Adjust antibiotics as deemed medically appropriate -Oxycodone as needed for pain -Continue lactulose, spironolactone, propranolol, and furosemide -Consider need to formally consult [facility] in a.m.  Alcohol abuse Patient states that he only had 2 beers this week, and initial alcohol level was noted to be 273. -CIWA protocols with ativan IV -MVI,  thiamine, folic acid  Elevated liver function  test Alcoholic hepatitis Acute on chronic.  AST 154, AST 116, ALT 48, and total bilirubin 5.3.  Secondary to patient's alcohol abuse.  Maybrey discriminant factor greater than 32. -Pentoxifylline 400 mg 3 times daily given concern for infection -Continue to monitor liver function studies daily  Hyponatremia Acute on chronic. Sodium noted to be 126 on admission.  Suspect hypovolemic hyponatremia given patient's significant ascites. -Continue to monitor sodium levels  Pancytopenia Chronic.  Hemoglobin 12.4 and platelet count 63.  Appears similar to priors.  No evidence of bleeding at this time.  Thought secondary to patient's history of cirrhosis. -Continue to monitor  Umbilical hernia Chronic.  Present on admission, but appears reducible  GERD -Continue Protonix  Obesity BMI 34.57 kg/m   DVT prophylaxis:  SCDs Advance Care Planning:   Code Status: Full Code   Consults:   Family Communication: None  Severity of Illness: The appropriate patient status for this patient is INPATIENT. Inpatient status is judged to be reasonable and necessary in order to provide the required intensity of service to ensure the patient's safety. The patient's presenting symptoms, physical exam findings, and initial radiographic and laboratory data in the context of their chronic comorbidities is felt to place them at high risk for further clinical deterioration. Furthermore, it is not anticipated that the patient will be medically stable for discharge from the hospital within 2 midnights of admission.   * I certify that at the point of admission it is my clinical judgment that the patient will require inpatient hospital care spanning beyond 2 midnights from the point of admission due to high intensity of service, high risk for further deterioration and high frequency of surveillance required.*  Author: Clydie Braun, MD 05/31/2022 3:07 PM  For on call review [email or web address].

## 2022-05-31 NOTE — ED Notes (Signed)
Notified [facility] of pt being transported to their unit

## 2022-05-31 NOTE — Progress Notes (Signed)
Pt arrived to unit. Ambulated with standby assist from stretcher to bed. Pt expressing pain physically and verbally in his abdominal region. MD made aware of pain that was rated a 6 out of a 0-10 pain scale. Equal fall and rise of chest. A/Ox. Pt provided with warm blankets, bed alarms set, and call bell within reach.

## 2022-05-31 NOTE — ED Notes (Signed)
ED TO INPATIENT HANDOFF REPORT  ED Nurse Name and Phone #: [phone]  S Name/Age/Gender Mathew [name] 42 y.o. male Room/Bed: 023C/023C  Code Status   Code Status: Prior  Home/SNF/Other Home Patient oriented to: self, place, time, and situation Is this baseline? Yes   Triage Complete: Triage complete  Chief Complaint Spontaneous bacterial peritonitis (HCC) [K65.2]  Triage Note EMS from home c/o hernia X4-5 years abdominal is distended and tender.    162/90 HR 94 98% 150 CBG   Allergies No Known Allergies  Level of Care/Admitting Diagnosis ED Disposition     ED Disposition  Admit   Condition  --   Comment  Hospital Area: [facility] [[id number]]  Level of Care: Telemetry Medical [[id number]]  May admit patient to [name] or [facility] if equivalent level of care is available:: No  Covid Evaluation: Asymptomatic - no recent exposure (last 10 days) testing not required  Diagnosis: Spontaneous bacterial peritonitis (HCC) [454.09.ICD-9-CM]  Admitting Physician: Clydie Braun [[id number]]  Attending Physician: Clydie Braun [[id number]]  Certification:: I certify this patient will need inpatient services for at least 2 midnights  Estimated Length of Stay: 2          B Medical/Surgery History Past Medical History:  Diagnosis Date   Acute metabolic encephalopathy 06/20/2021   Alcohol withdrawal seizure (HCC)    Cirrhosis (HCC)    ETOH abuse    Pneumonia 04/02/2020   SBP (spontaneous bacterial peritonitis) (HCC) 06/17/2021   Past Surgical History:  Procedure Laterality Date   COLONOSCOPY WITH PROPOFOL N/A 03/17/2018   Procedure: COLONOSCOPY WITH PROPOFOL;  Surgeon: Bernette Redbird, MD;  Location: [facility] ENDOSCOPY;  Service: Endoscopy;  Laterality: N/A;   ESOPHAGOGASTRODUODENOSCOPY (EGD) WITH PROPOFOL N/A 03/17/2018   Procedure: ESOPHAGOGASTRODUODENOSCOPY (EGD) WITH PROPOFOL;  Surgeon: Bernette Redbird, MD;  Location: [facility] ENDOSCOPY;  Service:  Endoscopy;  Laterality: N/A;   IR PARACENTESIS  03/14/2018   IR PARACENTESIS  11/18/2020   IR PARACENTESIS  06/17/2021   IR PARACENTESIS  08/30/2021   IR PARACENTESIS  09/02/2021   IR PARACENTESIS  11/16/2021   IR PARACENTESIS  03/01/2022     A IV Location/Drains/Wounds Patient Lines/Drains/Airways Status     Active Line/Drains/Airways     Name Placement date Placement time Site Days   Peripheral IV 05/31/22 22 G Posterior;Right Hand 05/31/22  1512  Hand  less than 1            Intake/Output Last 24 hours  Intake/Output Summary (Last 24 hours) at 05/31/2022 1512 Last data filed at 05/31/2022 0959 Gross per 24 hour  Intake 1160 ml  Output --  Net 1160 ml    Labs/Imaging Results for orders placed or performed during the hospital encounter of 05/30/22 (from the past 48 hour(s))  CBC with Differential     Status: Abnormal   Collection Time: 05/30/22 11:40 PM  Result Value Ref Range   WBC 2.1 (L) 4.0 - 10.5 K/uL   RBC 3.48 (L) 4.22 - 5.81 MIL/uL   Hemoglobin 12.4 (L) 13.0 - 17.0 g/dL   HCT 30.8 (L) 65.7 - 84.6 %   MCV 103.4 (H) 80.0 - 100.0 fL   MCH 35.6 (H) 26.0 - 34.0 pg   MCHC 34.4 30.0 - 36.0 g/dL   RDW 96.2 95.2 - 84.1 %   Platelets 63 (L) 150 - 400 K/uL    Comment: Immature Platelet Fraction may be clinically indicated, consider ordering this additional test [id number] REPEATED TO VERIFY  nRBC 0.0 0.0 - 0.2 %   Neutrophils Relative % 66 %   Neutro Abs 1.4 (L) 1.7 - 7.7 K/uL   Lymphocytes Relative 26 %   Lymphs Abs 0.5 (L) 0.7 - 4.0 K/uL   Monocytes Relative 4 %   Monocytes Absolute 0.1 0.1 - 1.0 K/uL   Eosinophils Relative 2 %   Eosinophils Absolute 0.0 0.0 - 0.5 K/uL   Basophils Relative 1 %   Basophils Absolute 0.0 0.0 - 0.1 K/uL   Immature Granulocytes 1 %   Abs Immature Granulocytes 0.01 0.00 - 0.07 K/uL    Comment: Performed at [facility], [address] [place], [place] [address]  Comprehensive metabolic panel     Status: Abnormal    Collection Time: 05/30/22 11:40 PM  Result Value Ref Range   Sodium 126 (L) 135 - 145 mmol/L   Potassium 4.0 3.5 - 5.1 mmol/L   Chloride 99 98 - 111 mmol/L   CO2 18 (L) 22 - 32 mmol/L   Glucose, Bld 142 (H) 70 - 99 mg/dL    Comment: Glucose reference range applies only to samples taken after fasting for at least 8 hours.   BUN 5 (L) 6 - 20 mg/dL   Creatinine, Ser 4.09 0.61 - 1.24 mg/dL   Calcium 7.9 (L) 8.9 - 10.3 mg/dL   Total Protein 6.6 6.5 - 8.1 g/dL   Albumin 2.4 (L) 3.5 - 5.0 g/dL   AST 811 (H) 15 - 41 U/L   ALT 48 (H) 0 - 44 U/L   Alkaline Phosphatase 154 (H) 38 - 126 U/L   Total Bilirubin 5.3 (H) 0.3 - 1.2 mg/dL   GFR, Estimated >91 >47 mL/min    Comment: (NOTE) Calculated using the CKD-EPI Creatinine Equation (2021)    Anion gap 9 5 - 15    Comment: Performed at [facility], [address], [place], [place] [address]  Lipase, blood     Status: None   Collection Time: 05/30/22 11:40 PM  Result Value Ref Range   Lipase 39 11 - 51 U/L    Comment: Performed at [facility], [address] [place], [place] [address]  Protime-INR     Status: Abnormal   Collection Time: 05/30/22 11:40 PM  Result Value Ref Range   Prothrombin Time 20.8 (H) 11.4 - 15.2 seconds   INR 1.8 (H) 0.8 - 1.2    Comment: (NOTE) INR goal varies based on device and disease states. Performed at [facility], [address], [place], [place] [address]   Lactic acid, plasma     Status: Abnormal   Collection Time: 05/30/22 11:40 PM  Result Value Ref Range   Lactic Acid, Venous 2.0 (HH) 0.5 - 1.9 mmol/L    Comment: CRITICAL RESULT CALLED TO, READ BACK BY AND VERIFIED WITH Grover Canavan SWORD RN 05/31/22 0045 Enid Derry Performed at [facility], [address], [place], [place] [address]   Ethanol     Status: Abnormal   Collection Time: 05/31/22 12:18 AM  Result Value Ref Range   Alcohol, Ethyl (B) 243 (H) <10 mg/dL    Comment: (NOTE) Lowest detectable limit for serum alcohol is  10 mg/dL.  For medical purposes only. Performed at [facility], [address], [place], [place] [address]   Lactic acid, plasma     Status: Abnormal   Collection Time: 05/31/22  1:41 AM  Result Value Ref Range   Lactic  Acid, Venous 2.0 (HH) 0.5 - 1.9 mmol/L    Comment: CRITICAL VALUE NOTED. VALUE IS CONSISTENT WITH PREVIOUSLY REPORTED/CALLED VALUE Performed at [facility], [address], [place], [place] [address]   Ammonia     Status: Abnormal   Collection Time: 05/31/22  1:41 AM  Result Value Ref Range   Ammonia 46 (H) 9 - 35 umol/L    Comment: Performed at [facility], [address], [place], [place] [address]  Lactate dehydrogenase (pleural or peritoneal fluid)     Status: Abnormal   Collection Time: 05/31/22  3:20 AM  Result Value Ref Range   LD, Fluid 39 (H) 3 - 23 U/L    Comment: (NOTE) Results should be evaluated in conjunction with serum values    Fluid Type-FLDH PERITONEAL CAVITY     Comment: Performed at [facility], [address], [place], [place] [address]  Glucose, pleural or peritoneal fluid     Status: None   Collection Time: 05/31/22  3:20 AM  Result Value Ref Range   Glucose, Fluid 107 mg/dL    Comment: (NOTE) No normal range established for this test Results should be evaluated in conjunction with serum values    Fluid Type-FGLU PERITONEAL CAVITY     Comment: Performed at  [facility], [address], [place], [place] [address]  Protein, pleural or peritoneal fluid     Status: None   Collection Time: 05/31/22  3:20 AM  Result Value Ref Range   Total protein, fluid <3.0 g/dL    Comment: (NOTE) No normal range established for this test Results should be evaluated in conjunction with serum values    Fluid Type-FTP PERITONEAL CAVITY     Comment: Performed at [facility], [address] [place], [place] [address]  Albumin, pleural or peritoneal fluid      Status: None   Collection Time: 05/31/22  3:20 AM   Result Value Ref Range   Albumin, Fluid <1.5 g/dL    Comment: (NOTE) No normal range established for this test Results should be evaluated in conjunction with serum values    Fluid Type-FALB PERITONEAL CAVITY     Comment: Performed at [facility], [address], [place], [place] [address]  Culture, body fluid w Gram Stain-bottle     Status: None (Preliminary result)   Collection Time: 05/31/22  3:21 AM   Specimen: Fluid  Result Value Ref Range   Specimen Description FLUID    Special Requests PERITONEAL FL    Gram Stain      FEW WBC PRESENT, PREDOMINANTLY PMN NO ORGANISMS SEEN    Culture      GRAM NEGATIVE RODS CRITICAL RESULT CALLED TO, READ BACK BY AND VERIFIED WITH: DR. MATT  1435 [id number] FCP Performed at [facility], [address], [place], [place] [address]    Report Status PENDING   Urinalysis, Routine w reflex microscopic -Urine, Clean Catch     Status: Abnormal   Collection Time: 05/31/22  4:45 AM  Result Value Ref Range   Color, Urine AMBER (A) YELLOW    Comment: BIOCHEMICALS MAY BE AFFECTED BY COLOR   APPearance HAZY (A) CLEAR   Specific Gravity, Urine 1.013 1.005 - 1.030   pH 5.0 5.0 - 8.0   Glucose, UA NEGATIVE NEGATIVE mg/dL   Hgb urine dipstick LARGE (A) NEGATIVE   Bilirubin Urine SMALL (A) NEGATIVE   Ketones, ur NEGATIVE NEGATIVE mg/dL  Protein, ur 30 (A) NEGATIVE mg/dL   Nitrite NEGATIVE NEGATIVE   Leukocytes,Ua NEGATIVE NEGATIVE   RBC / HPF 6-10 0 - 5 RBC/hpf   WBC, UA 6-10 0 - 5 WBC/hpf   Bacteria, UA FEW (A) NONE SEEN   Squamous Epithelial / HPF 0-5 0 - 5 /HPF   Mucus PRESENT    Hyaline Casts, UA PRESENT    Granular Casts, UA PRESENT     Comment: Performed at [facility] Lab, [address], [place], [place] [address]   No results found.  Pending Labs Unresulted Labs (From admission, onward)     Start     Ordered   05/30/22 2331  Culture, blood (routine x 2)  BLOOD CULTURE X 2,   R (with STAT occurrences)      05/30/22 2331             Vitals/Pain Today's Vitals   05/31/22 1330 05/31/22 1345 05/31/22 1400 05/31/22 1409  BP: 118/65  116/67   Pulse: 95 95 97   Resp: 17 17 17    Temp:    100 F (37.8 C)  TempSrc:    Oral  SpO2: 100% 100% 100%   Weight:      PainSc:        Isolation Precautions No active isolations  Medications Medications  lactulose (CHRONULAC) 10 GM/15ML solution 30 g (30 g Oral Given 05/31/22 1301)  pantoprazole (PROTONIX) EC tablet 40 mg (has no administration in time range)  lidocaine (XYLOCAINE) 2 % (with pres) injection (  Given by Other 05/31/22 0428)  cefTRIAXone (ROCEPHIN) 2 g in sodium chloride 0.9 % 100 mL IVPB (0 g Intravenous Stopped 05/31/22 0635)  sodium chloride 0.9 % bolus 1,000 mL (0 mLs Intravenous Stopped 05/31/22 0634)  ketorolac (TORADOL) 15 MG/ML injection 15 mg (15 mg Intravenous Given 05/31/22 0702)  albumin human 25 % solution 12.5 g (0 g Intravenous Stopped 05/31/22 0959)  chlordiazePOXIDE (LIBRIUM) capsule 25 mg (25 mg Oral Given 05/31/22 6045)    Mobility walks     Focused Assessments    R Recommendations: See Admitting Provider Note  Report given to:   Additional Notes: Pt A&Ox4, can speak English, was going to be D/C'd but cultures came back positive on peritoneal fluid.

## 2022-05-31 NOTE — ED Notes (Signed)
EDP at bedside  

## 2022-05-31 NOTE — Discharge Instructions (Addendum)
Try to stop drinking alcohol.  Start taking the medications you were prescribed at the doctor's office.  The new medication can help prevent shakes and withdrawal symptoms.  Follow-up with your primary care doctor and consider following up with a treatment center to help you with your alcohol use

## 2022-05-31 NOTE — Progress Notes (Signed)
TRH night cross cover note:   I was notified by RN that this patient is complaining persistent abdominal discomfort and is also complaining of muscle cramps involving his bilateral lower extremities and hands.  Per brief chart review, he is here with acutely decompensated alcoholic liver failure.  I placed order for as needed IV Dilaudid for the above.    Mathew Pigg, DO Hospitalist

## 2022-05-31 NOTE — Progress Notes (Signed)
Ipad use for translator with pt this shift.

## 2022-05-31 NOTE — Procedures (Signed)
PROCEDURE SUMMARY:  Successful ultrasound guided paracentesis from the right lower quadrant.  Yielded 6 L of clear yellow fluid.  No immediate complications.  The patient tolerated the procedure well.   Specimen not sent for labs.  EBL < 2 mL  The patient has required >/=2 paracenteses in a 30 day period and a screening evaluation by the [facility] has been arranged.  Alwyn Ren, [place] [phone] 05/31/2022, 11:53 AM

## 2022-05-31 NOTE — ED Notes (Signed)
Pt in IR at this time.  

## 2022-06-01 DIAGNOSIS — F1010 Alcohol abuse, uncomplicated: Secondary | ICD-10-CM

## 2022-06-01 DIAGNOSIS — D696 Thrombocytopenia, unspecified: Secondary | ICD-10-CM

## 2022-06-01 LAB — CBC
HCT: 27.4 % — ABNORMAL LOW (ref 39.0–52.0)
Hemoglobin: 9.5 g/dL — ABNORMAL LOW (ref 13.0–17.0)
MCH: 35.6 pg — ABNORMAL HIGH (ref 26.0–34.0)
MCHC: 34.7 g/dL (ref 30.0–36.0)
MCV: 102.6 fL — ABNORMAL HIGH (ref 80.0–100.0)
Platelets: 26 10*3/uL — CL (ref 150–400)
RBC: 2.67 MIL/uL — ABNORMAL LOW (ref 4.22–5.81)
RDW: 12.5 % (ref 11.5–15.5)
WBC: 2.9 10*3/uL — ABNORMAL LOW (ref 4.0–10.5)
nRBC: 0 % (ref 0.0–0.2)

## 2022-06-01 LAB — CBC WITH DIFFERENTIAL/PLATELET

## 2022-06-01 LAB — COMPREHENSIVE METABOLIC PANEL
ALT: 32 U/L (ref 0–44)
AST: 63 U/L — ABNORMAL HIGH (ref 15–41)
Albumin: 1.9 g/dL — ABNORMAL LOW (ref 3.5–5.0)
Alkaline Phosphatase: 85 U/L (ref 38–126)
Anion gap: 4 — ABNORMAL LOW (ref 5–15)
BUN: 15 mg/dL (ref 6–20)
CO2: 20 mmol/L — ABNORMAL LOW (ref 22–32)
Calcium: 7.2 mg/dL — ABNORMAL LOW (ref 8.9–10.3)
Chloride: 102 mmol/L (ref 98–111)
Creatinine, Ser: 0.87 mg/dL (ref 0.61–1.24)
GFR, Estimated: >60 mL/min (ref >60)
Glucose, Bld: 119 mg/dL — ABNORMAL HIGH (ref 70–99)
Potassium: 3.5 mmol/L (ref 3.5–5.1)
Sodium: 126 mmol/L — ABNORMAL LOW (ref 135–145)
Total Bilirubin: 5.2 mg/dL — ABNORMAL HIGH (ref 0.3–1.2)
Total Protein: 5 g/dL — ABNORMAL LOW (ref 6.5–8.1)

## 2022-06-01 LAB — LIPASE

## 2022-06-01 LAB — MAGNESIUM: Magnesium: 1.8 mg/dL (ref 1.7–2.4)

## 2022-06-01 LAB — CULTURE, BLOOD (ROUTINE X 2): Culture: NO GROWTH

## 2022-06-01 LAB — PHOSPHORUS: Phosphorus: 3.1 mg/dL (ref 2.5–4.6)

## 2022-06-01 LAB — CULTURE, BODY FLUID W GRAM STAIN -BOTTLE

## 2022-06-01 LAB — PROTIME-INR

## 2022-06-01 LAB — OCCULT BLOOD X 1 CARD TO LAB, STOOL: Fecal Occult Bld: NEGATIVE

## 2022-06-01 MED ORDER — NADOLOL 20 MG PO TABS
20.0000 mg | ORAL_TABLET | Freq: Every day | ORAL | Status: DC
  Administered 2022-06-02: 20 mg via ORAL
  Filled 2022-06-01: qty 1

## 2022-06-01 MED ORDER — ALBUMIN HUMAN 5 % IV SOLN
100.0000 g | Freq: Once | INTRAVENOUS | Status: DC
  Filled 2022-06-01: qty 2000

## 2022-06-01 MED ORDER — ALBUMIN HUMAN 25 % IV SOLN
12.5000 g | Freq: Once | INTRAVENOUS | Status: AC
  Administered 2022-06-01: 12.5 g via INTRAVENOUS
  Filled 2022-06-01: qty 50

## 2022-06-01 MED ORDER — ALBUMIN HUMAN 25 % IV SOLN
100.0000 g | Freq: Once | INTRAVENOUS | Status: AC
  Administered 2022-06-01: 100 g via INTRAVENOUS
  Filled 2022-06-01: qty 400

## 2022-06-01 MED ORDER — FUROSEMIDE 40 MG PO TABS
40.0000 mg | ORAL_TABLET | Freq: Every day | ORAL | Status: DC
  Administered 2022-06-01 – 2022-06-02 (×2): 40 mg via ORAL
  Filled 2022-06-01 (×2): qty 1

## 2022-06-01 MED ORDER — ALBUMIN HUMAN 5 % IV SOLN: 100.0000 g | Freq: Once | INTRAVENOUS | Status: DC

## 2022-06-01 MED ORDER — SPIRONOLACTONE 25 MG PO TABS
100.0000 mg | ORAL_TABLET | Freq: Every day | ORAL | Status: DC
  Administered 2022-06-01 – 2022-06-02 (×2): 100 mg via ORAL
  Filled 2022-06-01 (×2): qty 4

## 2022-06-01 NOTE — Consult Note (Addendum)
Consultation  Referring Provider:  [facility]  Primary Care Physician:  [name], MD Primary Gastroenterologist:  dismissed from [facility], unassigned       Reason for Consultation:     Decompensated cirrhosis and SBP  LOS: 1 day          HPI:   Mathew [name] is a 42 y.o. male with past medical history significant for compensated cirrhosis, history of SBP, umbilical hernia, alcohol abuse, tobacco abuse, esophageal varices, presents for evaluation of abdominal pain.  Patient presented to ED 5/20 p.m. for abdominal pain that has been going on for 2 to 3 days.  States he has had severe pain across his abdomen which has been intermittent and getting progressively worse.  Exacerbated by movement and by touch.  Reports he may have had some fevers, no chills.  Denies melena/hematochezia.  Denies nausea/vomiting.  States he drank 2 beers on Saturday.  Upon arrival alcohol level 243.  He underwent diagnostic/therapeutic paracentesis 5/16 with 4.8 L removed.  Labs were negative for bacterial peritonitis and had no growth on cultures.  Patient was given Rocephin 2G IV for prophylaxis in ED prior to paracentesis  Repeat diagnostic/therapeutic paracentesis on admission 5/21 with 6 L removed.  Culture for gram-negative rods. Cell count/cytology was not obtained.  Upon arrival temperature 100 F, slightly tachycardic with heart rate 113.    Pertinent lab values upon admission WBC 2.1, Hgb 12.4, platelets 63.   BUN 5, creatinine 0.73 AST 116/ALT 48/total bilirubin 5.3 PT 20.8, INR 1.8 Ammonia 46 Alcohol level 243 UA with large hemoglobin, few bacteria, 6-10 RBC, 6-10 WBC   Previous GI workup EGD 03/2018 inpatient for IDA with Dr. Matthias Hughs: Grade 2 esophageal varices, 2 cm hiatal hernia, portal hypertensive gastropathy, friable gastric mucosa, normal duodenum.  Colonoscopy 03/2018 for IDA: Friable colonic mucosa, no specimens collected  Past Medical History:  Diagnosis Date    Acute metabolic encephalopathy 06/20/2021   Alcohol withdrawal seizure (HCC)    Cirrhosis (HCC)    ETOH abuse    Pneumonia 04/02/2020   SBP (spontaneous bacterial peritonitis) (HCC) 06/17/2021    Surgical History:  He  has a past surgical history that includes IR Paracentesis (03/14/2018); Esophagogastroduodenoscopy (egd) with propofol (N/A, 03/17/2018); Colonoscopy with propofol (N/A, 03/17/2018); IR Paracentesis (11/18/2020); IR Paracentesis (06/17/2021); IR Paracentesis (08/30/2021); IR Paracentesis (09/02/2021); IR Paracentesis (11/16/2021); IR Paracentesis (03/01/2022); and IR Paracentesis (05/31/2022). Family History:  His family history is not on file. He was adopted. Social History:   reports that he has been smoking cigarettes. He started smoking about 2 years ago. He has been smoking an average of .25 packs per day. He has never used smokeless tobacco. He reports current alcohol use of about 2.0 standard drinks of alcohol per week. He reports that he does not use drugs.  Prior to Admission medications   Medication Sig Start Date End Date Taking? Authorizing Provider  chlordiazePOXIDE (LIBRIUM) 25 MG capsule 50mg  PO TID x 1D, then 25-50mg  PO BID X 1D, then 25-50mg  PO QD X 1D 05/31/22  Yes Linwood Dibbles, MD  acetaminophen (TYLENOL) 325 MG tablet Take 2 tablets (650 mg total) by mouth every 6 (six) hours as needed for mild pain (or Fever >/= 101). 11/18/21   Evette Georges, MD  ferrous sulfate 325 (65 FE) MG tablet Take 1 tablet (325 mg total) by mouth daily with breakfast. 05/25/22   [name], MD  folic acid (FOLVITE) 1 MG tablet Take 1 tablet (  1 mg total) by mouth daily. 05/25/22   [name], MD  furosemide (LASIX) 80 MG tablet Take 1 tablet (80 mg total) by mouth daily. 05/25/22   [name], MD  lactulose (CHRONULAC) 10 GM/15ML solution Take 45 mLs (30 g total) by mouth every 6 (six) hours. 05/25/22   [name], MD  Multiple Vitamin (MULTIVITAMIN WITH MINERALS) TABS tablet  Take 1 tablet by mouth daily. 02/20/22   Levin Erp, MD  pantoprazole (PROTONIX) 40 MG tablet Take 1 tablet (40 mg total) by mouth daily at 6 (six) AM. 05/25/22   [name], MD  propranolol (INDERAL) 10 MG tablet Take 1 tablet (10 mg total) by mouth 2 (two) times daily. 05/25/22   [name], MD  spironolactone (ALDACTONE) 100 MG tablet Take 2 tablets (200 mg total) by mouth daily. 05/25/22   [name], MD  thiamine (VITAMIN B1) 100 MG tablet Take 1 tablet (100 mg total) by mouth daily. 05/25/22   [name], MD    Current Facility-Administered Medications  Medication Dose Route Frequency Provider Last Rate Last Admin   acetaminophen (TYLENOL) tablet 650 mg  650 mg Oral Q8H PRN Madelyn Flavors A, MD   650 mg at 05/31/22 1810   albuterol (PROVENTIL) (2.5 MG/3ML) 0.083% nebulizer solution 2.5 mg  2.5 mg Nebulization Q6H PRN Madelyn Flavors A, MD       cefTRIAXone (ROCEPHIN) 2 g in sodium chloride 0.9 % 100 mL IVPB  2 g Intravenous Q24H Katrinka Blazing, Rondell A, MD 200 mL/hr at 06/01/22 0215 2 g at 06/01/22 0215   folic acid (FOLVITE) tablet 1 mg  1 mg Oral Daily Madelyn Flavors A, MD   1 mg at 06/01/22 0844   HYDROmorphone (DILAUDID) injection 0.5 mg  0.5 mg Intravenous Q3H PRN Howerter, Justin B, DO   0.5 mg at 05/31/22 2228   lactulose (CHRONULAC) 10 GM/15ML solution 30 g  30 g Oral Q6H Smith, Rondell A, MD   30 g at 06/01/22 1140   LORazepam (ATIVAN) injection 0-4 mg  0-4 mg Intravenous Q6H Smith, Rondell A, MD       Followed by   Melene Muller ON 06/02/2022] LORazepam (ATIVAN) injection 0-4 mg  0-4 mg Intravenous Q12H Smith, Rondell A, MD       LORazepam (ATIVAN) tablet 1-4 mg  1-4 mg Oral Q1H PRN Clydie Braun, MD       Or   LORazepam (ATIVAN) injection 1-4 mg  1-4 mg Intravenous Q1H PRN Clydie Braun, MD       multivitamin with minerals tablet 1 tablet  1 tablet Oral Daily Madelyn Flavors A, MD   1 tablet at 06/01/22 0844   naloxone (NARCAN) injection 0.4 mg  0.4 mg  Intravenous PRN Howerter, Justin B, DO       ondansetron (ZOFRAN) tablet 4 mg  4 mg Oral Q6H PRN Madelyn Flavors A, MD       Or   ondansetron (ZOFRAN) injection 4 mg  4 mg Intravenous Q6H PRN Smith, Rondell A, MD       oxyCODONE (Oxy IR/ROXICODONE) immediate release tablet 5 mg  5 mg Oral Q6H PRN Madelyn Flavors A, MD   5 mg at 05/31/22 2014   pantoprazole (PROTONIX) EC tablet 40 mg  40 mg Oral Q0600 Madelyn Flavors A, MD   40 mg at 06/01/22 0648   sodium chloride flush (NS) 0.9 % injection 3 mL  3 mL Intravenous Q12H Smith, Rondell A,  MD   3 mL at 06/01/22 0844   thiamine (VITAMIN B1) tablet 100 mg  100 mg Oral Daily Madelyn Flavors A, MD   100 mg at 06/01/22 1610   Or   thiamine (VITAMIN B1) injection 100 mg  100 mg Intravenous Daily Clydie Braun, MD        Allergies as of 05/30/2022   (No Known Allergies)    Review of Systems  Constitutional:  Positive for fever. Negative for chills, malaise/fatigue and weight loss.  HENT:  Negative for hearing loss and tinnitus.   Eyes:  Negative for blurred vision and double vision.  Respiratory:  Negative for cough and hemoptysis.   Cardiovascular:  Negative for chest pain and palpitations.  Gastrointestinal:  Positive for abdominal pain. Negative for blood in stool, constipation, diarrhea, heartburn, melena, nausea and vomiting.  Genitourinary:  Negative for dysuria and urgency.  Musculoskeletal:  Negative for myalgias and neck pain.  Skin:  Negative for itching and rash.  Neurological:  Negative for seizures and loss of consciousness.  Psychiatric/Behavioral:  Negative for depression and suicidal ideas.        Physical Exam:  Vital signs in last 24 hours: Temp:  [98.6 F (37 C)-100.7 F (38.2 C)] 99.1 F (37.3 C) (05/22 1120) Pulse Rate:  [93-120] 111 (05/22 1120) Resp:  [16-20] 18 (05/22 1120) BP: (111-142)/(63-84) 123/69 (05/22 1120) SpO2:  [96 %-100 %] 97 % (05/22 1120) Last BM Date : 06/01/22 Last BM recorded by nurses in past 5  days Stool Type: Type 7 (Liquid consistency with no solid pieces) (06/01/2022 12:00 PM)  Physical Exam Constitutional:      Appearance: He is ill-appearing.  HENT:     Nose: Nose normal. No congestion.     Mouth/Throat:     Mouth: Mucous membranes are moist.     Pharynx: Oropharynx is clear.  Eyes:     General: Scleral icterus present.     Extraocular Movements: Extraocular movements intact.  Cardiovascular:     Rate and Rhythm: Regular rhythm.     Comments: Tachycardic Pulmonary:     Effort: Pulmonary effort is normal. No respiratory distress.  Abdominal:     Comments: Distended abdomen, somewhat tight, dullness to percussion, diffuse exquisite tenderness to palpation  Musculoskeletal:     Cervical back: Normal range of motion and neck supple.     Comments: Some pedal edema bilaterally  Neurological:     General: No focal deficit present.     Mental Status: He is alert and oriented to person, place, and time.  Psychiatric:        Mood and Affect: Mood normal.        Behavior: Behavior normal.        Thought Content: Thought content normal.        Judgment: Judgment normal.      LAB RESULTS: Recent Labs    05/30/22 2340 06/01/22 0446  WBC 2.1* 2.9*  HGB 12.4* 9.5*  HCT 36.0* 27.4*  PLT 63* 26*   BMET Recent Labs    05/30/22 2340 06/01/22 0446  NA 126* 126*  K 4.0 3.5  CL 99 102  CO2 18* 20*  GLUCOSE 142* 119*  BUN 5* 15  CREATININE 0.73 0.87  CALCIUM 7.9* 7.2*   LFT Recent Labs    06/01/22 0446  PROT 5.0*  ALBUMIN 1.9*  AST 63*  ALT 32  ALKPHOS 85  BILITOT 5.2*   PT/INR Recent Labs    05/30/22 2340  LABPROT 20.8*  INR 1.8*    STUDIES: IR Paracentesis  Result Date: 05/31/2022 INDICATION: Patient with a history of cirrhosis with recurrent ascites. Interventional Radiology asked to perform a therapeutic paracentesis. EXAM: ULTRASOUND GUIDED PARACENTESIS MEDICATIONS: 1% lidocaine 10 ml . COMPLICATIONS: None immediate. PROCEDURE: Informed  written consent was obtained from the patient after a discussion of the risks, benefits and alternatives to treatment. A timeout was performed prior to the initiation of the procedure. Initial ultrasound scanning demonstrates a large amount of ascites within the right lower abdominal quadrant. The right lower abdomen was prepped and draped in the usual sterile fashion. 1% lidocaine was used for local anesthesia. Following this, a 19 gauge, 7-cm, Yueh catheter was introduced. An ultrasound image was saved for documentation purposes. The paracentesis was performed. The catheter was removed and a dressing was applied. The patient tolerated the procedure well without immediate post procedural complication. Patient received post-procedure intravenous albumin; see nursing notes for details. FINDINGS: A total of approximately 6 L of clear yellow fluid was removed. IMPRESSION: Successful ultrasound-guided paracentesis yielding 6 liters of peritoneal fluid. Procedure performed by: Alwyn Ren, NP PLAN: The patient has required >/=2 paracenteses in a 30 day period and a formal evaluation by the [facility] Interventional Radiology [facility] has been arranged. Electronically Signed   By: Gilmer Mor D.O.   On: 05/31/2022 17:04      Impression    Decompensated alcoholic cirrhosis Sepsis secondary to SBP Ammonia 46 MELD 3.0: 26 SAAG <1.1, ascites likely not from portal hypertension Paracentesis 5/21: 6 L fluid yielded, gram-negative rods on culture - Doppler r/o portal vein thrombosis - Serial INR, daily - Monitor daily MELD - Continue to trend LFTs - Spironolactone 100mg /lasix 40mg  - 2 gram sodium diet, nutrition consult - Fluid restrict to 2L with presence of hyponatremia (126) - Ceftriaxone 2 G IV + albumin bolus - Continue lactulose, titrate to 2-3BMs per day  At some point patient will need repeat EGD for evaluation of varices whether inpatient versus outpatient.  No bleeding,  hemoglobin stable. Platelets ideally will need to be > 50 prior to procedure when indicated  Alcoholic hepatitis(?) AST 63/ALT 32/alk phos 85 T. bili 5.2 Maddrey's DF: 46.1 - Appears LFTs have been chronically elevated over the last few months.  Although discriminant function score is above 32, unsure if this is truly considered acute alcoholic hepatitis versus progressive chronic liver disease.  Bilirubin 3 months ago was 3.3, 7 days ago was 4.6, today it is 5.2.  And similarly PT/INR have been elevated as well. - Will discuss with Dr. Adela Lank if steroids are recommended in this case.  Pancytopenia WBC 2.9, Hgb 9.5, platelets 26 Secondary to cirrhosis  Alcohol abuse - CIWA protocol - MVA, thiamine, folic acid  Thank you for your kind consultation, we will continue to follow.  Bayley Leanna Sato  06/01/2022, 12:02 PM    Attending physician's note  I have taken a history, reviewed the chart and examined the patient. I performed a substantive portion of this encounter, including complete performance of at least one of the key components, in conjunction with the APP. I agree with the APP's note, impression and recommendations.   42 year old male with decompensated cirrhosis, alcohol abuse, SBP admitted with abdominal pain, fever and chills. Diagnostic paracentesis with gram-negative rods on 5/21 in ascitic fluid, cell count was not done  MELD 3.0: 26 at 06/01/2022  4:46 AM MELD-Na: 27 at 06/01/2022  4:46 AM  Acute alcoholic hepatitis/worsening decompensation of  liver in the setting of SBP and ongoing EtOH use Discriminant functions greater than 32 but given active infection with SBP will hold off starting steroids Continue ceftriaxone Albumin 100mg  IV X1  Repeat diagnostic paracentesis tomorrow to make sure SBP is improving  Monitor for alcohol withdrawal Discussed alcohol cessation  No evidence of active GI bleed, has severe iron deficiency anemia Pancytopenia likely secondary  to chronic alcohol abuse with bone marrow suppression and cirrhosis  Severe protein calorie malnutrition  Overall prognosis is extremely poor He will benefit from palliative care consult to discuss goals of care    The patient was provided an opportunity to ask questions and all were answered. The patient agreed with the plan and demonstrated an understanding of the instructions.  Iona Beard , MD [phone]

## 2022-06-01 NOTE — Progress Notes (Signed)
PROGRESS NOTE   Mathew [name]  JXB:[id number] DOB: Feb 01, 1980 DOA: 05/30/2022 PCP: [name], MD  Brief Narrative:  42 year old Hispanic male Known EtOH abuse disorder with underlying chronic thrombocytopenia, chronic SBP, previous hepatic encephalopathy on lactulose/Xifaxan, prior SMV thrombosis Prior UGIB 03/2023 + grade 2 varices Dr. Ellin Mayhew has been fired by [name] to visit his primary care physician [name] 2/2 increasing abdominal, lower extremity, hand and feet swelling Underwent ultrasound-guided paracentesis 5/16-which turned out to be negative Came to emergency room with abdominal pain (known history of abdominal hernia) Referred for paracentesis underwent 6 L drainage--- lab called EDP as cultures = gram-negative rods Tmax 100, pulse 113 WBC 2.1 hemoglobin 12 platelet 63, sodium 126  Hospital-Problem based course  Sepsis on admission 2/2 SBP secondary to gram-negative rods Continue Rocephin every 24 Narrow as appropriate Appreciate of GI-albumin 100 g ordered today, repeat paracentesis with cell counts tomorrow I have briefly touched on the patient having a life limiting illness and will asked palliative care to see the patient for goals of care and will reach out to TOC-patient lives in a community but everyone works so unclear disposition overall  Chronic ethanolism, alcoholic cirrhosis 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 CIWA score is 0 we will observe overnight and if negative again tomorrow we will discontinue Ativan  coverage  Prior SMV thrombosis Repeat Doppler ultrasound pending  Severe thrombocytopenia now 26--moderate pancytopenia Sepsis versus sepsis superimposed on chronic underlying cirrhosis Obtain differential and ANC in the morning  Macrocytic anemia with acute component Hemoccult at bedside was negative per nursing-no active bleeding but is nauseous Observe for trends  Umbilical hernia reducible  Hypervolemic hyponatremia Fluid restrict 1500 cc with strict salt limitation Expect should improve   DVT prophylaxis: scd Code Status: Full Family Communication: None Disposition:  Status is: Inpatient Remains inpatient appropriate because:   Patient remains ill    Subjective: Nauseous++ has not really eaten his lunch which remains untouched at bedside Abdomen has discomfort He feels only slightly better than yesterday He understands a little bit about what was said to him by the gastroenterologist but seems surprised when I talked him about palliative care-I mentioned that this may be something that might be inevitable despite our best efforts and have told him that I am getting palliative care to see him  Objective: Vitals:   05/31/22 1949 05/31/22 2134 06/01/22 0402 06/01/22 0552  BP: 132/63 119/63 122/71 126/74  Pulse: (!) 120 (!) 116 99 96  Resp: 18 18  18   Temp: 99.6 F (37.6 C) 99.9 F (37.7 C) 99.2 F (37.3 C) 98.9 F (37.2 C)  TempSrc:      SpO2: 97% 98% 97% 100%  Weight:        Intake/Output Summary (Last 24 hours) at 06/01/2022 0729 Last data filed at 05/31/2022 1308 Gross per 24 hour  Intake 60 ml  Output --  Net 60 ml   Filed Weights   05/30/22 2322  Weight: 83 kg    Examination:  Looks about stated age no icterus no pallor Distended abdomen with shifting dullness midline reducible umbilical  hernia Tender to touch Trace lower extremity edema Unable to sit up properly so full chest exam deferred S1-S2 mildly tachcardic  Data Reviewed:  personally reviewed   CBC    Component Value Date/Time   WBC 2.9 (L) 06/01/2022 0446   RBC 2.67 (L) 06/01/2022 0446   HGB 9.5 (L) 06/01/2022 0446   HGB CANCELED 05/25/2022 1152   HCT 27.4 (L) 06/01/2022 0446   HCT CANCELED 05/25/2022 1152   PLT 26 (LL) 06/01/2022 0446   PLT CANCELED 05/25/2022 1152   MCV 102.6 (H) 06/01/2022 0446   MCV CANCELED 05/25/2022 1152   MCH 35.6 (H) 06/01/2022 0446   MCHC 34.7 06/01/2022 0446   RDW 12.5 06/01/2022 0446   RDW CANCELED 05/25/2022 1152   LYMPHSABS 0.5 (L) 05/30/2022 2340   LYMPHSABS CANCELED 05/25/2022 1152   MONOABS 0.1 05/30/2022 2340   EOSABS 0.0 05/30/2022 2340   EOSABS CANCELED 05/25/2022 1152   BASOSABS 0.0 05/30/2022 2340   BASOSABS CANCELED 05/25/2022 1152      Latest Ref Rng & Units 06/01/2022    4:46 AM 05/30/2022   11:40 PM 05/25/2022   11:52 AM  CMP  Glucose 70 - 99 mg/dL 409  811  WILL FOLLOW  P  BUN 6 - 20 mg/dL 15  5  WILL FOLLOW  P  Creatinine 0.61 - 1.24 mg/dL 9.14  7.82  WILL FOLLOW  P  Sodium 135 - 145 mmol/L 126  126  WILL FOLLOW  P  Potassium 3.5 - 5.1 mmol/L 3.5  4.0  WILL FOLLOW  P  Chloride 98 - 111 mmol/L 102  99  WILL FOLLOW  P  CO2 22 - 32 mmol/L 20  18  WILL FOLLOW  P  Calcium 8.9 - 10.3 mg/dL 7.2  7.9  WILL FOLLOW  P  Total Protein 6.5 - 8.1 g/dL 5.0  6.6  WILL FOLLOW  P  Total Bilirubin 0.3 - 1.2 mg/dL 5.2  5.3  WILL FOLLOW  P  Alkaline Phos 38 - 126 U/L 85  154  WILL FOLLOW  P  AST 15 - 41 U/L 63  116  WILL FOLLOW  P  ALT 0 - 44 U/L 32  48  WILL FOLLOW  P    P Preliminary result     Radiology Studies: IR Paracentesis  Result Date: 05/31/2022 INDICATION: Patient with a history of cirrhosis with recurrent ascites. Interventional Radiology asked to perform a therapeutic paracentesis. EXAM: ULTRASOUND GUIDED PARACENTESIS MEDICATIONS: 1% lidocaine 10 ml . COMPLICATIONS: None immediate. PROCEDURE: Informed written consent was obtained from the patient after a discussion of the risks, benefits and  alternatives to treatment. A timeout was performed prior to the initiation of the procedure. Initial ultrasound scanning demonstrates a large amount of ascites within the right lower abdominal quadrant. The right lower abdomen was prepped and draped in the usual sterile fashion. 1% lidocaine was used for local anesthesia. Following this, a 19 gauge, 7-cm, Yueh catheter was introduced. An ultrasound image was saved for documentation purposes. The paracentesis was performed. The catheter was removed and a dressing was applied. The patient tolerated the procedure well without immediate post procedural complication. Patient received post-procedure intravenous albumin; see nursing notes for details. FINDINGS: A total of approximately 6 L of clear yellow fluid was removed. IMPRESSION: Successful ultrasound-guided paracentesis yielding 6 liters of peritoneal fluid. Procedure performed by: Alwyn Ren, NP PLAN: The patient has required >/=2 paracenteses in a 30 day period and a formal evaluation by the [facility] Interventional  Radiology Portal Hypertension Clinic has been arranged. Electronically Signed   By: Gilmer Mor D.O.   On: 05/31/2022 17:04     Scheduled Meds:  folic acid  1 mg Oral Daily   lactulose  30 g Oral Q6H   LORazepam  0-4 mg Intravenous Q6H   Followed by   Melene Muller ON 06/02/2022] LORazepam  0-4 mg Intravenous Q12H   multivitamin with minerals  1 tablet Oral Daily   pantoprazole  40 mg Oral Q0600   sodium chloride flush  3 mL Intravenous Q12H   thiamine  100 mg Oral Daily   Or   thiamine  100 mg Intravenous Daily   Continuous Infusions:  cefTRIAXone (ROCEPHIN)  IV 2 g (06/01/22 0215)     LOS: 1 day   Time spent: 67  Rhetta Mura, MD [facility] To contact the attending provider between 7A-7P or the covering provider during after hours 7P-7A, please log into the web site [email or web address] and access using universal [place] password for that web site. If you do not  have the password, please call the hospital operator.  06/01/2022, 7:29 AM

## 2022-06-02 ENCOUNTER — Inpatient Hospital Stay (HOSPITAL_COMMUNITY): Payer: Medicaid Other

## 2022-06-02 DIAGNOSIS — Z515 Encounter for palliative care: Secondary | ICD-10-CM

## 2022-06-02 DIAGNOSIS — Z7189 Other specified counseling: Secondary | ICD-10-CM

## 2022-06-02 HISTORY — PX: IR PARACENTESIS: IMG2679

## 2022-06-02 LAB — GRAM STAIN

## 2022-06-02 LAB — COMPREHENSIVE METABOLIC PANEL
ALT: 23 U/L (ref 0–44)
AST: 39 U/L (ref 15–41)
Albumin: 2.9 g/dL — ABNORMAL LOW (ref 3.5–5.0)
Alkaline Phosphatase: 55 U/L (ref 38–126)
Anion gap: 8 (ref 5–15)
BUN: 20 mg/dL (ref 6–20)
CO2: 18 mmol/L — ABNORMAL LOW (ref 22–32)
Calcium: 7.9 mg/dL — ABNORMAL LOW (ref 8.9–10.3)
Chloride: 101 mmol/L (ref 98–111)
Creatinine, Ser: 1.01 mg/dL (ref 0.61–1.24)
GFR, Estimated: >60 mL/min (ref >60)
Glucose, Bld: 89 mg/dL (ref 70–99)
Potassium: 3.1 mmol/L — ABNORMAL LOW (ref 3.5–5.1)
Sodium: 127 mmol/L — ABNORMAL LOW (ref 135–145)
Total Bilirubin: 5.8 mg/dL — ABNORMAL HIGH (ref 0.3–1.2)
Total Protein: 5.3 g/dL — ABNORMAL LOW (ref 6.5–8.1)

## 2022-06-02 LAB — BODY FLUID CELL COUNT WITH DIFFERENTIAL
Eos, Fluid: 0 %
Lymphs, Fluid: 13 %
Monocyte-Macrophage-Serous Fluid: 17 % — ABNORMAL LOW (ref 50–90)
Neutrophil Count, Fluid: 70 % — ABNORMAL HIGH (ref 0–25)
Total Nucleated Cell Count, Fluid: 8644 cu mm — ABNORMAL HIGH (ref 0–1000)

## 2022-06-02 LAB — CBC
HCT: 23.7 % — ABNORMAL LOW (ref 39.0–52.0)
Hemoglobin: 8.4 g/dL — ABNORMAL LOW (ref 13.0–17.0)
MCH: 35.9 pg — ABNORMAL HIGH (ref 26.0–34.0)
MCHC: 35.4 g/dL (ref 30.0–36.0)
MCV: 101.3 fL — ABNORMAL HIGH (ref 80.0–100.0)
Platelets: 27 10*3/uL — CL (ref 150–400)
RBC: 2.34 MIL/uL — ABNORMAL LOW (ref 4.22–5.81)
RDW: 12.5 % (ref 11.5–15.5)
WBC: 3.2 10*3/uL — ABNORMAL LOW (ref 4.0–10.5)
nRBC: 0 % (ref 0.0–0.2)

## 2022-06-02 LAB — ALBUMIN, PLEURAL OR PERITONEAL FLUID: Albumin, Fluid: <1.5 g/dL

## 2022-06-02 LAB — PROTEIN, PLEURAL OR PERITONEAL FLUID: Total protein, fluid: <3 g/dL

## 2022-06-02 LAB — CULTURE, BLOOD (ROUTINE X 2)
Culture: NO GROWTH
Special Requests: ADEQUATE

## 2022-06-02 LAB — PROTIME-INR
INR: 2.5 — ABNORMAL HIGH (ref 0.8–1.2)
Prothrombin Time: 27.4 seconds — ABNORMAL HIGH (ref 11.4–15.2)

## 2022-06-02 LAB — CULTURE, BODY FLUID W GRAM STAIN -BOTTLE

## 2022-06-02 MED ORDER — SODIUM CHLORIDE 0.9 % IV SOLN
1.0000 g | Freq: Every day | INTRAVENOUS | Status: DC
  Administered 2022-06-02 – 2022-06-03 (×2): 1000 mg via INTRAVENOUS
  Filled 2022-06-02 (×2): qty 1

## 2022-06-02 MED ORDER — LIDOCAINE HCL (PF) 1 % IJ SOLN
10.0000 mL | Freq: Once | INTRAMUSCULAR | Status: DC
  Filled 2022-06-02: qty 10

## 2022-06-02 MED ORDER — LIDOCAINE HCL 1 % IJ SOLN
INTRAMUSCULAR | Status: AC
  Filled 2022-06-02: qty 20

## 2022-06-02 MED ORDER — ERTAPENEM SODIUM 1 G IJ SOLR
1.0000 g | Freq: Every day | INTRAMUSCULAR | Status: DC
  Filled 2022-06-02: qty 1

## 2022-06-02 MED ORDER — POTASSIUM CHLORIDE CRYS ER 20 MEQ PO TBCR
40.0000 meq | EXTENDED_RELEASE_TABLET | Freq: Every day | ORAL | Status: DC
  Administered 2022-06-02 – 2022-06-03 (×2): 40 meq via ORAL
  Filled 2022-06-02 (×2): qty 2

## 2022-06-02 MED ORDER — ALBUMIN HUMAN 25 % IV SOLN
100.0000 g | Freq: Once | INTRAVENOUS | Status: AC
  Administered 2022-06-03: 100 g via INTRAVENOUS
  Filled 2022-06-02: qty 400

## 2022-06-02 NOTE — Progress Notes (Signed)
Progress Note   Subjective  Hospital day #2 Chief Complaint: Decompensated cirrhosis and SBP  Nurse acted as interpreter today at time of my visit with the patient.  He tells me he has no abdominal pain at all after his paracentesis of 3.3 L earlier today.  In general he is feeling well and tolerating his diet.  No new complaints.  He does tell me he had 5 or 6 bowel movements with the lactulose yesterday.  Describes that it is "too sweet".   Objective   Vital signs in last 24 hours: Temp:  [99.1 F (37.3 C)-101.4 F (38.6 C)] 99.1 F (37.3 C) (05/23 0746) Pulse Rate:  [88-98] 88 (05/23 0746) Resp:  [16-18] 18 (05/23 0746) BP: (116-140)/(63-78) 116/63 (05/23 0746) SpO2:  [94 %-97 %] 96 % (05/23 0746) Last BM Date : 06/01/22 General: Hispanic male in NAD Heart:  Regular rate and rhythm; no murmurs Lungs: Respirations even and unlabored, lungs CTA bilaterally Abdomen:  Soft, nontender and nondistended. Normal bowel sounds. Psych:  Cooperative. Normal mood and affect.  Intake/Output from previous day: 05/22 0701 - 05/23 0700 In: -  Out: 1650 [Urine:1650]  Lab Results: Recent Labs    05/30/22 2340 06/01/22 0446 06/02/22 0640  WBC 2.1* 2.9* 3.2*  HGB 12.4* 9.5* 8.4*  HCT 36.0* 27.4* 23.7*  PLT 63* 26* 27*   BMET Recent Labs    05/30/22 2340 06/01/22 0446 06/02/22 0640  NA 126* 126* 127*  K 4.0 3.5 3.1*  CL 99 102 101  CO2 18* 20* 18*  GLUCOSE 142* 119* 89  BUN 5* 15 20  CREATININE 0.73 0.87 1.01  CALCIUM 7.9* 7.2* 7.9*   LFT Recent Labs    06/02/22 0640  PROT 5.3*  ALBUMIN 2.9*  AST 39  ALT 23  ALKPHOS 55  BILITOT 5.8*   PT/INR Recent Labs    05/30/22 2340 06/02/22 0640  LABPROT 20.8* 27.4*  INR 1.8* 2.5*    Studies/Results: IR Paracentesis  Result Date: 06/02/2022 INDICATION: Cirrhosis with recurrent ascites. Request for diagnostic and therapeutic paracentesis. EXAM: ULTRASOUND GUIDED RIGHT LOWER QUADRANT PARACENTESIS MEDICATIONS: 1%  plain lidocaine, 5 mL COMPLICATIONS: None immediate. PROCEDURE: Informed written consent was obtained from the patient after a discussion of the risks, benefits and alternatives to treatment. A timeout was performed prior to the initiation of the procedure. Initial ultrasound scanning demonstrates a large amount of ascites within the right lower abdominal quadrant. The right lower abdomen was prepped and draped in the usual sterile fashion. 1% lidocaine was used for local anesthesia. Following this, a 19 gauge, 7-cm, Yueh catheter was introduced. An ultrasound image was saved for documentation purposes. The paracentesis was performed. The catheter was removed and a dressing was applied. The patient tolerated the procedure well without immediate post procedural complication. FINDINGS: A total of approximately 3.3 L of clear yellow fluid was removed. Samples were sent to the laboratory as requested by the clinical team. IMPRESSION: Successful ultrasound-guided paracentesis yielding 3.3 liters of peritoneal fluid. Read by: Brayton El PA-C Electronically Signed   By: Olive Bass M.D.   On: 06/02/2022 11:18      Assessment / Plan:   Assessment: 1.  Decompensated alcoholic cirrhosis with sepsis secondary to SBP: MELD 3.0: 26 on 06/01/2022, MELD-NA: 27 and 06/01/2022, repeat paracentesis today with 3.3 L drawn off, cytology and labs pending 2.  Alcoholic hepatitis: Discriminant function greater than 32 but given active infection with SBP would hold off on steroids 3.  Pancytopenia:  Thought secondary to chronic alcohol abuse and bone marrow suppression and cirrhosis 4.  Alcohol abuse 5.  Severe protein calorie malnutrition  Plan: 1.  Continue Spironolactone 100 mg daily and Lasix 40 mg daily 2.  Less than 2 g sodium diet/less than 2 L fluid restriction 3.  Continue Lactulose, titrate to 3-4 bowel movements per day 4.  Continue CIWA protocol 5.  Continue Ceftriaxone  Overall prognosis is extremely poor.   We will continue to follow.   LOS: 2 days   Unk Lightning  06/02/2022, 2:30 PM

## 2022-06-02 NOTE — IPAL (Addendum)
  Interdisciplinary Goals of Care Family Meeting   Date carried out: 06/02/2022  Location of the meeting: Bedside  Member's involved: Physician, Bedside Registered Nurse, and Family Member or next of kin  Durable Power of Attorney or acting medical decision maker: None has been assigned  Discussion: We discussed goals of care for [employer] .  I explained to the him and his aunt who is present that he has a life limiting illness and that his MELD score portends a 90-day 20-30% mortality I have told him he absolutely needs to stop drinking  Code status:   Code Status: Full Code we will address CODE STATUS in the next several days  Disposition: Continue current acute care  Time spent for the meeting: 25    Rhetta Mura, MD  06/02/2022, 2:57 PM

## 2022-06-02 NOTE — TOC Initial Note (Signed)
Transition of Care [facility]) - Initial/Assessment Note    Patient Details  Name: Mathew [name] MRN: [id number] Date of Birth: September 14, 1980  Transition of Care [facility] CM/SW Contact:    Janae Bridgeman, RN Phone Number: 06/02/2022, 11:48 AM  Clinical Narrative:                 CM attempted to meet with the patient at the bedside but he was out of the room to have procedure at this time.  The patient's aunt, Byrd Hesselbach was in the room.  Bedside nurse, Lucy assisted to provide Spanish translation.  The aunt states that the patient lives with "his drinking friends at an unknown address/home with no running water nor electricity".  The patient's aunt states that the patient is an undocumented citizen from [place] and patient had declined staying with family who could provide safe housing.  The patient's aunt states that the patient does not work but continues to drive his brother's car for transportation.  The patient is active with [facility] and aunt plans to encourage the patient to follow up with the clinic when he is discharged.  Outpatient Substance abuse counseling provided in the AVS.  The patient is an active smoker - resources provided in the AVS.  CM will continue to follow the patient for needs but patient will likely discharge back home with family or address of patient's choosing.       Patient Goals and CMS Choice            Expected Discharge Plan and Services                                              Prior Living Arrangements/Services                       Activities of Daily Living      Permission Sought/Granted                  Emotional Assessment              Admission diagnosis:  Spontaneous bacterial peritonitis (HCC) [K65.2] Generalized abdominal pain [R10.84] SBP (spontaneous bacterial peritonitis) (HCC) [K65.2] Alcoholic cirrhosis of liver with ascites (HCC) [K70.31] Patient  Active Problem List   Diagnosis Date Noted   Spontaneous bacterial peritonitis (HCC) 05/31/2022   Elevated liver function tests 05/31/2022   Alcoholic hepatitis 05/31/2022   GERD (gastroesophageal reflux disease) 05/31/2022   Obesity (BMI 30-39.9) 05/31/2022   Decompensated hepatic cirrhosis (HCC) 03/01/2022   Anemia 02/19/2022   Transaminitis 02/19/2022   Uninsured 11/16/2021   Thrombocytopenia (HCC) 11/15/2021   Alcohol use disorder 09/02/2021   Hyponatremia 09/02/2021   Cirrhosis of liver (HCC) 09/01/2021   Pancytopenia (HCC)    Tobacco abuse    SBP (spontaneous bacterial peritonitis) (HCC) s/t Decompensated Hepatic Cirrhosis 06/17/2021   Umbilical hernia 11/26/2020   Sepsis (HCC) 04/02/2020   Vitamin D deficiency 02/03/2020   Edema due to hypoalbuminemia 12/20/2019   Alcoholic cirrhosis of liver with ascites (HCC) 03/13/2018   PCP:  [name], MD Pharmacy:   [facility] [phone] Ginette Otto, [place] - [address] AT [facility] [address] [facility] [address] [place] [address] Phone: [phone] Fax: [phone]  [place] [facility] [address] [place] [address] Phone: [phone] Fax: [phone]  [facility] [address] [place] [address] Phone: [phone] Fax: [phone]  [name] [address] [place] [address] Phone: [phone] Fax: [phone]     Social Determinants of Health (SDOH) Social History: SDOH Screenings   Food Insecurity: No Food Insecurity (02/19/2022)  Housing: Low Risk  (02/19/2022)  Transportation Needs: No Transportation Needs (02/19/2022)  Utilities: Not At Risk (02/19/2022)  Depression (PHQ2-9): High Risk (05/25/2022)  Tobacco Use: High Risk (06/02/2022)   SDOH Interventions:     Readmission Risk Interventions     No data to display

## 2022-06-02 NOTE — Consult Note (Signed)
Consultation Note Date: 06/02/2022   Patient Name: Mathew [name]  DOB: February 10, 1980  MRN: [id number]  Age / Sex: 42 y.o., male  PCP: [name], MD Referring Physician: Rhetta Mura, MD  Reason for Consultation: Establishing goals of care  HPI/Patient Profile: 42 y.o. male  with past medical history of decompensated liver cirrhosis, h/o SBP, alcohol abuse, umbilical hernia, tobacco abuse admitted on 05/30/2022 with abdominal pain.   Clinical Assessment and Goals of Care: Consult received and chart review completed. I had a discussion today with Madyx with assistance from Spanish tele Interpreter services. Raniel has understand from earlier conversations today about the severity of his disease. He shares that he cannot change where he is in his health now and we discussed doing our best to manage his health moving forward to make the most out of whatever time he has left. He alludes to not wanting to be prolonged in a poor state and wanting to go home. We discussed code status and he seems open to DNR status. He tells [place] that he would not want his family to see him hooked up to machines. However, he wishes to speak more with his brother, Alecia Lemming, about these decisions - he asks me to call and explain to Alecia Lemming. He is hoping that Alecia Lemming can help explain to his 72 year old daughter, Coralie Common, his position. He confirms that Alecia Lemming would be his Runner, broadcasting/film/video. He has other adult children but they are in [place] and his family is in process of helping to locate and contact them. I will reach out to Alecia Lemming to discuss further in the morning. I will follow up with Lars Mage again tomorrow.   All questions/concerns addressed. Emotional support provided.   Primary Decision Maker PATIENT    SUMMARY OF RECOMMENDATIONS   - Considering DNR - Ongoing discussion - will reach out to discuss with brother  Alecia Lemming - Time for outcomes  Code Status/Advance Care Planning: Full code   Symptom Management:  Per attending, GI.   Prognosis:  Overall prognosis poor with advanced cirrhosis.   Discharge Planning: To Be Determined      Primary Diagnoses: Present on Admission:  Spontaneous bacterial peritonitis (HCC)  Elevated liver function tests  Alcoholic cirrhosis of liver with ascites (HCC)  Alcohol use disorder  Alcoholic hepatitis  Hyponatremia  Pancytopenia (HCC)  Sepsis (HCC)  Umbilical hernia  GERD (gastroesophageal reflux disease)  Obesity (BMI 30-39.9)   I have reviewed the medical record, interviewed the patient and family, and examined the patient. The following aspects are pertinent.  Past Medical History:  Diagnosis Date   Acute metabolic encephalopathy 06/20/2021   Alcohol withdrawal seizure (HCC)    Cirrhosis (HCC)    ETOH abuse    Pneumonia 04/02/2020   SBP (spontaneous bacterial peritonitis) (HCC) 06/17/2021   Social History   Socioeconomic History   Marital status: Significant Other    Spouse name: Not on file   Number of children: Not on file   Years of education: Not on file  Highest education level: Not on file  Occupational History   Not on file  Tobacco Use   Smoking status: Every Day    Packs/day: .25    Types: Cigarettes    Start date: 08/11/2019   Smokeless tobacco: Never  Vaping Use   Vaping Use: Never used  Substance and Sexual Activity   Alcohol use: Yes    Alcohol/week: 2.0 standard drinks of alcohol    Types: 2 Cans of beer per week    Comment: 3-6 beer cans a week   Drug use: No   Sexual activity: Not Currently  Other Topics Concern   Not on file  Social History Narrative   Not on file   Social Determinants of Health   Financial Resource Strain: Not on file  Food Insecurity: No Food Insecurity (02/19/2022)   Hunger Vital Sign    Worried About Running Out of Food in the Last Year: Never true    Ran Out of Food in the Last  Year: Never true  Transportation Needs: No Transportation Needs (02/19/2022)   PRAPARE - Administrator, Civil Service (Medical): No    Lack of Transportation (Non-Medical): No  Physical Activity: Not on file  Stress: Not on file  Social Connections: Not on file   Family History  Adopted: Yes  Problem Relation Age of Onset   Cancer Neg Hx    Heart disease Neg Hx    Scheduled Meds:  furosemide  40 mg Oral Daily   lactulose  30 g Oral Q6H   lidocaine (PF)  10 mL Infiltration Once   nadolol  20 mg Oral Daily   pantoprazole  40 mg Oral Q0600   potassium chloride  40 mEq Oral Daily   sodium chloride flush  3 mL Intravenous Q12H   spironolactone  100 mg Oral Daily   Continuous Infusions:  ertapenem     PRN Meds:.acetaminophen, albuterol, HYDROmorphone (DILAUDID) injection, naLOXone (NARCAN)  injection, ondansetron **OR** ondansetron (ZOFRAN) IV, oxyCODONE No Known Allergies Review of Systems  Constitutional:  Positive for activity change.  Respiratory:  Negative for shortness of breath.   Gastrointestinal:  Positive for abdominal distention.    Physical Exam Vitals and nursing note reviewed.  Constitutional:      General: He is not in acute distress.    Appearance: He is ill-appearing.  Cardiovascular:     Rate and Rhythm: Normal rate.  Pulmonary:     Effort: No tachypnea, accessory muscle usage or respiratory distress.  Abdominal:     Palpations: Abdomen is soft.  Skin:    Coloration: Skin is jaundiced.  Neurological:     Mental Status: He is alert and oriented to person, place, and time.     Vital Signs: BP 116/63 (BP Location: Right Arm)   Pulse 88   Temp 99.1 F (37.3 C) (Oral)   Resp 18   Wt 83 kg   SpO2 96%   BMI 34.57 kg/m  Pain Scale: 0-10   Pain Score: 2    SpO2: SpO2: 96 % O2 Device:SpO2: 96 % O2 Flow Rate: .   IO: Intake/output summary:  Intake/Output Summary (Last 24 hours) at 06/02/2022 1440 Last data filed at 06/02/2022  0500 Gross per 24 hour  Intake --  Output 1450 ml  Net -1450 ml    LBM: Last BM Date : 06/01/22 Baseline Weight: Weight: 83 kg Most recent weight: Weight: 83 kg     Palliative Assessment/Data:     Time Total:  75 min  Greater than 50%  of this time was spent counseling and coordinating care related to the above assessment and plan.  Signed by: Yong Channel, NP Palliative Medicine Team Pager # [phone] (M-F 8a-5p) Team Phone # [phone] (Nights/Weekends)

## 2022-06-02 NOTE — Progress Notes (Addendum)
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 Ellin Mayhew has been fired by [name] to visit his primary care physician [name] 2/2 increasing abdominal, lower extremity, hand and feet swelling Underwent ultrasound-guided paracentesis 5/16-which turned out to be negative Came to emergency room with abdominal pain (known history of abdominal hernia) Referred for paracentesis underwent 6 L drainage--- lab called EDP as cultures = gram-negative rods Tmax 100, pulse 113 WBC 2.1 hemoglobin 12 platelet 63, sodium 126  Hospital-Problem based course  Sepsis on admission 2/2 SBP secondary to ESBL E. coli-changed Rocephin-->Invanz on 5/23 Appreciate of GI-albumin 100 g ordered today, repeat paracentesis shows 8000 cells/cubic millimeter and we will continue the treatment Gram stain shows no organisms [was on abx though]  Chronic ethanolism, alcoholic cirrhosis Alcoholic pancreatitis last drink circa 5/20 with 212 ounce beers-discriminant score 45--MEDL/NA=29=20% 3 month mortality Given active infection GI does not feel patient needs steroids at this time Continue lactulose 30 every 6 Hold nadolol for now looks like there may be some plans for EGD at some point? CIWA score is 0 we will observe overnight and if negative again tomorrow we will discontinue Ativan coverage  Prior SMV thrombosis Repeat Doppler ultrasound 5/23 negative for hepatic vein thrombosis with antegrade flow  Severe thrombocytopenia now 26--moderate pancytopenia Sepsis versus sepsis superimposed on chronic underlying cirrhosis Platelet has not rebounded as yet  Hypokalemia Replace potassium with 40 of  Kdur and rpt labs am  Macrocytic anemia with acute component Hemoccult at bedside was negative per nursing-no active bleeding but is nauseous Observe for trends  Umbilical hernia reducible  Hypervolemic hyponatremia Fluid restrict 1500 cc with strict salt limitation Expect should improve-education provided in Spanish   DVT prophylaxis: scd Code Status: Full Family Communication: None Disposition:  Status is: Inpatient Remains inpatient appropriate because:   Patient allegedly lives with friends as undocumented and is from [place] and drinks with them and has not had communication with family-Long discussion with him and his aunt at the bedside-he has a very poor prognosis doubt that he is a transplant candidate and we will get palliative care to come and see    Subjective:  Spiking temps overnight-I was not informed of his cultures growing ESBL Abdomen feels a lot better after paracentesis He is a little bit more hungry We had a long discussion as above  Objective: Vitals:   06/01/22 1955 06/02/22 0102 06/02/22 0530 06/02/22 0746  BP: (!) 140/78 138/73 122/66 116/63  Pulse: 94 98 91 88  Resp: 16 16 18 18   Temp: (!) 100.4 F (38 C) (!) 101.4 F (38.6 C) 99.6 F (37.6 C) 99.1 F (37.3 C)  TempSrc: Oral Oral Oral Oral  SpO2: 96% 97% 94% 96%  Weight:        Intake/Output Summary (Last 24 hours) at 06/02/2022 1502 Last data filed at 06/02/2022 0500 Gross per 24 hour  Intake --  Output 1450 ml  Net -1450 ml    Filed Weights   05/30/22 2322  Weight: 83 kg    Examination:  Looks about stated age mild icterus Abdominal distended but slightly less so Less tender Umbilical hernia reducible No asterixis-coherent   Data Reviewed: personally reviewed   CBC    Component Value Date/Time  WBC 3.2 (L) 06/02/2022 0640   RBC 2.34 (L) 06/02/2022 0640   HGB 8.4 (L) 06/02/2022 0640   HGB CANCELED 05/25/2022 1152   HCT 23.7 (L) 06/02/2022 0640   HCT CANCELED  05/25/2022 1152   PLT 27 (LL) 06/02/2022 0640   PLT CANCELED 05/25/2022 1152   MCV 101.3 (H) 06/02/2022 0640   MCV CANCELED 05/25/2022 1152   MCH 35.9 (H) 06/02/2022 0640   MCHC 35.4 06/02/2022 0640   RDW 12.5 06/02/2022 0640   RDW CANCELED 05/25/2022 1152   LYMPHSABS 0.5 (L) 05/30/2022 2340   LYMPHSABS CANCELED 05/25/2022 1152   MONOABS 0.1 05/30/2022 2340   EOSABS 0.0 05/30/2022 2340   EOSABS CANCELED 05/25/2022 1152   BASOSABS 0.0 05/30/2022 2340   BASOSABS CANCELED 05/25/2022 1152      Latest Ref Rng & Units 06/02/2022    6:40 AM 06/01/2022    4:46 AM 05/30/2022   11:40 PM  CMP  Glucose 70 - 99 mg/dL 89  829  562   BUN 6 - 20 mg/dL 20  15  5    Creatinine 0.61 - 1.24 mg/dL 1.30  8.65  7.84   Sodium 135 - 145 mmol/L 127  126  126   Potassium 3.5 - 5.1 mmol/L 3.1  3.5  4.0   Chloride 98 - 111 mmol/L 101  102  99   CO2 22 - 32 mmol/L 18  20  18    Calcium 8.9 - 10.3 mg/dL 7.9  7.2  7.9   Total Protein 6.5 - 8.1 g/dL 5.3  5.0  6.6   Total Bilirubin 0.3 - 1.2 mg/dL 5.8  5.2  5.3   Alkaline Phos 38 - 126 U/L 55  85  154   AST 15 - 41 U/L 39  63  116   ALT 0 - 44 U/L 23  32  48      Radiology Studies: US ABDOMEN LIMITED WITH LIVER DOPPLER  Result Date: 06/02/2022 CLINICAL DATA:  Decompensated hepatic cirrhosis EXAM: DUPLEX ULTRASOUND OF LIVER TECHNIQUE: Color and duplex Doppler ultrasound was performed to evaluate the hepatic in-flow and out-flow vessels. COMPARISON:  CT 02/28/2022 and previous FINDINGS: Liver: Echogenic parenchyma.  Nodular hepatic contour. No focal lesion, mass or intrahepatic biliary ductal dilatation. Main Portal Vein size: 1.7 cm Portal Vein Velocities (all hepatopetal): Main Prox:  16 cm/sec Main Mid: 35 cm/sec Main Dist:  34 cm/sec Right: 24 cm/sec Left: 30 cm/sec Hepatic Vein Velocities (all hepatofugal): Right:  22 cm/sec Middle:  33 cm/sec Left:  125 cm/sec IVC: Present and patent with normal respiratory phasicity. Limited static color flow images  demonstrate no signal in the inferior intrahepatic portion of the IVC on limited sagittal images, without confirmatory waveforms, axial or cine images. Hepatic Artery Velocity:  48 cm/sec Splenic Vein Velocity:  22 cm/sec Spleen: 14.7 cm x 7.5 cm x 7.2 cm with a total volume of 414 cm^3 (411 cm^3 is upper limit normal) Portal Vein Occlusion/Thrombus: Antegrade flow through the visualized portal venous system. Narrowing of the color signal in the mid segment of the main portal vein without significantly elevated velocities. Splenic Vein Occlusion/Thrombus: No Ascites: Moderate Varices: None None IMPRESSION: 1. Hepatic cirrhosis and sequelae of portal hypertension as above. 2. Limited assessment of  IVC patency. 3. Patent portal venous system with hepatopetal flow. 4. No evidence of hepatic vein thrombosis. 5. Moderate ascites. Electronically Signed   By: Corlis Leak M.D.   On: 06/02/2022 14:51   IR Paracentesis  Result Date:  06/02/2022 INDICATION: Cirrhosis with recurrent ascites. Request for diagnostic and therapeutic paracentesis. EXAM: ULTRASOUND GUIDED RIGHT LOWER QUADRANT PARACENTESIS MEDICATIONS: 1% plain lidocaine, 5 mL COMPLICATIONS: None immediate. PROCEDURE: Informed written consent was obtained from the patient after a discussion of the risks, benefits and alternatives to treatment. A timeout was performed prior to the initiation of the procedure. Initial ultrasound scanning demonstrates a large amount of ascites within the right lower abdominal quadrant. The right lower abdomen was prepped and draped in the usual sterile fashion. 1% lidocaine was used for local anesthesia. Following this, a 19 gauge, 7-cm, Yueh catheter was introduced. An ultrasound image was saved for documentation purposes. The paracentesis was performed. The catheter was removed and a dressing was applied. The patient tolerated the procedure well without immediate post procedural complication. FINDINGS: A total of approximately 3.3  L of clear yellow fluid was removed. Samples were sent to the laboratory as requested by the clinical team. IMPRESSION: Successful ultrasound-guided paracentesis yielding 3.3 liters of peritoneal fluid. Read by: Brayton El PA-C Electronically Signed   By: Olive Bass M.D.   On: 06/02/2022 11:18     Scheduled Meds:  furosemide  40 mg Oral Daily   lactulose  30 g Oral Q6H   lidocaine (PF)  10 mL Infiltration Once   nadolol  20 mg Oral Daily   pantoprazole  40 mg Oral Q0600   potassium chloride  40 mEq Oral Daily   sodium chloride flush  3 mL Intravenous Q12H   spironolactone  100 mg Oral Daily   Continuous Infusions:  ertapenem       LOS: 2 days   Time spent: 28  Rhetta Mura, MD [facility] To contact the attending provider between 7A-7P or the covering provider during after hours 7P-7A, please log into the web site [email or web address] and access using universal [place] password for that web site. If you do not have the password, please call the hospital operator.  06/02/2022, 3:02 PM

## 2022-06-03 ENCOUNTER — Encounter: Payer: Self-pay | Admitting: Family Medicine

## 2022-06-03 LAB — COMPREHENSIVE METABOLIC PANEL
ALT: 17 U/L (ref 0–44)
AST: 34 U/L (ref 15–41)
Albumin: 2.5 g/dL — ABNORMAL LOW (ref 3.5–5.0)
Alkaline Phosphatase: 54 U/L (ref 38–126)
Anion gap: 7 (ref 5–15)
BUN: 22 mg/dL — ABNORMAL HIGH (ref 6–20)
CO2: 18 mmol/L — ABNORMAL LOW (ref 22–32)
Calcium: 8 mg/dL — ABNORMAL LOW (ref 8.9–10.3)
Chloride: 103 mmol/L (ref 98–111)
Creatinine, Ser: 0.92 mg/dL (ref 0.61–1.24)
GFR, Estimated: >60 mL/min (ref >60)
Glucose, Bld: 102 mg/dL — ABNORMAL HIGH (ref 70–99)
Potassium: 3.1 mmol/L — ABNORMAL LOW (ref 3.5–5.1)
Sodium: 128 mmol/L — ABNORMAL LOW (ref 135–145)
Total Bilirubin: 5.9 mg/dL — ABNORMAL HIGH (ref 0.3–1.2)
Total Protein: 5 g/dL — ABNORMAL LOW (ref 6.5–8.1)

## 2022-06-03 LAB — CYTOLOGY - NON PAP

## 2022-06-03 LAB — CBC WITH DIFFERENTIAL/PLATELET
Abs Immature Granulocytes: 0.01 10*3/uL (ref 0.00–0.07)
Basophils Absolute: 0 10*3/uL (ref 0.0–0.1)
Basophils Relative: 0 %
Eosinophils Absolute: 0.1 10*3/uL (ref 0.0–0.5)
Eosinophils Relative: 2 %
HCT: 24.8 % — ABNORMAL LOW (ref 39.0–52.0)
Hemoglobin: 8.8 g/dL — ABNORMAL LOW (ref 13.0–17.0)
Immature Granulocytes: 0 %
Lymphocytes Relative: 17 %
Lymphs Abs: 0.5 10*3/uL — ABNORMAL LOW (ref 0.7–4.0)
MCH: 36.4 pg — ABNORMAL HIGH (ref 26.0–34.0)
MCHC: 35.5 g/dL (ref 30.0–36.0)
MCV: 102.5 fL — ABNORMAL HIGH (ref 80.0–100.0)
Monocytes Absolute: 0.5 10*3/uL (ref 0.1–1.0)
Monocytes Relative: 18 %
Neutro Abs: 1.9 10*3/uL (ref 1.7–7.7)
Neutrophils Relative %: 63 %
Platelets: 31 10*3/uL — ABNORMAL LOW (ref 150–400)
RBC: 2.42 MIL/uL — ABNORMAL LOW (ref 4.22–5.81)
RDW: 12.4 % (ref 11.5–15.5)
WBC: 3.1 10*3/uL — ABNORMAL LOW (ref 4.0–10.5)
nRBC: 0 % (ref 0.0–0.2)

## 2022-06-03 LAB — CULTURE, BLOOD (ROUTINE X 2)

## 2022-06-03 LAB — PROTIME-INR
INR: 2.4 — ABNORMAL HIGH (ref 0.8–1.2)
Prothrombin Time: 26.6 seconds — ABNORMAL HIGH (ref 11.4–15.2)

## 2022-06-03 LAB — CBC
HCT: 24.5 % — ABNORMAL LOW (ref 39.0–52.0)
Hemoglobin: 8.8 g/dL — ABNORMAL LOW (ref 13.0–17.0)
MCH: 36.7 pg — ABNORMAL HIGH (ref 26.0–34.0)
MCHC: 35.9 g/dL (ref 30.0–36.0)
MCV: 102.1 fL — ABNORMAL HIGH (ref 80.0–100.0)
Platelets: 38 10*3/uL — ABNORMAL LOW (ref 150–400)
RBC: 2.4 MIL/uL — ABNORMAL LOW (ref 4.22–5.81)
RDW: 12.4 % (ref 11.5–15.5)
WBC: 3.8 10*3/uL — ABNORMAL LOW (ref 4.0–10.5)
nRBC: 0 % (ref 0.0–0.2)

## 2022-06-03 LAB — CULTURE, BODY FLUID W GRAM STAIN -BOTTLE

## 2022-06-03 MED ORDER — POTASSIUM CHLORIDE CRYS ER 20 MEQ PO TBCR
40.0000 meq | EXTENDED_RELEASE_TABLET | Freq: Once | ORAL | Status: AC
  Administered 2022-06-03: 40 meq via ORAL
  Filled 2022-06-03: qty 2

## 2022-06-03 MED ORDER — SODIUM CHLORIDE 0.9 % IV SOLN
1.0000 g | Freq: Every day | INTRAVENOUS | Status: AC
  Administered 2022-06-04 – 2022-06-05 (×2): 1000 mg via INTRAVENOUS
  Filled 2022-06-03 (×2): qty 1

## 2022-06-03 MED ORDER — POTASSIUM CHLORIDE CRYS ER 20 MEQ PO TBCR
40.0000 meq | EXTENDED_RELEASE_TABLET | Freq: Two times a day (BID) | ORAL | Status: DC
  Administered 2022-06-03 – 2022-06-04 (×3): 40 meq via ORAL
  Filled 2022-06-03 (×3): qty 2

## 2022-06-03 NOTE — Progress Notes (Signed)
Ok to replace k 3.1 with x1 per Dr. Mahala Menghini.  Ulyses Southward, PharmD, BCIDP, AAHIVP, CPP Infectious Disease Pharmacist 06/03/2022 9:17 AM

## 2022-06-03 NOTE — Progress Notes (Signed)
Progress Note   Subjective  Patient feels about the same. Eating some. Pain controlled.    Objective   Vital signs in last 24 hours: Temp:  [98.3 F (36.8 C)-101.1 F (38.4 C)] 98.3 F (36.8 C) (05/24 0801) Pulse Rate:  [81-98] 81 (05/24 0801) Resp:  [16-18] 18 (05/24 0801) BP: (104-117)/(58-66) 117/66 (05/24 0801) SpO2:  [97 %-100 %] 98 % (05/24 0801) Weight:  [76.2 kg] 76.2 kg (05/24 0500) Last BM Date : 06/02/22 General:    male in NAD Neurologic:  Alert and oriented,  grossly normal neurologically. Psych:  Cooperative. Normal mood and affect.  Intake/Output from previous day: 05/23 0701 - 05/24 0700 In: 100 [IV Piggyback:100] Out: 100 [Urine:100] Intake/Output this shift: No intake/output data recorded.  Lab Results: Recent Labs    06/02/22 0640 06/03/22 0617 06/03/22 0933  WBC 3.2* 3.8* 3.1*  HGB 8.4* 8.8* 8.8*  HCT 23.7* 24.5* 24.8*  PLT 27* 38* 31*   BMET Recent Labs    06/01/22 0446 06/02/22 0640 06/03/22 0617  NA 126* 127* 128*  K 3.5 3.1* 3.1*  CL 102 101 103  CO2 20* 18* 18*  GLUCOSE 119* 89 102*  BUN 15 20 22*  CREATININE 0.87 1.01 0.92  CALCIUM 7.2* 7.9* 8.0*   LFT Recent Labs    06/03/22 0617  PROT 5.0*  ALBUMIN 2.5*  AST 34  ALT 17  ALKPHOS 54  BILITOT 5.9*   PT/INR Recent Labs    06/02/22 0640 06/03/22 0617  LABPROT 27.4* 26.6*  INR 2.5* 2.4*    Studies/Results: US ABDOMEN LIMITED WITH LIVER DOPPLER  Result Date: 06/02/2022 CLINICAL DATA:  Decompensated hepatic cirrhosis EXAM: DUPLEX ULTRASOUND OF LIVER TECHNIQUE: Color and duplex Doppler ultrasound was performed to evaluate the hepatic in-flow and out-flow vessels. COMPARISON:  CT 02/28/2022 and previous FINDINGS: Liver: Echogenic parenchyma.  Nodular hepatic contour. No focal lesion, mass or intrahepatic biliary ductal dilatation. Main Portal Vein size: 1.7 cm Portal Vein Velocities (all hepatopetal): Main Prox:  16 cm/sec Main Mid: 35 cm/sec Main Dist:  34  cm/sec Right: 24 cm/sec Left: 30 cm/sec Hepatic Vein Velocities (all hepatofugal): Right:  22 cm/sec Middle:  33 cm/sec Left:  125 cm/sec IVC: Present and patent with normal respiratory phasicity. Limited static color flow images demonstrate no signal in the inferior intrahepatic portion of the IVC on limited sagittal images, without confirmatory waveforms, axial or cine images. Hepatic Artery Velocity:  48 cm/sec Splenic Vein Velocity:  22 cm/sec Spleen: 14.7 cm x 7.5 cm x 7.2 cm with a total volume of 414 cm^3 (411 cm^3 is upper limit normal) Portal Vein Occlusion/Thrombus: Antegrade flow through the visualized portal venous system. Narrowing of the color signal in the mid segment of the main portal vein without significantly elevated velocities. Splenic Vein Occlusion/Thrombus: No Ascites: Moderate Varices: None None IMPRESSION: 1. Hepatic cirrhosis and sequelae of portal hypertension as above. 2. Limited assessment of  IVC patency. 3. Patent portal venous system with hepatopetal flow. 4. No evidence of hepatic vein thrombosis. 5. Moderate ascites. Electronically Signed   By: Corlis Leak M.D.   On: 06/02/2022 14:51   IR Paracentesis  Result Date: 06/02/2022 INDICATION: Cirrhosis with recurrent ascites. Request for diagnostic and therapeutic paracentesis. EXAM: ULTRASOUND GUIDED RIGHT LOWER QUADRANT PARACENTESIS MEDICATIONS: 1% plain lidocaine, 5 mL COMPLICATIONS: None immediate. PROCEDURE: Informed written consent was obtained from the patient after a discussion of the risks, benefits and alternatives to treatment. A timeout was performed prior to  the initiation of the procedure. Initial ultrasound scanning demonstrates a large amount of ascites within the right lower abdominal quadrant. The right lower abdomen was prepped and draped in the usual sterile fashion. 1% lidocaine was used for local anesthesia. Following this, a 19 gauge, 7-cm, Yueh catheter was introduced. An ultrasound image was saved for  documentation purposes. The paracentesis was performed. The catheter was removed and a dressing was applied. The patient tolerated the procedure well without immediate post procedural complication. FINDINGS: A total of approximately 3.3 L of clear yellow fluid was removed. Samples were sent to the laboratory as requested by the clinical team. IMPRESSION: Successful ultrasound-guided paracentesis yielding 3.3 liters of peritoneal fluid. Read by: Brayton El PA-C Electronically Signed   By: Olive Bass M.D.   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 On Invanz for SBP, continue course of IV antibiotics. Holding off on steroids for possible alcoholic hepatitis given his SBP. Receiving additional albumin infusion today (day 3). We have held his diuretics due to his renal function, hoping he does not go into renal failure. Holding off on beta blockade given SBP. He has severe thrombocytopenia and coagulopathy, at risk for GI bleeding. With thrombocytopenia holding off on elective EGD right now, not a good time to do elective banding if varices noted. Continue supportive measures for now. He will need SBP prophylaxis as outpatient upon discharge.   PLAN: - continue Invanz for SBP. As long as he is clinically improving I don't think we need to repeat a paracentesis. If he worsens he may need another paracentesis. Stable at this time - albumin infusion today (day 3) - monitor renal function closely, diuretics held - continue diet / nutrition, he is eating - continue lactulose - holding off on beta blockers and steroids right now - he will need outpatient SBP prophylaxis once out of the hospital - EGD at some point  for varices screening, may be outpatient - complete alcohol abstinence, monitor for withdrawal   We will follow peripherally for now over the long weekend but call if questions in the interim.  Harlin Rain, MD [facility]

## 2022-06-03 NOTE — Progress Notes (Signed)
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 Ellin Mayhew has been fired by [name] to visit his primary care physician [name] 2/2 increasing abdominal, lower extremity, hand and feet swelling Underwent ultrasound-guided paracentesis 5/16-which turned out to be negative Came to emergency room with abdominal pain (known history of abdominal hernia) Referred for paracentesis underwent 6 L drainage--- lab called EDP as cultures = gram-negative rods Tmax 100, pulse 113 WBC 2.1 hemoglobin 12 platelet 63, sodium 126  5/23 paracentesis shows 8000 cells/cubic millimeter   Hospital-Problem based course  Sepsis on admission 2/2 SBP 2/2 ESBL E. coli-changed Rocephin-->Invanz on 5/23 Per GI-albumin 100 g re-ordered 5/24, rpt Para 5/23  we will continue the treatment x 3 days minimum-stop date 5/25 Gram stain shows no organisms [was on abx though]  Chronic ethanolism, alcoholic cirrhosis Alcoholic pancreatitis last drink circa 5/20 with 212 ounce beers-discriminant score 45--MEDL/NA=29=20% 3 month mortality GI does not feel patient needs steroids/Beta bloker at this time---no plan for current EGD 2/2 pancytopenia Continue lactulose 30 every 6 with goal of several loose stools daily  Prior SMV thrombosis Repeat Doppler ultrasound 5/23 negative for hepatic vein thrombosis with antegrade flow  Severe thrombocytopenia now 26--moderate pancytopenia Sepsis versus sepsis superimposed on chronic underlying cirrhosis Platelet improving slowly  Hypokalemia Replace potassium with 40 of Kdur and rpt labs am  Macrocytic anemia with acute component Hemoccult at bedside was  negative per nursing-no active bleeding but is nauseous Observe for trends  Umbilical hernia reducible Unlikely surgical candidate  Hypervolemic hyponatremia Fluid restrict 1500 cc with strict salt limitation but can liberalize fluid restriction likely once sodium approaches 129-130 Improving very slowly   DVT prophylaxis: scd Code Status: Full-considering DNR Family Communication: See below Disposition:  Status is: Inpatient Remains inpatient appropriate because:   Patient allegedly lives with friends as undocumented and is from [place] and drinks with them and has not had communication with family- I followed up our conversation today and spoke with Alecia Lemming the patient's brother on the telephone as the patient's sister-in-law had his number I mention to Alecia Lemming that although the patient appears well, his liver is quite diseased and he has a pretty serious infection in his abdomen and I explained this in layman's terms Given that he is an active drinker, is undocumented he is not likely a good candidate for transplant-and mentioned Alcoholics Anonymous but also have asked that the patient consider his goals of care His brother is coming to the hospital, I provided a letter for his 3 children who live in [place] to be excused from their job so that they can come and visit with him as I feel they need to make this decision as family I expect we will know in the next several days if he wishes to go home with hospice versus not  I have asked that the chaplain come and sign advanced care paperwork with him so that we can solidify Alecia Lemming as the healthcare surrogate    Subjective:  Looks fair Some abdominal pain-mainly localized at umbilical hernia Eating marginally-no bleeding-  Objective: Vitals:   06/03/22 0508 06/03/22 0801 06/03/22 1146 06/03/22 1606  BP: (!) 104/59 117/66 114/61 115/70  Pulse: 84 81 77 84  Resp: 16 18 18  18  Temp: 98.7 F (37.1 C) 98.3 F (36.8 C) 98.6 F (37 C)  98.4 F (36.9 C)  TempSrc: Oral Oral Oral Oral  SpO2: 98% 98% 98% 98%  Weight:        Intake/Output Summary (Last 24 hours) at 06/03/2022 1655 Last data filed at 06/03/2022 1610 Gross per 24 hour  Intake 100 ml  Output 100 ml  Net 0 ml    Filed Weights   05/30/22 2322 06/03/22 0500  Weight: 83 kg 76.2 kg    Examination:  Looks about stated age mild icterus Abdominal distended looks more distended than post paracentesis yesterday Less tender Umbilical hernia reducible No asterixis-coherent   Data Reviewed: personally reviewed   CBC    Component Value Date/Time   WBC 3.1 (L) 06/03/2022 0933   RBC 2.42 (L) 06/03/2022 0933   HGB 8.8 (L) 06/03/2022 0933   HGB CANCELED 05/25/2022 1152   HCT 24.8 (L) 06/03/2022 0933   HCT CANCELED 05/25/2022 1152   PLT 31 (L) 06/03/2022 0933   PLT CANCELED 05/25/2022 1152   MCV 102.5 (H) 06/03/2022 0933   MCV CANCELED 05/25/2022 1152   MCH 36.4 (H) 06/03/2022 0933   MCHC 35.5 06/03/2022 0933   RDW 12.4 06/03/2022 0933   RDW CANCELED 05/25/2022 1152   LYMPHSABS 0.5 (L) 06/03/2022 0933   LYMPHSABS CANCELED 05/25/2022 1152   MONOABS 0.5 06/03/2022 0933   EOSABS 0.1 06/03/2022 0933   EOSABS CANCELED 05/25/2022 1152   BASOSABS 0.0 06/03/2022 0933   BASOSABS CANCELED 05/25/2022 1152      Latest Ref Rng & Units 06/03/2022    6:17 AM 06/02/2022    6:40 AM 06/01/2022    4:46 AM  CMP  Glucose 70 - 99 mg/dL 960  89  454   BUN 6 - 20 mg/dL 22  20  15    Creatinine 0.61 - 1.24 mg/dL 0.98  1.19  1.47   Sodium 135 - 145 mmol/L 128  127  126   Potassium 3.5 - 5.1 mmol/L 3.1  3.1  3.5   Chloride 98 - 111 mmol/L 103  101  102   CO2 22 - 32 mmol/L 18  18  20    Calcium 8.9 - 10.3 mg/dL 8.0  7.9  7.2   Total Protein 6.5 - 8.1 g/dL 5.0  5.3  5.0   Total Bilirubin 0.3 - 1.2 mg/dL 5.9  5.8  5.2   Alkaline Phos 38 - 126 U/L 54  55  85   AST 15 - 41 U/L 34  39  63   ALT 0 - 44 U/L 17  23  32      Radiology Studies: US ABDOMEN LIMITED WITH  LIVER DOPPLER  Result Date: 06/02/2022 CLINICAL DATA:  Decompensated hepatic cirrhosis EXAM: DUPLEX ULTRASOUND OF LIVER TECHNIQUE: Color and duplex Doppler ultrasound was performed to evaluate the hepatic in-flow and out-flow vessels. COMPARISON:  CT 02/28/2022 and previous FINDINGS: Liver: Echogenic parenchyma.  Nodular hepatic contour. No focal lesion, mass or intrahepatic biliary ductal dilatation. Main Portal Vein size: 1.7 cm Portal Vein Velocities (all hepatopetal): Main Prox:  16 cm/sec Main Mid: 35 cm/sec Main Dist:  34 cm/sec Right: 24 cm/sec Left: 30 cm/sec Hepatic Vein Velocities (all hepatofugal): Right:  22 cm/sec Middle:  33 cm/sec Left:  125 cm/sec IVC: Present and patent with normal respiratory phasicity. Limited static color flow images demonstrate no signal in the inferior intrahepatic portion of the IVC on limited sagittal images, without confirmatory waveforms, axial or cine  images. Hepatic Artery Velocity:  48 cm/sec Splenic Vein Velocity:  22 cm/sec Spleen: 14.7 cm x 7.5 cm x 7.2 cm with a total volume of 414 cm^3 (411 cm^3 is upper limit normal) Portal Vein Occlusion/Thrombus: Antegrade flow through the visualized portal venous system. Narrowing of the color signal in the mid segment of the main portal vein without significantly elevated velocities. Splenic Vein Occlusion/Thrombus: No Ascites: Moderate Varices: None None IMPRESSION: 1. Hepatic cirrhosis and sequelae of portal hypertension as above. 2. Limited assessment of  IVC patency. 3. Patent portal venous system with hepatopetal flow. 4. No evidence of hepatic vein thrombosis. 5. Moderate ascites. Electronically Signed   By: Corlis Leak M.D.   On: 06/02/2022 14:51   IR Paracentesis  Result Date: 06/02/2022 INDICATION: Cirrhosis with recurrent ascites. Request for diagnostic and therapeutic paracentesis. EXAM: ULTRASOUND GUIDED RIGHT LOWER QUADRANT PARACENTESIS MEDICATIONS: 1% plain lidocaine, 5 mL COMPLICATIONS: None immediate.  PROCEDURE: Informed written consent was obtained from the patient after a discussion of the risks, benefits and alternatives to treatment. A timeout was performed prior to the initiation of the procedure. Initial ultrasound scanning demonstrates a large amount of ascites within the right lower abdominal quadrant. The right lower abdomen was prepped and draped in the usual sterile fashion. 1% lidocaine was used for local anesthesia. Following this, a 19 gauge, 7-cm, Yueh catheter was introduced. An ultrasound image was saved for documentation purposes. The paracentesis was performed. The catheter was removed and a dressing was applied. The patient tolerated the procedure well without immediate post procedural complication. FINDINGS: A total of approximately 3.3 L of clear yellow fluid was removed. Samples were sent to the laboratory as requested by the clinical team. IMPRESSION: Successful ultrasound-guided paracentesis yielding 3.3 liters of peritoneal fluid. Read by: Brayton El PA-C Electronically Signed   By: Olive Bass M.D.   On: 06/02/2022 11:18     Scheduled Meds:  lactulose  30 g Oral Q6H   lidocaine (PF)  10 mL Infiltration Once   pantoprazole  40 mg Oral Q0600   potassium chloride  40 mEq Oral Daily   sodium chloride flush  3 mL Intravenous Q12H   Continuous Infusions:  ertapenem 1,000 mg (06/03/22 0908)     LOS: 3 days   Time spent: 89  Rhetta Mura, MD [facility] To contact the attending provider between 7A-7P or the covering provider during after hours 7P-7A, please log into the web site [email or web address] and access using universal [place] password for that web site. If you do not have the password, please call the hospital operator.  06/03/2022, 4:55 PM

## 2022-06-03 NOTE — Progress Notes (Signed)
   06/03/22 1728  Spiritual Encounters  Type of Visit Initial  Conversation partners present during encounter Nurse  Reason for visit Advance directives  OnCall Visit Yes   06/03/22 17:23 - 2W called inquiring about AD requested at 15:10 wanting it done "STAT", it is now 17:23 unable to provide notary and/or witnesses - only on-call chaplain available. Per nurse patient will be discharged by Monday (as this is Friday night and no weekend...) Advised that Chaplain can come and discuss with patient and patient can take home when discharged and mail it back. Caller stated doctor wants it "STAT" "as in immediately" and "what do we do when someone is dying?". The notary persons have let for the day as well as the day chaplains, and volunteer services unable to provide witnesses after 4pm. Due to the late request unable to fulfill at this time.      Leaving request on consult list for follow-up

## 2022-06-03 NOTE — Progress Notes (Signed)
Palliative:  HPI: 42 y.o. male  with past medical history of decompensated liver cirrhosis, h/o SBP, alcohol abuse, umbilical hernia, tobacco abuse admitted on 05/30/2022 with abdominal pain.   I met today again with Gattman. He is sleeping but awakens to talk. I am still trying to reach his brother, Alecia Lemming, but have been unable to reach him by phone. Custer shares that his brother works and is very busy. Cyress reports that he feels better today and he has no further questions or concerns about his condition.   I discussed with nursing who has sent message on behalf of Delmo's adult daughters in [place] to request their employer to allow them time off to visit their father. I touched base with daughter, Rosey Bath, who believe she received the note but has been working and unable to review or provide to her employer yet. Rosey Bath has no further questions or concerns but reports that she and her sister are available as needed for updates in Izeah's condition.   I received update that Dr. Mahala Menghini was able to connect with brother, Alecia Lemming, and explain Saxon's situation. I will follow up with Lars Mage tomorrow to further review decisions, goals of care, advance directive.   Exam: Alert, oriented. No distress. Breathing regular, unlabored. Abd soft. Moves all extremities. Generalized fatigue and poor appetite.   Plan: - Ongoing goals of care discussions.   40 min  Yong Channel, NP Palliative Medicine Team Pager [phone] (Please see [email or web address] for schedule) Team Phone [phone]    Greater than 50%  of this time was spent counseling and coordinating care related to the above assessment and plan

## 2022-06-04 LAB — COMPREHENSIVE METABOLIC PANEL
ALT: 18 U/L (ref 0–44)
AST: 35 U/L (ref 15–41)
Albumin: 2.9 g/dL — ABNORMAL LOW (ref 3.5–5.0)
Alkaline Phosphatase: 48 U/L (ref 38–126)
Anion gap: 5 (ref 5–15)
BUN: 21 mg/dL — ABNORMAL HIGH (ref 6–20)
CO2: 16 mmol/L — ABNORMAL LOW (ref 22–32)
Calcium: 8.3 mg/dL — ABNORMAL LOW (ref 8.9–10.3)
Chloride: 107 mmol/L (ref 98–111)
Creatinine, Ser: 0.77 mg/dL (ref 0.61–1.24)
GFR, Estimated: >60 mL/min (ref >60)
Glucose, Bld: 122 mg/dL — ABNORMAL HIGH (ref 70–99)
Potassium: 4.2 mmol/L (ref 3.5–5.1)
Sodium: 128 mmol/L — ABNORMAL LOW (ref 135–145)
Total Bilirubin: 3.8 mg/dL — ABNORMAL HIGH (ref 0.3–1.2)
Total Protein: 4.9 g/dL — ABNORMAL LOW (ref 6.5–8.1)

## 2022-06-04 LAB — CBC
HCT: 23.2 % — ABNORMAL LOW (ref 39.0–52.0)
Hemoglobin: 8.1 g/dL — ABNORMAL LOW (ref 13.0–17.0)
MCH: 35.8 pg — ABNORMAL HIGH (ref 26.0–34.0)
MCHC: 34.9 g/dL (ref 30.0–36.0)
MCV: 102.7 fL — ABNORMAL HIGH (ref 80.0–100.0)
Platelets: 30 10*3/uL — ABNORMAL LOW (ref 150–400)
RBC: 2.26 MIL/uL — ABNORMAL LOW (ref 4.22–5.81)
RDW: 12.5 % (ref 11.5–15.5)
WBC: 2.1 10*3/uL — ABNORMAL LOW (ref 4.0–10.5)
nRBC: 0 % (ref 0.0–0.2)

## 2022-06-04 LAB — PROTIME-INR
INR: 2.5 — ABNORMAL HIGH (ref 0.8–1.2)
Prothrombin Time: 27.4 seconds — ABNORMAL HIGH (ref 11.4–15.2)

## 2022-06-04 LAB — CULTURE, BLOOD (ROUTINE X 2)

## 2022-06-04 LAB — CULTURE, BODY FLUID W GRAM STAIN -BOTTLE

## 2022-06-04 NOTE — Progress Notes (Signed)
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 Ellin Mayhew has been fired by [name] to visit his primary care physician [name] 2/2 increasing abdominal, lower extremity, hand and feet swelling Underwent ultrasound-guided paracentesis 5/16-which turned out to be negative Came to emergency room with abdominal pain (known history of abdominal hernia) Referred for paracentesis underwent 6 L drainage--- lab called EDP as cultures = gram-negative rods Tmax 100, pulse 113 WBC 2.1 hemoglobin 12 platelet 63, sodium 126  5/23 GI consulted--Paracentesis shows 8000 cells/cubic millimeter, palliative care consulted additionally  Hospital-Problem based course  Sepsis on admission 2/2 SBP 2/2 ESBL E. coli-changed Rocephin-->Invanz on 5/23 Per GI-albumin 100 g re-ordered 5/24, rpt Para 5/23  we will continue the treatment x 3 days minimum-stop date 5/25 Gram stain shows no organisms [was on abx though]  Chronic ethanolism, alcoholic cirrhosis Alcoholic pancreatitis last drink circa 5/20 with 212 ounce beers-discriminant score 45--MEDL/NA=29=20% 3 month mortality GI does not feel patient needs steroids/Beta bloker at this time---no plan for current EGD 2/2 pancytopenia Lactulose 30 every 6  Metabolic acidosis secondary potentially to infection mild hyponatremia as below Monitor and observe Hold bicarb for now treat underlying ESBL E. coli growing in paracentesis culture  Prior SMV thrombosis Repeat Doppler ultrasound 5/23 negative for hepatic vein thrombosis with antegrade flow  Severe thrombocytopenia now 26--moderate pancytopenia Sepsis versus sepsis superimposed on  chronic underlying cirrhosis Platelet stable in the low 30 range  Hypokalemia Improved, continue potassium 40 twice daily  Macrocytic anemia with acute component Hemoccult at bedside was negative per nursing-no active bleeding but is nauseous Observe for trends  Umbilical hernia reducible Unlikely surgical candidate  Hypervolemic hyponatremia Fluid restrict 1500 cc with strict salt limitation but can liberalize fluid restriction likely once sodium approaches 129-130 Improving very slowly   DVT prophylaxis: scd Code Status: Full-considering DNR Family Communication: See below Disposition:  Status is: Inpatient Remains inpatient appropriate because:   Complete several more days of IV Invanz watch electrolytes Disposition is complicated-he was previously living with his aunt then went to live with friends where he was drinking and now has to figure out housing We have a pending goals of care discussion with palliative care as well and his brother as per palliative cares recommendations    Subjective:  Feels stronger more hungry asking for lunch no chest pain No high-grade fever feels less swollen in his abdomen Note still has low-grade temps of 100  Objective: Vitals:   06/03/22 1606 06/03/22 2034 06/04/22 0344 06/04/22 0838  BP: 115/70 (!) 107/58 114/65 105/60  Pulse: 84 90 91 93  Resp: 18 18 15 18   Temp: 98.4 F (36.9 C) 98.6 F (37 C) 100.3 F (37.9 C) 100 F (37.8 C)  TempSrc: Oral  Oral   SpO2: 98% 100% 99% 99%  Weight:        Intake/Output Summary (Last 24 hours) at 06/04/2022 1652 Last data filed at 06/04/2022 1412 Gross per 24 hour  Intake 480 ml  Output --  Net 480 ml    Filed Weights   05/30/22 2322 06/03/22 0500  Weight: 83 kg 76.2 kg    Examination:  Looks about stated age mild icterus Abdominal less distended still tender with umbilical hernia No asterixis Chest is  clear No lower extremity edema no flap   Data Reviewed: personally  reviewed   CBC    Component Value Date/Time   WBC 2.1 (L) 06/04/2022 0423   RBC 2.26 (L) 06/04/2022 0423   HGB 8.1 (L) 06/04/2022 0423   HGB CANCELED 05/25/2022 1152   HCT 23.2 (L) 06/04/2022 0423   HCT CANCELED 05/25/2022 1152   PLT 30 (L) 06/04/2022 0423   PLT CANCELED 05/25/2022 1152   MCV 102.7 (H) 06/04/2022 0423   MCV CANCELED 05/25/2022 1152   MCH 35.8 (H) 06/04/2022 0423   MCHC 34.9 06/04/2022 0423   RDW 12.5 06/04/2022 0423   RDW CANCELED 05/25/2022 1152   LYMPHSABS 0.5 (L) 06/03/2022 0933   LYMPHSABS CANCELED 05/25/2022 1152   MONOABS 0.5 06/03/2022 0933   EOSABS 0.1 06/03/2022 0933   EOSABS CANCELED 05/25/2022 1152   BASOSABS 0.0 06/03/2022 0933   BASOSABS CANCELED 05/25/2022 1152      Latest Ref Rng & Units 06/04/2022    4:23 AM 06/03/2022    6:17 AM 06/02/2022    6:40 AM  CMP  Glucose 70 - 99 mg/dL 161  096  89   BUN 6 - 20 mg/dL 21  22  20    Creatinine 0.61 - 1.24 mg/dL 0.45  4.09  8.11   Sodium 135 - 145 mmol/L 128  128  127   Potassium 3.5 - 5.1 mmol/L 4.2  3.1  3.1   Chloride 98 - 111 mmol/L 107  103  101   CO2 22 - 32 mmol/L 16  18  18    Calcium 8.9 - 10.3 mg/dL 8.3  8.0  7.9   Total Protein 6.5 - 8.1 g/dL 4.9  5.0  5.3   Total Bilirubin 0.3 - 1.2 mg/dL 3.8  5.9  5.8   Alkaline Phos 38 - 126 U/L 48  54  55   AST 15 - 41 U/L 35  34  39   ALT 0 - 44 U/L 18  17  23       Radiology Studies: No results found.   Scheduled Meds:  lactulose  30 g Oral Q6H   lidocaine (PF)  10 mL Infiltration Once   pantoprazole  40 mg Oral Q0600   potassium chloride  40 mEq Oral BID   sodium chloride flush  3 mL Intravenous Q12H   Continuous Infusions:  ertapenem 1,000 mg (06/04/22 1014)     LOS: 4 days   Time spent: 25  Rhetta Mura, MD [facility] To contact the attending provider between 7A-7P or the covering provider during after hours 7P-7A, please log into the web site [email or web address] and access using universal [place] password for that  web site. If you do not have the password, please call the hospital operator.  06/04/2022, 4:52 PM

## 2022-06-04 NOTE — Progress Notes (Signed)
Palliative:  HPI: 42 y.o. male  with past medical history of decompensated liver cirrhosis, h/o SBP, alcohol abuse, umbilical hernia, tobacco abuse admitted on 05/30/2022 with abdominal pain related to SBP.   I met today with [place] utilizing Spanish tele-Interpreter service. Emmery shares that he is feeling better today. He reports feeling well and improved appetite. He appears to be more awake and interactive today. I provided a copy of [place] Advance Directive. I explained the importance of the document to clarify HCPOA (he continues to name brother Alecia Lemming as Runner, broadcasting/film/video) and to clarify his wishes for certain interventions if his health worsens. Lemonte shares initially that his brother Alecia Lemming would make these decisions but I encouraged that Alecia Lemming should make his own decisions while he is able to and Alecia Lemming will be responsible for carrying out Brigham's wishes when needed. Bennie expresses understanding. He would like to review document with his brother Alecia Lemming before completing. Jacquel also gives his permission for [place] to share information with his daughters Rosey Bath and Elmyra Ricks.   I attempted again to call Alecia Lemming to discuss goals of care and document left with [place]. I was unable to reach Alecia Lemming again today.   I have asked my colleague to follow up tomorrow as Dallin shares that Alecia Lemming is sometimes off work on Sundays. I am hopefully that we can connect with Alecia Lemming so he can support Bernon in making decisions.   All questions/concerns addressed. Emotional support provided.   Exam: Alert, oriented. No distress. Breathing regular, unlabored. Abd soft. Jaundice. Moves all extremities.   Plan: - Ongoing goals of care conversations.  - Hopefully we can connect with patient and brother to make some decisions tomorrow. I have asked my colleague Lamarr Lulas, NP to repeat attempts tomorrow.   40 min  Yong Channel, NP Palliative Medicine Team Pager [phone] (Please see [email or web address] for schedule) Team  Phone [phone]    Greater than 50%  of this time was spent counseling and coordinating care related to the above assessment and plan

## 2022-06-05 LAB — CBC WITH DIFFERENTIAL/PLATELET
Abs Immature Granulocytes: 0.01 10*3/uL (ref 0.00–0.07)
Basophils Absolute: 0 10*3/uL (ref 0.0–0.1)
Basophils Relative: 1 %
Eosinophils Absolute: 0.1 10*3/uL (ref 0.0–0.5)
Eosinophils Relative: 4 %
HCT: 24.1 % — ABNORMAL LOW (ref 39.0–52.0)
Hemoglobin: 8.3 g/dL — ABNORMAL LOW (ref 13.0–17.0)
Immature Granulocytes: 1 %
Lymphocytes Relative: 32 %
Lymphs Abs: 0.5 10*3/uL — ABNORMAL LOW (ref 0.7–4.0)
MCH: 35.3 pg — ABNORMAL HIGH (ref 26.0–34.0)
MCHC: 34.4 g/dL (ref 30.0–36.0)
MCV: 102.6 fL — ABNORMAL HIGH (ref 80.0–100.0)
Monocytes Absolute: 0.4 10*3/uL (ref 0.1–1.0)
Monocytes Relative: 23 %
Neutro Abs: 0.7 10*3/uL — ABNORMAL LOW (ref 1.7–7.7)
Neutrophils Relative %: 39 %
Platelets: 31 10*3/uL — ABNORMAL LOW (ref 150–400)
RBC: 2.35 MIL/uL — ABNORMAL LOW (ref 4.22–5.81)
RDW: 12.6 % (ref 11.5–15.5)
WBC: 1.7 10*3/uL — ABNORMAL LOW (ref 4.0–10.5)
nRBC: 0 % (ref 0.0–0.2)

## 2022-06-05 LAB — COMPREHENSIVE METABOLIC PANEL
ALT: 22 U/L (ref 0–44)
AST: 51 U/L — ABNORMAL HIGH (ref 15–41)
Albumin: 2.8 g/dL — ABNORMAL LOW (ref 3.5–5.0)
Alkaline Phosphatase: 60 U/L (ref 38–126)
Anion gap: 4 — ABNORMAL LOW (ref 5–15)
BUN: 12 mg/dL (ref 6–20)
CO2: 16 mmol/L — ABNORMAL LOW (ref 22–32)
Calcium: 8.2 mg/dL — ABNORMAL LOW (ref 8.9–10.3)
Chloride: 111 mmol/L (ref 98–111)
Creatinine, Ser: 0.63 mg/dL (ref 0.61–1.24)
GFR, Estimated: >60 mL/min (ref >60)
Glucose, Bld: 96 mg/dL (ref 70–99)
Potassium: 4.5 mmol/L (ref 3.5–5.1)
Sodium: 131 mmol/L — ABNORMAL LOW (ref 135–145)
Total Bilirubin: 3 mg/dL — ABNORMAL HIGH (ref 0.3–1.2)
Total Protein: 5 g/dL — ABNORMAL LOW (ref 6.5–8.1)

## 2022-06-05 LAB — PROTIME-INR
INR: 2.3 — ABNORMAL HIGH (ref 0.8–1.2)
Prothrombin Time: 25.7 seconds — ABNORMAL HIGH (ref 11.4–15.2)

## 2022-06-05 LAB — CULTURE, BODY FLUID W GRAM STAIN -BOTTLE: Culture: NO GROWTH

## 2022-06-05 MED ORDER — SODIUM BICARBONATE 650 MG PO TABS
650.0000 mg | ORAL_TABLET | Freq: Two times a day (BID) | ORAL | Status: DC
  Administered 2022-06-05 – 2022-06-06 (×3): 650 mg via ORAL
  Filled 2022-06-05 (×3): qty 1

## 2022-06-05 NOTE — Progress Notes (Signed)
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 Ellin Mayhew has been fired by [name] to visit his primary care physician [name] 2/2 increasing abdominal, lower extremity, hand and feet swelling Underwent ultrasound-guided paracentesis 5/16-which turned out to be negative Came to emergency room with abdominal pain (known history of abdominal hernia) Referred for paracentesis underwent 6 L drainage--- lab called EDP as cultures = gram-negative rods Tmax 100, pulse 113 WBC 2.1 hemoglobin 12 platelet 63, sodium 126  5/23 GI consulted--Paracentesis shows 8000 cells/cubic millimeter, palliative care consulted additionally  Hospital-Problem based course  Sepsis on admission 2/2 SBP 2/2 ESBL E. coli-changed Rocephin-->Invanz on 5/23 Per GI-albumin 100 g re-ordered 5/24, rpt Para 5/23  we will continue the treatment x 3 days minimum-stopped 5/25 Gram stain shows no organisms [was on abx though]  Chronic ethanolism, alcoholic cirrhosis pancreatitis last drink circa 5/20 with 2x 12 ounce beers-discriminant score 45--MEDL/NA=29=20% 3 month mortality GI does not feel patient needs steroids/Beta bloker at this time---no plan for current EGD 2/2 pancytopenia Lactulose 30 every 6  Metabolic acidosis secondary potentially to infection mild hyponatremia as below Monitor and observe Start bicarb 650 bid  Prior SMV thrombosis Repeat Doppler ultrasound 5/23 negative for hepatic vein thrombosis with antegrade flow  Severe thrombocytopenia-slowly improving and stable with labs in the 30s Sepsis superimposed on chronic underlying cirrhosis Platelet stable in the low 30  range Only supportive management at this time  Hypokalemia Improved, stopped replacement 5/26  Macrocytic anemia with acute component Hemoccult at bedside was negative per nursing-no active bleeding but is nauseous Observe for trends  Umbilical hernia reducible Unlikely surgical candidate  Hypervolemic hyponatremia Fluid restrict 1500 cc with strict salt limitation but can liberalize fluid restriction likely once sodium approaches 129-130 Improved some--cont fluid restrict   DVT prophylaxis: scd Code Status: DNR/DNI Family Communication: See below Disposition:  Status is: Inpatient Remains inpatient appropriate because:   No family currently present at bedside-see palliative note Probable discharge in 24 to 48 hours with stability to his aunts house and I have told the patient to discuss with his aunt via the interpreter that he is stabilized and we need to make disposition plans  He has a very overall guarded prognosis    Subjective:  Early satiety but overall feels a little stronger-he does look better No chest pain no fever  Objective: Vitals:   06/04/22 2003 06/05/22 0012 06/05/22 0455 06/05/22 0921  BP: 123/68 117/71 101/72 112/63  Pulse: 81 84 81 77  Resp: 16 16 16 18   Temp: 98.4 F (36.9 C) 99.2 F (37.3 C) 98.8 F (37.1 C) 98 F (36.7 C)  TempSrc:      SpO2: 100% 100% 98% 100%  Weight:        Intake/Output Summary (Last 24 hours) at 06/05/2022 1453 Last data filed at 06/05/2022 0920 Gross per 24 hour  Intake 1174.44 ml  Output --  Net 1174.44 ml    Filed Weights   05/30/22 2322 06/03/22 0500  Weight: 83 kg 76.2 kg    Examination:  Mild icterus which seems to be decreasing slightly Abdominal less distended still tender with umbilical hernia No asterixis Chest is clear No lower extremity edema no flap   Data Reviewed: personally reviewed  CBC    Component Value Date/Time   WBC 1.7 (L) 06/05/2022 0619   RBC 2.35 (L) 06/05/2022 0619    HGB 8.3 (L) 06/05/2022 0619   HGB CANCELED 05/25/2022 1152   HCT 24.1 (L) 06/05/2022 0619   HCT CANCELED 05/25/2022 1152   PLT 31 (L) 06/05/2022 0619   PLT CANCELED 05/25/2022 1152   MCV 102.6 (H) 06/05/2022 0619   MCV CANCELED 05/25/2022 1152   MCH 35.3 (H) 06/05/2022 0619   MCHC 34.4 06/05/2022 0619   RDW 12.6 06/05/2022 0619   RDW CANCELED 05/25/2022 1152   LYMPHSABS 0.5 (L) 06/05/2022 0619   LYMPHSABS CANCELED 05/25/2022 1152   MONOABS 0.4 06/05/2022 0619   EOSABS 0.1 06/05/2022 0619   EOSABS CANCELED 05/25/2022 1152   BASOSABS 0.0 06/05/2022 0619   BASOSABS CANCELED 05/25/2022 1152      Latest Ref Rng & Units 06/05/2022    6:19 AM 06/04/2022    4:23 AM 06/03/2022    6:17 AM  CMP  Glucose 70 - 99 mg/dL 96  086  578   BUN 6 - 20 mg/dL 12  21  22    Creatinine 0.61 - 1.24 mg/dL 4.69  6.29  5.28   Sodium 135 - 145 mmol/L 131  128  128   Potassium 3.5 - 5.1 mmol/L 4.5  4.2  3.1   Chloride 98 - 111 mmol/L 111  107  103   CO2 22 - 32 mmol/L 16  16  18    Calcium 8.9 - 10.3 mg/dL 8.2  8.3  8.0   Total Protein 6.5 - 8.1 g/dL 5.0  4.9  5.0   Total Bilirubin 0.3 - 1.2 mg/dL 3.0  3.8  5.9   Alkaline Phos 38 - 126 U/L 60  48  54   AST 15 - 41 U/L 51  35  34   ALT 0 - 44 U/L 22  18  17       Radiology Studies: No results found.   Scheduled Meds:  lactulose  30 g Oral Q6H   lidocaine (PF)  10 mL Infiltration Once   pantoprazole  40 mg Oral Q0600   sodium bicarbonate  650 mg Oral BID   sodium chloride flush  3 mL Intravenous Q12H   Continuous Infusions:     LOS: 5 days   Time spent: 25  Rhetta Mura, MD [facility] To contact the attending provider between 7A-7P or the covering provider during after hours 7P-7A, please log into the web site [email or web address] and access using universal [place] password for that web site. If you do not have the password, please call the hospital operator.  06/05/2022, 2:53 PM

## 2022-06-05 NOTE — Progress Notes (Addendum)
Palliative Medicine Inpatient Follow Up Note HPI: 42 y.o. male  with past medical history of decompensated liver cirrhosis, h/o SBP, alcohol abuse, umbilical hernia, tobacco abuse admitted on 05/30/2022 with abdominal pain related to SBP.   Today's Discussion 06/05/2022  *Please note that this is a verbal dictation therefore any spelling or grammatical errors are due to the "[employer]agon [employer] One" system interpretation.  Chart reviewed inclusive of vital signs, progress notes, laboratory results, and diagnostic images.   I met this morning with Mathew [name] and communicated with him through the use of [employer] - interpretor # [id number]. We reviewed Mathew [name]'s present health state in the setting of his alcohol abuse. We discussed the secondary effects of this, most notably his cirrhosis. We reviewed the cascade of occurrences/decline which tend to happen with cirrhosis.  I shared that at this time Mathew [name] appears to be stable. He shares with me the things in his life that provide meaning. He notes he has four children though three of them live in [place] which is difficult for him. He is not presently married. He shares a close relationship to his brother, Mathew [name] who he works with. Mathew [name] places gutters while Mathew [name] works on the "light side" of things.   I further reviewed the idea of resuscitation. Mathew [name] knows that placing him through CPR/intubation would not result in fruitful outcomes. He is though wanting to defer to Mathew [name] to formally make this decision. I have called Mathew [name] with the interpretation services and left my call back information.   Mathew [name] endorses hope for discharge in the oncoming days.  ___________________________________________________________________ Addendum:  I have spoke to patients brother, Mathew [name] through [employer] Mathew [name] [phone]. We discussed resuscitation status and patients brother is in agreement with DNAR/DNI. He agrees to my placing this order at the time of our  conversation. He is understanding of his brothers poor overall health in the setting of his cirrhosis. We discussed precautions for him moving forward. We reviewed patients life and the reality that unless he can stop drinking and get a liver transplant that we will continue to see ongoing issues.  Patients brother has three of his children coming up to see him for further conversations.   Plan for OP Palliative support on discharge.  Questions and concerns addressed/Palliative Support Provided.   Additional Time: 25 minutes  Objective Assessment: Vital Signs Vitals:   06/05/22 0012 06/05/22 0455  BP: 117/71 101/72  Pulse: 84 81  Resp: 16 16  Temp: 99.2 F (37.3 C) 98.8 F (37.1 C)  SpO2: 100% 98%    Intake/Output Summary (Last 24 hours) at 06/05/2022 0647 Last data filed at 06/05/2022 0533 Gross per 24 hour  Intake 1277.44 ml  Output --  Net 1277.44 ml   Last Weight  Most recent update: 06/03/2022  5:33 AM    Weight  76.2 kg (167 lb 15.9 oz)            Gen: Middle aged Hispanic male in NAD HEENT: moist mucous membranes, sclera jaundiced CV: Regular rate and rhythm  PULM: clear to auscultation bilaterally  ABD: Slight distention Integumentary:Jaundiced EXT: No edema  Neuro: Alert and oriented x3   SUMMARY OF RECOMMENDATIONS   DNAR/DNI  Patient aware of his disease burden and long term effects  Ongoing PMT support while inpatient  Billing based on MDM: High ______________________________________________________________________________________ Mathew [name] [place] Palliative Medicine Team Team Cell Phone: [phone] Please utilize secure chat with additional questions, if there is no response within 30 minutes please call  the above phone number  Palliative Medicine Team providers are available by phone from 7am to 7pm daily and can be reached through the team cell phone.  Should this patient require assistance outside of these hours, please call the  patient's attending physician.

## 2022-06-06 LAB — CULTURE, BODY FLUID W GRAM STAIN -BOTTLE

## 2022-06-06 MED ORDER — SODIUM BICARBONATE 650 MG PO TABS
650.0000 mg | ORAL_TABLET | Freq: Two times a day (BID) | ORAL | 0 refills | Status: DC
  Filled 2022-06-06: qty 60, 30d supply, fill #0

## 2022-06-06 MED ORDER — FUROSEMIDE 20 MG PO TABS
20.0000 mg | ORAL_TABLET | Freq: Every day | ORAL | 3 refills | Status: DC
  Filled 2022-06-06: qty 30, 30d supply, fill #0

## 2022-06-06 MED ORDER — PANTOPRAZOLE SODIUM 40 MG PO TBEC
40.0000 mg | DELAYED_RELEASE_TABLET | Freq: Every day | ORAL | 1 refills | Status: DC
  Filled 2022-06-06: qty 90, 90d supply, fill #0

## 2022-06-06 MED ORDER — CIPROFLOXACIN HCL 250 MG PO TABS
250.0000 mg | ORAL_TABLET | Freq: Every day | ORAL | 3 refills | Status: DC
  Filled 2022-06-06: qty 90, 90d supply, fill #0

## 2022-06-06 MED ORDER — LACTULOSE 10 GM/15ML PO SOLN
30.0000 g | Freq: Four times a day (QID) | ORAL | 1 refills | Status: DC
  Filled 2022-06-06: qty 5400, 30d supply, fill #0

## 2022-06-06 MED ORDER — SPIRONOLACTONE 50 MG PO TABS
50.0000 mg | ORAL_TABLET | Freq: Every day | ORAL | 3 refills | Status: DC
  Filled 2022-06-06: qty 30, 30d supply, fill #0

## 2022-06-06 MED ORDER — CIPROFLOXACIN HCL 500 MG PO TABS
250.0000 mg | ORAL_TABLET | Freq: Every day | ORAL | 11 refills | Status: DC
  Filled 2022-06-06: qty 15, 30d supply, fill #0

## 2022-06-06 MED ORDER — PANTOPRAZOLE SODIUM 40 MG PO TBEC
40.0000 mg | DELAYED_RELEASE_TABLET | Freq: Every day | ORAL | 1 refills | Status: DC
  Filled 2022-06-06: qty 30, 30d supply, fill #0

## 2022-06-06 MED ORDER — LACTULOSE 10 GM/15ML PO SOLN
30.0000 g | Freq: Four times a day (QID) | ORAL | 1 refills | Status: DC
  Filled 2022-06-06: qty 473, 3d supply, fill #0

## 2022-06-06 NOTE — Progress Notes (Addendum)
   Palliative Medicine Inpatient Follow Up Note HPI: 42 y.o. male  with past medical history of decompensated liver cirrhosis, h/o SBP, alcohol abuse, umbilical hernia, tobacco abuse admitted on 05/30/2022 with abdominal pain related to SBP.   Today's Discussion 06/06/2022  *Please note that this is a verbal dictation therefore any spelling or grammatical errors are due to the "[employer]agon [employer] One" system interpretation.  Chart reviewed inclusive of vital signs, progress notes, laboratory results, and diagnostic images.   I spoke to patients night RN, Mathew [name] who shares no concerns at this time.   I met this morning with Mathew [name] who communicated that he is feeling "100%". He endorses the desire to discharge home today and requests that the nursing staff let him know the time so that he may shower.   Mathew [name] and I reviewed the conversation that was held with his brother, Mathew [name] yesterday. Mathew [name] is in agreement with the plan from a Palliative perspective at this time.  Discussed OP Palliative support on discharge.   Objective Assessment: Vital Signs Vitals:   06/06/22 0010 06/06/22 0426  BP: 108/62 106/63  Pulse: 79 81  Resp: 16 16  Temp: 99.8 F (37.7 C) 98 F (36.7 C)  SpO2: 100% 99%    Intake/Output Summary (Last 24 hours) at 06/06/2022 1610 Last data filed at 06/05/2022 2019 Gross per 24 hour  Intake 377 ml  Output 250 ml  Net 127 ml    Last Weight  Most recent update: 06/03/2022  5:33 AM    Weight  76.2 kg (167 lb 15.9 oz)            Gen: Middle aged Hispanic male in NAD HEENT: moist mucous membranes, sclera jaundiced CV: Regular rate and rhythm  PULM: clear to auscultation bilaterally  ABD: Slight distention Integumentary:Jaundiced EXT: No edema  Neuro: Alert and oriented x3   SUMMARY OF RECOMMENDATIONS   DNAR/DNI  Patient aware of his disease burden and long term effects  TOC - will request OP Palliative support on discharge - patient is not insured therefore it  would need to be a charity case appreciate TOC navigating this  Billing based on MDM: Moderate  ______________________________________________________________________________________ Mathew [name] [place] Palliative Medicine Team Team Cell Phone: [phone] Please utilize secure chat with additional questions, if there is no response within 30 minutes please call the above phone number  Palliative Medicine Team providers are available by phone from 7am to 7pm daily and can be reached through the team cell phone.  Should this patient require assistance outside of these hours, please call the patient's attending physician.

## 2022-06-06 NOTE — Discharge Summary (Addendum)
Physician Discharge Summary  Detavius Bloxsom [name] UEA:[id number] DOB: Nov 23, 1980 DOA: 05/30/2022  PCP: [name], MD  Admit date: 05/30/2022 Discharge date: 06/06/2022  Time spent: 70 minutes  Recommendations for Outpatient Follow-up:  High risk for poor prognosis--MELD score portends 30 to 40% 16-month mortality Discharging on lifelong ciprofloxacin, Lasix Aldactone 50: 20 ratio as well as bicarb Requires labs in [facility]-CC [name] Requires hepatology outpatient follow-up as per coordination with Dr. Adela Lank if patient is willing/able-I will CC Dr. Adela Lank Dr. Fredia Sorrow of IR was CCed earlier I will CC current IR physician to ensure patient is on the radar for paracentesis as needed Palliative care/[facility] to follow patient in the outpatient as he is DNR  Discharge Diagnoses:  MAIN problem for hospitalization   ESBL E. coli bacterial peritonitis Chronic ethanolism MELD score 20 to 30% 31-month mortality Metabolic acidosis secondary to infection/other issues Prior SMV thrombosis Pancytopenia on admission slightly improved Microcytic anemia Reducible umbilical hernia not a surgical candidate Hypervolemic hyponatremia  Please see below for itemized issues addressed in HOpsital- refer to other progress notes for clarity if needed  Discharge Condition: Guarded  Diet recommendation: Heart healthy salt restricted less than 2 g  Filed Weights   05/30/22 2322 06/03/22 0500  Weight: 83 kg 76.2 kg    History of present illness:  42 year old Hispanic male Known EtOH abuse disorder with underlying chronic thrombocytopenia, chronic SBP, previous hepatic encephalopathy on lactulose/Xifaxan, prior SMV thrombosis Prior UGIB 03/2023 + grade 2 varices Dr. Ellin Mayhew has been fired by [name] to visit his primary care physician [name] 2/2 increasing abdominal, lower extremity, hand and feet swelling Underwent  ultrasound-guided paracentesis 5/16-which turned out to be negative Came to emergency room with abdominal pain (known history of abdominal hernia) Referred for paracentesis underwent 6 L drainage--- lab called EDP as cultures = gram-negative rods Tmax 100, pulse 113 WBC 2.1 hemoglobin 12 platelet 63, sodium 126   5/23 GI consulted--Paracentesis shows 8000 cells/cubic millimeter, palliative care consulted additionally   Hospital Course:  Sepsis on admission 2/2 SBP 2/2 ESBL E. coli-changed Rocephin-->Invanz on 5/23 Per GI-albumin 100 g re-ordered 5/24, rpt Para 5/23 showed no further growth on the paracentesis fluid  we will continue the treatment x 3 days minimum-stopped 5/25-he needs prophylaxis at discharge and  I discussed with ID physician Dr. Algis Liming on 5/27 at time of discharge and we elected on using ciprofloxacin-evidence on the use of antibiotics in this population is relatively poor but given his current situation with high risk for lack of access, lack of follow-up I feel it is imperative to place him on SBP prophylaxis   Chronic ethanolism, alcoholic cirrhosis pancreatitis last drink circa 5/20 with 2x 12 ounce beers-discriminant score 45--MEDL/NA=29=20% 3 month mortality GI does not feel patient needs steroids/Beta bloker at this time---no plan for current EGD 2/2 pancytopenia Lactulose 30 every 6 and I have reinforced in the presence of translator/nursing staff who speaks Spanish that the patient ABSOLUTELY has to use the lactulose to prevent confusion and debility   Metabolic acidosis secondary potentially to infection mild hyponatremia as below Monitor and observe Start bicarb 650 bid-needs labs in a week   Prior SMV thrombosis Repeat Doppler ultrasound 5/23 negative for hepatic vein thrombosis with antegrade flow   Severe thrombocytopenia-slowly improving and stable with labs in the 30s Sepsis superimposed on chronic underlying cirrhosis Platelet stable in the low 30  range Only supportive management at this time his prognosis overall  is guarded   Hypokalemia Improved, stopped replacement 5/26   Macrocytic anemia with acute component Hemoccult at bedside was negative per nursing-no active bleeding but is nauseous Observe for trends   Umbilical hernia reducible Unlikely surgical candidate   Hypervolemic hyponatremia Fluid restrict 1500 cc with strict salt limitation but can liberalize fluid restriction likely once sodium approaches 129-130 Improved some--cont fluid restrict  I had a long discussion with the patient at time of discharge with his brother Alecia Lemming present on the phone-I went over the plan of care in a stepwise fashion with nursing staff who speaks Spanish His plan given that he is homeless is to go right now in his brother's car and find a house-I have cautioned that this is probably not a good idea and he probably needs to stay with his aunt for short period of time until he can stabilize little bit more Patient was given ample opportunity to ask questions and I have sent all of his meds to the [facility] pharmacy and he will follow-up in the outpatient setting with [name] his PCP who I will CC on this note  Discharge Exam: Vitals:   06/06/22 0426 06/06/22 0742  BP: 106/63 (!) 101/59  Pulse: 81 80  Resp: 16 16  Temp: 98 F (36.7 C) 99.1 F (37.3 C)  SpO2: 99% 97%    Subj on day of d/c   Awake looks better overall seems fine Eating drinking no issues has not walked in the hallway I have counseled him to do so no chest pain  General Exam on discharge  EOMI NCAT no focal deficit no icterus no pallor no rales no rhonchi Chest is clear no wheeze Abdomen obese nontender umbilical hernia seems fine No lower extremity edema ROM is intact  Discharge Instructions   Discharge Instructions     Diet - low sodium heart healthy   Complete by: As directed    Discharge instructions   Complete by: As directed    Make sure that you  take all the medications that have been prescribed to you including the fluid pills as well as the new antibiotic called norfloxacin once daily which you have to take for the rest of your life to prevent your abdominal fluid from becoming infected It would be a really good idea that you follow-up with primary care as well as with gastroenterology for your cirrhosis-as we mentioned I do not think we can cure you but we can probably ensure that you do not get sicker You will need to have several bowel movements daily which is why we have prescribed the lactulose-I know that this is not always a pleasant option but you need to do this as we discussed 2D conjugate your liver and make sure that you do not become confused because of toxins building up in your body from your liver failure Please make sure that you adhere to less than 2 g salt diet and do not overdo water-I would suggest less than 2 L of water a day I have communicated with the gastroenterologist the radiologist who placed a drain in your belly in addition to the social workers who will help you get your medications and we will call them into our [facility] health pharmacy so you can take them home with you-again please follow the medication instructions carefully We will try to get you set up with a primary care physician so that labs can be done in about a week or so and you should hear  from someone so keep your phone on you It would be in your best interest to absolutely stop drinking-otherwise if you drink that is a death sentence and you WILL DIE-please be careful with fluid intake-do not take nonsteroidal medications (ibuprofen Naprosyn) unless you are given the clearance by eye doctor   Increase activity slowly   Complete by: As directed       Allergies as of 06/06/2022   No Known Allergies      Medication List     STOP taking these medications    acetaminophen 325 MG tablet Commonly known as: TYLENOL   FeroSul 325 (65 FE) MG  tablet Generic drug: ferrous sulfate   folic acid 1 MG tablet Commonly known as: FOLVITE   multivitamin with minerals Tabs tablet   propranolol 10 MG tablet Commonly known as: INDERAL   thiamine 100 MG tablet Commonly known as: VITAMIN B1       TAKE these medications    ciprofloxacin 500 MG tablet Commonly known as: CIPRO Take 0.5 tablets (250 mg total) by mouth daily with breakfast.   furosemide 20 MG tablet Commonly known as: LASIX Take 1 tablet (20 mg total) by mouth daily. What changed:  medication strength how much to take   lactulose 10 GM/15ML solution Commonly known as: CHRONULAC Take 45 mLs (30 g total) by mouth every 6 (six) hours.   pantoprazole 40 MG tablet Commonly known as: PROTONIX Take 1 tablet (40 mg total) by mouth daily at 6 (six) AM.   sodium bicarbonate 650 MG tablet Take 1 tablet (650 mg total) by mouth 2 (two) times daily.   spironolactone 50 MG tablet Commonly known as: ALDACTONE Take 1 tablet (50 mg total) by mouth daily. What changed:  medication strength how much to take       No Known Allergies  Follow-up Information     [facility] Follow up.   Why: [facility] will contact you for the first home visit. Contact information: [address] [employer] [place] [address]        [facility]. Call.   Why: Call to make an appointment as soon as possible after discharge. Contact information: [address] [place] [address] [phone]                 The results of significant diagnostics from this hospitalization (including imaging, microbiology, ancillary and laboratory) are listed below for reference.    Significant Diagnostic Studies: US ABDOMEN LIMITED WITH LIVER DOPPLER  Result Date: 06/02/2022 CLINICAL DATA:  Decompensated hepatic cirrhosis EXAM: DUPLEX ULTRASOUND OF LIVER TECHNIQUE: Color and duplex Doppler ultrasound was  performed to evaluate the hepatic in-flow and out-flow vessels. COMPARISON:  CT 02/28/2022 and previous FINDINGS: Liver: Echogenic parenchyma.  Nodular hepatic contour. No focal lesion, mass or intrahepatic biliary ductal dilatation. Main Portal Vein size: 1.7 cm Portal Vein Velocities (all hepatopetal): Main Prox:  16 cm/sec Main Mid: 35 cm/sec Main Dist:  34 cm/sec Right: 24 cm/sec Left: 30 cm/sec Hepatic Vein Velocities (all hepatofugal): Right:  22 cm/sec Middle:  33 cm/sec Left:  125 cm/sec IVC: Present and patent with normal respiratory phasicity. Limited static color flow images demonstrate no signal in the inferior intrahepatic portion of the IVC on limited sagittal images, without confirmatory waveforms, axial or cine images. Hepatic Artery Velocity:  48 cm/sec Splenic Vein Velocity:  22 cm/sec Spleen: 14.7 cm x 7.5 cm x 7.2 cm with a total volume of 414  cm^3 (411 cm^3 is upper limit normal) Portal Vein Occlusion/Thrombus: Antegrade flow through the visualized portal venous system. Narrowing of the color signal in the mid segment of the main portal vein without significantly elevated velocities. Splenic Vein Occlusion/Thrombus: No Ascites: Moderate Varices: None None IMPRESSION: 1. Hepatic cirrhosis and sequelae of portal hypertension as above. 2. Limited assessment of  IVC patency. 3. Patent portal venous system with hepatopetal flow. 4. No evidence of hepatic vein thrombosis. 5. Moderate ascites. Electronically Signed   By: Corlis Leak M.D.   On: 06/02/2022 14:51   IR Paracentesis  Result Date: 06/02/2022 INDICATION: Cirrhosis with recurrent ascites. Request for diagnostic and therapeutic paracentesis. EXAM: ULTRASOUND GUIDED RIGHT LOWER QUADRANT PARACENTESIS MEDICATIONS: 1% plain lidocaine, 5 mL COMPLICATIONS: None immediate. PROCEDURE: Informed written consent was obtained from the patient after a discussion of the risks, benefits and alternatives to treatment. A timeout was performed prior to the  initiation of the procedure. Initial ultrasound scanning demonstrates a large amount of ascites within the right lower abdominal quadrant. The right lower abdomen was prepped and draped in the usual sterile fashion. 1% lidocaine was used for local anesthesia. Following this, a 19 gauge, 7-cm, Yueh catheter was introduced. An ultrasound image was saved for documentation purposes. The paracentesis was performed. The catheter was removed and a dressing was applied. The patient tolerated the procedure well without immediate post procedural complication. FINDINGS: A total of approximately 3.3 L of clear yellow fluid was removed. Samples were sent to the laboratory as requested by the clinical team. IMPRESSION: Successful ultrasound-guided paracentesis yielding 3.3 liters of peritoneal fluid. Read by: Brayton El PA-C Electronically Signed   By: Olive Bass M.D.   On: 06/02/2022 11:18   IR Paracentesis  Result Date: 05/31/2022 INDICATION: Patient with a history of cirrhosis with recurrent ascites. Interventional Radiology asked to perform a therapeutic paracentesis. EXAM: ULTRASOUND GUIDED PARACENTESIS MEDICATIONS: 1% lidocaine 10 ml . COMPLICATIONS: None immediate. PROCEDURE: Informed written consent was obtained from the patient after a discussion of the risks, benefits and alternatives to treatment. A timeout was performed prior to the initiation of the procedure. Initial ultrasound scanning demonstrates a large amount of ascites within the right lower abdominal quadrant. The right lower abdomen was prepped and draped in the usual sterile fashion. 1% lidocaine was used for local anesthesia. Following this, a 19 gauge, 7-cm, Yueh catheter was introduced. An ultrasound image was saved for documentation purposes. The paracentesis was performed. The catheter was removed and a dressing was applied. The patient tolerated the procedure well without immediate post procedural complication. Patient received  post-procedure intravenous albumin; see nursing notes for details. FINDINGS: A total of approximately 6 L of clear yellow fluid was removed. IMPRESSION: Successful ultrasound-guided paracentesis yielding 6 liters of peritoneal fluid. Procedure performed by: Alwyn Ren, NP PLAN: The patient has required >/=2 paracenteses in a 30 day period and a formal evaluation by the [facility] Interventional Radiology Portal Hypertension Clinic has been arranged. Electronically Signed   By: Gilmer Mor D.O.   On: 05/31/2022 17:04   US Paracentesis  Result Date: 05/26/2022 INDICATION: Patient with history of alcoholic cirrhosis, spontaneous bacterial peritonitis, recurrent ascites. Request received for diagnostic and therapeutic paracentesis up to 5 liters. EXAM: ULTRASOUND GUIDED DIAGNOSTIC AND THERAPEUTIC PARACENTESIS MEDICATIONS: 10 ml 1% lidocaine COMPLICATIONS: None immediate. PROCEDURE: Informed written consent was obtained from the patient via interpreter after a discussion of the risks, benefits and alternatives to treatment. A timeout was performed prior to the initiation of the procedure.  Initial ultrasound scanning demonstrates a large amount of ascites within the left lower abdominal quadrant. The left lower abdomen was prepped and draped in the usual sterile fashion. 1% lidocaine was used for local anesthesia. Following this, a 19 gauge, 10-cm, Yueh catheter was introduced. An ultrasound image was saved for documentation purposes. The paracentesis was performed. The catheter was removed and a dressing was applied. The patient tolerated the procedure well without immediate post procedural complication. FINDINGS: A total of approximately 4.8 liters of yellow fluid was removed. Samples were sent to the laboratory as requested by the clinical team. IMPRESSION: Successful ultrasound-guided diagnostic and therapeutic paracentesis yielding 4.8 liters of peritoneal fluid. PLAN: Candidacy for formal evaluation by  the [facility] Interventional Radiology Portal Hypertension Clinic will be assessed. Performed by: Artemio Aly Electronically Signed   By: Irish Lack M.D.   On: 05/26/2022 15:25    Microbiology: Recent Results (from the past 240 hour(s))  Culture, blood (routine x 2)     Status: None   Collection Time: 05/31/22 12:18 AM   Specimen: BLOOD LEFT HAND  Result Value Ref Range Status   Specimen Description BLOOD LEFT HAND  Final   Special Requests   Final    BOTTLES DRAWN AEROBIC AND ANAEROBIC Blood Culture results may not be optimal due to an inadequate volume of blood received in culture bottles   Culture   Final    NO GROWTH 5 DAYS Performed at [facility], [address], [place], [place] [address]    Report Status 06/05/2022 FINAL  Final  Culture, blood (routine x 2)     Status: None   Collection Time: 05/31/22  2:48 AM   Specimen: BLOOD LEFT HAND  Result Value Ref Range Status   Specimen Description BLOOD LEFT HAND  Final   Special Requests   Final    BOTTLES DRAWN AEROBIC AND ANAEROBIC Blood Culture adequate volume   Culture   Final    NO GROWTH 5 DAYS Performed at [facility], [address] [place], [place] [address]    Report Status 06/05/2022 FINAL  Final  Culture, body fluid w Gram Stain-bottle     Status: Abnormal   Collection Time: 05/31/22  3:21 AM   Specimen: Fluid  Result Value Ref Range Status   Specimen Description FLUID  Final   Special Requests   Final    PERITONEAL FL Performed at [facility], [address], [place], [place] [address]    Culture (A)  Final    ESCHERICHIA COLI CRITICAL RESULT CALLED TO, READ BACK BY AND VERIFIED WITH: DR. MATT  1435 [id number] FCP Confirmed Extended Spectrum Beta-Lactamase Producer (ESBL).  In bloodstream infections from ESBL organisms, carbapenems are preferred over piperacillin/tazobactam. They are shown to have a lower risk of mortality.    Report Status 06/02/2022 FINAL  Final   Organism ID,  Bacteria ESCHERICHIA COLI  Final      Susceptibility   Escherichia coli - MIC*    AMPICILLIN >=32 RESISTANT Resistant     CEFEPIME 16 RESISTANT Resistant     CEFTAZIDIME RESISTANT Resistant     CIPROFLOXACIN >=4 RESISTANT Resistant     GENTAMICIN <=1 SENSITIVE Sensitive     IMIPENEM <=0.25 SENSITIVE Sensitive     TRIMETH/SULFA >=320 RESISTANT Resistant     AMPICILLIN/SULBACTAM 8 SENSITIVE Sensitive     PIP/TAZO <=4 SENSITIVE Sensitive     * ESCHERICHIA COLI  Culture, body fluid w Gram Stain-bottle  Status: None (Preliminary result)   Collection Time: 06/02/22 10:48 AM   Specimen: Peritoneal Washings  Result Value Ref Range Status   Specimen Description PERITONEAL  Final   Special Requests NONE  Final   Culture   Final    NO GROWTH 4 DAYS Performed at [facility], [address], [place], [place] [address]    Report Status PENDING  Incomplete  Gram stain     Status: None   Collection Time: 06/02/22 10:48 AM   Specimen: Peritoneal Washings  Result Value Ref Range Status   Specimen Description PERITONEAL  Final   Special Requests NONE  Final   Gram Stain   Final    FEW WBC PRESENT,BOTH PMN AND MONONUCLEAR NO ORGANISMS SEEN Performed at [facility], [address], [place], [place] [address]    Report Status 06/02/2022 FINAL  Final     Labs: Basic Metabolic Panel: Recent Labs  Lab 06/01/22 0446 06/02/22 0640 06/03/22 0617 06/04/22 0423 06/05/22 0619  NA 126* 127* 128* 128* 131*  K 3.5 3.1* 3.1* 4.2 4.5  CL 102 101 103 107 111  CO2 20* 18* 18* 16* 16*  GLUCOSE 119* 89 102* 122* 96  BUN 15 20 22* 21* 12  CREATININE 0.87 1.01 0.92 0.77 0.63  CALCIUM 7.2* 7.9* 8.0* 8.3* 8.2*  MG 1.8  --   --   --   --   PHOS 3.1  --   --   --   --    Liver Function Tests: Recent Labs  Lab 06/01/22 0446 06/02/22 0640 06/03/22 0617 06/04/22 0423 06/05/22 0619  AST 63* 39 34 35 51*  ALT 32 23 17 18 22   ALKPHOS 85 55 54 48 60  BILITOT 5.2* 5.8* 5.9* 3.8* 3.0*   PROT 5.0* 5.3* 5.0* 4.9* 5.0*  ALBUMIN 1.9* 2.9* 2.5* 2.9* 2.8*   Recent Labs  Lab 05/30/22 2340  LIPASE 39   Recent Labs  Lab 05/31/22 0141  AMMONIA 46*   CBC: Recent Labs  Lab 05/30/22 2340 06/01/22 0446 06/02/22 0640 06/03/22 0617 06/03/22 0933 06/04/22 0423 06/05/22 0619  WBC 2.1*   < > 3.2* 3.8* 3.1* 2.1* 1.7*  NEUTROABS 1.4*  --   --   --  1.9  --  0.7*  HGB 12.4*   < > 8.4* 8.8* 8.8* 8.1* 8.3*  HCT 36.0*   < > 23.7* 24.5* 24.8* 23.2* 24.1*  MCV 103.4*   < > 101.3* 102.1* 102.5* 102.7* 102.6*  PLT 63*   < > 27* 38* 31* 30* 31*   < > = values in this interval not displayed.   Cardiac Enzymes: No results for input(s): "CKTOTAL", "CKMB", "CKMBINDEX", "TROPONINI" in the last 168 hours. BNP: BNP (last 3 results) Recent Labs    11/06/21 0603 11/07/21 0158 02/28/22 1240  BNP 37.4 65.6 34.3    ProBNP (last 3 results) No results for input(s): "PROBNP" in the last 8760 hours.  CBG: No results for input(s): "GLUCAP" in the last 168 hours.     Signed:  Rhetta Mura MD   [facility] 06/06/2022, 12:02 PM

## 2022-06-06 NOTE — TOC Transition Note (Signed)
Transition of Care [facility]) - CM/SW Discharge Note   Patient Details  Name: Mathew [name] MRN: [id number] Date of Birth: 1980-08-19  [facility]) CM/SW Contact:  Ronny Bacon, RN Phone Number: 06/06/2022, 1:13 PM   Clinical Narrative:  Spoke with patient using interpreter by phone. Patient brother left him keys to his car for transport home. Patient preferred pharmacy updated and provider made aware of need to send prescriptions to updated pharmacy. Patient reports he is able to obtain his medications without issues.     Final next level of care: Home/Self Care Barriers to Discharge: No Barriers Identified   Patient Goals and CMS Choice      Discharge Placement                         Discharge Plan and Services Additional resources added to the After Visit Summary for                                       Social Determinants of Health (SDOH) Interventions SDOH Screenings   Food Insecurity: No Food Insecurity (02/19/2022)  Housing: Low Risk  (02/19/2022)  Transportation Needs: No Transportation Needs (02/19/2022)  Utilities: Not At Risk (02/19/2022)  Depression (PHQ2-9): High Risk (05/25/2022)  Tobacco Use: High Risk (06/02/2022)     Readmission Risk Interventions    06/06/2022   11:59 AM  Readmission Risk Prevention Plan  Transportation Screening Complete  PCP or Specialist Appt within 3-5 Days Complete  HRI or Home Care Consult Complete  Social Work Consult for Recovery Care Planning/Counseling Complete  Palliative Care Screening Complete  Medication Review [employer]) Complete

## 2022-06-06 NOTE — Progress Notes (Signed)
[facility] Liaison Note  Notified by TOC/Keshia of patient/family request of [facility] Paliative services.  [facility] hospital liaison will follow patient for discharge disposition.   Please call with any questions/concerns.    Thank you for the opportunity to participate in this patient's care.   Eugenie Birks, MSW [facility] Liaison  [phone]

## 2022-06-06 NOTE — TOC Progression Note (Signed)
Transition of Care [facility]) - Progression Note    Patient Details  Name: Mathew [name] MRN: [id number] Date of Birth: October 18, 1980  Transition of Care [facility]) CM/SW Contact  Ronny Bacon, RN Phone Number: 06/06/2022, 11:56 AM  Clinical Narrative:  Reached out to [facility] Liaison for Home with palliative services through charity care. Response received from [place] [facility] who will follow patient to discharge.          Expected Discharge Plan and Services                                               Social Determinants of Health (SDOH) Interventions SDOH Screenings   Food Insecurity: No Food Insecurity (02/19/2022)  Housing: Low Risk  (02/19/2022)  Transportation Needs: No Transportation Needs (02/19/2022)  Utilities: Not At Risk (02/19/2022)  Depression (PHQ2-9): High Risk (05/25/2022)  Tobacco Use: High Risk (06/02/2022)    Readmission Risk Interventions     No data to display

## 2022-06-06 NOTE — Progress Notes (Signed)
This chaplain was updated by PMT NP-Michelle on the Pt.  request to complete an Advance Directive. The chaplain understands the Pt. brother-Victor is the surrogate decision maker. The Pt. choice is documented in the Pt. chart notes.  The spiritual care department is without a notary because of the holiday; therefore an AD can not be completed today.  The chaplain understands from chart review education was provided on how to complete an AD outside the hospital if the Pt. d/c today.   The department is available for F/U spiritual care as needed.  Chaplain Stephanie Acre [phone]

## 2022-06-06 NOTE — Progress Notes (Signed)
Progress Note   Subjective  Patient feeling much better, in good spirits. Wants to go home.   Objective   Vital signs in last 24 hours: Temp:  [98 F (36.7 C)-99.8 F (37.7 C)] 99.1 F (37.3 C) (05/27 0742) Pulse Rate:  [73-81] 80 (05/27 0742) Resp:  [16-18] 16 (05/27 0742) BP: (101-113)/(59-70) 101/59 (05/27 0742) SpO2:  [97 %-100 %] 97 % (05/27 0742) Last BM Date : 06/02/22 General:    male in NAD Neurologic:  Alert and oriented,  grossly normal neurologically. Psych:  Cooperative. Normal mood and affect.  Intake/Output from previous day: 05/26 0701 - 05/27 0700 In: 377 [P.O.:377] Out: 250 [Urine:250] Intake/Output this shift: No intake/output data recorded.  Lab Results: Recent Labs    06/04/22 0423 06/05/22 0619  WBC 2.1* 1.7*  HGB 8.1* 8.3*  HCT 23.2* 24.1*  PLT 30* 31*   BMET Recent Labs    06/04/22 0423 06/05/22 0619  NA 128* 131*  K 4.2 4.5  CL 107 111  CO2 16* 16*  GLUCOSE 122* 96  BUN 21* 12  CREATININE 0.77 0.63  CALCIUM 8.3* 8.2*   LFT Recent Labs    06/05/22 0619  PROT 5.0*  ALBUMIN 2.8*  AST 51*  ALT 22  ALKPHOS 60  BILITOT 3.0*   PT/INR Recent Labs    06/04/22 0423 06/05/22 0619  LABPROT 27.4* 25.7*  INR 2.5* 2.3*    Studies/Results: No results found.     Assessment / Plan:    42 y/o male with the following:  Decompensated alcoholic cirrhosis SBP - ESBL E coli Suspected component of alcoholic hepatitis Alcohol abuse Hepatic encephalopathy Coagulopathy  He has completed 5 days of IV antibiotics and feels better. Given IV albumin. Renal function stable. Have held off on steroids for possible component of alcoholic hepatitis given his recent infection. Diuretics were held. Do not recommend any beta blockers moving forward for this patients given his SBP, they do worse with higher risk of hepatorenal syndrome.   He has severe thrombocytopenia / coagulopathy, high risk for variceal banding should he have  varices. He has no insurance and not sure if he could do an EGD with banding without that. I think we can resume low dose diuretics as outpatient but needs monitoring of his labs closely. I offered him a consult to see Hepatology as outpatient. I can order that, not sure how much this would cost him without insurance. He needs to completely abstain from alcohol and he seems motivated to do that. Otherwise, continue lactulose.   Finally, difficult situation with his SBP. He had ESBL E coli, he completed a course of Invanz and improved but now needs SBP prophylaxis and his E coli was resistant to Bactrim and Cipro. I am not sure what to offer him for that and maybe ID could help with recommendations for this.  I am happy to see him as outpatient if he can with his insurance status, etc, our office will contact him to schedule it and f/u labs if he is willing.  PLAN: - patient being discharged today - resume lasix 20mg  / day and aldactone 50mg  / day at discharge - low NA diet - complete alcohol abstinence - no beta blockers as outlined given SBP occurrence  - would benefit from EGD at some point - insurance issues may prohibit this and he is high risk for banding - ID consult to help with recommendations for SBP prophylaxis in light of ESBL  Ecoli - continue lactulose - I will offer outpatient follow up and labs if he is willing - needs CMET in 1-2 weeks - outpatient Hepatology referral  Call with questions.  Harlin Rain, MD [facility]

## 2022-06-07 ENCOUNTER — Telehealth: Payer: Self-pay

## 2022-06-07 ENCOUNTER — Other Ambulatory Visit (HOSPITAL_COMMUNITY): Payer: Self-pay

## 2022-06-07 DIAGNOSIS — K7031 Alcoholic cirrhosis of liver with ascites: Secondary | ICD-10-CM

## 2022-06-07 LAB — PATHOLOGIST SMEAR REVIEW

## 2022-06-07 NOTE — Telephone Encounter (Addendum)
78-month hospital f/u scheduled for 07/06/22 at 11 am with Dr. Adela Lank. Appt information mailed to patient. 1-week lab reminder and order in epic.  Referral, records, and pt's demographic information sent to [facility].

## 2022-06-07 NOTE — Telephone Encounter (Signed)
-----   Message from Benancio Deeds, MD sent at 06/06/2022 10:01 AM EDT ----- Regarding: outpatient follow up [facility] can you help with the following: - need CMET ordered for our lab to be done in 1 week - can you help coordinate outpatient follow up with me or APP in 1 month? - can you place referral to [facility] - hepatology - for decompensated alcoholic cirrhosis  Thanks

## 2022-06-07 NOTE — Transitions of Care (Post Inpatient/ED Visit) (Signed)
   06/07/2022  Name: Mathew [name] MRN: [id number] DOB: [birth year]  Today's TOC FU Call Status: Today's TOC FU Call Status:: Unsuccessul Call (1st Attempt) Unsuccessful Call (1st Attempt) Date: 06/07/22  Attempted to reach the patient regarding the most recent Inpatient/ED visit.  Follow Up Plan: Additional outreach attempts will be made to reach the patient to complete the Transitions of Care (Post Inpatient/ED visit) call.   The patient is scheduled to see Dr [name] at [facility] tomorrow, 06/08/2022.   Signature Robyne Peers, RN

## 2022-06-08 ENCOUNTER — Other Ambulatory Visit: Payer: Self-pay

## 2022-06-08 ENCOUNTER — Ambulatory Visit: Payer: Self-pay | Attending: Critical Care Medicine | Admitting: Critical Care Medicine

## 2022-06-08 ENCOUNTER — Encounter: Payer: Self-pay | Admitting: Critical Care Medicine

## 2022-06-08 VITALS — BP 106/67 | HR 87 | Temp 98.2°F | Ht 61.0 in | Wt 170.0 lb

## 2022-06-08 DIAGNOSIS — D61818 Other pancytopenia: Secondary | ICD-10-CM

## 2022-06-08 DIAGNOSIS — K652 Spontaneous bacterial peritonitis: Secondary | ICD-10-CM

## 2022-06-08 DIAGNOSIS — E871 Hypo-osmolality and hyponatremia: Secondary | ICD-10-CM

## 2022-06-08 DIAGNOSIS — K7460 Unspecified cirrhosis of liver: Secondary | ICD-10-CM

## 2022-06-08 DIAGNOSIS — K7290 Hepatic failure, unspecified without coma: Secondary | ICD-10-CM

## 2022-06-08 DIAGNOSIS — K7031 Alcoholic cirrhosis of liver with ascites: Secondary | ICD-10-CM

## 2022-06-08 MED ORDER — SPIRONOLACTONE 50 MG PO TABS
50.0000 mg | ORAL_TABLET | Freq: Every day | ORAL | 3 refills | Status: DC
  Filled 2022-06-08: qty 90, 90d supply, fill #0

## 2022-06-08 MED ORDER — FUROSEMIDE 20 MG PO TABS
20.0000 mg | ORAL_TABLET | Freq: Every day | ORAL | 3 refills | Status: DC
  Filled 2022-06-08: qty 60, 60d supply, fill #0

## 2022-06-08 MED ORDER — LACTULOSE 10 GM/15ML PO SOLN
30.0000 g | Freq: Four times a day (QID) | ORAL | 1 refills | Status: DC
  Filled 2022-06-08: qty 5400, 30d supply, fill #0

## 2022-06-08 MED ORDER — SODIUM BICARBONATE 650 MG PO TABS
650.0000 mg | ORAL_TABLET | Freq: Two times a day (BID) | ORAL | 1 refills | Status: DC
  Filled 2022-06-08: qty 120, 60d supply, fill #0

## 2022-06-08 NOTE — Assessment & Plan Note (Signed)
Studies improved patient will stay on ciprofloxacin indefinitely

## 2022-06-08 NOTE — Progress Notes (Signed)
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 features of the liver compatible with advanced cirrhosis. No suspicious enhancing liver lesions identified. 2. Stigmata of portal venous hypertension identified including varicosities, splenomegaly and ascites. 3. Gallstones. There is diffuse gallbladder wall edema which in the setting of cirrhosis, ascites and portal venous hypertension is nonspecific. 4. No signs of bile duct dilatation or choledocholithiasis. 5. Nonocclusive thrombus eccentric mural thrombus within the superior mesenteric vein. No thrombus identified within the portal  vein or splenic vein. 6. Small to moderate left pleural effusion. 7. Wall thickening involving the ascending colon which may reflect segmental colitis or hepatic colopathy. Electronically Signed   By: Signa Kell M.D.   On: 11/16/2021 11:51    MR 3D Recon At Scanner   Result Date: 11/16/2021 CLINICAL DATA:  Jaundice. Alcohol induced cirrhosis. Presents with abdominal pain. Evaluate for common bile duct stones. EXAM:  MRI ABDOMEN WITHOUT AND WITH CONTRAST (INCLUDING MRCP) TECHNIQUE: Multiplanar multisequence MR imaging of the abdomen was performed both before and after the administration of intravenous contrast. Heavily T2-weighted images of the biliary and pancreatic ducts were obtained, and three-dimensional MRCP images were rendered by post processing. CONTRAST:  7mL GADAVIST GADOBUTROL 1 MMOL/ML IV SOLN COMPARISON:  11/15/2021 FINDINGS: Lower chest: There is a small to moderate left pleural effusion. Hepatobiliary: The liver appears shrunken with a diffuse nodular contour compatible with advanced cirrhosis. On the arterial phase images there are no focal enhancing liver lesions identified. No abnormal areas of washout noted on the delayed images. Multiple stones are identified within the gallbladder which measure up to 8 mm as seen on the CT from the prior day. There is diffuse gallbladder wall edema which in the setting of cirrhosis, ascites and portal venous hypertension is nonspecific. The gallbladder wall measures up to 7 mm in thickness, image 19/3. Motion artifact limits assessment of the common bile duct. The common bile duct has a normal caliber measuring 3 mm. No intrahepatic bile duct dilatation. Within the limitation of motion artifact, there are no signs to suggest choledocholithiasis. Pancreas: No mass, inflammatory changes, or other parenchymal abnormality identified. Spleen: Measures 13.8 x 5.7 by 15.7 cm (volume = 650 cm^3). No suspicious splenic lesion. Adrenals/Urinary Tract: Normal adrenal glands. No hydronephrosis or kidney mass identified. Stomach/Bowel: Visualized portions within the abdomen are unremarkable. There is wall thickening involving the ascending colon which may reflect segmental colitis or hepatic colopathy. No pathologic dilatation of the large or small bowel loops. Vascular/Lymphatic: Normal appearance of the abdominal aorta. Upper abdominal varicosities are identified. Nonocclusive  thrombus eccentric mural thrombus identified within the superior mesenteric vein, image 79/1302. The splenic vein and portal vein remain patent. There is diffuse venous congestion identified within the central mesentery. No significant adenopathy identified. Other:  There is a large volume of abdominopelvic ascites. Musculoskeletal: IMPRESSION: 1. Morphologic features of the liver compatible with advanced cirrhosis. No suspicious enhancing liver lesions identified. 2. Stigmata of portal venous hypertension identified including varicosities, splenomegaly and ascites. 3. Gallstones. There is diffuse gallbladder wall edema which in the setting of cirrhosis, ascites and portal venous hypertension is nonspecific. 4. No signs of bile duct dilatation or choledocholithiasis. 5. Nonocclusive thrombus eccentric mural thrombus within the superior mesenteric vein. No thrombus identified within the portal vein or splenic vein. 6. Small to moderate left pleural effusion. 7. Wall thickening involving the ascending colon which may reflect segmental colitis or hepatic colopathy. Electronically Signed   By: Signa Kell M.D.   On: 11/16/2021 11:51    CT ABDOMEN PELVIS W CONTRAST   Result Date: 11/15/2021 CLINICAL DATA:  Flank pain elevated bilirubin EXAM: CT ABDOMEN AND PELVIS WITH CONTRAST TECHNIQUE: Multidetector CT imaging of the abdomen and pelvis was performed using the standard protocol following bolus administration of intravenous contrast. RADIATION DOSE REDUCTION: This exam was performed according to the departmental dose-optimization program which includes automated exposure control, adjustment of the mA and/or kV according to patient size and/or use of iterative reconstruction technique. CONTRAST:  75mL OMNIPAQUE IOHEXOL 350 MG/ML SOLN COMPARISON:  CT 11/05/2021, 09/01/2021 FINDINGS: Lower chest: Lung bases demonstrate interval small left effusion with passive atelectasis at the left base. Hepatobiliary: Liver  cirrhosis. Contracted gallbladder with multiple stones. No intra hepatic or extrahepatic biliary dilatation. Questionable small ductal stones, coronal series 6 image 59 Pancreas: Unremarkable. No pancreatic ductal dilatation or surrounding inflammatory changes. Spleen: Enlarged, craniocaudal measurement of 16.2 cm. Adrenals/Urinary Tract: Adrenal glands are normal. Kidneys show no hydronephrosis. Slightly thick-walled urinary bladder Stomach/Bowel: The stomach is moderately enlarged. There is no dilated small bowel. Fluid in the colon. There is possible wall thickening of the right and proximal transverse colon. Vascular/Lymphatic: Nonaneurysmal aorta.  No suspicious lymph node Reproductive: Prostate is unremarkable. Other: No free air. Interim development of at least moderate volume abdominopelvic ascites. Small umbilical region hernia containing fluid. Musculoskeletal: No acute osseous abnormality IMPRESSION: 1. Liver cirrhosis. Interim development of at least moderate volume abdominopelvic ascites. Splenomegaly. 2. Contracted gallbladder with multiple stones. Questionable small stones within the common bile duct. No intra or extrahepatic biliary dilatation. 3. Fluid in the colon. Possible wall thickening of the right and proximal transverse colon which may be secondary to colitis or portal colopathy. 4. Interval small left effusion with passive atelectasis at the left base. 5. Slightly thick-walled appearance of the urinary bladder, correlate for cystitis. Electronically Signed   By: Jasmine Pang M.D.   On: 11/15/2021 19:24    DG Chest 2 View   Result Date: 11/11/2021 CLINICAL DATA:  Provided history: Chest pain. Additional history provided: Shortness of breath, history of cirrhosis. EXAM: CHEST - 2 VIEW COMPARISON:  Prior chest radiographs 11/05/2021 and earlier. FINDINGS: Heart size within normal limits. No appreciable airspace consolidation. No evidence of pleural effusion or pneumothorax. No acute bony  abnormality identified. IMPRESSION: No evidence of active cardiopulmonary disease. Electronically Signed   By: Jackey Loge D.O.   On: 11/11/2021 13:11    DG ELBOW COMPLETE LEFT (3+VIEW)   Result Date: 11/07/2021 CLINICAL DATA:  Pain after fall this morning. EXAM: LEFT ELBOW - COMPLETE 3+ VIEW COMPARISON:  None Available. FINDINGS: There is no evidence of fracture, dislocation, or joint effusion. There is no evidence of arthropathy or other focal bone abnormality. Soft tissues are unremarkable. IMPRESSION: Negative. Electronically Signed   By: Gerome Sam III M.D.   On: 11/07/2021 09:58    ECHOCARDIOGRAM COMPLETE   Result Date: 11/06/2021    ECHOCARDIOGRAM REPORT   Patient Name:   BRENTYN SKOOG CORDOBA Date of Exam: 11/06/2021 Medical Rec #:  [id number]            Height:       65.0 in Accession #:    [id number]           Weight:       183.7 lb Date of Birth:  1980-05-29            BSA:          1.908 m Patient Age:    41 years             BP:           110/58 mmHg Patient Gender: M                    HR:           85 bpm. Exam Location:  Inpatient Procedure: 2D Echo, Cardiac Doppler and Color Doppler Indications:    R07.9 Chest Pain  History:  Patient has prior history of Echocardiogram examinations, most                 recent 06/17/2021. Tobacco and ETOH abuse, Cirrhosis of liver.  Sonographer:    Celesta Gentile RCS Referring Phys: Effie Shy [place] [facility] S [facility] IMPRESSIONS  1. Left ventricular ejection fraction, by estimation, is 65 to 70%. The left ventricle has normal function. The left ventricle has no regional wall motion abnormalities. There is mild concentric left ventricular hypertrophy. Left ventricular diastolic parameters are consistent with Grade I diastolic dysfunction (impaired relaxation).  2. Right ventricular systolic function is hyperdynamic. The right ventricular size is normal. There is normal pulmonary artery systolic pressure.  3. Left atrial size was mildly dilated.  4. The mitral  valve is normal in structure. No evidence of mitral valve regurgitation. No evidence of mitral stenosis.  5. The aortic valve is tricuspid. Aortic valve regurgitation is not visualized. No aortic stenosis is present.  6. The inferior vena cava is normal in size with greater than 50% respiratory variability, suggesting right atrial pressure of 3 mmHg. Comparison(s): No significant change from prior study. FINDINGS  Left Ventricle: Left ventricular ejection fraction, by estimation, is 65 to 70%. The left ventricle has normal function. The left ventricle has no regional wall motion abnormalities. The left ventricular internal cavity size was normal in size. There is  mild concentric left ventricular hypertrophy. Left ventricular diastolic parameters are consistent with Grade I diastolic dysfunction (impaired relaxation). Right Ventricle: The right ventricular size is normal. No increase in right ventricular wall thickness. Right ventricular systolic function is hyperdynamic. There is normal pulmonary artery systolic pressure. The tricuspid regurgitant velocity is 2.30 m/s, and with an assumed right atrial pressure of 3 mmHg, the estimated right ventricular systolic pressure is 24.2 mmHg. Left Atrium: Left atrial size was mildly dilated. Right Atrium: Right atrial size was normal in size. Pericardium: There is no evidence of pericardial effusion. Mitral Valve: The mitral valve is normal in structure. No evidence of mitral valve regurgitation. No evidence of mitral valve stenosis. Tricuspid Valve: The tricuspid valve is normal in structure. Tricuspid valve regurgitation is mild . No evidence of tricuspid stenosis. Aortic Valve: The aortic valve is tricuspid. Aortic valve regurgitation is not visualized. No aortic stenosis is present. Aortic valve mean gradient measures 7.0 mmHg. Aortic valve peak gradient measures 14.4 mmHg. Aortic valve area, by VTI measures 2.11  cm. Pulmonic Valve: The pulmonic valve was not well  visualized. Pulmonic valve regurgitation is not visualized. Aorta: The aortic root is normal in size and structure and the ascending aorta was not well visualized. Venous: The inferior vena cava is normal in size with greater than 50% respiratory variability, suggesting right atrial pressure of 3 mmHg. IAS/Shunts: No atrial level shunt detected by color flow Doppler.  LEFT VENTRICLE PLAX 2D LVIDd:         4.60 cm   Diastology LVIDs:         2.50 cm   LV e' medial:    8.05 cm/s LV PW:         1.20 cm   LV E/e' medial:  7.3 LV IVS:        1.20 cm   LV e' lateral:   9.14 cm/s LVOT diam:     1.90 cm   LV E/e' lateral: 6.5 LV SV:         73 LV SV Index:   38 LVOT Area:     2.84 cm  RIGHT  VENTRICLE RV S prime:     23.50 cm/s TAPSE (M-mode): 2.4 cm LEFT ATRIUM             Index        RIGHT ATRIUM           Index LA diam:        4.70 cm 2.46 cm/m   RA Area:     20.30 cm LA Vol (A2C):   55.8 ml 29.24 ml/m  RA Volume:   58.90 ml  30.87 ml/m LA Vol (A4C):   70.3 ml 36.84 ml/m LA Biplane Vol: 62.9 ml 32.97 ml/m  AORTIC VALVE AV Area (Vmax):    1.72 cm AV Area (Vmean):   2.00 cm AV Area (VTI):     2.11 cm AV Vmax:           190.00 cm/s AV Vmean:          117.500 cm/s AV VTI:            0.349 m AV Peak Grad:      14.4 mmHg AV Mean Grad:      7.0 mmHg LVOT Vmax:         115.00 cm/s LVOT Vmean:        82.900 cm/s LVOT VTI:          0.259 m LVOT/AV VTI ratio: 0.74  AORTA Ao Root diam: 3.10 cm MITRAL VALVE               TRICUSPID VALVE MV Area (PHT): 2.66 cm    TR Peak grad:   21.2 mmHg MV Decel Time: 285 msec    TR Vmax:        230.00 cm/s MV E velocity: 59.10 cm/s MV A velocity: 75.20 cm/s  SHUNTS MV E/A ratio:  0.79        Systemic VTI:  0.26 m                            Systemic Diam: 1.90 cm Riley Lam MD Electronically signed by Riley Lam MD Signature Date/Time: 11/06/2021/3:59:57 PM    Final     [place] ASCITES (ABDOMEN LIMITED)   Result Date: 11/06/2021 CLINICAL DATA:  Assess ascites. EXAM:  LIMITED ABDOMEN ULTRASOUND FOR ASCITES TECHNIQUE: Limited ultrasound survey for ascites was performed in all four abdominal quadrants. COMPARISON:  None Available. FINDINGS: A small amount of ascites is identified throughout the abdomen. No drainable collection of ascites is noted. IMPRESSION: Small amount of ascites. The amount of ascites is not amenable to paracentesis. Electronically Signed   By: Gerome Sam III M.D.   On: 11/06/2021 10:13    CT Head Wo Contrast   Result Date: 11/05/2021 CLINICAL DATA:  Cirrhosis, altered level of consciousness, confusion EXAM: CT HEAD WITHOUT CONTRAST TECHNIQUE: Contiguous axial images were obtained from the base of the skull through the vertex without intravenous contrast. RADIATION DOSE REDUCTION: This exam was performed according to the departmental dose-optimization program which includes automated exposure control, adjustment of the mA and/or kV according to patient size and/or use of iterative reconstruction technique. COMPARISON:  06/30/2021 FINDINGS: Brain: No acute infarct or hemorrhage. Lateral ventricles and midline structures are unremarkable. No acute extra-axial fluid collections. No mass effect. Vascular: No hyperdense vessel or unexpected calcification. Skull: Normal. Negative for fracture or focal lesion. Sinuses/Orbits: No acute finding. Other: None. IMPRESSION: 1. No acute intracranial process. Electronically Signed   By: Maxwell Caul.D.  On: 11/05/2021 18:08    CT ABDOMEN PELVIS W CONTRAST   Result Date: 11/05/2021 CLINICAL DATA:  Acute abdominal pain. History of cirrhosis and ascites. EXAM: CT ABDOMEN AND PELVIS WITH CONTRAST TECHNIQUE: Multidetector CT imaging of the abdomen and pelvis was performed using the standard protocol following bolus administration of intravenous contrast. RADIATION DOSE REDUCTION: This exam was performed according to the departmental dose-optimization program which includes automated exposure control, adjustment  of the mA and/or kV according to patient size and/or use of iterative reconstruction technique. CONTRAST:  75mL OMNIPAQUE IOHEXOL 350 MG/ML SOLN COMPARISON:  CT abdomen and pelvis 09/01/2021 FINDINGS: Lower chest: No acute abnormality. Hepatobiliary: Gallstones are present. Mild nodular liver contour persists. No focal liver lesions are seen. Gallstones are present. There is no biliary ductal dilatation. Pancreas: Unremarkable. No pancreatic ductal dilatation or surrounding inflammatory changes. Spleen: Borderline enlarged, unchanged. Adrenals/Urinary Tract: Adrenal glands are unremarkable. Kidneys are normal, without renal calculi, focal lesion, or hydronephrosis. Bladder is unremarkable. Stomach/Bowel: There is diffuse gaseous distention of the colon without dilatation. There is no focal wall thickening or inflammation. The appendix, small bowel and stomach are within normal limits. Vascular/Lymphatic: No significant vascular findings are present. No enlarged abdominal or pelvic lymph nodes. Reproductive: Prostate is unremarkable. Other: No abdominal wall hernia or abnormality. No abdominopelvic ascites. Musculoskeletal: No acute or significant osseous findings. IMPRESSION: 1. No acute localizing process in the abdomen or pelvis. 2. Stable findings of cirrhosis and portal hypertension. 3. Cholelithiasis. Electronically Signed   By: Darliss Cheney M.D.   On: 11/05/2021 18:08    DG Chest 2 View   Result Date: 11/05/2021 CLINICAL DATA:  Altered mental status EXAM: CHEST - 2 VIEW COMPARISON:  07/19/2021 FINDINGS: Transverse diameter of heart is increased. There are no signs of pulmonary edema or focal pulmonary consolidation. There is no pleural effusion or pneumothorax. IMPRESSION: There are no focal infiltrates or signs of pulmonary edema. Electronically Signed   By: Ernie Avena M.D.   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    Studies improved patient will stay on ciprofloxacin indefinitely      Relevant Orders   Comprehensive metabolic panel   Decompensated hepatic cirrhosis (HCC)    Patient advised to avoid all alcohol Continue lactulose and sodium bicarb follow-up labs        Hematopoietic and Hemostatic   Pancytopenia (HCC)    Follow CBC      Relevant Orders   CBC with Differential/Platelet     Other   Spontaneous bacterial peritonitis (HCC)    Appears to be improved follow-up CBC      RESOLVED: Hyponatremia   Relevant Orders   Comprehensive metabolic panel  Return in about 1 month (around 07/09/2022) for chronic conditions.  Extra time needed for Spanish language interpretation and assessment of high risk conditions complex high Shan Levans, MD

## 2022-06-08 NOTE — Patient Instructions (Signed)
Labs to be obtained today  All refills in the future will be sent to our pharmacy downstairs  Return one month

## 2022-06-08 NOTE — Assessment & Plan Note (Signed)
Patient advised to avoid all alcohol Continue lactulose and sodium bicarb follow-up labs

## 2022-06-08 NOTE — Assessment & Plan Note (Signed)
Appears to be improved follow-up CBC

## 2022-06-08 NOTE — Assessment & Plan Note (Signed)
Follow CBC. 

## 2022-06-09 LAB — COMPREHENSIVE METABOLIC PANEL
AST: 77 IU/L — ABNORMAL HIGH (ref 0–40)
Albumin/Globulin Ratio: 1.5 (ref 1.2–2.2)
Alkaline Phosphatase: 127 IU/L — ABNORMAL HIGH (ref 44–121)
BUN/Creatinine Ratio: 10 (ref 9–20)
BUN: 5 mg/dL — ABNORMAL LOW (ref 6–24)
CO2: 15 mmol/L — ABNORMAL LOW (ref 20–29)
Globulin, Total: 2.3 g/dL (ref 1.5–4.5)
Potassium: 4.1 mmol/L (ref 3.5–5.2)

## 2022-06-09 LAB — CBC WITH DIFFERENTIAL/PLATELET

## 2022-06-09 NOTE — Progress Notes (Signed)
   Portal Hypertension Clinic Screening Evaluation   Indication for evaluation: Unknown Mathew [name] is a 42 y.o. male undergoing preliminary evaluation in the [facility] Interventional Radiology Portal Hypertension Clinic due to recurrent ascites.  Referring Physician/Established Gastroenterologist:  none currently, previously a patient at [facility] but dismissed  Etiology of cirrhosis: EtOH Initially diagnosed: 2020 # of paracentesis in last month: 3 # of paracentesis in last 2 months: 3 History of hepatic hydrothorax:  no History of hepatic encephalopathy: yes  Prior evaluation for liver transplant: no History of hepatocellular carcinoma: no  Prior esophagogastroduodenoscopy/intervention: 03/17/2018 (Dr. Matthias Hughs) - Grade II esophageal varices, hiatal hernia, portal hypertensive gastropathy, no intervention Current esophageal varices: yes Current gastric varices: no History of hematemesis: no  Current diuretic regimen: furosemide 20 mg QD, spironolactone 50 mg QD Current pharmacologic encephalopathy prophylaxis/treatment: lactulose 30 g q6H  History of renal dysfunction: no History of hemodialysis: no  History of cardiac dysfunction: no  Other pertinent past medical history: recent spontaneous bacterial peritonitis, ongoing alcohol use, restrictive social factors including homelessness, symptomatic umbilical hernia, prior SMV thrombus   Imaging: Prior cross sectional imaging of portal system: CT AP 03/10/22   Echocardiogram:  11/06/21 IMPRESSIONS   1. Left ventricular ejection fraction, by estimation, is 65 to 70%. The  left ventricle has normal function. The left ventricle has no regional  wall motion abnormalities. There is mild concentric left ventricular  hypertrophy. Left ventricular diastolic  parameters are consistent with Grade I diastolic dysfunction (impaired  relaxation).   2. Right ventricular systolic function is hyperdynamic. The right   ventricular size is normal. There is normal pulmonary artery systolic  pressure.   3. Left atrial size was mildly dilated.   4. The mitral valve is normal in structure. No evidence of mitral valve  regurgitation. No evidence of mitral stenosis.   5. The aortic valve is tricuspid. Aortic valve regurgitation is not  visualized. No aortic stenosis is present.   6. The inferior vena cava is normal in size with greater than 50%  respiratory variability, suggesting right atrial pressure of 3 mmHg.    Labs: 06/08/22 Creatinine: 0.51 Total Bilirubin: 2.3 INR: 2.3 Sodium: 132 Albumin: 3.4  Child-Pugh = 10 points, class C MELD = 19 (6.0% estimated 3 month mortality after TIPS) [place] Index of Post-TIPS Survival (FIPS) = -1.83 (overall survival after TIPS estimated at 1 month 99.3%, 3 months 97.7%, and 6 months 96.2%)    Assessment: Mathew [name] is a 42 y.o. male with history of decompensated alcoholic cirrhosis (Child Pugh C10, MELD 19) with recent onset recurrent ascites.  After preliminary evaluation, this patient would likely benefit greatly from TIPS creation, however there are several limiting factors that diminish candidacy including reported ongoing alcohol use, possible medication non-compliance, and limiting social factors including homelessness.  Recommendation: Formal consult for TIPS could be considered and would likely greatly benefit the patient should diuretic maximization be unsuccessful in controlling ascites and if the patient can remain abstinent from alcohol use.  This evaluation was sent to the patient's PCP Dr. Shan Levans and Annamarie Major, NP with whom the patient has an upcoming appointment.    Electronically Signed: Bennie Dallas, MD 06/09/2022, 9:41 AM

## 2022-06-10 ENCOUNTER — Ambulatory Visit (HOSPITAL_BASED_OUTPATIENT_CLINIC_OR_DEPARTMENT_OTHER): Payer: Self-pay | Admitting: Internal Medicine

## 2022-06-10 LAB — CBC WITH DIFFERENTIAL/PLATELET
Basophils Absolute: 0 10*3/uL (ref 0.0–0.2)
Basos: 1 %
Eos: 2 %
Hemoglobin: 8.9 g/dL — ABNORMAL LOW (ref 13.0–17.7)
Lymphs: 34 %
MCH: 35.7 pg — ABNORMAL HIGH (ref 26.6–33.0)
MCHC: 36.5 g/dL — ABNORMAL HIGH (ref 31.5–35.7)
MCV: 98 fL — ABNORMAL HIGH (ref 79–97)
Monocytes: 16 %
Neutrophils Absolute: 0.8 10*3/uL — ABNORMAL LOW (ref 1.4–7.0)
Neutrophils: 46 %
Platelets: 41 10*3/uL — CL (ref 150–450)
RBC: 2.49 x10E6/uL — CL (ref 4.14–5.80)
RDW: 11.5 % — ABNORMAL LOW (ref 11.6–15.4)
WBC: 1.8 10*3/uL — CL (ref 3.4–10.8)

## 2022-06-10 LAB — COMPREHENSIVE METABOLIC PANEL
ALT: 33 IU/L (ref 0–44)
Albumin: 3.4 g/dL — ABNORMAL LOW (ref 4.1–5.1)
Bilirubin Total: 2.3 mg/dL — ABNORMAL HIGH (ref 0.0–1.2)
Calcium: 8 mg/dL — ABNORMAL LOW (ref 8.7–10.2)
Chloride: 107 mmol/L — ABNORMAL HIGH (ref 96–106)
Creatinine, Ser: 0.51 mg/dL — ABNORMAL LOW (ref 0.76–1.27)
Glucose: 70 mg/dL (ref 70–99)
Sodium: 132 mmol/L — ABNORMAL LOW (ref 134–144)
Total Protein: 5.7 g/dL — ABNORMAL LOW (ref 6.0–8.5)
eGFR: 128 mL/min/{1.73_m2} (ref >59)

## 2022-06-13 ENCOUNTER — Other Ambulatory Visit: Payer: Self-pay

## 2022-06-13 DIAGNOSIS — K7031 Alcoholic cirrhosis of liver with ascites: Secondary | ICD-10-CM

## 2022-06-14 ENCOUNTER — Telehealth: Payer: Self-pay

## 2022-06-14 NOTE — Telephone Encounter (Signed)
-----   Message from Missy Sabins, RN sent at 06/07/2022 10:04 AM EDT ----- Regarding: 1-week labs CMET DUE - order in epic

## 2022-06-14 NOTE — Telephone Encounter (Signed)
Per 5/29 CMET result note patient is not due for CMET until next week.   Received a fax from [facility] stating that they have reached out to patient to schedule an appt but they have not been able to reach patient.   Called patient - his phone goes straight to vm. I left pt a detailed vm informing him that a referral was placed to [facility] and they have reached out to him, but have not been able to speak with him. I asked that patient give them a call back to schedule his appointment [phone]). I advised pt to call our office if he has any questions or concerns.

## 2022-06-21 ENCOUNTER — Telehealth: Payer: Self-pay

## 2022-06-21 NOTE — Telephone Encounter (Signed)
Laban Emperor, front office has called patient several times.  Unable to leave a message

## 2022-06-21 NOTE — Telephone Encounter (Signed)
-----   Message from Cooper Render, CMA sent at 06/13/2022  1:49 PM EDT ----- Regarding: due for CMET Spanish speaking patient due for CMET later this week, around Thursday. Order is in.  Does not appear that he checks MyChart

## 2022-06-22 NOTE — Telephone Encounter (Signed)
Letter mailed to patient since we are unable to reach him via phone or Mychart

## 2022-06-22 NOTE — Telephone Encounter (Signed)
Mathew [name] has continued to call patient but has been unable to reach him. Will mail letter to patient.

## 2022-06-23 ENCOUNTER — Emergency Department (HOSPITAL_COMMUNITY): Payer: Medicaid Other

## 2022-06-23 ENCOUNTER — Inpatient Hospital Stay (HOSPITAL_COMMUNITY)
Admission: EM | Admit: 2022-06-23 | Discharge: 2022-07-02 | DRG: 432 | Disposition: A | Discharge status: Home / Self Care | Payer: Medicaid Other | Attending: Internal Medicine | Admitting: Internal Medicine

## 2022-06-23 DIAGNOSIS — Z79899 Other long term (current) drug therapy: Secondary | ICD-10-CM

## 2022-06-23 DIAGNOSIS — Z1612 Extended spectrum beta lactamase (ESBL) resistance: Secondary | ICD-10-CM | POA: Diagnosis present

## 2022-06-23 DIAGNOSIS — K7030 Alcoholic cirrhosis of liver without ascites: Secondary | ICD-10-CM | POA: Diagnosis present

## 2022-06-23 DIAGNOSIS — D61818 Other pancytopenia: Secondary | ICD-10-CM | POA: Diagnosis present

## 2022-06-23 DIAGNOSIS — F17210 Nicotine dependence, cigarettes, uncomplicated: Secondary | ICD-10-CM | POA: Diagnosis present

## 2022-06-23 DIAGNOSIS — Z66 Do not resuscitate: Secondary | ICD-10-CM | POA: Diagnosis present

## 2022-06-23 DIAGNOSIS — E669 Obesity, unspecified: Secondary | ICD-10-CM | POA: Diagnosis present

## 2022-06-23 DIAGNOSIS — K7682 Hepatic encephalopathy: Secondary | ICD-10-CM | POA: Diagnosis present

## 2022-06-23 DIAGNOSIS — K7290 Hepatic failure, unspecified without coma: Secondary | ICD-10-CM | POA: Diagnosis present

## 2022-06-23 DIAGNOSIS — N179 Acute kidney failure, unspecified: Secondary | ICD-10-CM | POA: Diagnosis not present

## 2022-06-23 DIAGNOSIS — F1010 Alcohol abuse, uncomplicated: Secondary | ICD-10-CM | POA: Diagnosis present

## 2022-06-23 DIAGNOSIS — D638 Anemia in other chronic diseases classified elsewhere: Secondary | ICD-10-CM | POA: Diagnosis present

## 2022-06-23 DIAGNOSIS — K760 Fatty (change of) liver, not elsewhere classified: Secondary | ICD-10-CM | POA: Diagnosis present

## 2022-06-23 DIAGNOSIS — D696 Thrombocytopenia, unspecified: Secondary | ICD-10-CM | POA: Diagnosis present

## 2022-06-23 DIAGNOSIS — E8809 Other disorders of plasma-protein metabolism, not elsewhere classified: Secondary | ICD-10-CM | POA: Diagnosis present

## 2022-06-23 DIAGNOSIS — E872 Acidosis, unspecified: Secondary | ICD-10-CM | POA: Diagnosis present

## 2022-06-23 DIAGNOSIS — K7460 Unspecified cirrhosis of liver: Secondary | ICD-10-CM | POA: Diagnosis present

## 2022-06-23 DIAGNOSIS — D689 Coagulation defect, unspecified: Secondary | ICD-10-CM | POA: Diagnosis present

## 2022-06-23 DIAGNOSIS — Z1629 Resistance to other single specified antibiotic: Secondary | ICD-10-CM | POA: Diagnosis present

## 2022-06-23 DIAGNOSIS — K7031 Alcoholic cirrhosis of liver with ascites: Principal | ICD-10-CM | POA: Diagnosis present

## 2022-06-23 DIAGNOSIS — E8770 Fluid overload, unspecified: Secondary | ICD-10-CM | POA: Diagnosis not present

## 2022-06-23 DIAGNOSIS — Z603 Acculturation difficulty: Secondary | ICD-10-CM | POA: Diagnosis present

## 2022-06-23 DIAGNOSIS — Z720 Tobacco use: Secondary | ICD-10-CM | POA: Diagnosis present

## 2022-06-23 DIAGNOSIS — E871 Hypo-osmolality and hyponatremia: Secondary | ICD-10-CM | POA: Diagnosis not present

## 2022-06-23 DIAGNOSIS — B9620 Unspecified Escherichia coli [E. coli] as the cause of diseases classified elsewhere: Secondary | ICD-10-CM | POA: Diagnosis present

## 2022-06-23 DIAGNOSIS — R1084 Generalized abdominal pain: Secondary | ICD-10-CM | POA: Diagnosis present

## 2022-06-23 DIAGNOSIS — K652 Spontaneous bacterial peritonitis: Secondary | ICD-10-CM | POA: Diagnosis present

## 2022-06-23 DIAGNOSIS — Y905 Blood alcohol level of 100-119 mg/100 ml: Secondary | ICD-10-CM | POA: Diagnosis present

## 2022-06-23 DIAGNOSIS — D539 Nutritional anemia, unspecified: Secondary | ICD-10-CM | POA: Insufficient documentation

## 2022-06-23 DIAGNOSIS — Z6832 Body mass index (BMI) 32.0-32.9, adult: Secondary | ICD-10-CM

## 2022-06-23 DIAGNOSIS — K429 Umbilical hernia without obstruction or gangrene: Secondary | ICD-10-CM | POA: Diagnosis present

## 2022-06-23 DIAGNOSIS — F1090 Alcohol use, unspecified, uncomplicated: Secondary | ICD-10-CM | POA: Diagnosis present

## 2022-06-23 DIAGNOSIS — E876 Hypokalemia: Secondary | ICD-10-CM | POA: Diagnosis present

## 2022-06-23 DIAGNOSIS — D731 Hypersplenism: Secondary | ICD-10-CM | POA: Diagnosis present

## 2022-06-23 HISTORY — PX: IR PARACENTESIS: IMG2679

## 2022-06-23 LAB — CBC WITH DIFFERENTIAL/PLATELET
Abs Immature Granulocytes: 0.01 10*3/uL (ref 0.00–0.07)
Basophils Absolute: 0 10*3/uL (ref 0.0–0.1)
Basophils Relative: 0 %
Eosinophils Absolute: 0 10*3/uL (ref 0.0–0.5)
Eosinophils Relative: 1 %
HCT: 31.8 % — ABNORMAL LOW (ref 39.0–52.0)
Hemoglobin: 10.5 g/dL — ABNORMAL LOW (ref 13.0–17.0)
Immature Granulocytes: 1 %
Lymphocytes Relative: 47 %
Lymphs Abs: 1 10*3/uL (ref 0.7–4.0)
MCH: 35.6 pg — ABNORMAL HIGH (ref 26.0–34.0)
MCHC: 33 g/dL (ref 30.0–36.0)
MCV: 107.8 fL — ABNORMAL HIGH (ref 80.0–100.0)
Monocytes Absolute: 0.1 10*3/uL (ref 0.1–1.0)
Monocytes Relative: 6 %
Neutro Abs: 1 10*3/uL — ABNORMAL LOW (ref 1.7–7.7)
Neutrophils Relative %: 45 %
Platelets: 35 10*3/uL — ABNORMAL LOW (ref 150–400)
RBC: 2.95 MIL/uL — ABNORMAL LOW (ref 4.22–5.81)
RDW: 14.5 % (ref 11.5–15.5)
WBC: 2.1 10*3/uL — ABNORMAL LOW (ref 4.0–10.5)
nRBC: 0 % (ref 0.0–0.2)

## 2022-06-23 LAB — I-STAT VENOUS BLOOD GAS, ED
Acid-base deficit: 6 mmol/L — ABNORMAL HIGH (ref 0.0–2.0)
Bicarbonate: 17.9 mmol/L — ABNORMAL LOW (ref 20.0–28.0)
Calcium, Ion: 1.01 mmol/L — ABNORMAL LOW (ref 1.15–1.40)
HCT: 29 % — ABNORMAL LOW (ref 39.0–52.0)
Hemoglobin: 9.9 g/dL — ABNORMAL LOW (ref 13.0–17.0)
O2 Saturation: 93 %
Potassium: 3.4 mmol/L — ABNORMAL LOW (ref 3.5–5.1)
Sodium: 140 mmol/L (ref 135–145)
TCO2: 19 mmol/L — ABNORMAL LOW (ref 22–32)
pCO2, Ven: 30 mmHg — ABNORMAL LOW (ref 44–60)
pH, Ven: 7.384 (ref 7.25–7.43)
pO2, Ven: 66 mmHg — ABNORMAL HIGH (ref 32–45)

## 2022-06-23 LAB — BODY FLUID CELL COUNT WITH DIFFERENTIAL
Eos, Fluid: 0 %
Lymphs, Fluid: 5 %
Monocyte-Macrophage-Serous Fluid: 10 % — ABNORMAL LOW (ref 50–90)
Neutrophil Count, Fluid: 85 % — ABNORMAL HIGH (ref 0–25)
Total Nucleated Cell Count, Fluid: 9210 cu mm — ABNORMAL HIGH (ref 0–1000)

## 2022-06-23 LAB — PROTIME-INR
INR: 2.8 — ABNORMAL HIGH (ref 0.8–1.2)
Prothrombin Time: 30 seconds — ABNORMAL HIGH (ref 11.4–15.2)

## 2022-06-23 LAB — COMPREHENSIVE METABOLIC PANEL
ALT: 18 U/L (ref 0–44)
AST: 43 U/L — ABNORMAL HIGH (ref 15–41)
Albumin: 2.6 g/dL — ABNORMAL LOW (ref 3.5–5.0)
Alkaline Phosphatase: 92 U/L (ref 38–126)
Anion gap: 14 (ref 5–15)
BUN: <5 mg/dL — ABNORMAL LOW (ref 6–20)
CO2: 15 mmol/L — ABNORMAL LOW (ref 22–32)
Calcium: 7.7 mg/dL — ABNORMAL LOW (ref 8.9–10.3)
Chloride: 108 mmol/L (ref 98–111)
Creatinine, Ser: 0.67 mg/dL (ref 0.61–1.24)
GFR, Estimated: >60 mL/min (ref >60)
Glucose, Bld: 105 mg/dL — ABNORMAL HIGH (ref 70–99)
Potassium: 3.3 mmol/L — ABNORMAL LOW (ref 3.5–5.1)
Sodium: 137 mmol/L (ref 135–145)
Total Bilirubin: 3.8 mg/dL — ABNORMAL HIGH (ref 0.3–1.2)
Total Protein: 5.4 g/dL — ABNORMAL LOW (ref 6.5–8.1)

## 2022-06-23 LAB — PROTEIN, PLEURAL OR PERITONEAL FLUID: Total protein, fluid: <3 g/dL

## 2022-06-23 LAB — ETHANOL: Alcohol, Ethyl (B): 102 mg/dL — ABNORMAL HIGH (ref <10)

## 2022-06-23 LAB — MAGNESIUM: Magnesium: 1.6 mg/dL — ABNORMAL LOW (ref 1.7–2.4)

## 2022-06-23 LAB — GLUCOSE, PLEURAL OR PERITONEAL FLUID: Glucose, Fluid: 79 mg/dL

## 2022-06-23 LAB — LIPASE, BLOOD: Lipase: 32 U/L (ref 11–51)

## 2022-06-23 LAB — AMMONIA: Ammonia: 42 umol/L — ABNORMAL HIGH (ref 9–35)

## 2022-06-23 LAB — PHOSPHORUS: Phosphorus: 3.5 mg/dL (ref 2.5–4.6)

## 2022-06-23 MED ORDER — FOLIC ACID 1 MG PO TABS
1.0000 mg | ORAL_TABLET | Freq: Every day | ORAL | Status: DC
  Administered 2022-06-24 – 2022-07-02 (×9): 1 mg via ORAL
  Filled 2022-06-23 (×9): qty 1

## 2022-06-23 MED ORDER — POTASSIUM CHLORIDE 10 MEQ/100ML IV SOLN
10.0000 meq | Freq: Once | INTRAVENOUS | Status: AC
  Administered 2022-06-23: 10 meq via INTRAVENOUS
  Filled 2022-06-23: qty 100

## 2022-06-23 MED ORDER — ADULT MULTIVITAMIN W/MINERALS CH
1.0000 | ORAL_TABLET | Freq: Every day | ORAL | Status: DC
  Administered 2022-06-24 – 2022-07-02 (×9): 1 via ORAL
  Filled 2022-06-23 (×9): qty 1

## 2022-06-23 MED ORDER — PROPRANOLOL HCL 10 MG PO TABS
10.0000 mg | ORAL_TABLET | Freq: Two times a day (BID) | ORAL | Status: DC
  Administered 2022-06-23 – 2022-07-02 (×17): 10 mg via ORAL
  Filled 2022-06-23 (×18): qty 1

## 2022-06-23 MED ORDER — LIDOCAINE HCL 1 % IJ SOLN
INTRAMUSCULAR | Status: AC
  Filled 2022-06-23: qty 20

## 2022-06-23 MED ORDER — SODIUM CHLORIDE 0.9 % IV SOLN
2.0000 g | Freq: Three times a day (TID) | INTRAVENOUS | Status: DC
  Filled 2022-06-23 (×2): qty 40

## 2022-06-23 MED ORDER — MORPHINE SULFATE (PF) 4 MG/ML IV SOLN
4.0000 mg | Freq: Once | INTRAVENOUS | Status: AC
  Administered 2022-06-23: 4 mg via INTRAVENOUS
  Filled 2022-06-23: qty 1

## 2022-06-23 MED ORDER — IOHEXOL 350 MG/ML SOLN
75.0000 mL | Freq: Once | INTRAVENOUS | Status: AC | PRN
  Administered 2022-06-23: 75 mL via INTRAVENOUS

## 2022-06-23 MED ORDER — ONDANSETRON HCL 4 MG/2ML IJ SOLN
4.0000 mg | Freq: Four times a day (QID) | INTRAMUSCULAR | Status: DC | PRN
  Administered 2022-06-24: 4 mg via INTRAVENOUS
  Filled 2022-06-23: qty 2

## 2022-06-23 MED ORDER — MAGNESIUM SULFATE 2 GM/50ML IV SOLN
2.0000 g | Freq: Once | INTRAVENOUS | Status: AC
  Administered 2022-06-23: 2 g via INTRAVENOUS
  Filled 2022-06-23: qty 50

## 2022-06-23 MED ORDER — SODIUM CHLORIDE 0.9 % IV SOLN
1.0000 g | Freq: Once | INTRAVENOUS | Status: DC
  Filled 2022-06-23: qty 1

## 2022-06-23 MED ORDER — LACTULOSE 10 GM/15ML PO SOLN
30.0000 g | Freq: Four times a day (QID) | ORAL | Status: DC
  Administered 2022-06-23 – 2022-06-24 (×3): 30 g via ORAL
  Filled 2022-06-23 (×3): qty 60

## 2022-06-23 MED ORDER — ONDANSETRON HCL 4 MG PO TABS
4.0000 mg | ORAL_TABLET | Freq: Four times a day (QID) | ORAL | Status: DC | PRN
  Administered 2022-06-24: 4 mg via ORAL
  Filled 2022-06-23: qty 1

## 2022-06-23 MED ORDER — THIAMINE HCL 100 MG/ML IJ SOLN
100.0000 mg | Freq: Every day | INTRAMUSCULAR | Status: DC
  Filled 2022-06-23: qty 2

## 2022-06-23 MED ORDER — SODIUM CHLORIDE 0.9 % IV SOLN: 1.0000 g | Freq: Once | INTRAVENOUS | Status: DC

## 2022-06-23 MED ORDER — THIAMINE MONONITRATE 100 MG PO TABS
100.0000 mg | ORAL_TABLET | Freq: Every day | ORAL | Status: DC
  Administered 2022-06-24 – 2022-07-02 (×9): 100 mg via ORAL
  Filled 2022-06-23 (×9): qty 1

## 2022-06-23 MED ORDER — SPIRONOLACTONE 25 MG PO TABS
100.0000 mg | ORAL_TABLET | Freq: Two times a day (BID) | ORAL | Status: DC
  Filled 2022-06-23: qty 4

## 2022-06-23 MED ORDER — LORAZEPAM 2 MG/ML IJ SOLN: 1.0000 mg | INTRAMUSCULAR | Status: AC | PRN

## 2022-06-23 MED ORDER — SODIUM CHLORIDE 0.9 % IV SOLN: 2.0000 g | Freq: Once | INTRAVENOUS | Status: DC

## 2022-06-23 MED ORDER — SODIUM CHLORIDE 0.9 % IV SOLN
1.0000 g | Freq: Three times a day (TID) | INTRAVENOUS | Status: AC
  Administered 2022-06-23 – 2022-07-01 (×25): 1 g via INTRAVENOUS
  Filled 2022-06-23 (×27): qty 20

## 2022-06-23 MED ORDER — LORAZEPAM 1 MG PO TABS: 1.0000 mg | ORAL_TABLET | ORAL | Status: AC | PRN

## 2022-06-23 MED ORDER — FERROUS SULFATE 325 (65 FE) MG PO TABS
325.0000 mg | ORAL_TABLET | Freq: Every day | ORAL | Status: DC
  Administered 2022-06-24 – 2022-07-02 (×9): 325 mg via ORAL
  Filled 2022-06-23 (×9): qty 1

## 2022-06-23 MED ORDER — PANTOPRAZOLE SODIUM 40 MG PO TBEC
40.0000 mg | DELAYED_RELEASE_TABLET | Freq: Every day | ORAL | Status: DC
  Administered 2022-06-24 – 2022-07-02 (×9): 40 mg via ORAL
  Filled 2022-06-23 (×9): qty 1

## 2022-06-23 MED ORDER — FUROSEMIDE 20 MG PO TABS
20.0000 mg | ORAL_TABLET | Freq: Every day | ORAL | Status: DC
  Filled 2022-06-23 (×2): qty 1

## 2022-06-23 MED ORDER — SODIUM BICARBONATE 650 MG PO TABS
650.0000 mg | ORAL_TABLET | Freq: Two times a day (BID) | ORAL | Status: DC
  Administered 2022-06-23 – 2022-07-02 (×18): 650 mg via ORAL
  Filled 2022-06-23 (×19): qty 1

## 2022-06-23 MED ORDER — ONDANSETRON HCL 4 MG/2ML IJ SOLN
4.0000 mg | Freq: Once | INTRAMUSCULAR | Status: AC
  Administered 2022-06-23: 4 mg via INTRAVENOUS
  Filled 2022-06-23: qty 2

## 2022-06-23 NOTE — ED Provider Notes (Signed)
[place] [facility] AT [facility] Provider Note   CSN: [id number] Arrival date & time: 06/23/22  1037     History  Chief Complaint  Patient presents with   Abdominal Pain    Mathew [name] is a 42 y.o. male.  Pt is a 42 yo male with pmhx significant for alcoholic cirrhosis, SBP, and etoh abuse.  Pt was admitted from 5/20-27 for sepsis due to SBP.  He did have a paracentesis which grew out ESBL e. Coli.  Pt said his abd started to hurt again yesterday.  He denies any fevers.  No n/v.         Home Medications Prior to Admission medications   Medication Sig Start Date End Date Taking? Authorizing Provider  ferrous sulfate 325 (65 FE) MG tablet Take 325 mg by mouth daily with breakfast.   Yes [provider]  folic acid (FOLVITE) 1 MG tablet Take 1 mg by mouth daily.   Yes [provider]  furosemide (LASIX) 20 MG tablet Take 1 tablet (20 mg total) by mouth daily. 06/08/22  Yes [name], MD  lactulose (CHRONULAC) 10 GM/15ML solution Take 45 mLs (30 g total) by mouth every 6 (six) hours. 06/08/22  Yes [name], MD  pantoprazole (PROTONIX) 40 MG tablet Take 1 tablet (40 mg total) by mouth daily at 6 (six) AM. 06/06/22  Yes Rhetta Mura, MD  propranolol (INDERAL) 10 MG tablet Take 10 mg by mouth 2 (two) times daily.   Yes [provider]  sodium bicarbonate 650 MG tablet Take 1 tablet (650 mg total) by mouth 2 (two) times daily. 06/08/22  Yes [name], MD  spironolactone (ALDACTONE) 100 MG tablet Take 100 mg by mouth 2 (two) times daily.   Yes [provider]  ciprofloxacin (CIPRO) 500 MG tablet Take 1/2 tablet (250 mg total) by mouth daily with breakfast. Patient not taking: Reported on 06/23/2022 06/06/22 06/01/23  Rhetta Mura, MD  spironolactone (ALDACTONE) 50 MG tablet Take 1 tablet (50 mg total) by mouth daily. Patient not taking: Reported on 06/23/2022 06/08/22   [name], MD      Allergies    Patient has no known allergies.    Review of Systems   Review of Systems  Gastrointestinal:  Positive for abdominal distention and abdominal pain.  All other systems reviewed and are negative.   Physical Exam Updated Vital Signs BP 129/83 (BP Location: Right Arm)   Pulse 90   Temp 99 F (37.2 C) (Oral)   Resp (!) 24   Ht 5\' 1"  (1.549 m)   Wt 77 kg   SpO2 100%   BMI 32.07 kg/m  Physical Exam Vitals and nursing note reviewed.  Constitutional:      Appearance: He is well-developed. He is ill-appearing.  HENT:     Head: Normocephalic and atraumatic.     Mouth/Throat:     Mouth: Mucous membranes are dry.  Eyes:     Extraocular Movements: Extraocular movements intact.     Pupils: Pupils are equal, round, and reactive to light.  Cardiovascular:     Rate and Rhythm: Normal rate and regular rhythm.  Pulmonary:     Effort: Pulmonary effort is normal.     Breath sounds: Normal breath sounds.  Abdominal:     Palpations: There is fluid wave.     Tenderness: There is generalized abdominal tenderness.     Hernia: A hernia is present. Hernia is present  in the umbilical area.     Comments: Reducible umbilical hernia  Skin:    General: Skin is warm.     Capillary Refill: Capillary refill takes less than 2 seconds.  Neurological:     General: No focal deficit present.     Mental Status: He is alert and oriented to person, place, and time.  Psychiatric:        Mood and Affect: Mood normal.        Behavior: Behavior normal.     ED Results / Procedures / Treatments   Labs (all labs ordered are listed, but only abnormal results are displayed) Labs Reviewed  CBC WITH DIFFERENTIAL/PLATELET - Abnormal; Notable for the following components:      Result Value   WBC 2.1 (*)    RBC 2.95 (*)    Hemoglobin 10.5 (*)    HCT 31.8 (*)    MCV 107.8 (*)    MCH 35.6 (*)    Platelets 35 (*)    Neutro Abs 1.0 (*)    All other components within normal  limits  COMPREHENSIVE METABOLIC PANEL - Abnormal; Notable for the following components:   Potassium 3.3 (*)    CO2 15 (*)    Glucose, Bld 105 (*)    BUN <5 (*)    Calcium 7.7 (*)    Total Protein 5.4 (*)    Albumin 2.6 (*)    AST 43 (*)    Total Bilirubin 3.8 (*)    All other components within normal limits  ETHANOL - Abnormal; Notable for the following components:   Alcohol, Ethyl (B) 102 (*)    All other components within normal limits  AMMONIA - Abnormal; Notable for the following components:   Ammonia 42 (*)    All other components within normal limits  MAGNESIUM - Abnormal; Notable for the following components:   Magnesium 1.6 (*)    All other components within normal limits  I-STAT VENOUS BLOOD GAS, ED - Abnormal; Notable for the following components:   pCO2, Ven 30.0 (*)    pO2, Ven 66 (*)    Bicarbonate 17.9 (*)    TCO2 19 (*)    Acid-base deficit 6.0 (*)    Potassium 3.4 (*)    Calcium, Ion 1.01 (*)    HCT 29.0 (*)    Hemoglobin 9.9 (*)    All other components within normal limits  LIPASE, BLOOD  URINALYSIS, ROUTINE W REFLEX MICROSCOPIC  PROTIME-INR    EKG None  Radiology CT ABDOMEN PELVIS W CONTRAST  Result Date: 06/23/2022 CLINICAL DATA:  Abdominal pain with nausea vomiting. History of liver failure. EXAM: CT ABDOMEN AND PELVIS WITH CONTRAST TECHNIQUE: Multidetector CT imaging of the abdomen and pelvis was performed using the standard protocol following bolus administration of intravenous contrast. RADIATION DOSE REDUCTION: This exam was performed according to the departmental dose-optimization program which includes automated exposure control, adjustment of the mA and/or kV according to patient size and/or use of iterative reconstruction technique. CONTRAST:  75mL OMNIPAQUE IOHEXOL 350 MG/ML SOLN COMPARISON:  Ultrasound 06/02/2022.  CT 10/29/2018. FINDINGS: Lower chest: Small left and tiny right pleural effusion with adjacent bandlike opacities. Atelectasis is  favored. Breathing motion at the lung bases. Gynecomastia. Trace pericardial Hepatobiliary: Small fatty liver with nodular contour consistent with the history of chronic liver disease. No obvious enhancing liver lesion gallbladder is mildly distended with dependent stones. Patent portal vein. Diameter of the main portal vein at the liver hilum measuring 17 mm. Pancreas:  Unremarkable. No pancreatic ductal dilatation or surrounding inflammatory changes. Spleen: Normal in size without focal abnormality. Adrenals/Urinary Tract: Adrenal glands are unremarkable. Kidneys are normal, without renal calculi, focal lesion, or hydronephrosis. Bladder is underdistended but has diffuse wall thickening. Stomach/Bowel: No oral contrast. The large bowel is of normal course and caliber with diffuse colonic wall thickening. The wall thickening is more than expected for some please hepatic biliary ascites and is increased from previous. Please correlate for an underlying colitis. The stomach and small bowel are nondilated. These loops also have some wall thickening but is more mild. Normal appendix. Vascular/Lymphatic: Aortic atherosclerosis. No enlarged abdominal or pelvic lymph nodes. Reproductive: Prostate is unremarkable. Presumed left testicle in the inguinal canal. Other: Amount of ascites. No free air. Ascites is extending to the small umbilical hernia. Musculoskeletal: Mild degenerative changes. IMPRESSION: Again changes of chronic liver disease with nodular fatty liver with significant ascites. Patent portal vein. No enhancing liver lesion. There is multifocal areas of wall thickening along the bowel which is nondilated. However the wall thickening along the colon appears out of proportion and increased from previous. Underlying colitis is possible. Gallstones. Small pleural effusions, left-greater-than-right. Electronically Signed   By: Karen Kays M.D.   On: 06/23/2022 13:03    Procedures Procedures    Medications  Ordered in ED Medications  potassium chloride 10 mEq in 100 mL IVPB (10 mEq Intravenous New Bag/Given 06/23/22 1229)  magnesium sulfate IVPB 2 g 50 mL (has no administration in time range)  ondansetron (ZOFRAN) injection 4 mg (4 mg Intravenous Given 06/23/22 1059)  morphine (PF) 4 MG/ML injection 4 mg (4 mg Intravenous Given 06/23/22 1100)  iohexol (OMNIPAQUE) 350 MG/ML injection 75 mL (75 mLs Intravenous Contrast Given 06/23/22 1209)    ED Course/ Medical Decision Making/ A&P Clinical Course as of 06/24/22 0657  Thu Jun 23, 2022  1628 Received sign out pending paracentesis with IR. Presenting with abdominal pain. Has history of SBP. Will re-assess [WS]  1905 Ascitic fluid with 9200 nucleated cells of which 85% are neutrophils. Meets criteria for SBP. Will treat with antibiotics and admit [WS]  1908 Previously grew ESBL E coli. Will give ertapenenem [WS]  1939 Discussed with the hospitalist who will admit patient. [WS]    Clinical Course User Index [WS] Lonell Grandchild, MD                             Medical Decision Making Amount and/or Complexity of Data Reviewed Labs: ordered. Radiology: ordered.  Risk Prescription drug management. Decision regarding hospitalization.   This patient presents to the ED for concern of abd pain, this involves an extensive number of treatment options, and is a complaint that carries with it a high risk of complications and morbidity.  The differential diagnosis includes sbp, other infection, hepatic enceph   Co morbidities that complicate the patient evaluation  alcoholic cirrhosis, SBP, and etoh abuse   Additional history obtained:  Additional history obtained from epic chart review External records from outside source obtained and reviewed including EMS report   Lab Tests:  I Ordered, and personally interpreted labs.  The pertinent results include:  cbc with wbc low at 2.1, hgb low at 10.5, plt low at 35 (all chronically low); etoh  102; mg low at 1.6, cmp with k low at 3.3, CO@ low at 15, TB 3.8; lipase nl   Imaging Studies ordered:  I ordered imaging studies including ct abd/pelvis  I independently visualized and interpreted imaging which showed IMPRESSION:  Again changes of chronic liver disease with nodular fatty liver with  significant ascites. Patent portal vein. No enhancing liver lesion.   There is multifocal areas of wall thickening along the bowel which  is nondilated. However the wall thickening along the colon appears  out of proportion and increased from previous. Underlying colitis is  possible.   Gallstones.   Small pleural effusions, left-greater-than-right.  I agree with the radiologist interpretation   Cardiac Monitoring:  The patient was maintained on a cardiac monitor.  I personally viewed and interpreted the cardiac monitored which showed an underlying rhythm of: nsr   Medicines ordered and prescription drug management:  I ordered medication including morphine/zofran  for sx  Reevaluation of the patient after these medicines showed that the patient improved I have reviewed the patients home medicines and have made adjustments as needed   Test Considered:  ct   Critical Interventions:  paracentesis   Consultations Obtained:  I requested consultation with  IR (Dr. Lowella Dandy),  and discussed lab and imaging findings as well as pertinent plan - he will have someone do a paracentesis. This is pending at shift change.  Problem List / ED Course:  Abd pain: pending paracentesis.  I suspect SBP. Hypokalemia and hypomagnesemia:  relpaced   Reevaluation:  After the interventions noted above, I reevaluated the patient and found that they have :improved   Social Determinants of Health:  Spanish speaker, no insurance   Dispostion:  After consideration of the diagnostic results and the patients response to treatment, I feel that the patent would benefit from admission.           Final Clinical Impression(s) / ED Diagnoses Final diagnoses:  Pancytopenia (HCC)  Hypokalemia  Hypomagnesemia  Alcoholic cirrhosis, unspecified whether ascites present (HCC)  ETOH abuse    Rx / DC Orders ED Discharge Orders     None         Jacalyn Lefevre, MD 06/24/22 [phone]

## 2022-06-23 NOTE — ED Notes (Signed)
Pt taken to and from restroom via wheelchair.

## 2022-06-23 NOTE — H&P (Addendum)
PCP:   [name], MD   Chief Complaint:  Abdominal pain  HPI: This is a 42 year old male with past medical history of ESBL bacterial peritonitis, alcoholic liver cirrhosis, pancytopenia, thrombocytopenia, tobacco abuse.  Yesterday he developed mild abdominal pains, these became progressively worse and was severe today.  He was unable to go to work and came to the ER.  He endorses being weak, nauseous and vomiting today.  He also admits endorses being lightheaded and dizzy since yesterday.  He denies fever, chills, or hematemesis.  He states he seldom drinks.  This weekend he had some 12 ounces of beer.  Patient recently admitted 5/20 to 06/06/2022.  He was diagnosed with ESBL UTI.  At the time of discharge he was directed to take Cipro 25 mg p.o. 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  On reviewing patient's medication, it does not appear patient has been taking antibiotics.  In the ER ultrasound-guided paracentesis done.  Fluid was cloudy.  Total nucleated cell count 9, 210,  neutrophils 25.  Alcohol level 102 Tmax 100.1, HR 126 currently improved down to 110.  BP stable, RR 24 at presentation, currently 17.  Cultures collected.  WBC is 2.1, hemoglobin 10.5, platelets 35.  INR 2.8.  Patient given meropenem.  CT abdomen pelvis showed Again changes of chronic liver disease with nodular fatty liver with significant ascites. Patent portal vein. No enhancing liver lesion.   There is multifocal areas of wall thickening along the bowel which is nondilated. However the wall thickening along the colon appears out of proportion and increased from previous. Underlying colitis is possible.  Patient Spanish-speaking, translation line used.  Review of Systems:  Per HPI  Past Medical History: Past Medical History:  Diagnosis Date   Acute metabolic encephalopathy 06/20/2021   Alcohol withdrawal seizure (HCC)    Cirrhosis (HCC)    ETOH abuse    Pneumonia 04/02/2020   SBP  (spontaneous bacterial peritonitis) (HCC) 06/17/2021   Past Surgical History:  Procedure Laterality Date   COLONOSCOPY WITH PROPOFOL N/A 03/17/2018   Procedure: COLONOSCOPY WITH PROPOFOL;  Surgeon: Bernette Redbird, MD;  Location[facility] ENDOSCOPY;  Service: Endoscopy;  Laterality: N/A;   ESOPHAGOGASTRODUODENOSCOPY (EGD) WITH PROPOFOL N/A 03/17/2018   Procedure: ESOPHAGOGASTRODUODENOSCOPY (EGD) WITH PROPOFOL;  Surgeon: Bernette Redbird, MD;  Location: [facility] ENDOSCOPY;  Service: Endoscopy;  Laterality: N/A;   IR PARACENTESIS  03/14/2018   IR PARACENTESIS  11/18/2020   IR PARACENTESIS  06/17/2021   IR PARACENTESIS  08/30/2021   IR PARACENTESIS  09/02/2021   IR PARACENTESIS  11/16/2021   IR PARACENTESIS  03/01/2022   IR PARACENTESIS  05/31/2022   IR PARACENTESIS  06/02/2022   IR PARACENTESIS  06/23/2022    Medications: Prior to Admission medications   Medication Sig Start Date End Date Taking? Authorizing Provider  ferrous sulfate 325 (65 FE) MG tablet Take 325 mg by mouth daily with breakfast.   Yes [provider]  folic acid (FOLVITE) 1 MG tablet Take 1 mg by mouth daily.   Yes [provider]  furosemide (LASIX) 20 MG tablet Take 1 tablet (20 mg total) by mouth daily. 06/08/22  Yes [name], MD  lactulose (CHRONULAC) 10 GM/15ML solution Take 45 mLs (30 g total) by mouth every 6 (six) hours. 06/08/22  Yes [name], MD  pantoprazole (PROTONIX) 40 MG tablet Take 1 tablet (40 mg total) by mouth daily at 6 (six) AM. 06/06/22  Yes Rhetta Mura, MD  propranolol (INDERAL) 10 MG tablet  Take 10 mg by mouth 2 (two) times daily.   Yes [provider]  sodium bicarbonate 650 MG tablet Take 1 tablet (650 mg total) by mouth 2 (two) times daily. 06/08/22  Yes [name], MD  spironolactone (ALDACTONE) 100 MG tablet Take 100 mg by mouth 2 (two) times daily.   Yes [provider]  ciprofloxacin (CIPRO) 500 MG tablet Take 1/2 tablet (250 mg total) by mouth daily  with breakfast. Patient not taking: Reported on 06/23/2022 06/06/22 06/01/23  Rhetta Mura, MD  spironolactone (ALDACTONE) 50 MG tablet Take 1 tablet (50 mg total) by mouth daily. Patient not taking: Reported on 06/23/2022 06/08/22   [name], MD    Allergies:  No Known Allergies  Social History:  reports that he has been smoking cigarettes. He started smoking about 2 years ago. He has been smoking an average of .25 packs per day. He has never used smokeless tobacco. He reports current alcohol use of about 2.0 standard drinks of alcohol per week. He reports that he does not use drugs.  Family History: Family History  Adopted: Yes  Problem Relation Age of Onset   Cancer Neg Hx    Heart disease Neg Hx     Physical Exam: Vitals:   06/23/22 1703 06/23/22 1715 06/23/22 1800 06/23/22 1845  BP:  120/68 116/70 122/74  Pulse:  (!) 112 (!) 108 (!) 111  Resp:  (!) 25 14 13   Temp: 99.1 F (37.3 C)     TempSrc:      SpO2:  100% 94% 95%  Weight:      Height:        General:  Alert and oriented times three, well developed and nourished, no acute distress Eyes: Pink conjunctiva, no scleral icterus ENT: Moist oral mucosa, neck supple, no thyromegaly Lungs: clear to ascultation, no wheeze, no crackles, no use of accessory muscles Cardiovascular: regular rate and rhythm, no regurgitation, no gallops, no murmurs. No carotid bruits, no JVD Abdomen: soft, positive BS, distended with ascites, large umbilical hernia that is mildly tender, mild generalized TTP, not an acute abdomen GU: not examined Neuro: CN II - XII grossly intact, sensation intact Musculoskeletal: strength 5/5 all extremities, no clubbing, cyanosis or edema Skin: Small scattered spider nevi, no jaundice Psych: appropriate patient   Labs on Admission:  Recent Labs    06/23/22 1053 06/23/22 1152  NA 137 140  K 3.3* 3.4*  CL 108  --   CO2 15*  --   GLUCOSE 105*  --   BUN <5*  --   CREATININE 0.67  --    CALCIUM 7.7*  --   MG 1.6*  --    Recent Labs    06/23/22 1053  AST 43*  ALT 18  ALKPHOS 92  BILITOT 3.8*  PROT 5.4*  ALBUMIN 2.6*   Recent Labs    06/23/22 1053  LIPASE 32   Recent Labs    06/23/22 1053 06/23/22 1152  WBC 2.1*  --   NEUTROABS 1.0*  --   HGB 10.5* 9.9*  HCT 31.8* 29.0*  MCV 107.8*  --   PLT 35*  --      Micro Results: Recent Results (from the past 240 hour(s))  Body fluid culture w Gram Stain     Status: None (Preliminary result)   Collection Time: 06/23/22  4:50 PM   Specimen: Abdomen; Peritoneal Fluid  Result Value Ref Range Status   Specimen Description ABDOMEN PERITONEAL  Final  Special Requests NONE  Final   Gram Stain   Final    RARE WBC PRESENT,BOTH PMN AND MONONUCLEAR NO ORGANISMS SEEN Performed at [facility], [address], [place], [place] [address]    Culture PENDING  Incomplete   Report Status PENDING  Incomplete     Radiological Exams on Admission: IR Paracentesis  Result Date: 06/23/2022 INDICATION: Patient with history of ETOH cirrhosis, recurrent ascites, recurrent SBP. Patient currently in the ED with abdominal pain and concern for SBP. Request for diagnostic and therapeutic paracentesis. EXAM: ULTRASOUND GUIDED DIAGNOSTIC AND THERAPEUTIC PARACENTESIS MEDICATIONS: 6 mL 1% lidocaine COMPLICATIONS: None immediate. PROCEDURE: Informed written consent was obtained from the patient after a discussion of the risks, benefits and alternatives to treatment. A timeout was performed prior to the initiation of the procedure. Initial ultrasound scanning demonstrates a large amount of ascites within the left lower abdominal quadrant. The left lower abdomen was prepped and draped in the usual sterile fashion. 1% lidocaine was used for local anesthesia. Following this, a 19 gauge, 7-cm, Yueh catheter was introduced. An ultrasound image was saved for documentation purposes. The paracentesis was performed. The catheter was removed and a  dressing was applied. The patient tolerated the procedure well without immediate post procedural complication. FINDINGS: A total of approximately 6.3 L of hazy, dark yellow fluid was removed. IMPRESSION: Successful ultrasound-guided paracentesis yielding 6.3 liters of peritoneal fluid. Performed by Lynnette Caffey, PA-C Electronically Signed   By: Richarda Overlie M.D.   On: 06/23/2022 18:33   CT ABDOMEN PELVIS W CONTRAST  Result Date: 06/23/2022 CLINICAL DATA:  Abdominal pain with nausea vomiting. History of liver failure. EXAM: CT ABDOMEN AND PELVIS WITH CONTRAST TECHNIQUE: Multidetector CT imaging of the abdomen and pelvis was performed using the standard protocol following bolus administration of intravenous contrast. RADIATION DOSE REDUCTION: This exam was performed according to the departmental dose-optimization program which includes automated exposure control, adjustment of the mA and/or kV according to patient size and/or use of iterative reconstruction technique. CONTRAST:  75mL OMNIPAQUE IOHEXOL 350 MG/ML SOLN COMPARISON:  Ultrasound 06/02/2022.  CT 10/29/2018. FINDINGS: Lower chest: Small left and tiny right pleural effusion with adjacent bandlike opacities. Atelectasis is favored. Breathing motion at the lung bases. Gynecomastia. Trace pericardial Hepatobiliary: Small fatty liver with nodular contour consistent with the history of chronic liver disease. No obvious enhancing liver lesion gallbladder is mildly distended with dependent stones. Patent portal vein. Diameter of the main portal vein at the liver hilum measuring 17 mm. Pancreas: Unremarkable. No pancreatic ductal dilatation or surrounding inflammatory changes. Spleen: Normal in size without focal abnormality. Adrenals/Urinary Tract: Adrenal glands are unremarkable. Kidneys are normal, without renal calculi, focal lesion, or hydronephrosis. Bladder is underdistended but has diffuse wall thickening. Stomach/Bowel: No oral contrast. The large  bowel is of normal course and caliber with diffuse colonic wall thickening. The wall thickening is more than expected for some please hepatic biliary ascites and is increased from previous. Please correlate for an underlying colitis. The stomach and small bowel are nondilated. These loops also have some wall thickening but is more mild. Normal appendix. Vascular/Lymphatic: Aortic atherosclerosis. No enlarged abdominal or pelvic lymph nodes. Reproductive: Prostate is unremarkable. Presumed left testicle in the inguinal canal. Other: Amount of ascites. No free air. Ascites is extending to the small umbilical hernia. Musculoskeletal: Mild degenerative changes. IMPRESSION: Again changes of chronic liver disease with nodular fatty liver with significant ascites. Patent portal vein. No enhancing liver lesion. There is multifocal areas of  wall thickening along the bowel which is nondilated. However the wall thickening along the colon appears out of proportion and increased from previous. Underlying colitis is possible. Gallstones. Small pleural effusions, left-greater-than-right. Electronically Signed   By: Karen Kays M.D.   On: 06/23/2022 13:03    Assessment/Plan Present on Admission:  SBP (spontaneous bacterial peritonitis) (HCC) s/t Decompensated Hepatic Cirrhosis/ Alcoholic cirrhosis of liver with ascites (HCC) Recent ESBL E. coli bacterial peritonitis -Paracentesis done in the ER, 6.3 L fluid removed -Cultures and Gram stain pending -Meropenem scheduled -Contact isolation -Zofran as needed nausea -Patient re-educated on the importance of taking his Cipro daily   Alcohol use disorder -Alcohol level 102 -CIWA initiated -Continue thiamine, folate, MVI   Cirrhosis of liver (HCC) -Ammonia level slightly elevated 42 -INR 2.8 -Platelets 35   Hypokalemia/hypocalcemia -Repleting p.o. and IV -BMP, magnesium level in a.m.   Hypomagnesemia -Repleting IV   Edema/ascites due to hypoalbuminemia and  cirrhotic liver -Improved compared to prior.  Patient maintained on Lasix   Pancytopenia (HCC)/ Thrombocytopenia (HCC) -Secondary to liver cirrhosis -Patient denies bleeding in stool, emesis or gums -Transfuse platelets if less than 20 or if patient bleeding. 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 Matthias Hughs  High risk for poor prognosis--MELD score portends 30 to 40% 18-month mortality -Maintained on lifelong ciprofloxacin, Lasix Aldactone 50: 20 ratio as well as bicarb -Palliative care/[facility] to follow patient in the outpatient as he is DNR  Yarelie Hams 06/23/2022, 7:49 PM

## 2022-06-23 NOTE — Procedures (Signed)
PROCEDURE SUMMARY:  Successful ultrasound guided paracentesis from the left lower quadrant.  Yielded 6.3 L of hazy dark yellow fluid.  No immediate complications.  The patient tolerated the procedure well.   Specimen was sent for labs.  EBL < 5mL  The patient has previously been formally evaluated by the [facility] and is being actively followed for potential future intervention.  Per Dr. Jerrye Noble evaluation 06/06/22, "Formal consult for TIPS could be considered and would likely greatly benefit the patient should diuretic maximization be unsuccessful in controlling ascites and if the patient can remain abstinent from alcohol use.  This evaluation was sent to the patient's PCP Dr. Shan Levans and Annamarie Major, NP with whom the patient has an upcoming appointment."   Lynnette Caffey, PA-C

## 2022-06-23 NOTE — Telephone Encounter (Signed)
FYI - patient needed labs this week, however we have not been able to reach him.  I mailed him a letter but I see now that he is at the ED today and has had labs.

## 2022-06-23 NOTE — ED Triage Notes (Signed)
Pt to the ed from home with a CC of abd pain, with nausea, vomiting, pt is distended and relays he has liver failure. Pt required spanish interpreter.

## 2022-06-23 NOTE — ED Notes (Signed)
ED TO INPATIENT HANDOFF REPORT  ED Nurse Name and Phone #: [phone]  S Name/Age/Gender Mathew [name] 42 y.o. male Room/Bed: 004C/004C  Code Status   Code Status: DNR  Home/SNF/Other Home Patient oriented to: self, place, time, and situation Is this baseline? Yes   Triage Complete: Triage complete  Chief Complaint SBP (spontaneous bacterial peritonitis) (HCC) [K65.2]  Triage Note Pt to the ed from home with a CC of abd pain, with nausea, vomiting, pt is distended and relays he has liver failure. Pt required spanish interpreter.     Allergies No Known Allergies  Level of Care/Admitting Diagnosis ED Disposition     ED Disposition  Admit   Condition  --   Comment  Hospital Area: [facility] [[id number]]  Level of Care: Telemetry Medical [104]  May admit patient to [name] or [facility] if equivalent level of care is available:: No  Covid Evaluation: Confirmed COVID Negative  Diagnosis: SBP (spontaneous bacterial peritonitis) (HCC) [301095]  Admitting Physician: Alvester Chou  Attending Physician: Gery Pray [[id number]]  Certification:: I certify this patient will need inpatient services for at least 2 midnights  Estimated Length of Stay: 2          B Medical/Surgery History Past Medical History:  Diagnosis Date   Acute metabolic encephalopathy 06/20/2021   Alcohol withdrawal seizure (HCC)    Cirrhosis (HCC)    ETOH abuse    Pneumonia 04/02/2020   SBP (spontaneous bacterial peritonitis) (HCC) 06/17/2021   Past Surgical History:  Procedure Laterality Date   COLONOSCOPY WITH PROPOFOL N/A 03/17/2018   Procedure: COLONOSCOPY WITH PROPOFOL;  Surgeon: Bernette Redbird, MD;  Location: [facility] ENDOSCOPY;  Service: Endoscopy;  Laterality: N/A;   ESOPHAGOGASTRODUODENOSCOPY (EGD) WITH PROPOFOL N/A 03/17/2018   Procedure: ESOPHAGOGASTRODUODENOSCOPY (EGD) WITH PROPOFOL;  Surgeon: Bernette Redbird, MD;  Location: [facility] ENDOSCOPY;  Service:  Endoscopy;  Laterality: N/A;   IR PARACENTESIS  03/14/2018   IR PARACENTESIS  11/18/2020   IR PARACENTESIS  06/17/2021   IR PARACENTESIS  08/30/2021   IR PARACENTESIS  09/02/2021   IR PARACENTESIS  11/16/2021   IR PARACENTESIS  03/01/2022   IR PARACENTESIS  05/31/2022   IR PARACENTESIS  06/02/2022   IR PARACENTESIS  06/23/2022     A IV Location/Drains/Wounds Patient Lines/Drains/Airways Status     Active Line/Drains/Airways     Name Placement date Placement time Site Days   Peripheral IV 06/02/22 20 G Anterior;Left Forearm 06/02/22  1608  Forearm  21   Peripheral IV 06/03/22 Anterior;Proximal;Right Forearm 06/03/22  0900  Forearm  20   Peripheral IV 06/23/22 20 G Right Antecubital 06/23/22  1059  Antecubital  less than 1            Intake/Output Last 24 hours No intake or output data in the 24 hours ending 06/23/22 2110  Labs/Imaging Results for orders placed or performed during the hospital encounter of 06/23/22 (from the past 48 hour(s))  CBC with Differential     Status: Abnormal   Collection Time: 06/23/22 10:53 AM  Result Value Ref Range   WBC 2.1 (L) 4.0 - 10.5 K/uL   RBC 2.95 (L) 4.22 - 5.81 MIL/uL   Hemoglobin 10.5 (L) 13.0 - 17.0 g/dL   HCT 45.4 (L) 09.8 - 11.9 %   MCV 107.8 (H) 80.0 - 100.0 fL   MCH 35.6 (H) 26.0 - 34.0 pg   MCHC 33.0 30.0 - 36.0 g/dL   RDW 14.7 82.9 - 56.2 %  Platelets 35 (L) 150 - 400 K/uL    Comment: Immature Platelet Fraction may be clinically indicated, consider ordering this additional test [id number] REPEATED TO VERIFY    nRBC 0.0 0.0 - 0.2 %   Neutrophils Relative % 45 %   Neutro Abs 1.0 (L) 1.7 - 7.7 K/uL   Lymphocytes Relative 47 %   Lymphs Abs 1.0 0.7 - 4.0 K/uL   Monocytes Relative 6 %   Monocytes Absolute 0.1 0.1 - 1.0 K/uL   Eosinophils Relative 1 %   Eosinophils Absolute 0.0 0.0 - 0.5 K/uL   Basophils Relative 0 %   Basophils Absolute 0.0 0.0 - 0.1 K/uL   Immature Granulocytes 1 %   Abs Immature Granulocytes 0.01 0.00 -  0.07 K/uL    Comment: Performed at [facility], [address], [place], [place] [address]  Comprehensive metabolic panel     Status: Abnormal   Collection Time: 06/23/22 10:53 AM  Result Value Ref Range   Sodium 137 135 - 145 mmol/L   Potassium 3.3 (L) 3.5 - 5.1 mmol/L   Chloride 108 98 - 111 mmol/L   CO2 15 (L) 22 - 32 mmol/L   Glucose, Bld 105 (H) 70 - 99 mg/dL    Comment: Glucose reference range applies only to samples taken after fasting for at least 8 hours.   BUN <5 (L) 6 - 20 mg/dL   Creatinine, Ser 1.19 0.61 - 1.24 mg/dL   Calcium 7.7 (L) 8.9 - 10.3 mg/dL   Total Protein 5.4 (L) 6.5 - 8.1 g/dL   Albumin 2.6 (L) 3.5 - 5.0 g/dL   AST 43 (H) 15 - 41 U/L   ALT 18 0 - 44 U/L   Alkaline Phosphatase 92 38 - 126 U/L   Total Bilirubin 3.8 (H) 0.3 - 1.2 mg/dL   GFR, Estimated >14 >78 mL/min    Comment: (NOTE) Calculated using the CKD-EPI Creatinine Equation (2021)    Anion gap 14 5 - 15    Comment: Performed at [facility], [address], [place], [place] [address]  Lipase, blood     Status: None   Collection Time: 06/23/22 10:53 AM  Result Value Ref Range   Lipase 32 11 - 51 U/L    Comment: Performed at [facility], [address] [place], [place] [address]  Ethanol     Status: Abnormal   Collection Time: 06/23/22 10:53 AM  Result Value Ref Range   Alcohol, Ethyl (B) 102 (H) <10 mg/dL    Comment: (NOTE) Lowest detectable limit for serum alcohol is 10 mg/dL.  For medical purposes only. Performed at [facility], [address] [place], [place] [address]   Ammonia     Status: Abnormal   Collection Time: 06/23/22 10:53 AM  Result Value Ref Range   Ammonia 42 (H) 9 - 35 umol/L    Comment: Performed at [facility], [address] [place], [place] [address]  Magnesium     Status: Abnormal   Collection Time: 06/23/22 10:53 AM  Result Value Ref Range   Magnesium 1.6 (L) 1.7 - 2.4 mg/dL    Comment: Performed at [facility],  [address], [place], [place] [address]  I-Stat venous blood gas, [facility] ED, [facility], [facility])     Status: Abnormal   Collection Time: 06/23/22 11:52 AM  Result Value Ref Range   pH, Ven 7.384 7.25 - 7.43   pCO2, Ven 30.0 (  L) 44 - 60 mmHg   pO2, Ven 66 (H) 32 - 45 mmHg   Bicarbonate 17.9 (L) 20.0 - 28.0 mmol/L   TCO2 19 (L) 22 - 32 mmol/L   O2 Saturation 93 %   Acid-base deficit 6.0 (H) 0.0 - 2.0 mmol/L   Sodium 140 135 - 145 mmol/L   Potassium 3.4 (L) 3.5 - 5.1 mmol/L   Calcium, Ion 1.01 (L) 1.15 - 1.40 mmol/L   HCT 29.0 (L) 39.0 - 52.0 %   Hemoglobin 9.9 (L) 13.0 - 17.0 g/dL   Sample type VENOUS   Body fluid cell count with differential     Status: Abnormal   Collection Time: 06/23/22  4:00 PM  Result Value Ref Range   Fluid Type-FCT PERITONEAL FLUID NO CYTO     Comment: CORRECTED ON 06/13 AT 1726: PREVIOUSLY REPORTED AS ABDOMEN   Color, Fluid YELLOW YELLOW   Appearance, Fluid CLOUDY (A) CLEAR   Total Nucleated Cell Count, Fluid 9,210 (H) 0 - 1,000 cu mm    Comment: COUNT MAY BE INACCURATE DUE TO FIBRIN CLUMPS.   Neutrophil Count, Fluid 85 (H) 0 - 25 %   Lymphs, Fluid 5 %   Monocyte-Macrophage-Serous Fluid 10 (L) 50 - 90 %   Eos, Fluid 0 %    Comment: Performed at [facility], [address] [place], [place] [address]  Protein, pleural or peritoneal fluid     Status: None   Collection Time: 06/23/22  4:00 PM  Result Value Ref Range   Total protein, fluid <3.0 g/dL    Comment: (NOTE) No normal range established for this test Results should be evaluated in conjunction with serum values    Fluid Type-FTP PERITONEAL FLUID NO CYTO     Comment: Performed at [facility], [address], [place], [place] [address] CORRECTED ON 06/13 AT 1726: PREVIOUSLY REPORTED AS ABDOMEN   Glucose, pleural or peritoneal fluid     Status: None   Collection Time: 06/23/22  4:00 PM  Result Value Ref Range   Glucose, Fluid 79 mg/dL    Comment: (NOTE) No normal range established for this  test Results should be evaluated in conjunction with serum values    Fluid Type-FGLU PERITONEAL FLUID NO CYTO     Comment: Performed at [facility], [address], [place], [place] [address] CORRECTED ON 06/13 AT 1726: PREVIOUSLY REPORTED AS ABDOMEN   Body fluid culture w Gram Stain     Status: None (Preliminary result)   Collection Time: 06/23/22  4:50 PM   Specimen: Abdomen; Peritoneal Fluid  Result Value Ref Range   Specimen Description ABDOMEN PERITONEAL    Special Requests NONE    Gram Stain      RARE WBC PRESENT,BOTH PMN AND MONONUCLEAR NO ORGANISMS SEEN Performed at [facility], [address] [place], [place] [address]    Culture PENDING    Report Status PENDING    IR Paracentesis  Result Date: 06/23/2022 INDICATION: Patient with history of ETOH cirrhosis, recurrent ascites, recurrent SBP. Patient currently in the ED with abdominal pain and concern for SBP. Request for diagnostic and therapeutic paracentesis. EXAM: ULTRASOUND GUIDED DIAGNOSTIC AND THERAPEUTIC PARACENTESIS MEDICATIONS: 6 mL 1% lidocaine COMPLICATIONS: None immediate. PROCEDURE: Informed written consent was obtained from the patient after a discussion of the risks, benefits and alternatives to treatment. A timeout was performed prior to the initiation of the procedure. Initial ultrasound scanning demonstrates a large amount of ascites within  the left lower abdominal quadrant. The left lower abdomen was prepped and draped in the usual sterile fashion. 1% lidocaine was used for local anesthesia. Following this, a 19 gauge, 7-cm, Yueh catheter was introduced. An ultrasound image was saved for documentation purposes. The paracentesis was performed. The catheter was removed and a dressing was applied. The patient tolerated the procedure well without immediate post procedural complication. FINDINGS: A total of approximately 6.3 L of hazy, dark yellow fluid was removed. IMPRESSION: Successful ultrasound-guided  paracentesis yielding 6.3 liters of peritoneal fluid. Performed by Lynnette Caffey, PA-C Electronically Signed   By: Richarda Overlie M.D.   On: 06/23/2022 18:33   CT ABDOMEN PELVIS W CONTRAST  Result Date: 06/23/2022 CLINICAL DATA:  Abdominal pain with nausea vomiting. History of liver failure. EXAM: CT ABDOMEN AND PELVIS WITH CONTRAST TECHNIQUE: Multidetector CT imaging of the abdomen and pelvis was performed using the standard protocol following bolus administration of intravenous contrast. RADIATION DOSE REDUCTION: This exam was performed according to the departmental dose-optimization program which includes automated exposure control, adjustment of the mA and/or kV according to patient size and/or use of iterative reconstruction technique. CONTRAST:  75mL OMNIPAQUE IOHEXOL 350 MG/ML SOLN COMPARISON:  Ultrasound 06/02/2022.  CT 10/29/2018. FINDINGS: Lower chest: Small left and tiny right pleural effusion with adjacent bandlike opacities. Atelectasis is favored. Breathing motion at the lung bases. Gynecomastia. Trace pericardial Hepatobiliary: Small fatty liver with nodular contour consistent with the history of chronic liver disease. No obvious enhancing liver lesion gallbladder is mildly distended with dependent stones. Patent portal vein. Diameter of the main portal vein at the liver hilum measuring 17 mm. Pancreas: Unremarkable. No pancreatic ductal dilatation or surrounding inflammatory changes. Spleen: Normal in size without focal abnormality. Adrenals/Urinary Tract: Adrenal glands are unremarkable. Kidneys are normal, without renal calculi, focal lesion, or hydronephrosis. Bladder is underdistended but has diffuse wall thickening. Stomach/Bowel: No oral contrast. The large bowel is of normal course and caliber with diffuse colonic wall thickening. The wall thickening is more than expected for some please hepatic biliary ascites and is increased from previous. Please correlate for an underlying colitis. The  stomach and small bowel are nondilated. These loops also have some wall thickening but is more mild. Normal appendix. Vascular/Lymphatic: Aortic atherosclerosis. No enlarged abdominal or pelvic lymph nodes. Reproductive: Prostate is unremarkable. Presumed left testicle in the inguinal canal. Other: Amount of ascites. No free air. Ascites is extending to the small umbilical hernia. Musculoskeletal: Mild degenerative changes. IMPRESSION: Again changes of chronic liver disease with nodular fatty liver with significant ascites. Patent portal vein. No enhancing liver lesion. There is multifocal areas of wall thickening along the bowel which is nondilated. However the wall thickening along the colon appears out of proportion and increased from previous. Underlying colitis is possible. Gallstones. Small pleural effusions, left-greater-than-right. Electronically Signed   By: Karen Kays M.D.   On: 06/23/2022 13:03    Pending Labs Unresulted Labs (From admission, onward)     Start     Ordered   06/24/22 0500  Comprehensive metabolic panel  Tomorrow morning,   R        06/23/22 2055   06/24/22 0500  Magnesium  Tomorrow morning,   R        06/23/22 2055   06/24/22 0500  Phosphorus  Tomorrow morning,   R        06/23/22 2055   06/24/22 0500  CBC with Differential/Platelet  Tomorrow morning,   R  06/23/22 2055   06/24/22 0500  Protime-INR  Tomorrow morning,   R        06/23/22 2055   06/23/22 2056  Phosphorus  Add-on,   AD        06/23/22 2056   06/23/22 2054  HIV Antibody (routine testing w rflx)  (HIV Antibody (Routine testing w reflex) panel)  Once,   R        06/23/22 2055   06/23/22 1600  Pathologist smear review  Once,   R        06/23/22 1600   06/23/22 1220  Protime-INR  Once,   STAT        06/23/22 1219   06/23/22 1048  Urinalysis, Routine w reflex microscopic -Urine, Clean Catch  (ED Abdominal Pain)  Once,   URGENT       Question:  Specimen Source  Answer:  Urine, Clean Catch   06/23/22  1047            Vitals/Pain Today's Vitals   06/23/22 1800 06/23/22 1845 06/23/22 2000 06/23/22 2003  BP: 116/70 122/74 124/76   Pulse: (!) 108 (!) 111 (!) 110   Resp: 14 13 19    Temp:      TempSrc:      SpO2: 94% 95% 97%   Weight:      Height:      PainSc:    8     Isolation Precautions Contact precautions  Medications Medications  potassium chloride 10 mEq in 100 mL IVPB (10 mEq Intravenous New Bag/Given 06/23/22 2054)  ondansetron (ZOFRAN) tablet 4 mg (has no administration in time range)    Or  ondansetron (ZOFRAN) injection 4 mg (has no administration in time range)  LORazepam (ATIVAN) tablet 1-4 mg (has no administration in time range)    Or  LORazepam (ATIVAN) injection 1-4 mg (has no administration in time range)  thiamine (VITAMIN B1) tablet 100 mg (has no administration in time range)    Or  thiamine (VITAMIN B1) injection 100 mg (has no administration in time range)  folic acid (FOLVITE) tablet 1 mg (has no administration in time range)  multivitamin with minerals tablet 1 tablet (has no administration in time range)  ferrous sulfate tablet 325 mg (has no administration in time range)  furosemide (LASIX) tablet 20 mg (has no administration in time range)  lactulose (CHRONULAC) 10 GM/15ML solution 30 g (has no administration in time range)  pantoprazole (PROTONIX) EC tablet 40 mg (has no administration in time range)  propranolol (INDERAL) tablet 10 mg (has no administration in time range)  sodium bicarbonate tablet 650 mg (has no administration in time range)  spironolactone (ALDACTONE) tablet 100 mg (has no administration in time range)  meropenem (MERREM) 1 g in sodium chloride 0.9 % 100 mL IVPB (has no administration in time range)  ondansetron (ZOFRAN) injection 4 mg (4 mg Intravenous Given 06/23/22 1059)  morphine (PF) 4 MG/ML injection 4 mg (4 mg Intravenous Given 06/23/22 1100)  potassium chloride 10 mEq in 100 mL IVPB (0 mEq Intravenous Stopped 06/23/22  1329)  magnesium sulfate IVPB 2 g 50 mL (0 g Intravenous Stopped 06/23/22 1524)  iohexol (OMNIPAQUE) 350 MG/ML injection 75 mL (75 mLs Intravenous Contrast Given 06/23/22 1209)  morphine (PF) 4 MG/ML injection 4 mg (4 mg Intravenous Given 06/23/22 1458)  morphine (PF) 4 MG/ML injection 4 mg (4 mg Intravenous Given 06/23/22 2003)    Mobility walks     Focused Assessments  R Recommendations: See Admitting Provider Note  Report given to:   Additional Notes: a/ox4, ambulatory.

## 2022-06-23 NOTE — ED Provider Notes (Signed)
  Physical Exam  BP 124/76   Pulse (!) 110   Temp 99.1 F (37.3 C)   Resp 19   Ht 5\' 1"  (1.549 m)   Wt 77 kg   SpO2 97%   BMI 32.07 kg/m   Physical Exam Constitutional:      General: He is not in acute distress. Eyes:     General: Scleral icterus present.  Cardiovascular:     Rate and Rhythm: Tachycardia present.  Pulmonary:     Effort: Pulmonary effort is normal.  Abdominal:     General: There is no distension (after paracentesis).     Tenderness: There is generalized abdominal tenderness. There is guarding.  Skin:    General: Skin is warm and dry.     Capillary Refill: Capillary refill takes less than 2 seconds.  Neurological:     General: No focal deficit present.     Mental Status: He is alert.     Procedures  Procedures  ED Course / MDM   Clinical Course as of 06/23/22 2057  Thu Jun 23, 2022  1628 Received sign out pending paracentesis with IR. Presenting with abdominal pain. Has history of SBP. Will re-assess [WS]  1905 Ascitic fluid with 9200 nucleated cells of which 85% are neutrophils. Meets criteria for SBP. Will treat with antibiotics and admit [WS]  1908 Previously grew ESBL E coli. Will give ertapenenem [WS]  1939 Discussed with the hospitalist who will admit patient. [WS]    Clinical Course User Index [WS] Lonell Grandchild, MD   Medical Decision Making Amount and/or Complexity of Data Reviewed Labs: ordered. Radiology: ordered.  Risk Prescription drug management. Decision regarding hospitalization.          Lonell Grandchild, MD 06/23/22 [phone]

## 2022-06-24 DIAGNOSIS — F1010 Alcohol abuse, uncomplicated: Secondary | ICD-10-CM

## 2022-06-24 DIAGNOSIS — K7030 Alcoholic cirrhosis of liver without ascites: Secondary | ICD-10-CM

## 2022-06-24 LAB — CBC WITH DIFFERENTIAL/PLATELET
Abs Immature Granulocytes: 0.1 10*3/uL — ABNORMAL HIGH (ref 0.00–0.07)
Basophils Absolute: 0 10*3/uL (ref 0.0–0.1)
Basophils Relative: 0 %
Eosinophils Absolute: 0.1 10*3/uL (ref 0.0–0.5)
Eosinophils Relative: 1 %
HCT: 28.4 % — ABNORMAL LOW (ref 39.0–52.0)
Hemoglobin: 9.8 g/dL — ABNORMAL LOW (ref 13.0–17.0)
Lymphocytes Relative: 13 %
Lymphs Abs: 0.7 10*3/uL (ref 0.7–4.0)
MCH: 36.6 pg — ABNORMAL HIGH (ref 26.0–34.0)
MCHC: 34.5 g/dL (ref 30.0–36.0)
MCV: 106 fL — ABNORMAL HIGH (ref 80.0–100.0)
Metamyelocytes Relative: 1 %
Monocytes Absolute: 0.1 10*3/uL (ref 0.1–1.0)
Monocytes Relative: 1 %
Neutro Abs: 4.8 10*3/uL (ref 1.7–7.7)
Neutrophils Relative %: 84 %
Platelets: 37 10*3/uL — ABNORMAL LOW (ref 150–400)
RBC: 2.68 MIL/uL — ABNORMAL LOW (ref 4.22–5.81)
RDW: 14.6 % (ref 11.5–15.5)
WBC: 5.7 10*3/uL (ref 4.0–10.5)
nRBC: 0 % (ref 0.0–0.2)
nRBC: 0 /100 WBC

## 2022-06-24 LAB — COMPREHENSIVE METABOLIC PANEL
ALT: 17 U/L (ref 0–44)
AST: 33 U/L (ref 15–41)
Albumin: 2.3 g/dL — ABNORMAL LOW (ref 3.5–5.0)
Alkaline Phosphatase: 70 U/L (ref 38–126)
Anion gap: 6 (ref 5–15)
BUN: 8 mg/dL (ref 6–20)
CO2: 22 mmol/L (ref 22–32)
Calcium: 7.4 mg/dL — ABNORMAL LOW (ref 8.9–10.3)
Chloride: 103 mmol/L (ref 98–111)
Creatinine, Ser: 0.97 mg/dL (ref 0.61–1.24)
GFR, Estimated: >60 mL/min (ref >60)
Glucose, Bld: 115 mg/dL — ABNORMAL HIGH (ref 70–99)
Potassium: 4.2 mmol/L (ref 3.5–5.1)
Sodium: 131 mmol/L — ABNORMAL LOW (ref 135–145)
Total Bilirubin: 5.6 mg/dL — ABNORMAL HIGH (ref 0.3–1.2)
Total Protein: 4.9 g/dL — ABNORMAL LOW (ref 6.5–8.1)

## 2022-06-24 LAB — PATHOLOGIST SMEAR REVIEW

## 2022-06-24 LAB — PHOSPHORUS: Phosphorus: 3.3 mg/dL (ref 2.5–4.6)

## 2022-06-24 LAB — HIV ANTIBODY (ROUTINE TESTING W REFLEX): HIV Screen 4th Generation wRfx: NONREACTIVE

## 2022-06-24 LAB — MAGNESIUM: Magnesium: 1.9 mg/dL (ref 1.7–2.4)

## 2022-06-24 LAB — PROTIME-INR
INR: 2.8 — ABNORMAL HIGH (ref 0.8–1.2)
Prothrombin Time: 29.5 seconds — ABNORMAL HIGH (ref 11.4–15.2)

## 2022-06-24 MED ORDER — MIDODRINE HCL 5 MG PO TABS
5.0000 mg | ORAL_TABLET | Freq: Three times a day (TID) | ORAL | Status: DC
  Administered 2022-06-24: 5 mg via ORAL
  Filled 2022-06-24: qty 1

## 2022-06-24 MED ORDER — ACETAMINOPHEN 325 MG PO TABS
650.0000 mg | ORAL_TABLET | Freq: Four times a day (QID) | ORAL | Status: DC | PRN
  Administered 2022-06-24 – 2022-07-01 (×4): 650 mg via ORAL
  Filled 2022-06-24 (×6): qty 2

## 2022-06-24 MED ORDER — ALBUMIN HUMAN 25 % IV SOLN
25.0000 g | Freq: Four times a day (QID) | INTRAVENOUS | Status: DC
  Administered 2022-06-24 – 2022-06-30 (×24): 25 g via INTRAVENOUS
  Filled 2022-06-24 (×24): qty 100

## 2022-06-24 MED ORDER — ALBUMIN HUMAN 25 % IV SOLN
25.0000 g | Freq: Once | INTRAVENOUS | Status: AC
  Administered 2022-06-24: 25 g via INTRAVENOUS
  Filled 2022-06-24: qty 100

## 2022-06-24 MED ORDER — MELATONIN 5 MG PO TABS
5.0000 mg | ORAL_TABLET | Freq: Every evening | ORAL | Status: DC | PRN
  Administered 2022-06-25 – 2022-07-01 (×2): 5 mg via ORAL
  Filled 2022-06-24 (×2): qty 1

## 2022-06-24 MED ORDER — MIDODRINE HCL 5 MG PO TABS
10.0000 mg | ORAL_TABLET | Freq: Three times a day (TID) | ORAL | Status: DC
  Administered 2022-06-24 – 2022-07-02 (×24): 10 mg via ORAL
  Filled 2022-06-24 (×23): qty 2

## 2022-06-24 NOTE — Progress Notes (Signed)
   06/24/22 1200  Assess: MEWS Score  Temp 98.4 F (36.9 C)  BP (!) 78/51  MAP (mmHg) (!) 60  Pulse Rate 76  ECG Heart Rate 75  Resp 15  Level of Consciousness Alert  SpO2 98 %  O2 Device Room Air  Assess: MEWS Score  MEWS Temp 0  MEWS Systolic 2  MEWS Pulse 0  MEWS RR 0  MEWS LOC 0  MEWS Score 2  MEWS Score Color Yellow  Assess: if the MEWS score is Yellow or Red  Were vital signs taken at a resting state? Yes  Focused Assessment No change from prior assessment  Does the patient meet 2 or more of the SIRS criteria? Yes  Does the patient have a confirmed or suspected source of infection? No  MEWS guidelines implemented  Yes, yellow  Treat  MEWS Interventions Considered administering scheduled or prn medications/treatments as ordered  Take Vital Signs  Increase Vital Sign Frequency  Yellow: Q2hr x1, continue Q4hrs until patient remains green for 12hrs  Escalate  MEWS: Escalate Yellow: Discuss with charge nurse and consider notifying provider and/or RRT  Notify: Charge Nurse/RN  Name of Charge Nurse/RN Notified Desirey  Assess: SIRS CRITERIA  SIRS Temperature  0  SIRS Pulse 0  SIRS Respirations  0  SIRS WBC 0  SIRS Score Sum  0

## 2022-06-24 NOTE — Progress Notes (Signed)
PROGRESS NOTE    Mathew [name]  MWN:[id number] DOB: [birth year] DOA: 06/23/2022 PCP: [name], MD    Chief Complaint  Patient presents with   Abdominal Pain    Brief Narrative:   This is a 42 year old male with past medical history of ESBL bacterial peritonitis, alcoholic liver cirrhosis, pancytopenia, thrombocytopenia, tobacco abuse.  Yesterday he developed mild abdominal pains, these became progressively worse and was severe today.  He was unable to go to work and came to the ER.  He endorses being weak, nauseous and vomiting today.  He also admits endorses being lightheaded and dizzy since yesterday.  He denies fever, chills, or hematemesis.  He states he seldom drinks.  This weekend he had some 12 ounces of beer.   Patient recently admitted 5/20 to 06/06/2022.  He was diagnosed with ESBL UTI.  At the time of discharge he was directed to take Cipro 25 mg p.o. 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  On reviewing patient's medication, it does not appear patient has been taking antibiotics.   In the ER ultrasound-guided paracentesis done.  Fluid was cloudy.  Total nucleated cell count 9, 210,  neutrophils 25.  Alcohol level 102 Tmax 100.1, HR 126 currently improved down to 110.  BP stable, RR 24 at presentation, currently 17.  Cultures collected.  WBC is 2.1, hemoglobin 10.5, platelets 35.  INR 2.8.  Patient given meropenem.   CT abdomen pelvis showed Again changes of chronic liver disease with nodular fatty liver with significant ascites. Patent portal vein. No enhancing liver lesion.   There is multifocal areas of wall thickening along the bowel which is nondilated. However the wall thickening along the colon appears out of proportion and increased from previous. Underlying colitis is possible.   Patient Spanish-speaking, translation line used.  Assessment & Plan:   Principal Problem:   SBP (spontaneous bacterial peritonitis) (HCC) s/t Decompensated  Hepatic Cirrhosis Active Problems:   Hypomagnesemia   Alcoholic cirrhosis of liver with ascites (HCC)   Hypokalemia   Alcohol use disorder   Macrocytic anemia   Pancytopenia (HCC)   Edema due to hypoalbuminemia   Tobacco abuse   Cirrhosis of liver (HCC)   Thrombocytopenia (HCC)   Decompensated hepatic cirrhosis (HCC)  SBP (spontaneous bacterial peritonitis) (HCC) -Presents presents with abdominal pain, recent ESBL E. coli bacterial peritonitis, so for now we will continue with IV meropenem -Large-volume paracentesis on admission 6/13 with workup significant for SBP with 9210 nucleated cells . -Long cultures . -Give IV albumin and started on midodrine given large volume (6.3 L  -He is supposed to be on Cipro p.o. prophylaxis at home   Decompensated alcoholic liver cirrhosis  Hyperammonemia Coagulopathy Thrombocytopenia DM -He is still actively drinking, prognosis is extremely poor  -Continue with propranolol, lactulose, Lasix and Aldactone   Alcohol use disorder -Alcohol level 102 -CIWA initiated -Continue thiamine, folate, MVI     Hypokalemia/hypocalcemia -Repleting    Hypomagnesemia -Repleting    Pancytopenia (HCC)/ Thrombocytopenia (HCC) -Secondary to liver cirrhosis -Patient denies bleeding in stool, emesis or gums -Transfuse platelets if less than 20 or if patient bleeding. 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 Matthias Hughs  High risk for poor prognosis--MELD score portends 30 to 40% 3-month mortality -Maintained  on lifelong ciprofloxacin, Lasix Aldactone 50: 20 ratio as well as bicarb -[facility] to follow patient in the outpatient as he is DNR     DVT prophylaxis: SCD  Code  Status: DNR Family Communication: None at bedside Disposition:   Status is: Inpatient    Consultants:  None   Subjective:  He reports poor appetite, nausea, abdominal pain  Objective: Vitals:   06/24/22 0405 06/24/22 0548 06/24/22 0820 06/24/22 0931  BP: 106/67 (!) 96/57 (!) 102/58   Pulse: 94 92 85 85  Resp: 18 17 14 12   Temp: (!) 100.9 F (38.3 C) (!) 100.4 F (38 C) 99 F (37.2 C)   TempSrc: Oral  Oral   SpO2: 95% 92% 93% 93%  Weight:      Height:        Intake/Output Summary (Last 24 hours) at 06/24/2022 1138 Last data filed at 06/24/2022 0820 Gross per 24 hour  Intake 920 ml  Output 300 ml  Net 620 ml   Filed Weights   06/23/22 1043  Weight: 77 kg    Examination:  Awake Alert, Oriented X 3, tremulous frail, ill-appearing, jaundiced Symmetrical Chest wall movement, Good air movement bilaterally, CTAB RRR,No Gallops,Rubs or new Murmurs, No Parasternal Heave +ve B.Sounds, ascites present, tender to palpation No Cyanosis, Clubbing or edema, No new Rash or bruise       Data Reviewed: I have personally reviewed following labs and imaging studies  CBC: Recent Labs  Lab 06/23/22 1053 06/23/22 1152 06/24/22 0335  WBC 2.1*  --  5.7  NEUTROABS 1.0*  --  4.8  HGB 10.5* 9.9* 9.8*  HCT 31.8* 29.0* 28.4*  MCV 107.8*  --  106.0*  PLT 35*  --  37*    Basic Metabolic Panel: Recent Labs  Lab 06/23/22 1053 06/23/22 1152 06/23/22 2252 06/24/22 0335  NA 137 140  --  131*  K 3.3* 3.4*  --  4.2  CL 108  --   --  103  CO2 15*  --   --  22  GLUCOSE 105*  --   --  115*  BUN <5*  --   --  8  CREATININE 0.67  --   --  0.97  CALCIUM 7.7*  --   --  7.4*  MG 1.6*  --   --  1.9  PHOS  --   --  3.5 3.3    GFR: Estimated Creatinine Clearance: 85.5 mL/min (by C-G formula based on SCr of 0.97 mg/dL).  Liver Function Tests: Recent Labs  Lab 06/23/22 1053 06/24/22 0335  AST 43* 33  ALT 18 17  ALKPHOS 92 70  BILITOT 3.8* 5.6*  PROT 5.4* 4.9*  ALBUMIN  2.6* 2.3*    CBG: No results for input(s): "GLUCAP" in the last 168 hours.   Recent Results (from the past 240 hour(s))  Body fluid culture w Gram Stain     Status: None (Preliminary result)   Collection Time: 06/23/22  4:50 PM   Specimen: Abdomen; Peritoneal Fluid  Result Value Ref Range Status   Specimen Description ABDOMEN PERITONEAL  Final   Special Requests NONE  Final   Gram Stain   Final    RARE WBC PRESENT,BOTH PMN AND MONONUCLEAR NO ORGANISMS SEEN Performed at [facility], [address] [place], [place] [address]    Culture PENDING  Incomplete   Report Status PENDING  Incomplete         Radiology Studies: IR Paracentesis  Result Date: 06/23/2022 INDICATION: Patient with history of ETOH cirrhosis, recurrent ascites, recurrent SBP. Patient currently in the ED with abdominal pain and concern for SBP. Request for diagnostic and therapeutic paracentesis. EXAM: ULTRASOUND GUIDED DIAGNOSTIC AND THERAPEUTIC PARACENTESIS MEDICATIONS: 6 mL 1% lidocaine COMPLICATIONS: None immediate. PROCEDURE: Informed written consent was obtained from the patient after a discussion of the risks, benefits and alternatives to treatment. A timeout was performed prior to the initiation of the procedure. Initial ultrasound scanning demonstrates a large amount of ascites within the left lower abdominal quadrant. The left lower abdomen was prepped and draped in the usual sterile fashion. 1% lidocaine was used for local anesthesia. Following this, a 19 gauge, 7-cm, Yueh catheter was introduced. An ultrasound image was saved for documentation purposes. The paracentesis was performed. The catheter was removed and a dressing was applied. The patient tolerated the procedure well without immediate post procedural complication. FINDINGS: A total of approximately 6.3 L of hazy, dark yellow fluid was removed. IMPRESSION: Successful ultrasound-guided paracentesis yielding 6.3 liters of peritoneal fluid.  Performed by Lynnette Caffey, PA-C Electronically Signed   By: Richarda Overlie M.D.   On: 06/23/2022 18:33   CT ABDOMEN PELVIS W CONTRAST  Result Date: 06/23/2022 CLINICAL DATA:  Abdominal pain with nausea vomiting. History of liver failure. EXAM: CT ABDOMEN AND PELVIS WITH CONTRAST TECHNIQUE: Multidetector CT imaging of the abdomen and pelvis was performed using the standard protocol following bolus administration of intravenous contrast. RADIATION DOSE REDUCTION: This exam was performed according to the departmental dose-optimization program which includes automated exposure control, adjustment of the mA and/or kV according to patient size and/or use of iterative reconstruction technique. CONTRAST:  75mL OMNIPAQUE IOHEXOL 350 MG/ML SOLN COMPARISON:  Ultrasound 06/02/2022.  CT 10/29/2018. FINDINGS: Lower chest: Small left and tiny right pleural effusion with adjacent bandlike opacities. Atelectasis is favored. Breathing motion at the lung bases. Gynecomastia. Trace pericardial Hepatobiliary: Small fatty liver with nodular contour consistent with the history of chronic liver disease. No obvious enhancing liver lesion gallbladder is mildly distended with dependent stones. Patent portal vein. Diameter of the main portal vein at the liver hilum measuring 17 mm. Pancreas: Unremarkable. No pancreatic ductal dilatation or surrounding inflammatory changes. Spleen: Normal in size without focal abnormality. Adrenals/Urinary Tract: Adrenal glands are unremarkable. Kidneys are normal, without renal calculi, focal lesion, or hydronephrosis. Bladder is underdistended but has diffuse wall thickening. Stomach/Bowel: No oral contrast. The large bowel is of normal course and caliber with diffuse colonic wall thickening. The wall thickening is more than expected for some please hepatic biliary ascites and is increased from previous. Please correlate for an underlying colitis. The stomach and small bowel are nondilated. These loops  also have some wall thickening but is more mild. Normal appendix. Vascular/Lymphatic: Aortic atherosclerosis. No enlarged abdominal or pelvic lymph nodes. Reproductive: Prostate is unremarkable. Presumed left testicle in the inguinal canal. Other: Amount of ascites. No free air. Ascites is extending to the small umbilical hernia. Musculoskeletal: Mild degenerative changes. IMPRESSION: Again changes of chronic liver disease with nodular fatty liver with significant ascites. Patent portal vein. No enhancing liver lesion. There is multifocal areas of wall thickening along the bowel which is nondilated. However the wall thickening along the colon appears out of proportion and increased from previous. Underlying colitis is possible. Gallstones. Small pleural effusions, left-greater-than-right. Electronically Signed   By: Karen Kays M.D.   On: 06/23/2022 13:03        Scheduled Meds:  ferrous sulfate  325 mg Oral  Q breakfast   folic acid  1 mg Oral Daily   furosemide  20 mg Oral Daily   lactulose  30 g Oral Q6H   midodrine  5 mg Oral TID WC   multivitamin with minerals  1 tablet Oral Daily   pantoprazole  40 mg Oral Q0600   propranolol  10 mg Oral BID   sodium bicarbonate  650 mg Oral BID   spironolactone  100 mg Oral BID   thiamine  100 mg Oral Daily   Or   thiamine  100 mg Intravenous Daily   Continuous Infusions:  meropenem (MERREM) IV 1 g (06/24/22 0541)     LOS: 1 day        Huey Bienenstock, MD [facility]   To contact the attending provider between 7A-7P or the covering provider during after hours 7P-7A, please log into the web site [email or web address] and access using universal [place] password for that web site. If you do not have the password, please call the hospital operator.  06/24/2022, 11:38 AM

## 2022-06-24 NOTE — TOC CM/SW Note (Signed)
Transition of Care [facility]) - Inpatient Brief Assessment   Patient Details  Name: Mathew [name] MRN: [id number] Date of Birth: 1980-03-28  Transition of Care [facility]) CM/SW Contact:    Mearl Latin, LCSW Phone Number: 06/24/2022, 3:05 PM   Clinical Narrative: Patient with recent admission for similar medical presentation. Patient has substance use resources but CSW placed on AVS again in Spanish as well. Please consult TOC if medication assistance is needed at discharge as patient is uninsured.    Transition of Care Asessment: Insurance and Status: Selfpay Patient has primary care physician: Yes (Patient is active with [facility]) Home environment has been reviewed: From home w/family Prior level of function:: Independent at home Prior/Current Home Services: No current home services Social Determinants of Health Reivew: SDOH reviewed no interventions necessary Readmission risk has been reviewed: Yes Transition of care needs: transition of care needs identified, TOC will continue to follow

## 2022-06-24 NOTE — Discharge Instructions (Signed)
[facility] Espaol: Cuando nos llames al [phone], la esperanza est en juego. Esta iniciativa est en asociacin con las siete LME/MCO del estado y [facility] en [place]. [facility] es confidencial y est disponible las 24 horas del da, los 7 [address]  [address] de la [place]. Neomia Dear persona siempre responder, cuando necesites ayuda.  En tiempos de crisis: Educational psychologist, inc[employer]  Mobile Crisis Management proporciona una respuesta inmediata a las crisis, 24/7.  Llame al [phone]  [facility] para el Centro de Teacher, adult education a Servicios MH/DD/SA est disponible las 24 horas del da, los 7 das de la [place]. Los especialistas en Servicio al Cliente lo ayudarn a Clinical research associate un proveedor de crisis que coincida bien con sus necesidades. Su nmero local es: [phone]  centro de Salud Conductual del [place]/Cuidado Urgente de Salud Conductual (BHUC) PIO, asesoramiento individual, gestin de medicamentos [address] [address], [place], [place] ([place]) [phone] Llame para las horas de admisin; [place] y sin seguro  Proveedores Ambulatorios:  Servicios de Alcohol y [place] (ADS) Asesoramiento grupal e individual. [address]  [address], [place], [place] [phone] [place]: [phone] [place]: [phone] Medicaid y no asegurados.   [facility] Ofrece grupos de PIO varias veces por semana. Direccin: [address] Sherian Maroon [place], [place] [address] [phone] Toma Medicaid y otros seguros.   [facility] Ambulatorio  Programa Ambulatorio Intensivo de Dependencia [place] (IOP) [address] [phone], [place], [place] [phone] Jeanie Sewer comercial y [place].   [place]  Programa de Hospitalizacin Parcial y [place]  Direccin: [address]  [address], [place], [place] [phone] Seguro privado, [place] solo para hospitalizacin parcial  ACDM Evaluacin y Asesoramiento de [place], [employer]. [address],  [address], [place], [place] [address] [phone] [place]. Opciones a corto y Air cabin crew.Centro de Salud Conductual del [place]/Cuidado Urgente de Salud Conductual (BHUC) PIO, asesoramiento individual, gestin de medicamentos [address] [address], [place], [place] [phone] Medicaid y Sin Seguro  Recursos Conductuales de la [place]ada [address]  [address], [place], [place] [phone] Seguro Privado y Self Pay   [facility] Ambulatorio [address] [place]  [place], [place] [address] [phone] [place], [place] y Self Pay   Encrucijada: Kerry [address]  [address]  [address], [place], [place] Servicios de [place] de [place]  [address]  [address], [place], [place] [phone]  Melany Guernsey  [address] [place], [place] [address] [phone]    Programas Dionicio [place] Residencial:  [facility] ([facility] [address] [phone] [place], [place] [phone] o [phone] Desintoxicacin y Rehabilitacin Residencial [address] (Medicare, [place], seguro privado y [place]). Sin metadona. Llame para pre-pantalla.   RTS Arlyss Repress de [place] Residencial) [address]  [address], [place], [place] [phone] Detox (autopago y disponibilidad limitada de [place]) Rehab Only Male (Medicare, [place] y Self Pay) - Sin metadona.  [facility] de [facility] [address] [address], [place], [place] [phone] o [phone] [facility] de la [place]: [phone] FAX: [phone] Programa residencial para mujeres de 21 aos o ms por hasta un ao a travs de un modelo cristiano de recuperacin de 12 pasos. Autopago.    Cam[place] de la [place] [address] [address], [place], [place] Telfono: [phone] Debe ser desintoxicado 72 horas antes de la admisin; [place] de [address]  [place]opago.  Centro de [employer]  [address] [place] [phone] Seguro comercial, Medicare, Medicaid (no [place] directo). Ofrecen asistencia con el transporte.   Misin de [employer] Direccin: [address] [place],  [address], [place], [place] [phone] Programa Basado en Cristianos. Solo hombres. Sin seguro  Programa residencial [facility] [address] de [place] [address], [place], [place] Mujeres: [phone] Mens: [phone] No Medicaid.   Centros de Adiccin de [place] [employer] [place] (principalmente [place]) dispuestas a ayudar con el transporte.  [phone] Milus Glazier de seguros comerciales.Instalacin de [place] Residencial [facility]  [address]  [place], [place] [address] [phone] Solo tratamiento, debe hacer una cita de evaluacin y debe estar sobrio para la cita de evaluacin. Autopago, Medicare A y B, Medicaid del [address] de [place], debe ser residente del [place]. Sin metadona.   TROS[facility]  [address] [address], [place], [place] [phone] Sin cargos legales pendientes, Programa de [place] a Air cabin crew. Sin metadona. Convocatoria de evaluacin.  Centro de recuperacin de [place]  [address] [address], [place], [place] [phone] o [phone] Solo Seguro Comercial  Centros de [place] de Ambrosia Local - [phone] Seguro [place] (sin [place]). Hombres/Mujeres, llamar para hacer referencias, mltiples instalaciones   [facility] Direccin: [address]  [address], [place], [place]  [phone] Solo para Hombres [place] por adelantado [facility] Programa para mujeres: [facility] [address] [place], [place] [address] [phone]  [facility], sin [facility] [address] [address], [place], [place] [phone] (f)  Campus de [place] [address] [place] [address], [place], [place] [phone]  (f)  Programa de Mare [facility] [phone] [place], [place] Para las [place], [place] a 8 residentes para una vida sobria. No Medicaid.

## 2022-06-24 NOTE — Progress Notes (Signed)
Dr. Margo Aye was made aware that pt has a temp of 100.71F, BP=96/557. Pt is A&OX4 and voided urine @ 0400 this morning. Tylenol 650mg  po was ordered and given.

## 2022-06-25 DIAGNOSIS — K7031 Alcoholic cirrhosis of liver with ascites: Secondary | ICD-10-CM

## 2022-06-25 LAB — COMPREHENSIVE METABOLIC PANEL
ALT: 11 U/L (ref 0–44)
AST: 22 U/L (ref 15–41)
Albumin: 2.5 g/dL — ABNORMAL LOW (ref 3.5–5.0)
Alkaline Phosphatase: 47 U/L (ref 38–126)
Anion gap: 7 (ref 5–15)
BUN: 20 mg/dL (ref 6–20)
CO2: 20 mmol/L — ABNORMAL LOW (ref 22–32)
Calcium: 7.4 mg/dL — ABNORMAL LOW (ref 8.9–10.3)
Chloride: 103 mmol/L (ref 98–111)
Creatinine, Ser: 1.5 mg/dL — ABNORMAL HIGH (ref 0.61–1.24)
GFR, Estimated: 59 mL/min — ABNORMAL LOW (ref >60)
Glucose, Bld: 98 mg/dL (ref 70–99)
Potassium: 3.1 mmol/L — ABNORMAL LOW (ref 3.5–5.1)
Sodium: 130 mmol/L — ABNORMAL LOW (ref 135–145)
Total Bilirubin: 5.1 mg/dL — ABNORMAL HIGH (ref 0.3–1.2)
Total Protein: 4.6 g/dL — ABNORMAL LOW (ref 6.5–8.1)

## 2022-06-25 LAB — CBC
HCT: 23.2 % — ABNORMAL LOW (ref 39.0–52.0)
Hemoglobin: 8 g/dL — ABNORMAL LOW (ref 13.0–17.0)
MCH: 35.4 pg — ABNORMAL HIGH (ref 26.0–34.0)
MCHC: 34.5 g/dL (ref 30.0–36.0)
MCV: 102.7 fL — ABNORMAL HIGH (ref 80.0–100.0)
Platelets: 28 10*3/uL — CL (ref 150–400)
RBC: 2.26 MIL/uL — ABNORMAL LOW (ref 4.22–5.81)
RDW: 14.6 % (ref 11.5–15.5)
WBC: 5.4 10*3/uL (ref 4.0–10.5)
nRBC: 0 % (ref 0.0–0.2)

## 2022-06-25 LAB — BODY FLUID CULTURE W GRAM STAIN: Culture: NO GROWTH

## 2022-06-25 MED ORDER — POTASSIUM CHLORIDE CRYS ER 20 MEQ PO TBCR
40.0000 meq | EXTENDED_RELEASE_TABLET | Freq: Four times a day (QID) | ORAL | Status: AC
  Administered 2022-06-25 (×2): 40 meq via ORAL
  Filled 2022-06-25 (×2): qty 2

## 2022-06-25 NOTE — Progress Notes (Signed)
PROGRESS NOTE    Mathew [name]  ZOX:[id number] DOB: 1980/09/01 DOA: 06/23/2022 PCP: [name], MD    Chief Complaint  Patient presents with   Abdominal Pain    Brief Narrative:   This is a 42 year old male with past medical history of ESBL bacterial peritonitis, alcoholic liver cirrhosis, pancytopenia, thrombocytopenia, tobacco abuse.  Yesterday he developed mild abdominal pains, these became progressively worse and was severe today.  He was unable to go to work and came to the ER.  He endorses being weak, nauseous and vomiting today.  He also admits endorses being lightheaded and dizzy since yesterday.  He denies fever, chills, or hematemesis.  He states he seldom drinks.  This weekend he had some 12 ounces of beer.   Patient recently admitted 5/20 to 06/06/2022.  He was diagnosed with ESBL UTI.  At the time of discharge he was directed to take Cipro 25 mg p.o. 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  On reviewing patient's medication, it does not appear patient has been taking antibiotics.   In the ER ultrasound-guided paracentesis done.  Fluid was cloudy.  Total nucleated cell count 9, 210,  neutrophils 25.  Alcohol level 102 Tmax 100.1, HR 126 currently improved down to 110.  BP stable, RR 24 at presentation, currently 17.  Cultures collected.  WBC is 2.1, hemoglobin 10.5, platelets 35.  INR 2.8.  Patient given meropenem.   CT abdomen pelvis showed Again changes of chronic liver disease with nodular fatty liver with significant ascites. Patent portal vein. No enhancing liver lesion.   There is multifocal areas of wall thickening along the bowel which is nondilated. However the wall thickening along the colon appears out of proportion and increased from previous. Underlying colitis is possible.   Patient Spanish-speaking, translation line used.  Assessment & Plan:   Principal Problem:   SBP (spontaneous bacterial peritonitis) (HCC) s/t Decompensated  Hepatic Cirrhosis Active Problems:   Hypomagnesemia   Alcoholic cirrhosis of liver with ascites (HCC)   Hypokalemia   Alcohol use disorder   Macrocytic anemia   Pancytopenia (HCC)   Edema due to hypoalbuminemia   Tobacco abuse   Cirrhosis of liver (HCC)   Thrombocytopenia (HCC)   Decompensated hepatic cirrhosis (HCC)  SBP (spontaneous bacterial peritonitis) (HCC) -Presents presents with abdominal pain, recent ESBL E. coli bacterial peritonitis, so for now we will continue with IV meropenem -Large-volume paracentesis on admission 6/13 with workup significant for SBP with 9210 nucleated cells . -4 cultures with no growth to date -Trend is up to 1.5 today, continue with midodrine, scheduled albumin, I will discontinue his Aldactone and Lasix for now -He is supposed to be on Cipro p.o. prophylaxis at home   AKI-creatinine up to 1.5 today, concern for HUS in the setting of recent large-volume paracentesis, continue with midodrine and albumin.  Decompensated alcoholic liver cirrhosis  Hyperammonemia Coagulopathy Thrombocytopenia DM -He is still actively drinking, prognosis is extremely poor  -Continue with propranolol, lactulose -Please see above discussion regarding holding Aldactone and Lasix.   Alcohol use disorder -Alcohol level 102 -CIWA initiated -Continue thiamine, folate, MVI     Hypokalemia/hypocalcemia -Repleting    Hypomagnesemia -Repleting    Pancytopenia (HCC)/ Thrombocytopenia (HCC) -Secondary to liver cirrhosis -Patient denies bleeding in stool, emesis or gums -Transfuse platelets if less than 20 or if patient bleeding. 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 Matthias Hughs  High risk for poor prognosis--MELD  score portends 30 to 40% 71-month mortality -Maintained on lifelong ciprofloxacin, Lasix Aldactone 50: 20 ratio as well as bicarb -Palliative care/[facility] to follow patient in the outpatient as he is DNR     DVT prophylaxis: SCD  Code Status: DNR Family Communication: Discussed with his aunt at bedside with his permission, Roberto Scales left her phone #[phone], via tele interpreter Disposition:   Status is: Inpatient    Consultants:  None   Subjective:  Still reports abdominal pain, nausea and poor appetite  Objective: Vitals:   06/25/22 0400 06/25/22 0800 06/25/22 1000 06/25/22 1146  BP: (!) 96/52 (!) 99/49 (!) 99/50 (!) 103/55  Pulse: 79 82 77 78  Resp: 18 14  20   Temp:  99.8 F (37.7 C)  99.7 F (37.6 C)  TempSrc:  Oral  Oral  SpO2: 95% 98%  96%  Weight:      Height:        Intake/Output Summary (Last 24 hours) at 06/25/2022 1255 Last data filed at 06/25/2022 0800 Gross per 24 hour  Intake 960 ml  Output --  Net 960 ml   Filed Weights   06/23/22 1043  Weight: 77 kg    Examination:  Awake Alert, Oriented X 3, frail, ill-appearing, jaundiced Symmetrical Chest wall movement, Good air movement bilaterally, CTAB RRR,No Gallops,Rubs or new Murmurs, No Parasternal Heave +ve B.Sounds, tenderness to palpation No Cyanosis, Clubbing or edema, No new Rash or bruise       Data Reviewed: I have personally reviewed following labs and imaging studies  CBC: Recent Labs  Lab 06/23/22 1053 06/23/22 1152 06/24/22 0335 06/25/22 0250  WBC 2.1*  --  5.7 5.4  NEUTROABS 1.0*  --  4.8  --   HGB 10.5* 9.9* 9.8* 8.0*  HCT 31.8* 29.0* 28.4* 23.2*  MCV 107.8*  --  106.0* 102.7*  PLT 35*  --  37* 28*    Basic Metabolic Panel: Recent Labs  Lab 06/23/22 1053 06/23/22 1152 06/23/22 2252 06/24/22 0335 06/25/22 0250  NA 137 140  --  131* 130*  K 3.3* 3.4*  --  4.2 3.1*  CL 108  --   --  103 103  CO2 15*  --   --  22 20*  GLUCOSE 105*  --   --  115*  98  BUN <5*  --   --  8 20  CREATININE 0.67  --   --  0.97 1.50*  CALCIUM 7.7*  --   --  7.4* 7.4*  MG 1.6*  --   --  1.9  --   PHOS  --   --  3.5 3.3  --     GFR: Estimated Creatinine Clearance: 55.3 mL/min (A) (by C-G formula based on SCr of 1.5 mg/dL (H)).  Liver Function Tests: Recent Labs  Lab 06/23/22 1053 06/24/22 0335 06/25/22 0250  AST 43* 33 22  ALT 18 17 11   ALKPHOS 92 70 47  BILITOT 3.8* 5.6* 5.1*  PROT 5.4* 4.9* 4.6*  ALBUMIN 2.6* 2.3* 2.5*    CBG: No results for input(s): "GLUCAP" in the last 168 hours.   Recent Results (from the past 240 hour(s))  Body fluid culture w Gram Stain     Status: None (Preliminary result)   Collection Time: 06/23/22  4:50 PM   Specimen: Abdomen; Peritoneal Fluid  Result  Value Ref Range Status   Specimen Description ABDOMEN PERITONEAL  Final   Special Requests NONE  Final   Gram Stain   Final    RARE WBC PRESENT,BOTH PMN AND MONONUCLEAR NO ORGANISMS SEEN    Culture   Final    NO GROWTH 2 DAYS Performed at [facility], [address] [place], [place] [address]    Report Status PENDING  Incomplete         Radiology Studies: IR Paracentesis  Result Date: 06/23/2022 INDICATION: Patient with history of ETOH cirrhosis, recurrent ascites, recurrent SBP. Patient currently in the ED with abdominal pain and concern for SBP. Request for diagnostic and therapeutic paracentesis. EXAM: ULTRASOUND GUIDED DIAGNOSTIC AND THERAPEUTIC PARACENTESIS MEDICATIONS: 6 mL 1% lidocaine COMPLICATIONS: None immediate. PROCEDURE: Informed written consent was obtained from the patient after a discussion of the risks, benefits and alternatives to treatment. A timeout was performed prior to the initiation of the procedure. Initial ultrasound scanning demonstrates a large amount of ascites within the left lower abdominal quadrant. The left lower abdomen was prepped and draped in the usual sterile fashion. 1% lidocaine was used for local  anesthesia. Following this, a 19 gauge, 7-cm, Yueh catheter was introduced. An ultrasound image was saved for documentation purposes. The paracentesis was performed. The catheter was removed and a dressing was applied. The patient tolerated the procedure well without immediate post procedural complication. FINDINGS: A total of approximately 6.3 L of hazy, dark yellow fluid was removed. IMPRESSION: Successful ultrasound-guided paracentesis yielding 6.3 liters of peritoneal fluid. Performed by Lynnette Caffey, PA-C Electronically Signed   By: Richarda Overlie M.D.   On: 06/23/2022 18:33        Scheduled Meds:  ferrous sulfate  325 mg Oral Q breakfast   folic acid  1 mg Oral Daily   midodrine  10 mg Oral TID WC   multivitamin with minerals  1 tablet Oral Daily   pantoprazole  40 mg Oral Q0600   potassium chloride  40 mEq Oral Q6H   propranolol  10 mg Oral BID   sodium bicarbonate  650 mg Oral BID   thiamine  100 mg Oral Daily   Or   thiamine  100 mg Intravenous Daily   Continuous Infusions:  albumin human 25 g (06/25/22 0814)   meropenem (MERREM) IV 1 g (06/25/22 0550)     LOS: 2 days        Huey Bienenstock, MD [facility]   To contact the attending provider between 7A-7P or the covering provider during after hours 7P-7A, please log into the web site [email or web address] and access using universal [place] password for that web site. If you do not have the password, please call the hospital operator.  06/25/2022, 12:55 PM

## 2022-06-25 NOTE — Plan of Care (Signed)
  Problem: Education: Goal: Knowledge of General Education information will improve Description Including pain rating scale, medication(s)/side effects and non-pharmacologic comfort measures Outcome: Progressing   Problem: Health Behavior/Discharge Planning: Goal: Ability to manage health-related needs will improve Outcome: Progressing   

## 2022-06-26 LAB — COMPREHENSIVE METABOLIC PANEL
ALT: 11 U/L (ref 0–44)
AST: 20 U/L (ref 15–41)
Albumin: 3.1 g/dL — ABNORMAL LOW (ref 3.5–5.0)
Alkaline Phosphatase: 44 U/L (ref 38–126)
Anion gap: 7 (ref 5–15)
BUN: 26 mg/dL — ABNORMAL HIGH (ref 6–20)
CO2: 18 mmol/L — ABNORMAL LOW (ref 22–32)
Calcium: 7.3 mg/dL — ABNORMAL LOW (ref 8.9–10.3)
Chloride: 102 mmol/L (ref 98–111)
Creatinine, Ser: 1.19 mg/dL (ref 0.61–1.24)
GFR, Estimated: >60 mL/min (ref >60)
Glucose, Bld: 85 mg/dL (ref 70–99)
Potassium: 3.1 mmol/L — ABNORMAL LOW (ref 3.5–5.1)
Sodium: 127 mmol/L — ABNORMAL LOW (ref 135–145)
Total Bilirubin: 5.1 mg/dL — ABNORMAL HIGH (ref 0.3–1.2)
Total Protein: 4.7 g/dL — ABNORMAL LOW (ref 6.5–8.1)

## 2022-06-26 LAB — TYPE AND SCREEN
ABO/RH(D): O POS
Antibody Screen: NEGATIVE
Unit division: 0

## 2022-06-26 LAB — CBC
HCT: 21 % — ABNORMAL LOW (ref 39.0–52.0)
Hemoglobin: 7.4 g/dL — ABNORMAL LOW (ref 13.0–17.0)
MCH: 35.7 pg — ABNORMAL HIGH (ref 26.0–34.0)
MCHC: 35.2 g/dL (ref 30.0–36.0)
MCV: 101.4 fL — ABNORMAL HIGH (ref 80.0–100.0)
Platelets: 25 10*3/uL — CL (ref 150–400)
RBC: 2.07 MIL/uL — ABNORMAL LOW (ref 4.22–5.81)
RDW: 14.2 % (ref 11.5–15.5)
WBC: 3.1 10*3/uL — ABNORMAL LOW (ref 4.0–10.5)
nRBC: 0 % (ref 0.0–0.2)

## 2022-06-26 LAB — PREPARE RBC (CROSSMATCH)

## 2022-06-26 MED ORDER — ACETAMINOPHEN 500 MG PO TABS
500.0000 mg | ORAL_TABLET | Freq: Once | ORAL | Status: AC
  Administered 2022-06-26: 500 mg via ORAL
  Filled 2022-06-26: qty 1

## 2022-06-26 MED ORDER — SODIUM CHLORIDE 0.9% IV SOLUTION: Freq: Once | INTRAVENOUS | Status: AC

## 2022-06-26 NOTE — Evaluation (Signed)
Physical Therapy Evaluation Patient Details Name: Mathew [name] MRN: [id number] DOB: Mar 10, 1980 Today's Date: 06/26/2022  History of Present Illness  42 y.o. male presents to [facility] hospital on 06/23/2022 with severe abdominal pain, weakness and emesis. Pt admitted for management of spontaneous bacterial peritonitis. PMH: alcohol cirrhosis with recurrent ascites, ETOH abuse, seizure.  Clinical Impression  Pt presents to PT at or near his baseline. Pt is mobilizing independently and reports significant improvement in abdominal pain. Pt demonstrates no further PT needs at this time. Acute PT signing off.       Recommendations for follow up therapy are one component of a multi-disciplinary discharge planning process, led by the attending physician.  Recommendations may be updated based on patient status, additional functional criteria and insurance authorization.  Follow Up Recommendations       Assistance Recommended at Discharge PRN  Patient can return home with the following  Assist for transportation    Equipment Recommendations None recommended by PT  Recommendations for Other Services       Functional Status Assessment Patient has not had a recent decline in their functional status     Precautions / Restrictions Precautions Precautions: None Restrictions Weight Bearing Restrictions: No      Mobility  Bed Mobility Overal bed mobility: Independent                  Transfers Overall transfer level: Independent Equipment used: None                    Ambulation/Gait Ambulation/Gait assistance: Independent Gait Distance (Feet): 300 Feet Assistive device: None Gait Pattern/deviations: WFL(Within Functional Limits) Gait velocity: functional Gait velocity interpretation: >2.62 ft/sec, indicative of community ambulatory   General Gait Details: steady step-through gait  Stairs Stairs:  (pt declines the need for stair training)           Wheelchair Mobility    Modified Rankin (Stroke Patients Only)       Balance Overall balance assessment: Independent                                           Pertinent Vitals/Pain Pain Assessment Pain Assessment: No/denies pain    Home Living Family/patient expects to be discharged to:: Private residence Living Arrangements: Non-relatives/Friends Available Help at Discharge: Friend(s) Type of Home: House Home Access: Stairs to enter Entrance Stairs-Rails: Doctor, general practice of Steps: 2   Home Layout: One level Home Equipment: None      Prior Function Prior Level of Function : Independent/Modified Independent;Driving;Working/employed             Mobility Comments: works in Automotive engineer        Extremity/Trunk Assessment   Upper Extremity Assessment Upper Extremity Assessment: Overall WFL for tasks assessed    Lower Extremity Assessment Lower Extremity Assessment: Overall WFL for tasks assessed    Cervical / Trunk Assessment Cervical / Trunk Assessment: Normal  Communication   Communication: Prefers language other than English;Interpreter utilized  Cognition Arousal/Alertness: Awake/alert Behavior During Therapy: WFL for tasks assessed/performed Overall Cognitive Status: Within Functional Limits for tasks assessed  General Comments General comments (skin integrity, edema, etc.): VSS on RA    Exercises     Assessment/Plan    PT Assessment Patient does not need any further PT services  PT Problem List         PT Treatment Interventions      PT Goals (Current goals can be found in the Care Plan section)       Frequency       Co-evaluation               AM-PAC PT "6 Clicks" Mobility  Outcome Measure Help needed turning from your back to your side while in a flat bed without using bedrails?: None Help needed  moving from lying on your back to sitting on the side of a flat bed without using bedrails?: None Help needed moving to and from a bed to a chair (including a wheelchair)?: None Help needed standing up from a chair using your arms (e.g., wheelchair or bedside chair)?: None Help needed to walk in hospital room?: None Help needed climbing 3-5 steps with a railing? : None 6 Click Score: 24    End of Session   Activity Tolerance: Patient tolerated treatment well Patient left: in bed;with call bell/phone within reach Nurse Communication: Mobility status PT Visit Diagnosis: Other abnormalities of gait and mobility (R26.89)    Time: 1050-1105 PT Time Calculation (min) (ACUTE ONLY): 15 min   Charges:   PT Evaluation $PT Eval Low Complexity: 1 Low          Mathew [name], PT, DPT Acute Rehabilitation Office [phone]   Mathew [name] 06/26/2022, 11:15 AM

## 2022-06-26 NOTE — Progress Notes (Signed)
PROGRESS NOTE    Mathew [name]  ZOX:[id number] DOB: [birth year] DOA: 06/23/2022 PCP: [name], MD    Chief Complaint  Patient presents with   Abdominal Pain    Brief Narrative:   This is a 42 year old male with past medical history of ESBL bacterial peritonitis, alcoholic liver cirrhosis, pancytopenia, thrombocytopenia, tobacco abuse.  Yesterday he developed mild abdominal pains, these became progressively worse and was severe today.  He was unable to go to work and came to the ER.  He endorses being weak, nauseous and vomiting today.  He also admits endorses being lightheaded and dizzy since yesterday.  He denies fever, chills, or hematemesis.  He states he seldom drinks.  This weekend he had some 12 ounces of beer.   Patient recently admitted 5/20 to 06/06/2022.  He was diagnosed with ESBL UTI.  At the time of discharge he was directed to take Cipro 25 mg p.o. 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  On reviewing patient's medication, it does not appear patient has been taking antibiotics.   In the ER ultrasound-guided paracentesis done.  Fluid was cloudy.  Total nucleated cell count 9, 210,  neutrophils 25.  Alcohol level 102 Tmax 100.1, HR 126 currently improved down to 110.  BP stable, RR 24 at presentation, currently 17.  Cultures collected.  WBC is 2.1, hemoglobin 10.5, platelets 35.  INR 2.8.  Patient given meropenem.   CT abdomen pelvis showed Again changes of chronic liver disease with nodular fatty liver with significant ascites. Patent portal vein. No enhancing liver lesion.   There is multifocal areas of wall thickening along the bowel which is nondilated. However the wall thickening along the colon appears out of proportion and increased from previous. Underlying colitis is possible.   Patient Spanish-speaking, translation line used.  Assessment & Plan:   Principal Problem:   SBP (spontaneous bacterial peritonitis) (HCC) s/t Decompensated  Hepatic Cirrhosis Active Problems:   Hypomagnesemia   Alcoholic cirrhosis of liver with ascites (HCC)   Hypokalemia   Alcohol use disorder   Macrocytic anemia   Pancytopenia (HCC)   Edema due to hypoalbuminemia   Tobacco abuse   Cirrhosis of liver (HCC)   Thrombocytopenia (HCC)   Decompensated hepatic cirrhosis (HCC)  SBP (spontaneous bacterial peritonitis) (HCC) -Presents presents with abdominal pain, recent ESBL E. coli bacterial peritonitis,  -Large-volume paracentesis on admission 6/13 with workup significant for SBP with 9210 nucleated cells .  Culture so far remains negative, continue with IV meropenem for next 24 hours, then can be transitioned back to his home dose p.o. Cipro prophylaxis at home -He is supposed to be on Cipro p.o. prophylaxis at home   AKI -Continue PT at 1.5 yesterday, concern for HUS, so I have discontinued his Lasix and Aldactone, and he was started on midodrine and albumin, creatinine trending down today which is reassuring.  Decompensated alcoholic liver cirrhosis  Hyperammonemia Coagulopathy Thrombocytopenia DM -He is still actively drinking, prognosis is extremely poor  -Continue with propranolol, lactulose -Please see above discussion regarding holding Aldactone and Lasix.   Alcohol use disorder -Alcohol level 102 -CIWA initiated -Continue thiamine, folate, MVI     Hypokalemia/hypocalcemia -Repleting    Hypomagnesemia -Repleting    Pancytopenia (HCC)/ Thrombocytopenia (HCC)/anemia -Secondary to liver cirrhosis -Patient denies bleeding in stool, emesis  -Transfuse platelets if less than 20 or if patient bleeding. -SCDs DVT prophylaxis -Hemoglobin is low at 7.4 today, will transfuse 1 unit PRBC.   Tobacco abuse -Nicotine patch placed  Metabolic acidosis secondary to infection/other issues -Sodium bicarb resumed, increase to 3 times daily       -Reducible umbilical hernia not a surgical candidate -Prior SMV thrombosis -Prior  UGIB 03/2023 + grade 2 varices Dr. Matthias Hughs  High risk for poor prognosis--MELD score portends 30 to 40% 33-month mortality -Maintained on lifelong ciprofloxacin, Lasix Aldactone 50: 20 ratio as well as bicarb -Palliative care/[facility] to follow patient in the outpatient as he is DNR  I have discussed with patient, and done today as well, I have informed him of the very poor prognosis, especially if he does not stop drinking, we are hopeful that he can be discharged tomorrow if renal function remained stable, hemoglobin remained stable.     DVT prophylaxis: SCD  Code Status: DNR Family Communication: Discussed with his aunt at bedside with his permission, Roberto Scales left her phone #[phone], on 6/15, and 6/16 disposition:   Status is: Inpatient    Consultants:  None   Subjective:  Reports he is feeling better today, eager to go home.  Objective: Vitals:   06/26/22 0035 06/26/22 0400 06/26/22 0922 06/26/22 1115  BP:  (!) 104/59 116/66 112/62  Pulse:  83 78 75  Resp:  17 20 17   Temp:  (!) 100.8 F (38.2 C) (!) 100.8 F (38.2 C) 99.4 F (37.4 C)  TempSrc:  Oral Oral Oral  SpO2: 92% 95%    Weight:      Height:        Intake/Output Summary (Last 24 hours) at 06/26/2022 1201 Last data filed at 06/25/2022 1642 Gross per 24 hour  Intake 1266.84 ml  Output --  Net 1266.84 ml   Filed Weights   06/23/22 1043  Weight: 77 kg    Examination:  Awake Alert, Oriented X 3, frail, ill-appearing, jaundiced Symmetrical Chest wall movement, Good air movement bilaterally, CTAB RRR,No Gallops,Rubs or new Murmurs, No Parasternal Heave +ve B.Sounds, Abd Soft, mild ascites, tenderness to palpation has improved No Cyanosis, Clubbing or edema, No new Rash or bruise        Data Reviewed: I have personally reviewed following labs and imaging studies  CBC: Recent Labs  Lab 06/23/22 1053 06/23/22 1152 06/24/22 0335 06/25/22 0250 06/26/22 0319  WBC 2.1*  --  5.7 5.4  3.1*  NEUTROABS 1.0*  --  4.8  --   --   HGB 10.5* 9.9* 9.8* 8.0* 7.4*  HCT 31.8* 29.0* 28.4* 23.2* 21.0*  MCV 107.8*  --  106.0* 102.7* 101.4*  PLT 35*  --  37* 28* 25*    Basic Metabolic Panel: Recent Labs  Lab 06/23/22 1053 06/23/22 1152 06/23/22 2252 06/24/22 0335 06/25/22 0250 06/26/22 0319  NA 137 140  --  131* 130* 127*  K 3.3* 3.4*  --  4.2 3.1* 3.1*  CL 108  --   --  103 103 102  CO2 15*  --   --  22 20* 18*  GLUCOSE 105*  --   --  115* 98 85  BUN <5*  --   --  8 20 26*  CREATININE 0.67  --   --  0.97 1.50* 1.19  CALCIUM 7.7*  --   --  7.4* 7.4* 7.3*  MG 1.6*  --   --  1.9  --   --   PHOS  --   --  3.5 3.3  --   --     GFR: Estimated Creatinine Clearance: 69.7 mL/min (by C-G formula based on SCr of  1.19 mg/dL).  Liver Function Tests: Recent Labs  Lab 06/23/22 1053 06/24/22 0335 06/25/22 0250 06/26/22 0319  AST 43* 33 22 20  ALT 18 17 11 11   ALKPHOS 92 70 47 44  BILITOT 3.8* 5.6* 5.1* 5.1*  PROT 5.4* 4.9* 4.6* 4.7*  ALBUMIN 2.6* 2.3* 2.5* 3.1*    CBG: No results for input(s): "GLUCAP" in the last 168 hours.   Recent Results (from the past 240 hour(s))  Body fluid culture w Gram Stain     Status: None   Collection Time: 06/23/22  4:50 PM   Specimen: Abdomen; Peritoneal Fluid  Result Value Ref Range Status   Specimen Description ABDOMEN PERITONEAL  Final   Special Requests NONE  Final   Gram Stain   Final    RARE WBC PRESENT,BOTH PMN AND MONONUCLEAR NO ORGANISMS SEEN    Culture   Final    NO GROWTH 3 DAYS Performed at [facility], [address] [place], [place] [address]    Report Status 06/26/2022 FINAL  Final         Radiology Studies: No results found.      Scheduled Meds:  ferrous sulfate  325 mg Oral Q breakfast   folic acid  1 mg Oral Daily   midodrine  10 mg Oral TID WC   multivitamin with minerals  1 tablet Oral Daily   pantoprazole  40 mg Oral Q0600   propranolol  10 mg Oral BID   sodium bicarbonate  650 mg  Oral BID   thiamine  100 mg Oral Daily   Or   thiamine  100 mg Intravenous Daily   Continuous Infusions:  albumin human 25 g (06/26/22 0926)   meropenem (MERREM) IV 1 g (06/26/22 0505)     LOS: 3 days        Huey Bienenstock, MD [facility]   To contact the attending provider between 7A-7P or the covering provider during after hours 7P-7A, please log into the web site [email or web address] and access using universal [place] password for that web site. If you do not have the password, please call the hospital operator.  06/26/2022, 12:01 PM

## 2022-06-26 NOTE — Progress Notes (Signed)
   06/26/22 1941  Assess: MEWS Score  Temp (!) 102.3 F (39.1 C)  BP 124/72  MAP (mmHg) 87  Pulse Rate 81  ECG Heart Rate 81  Resp 18  Level of Consciousness Alert  SpO2 98 %  O2 Device Room Air  Assess: MEWS Score  MEWS Temp 2  MEWS Systolic 0  MEWS Pulse 0  MEWS RR 0  MEWS LOC 0  MEWS Score 2  MEWS Score Color Yellow  Assess: if the MEWS score is Yellow or Red  Were vital signs taken at a resting state? Yes  Focused Assessment No change from prior assessment  Does the patient meet 2 or more of the SIRS criteria? No  Does the patient have a confirmed or suspected source of infection? Yes  MEWS guidelines implemented  Yes, yellow  Treat  MEWS Interventions Considered administering scheduled or prn medications/treatments as ordered  Take Vital Signs  Increase Vital Sign Frequency  Yellow: Q2hr x1, continue Q4hrs until patient remains green for 12hrs  Escalate  MEWS: Escalate Yellow: Discuss with charge nurse and consider notifying provider and/or RRT  Notify: Charge Nurse/RN  Name of Charge Nurse/RN Notified Nikki, RN  Assess: SIRS CRITERIA  SIRS Temperature  1  SIRS Pulse 0  SIRS Respirations  0  SIRS WBC 0  SIRS Score Sum  1

## 2022-06-27 DIAGNOSIS — R197 Diarrhea, unspecified: Secondary | ICD-10-CM

## 2022-06-27 DIAGNOSIS — F1990 Other psychoactive substance use, unspecified, uncomplicated: Secondary | ICD-10-CM

## 2022-06-27 DIAGNOSIS — F1090 Alcohol use, unspecified, uncomplicated: Secondary | ICD-10-CM

## 2022-06-27 DIAGNOSIS — R509 Fever, unspecified: Secondary | ICD-10-CM

## 2022-06-27 LAB — COMPREHENSIVE METABOLIC PANEL
ALT: 11 U/L (ref 0–44)
AST: 25 U/L (ref 15–41)
Albumin: 3.2 g/dL — ABNORMAL LOW (ref 3.5–5.0)
Alkaline Phosphatase: 39 U/L (ref 38–126)
Anion gap: 7 (ref 5–15)
BUN: 18 mg/dL (ref 6–20)
CO2: 18 mmol/L — ABNORMAL LOW (ref 22–32)
Calcium: 7.5 mg/dL — ABNORMAL LOW (ref 8.9–10.3)
Chloride: 102 mmol/L (ref 98–111)
Creatinine, Ser: 0.8 mg/dL (ref 0.61–1.24)
GFR, Estimated: >60 mL/min (ref >60)
Glucose, Bld: 105 mg/dL — ABNORMAL HIGH (ref 70–99)
Potassium: 3.3 mmol/L — ABNORMAL LOW (ref 3.5–5.1)
Sodium: 127 mmol/L — ABNORMAL LOW (ref 135–145)
Total Bilirubin: 4.6 mg/dL — ABNORMAL HIGH (ref 0.3–1.2)
Total Protein: 5 g/dL — ABNORMAL LOW (ref 6.5–8.1)

## 2022-06-27 LAB — CBC
HCT: 23.7 % — ABNORMAL LOW (ref 39.0–52.0)
Hemoglobin: 8.4 g/dL — ABNORMAL LOW (ref 13.0–17.0)
MCH: 36.1 pg — ABNORMAL HIGH (ref 26.0–34.0)
MCHC: 35.4 g/dL (ref 30.0–36.0)
MCV: 101.7 fL — ABNORMAL HIGH (ref 80.0–100.0)
Platelets: 24 10*3/uL — CL (ref 150–400)
RBC: 2.33 MIL/uL — ABNORMAL LOW (ref 4.22–5.81)
RDW: 15.5 % (ref 11.5–15.5)
WBC: 2.1 10*3/uL — ABNORMAL LOW (ref 4.0–10.5)
nRBC: 0 % (ref 0.0–0.2)

## 2022-06-27 LAB — BPAM RBC
Blood Product Expiration Date: 202407142359
ISSUE DATE / TIME: 202406161141
Unit Type and Rh: 5100

## 2022-06-27 LAB — TYPE AND SCREEN

## 2022-06-27 MED ORDER — POTASSIUM CHLORIDE CRYS ER 20 MEQ PO TBCR
40.0000 meq | EXTENDED_RELEASE_TABLET | Freq: Once | ORAL | Status: AC
  Administered 2022-06-27: 40 meq via ORAL
  Filled 2022-06-27: qty 2

## 2022-06-27 NOTE — Consult Note (Signed)
[facility]    Date of Admission:  06/23/2022   Total days of inpatient antibiotics 4        Reason for Consult: fever    Principal Problem:   SBP (spontaneous bacterial peritonitis) (HCC) s/t Decompensated Hepatic Cirrhosis Active Problems:   Hypokalemia   Alcoholic cirrhosis of liver with ascites (HCC)   Edema due to hypoalbuminemia   Pancytopenia (HCC)   Tobacco abuse   Cirrhosis of liver (HCC)   Alcohol use disorder   Thrombocytopenia (HCC)   Hypomagnesemia   Macrocytic anemia   Decompensated hepatic cirrhosis [facility])   Assessment: 42 year old male with history of EtOH abuse resulting in chronic thrombocytopenia, chronic SBP, hepatic encephalopathy on lactulose/Xifaxan prior SMV thrombosis admitted with:  #SBP on merrem as well as paracentesis on 6/13 cultures no growth. #Fever #Diarrhea #On ciprofloxacin for SBP PPX - He had recent admission discharged 5/27 to [facility] facility with abscess secondary to SBP due to ESBL E. coli treated with ertapenem discharged on ciprofloxacin SBP PPX(ESBL resistant) -Paracentesis with IR on 6/13 Cx NG.  Yielded 6.3L hazy dark yellow fluid.Studies, 9k wbc, 85% neurtophils - CT abdomen pelvis 6/13 showed multifocal areas of wall thickening along bowel which is nondilated.  Could be consistent with underlying colitis -He notes watery diarrhea since yesterday+ abdominal pain. On abx coming into hospitalizaiton (ciprofloxacin) Plan: -Will get C. difficile and GIP as cipro is associated with C. difficile colitis. Transition to ertapenem tomorrow - Obtain blood cultures  #Acitve drug use -Snorts drugs"call is dust"      Microbiology:   Antibiotics: Meropenem 6/13-present    HPI: Mathew [name] is a 42 y.o. male with past medical history of  alcohol liver cirrhosis, pancytopenia,, SBP on ciprofloxacin due to recent speculation for SBP secondary to ESBL E. coli presented with temp  100.1.  Underwent paracentesis cloudy fluid, total nucleated cell count 9K.  Neutrophils 25.  Alcohol level was 102.  WBC 0.1.  He was started on meropenem.  ID was engaged as cultures remain negative.  He noted that he was drinking over the weekend.   Review of Systems: Review of Systems  All other systems reviewed and are negative.   Past Medical History:  Diagnosis Date   Acute metabolic encephalopathy 06/20/2021   Alcohol withdrawal seizure (HCC)    Cirrhosis (HCC)    ETOH abuse    Pneumonia 04/02/2020   SBP (spontaneous bacterial peritonitis) (HCC) 06/17/2021    Social History   Tobacco Use   Smoking status: Every Day    Packs/day: .25    Types: Cigarettes    Start date: 08/11/2019   Smokeless tobacco: Never  Vaping Use   Vaping Use: Never used  Substance Use Topics   Alcohol use: Yes    Alcohol/week: 2.0 standard drinks of alcohol    Types: 2 Cans of beer per week    Comment: 3-6 beer cans a week   Drug use: No    Family History  Adopted: Yes  Problem Relation Age of Onset   Cancer Neg Hx    Heart disease Neg Hx    Scheduled Meds:  ferrous sulfate  325 mg Oral Q breakfast   folic acid  1 mg Oral Daily   midodrine  10 mg Oral TID WC   multivitamin with minerals  1 tablet Oral Daily   pantoprazole  40 mg Oral Q0600   propranolol  10 mg Oral BID  sodium bicarbonate  650 mg Oral BID   thiamine  100 mg Oral Daily   Continuous Infusions:  albumin human 25 g (06/27/22 1303)   meropenem (MERREM) IV 1 g (06/27/22 1258)   PRN Meds:.acetaminophen, melatonin, ondansetron **OR** ondansetron (ZOFRAN) IV No Known Allergies  OBJECTIVE: Blood pressure 109/77, pulse 73, temperature 99.9 F (37.7 C), temperature source Oral, resp. rate 20, height 5\' 1"  (1.549 m), weight 77 kg, SpO2 96 %.  Physical Exam Constitutional:      General: He is not in acute distress.    Appearance: He is normal weight. He is not toxic-appearing.  HENT:     Head: Normocephalic and  atraumatic.     Right Ear: External ear normal.     Left Ear: External ear normal.     Nose: No congestion or rhinorrhea.     Mouth/Throat:     Mouth: Mucous membranes are moist.     Pharynx: Oropharynx is clear.  Eyes:     General: Scleral icterus present.     Extraocular Movements: Extraocular movements intact.     Conjunctiva/sclera: Conjunctivae normal.     Pupils: Pupils are equal, round, and reactive to light.  Cardiovascular:     Rate and Rhythm: Normal rate and regular rhythm.     Heart sounds: No murmur heard.    No friction rub. No gallop.  Pulmonary:     Effort: Pulmonary effort is normal.     Breath sounds: Normal breath sounds.  Abdominal:     General: Abdomen is flat. Bowel sounds are normal.     Comments: Distended abdomen.  Musculoskeletal:        General: No swelling. Normal range of motion.     Cervical back: Normal range of motion and neck supple.  Skin:    General: Skin is warm and dry.     Coloration: Skin is jaundiced.  Neurological:     General: No focal deficit present.     Mental Status: He is oriented to person, place, and time.  Psychiatric:        Mood and Affect: Mood normal.     Lab Results Lab Results  Component Value Date   WBC 2.1 (L) 06/27/2022   HGB 8.4 (L) 06/27/2022   HCT 23.7 (L) 06/27/2022   MCV 101.7 (H) 06/27/2022   PLT 24 (LL) 06/27/2022    Lab Results  Component Value Date   CREATININE 0.80 06/27/2022   BUN 18 06/27/2022   NA 127 (L) 06/27/2022   K 3.3 (L) 06/27/2022   CL 102 06/27/2022   CO2 18 (L) 06/27/2022    Lab Results  Component Value Date   ALT 11 06/27/2022   AST 25 06/27/2022   ALKPHOS 39 06/27/2022   BILITOT 4.6 (H) 06/27/2022       Danelle Earthly, MD [facility] 06/27/2022, 3:25 PM   I have personally spent 85 minutes involved in face-to-face and non-face-to-face activities for this patient on the day of the visit. Professional time spent  includes the following activities: Preparing to see the patient (review of tests), Obtaining and/or reviewing separately obtained history (admission/discharge record), Performing a medically appropriate examination and/or evaluation , Ordering medications/tests/procedures, referring and communicating with other health care professionals, Documenting clinical information in the EMR, Independently interpreting results (not separately reported), Communicating results to the patient/family/caregiver, Counseling and educating the patient/family/caregiver and Care coordination (not separately reported).

## 2022-06-27 NOTE — Progress Notes (Signed)
PROGRESS NOTE        PATIENT DETAILS Name: Mathew [name] Age: 43 y.o. Sex: male Date of Birth: 1980-04-27 Admit Date: 06/23/2022 Admitting Physician Gery Pray, MD NWG:[id number], Charlcie Cradle, MD  Brief Summary: Patient is a 42 y.o.  male with history of alcoholic liver cirrhosis-recent history of spontaneous bacterial peritonitis due to ESBL E. coli-presented to the ED with abdominal pain, nausea, vomiting-paracentesis was consistent with SBP-patient was admitted to the hospitalist service.  Significant events: 5/20-5/27>> hospitalization for SBP-E. coli ESBL. 6/13>> admit to [facility]-abdominal pain with nausea/vomiting-paracentesis consistent with SBP.  Significant studies: 6/13>> CT abdomen/pelvis: Changes consistent with chronic liver disease-significant ascites.  Significant microbiology data: 5/21>> ascites fluid culture: ESBL E. coli 6/13>> ascites fluid culture: No growth (WBC-9210-85% neutrophils)  Procedures: 6/13>> paracentesis.  Consults: None  Subjective: Febrile last night.  Appears comfortable.  No nausea or vomiting.  Objective: Vitals: Blood pressure 125/73, pulse 81, temperature 97.9 F (36.6 C), temperature source Oral, resp. rate 18, height 5\' 1"  (1.549 m), weight 77 kg, SpO2 94 %.   Exam: Gen Exam:Alert awake-not in any distress HEENT:atraumatic, normocephalic Chest: B/L clear to auscultation anteriorly CVS:S1S2 regular Abdomen:soft non tender Extremities:no edema Neurology: Non focal Skin: no rash  Pertinent Labs/Radiology:    Latest Ref Rng & Units 06/27/2022    3:14 AM 06/26/2022    3:19 AM 06/25/2022    2:50 AM  CBC  WBC 4.0 - 10.5 K/uL 2.1  3.1  5.4   Hemoglobin 13.0 - 17.0 g/dL 8.4  7.4  8.0   Hematocrit 39.0 - 52.0 % 23.7  21.0  23.2   Platelets 150 - 400 K/uL 24  25  28      Lab Results  Component Value Date   NA 127 (L) 06/27/2022   K 3.3 (L) 06/27/2022   CL 102 06/27/2022   CO2 18 (L) 06/27/2022       Assessment/Plan: Spontaneous bacterial peritonitis Although clinically improved-no abdominal pain-still febrile as of yesterday evening.  Does not appear to have any other sources of infection clinically-however will check UA, and do blood cultures.  Continue meropenem.  Will discuss with infectious disease.    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  Hypokalemia Likely due to EtOH use Replete/recheck  Hyponatremia Mild Secondary to volume overload/cirrhosis Holding diuretics for now  Obesity: Estimated body mass index is 32.07 kg/m as calculated from the following:   Height as of this encounter: 5\' 1"  (1.549 m).   Weight as of this encounter: 77 kg.   Code status:   Code Status: DNR   DVT Prophylaxis: SCDs Start: 06/23/22 2055   Family Communication: None at bedside   Disposition Plan: Status is: Inpatient Remains inpatient appropriate because: Severity of illness   Planned Discharge Destination:Home   Diet: Diet Order             Diet Heart Room service appropriate? Yes; Fluid consistency: Thin; Fluid restriction: 1800 mL Fluid  Diet effective now  Antimicrobial agents: Anti-infectives (From admission, onward)    Start     Dose/Rate Route Frequency Ordered Stop   06/23/22 2200  meropenem (MERREM) 2 g in sodium chloride 0.9 % 100 mL IVPB  Status:  Discontinued        2 g 280 mL/hr over  30 Minutes Intravenous Every 8 hours 06/23/22 1931 06/23/22 1932   06/23/22 2200  meropenem (MERREM) 1 g in sodium chloride 0.9 % 100 mL IVPB        1 g 200 mL/hr over 30 Minutes Intravenous Every 8 hours 06/23/22 2059     06/23/22 1945  ertapenem (INVANZ) 1,000 mg in sodium chloride 0.9 % 100 mL IVPB  Status:  Discontinued        1 g 200 mL/hr over 30 Minutes Intravenous  Once 06/23/22 1932 06/23/22 2059   06/23/22 1915  cefTRIAXone (ROCEPHIN) 2 g in sodium chloride 0.9 % 100 mL IVPB  Status:  Discontinued        2 g 200 mL/hr over 30 Minutes Intravenous  Once 06/23/22 1906 06/23/22 1907   06/23/22 1915  ertapenem (INVANZ) 1,000 mg in sodium chloride 0.9 % 100 mL IVPB  Status:  Discontinued        1 g 200 mL/hr over 30 Minutes Intravenous  Once 06/23/22 1908 06/23/22 1931        MEDICATIONS: Scheduled Meds:  ferrous sulfate  325 mg Oral Q breakfast   folic acid  1 mg Oral Daily   midodrine  10 mg Oral TID WC   multivitamin with minerals  1 tablet Oral Daily   pantoprazole  40 mg Oral Q0600   potassium chloride  40 mEq Oral Once   propranolol  10 mg Oral BID   sodium bicarbonate  650 mg Oral BID   thiamine  100 mg Oral Daily   Or   thiamine  100 mg Intravenous Daily   Continuous Infusions:  albumin human 25 g (06/27/22 0323)   meropenem (MERREM) IV 1 g (06/27/22 0630)   PRN Meds:.acetaminophen, melatonin, ondansetron **OR** ondansetron (ZOFRAN) IV   I have personally reviewed following labs and imaging studies  LABORATORY DATA: CBC: Recent Labs  Lab 06/23/22 1053 06/23/22 1152 06/24/22 0335 06/25/22 0250 06/26/22 0319 06/27/22 0314  WBC 2.1*  --  5.7 5.4 3.1* 2.1*  NEUTROABS 1.0*  --  4.8  --   --   --   HGB 10.5* 9.9* 9.8* 8.0* 7.4* 8.4*  HCT 31.8* 29.0* 28.4* 23.2* 21.0* 23.7*  MCV 107.8*  --  106.0* 102.7* 101.4* 101.7*  PLT 35*  --  37* 28* 25* 24*    Basic Metabolic Panel: Recent Labs  Lab 06/23/22 1053 06/23/22 1152 06/23/22 2252 06/24/22 0335  06/25/22 0250 06/26/22 0319 06/27/22 0314  NA 137 140  --  131* 130* 127* 127*  K 3.3* 3.4*  --  4.2 3.1* 3.1* 3.3*  CL 108  --   --  103 103 102 102  CO2 15*  --   --  22 20* 18* 18*  GLUCOSE 105*  --   --  115* 98 85 105*  BUN <5*  --   --  8 20 26* 18  CREATININE 0.67  --   --  0.97 1.50* 1.19 0.80  CALCIUM 7.7*  --   --  7.4* 7.4* 7.3* 7.5*  MG 1.6*  --   --  1.9  --   --   --   PHOS  --   --  3.5 3.3  --   --   --     GFR: Estimated Creatinine Clearance: 103.7 mL/min (by C-G formula based on SCr of 0.8 mg/dL).  Liver Function Tests: Recent Labs  Lab 06/23/22 1053 06/24/22 0335 06/25/22 0250 06/26/22 0319 06/27/22 0314  AST 43* 33 22 20 25   ALT 18 17 11 11 11   ALKPHOS 92 70 47 44 39  BILITOT 3.8* 5.6* 5.1* 5.1* 4.6*  PROT 5.4* 4.9* 4.6* 4.7* 5.0*  ALBUMIN 2.6* 2.3* 2.5* 3.1* 3.2*   Recent Labs  Lab 06/23/22 1053  LIPASE 32   Recent Labs  Lab 06/23/22 1053  AMMONIA 42*    Coagulation Profile: Recent Labs  Lab 06/23/22 2252 06/24/22 0335  INR 2.8* 2.8*    Cardiac Enzymes: No results for input(s): "CKTOTAL", "CKMB", "CKMBINDEX", "TROPONINI" in the last 168 hours.  BNP (last 3 results) No results for input(s): "PROBNP" in the last 8760 hours.  Lipid Profile: No results for input(s): "CHOL", "HDL", "LDLCALC", "TRIG", "CHOLHDL", "LDLDIRECT" in the last 72 hours.  Thyroid Function Tests: No results for input(s): "TSH", "T4TOTAL", "FREET4", "T3FREE", "THYROIDAB" in the last 72 hours.  Anemia Panel: No results for input(s): "VITAMINB12", "FOLATE", "FERRITIN", "TIBC", "IRON", "RETICCTPCT" in the last 72 hours.  Urine analysis:    Component Value Date/Time   COLORURINE AMBER (A) 05/31/2022 0445   APPEARANCEUR HAZY (A) 05/31/2022 0445   LABSPEC 1.013 05/31/2022 0445   PHURINE 5.0 05/31/2022 0445   GLUCOSEU NEGATIVE 05/31/2022 0445   HGBUR LARGE (A) 05/31/2022 0445   BILIRUBINUR SMALL (A) 05/31/2022 0445   KETONESUR NEGATIVE 05/31/2022 0445    PROTEINUR 30 (A) 05/31/2022 0445   UROBILINOGEN 0.2 08/02/2014 2045   NITRITE NEGATIVE 05/31/2022 0445   LEUKOCYTESUR NEGATIVE 05/31/2022 0445    Sepsis Labs: Lactic Acid, Venous    Component Value Date/Time   LATICACIDVEN 2.0 (HH) 05/31/2022 0141    MICROBIOLOGY: Recent Results (from the past 240 hour(s))  Body fluid culture w Gram Stain     Status: None   Collection Time: 06/23/22  4:50 PM   Specimen: Abdomen; Peritoneal Fluid  Result Value Ref Range Status   Specimen Description ABDOMEN PERITONEAL  Final   Special Requests NONE  Final   Gram Stain   Final    RARE WBC PRESENT,BOTH PMN AND MONONUCLEAR NO ORGANISMS SEEN    Culture   Final    NO GROWTH 3 DAYS Performed at [facility], [address], [place], [place] [address]    Report Status 06/26/2022 FINAL  Final    RADIOLOGY STUDIES/RESULTS: No results found.   LOS: 4 days   Jeoffrey Massed, MD  [facility]    To contact the attending provider between 7A-7P or the covering provider during after hours 7P-7A, please log into the web site [email or web address] and access using universal [place] password for that web site. If you do not have the password, please call the hospital operator.  06/27/2022, 8:52 AM

## 2022-06-28 LAB — CBC
HCT: 24.7 % — ABNORMAL LOW (ref 39.0–52.0)
Hemoglobin: 8.7 g/dL — ABNORMAL LOW (ref 13.0–17.0)
MCH: 34.7 pg — ABNORMAL HIGH (ref 26.0–34.0)
MCHC: 35.2 g/dL (ref 30.0–36.0)
MCV: 98.4 fL (ref 80.0–100.0)
Platelets: 21 10*3/uL — CL (ref 150–400)
RBC: 2.51 MIL/uL — ABNORMAL LOW (ref 4.22–5.81)
RDW: 14.7 % (ref 11.5–15.5)
WBC: 2.3 10*3/uL — ABNORMAL LOW (ref 4.0–10.5)
nRBC: 0 % (ref 0.0–0.2)

## 2022-06-28 LAB — COMPREHENSIVE METABOLIC PANEL
ALT: 12 U/L (ref 0–44)
AST: 27 U/L (ref 15–41)
Albumin: 3.8 g/dL (ref 3.5–5.0)
Alkaline Phosphatase: 44 U/L (ref 38–126)
Anion gap: 9 (ref 5–15)
BUN: 12 mg/dL (ref 6–20)
CO2: 18 mmol/L — ABNORMAL LOW (ref 22–32)
Calcium: 8 mg/dL — ABNORMAL LOW (ref 8.9–10.3)
Chloride: 102 mmol/L (ref 98–111)
Creatinine, Ser: 0.73 mg/dL (ref 0.61–1.24)
GFR, Estimated: >60 mL/min (ref >60)
Glucose, Bld: 87 mg/dL (ref 70–99)
Potassium: 3.8 mmol/L (ref 3.5–5.1)
Sodium: 129 mmol/L — ABNORMAL LOW (ref 135–145)
Total Bilirubin: 3.8 mg/dL — ABNORMAL HIGH (ref 0.3–1.2)
Total Protein: 5.5 g/dL — ABNORMAL LOW (ref 6.5–8.1)

## 2022-06-28 MED ORDER — SPIRONOLACTONE 25 MG PO TABS
50.0000 mg | ORAL_TABLET | Freq: Every day | ORAL | Status: DC
  Administered 2022-06-28 – 2022-07-02 (×5): 50 mg via ORAL
  Filled 2022-06-28 (×5): qty 2

## 2022-06-28 MED ORDER — FUROSEMIDE 20 MG PO TABS
20.0000 mg | ORAL_TABLET | Freq: Every day | ORAL | Status: DC
  Administered 2022-06-28 – 2022-07-02 (×5): 20 mg via ORAL
  Filled 2022-06-28 (×5): qty 1

## 2022-06-28 NOTE — Progress Notes (Signed)
PROGRESS NOTE        PATIENT DETAILS Name: Mathew [name] Age: 42 y.o. Sex: male Date of Birth: [birth year] Admit Date: 06/23/2022 Admitting Physician Gery Pray, MD ZOX:[id number], Charlcie Cradle, MD  Brief Summary: Patient is a 42 y.o.  male with history of alcoholic liver cirrhosis-recent history of spontaneous bacterial peritonitis due to ESBL E. coli-presented to the ED with abdominal pain, nausea, vomiting-paracentesis was consistent with SBP-patient was admitted to the hospitalist service.  Significant events: 5/20-5/27>> hospitalization for SBP-E. coli ESBL. 6/13>> admit to [facility]-abdominal pain with nausea/vomiting-paracentesis consistent with SBP.  Significant studies: 6/13>> CT abdomen/pelvis: Changes consistent with chronic liver disease-significant ascites.  Significant microbiology data: 5/21>> ascites fluid culture: ESBL E. coli 6/13>> ascites fluid culture: No growth (WBC-9210-85% neutrophils)  Procedures: 6/13>> paracentesis.  Consults: None  Subjective: Afebrile over night-no major issues.  Feels better.  Discussed with nursing staff-no diarrhea.  Objective: Vitals: Blood pressure 104/61, pulse 71, temperature 98.7 F (37.1 C), temperature source Oral, resp. rate 18, height 5\' 1"  (1.549 m), weight 77 kg, SpO2 100 %.   Exam: Gen Exam:Alert awake-not in any distress HEENT:atraumatic, normocephalic Chest: B/L clear to auscultation anteriorly CVS:S1S2 regular Abdomen:soft non tender, non distended Extremities:no edema Neurology: + focal Skin: no rash  Pertinent Labs/Radiology:    Latest Ref Rng & Units 06/28/2022    3:32 AM 06/27/2022    3:14 AM 06/26/2022    3:19 AM  CBC  WBC 4.0 - 10.5 K/uL 2.3  2.1  3.1   Hemoglobin 13.0 - 17.0 g/dL 8.7  8.4  7.4   Hematocrit 39.0 - 52.0 % 24.7  23.7  21.0   Platelets 150 - 400 K/uL 21  24  25      Lab Results  Component Value Date   NA 129 (L) 06/28/2022   K 3.8 06/28/2022   CL  102 06/28/2022   CO2 18 (L) 06/28/2022      Assessment/Plan: Spontaneous bacterial peritonitis Thankfully no further fever Appreciate ID input Remains on meropenem with plans to change to ertapenem per ID. Will need to be placed back on ciprofloxacin for prophylaxis as on discharge.  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  Hypokalemia Likely due to EtOH use Replete/recheck  Hyponatremia Mild Secondary to volume overload/cirrhosis Holding diuretics for now  Obesity: Estimated body mass index is 32.07 kg/m as calculated from the following:   Height as of this encounter: 5\' 1"  (1.549 m).   Weight as of this encounter: 77 kg.   Code status:   Code Status: DNR   DVT Prophylaxis: SCDs Start: 06/23/22 2055   Family Communication: None at bedside   Disposition Plan: Status is: Inpatient Remains inpatient appropriate because: Severity of illness   Planned Discharge Destination:Home   Diet: Diet Order             Diet Heart Room service appropriate? Yes; Fluid consistency: Thin; Fluid restriction: 1800 mL Fluid  Diet effective now  Antimicrobial agents: Anti-infectives (From admission, onward)    Start     Dose/Rate Route Frequency Ordered Stop   06/23/22 2200  meropenem (MERREM) 2 g in sodium chloride 0.9 % 100 mL IVPB  Status:  Discontinued        2 g 280 mL/hr over 30 Minutes Intravenous Every 8 hours 06/23/22 1931 06/23/22 1932   06/23/22 2200   meropenem (MERREM) 1 g in sodium chloride 0.9 % 100 mL IVPB        1 g 200 mL/hr over 30 Minutes Intravenous Every 8 hours 06/23/22 2059     06/23/22 1945  ertapenem (INVANZ) 1,000 mg in sodium chloride 0.9 % 100 mL IVPB  Status:  Discontinued        1 g 200 mL/hr over 30 Minutes Intravenous  Once 06/23/22 1932 06/23/22 2059   06/23/22 1915  cefTRIAXone (ROCEPHIN) 2 g in sodium chloride 0.9 % 100 mL IVPB  Status:  Discontinued        2 g 200 mL/hr over 30 Minutes Intravenous  Once 06/23/22 1906 06/23/22 1907   06/23/22 1915  ertapenem (INVANZ) 1,000 mg in sodium chloride 0.9 % 100 mL IVPB  Status:  Discontinued        1 g 200 mL/hr over 30 Minutes Intravenous  Once 06/23/22 1908 06/23/22 1931        MEDICATIONS: Scheduled Meds:  ferrous sulfate  325 mg Oral Q breakfast   folic acid  1 mg Oral Daily   midodrine  10 mg Oral TID WC   multivitamin with minerals  1 tablet Oral Daily   pantoprazole  40 mg Oral Q0600   propranolol  10 mg Oral BID   sodium bicarbonate  650 mg Oral BID   thiamine  100 mg Oral Daily   Continuous Infusions:  albumin human 25 g (06/28/22 0925)   meropenem (MERREM) IV 1 g (06/28/22 0545)   PRN Meds:.acetaminophen, melatonin, ondansetron **OR** ondansetron (ZOFRAN) IV   I have personally reviewed following labs and imaging studies  LABORATORY DATA: CBC: Recent Labs  Lab 06/23/22 1053 06/23/22 1152 06/24/22 0335 06/25/22 0250 06/26/22 0319 06/27/22 0314 06/28/22 0332  WBC 2.1*  --  5.7 5.4 3.1* 2.1* 2.3*  NEUTROABS 1.0*  --  4.8  --   --   --   --   HGB 10.5*   < > 9.8* 8.0* 7.4* 8.4* 8.7*  HCT 31.8*   < > 28.4* 23.2* 21.0* 23.7* 24.7*  MCV 107.8*  --  106.0* 102.7* 101.4* 101.7* 98.4  PLT 35*  --  37* 28* 25* 24* 21*   < > = values in this interval not displayed.     Basic Metabolic Panel: Recent Labs  Lab 06/23/22 1053 06/23/22 1152 06/23/22 2252 06/24/22 0335 06/25/22 0250 06/26/22 0319 06/27/22 0314 06/28/22 0332  NA 137   <  >  --  131* 130* 127* 127* 129*  K 3.3*   < >  --  4.2 3.1* 3.1* 3.3* 3.8  CL 108  --   --  103 103 102 102 102  CO2 15*  --   --  22 20* 18* 18* 18*  GLUCOSE 105*  --   --  115* 98 85 105* 87  BUN <5*  --   --  8 20 26* 18 12  CREATININE 0.67  --   --  0.97 1.50* 1.19 0.80 0.73  CALCIUM 7.7*  --   --  7.4* 7.4* 7.3* 7.5* 8.0*  MG 1.6*  --   --  1.9  --   --   --   --   PHOS  --   --  3.5 3.3  --   --   --   --    < > = values in this interval not displayed.     GFR: Estimated Creatinine Clearance: 103.7 mL/min (by C-G formula based on SCr of 0.73 mg/dL).  Liver Function Tests: Recent Labs  Lab 06/24/22 0335 06/25/22 0250 06/26/22 0319 06/27/22 0314 06/28/22 0332  AST 33 22 20 25 27   ALT 17 11 11 11 12   ALKPHOS 70 47 44 39 44  BILITOT 5.6* 5.1* 5.1* 4.6* 3.8*  PROT 4.9* 4.6* 4.7* 5.0* 5.5*  ALBUMIN 2.3* 2.5* 3.1* 3.2* 3.8    Recent Labs  Lab 06/23/22 1053  LIPASE 32    Recent Labs  Lab 06/23/22 1053  AMMONIA 42*     Coagulation Profile: Recent Labs  Lab 06/23/22 2252 06/24/22 0335  INR 2.8* 2.8*     Cardiac Enzymes: No results for input(s): "CKTOTAL", "CKMB", "CKMBINDEX", "TROPONINI" in the last 168 hours.  BNP (last 3 results) No results for input(s): "PROBNP" in the last 8760 hours.  Lipid Profile: No results for input(s): "CHOL", "HDL", "LDLCALC", "TRIG", "CHOLHDL", "LDLDIRECT" in the last 72 hours.  Thyroid Function Tests: No results for input(s): "TSH", "T4TOTAL", "FREET4", "T3FREE", "THYROIDAB" in the last 72 hours.  Anemia Panel: No results for input(s): "VITAMINB12", "FOLATE", "FERRITIN", "TIBC", "IRON", "RETICCTPCT" in the last 72 hours.  Urine analysis:    Component Value Date/Time   COLORURINE AMBER (A) 05/31/2022 0445   APPEARANCEUR HAZY (A) 05/31/2022 0445   LABSPEC 1.013 05/31/2022 0445   PHURINE 5.0 05/31/2022 0445   GLUCOSEU NEGATIVE 05/31/2022 0445   HGBUR LARGE (A) 05/31/2022 0445   BILIRUBINUR SMALL (A) 05/31/2022 0445    KETONESUR NEGATIVE 05/31/2022 0445   PROTEINUR 30 (A) 05/31/2022 0445   UROBILINOGEN 0.2 08/02/2014 2045   NITRITE NEGATIVE 05/31/2022 0445   LEUKOCYTESUR NEGATIVE 05/31/2022 0445    Sepsis Labs: Lactic Acid, Venous    Component Value Date/Time   LATICACIDVEN 2.0 (HH) 05/31/2022 0141    MICROBIOLOGY: Recent Results (from the past 240 hour(s))  Body fluid culture w Gram Stain     Status: None   Collection Time: 06/23/22  4:50 PM   Specimen: Abdomen; Peritoneal Fluid  Result Value Ref Range Status   Specimen Description ABDOMEN PERITONEAL  Final   Special Requests NONE  Final   Gram Stain   Final    RARE WBC PRESENT,BOTH PMN AND MONONUCLEAR NO ORGANISMS SEEN    Culture   Final    NO GROWTH 3 DAYS Performed at [facility], [address] [place], [place] [address]    Report Status 06/26/2022 FINAL  Final  Culture, blood (Routine X 2) w Reflex to ID Panel     Status: None (Preliminary result)   Collection Time: 06/27/22  9:18 AM   Specimen: BLOOD  Result Value Ref Range Status   Specimen Description BLOOD RIGHT ANTECUBITAL  Final   Special Requests   Final    BOTTLES DRAWN AEROBIC AND ANAEROBIC Blood Culture adequate volume   Culture   Final    NO GROWTH < 24 HOURS Performed at [facility], [address] [place], [place] [address]    Report Status PENDING  Incomplete  Culture, blood (Routine X 2) w Reflex to ID Panel  Status: None (Preliminary result)   Collection Time: 06/27/22  9:18 AM   Specimen: BLOOD RIGHT HAND  Result Value Ref Range Status   Specimen Description BLOOD RIGHT HAND  Final   Special Requests   Final    BOTTLES DRAWN AEROBIC AND ANAEROBIC Blood Culture adequate volume   Culture   Final    NO GROWTH < 24 HOURS Performed at [facility], [address] [place], [place] [address]    Report Status PENDING  Incomplete    RADIOLOGY STUDIES/RESULTS: No results found.   LOS: 5 days   Jeoffrey Massed, MD  [facility]    To contact the attending provider between 7A-7P or the covering provider during after hours 7P-7A, please log into the web site [email or web address] and access using universal [place] password for that web site. If you do not have the password, please call the hospital operator.  06/28/2022, 12:26 PM

## 2022-06-28 NOTE — Progress Notes (Signed)
[facility]  Date of Admission:  06/23/2022   Total days of inpatient antibiotics 5  Principal Problem:   SBP (spontaneous bacterial peritonitis) (HCC) s/t Decompensated Hepatic Cirrhosis Active Problems:   Hypokalemia   Alcoholic cirrhosis of liver with ascites (HCC)   Edema due to hypoalbuminemia   Pancytopenia (HCC)   Tobacco abuse   Cirrhosis of liver (HCC)   Alcohol use disorder   Thrombocytopenia (HCC)   Hypomagnesemia   Macrocytic anemia   Decompensated hepatic cirrhosis [facility])          Assessment: 42 year old male with history of EtOH abuse 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 #Fever #Diarrhea #On ciprofloxacin for SBP PPX - He had recent admission discharged 5/27 to [facility] facility with abscess secondary to SBP due to ESBL E. coli treated with ertapenem discharged on ciprofloxacin SBP PPX(ESBL resistant) -Paracentesis with IR on 6/13 Cx NG.  Yielded 6.3L hazy dark yellow fluid.Studies, 9k wbc, 85% neurtophils.  Cultures negative - CT abdomen pelvis 6/13 showed multifocal areas of wall thickening along bowel which is nondilated.  Could be consistent with underlying colitis -Spoke with patient again in regards to his diarrhea.  States that somewhat improved today, and abdominal pain is almost resolved.  On further questioning today, patient relates had diarrhea on and off for few months.  Noted to be on lactulose and cephalexin for hepatic encephalopathy which is likely the cause of her diarrhea.  As his fever, abdominal pain have resolved I have low index of suspicion for C. difficile or infectious diarrhea.  As such we will DC testing at this point. Plan: -Continue meropenem to complete 5 days of antibiotics EOT 6/21.  Not a home PICC line candidate. - After completion of  IV therapy can transition down to Bactrim.  Noted that previous ESBL E. coli is resistant to Bactrim would prefer some sort of prophylaxis.  Avoid Cipro as it is a higher chance of C. difficile.   #Acitve drug use -Snorts drugs"call is dust"  Infectious disease will sign off  Microbiology:   Antibiotics: Merrem 6/13- Cultures: Blood 6/17 pending Paracentesis 6/13 NG  SUBJECTIVE: Reports abdominal pain has improved Interval: Afebrile overnight   Review of Systems: Review of Systems  All other systems reviewed and are negative.    Scheduled Meds:  ferrous sulfate  325 mg Oral Q breakfast   folic acid  1 mg Oral Daily   furosemide  20 mg Oral Daily   midodrine  10 mg Oral TID WC   multivitamin with minerals  1 tablet Oral Daily   pantoprazole  40 mg Oral Q0600   propranolol  10 mg Oral BID   sodium bicarbonate  650 mg Oral BID   spironolactone  50 mg Oral Daily   thiamine  100 mg Oral Daily   Continuous Infusions:  albumin human 25 g (06/28/22 0925)   meropenem (MERREM) IV 1 g (06/28/22 0545)   PRN Meds:.acetaminophen, melatonin, ondansetron **OR** ondansetron (ZOFRAN) IV No Known Allergies  OBJECTIVE: Vitals:   06/27/22 1916 06/28/22 0441 06/28/22 0500 06/28/22 0800  BP: 115/77 113/64 113/64 104/61  Pulse: 70   71  Resp: 19 (!) 22  18  Temp: 98.8 F (37.1 C)  98.7 F (37.1 C)   TempSrc: Oral  Oral   SpO2: 100%  Weight:      Height:       Body mass index is 32.07 kg/m.  Physical Exam Constitutional:      General: He is not in acute distress.    Appearance: He is normal weight. He is not toxic-appearing.  HENT:     Head: Normocephalic and atraumatic.     Right Ear: External ear normal.     Left Ear: External ear normal.     Nose: No congestion or rhinorrhea.     Mouth/Throat:     Mouth: Mucous membranes are moist.     Pharynx: Oropharynx is clear.  Eyes:     Extraocular Movements: Extraocular movements intact.     Conjunctiva/sclera:  Conjunctivae normal.     Pupils: Pupils are equal, round, and reactive to light.  Cardiovascular:     Rate and Rhythm: Normal rate and regular rhythm.     Heart sounds: No murmur heard.    No friction rub. No gallop.  Pulmonary:     Effort: Pulmonary effort is normal.     Breath sounds: Normal breath sounds.  Abdominal:     General: Abdomen is flat. Bowel sounds are normal.     Comments: Distended  Musculoskeletal:        General: No swelling. Normal range of motion.     Cervical back: Normal range of motion and neck supple.  Skin:    General: Skin is warm and dry.  Neurological:     General: No focal deficit present.     Mental Status: He is oriented to person, place, and time.  Psychiatric:        Mood and Affect: Mood normal.       Lab Results Lab Results  Component Value Date   WBC 2.3 (L) 06/28/2022   HGB 8.7 (L) 06/28/2022   HCT 24.7 (L) 06/28/2022   MCV 98.4 06/28/2022   PLT 21 (LL) 06/28/2022    Lab Results  Component Value Date   CREATININE 0.73 06/28/2022   BUN 12 06/28/2022   NA 129 (L) 06/28/2022   K 3.8 06/28/2022   CL 102 06/28/2022   CO2 18 (L) 06/28/2022    Lab Results  Component Value Date   ALT 12 06/28/2022   AST 27 06/28/2022   ALKPHOS 44 06/28/2022   BILITOT 3.8 (H) 06/28/2022        Danelle Earthly, MD [facility] 06/28/2022, 1:38 PM   I have personally spent 56 minutes involved in face-to-face and non-face-to-face activities for this patient on the day of the visit. Professional time spent includes the following activities: Preparing to see the patient (review of tests), Obtaining and/or reviewing separately obtained history (admission/discharge record), Performing a medically appropriate examination and/or evaluation , Ordering medications/tests/procedures, referring and communicating with other health care professionals, Documenting clinical information in the EMR, Independently  interpreting results (not separately reported), Communicating results to the patient/family/caregiver, Counseling and educating the patient/family/caregiver and Care coordination (not separately reported).

## 2022-06-29 ENCOUNTER — Other Ambulatory Visit (HOSPITAL_COMMUNITY): Payer: Self-pay

## 2022-06-29 LAB — COMPREHENSIVE METABOLIC PANEL
ALT: 12 U/L (ref 0–44)
AST: 28 U/L (ref 15–41)
Albumin: 4 g/dL (ref 3.5–5.0)
Alkaline Phosphatase: 56 U/L (ref 38–126)
Anion gap: 7 (ref 5–15)
BUN: 11 mg/dL (ref 6–20)
CO2: 19 mmol/L — ABNORMAL LOW (ref 22–32)
Calcium: 8.4 mg/dL — ABNORMAL LOW (ref 8.9–10.3)
Chloride: 106 mmol/L (ref 98–111)
Creatinine, Ser: 0.69 mg/dL (ref 0.61–1.24)
GFR, Estimated: >60 mL/min (ref >60)
Glucose, Bld: 110 mg/dL — ABNORMAL HIGH (ref 70–99)
Potassium: 3.8 mmol/L (ref 3.5–5.1)
Sodium: 132 mmol/L — ABNORMAL LOW (ref 135–145)
Total Bilirubin: 3.6 mg/dL — ABNORMAL HIGH (ref 0.3–1.2)
Total Protein: 5.3 g/dL — ABNORMAL LOW (ref 6.5–8.1)

## 2022-06-29 LAB — CBC
HCT: 24.3 % — ABNORMAL LOW (ref 39.0–52.0)
Hemoglobin: 8.6 g/dL — ABNORMAL LOW (ref 13.0–17.0)
MCH: 35.1 pg — ABNORMAL HIGH (ref 26.0–34.0)
MCHC: 35.4 g/dL (ref 30.0–36.0)
MCV: 99.2 fL (ref 80.0–100.0)
Platelets: 22 10*3/uL — CL (ref 150–400)
RBC: 2.45 MIL/uL — ABNORMAL LOW (ref 4.22–5.81)
RDW: 15.3 % (ref 11.5–15.5)
WBC: 1.6 10*3/uL — ABNORMAL LOW (ref 4.0–10.5)
nRBC: 0 % (ref 0.0–0.2)

## 2022-06-29 LAB — CULTURE, BLOOD (ROUTINE X 2)

## 2022-06-29 MED ORDER — SULFAMETHOXAZOLE-TRIMETHOPRIM 800-160 MG PO TABS
1.0000 | ORAL_TABLET | Freq: Every day | ORAL | 2 refills | Status: DC
  Filled 2022-06-29: qty 30, 30d supply, fill #0

## 2022-06-29 NOTE — Progress Notes (Signed)
PROGRESS NOTE        PATIENT DETAILS Name: Mathew [name] Age: 42 y.o. Sex: male Date of Birth: May 06, 1980 Admit Date: 06/23/2022 Admitting Physician Gery Pray, MD ZOX:[id number], Charlcie Cradle, MD  Brief Summary: Patient is a 42 y.o.  male with history of alcoholic liver cirrhosis-recent history of spontaneous bacterial peritonitis due to ESBL E. coli-presented to the ED with abdominal pain, nausea, vomiting-paracentesis was consistent with SBP-patient was admitted to the hospitalist service.  Significant events: 5/20-5/27>> hospitalization for SBP-E. coli ESBL. 6/13>> admit to [facility]-abdominal pain with nausea/vomiting-paracentesis consistent with SBP.  Significant studies: 6/13>> CT abdomen/pelvis: Changes consistent with chronic liver disease-significant ascites.  Significant microbiology data: 5/21>> ascites fluid culture: ESBL E. coli 6/13>> ascites fluid culture: No growth (WBC-9210-85% neutrophils)  Procedures: 6/13>> paracentesis.  Consults: None  Subjective: No diarrhea otherwise stable.  Some mild intermittent abdominal pain-appears comfortable.  Objective: Vitals: Blood pressure 106/68, pulse 78, temperature 98.5 F (36.9 C), temperature source Oral, resp. rate 18, height 5\' 1"  (1.549 m), weight 77 kg, SpO2 96 %.   Exam: Gen Exam:Alert awake-not in any distress HEENT:atraumatic, normocephalic Chest: B/L clear to auscultation anteriorly CVS:S1S2 regular Abdomen:soft non tender, non distended Extremities:+ edema Neurology: Non focal Skin: no rash  Pertinent Labs/Radiology:    Latest Ref Rng & Units 06/29/2022    3:25 AM 06/28/2022    3:32 AM 06/27/2022    3:14 AM  CBC  WBC 4.0 - 10.5 K/uL 1.6  2.3  2.1   Hemoglobin 13.0 - 17.0 g/dL 8.6  8.7  8.4   Hematocrit 39.0 - 52.0 % 24.3  24.7  23.7   Platelets 150 - 400 K/uL 22  21  24      Lab Results  Component Value Date   NA 132 (L) 06/29/2022   K 3.8 06/29/2022   CL 106  06/29/2022   CO2 19 (L) 06/29/2022      Assessment/Plan: Spontaneous bacterial peritonitis No further fever for 48 hours Discussed with ID-meropenem until 6/21-afterwards transition to Bactrim for prophylaxis.  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  Hypokalemia Likely due to EtOH use Replete/recheck  Hyponatremia Mild Secondary to volume overload/cirrhosis-improving resumption of diuretic regimen on 6/18  Obesity: Estimated body mass index is 32.07 kg/m as calculated from the following:   Height as of this encounter: 5\' 1"  (1.549 m).   Weight as of this encounter: 77 kg.   Code status:   Code Status: DNR   DVT Prophylaxis: SCDs Start: 06/23/22 2055   Family Communication: None at bedside   Disposition Plan: Status is: Inpatient Remains inpatient appropriate because: Severity of illness   Planned Discharge Destination:Home   Diet: Diet Order             Diet Heart Room service appropriate? Yes; Fluid consistency: Thin; Fluid restriction: 1800 mL Fluid  Diet effective now                     Antimicrobial agents: Anti-infectives (From admission, onward)    Start     Dose/Rate Route Frequency  Ordered Stop   06/23/22 2200  meropenem (MERREM) 2 g in sodium chloride 0.9 % 100 mL IVPB  Status:  Discontinued        2 g 280 mL/hr over 30 Minutes Intravenous Every 8 hours 06/23/22 1931 06/23/22 1932   06/23/22 2200  meropenem (MERREM) 1 g in sodium chloride 0.9 % 100 mL IVPB         1 g 200 mL/hr over 30 Minutes Intravenous Every 8 hours 06/23/22 2059 07/01/22 2359   06/23/22 1945  ertapenem (INVANZ) 1,000 mg in sodium chloride 0.9 % 100 mL IVPB  Status:  Discontinued        1 g 200 mL/hr over 30 Minutes Intravenous  Once 06/23/22 1932 06/23/22 2059   06/23/22 1915  cefTRIAXone (ROCEPHIN) 2 g in sodium chloride 0.9 % 100 mL IVPB  Status:  Discontinued        2 g 200 mL/hr over 30 Minutes Intravenous  Once 06/23/22 1906 06/23/22 1907   06/23/22 1915  ertapenem (INVANZ) 1,000 mg in sodium chloride 0.9 % 100 mL IVPB  Status:  Discontinued        1 g 200 mL/hr over 30 Minutes Intravenous  Once 06/23/22 1908 06/23/22 1931        MEDICATIONS: Scheduled Meds:  ferrous sulfate  325 mg Oral Q breakfast   folic acid  1 mg Oral Daily   furosemide  20 mg Oral Daily   midodrine  10 mg Oral TID WC   multivitamin with minerals  1 tablet Oral Daily   pantoprazole  40 mg Oral Q0600   propranolol  10 mg Oral BID   sodium bicarbonate  650 mg Oral BID   spironolactone  50 mg Oral Daily   thiamine  100 mg Oral Daily   Continuous Infusions:  albumin human 25 g (06/29/22 0816)   meropenem (MERREM) IV 1 g (06/29/22 0529)   PRN Meds:.acetaminophen, melatonin, ondansetron **OR** ondansetron (ZOFRAN) IV   I have personally reviewed following labs and imaging studies  LABORATORY DATA: CBC: Recent Labs  Lab 06/23/22 1053 06/23/22 1152 06/24/22 0335 06/25/22 0250 06/26/22 0319 06/27/22 0314 06/28/22 0332 06/29/22 0325  WBC 2.1*  --  5.7 5.4 3.1* 2.1* 2.3* 1.6*  NEUTROABS 1.0*  --  4.8  --   --   --   --   --   HGB 10.5*   < > 9.8* 8.0* 7.4* 8.4* 8.7* 8.6*  HCT 31.8*   < > 28.4* 23.2* 21.0* 23.7* 24.7* 24.3*  MCV 107.8*  --  106.0* 102.7* 101.4* 101.7* 98.4 99.2  PLT 35*  --  37* 28* 25* 24* 21* 22*   < > = values in this interval not displayed.     Basic Metabolic Panel: Recent Labs  Lab 06/23/22 1053 06/23/22 1152 06/23/22 2252 06/24/22 0335 06/25/22 0250  06/26/22 0319 06/27/22 0314 06/28/22 0332 06/29/22 0325  NA 137   < >  --  131* 130* 127* 127* 129* 132*  K 3.3*   < >  --  4.2 3.1* 3.1* 3.3* 3.8 3.8  CL 108  --   --  103 103 102 102 102 106  CO2 15*  --   --  22 20* 18* 18* 18* 19*  GLUCOSE 105*  --   --  115* 98 85 105* 87 110*  BUN <5*  --   --  8 20 26* 18 12 11   CREATININE 0.67  --   --  0.97 1.50*  1.19 0.80 0.73 0.69  CALCIUM 7.7*  --   --  7.4* 7.4* 7.3* 7.5* 8.0* 8.4*  MG 1.6*  --   --  1.9  --   --   --   --   --   PHOS  --   --  3.5 3.3  --   --   --   --   --    < > = values in this interval not displayed.     GFR: Estimated Creatinine Clearance: 103.7 mL/min (by C-G formula based on SCr of 0.69 mg/dL).  Liver Function Tests: Recent Labs  Lab 06/25/22 0250 06/26/22 0319 06/27/22 0314 06/28/22 0332 06/29/22 0325  AST 22 20 25 27 28   ALT 11 11 11 12 12   ALKPHOS 47 44 39 44 56  BILITOT 5.1* 5.1* 4.6* 3.8* 3.6*  PROT 4.6* 4.7* 5.0* 5.5* 5.3*  ALBUMIN 2.5* 3.1* 3.2* 3.8 4.0    Recent Labs  Lab 06/23/22 1053  LIPASE 32    Recent Labs  Lab 06/23/22 1053  AMMONIA 42*     Coagulation Profile: Recent Labs  Lab 06/23/22 2252 06/24/22 0335  INR 2.8* 2.8*     Cardiac Enzymes: No results for input(s): "CKTOTAL", "CKMB", "CKMBINDEX", "TROPONINI" in the last 168 hours.  BNP (last 3 results) No results for input(s): "PROBNP" in the last 8760 hours.  Lipid Profile: No results for input(s): "CHOL", "HDL", "LDLCALC", "TRIG", "CHOLHDL", "LDLDIRECT" in the last 72 hours.  Thyroid Function Tests: No results for input(s): "TSH", "T4TOTAL", "FREET4", "T3FREE", "THYROIDAB" in the last 72 hours.  Anemia Panel: No results for input(s): "VITAMINB12", "FOLATE", "FERRITIN", "TIBC", "IRON", "RETICCTPCT" in the last 72 hours.  Urine analysis:    Component Value Date/Time   COLORURINE AMBER (A) 05/31/2022 0445   APPEARANCEUR HAZY (A) 05/31/2022 0445   LABSPEC 1.013 05/31/2022 0445   PHURINE 5.0 05/31/2022  0445   GLUCOSEU NEGATIVE 05/31/2022 0445   HGBUR LARGE (A) 05/31/2022 0445   BILIRUBINUR SMALL (A) 05/31/2022 0445   KETONESUR NEGATIVE 05/31/2022 0445   PROTEINUR 30 (A) 05/31/2022 0445   UROBILINOGEN 0.2 08/02/2014 2045   NITRITE NEGATIVE 05/31/2022 0445   LEUKOCYTESUR NEGATIVE 05/31/2022 0445    Sepsis Labs: Lactic Acid, Venous    Component Value Date/Time   LATICACIDVEN 2.0 (HH) 05/31/2022 0141    MICROBIOLOGY: Recent Results (from the past 240 hour(s))  Body fluid culture w Gram Stain     Status: None   Collection Time: 06/23/22  4:50 PM   Specimen: Abdomen; Peritoneal Fluid  Result Value Ref Range Status   Specimen Description ABDOMEN PERITONEAL  Final   Special Requests NONE  Final   Gram Stain   Final    RARE WBC PRESENT,BOTH PMN AND MONONUCLEAR NO ORGANISMS SEEN    Culture   Final    NO GROWTH 3 DAYS Performed at [facility], [address] [place], [place] [address]    Report Status 06/26/2022 FINAL  Final  Culture, blood (Routine X 2) w Reflex to ID Panel     Status: None (Preliminary result)   Collection Time: 06/27/22  9:18 AM   Specimen: BLOOD  Result Value Ref Range Status   Specimen Description BLOOD RIGHT ANTECUBITAL  Final   Special Requests   Final    BOTTLES DRAWN AEROBIC AND ANAEROBIC Blood Culture adequate volume   Culture   Final    NO GROWTH 2 DAYS Performed at [facility], [address], [place],  [place] [address]    Report Status PENDING  Incomplete  Culture, blood (Routine X 2) w Reflex to ID Panel     Status: None (Preliminary result)   Collection Time: 06/27/22  9:18 AM   Specimen: BLOOD RIGHT HAND  Result Value Ref Range Status   Specimen Description BLOOD RIGHT HAND  Final   Special Requests   Final    BOTTLES DRAWN AEROBIC AND ANAEROBIC Blood Culture adequate volume   Culture   Final    NO GROWTH 2 DAYS Performed at [facility], [address] [place], [place] [address]    Report Status PENDING   Incomplete    RADIOLOGY STUDIES/RESULTS: No results found.   LOS: 6 days   Jeoffrey Massed, MD  [facility]    To contact the attending provider between 7A-7P or the covering provider during after hours 7P-7A, please log into the web site [email or web address] and access using universal [place] password for that web site. If you do not have the password, please call the hospital operator.  06/29/2022, 10:22 AM

## 2022-06-30 LAB — COMPREHENSIVE METABOLIC PANEL
ALT: 12 U/L (ref 0–44)
AST: 27 U/L (ref 15–41)
Albumin: 4.4 g/dL (ref 3.5–5.0)
Alkaline Phosphatase: 46 U/L (ref 38–126)
Anion gap: 9 (ref 5–15)
BUN: 9 mg/dL (ref 6–20)
CO2: 19 mmol/L — ABNORMAL LOW (ref 22–32)
Calcium: 8.7 mg/dL — ABNORMAL LOW (ref 8.9–10.3)
Chloride: 105 mmol/L (ref 98–111)
Creatinine, Ser: 0.61 mg/dL (ref 0.61–1.24)
GFR, Estimated: >60 mL/min (ref >60)
Glucose, Bld: 88 mg/dL (ref 70–99)
Potassium: 3.8 mmol/L (ref 3.5–5.1)
Sodium: 133 mmol/L — ABNORMAL LOW (ref 135–145)
Total Bilirubin: 3.9 mg/dL — ABNORMAL HIGH (ref 0.3–1.2)
Total Protein: 5.7 g/dL — ABNORMAL LOW (ref 6.5–8.1)

## 2022-06-30 LAB — CBC
HCT: 24.3 % — ABNORMAL LOW (ref 39.0–52.0)
MCV: 103 fL — ABNORMAL HIGH (ref 80.0–100.0)
Platelets: 21 10*3/uL — CL (ref 150–400)
nRBC: 0 % (ref 0.0–0.2)

## 2022-06-30 LAB — CULTURE, BLOOD (ROUTINE X 2): Culture: NO GROWTH

## 2022-06-30 NOTE — Progress Notes (Signed)
Handoff completed with Kayla B. At this time. Care turned over at this time.

## 2022-06-30 NOTE — Progress Notes (Signed)
PROGRESS NOTE        PATIENT DETAILS Name: Mathew [name] Age: 42 y.o. Sex: male Date of Birth: [birth year] Admit Date: 06/23/2022 Admitting Physician Gery Pray, MD NGE:[id number], Charlcie Cradle, MD  Brief Summary: Patient is a 42 y.o.  male with history of alcoholic liver cirrhosis-recent history of spontaneous bacterial peritonitis due to ESBL E. coli-presented to the ED with abdominal pain, nausea, vomiting-paracentesis was consistent with SBP-patient was admitted to the hospitalist service.  Significant events: 5/20-5/27>> hospitalization for SBP-E. coli ESBL. 6/13>> admit to [facility]-abdominal pain with nausea/vomiting-paracentesis consistent with SBP.  Significant studies: 6/13>> CT abdomen/pelvis: Changes consistent with chronic liver disease-significant ascites.  Significant microbiology data: 5/21>> ascites fluid culture: ESBL E. coli 6/13>> ascites fluid culture: No growth (WBC-9210-85% neutrophils)  Procedures: 6/13>> paracentesis.  Consults: ID  Subjective: No acute events overnight.  Lying comfortably in bed.  Objective: Vitals: Blood pressure 109/64, pulse 70, temperature 98 F (36.7 C), temperature source Oral, resp. rate 16, height 5\' 1"  (1.549 m), weight 77 kg, SpO2 98 %.   Exam: Gen Exam:Alert awake-not in any distress HEENT:atraumatic, normocephalic Chest: B/L clear to auscultation anteriorly CVS:S1S2 regular Abdomen:soft non tender, non distended Extremities:+ edema Neurology: Non focal Skin: no rash  Pertinent Labs/Radiology:    Latest Ref Rng & Units 06/30/2022    4:12 AM 06/29/2022    3:25 AM 06/28/2022    3:32 AM  CBC  WBC 4.0 - 10.5 K/uL 1.4  1.6  2.3   Hemoglobin 13.0 - 17.0 g/dL 8.4  8.6  8.7   Hematocrit 39.0 - 52.0 % 24.3  24.3  24.7   Platelets 150 - 400 K/uL 21  22  21      Lab Results  Component Value Date   NA 133 (L) 06/30/2022   K 3.8 06/30/2022   CL 105 06/30/2022   CO2 19 (L) 06/30/2022       Assessment/Plan: Spontaneous bacterial peritonitis No further fever for 48 hours Discussed with ID-meropenem until 6/21-afterwards transition to Bactrim for prophylaxis.  AKI Likely hemodynamically mediated Has stabilized-stop albumin Continue midodrine.  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  Will discuss with infectious disease.  EtOH use Has been counseled numerous times in the past-unfortunately even with advanced liver cirrhosis-continues to drain.  Hypokalemia Likely due to EtOH use Repleted  Hyponatremia Mild Secondary to volume overload/cirrhosis-improving resumption of diuretic regimen on 6/18  Obesity: Estimated body mass index is 32.07 kg/m as calculated from the following:   Height as of this encounter: 5\' 1"  (1.549 m).   Weight as of this encounter: 77 kg.   Code status:   Code Status: DNR   DVT Prophylaxis: SCDs Start: 06/23/22 2055   Family Communication: None at bedside   Disposition Plan: Status is: Inpatient Remains inpatient appropriate because: Severity of illness   Planned Discharge Destination:Home   Diet: Diet Order             Diet Heart Room service appropriate? Yes; Fluid consistency: Thin; Fluid restriction: 1800 mL Fluid  Diet effective now  Antimicrobial agents: Anti-infectives (From admission, onward)    Start     Dose/Rate Route Frequency Ordered Stop   06/29/22 0000  sulfamethoxazole-trimethoprim (BACTRIM DS) 800-160 MG tablet        1 tablet Oral Daily 06/29/22 1054      06/23/22 2200  meropenem (MERREM) 2 g in sodium chloride 0.9 % 100 mL IVPB  Status:  Discontinued        2 g 280 mL/hr over 30 Minutes Intravenous Every 8 hours 06/23/22 1931 06/23/22 1932   06/23/22 2200  meropenem (MERREM) 1 g in sodium chloride 0.9 % 100 mL IVPB        1 g 200 mL/hr over 30 Minutes Intravenous Every 8 hours 06/23/22 2059 07/01/22 2359   06/23/22 1945  ertapenem (INVANZ) 1,000 mg in sodium chloride 0.9 % 100 mL IVPB  Status:  Discontinued        1 g 200 mL/hr over 30 Minutes Intravenous  Once 06/23/22 1932 06/23/22 2059   06/23/22 1915  cefTRIAXone (ROCEPHIN) 2 g in sodium chloride 0.9 % 100 mL IVPB  Status:  Discontinued        2 g 200 mL/hr over 30 Minutes Intravenous  Once 06/23/22 1906 06/23/22 1907   06/23/22 1915  ertapenem (INVANZ) 1,000 mg in sodium chloride 0.9 % 100 mL IVPB  Status:  Discontinued        1 g 200 mL/hr over 30 Minutes Intravenous  Once 06/23/22 1908 06/23/22 1931        MEDICATIONS: Scheduled Meds:  ferrous sulfate  325 mg Oral Q breakfast   folic acid  1 mg Oral Daily   furosemide  20 mg Oral Daily   midodrine  10 mg Oral TID WC   multivitamin with minerals  1 tablet Oral Daily   pantoprazole  40 mg Oral Q0600   propranolol  10 mg Oral BID   sodium bicarbonate  650 mg Oral BID   spironolactone  50 mg Oral Daily   thiamine  100 mg Oral Daily   Continuous Infusions:  albumin human 25 g (06/30/22 0844)   meropenem (MERREM) IV 1 g (06/30/22 0507)   PRN Meds:.acetaminophen, melatonin, ondansetron **OR** ondansetron (ZOFRAN) IV   I have personally reviewed following labs and imaging studies  LABORATORY DATA: CBC: Recent Labs  Lab 06/23/22 1053 06/23/22 1152 06/24/22 0335 06/25/22 0250 06/26/22 0319 06/27/22 0314 06/28/22 0332 06/29/22 0325 06/30/22 0412  WBC 2.1*  --  5.7   < > 3.1* 2.1* 2.3* 1.6* 1.4*  NEUTROABS 1.0*  --  4.8  --   --   --   --   --   --   HGB 10.5*   < > 9.8*   < > 7.4* 8.4* 8.7* 8.6* 8.4*  HCT 31.8*    < > 28.4*   < > 21.0* 23.7* 24.7* 24.3* 24.3*  MCV 107.8*  --  106.0*   < > 101.4* 101.7* 98.4 99.2 103.0*  PLT 35*  --  37*   < > 25* 24* 21* 22* 21*   < > = values in this interval not displayed.     Basic Metabolic Panel: Recent Labs  Lab 06/23/22 1053 06/23/22 1152 06/23/22 2252 06/24/22 0335 06/25/22 0250 06/26/22 0319 06/27/22 0314 06/28/22 0332 06/29/22 0325 06/30/22 0412  NA 137   < >  --  131*   < > 127* 127* 129* 132* 133*  K 3.3*   < >  --  4.2   < >  3.1* 3.3* 3.8 3.8 3.8  CL 108  --   --  103   < > 102 102 102 106 105  CO2 15*  --   --  22   < > 18* 18* 18* 19* 19*  GLUCOSE 105*  --   --  115*   < > 85 105* 87 110* 88  BUN <5*  --   --  8   < > 26* 18 12 11 9   CREATININE 0.67  --   --  0.97   < > 1.19 0.80 0.73 0.69 0.61  CALCIUM 7.7*  --   --  7.4*   < > 7.3* 7.5* 8.0* 8.4* 8.7*  MG 1.6*  --   --  1.9  --   --   --   --   --   --   PHOS  --   --  3.5 3.3  --   --   --   --   --   --    < > = values in this interval not displayed.     GFR: Estimated Creatinine Clearance: 103.7 mL/min (by C-G formula based on SCr of 0.61 mg/dL).  Liver Function Tests: Recent Labs  Lab 06/26/22 0319 06/27/22 0314 06/28/22 0332 06/29/22 0325 06/30/22 0412  AST 20 25 27 28 27   ALT 11 11 12 12 12   ALKPHOS 44 39 44 56 46  BILITOT 5.1* 4.6* 3.8* 3.6* 3.9*  PROT 4.7* 5.0* 5.5* 5.3* 5.7*  ALBUMIN 3.1* 3.2* 3.8 4.0 4.4    Recent Labs  Lab 06/23/22 1053  LIPASE 32    Recent Labs  Lab 06/23/22 1053  AMMONIA 42*     Coagulation Profile: Recent Labs  Lab 06/23/22 2252 06/24/22 0335  INR 2.8* 2.8*     Cardiac Enzymes: No results for input(s): "CKTOTAL", "CKMB", "CKMBINDEX", "TROPONINI" in the last 168 hours.  BNP (last 3 results) No results for input(s): "PROBNP" in the last 8760 hours.  Lipid Profile: No results for input(s): "CHOL", "HDL", "LDLCALC", "TRIG", "CHOLHDL", "LDLDIRECT" in the last 72 hours.  Thyroid Function Tests: No results for  input(s): "TSH", "T4TOTAL", "FREET4", "T3FREE", "THYROIDAB" in the last 72 hours.  Anemia Panel: No results for input(s): "VITAMINB12", "FOLATE", "FERRITIN", "TIBC", "IRON", "RETICCTPCT" in the last 72 hours.  Urine analysis:    Component Value Date/Time   COLORURINE AMBER (A) 05/31/2022 0445   APPEARANCEUR HAZY (A) 05/31/2022 0445   LABSPEC 1.013 05/31/2022 0445   PHURINE 5.0 05/31/2022 0445   GLUCOSEU NEGATIVE 05/31/2022 0445   HGBUR LARGE (A) 05/31/2022 0445   BILIRUBINUR SMALL (A) 05/31/2022 0445   KETONESUR NEGATIVE 05/31/2022 0445   PROTEINUR 30 (A) 05/31/2022 0445   UROBILINOGEN 0.2 08/02/2014 2045   NITRITE NEGATIVE 05/31/2022 0445   LEUKOCYTESUR NEGATIVE 05/31/2022 0445    Sepsis Labs: Lactic Acid, Venous    Component Value Date/Time   LATICACIDVEN 2.0 (HH) 05/31/2022 0141    MICROBIOLOGY: Recent Results (from the past 240 hour(s))  Body fluid culture w Gram Stain     Status: None   Collection Time: 06/23/22  4:50 PM   Specimen: Abdomen; Peritoneal Fluid  Result Value Ref Range Status   Specimen Description ABDOMEN PERITONEAL  Final   Special Requests NONE  Final   Gram Stain   Final    RARE WBC PRESENT,BOTH PMN AND MONONUCLEAR NO ORGANISMS SEEN    Culture   Final    NO GROWTH 3 DAYS Performed at [facility], [address] [place], [place] [address]    Report Status 06/26/2022 FINAL  Final  Culture, blood (Routine X 2) w Reflex to ID Panel     Status: None (Preliminary result)   Collection Time: 06/27/22  9:18 AM   Specimen: BLOOD  Result Value Ref Range Status   Specimen Description BLOOD RIGHT ANTECUBITAL  Final   Special Requests   Final    BOTTLES DRAWN AEROBIC AND ANAEROBIC Blood Culture adequate volume   Culture   Final    NO GROWTH 3 DAYS Performed at [facility], [address], [place], [place] [address]    Report Status PENDING  Incomplete  Culture, blood (Routine X 2) w Reflex to ID Panel     Status: None (Preliminary  result)   Collection Time: 06/27/22  9:18 AM   Specimen: BLOOD RIGHT HAND  Result Value Ref Range Status   Specimen Description BLOOD RIGHT HAND  Final   Special Requests   Final    BOTTLES DRAWN AEROBIC AND ANAEROBIC Blood Culture adequate volume   Culture   Final    NO GROWTH 3 DAYS Performed at [facility], [address] [place], [place] [address]    Report Status PENDING  Incomplete    RADIOLOGY STUDIES/RESULTS: No results found.   LOS: 7 days   Jeoffrey Massed, MD  [facility]    To contact the attending provider between 7A-7P or the covering provider during after hours 7P-7A, please log into the web site [email or web address] and access using universal [place] password for that web site. If you do not have the password, please call the hospital operator.  06/30/2022, 9:51 AM

## 2022-07-01 ENCOUNTER — Inpatient Hospital Stay (HOSPITAL_COMMUNITY): Payer: Medicaid Other

## 2022-07-01 ENCOUNTER — Other Ambulatory Visit (HOSPITAL_COMMUNITY): Payer: Self-pay

## 2022-07-01 HISTORY — PX: IR PARACENTESIS: IMG2679

## 2022-07-01 LAB — GRAM STAIN

## 2022-07-01 LAB — CBC WITH DIFFERENTIAL/PLATELET
Abs Immature Granulocytes: 0 10*3/uL (ref 0.00–0.07)
Basophils Absolute: 0 10*3/uL (ref 0.0–0.1)
Basophils Relative: 1 %
Eosinophils Absolute: 0 10*3/uL (ref 0.0–0.5)
Eosinophils Relative: 1 %
HCT: 24.9 % — ABNORMAL LOW (ref 39.0–52.0)
Hemoglobin: 8.6 g/dL — ABNORMAL LOW (ref 13.0–17.0)
Immature Granulocytes: 0 %
Lymphocytes Relative: 34 %
Lymphs Abs: 0.5 10*3/uL — ABNORMAL LOW (ref 0.7–4.0)
MCH: 35.2 pg — ABNORMAL HIGH (ref 26.0–34.0)
MCHC: 34.5 g/dL (ref 30.0–36.0)
MCV: 102 fL — ABNORMAL HIGH (ref 80.0–100.0)
Monocytes Absolute: 0.3 10*3/uL (ref 0.1–1.0)
Monocytes Relative: 21 %
Neutro Abs: 0.6 10*3/uL — ABNORMAL LOW (ref 1.7–7.7)
Neutrophils Relative %: 43 %
Platelets: 29 10*3/uL — CL (ref 150–400)
RBC: 2.44 MIL/uL — ABNORMAL LOW (ref 4.22–5.81)
RDW: 14.9 % (ref 11.5–15.5)
WBC: 1.5 10*3/uL — ABNORMAL LOW (ref 4.0–10.5)
nRBC: 0 % (ref 0.0–0.2)

## 2022-07-01 LAB — CBC
Hemoglobin: 8.4 g/dL — ABNORMAL LOW (ref 13.0–17.0)
MCH: 35.6 pg — ABNORMAL HIGH (ref 26.0–34.0)
MCHC: 34.6 g/dL (ref 30.0–36.0)
RBC: 2.36 MIL/uL — ABNORMAL LOW (ref 4.22–5.81)
RDW: 14.9 % (ref 11.5–15.5)
WBC: 1.4 10*3/uL — CL (ref 4.0–10.5)

## 2022-07-01 LAB — BODY FLUID CELL COUNT WITH DIFFERENTIAL
Eos, Fluid: 0 %
Lymphs, Fluid: 93 %
Monocyte-Macrophage-Serous Fluid: 5 % — ABNORMAL LOW (ref 50–90)
Neutrophil Count, Fluid: 2 % (ref 0–25)
Total Nucleated Cell Count, Fluid: 215 cu mm (ref 0–1000)

## 2022-07-01 LAB — CULTURE, BLOOD (ROUTINE X 2)

## 2022-07-01 MED ORDER — ALBUMIN HUMAN 25 % IV SOLN
50.0000 g | Freq: Once | INTRAVENOUS | Status: AC
  Administered 2022-07-01: 50 g via INTRAVENOUS
  Filled 2022-07-01: qty 200

## 2022-07-01 MED ORDER — LIDOCAINE HCL 1 % IJ SOLN
INTRAMUSCULAR | Status: AC
  Filled 2022-07-01: qty 20

## 2022-07-01 MED ORDER — LIDOCAINE HCL 1 % IJ SOLN
10.0000 mL | Freq: Once | INTRAMUSCULAR | Status: AC
  Administered 2022-07-01: 10 mL via INTRADERMAL
  Filled 2022-07-01: qty 10

## 2022-07-01 NOTE — Procedures (Signed)
PROCEDURE SUMMARY:  Successful ultrasound guided paracentesis from the right lower quadrant.  Yielded 4 L of orange/yellow fluid.  No immediate complications.  The patient tolerated the procedure well.   Specimen sent for labs.  EBL < 2 mL  The patient has previously been formally evaluated by the [facility] and is being actively followed for potential future intervention. Please see note written by Dr. Elby Showers 06/06/22  Alwyn Ren, AGACNP-BC [phone] 07/01/2022, 10:56 AM

## 2022-07-01 NOTE — Progress Notes (Signed)
Patient off unit at this time for paracentesis.

## 2022-07-01 NOTE — Progress Notes (Signed)
PROGRESS NOTE        PATIENT DETAILS Name: Mathew [name] Age: 42 y.o. Sex: male Date of Birth: 06-30-1980 Admit Date: 06/23/2022 Admitting Physician Gery Pray, MD ZOX:[id number], Charlcie Cradle, MD  Brief Summary: Patient is a 42 y.o.  male with history of alcoholic liver cirrhosis-recent history of spontaneous bacterial peritonitis due to ESBL E. coli-presented to the ED with abdominal pain, nausea, vomiting-paracentesis was consistent with SBP-patient was admitted to the hospitalist service.  Significant events: 5/20-5/27>> hospitalization for SBP-E. coli ESBL. 6/13>> admit to [facility]-abdominal pain with nausea/vomiting-paracentesis consistent with SBP.  Significant studies: 6/13>> CT abdomen/pelvis: Changes consistent with chronic liver disease-significant ascites.  Significant microbiology data: 5/21>> ascites fluid culture: ESBL E. coli 6/13>> ascites fluid culture: No growth (WBC-9210-85% neutrophils)  Procedures: 6/13>> paracentesis.  Consults: ID  Subjective: Complains of pain in his abdomen-feels more swelling in his abdomen today.  Objective: Vitals: Blood pressure 109/69, pulse 70, temperature 99.2 F (37.3 C), resp. rate 14, height 5\' 1"  (1.549 m), weight 77 kg, SpO2 97 %.   Exam: Gen Exam:Alert awake-not in any distress HEENT:atraumatic, normocephalic Chest: B/L clear to auscultation anteriorly CVS:S1S2 regular Abdomen: Soft-slightly more distended than the past few days.  Shifting dullness in the flanks. Extremities:no edema Neurology: Non focal Skin: no rash  Pertinent Labs/Radiology:    Latest Ref Rng & Units 07/01/2022    8:55 AM 06/30/2022    4:12 AM 06/29/2022    3:25 AM  CBC  WBC 4.0 - 10.5 K/uL 1.5  1.4  1.6   Hemoglobin 13.0 - 17.0 g/dL 8.6  8.4  8.6   Hematocrit 39.0 - 52.0 % 24.9  24.3  24.3   Platelets 150 - 400 K/uL 29  21  22      Lab Results  Component Value Date   NA 133 (L) 06/30/2022   K 3.8  06/30/2022   CL 105 06/30/2022   CO2 19 (L) 06/30/2022      Assessment/Plan: Spontaneous bacterial peritonitis Overall better-however more ascites today-repeating paracentesis today.  See below. Discussed with ID-meropenem until 6/21-afterwards transition to Bactrim for prophylaxis.  AKI Likely hemodynamically mediated Has stabilized-stop albumin Continue midodrine.  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 Continue diuretic regimen Leukopenia felt to be secondary to underlying liver cirrhosis/hypersplenism.  Platelet count low but stable for the past several days.  EtOH use Has been counseled numerous times in the past-unfortunately even with advanced liver cirrhosis-continues to drain.  Hypokalemia Likely due to EtOH use Repleted  Hyponatremia Mild Secondary to volume overload/cirrhosis-improving resumption of diuretic regimen on 6/18  Obesity: Estimated body mass index is 32.07 kg/m as calculated from the following:   Height as of this encounter: 5\' 1"  (1.549 m).   Weight as of this encounter: 77 kg.   Code status:   Code Status: DNR   DVT Prophylaxis: SCDs Start: 06/23/22 2055   Family Communication: None at bedside   Disposition Plan: Status is: Inpatient Remains inpatient appropriate because: Severity of illness   Planned Discharge Destination:Home   Diet: Diet Order             Diet Heart Room service appropriate? Yes; Fluid consistency: Thin; Fluid restriction: 1800 mL Fluid  Diet effective  now                     Antimicrobial agents: Anti-infectives (From admission, onward)    Start     Dose/Rate Route Frequency Ordered Stop   06/29/22 0000  sulfamethoxazole-trimethoprim (BACTRIM  DS) 800-160 MG tablet        1 tablet Oral Daily 06/29/22 1054     06/23/22 2200  meropenem (MERREM) 2 g in sodium chloride 0.9 % 100 mL IVPB  Status:  Discontinued        2 g 280 mL/hr over 30 Minutes Intravenous Every 8 hours 06/23/22 1931 06/23/22 1932   06/23/22 2200  meropenem (MERREM) 1 g in sodium chloride 0.9 % 100 mL IVPB        1 g 200 mL/hr over 30 Minutes Intravenous Every 8 hours 06/23/22 2059 07/01/22 2359   06/23/22 1945  ertapenem (INVANZ) 1,000 mg in sodium chloride 0.9 % 100 mL IVPB  Status:  Discontinued        1 g 200 mL/hr over 30 Minutes Intravenous  Once 06/23/22 1932 06/23/22 2059   06/23/22 1915  cefTRIAXone (ROCEPHIN) 2 g in sodium chloride 0.9 % 100 mL IVPB  Status:  Discontinued        2 g 200 mL/hr over 30 Minutes Intravenous  Once 06/23/22 1906 06/23/22 1907   06/23/22 1915  ertapenem (INVANZ) 1,000 mg in sodium chloride 0.9 % 100 mL IVPB  Status:  Discontinued        1 g 200 mL/hr over 30 Minutes Intravenous  Once 06/23/22 1908 06/23/22 1931        MEDICATIONS: Scheduled Meds:  ferrous sulfate  325 mg Oral Q breakfast   folic acid  1 mg Oral Daily   furosemide  20 mg Oral Daily   midodrine  10 mg Oral TID WC   multivitamin with minerals  1 tablet Oral Daily   pantoprazole  40 mg Oral Q0600   propranolol  10 mg Oral BID   sodium bicarbonate  650 mg Oral BID   spironolactone  50 mg Oral Daily   thiamine  100 mg Oral Daily   Continuous Infusions:  meropenem (MERREM) IV 1 g (07/01/22 0555)   PRN Meds:.acetaminophen, melatonin, ondansetron **OR** ondansetron (ZOFRAN) IV   I have personally reviewed following labs and imaging studies  LABORATORY DATA: CBC: Recent Labs  Lab 06/27/22 0314 06/28/22 0332 06/29/22 0325 06/30/22 0412 07/01/22 0855  WBC 2.1* 2.3* 1.6* 1.4* 1.5*  NEUTROABS  --   --   --   --  0.6*  HGB 8.4* 8.7* 8.6* 8.4* 8.6*  HCT 23.7* 24.7* 24.3* 24.3* 24.9*  MCV 101.7* 98.4 99.2 103.0* 102.0*  PLT 24* 21* 22* 21* 29*      Basic Metabolic Panel: Recent Labs  Lab 06/26/22 0319 06/27/22 0314 06/28/22 0332 06/29/22 0325 06/30/22 0412  NA 127* 127* 129* 132* 133*  K 3.1* 3.3* 3.8 3.8 3.8  CL 102 102 102 106 105  CO2 18* 18* 18* 19* 19*  GLUCOSE 85 105* 87 110* 88  BUN 26* 18 12 11 9   CREATININE 1.19 0.80 0.73 0.69 0.61  CALCIUM 7.3* 7.5* 8.0* 8.4* 8.7*     GFR: Estimated Creatinine Clearance: 103.7 mL/min (by C-G formula based on SCr of 0.61 mg/dL).  Liver Function Tests: Recent Labs  Lab 06/26/22 0319 06/27/22 0314 06/28/22 0332 06/29/22 0325 06/30/22 0412  AST 20 25 27 28 27   ALT 11 11 12  12  12  ALKPHOS 44 39 44 56 46  BILITOT 5.1* 4.6* 3.8* 3.6* 3.9*  PROT 4.7* 5.0* 5.5* 5.3* 5.7*  ALBUMIN 3.1* 3.2* 3.8 4.0 4.4    No results for input(s): "LIPASE", "AMYLASE" in the last 168 hours.  No results for input(s): "AMMONIA" in the last 168 hours.   Coagulation Profile: No results for input(s): "INR", "PROTIME" in the last 168 hours.   Cardiac Enzymes: No results for input(s): "CKTOTAL", "CKMB", "CKMBINDEX", "TROPONINI" in the last 168 hours.  BNP (last 3 results) No results for input(s): "PROBNP" in the last 8760 hours.  Lipid Profile: No results for input(s): "CHOL", "HDL", "LDLCALC", "TRIG", "CHOLHDL", "LDLDIRECT" in the last 72 hours.  Thyroid Function Tests: No results for input(s): "TSH", "T4TOTAL", "FREET4", "T3FREE", "THYROIDAB" in the last 72 hours.  Anemia Panel: No results for input(s): "VITAMINB12", "FOLATE", "FERRITIN", "TIBC", "IRON", "RETICCTPCT" in the last 72 hours.  Urine analysis:    Component Value Date/Time   COLORURINE AMBER (A) 05/31/2022 0445   APPEARANCEUR HAZY (A) 05/31/2022 0445   LABSPEC 1.013 05/31/2022 0445   PHURINE 5.0 05/31/2022 0445   GLUCOSEU NEGATIVE 05/31/2022 0445   HGBUR LARGE (A) 05/31/2022 0445   BILIRUBINUR SMALL (A) 05/31/2022 0445   KETONESUR NEGATIVE 05/31/2022 0445   PROTEINUR 30 (A) 05/31/2022 0445   UROBILINOGEN 0.2  08/02/2014 2045   NITRITE NEGATIVE 05/31/2022 0445   LEUKOCYTESUR NEGATIVE 05/31/2022 0445    Sepsis Labs: Lactic Acid, Venous    Component Value Date/Time   LATICACIDVEN 2.0 (HH) 05/31/2022 0141    MICROBIOLOGY: Recent Results (from the past 240 hour(s))  Body fluid culture w Gram Stain     Status: None   Collection Time: 06/23/22  4:50 PM   Specimen: Abdomen; Peritoneal Fluid  Result Value Ref Range Status   Specimen Description ABDOMEN PERITONEAL  Final   Special Requests NONE  Final   Gram Stain   Final    RARE WBC PRESENT,BOTH PMN AND MONONUCLEAR NO ORGANISMS SEEN    Culture   Final    NO GROWTH 3 DAYS Performed at [facility], [address], [place], [place] [address]    Report Status 06/26/2022 FINAL  Final  Culture, blood (Routine X 2) w Reflex to ID Panel     Status: None (Preliminary result)   Collection Time: 06/27/22  9:18 AM   Specimen: BLOOD  Result Value Ref Range Status   Specimen Description BLOOD RIGHT ANTECUBITAL  Final   Special Requests   Final    BOTTLES DRAWN AEROBIC AND ANAEROBIC Blood Culture adequate volume   Culture   Final    NO GROWTH 4 DAYS Performed at [facility], [address], [place], [place] [address]    Report Status PENDING  Incomplete  Culture, blood (Routine X 2) w Reflex to ID Panel     Status: None (Preliminary result)   Collection Time: 06/27/22  9:18 AM   Specimen: BLOOD RIGHT HAND  Result Value Ref Range Status   Specimen Description BLOOD RIGHT HAND  Final   Special Requests   Final    BOTTLES DRAWN AEROBIC AND ANAEROBIC Blood Culture adequate volume   Culture   Final    NO GROWTH 4 DAYS Performed at [facility], [address], [place], [place] [address]    Report Status PENDING  Incomplete  Gram stain     Status: None   Collection Time: 07/01/22  9:54 AM   Specimen: Peritoneal Washings  Result Value Ref Range Status   Specimen Description PERITONEAL  Final   Special Requests NONE  Final    Gram Stain   Final    FEW WBC PRESENT, PREDOMINANTLY MONONUCLEAR RARE SQUAMOUS EPITHELIAL CELLS PRESENT NO ORGANISMS SEEN Performed at [facility], [address], [place], [place] [address]    Report Status 07/01/2022 FINAL  Final    RADIOLOGY STUDIES/RESULTS: No results found.   LOS: 8 days   Jeoffrey Massed, MD  [facility]    To contact the attending provider between 7A-7P or the covering provider during after hours 7P-7A, please log into the web site [email or web address] and access using universal [place] password for that web site. If you do not have the password, please call the hospital operator.  07/01/2022, 11:56 AM

## 2022-07-02 ENCOUNTER — Other Ambulatory Visit (HOSPITAL_COMMUNITY): Payer: Self-pay

## 2022-07-02 LAB — CULTURE, BODY FLUID W GRAM STAIN -BOTTLE

## 2022-07-02 LAB — CULTURE, BLOOD (ROUTINE X 2)
Culture: NO GROWTH
Special Requests: ADEQUATE
Special Requests: ADEQUATE

## 2022-07-02 MED ORDER — SODIUM BICARBONATE 650 MG PO TABS
650.0000 mg | ORAL_TABLET | Freq: Two times a day (BID) | ORAL | 1 refills | Status: DC
  Filled 2022-07-02: qty 60, 30d supply, fill #0

## 2022-07-02 MED ORDER — PROPRANOLOL HCL 10 MG PO TABS
10.0000 mg | ORAL_TABLET | Freq: Two times a day (BID) | ORAL | 2 refills | Status: DC
  Filled 2022-07-02: qty 60, 30d supply, fill #0

## 2022-07-02 MED ORDER — FUROSEMIDE 20 MG PO TABS
20.0000 mg | ORAL_TABLET | Freq: Every day | ORAL | 3 refills | Status: DC
  Filled 2022-07-02: qty 30, 30d supply, fill #0

## 2022-07-02 MED ORDER — FOLIC ACID 1 MG PO TABS
1.0000 mg | ORAL_TABLET | Freq: Every day | ORAL | 1 refills | Status: DC
  Filled 2022-07-02: qty 30, 30d supply, fill #0

## 2022-07-02 MED ORDER — SULFAMETHOXAZOLE-TRIMETHOPRIM 800-160 MG PO TABS
1.0000 | ORAL_TABLET | Freq: Every day | ORAL | 2 refills | Status: DC
  Filled 2022-07-02: qty 30, 30d supply, fill #0

## 2022-07-02 MED ORDER — SPIRONOLACTONE 50 MG PO TABS
50.0000 mg | ORAL_TABLET | Freq: Every day | ORAL | 3 refills | Status: DC
  Filled 2022-07-02: qty 30, 30d supply, fill #0

## 2022-07-02 MED ORDER — FERROUS SULFATE 325 (65 FE) MG PO TABS
325.0000 mg | ORAL_TABLET | Freq: Every day | ORAL | 3 refills | Status: DC
  Filled 2022-07-02: qty 30, 30d supply, fill #0

## 2022-07-02 MED ORDER — PANTOPRAZOLE SODIUM 40 MG PO TBEC
40.0000 mg | DELAYED_RELEASE_TABLET | Freq: Every day | ORAL | 1 refills | Status: DC
  Filled 2022-07-02: qty 30, 30d supply, fill #0

## 2022-07-02 MED ORDER — MIDODRINE HCL 10 MG PO TABS
10.0000 mg | ORAL_TABLET | Freq: Three times a day (TID) | ORAL | 3 refills | Status: DC
  Filled 2022-07-02: qty 90, 30d supply, fill #0

## 2022-07-02 MED ORDER — LACTULOSE 10 GM/15ML PO SOLN
30.0000 g | Freq: Four times a day (QID) | ORAL | 1 refills | Status: DC
  Filled 2022-07-02: qty 473, 3d supply, fill #0

## 2022-07-02 NOTE — Progress Notes (Signed)
Discharge paperwork reviewed with patient at the bedside. This RN delivered TOC medications and patients personal belongings and medications stored in pharmacy. IV has been removed.

## 2022-07-02 NOTE — Discharge Summary (Signed)
PATIENT DETAILS Name: Mathew [name] Age: 42 y.o. Sex: male Date of Birth: [birth year] MRN: [id number]. Admitting Physician: Gery Pray, MD [id number], Charlcie Cradle, MD  Admit Date: 06/23/2022 Discharge date: 07/02/2022  Recommendations for Outpatient Follow-up:  Follow up with PCP in 1-2 weeks Please obtain CMP/CBC in one week  Admitted From:  Home  Disposition: Home   Discharge Condition: fair  CODE STATUS:   Code Status: DNR   Diet recommendation:  Diet Order             Diet - low sodium heart healthy           Diet Heart Room service appropriate? Yes; Fluid consistency: Thin; Fluid restriction: 1800 mL Fluid  Diet effective now                    Brief Summary: Patient is a 42 y.o.  male with history of alcoholic liver cirrhosis-recent history of spontaneous bacterial peritonitis due to ESBL E. coli-presented to the ED with abdominal pain, nausea, vomiting-paracentesis was consistent with SBP-patient was admitted to the hospitalist service.   Significant events: 5/20-5/27>> hospitalization for SBP-E. coli ESBL. 6/13>> admit to [facility]-abdominal pain with nausea/vomiting-paracentesis consistent with SBP.   Significant studies: 6/13>> CT abdomen/pelvis: Changes consistent with chronic liver disease-significant ascites.   Significant microbiology data: 5/21>> ascites fluid culture: ESBL E. coli 6/13>> ascites fluid culture: No growth (WBC-9210-85% neutrophils)   Procedures: 6/13>> paracentesis. 6/21>> paracentesis   Consults: ID    Brief Hospital Course: Spontaneous bacterial peritonitis Overall better-completed meropenem-last dose on 6/21 Repeat paracentesis on 6/21-with no evidence of SBP-although cultures are pending. Per discussion with ID-plans are to transition to Bactrim for prophylaxis.    AKI Likely hemodynamically mediated Stabilized with midodrine/IV albumin Creatinine has now normalized.   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  Feels much better on 6/22 and is requesting discharge. Continue diuretic regimen/midodrine on discharge.   Leukopenia felt to be secondary to underlying liver cirrhosis/hypersplenism.  Platelet count low but stable for the past several days. Follow-up with PCP-repeat CBC in 1 week.  His overall prognosis is poor-as he continues to unfortunately drink alcohol.   EtOH use Has been counseled numerous times in the past-unfortunately even with advanced liver cirrhosis-continues to drain.   Hypokalemia Likely due to EtOH use Repleted   Hyponatremia Mild Secondary to volume overload/cirrhosis-improving resumption of diuretic regimen on 6/18   Obesity: Estimated body mass index is 32.07 kg/m as calculated from the following:   Height as of this encounter: 5\' 1"  (1.549 m).   Weight as of this encounter: 77 kg.   Discharge Diagnoses:  Principal Problem:   SBP (spontaneous bacterial peritonitis) (HCC) s/t Decompensated Hepatic Cirrhosis Active Problems:   Hypomagnesemia   Alcoholic cirrhosis of liver with ascites (HCC)   Hypokalemia   Alcohol use disorder   Macrocytic anemia   Pancytopenia (HCC)   Edema due to hypoalbuminemia   Tobacco abuse   Cirrhosis of liver (HCC)   Thrombocytopenia (HCC)   Decompensated hepatic cirrhosis (HCC)   Discharge Instructions:  Activity:  As tolerated    Discharge Instructions     Call MD for:  difficulty breathing, headache or visual disturbances   Complete by: As directed    Call MD for:  redness, tenderness, or signs of infection (pain, swelling, redness, odor or green/yellow discharge around incision site)  Complete by: As directed    Call MD for:  severe uncontrolled pain   Complete by: As directed    Diet - low sodium heart  healthy   Complete by: As directed    Discharge instructions   Complete by: As directed    Follow with Primary MD  [name], MD in 1-2 weeks  Stop drinking  Please get a complete blood count and chemistry panel checked by your Primary MD at your next visit, and again as instructed by your Primary MD.  Get Medicines reviewed and adjusted: Please take all your medications with you for your next visit with your Primary MD  Laboratory/radiological data: Please request your Primary MD to go over all hospital tests and procedure/radiological results at the follow up, please ask your Primary MD to get all Hospital records sent to his/her office.  In some cases, they will be blood work, cultures and biopsy results pending at the time of your discharge. Please request that your primary care M.D. follows up on these results.  Also Note the following: If you experience worsening of your admission symptoms, develop shortness of breath, life threatening emergency, suicidal or homicidal thoughts you must seek medical attention immediately by calling 911 or calling your MD immediately  if symptoms less severe.  You must read complete instructions/literature along with all the possible adverse reactions/side effects for all the Medicines you take and that have been prescribed to you. Take any new Medicines after you have completely understood and accpet all the possible adverse reactions/side effects.   Do not drive when taking Pain medications or sleeping medications (Benzodaizepines)  Do not take more than prescribed Pain, Sleep and Anxiety Medications. It is not advisable to combine anxiety,sleep and pain medications without talking with your primary care practitioner  Special Instructions: If you have smoked or chewed Tobacco  in the last 2 yrs please stop smoking, stop any regular Alcohol  and or any Recreational drug use.  Wear Seat belts while driving.  Please note: You were cared for  by a hospitalist during your hospital stay. Once you are discharged, your primary care physician will handle any further medical issues. Please note that NO REFILLS for any discharge medications will be authorized once you are discharged, as it is imperative that you return to your primary care physician (or establish a relationship with a primary care physician if you do not have one) for your post hospital discharge needs so that they can reassess your need for medications and monitor your lab values.   Increase activity slowly   Complete by: As directed       Allergies as of 07/02/2022   No Known Allergies      Medication List     STOP taking these medications    ciprofloxacin 500 MG tablet Commonly known as: CIPRO       TAKE these medications    ferrous sulfate 325 (65 FE) MG tablet Take 1 tablet (325 mg total) by mouth daily with breakfast.   folic acid 1 MG tablet Commonly known as: FOLVITE Take 1 tablet (1 mg total) by mouth daily.   furosemide 20 MG tablet Commonly known as: LASIX Tome 1 tableta (20 mg en total) por va oral diariamente. (Take 1 tablet (20 mg total) by mouth daily.)   lactulose 10 GM/15ML solution Commonly known as: CHRONULAC Take 45 mLs (30 g total) by mouth every 6 (six) hours.   midodrine 10 MG tablet Commonly known as:  PROAMATINE Take 1 tablet (10 mg total) by mouth 3 (three) times daily with meals.   pantoprazole 40 MG tablet Commonly known as: PROTONIX Take 1 tablet (40 mg total) by mouth daily at 6 (six) AM.   propranolol 10 MG tablet Commonly known as: INDERAL Take 1 tablet (10 mg total) by mouth 2 (two) times daily.   sodium bicarbonate 650 MG tablet Take 1 tablet (650 mg total) by mouth 2 (two) times daily.   spironolactone 50 MG tablet Commonly known as: ALDACTONE Tome 1 tableta (50 mg en total) por va oral diariamente. (Take 1 tablet (50 mg total) by mouth daily.) What changed: Another medication with the same name was  removed. Continue taking this medication, and follow the directions you see here.   sulfamethoxazole-trimethoprim 800-160 MG tablet Commonly known as: BACTRIM DS Tome 1 tableta por va oral diariamente. (Take 1 tablet by mouth daily.)        Follow-up Information     [name], MD. Schedule an appointment as soon as possible for a visit in 1 week(s).   Specialty: Pulmonary Disease Contact information: [address] [place] [address] [phone]                No Known Allergies   Other Procedures/Studies: IR Paracentesis  Result Date: 07/01/2022 INDICATION: Patient with a history of alcoholic cirrhosis with recurrent ascites. Interventional radiology asked to perform a diagnostic and therapeutic paracentesis. EXAM: ULTRASOUND GUIDED PARACENTESIS MEDICATIONS: 1% lidocaine 10 mL COMPLICATIONS: None immediate. PROCEDURE: Informed written consent was obtained from the patient after a discussion of the risks, benefits and alternatives to treatment. A timeout was performed prior to the initiation of the procedure. Initial ultrasound scanning demonstrates a large amount of ascites within the right lower abdominal quadrant. The right lower abdomen was prepped and draped in the usual sterile fashion. 1% lidocaine was used for local anesthesia. Following this, a 19 gauge, 7-cm, Yueh catheter was introduced. An ultrasound image was saved for documentation purposes. The paracentesis was performed. The catheter was removed and a dressing was applied. The patient tolerated the procedure well without immediate post procedural complication. FINDINGS: A total of approximately 4 L of orange/yellow fluid was removed. Samples were sent to the laboratory as requested by the clinical team. IMPRESSION: Successful ultrasound-guided paracentesis yielding 4 liters of peritoneal fluid. Procedure performed by Alwyn Ren NP and was supervised by Dr. Grace Isaac. PLAN: The patient has  previously been formally evaluated by the [facility] Interventional Radiology [facility] and is being actively followed for potential future intervention. Please see note written by Dr. Elby Showers 06/06/22. Electronically Signed   By: Simonne Come M.D.   On: 07/01/2022 15:02   IR Paracentesis  Result Date: 06/23/2022 INDICATION: Patient with history of ETOH cirrhosis, recurrent ascites, recurrent SBP. Patient currently in the ED with abdominal pain and concern for SBP. Request for diagnostic and therapeutic paracentesis. EXAM: ULTRASOUND GUIDED DIAGNOSTIC AND THERAPEUTIC PARACENTESIS MEDICATIONS: 6 mL 1% lidocaine COMPLICATIONS: None immediate. PROCEDURE: Informed written consent was obtained from the patient after a discussion of the risks, benefits and alternatives to treatment. A timeout was performed prior to the initiation of the procedure. Initial ultrasound scanning demonstrates a large amount of ascites within the left lower abdominal quadrant. The left lower abdomen was prepped and draped in the usual sterile fashion. 1% lidocaine was used for local anesthesia. Following this, a 19 gauge, 7-cm, Yueh catheter was introduced. An ultrasound image was saved for documentation  purposes. The paracentesis was performed. The catheter was removed and a dressing was applied. The patient tolerated the procedure well without immediate post procedural complication. FINDINGS: A total of approximately 6.3 L of hazy, dark yellow fluid was removed. IMPRESSION: Successful ultrasound-guided paracentesis yielding 6.3 liters of peritoneal fluid. Performed by Lynnette Caffey, PA-C Electronically Signed   By: Richarda Overlie M.D.   On: 06/23/2022 18:33   CT ABDOMEN PELVIS W CONTRAST  Result Date: 06/23/2022 CLINICAL DATA:  Abdominal pain with nausea vomiting. History of liver failure. EXAM: CT ABDOMEN AND PELVIS WITH CONTRAST TECHNIQUE: Multidetector CT imaging of the abdomen and pelvis was performed using the  standard protocol following bolus administration of intravenous contrast. RADIATION DOSE REDUCTION: This exam was performed according to the departmental dose-optimization program which includes automated exposure control, adjustment of the mA and/or kV according to patient size and/or use of iterative reconstruction technique. CONTRAST:  75mL OMNIPAQUE IOHEXOL 350 MG/ML SOLN COMPARISON:  Ultrasound 06/02/2022.  CT 10/29/2018. FINDINGS: Lower chest: Small left and tiny right pleural effusion with adjacent bandlike opacities. Atelectasis is favored. Breathing motion at the lung bases. Gynecomastia. Trace pericardial Hepatobiliary: Small fatty liver with nodular contour consistent with the history of chronic liver disease. No obvious enhancing liver lesion gallbladder is mildly distended with dependent stones. Patent portal vein. Diameter of the main portal vein at the liver hilum measuring 17 mm. Pancreas: Unremarkable. No pancreatic ductal dilatation or surrounding inflammatory changes. Spleen: Normal in size without focal abnormality. Adrenals/Urinary Tract: Adrenal glands are unremarkable. Kidneys are normal, without renal calculi, focal lesion, or hydronephrosis. Bladder is underdistended but has diffuse wall thickening. Stomach/Bowel: No oral contrast. The large bowel is of normal course and caliber with diffuse colonic wall thickening. The wall thickening is more than expected for some please hepatic biliary ascites and is increased from previous. Please correlate for an underlying colitis. The stomach and small bowel are nondilated. These loops also have some wall thickening but is more mild. Normal appendix. Vascular/Lymphatic: Aortic atherosclerosis. No enlarged abdominal or pelvic lymph nodes. Reproductive: Prostate is unremarkable. Presumed left testicle in the inguinal canal. Other: Amount of ascites. No free air. Ascites is extending to the small umbilical hernia. Musculoskeletal: Mild degenerative  changes. IMPRESSION: Again changes of chronic liver disease with nodular fatty liver with significant ascites. Patent portal vein. No enhancing liver lesion. There is multifocal areas of wall thickening along the bowel which is nondilated. However the wall thickening along the colon appears out of proportion and increased from previous. Underlying colitis is possible. Gallstones. Small pleural effusions, left-greater-than-right. Electronically Signed   By: Karen Kays M.D.   On: 06/23/2022 13:03   US ABDOMEN LIMITED WITH LIVER DOPPLER  Result Date: 06/02/2022 CLINICAL DATA:  Decompensated hepatic cirrhosis EXAM: DUPLEX ULTRASOUND OF LIVER TECHNIQUE: Color and duplex Doppler ultrasound was performed to evaluate the hepatic in-flow and out-flow vessels. COMPARISON:  CT 02/28/2022 and previous FINDINGS: Liver: Echogenic parenchyma.  Nodular hepatic contour. No focal lesion, mass or intrahepatic biliary ductal dilatation. Main Portal Vein size: 1.7 cm Portal Vein Velocities (all hepatopetal): Main Prox:  16 cm/sec Main Mid: 35 cm/sec Main Dist:  34 cm/sec Right: 24 cm/sec Left: 30 cm/sec Hepatic Vein Velocities (all hepatofugal): Right:  22 cm/sec Middle:  33 cm/sec Left:  125 cm/sec IVC: Present and patent with normal respiratory phasicity. Limited static color flow images demonstrate no signal in the inferior intrahepatic portion of the IVC on limited sagittal images, without confirmatory waveforms, axial or cine images. Hepatic  Artery Velocity:  48 cm/sec Splenic Vein Velocity:  22 cm/sec Spleen: 14.7 cm x 7.5 cm x 7.2 cm with a total volume of 414 cm^3 (411 cm^3 is upper limit normal) Portal Vein Occlusion/Thrombus: Antegrade flow through the visualized portal venous system. Narrowing of the color signal in the mid segment of the main portal vein without significantly elevated velocities. Splenic Vein Occlusion/Thrombus: No Ascites: Moderate Varices: None None IMPRESSION: 1. Hepatic cirrhosis and sequelae of  portal hypertension as above. 2. Limited assessment of  IVC patency. 3. Patent portal venous system with hepatopetal flow. 4. No evidence of hepatic vein thrombosis. 5. Moderate ascites. Electronically Signed   By: Corlis Leak M.D.   On: 06/02/2022 14:51   IR Paracentesis  Result Date: 06/02/2022 INDICATION: Cirrhosis with recurrent ascites. Request for diagnostic and therapeutic paracentesis. EXAM: ULTRASOUND GUIDED RIGHT LOWER QUADRANT PARACENTESIS MEDICATIONS: 1% plain lidocaine, 5 mL COMPLICATIONS: None immediate. PROCEDURE: Informed written consent was obtained from the patient after a discussion of the risks, benefits and alternatives to treatment. A timeout was performed prior to the initiation of the procedure. Initial ultrasound scanning demonstrates a large amount of ascites within the right lower abdominal quadrant. The right lower abdomen was prepped and draped in the usual sterile fashion. 1% lidocaine was used for local anesthesia. Following this, a 19 gauge, 7-cm, Yueh catheter was introduced. An ultrasound image was saved for documentation purposes. The paracentesis was performed. The catheter was removed and a dressing was applied. The patient tolerated the procedure well without immediate post procedural complication. FINDINGS: A total of approximately 3.3 L of clear yellow fluid was removed. Samples were sent to the laboratory as requested by the clinical team. IMPRESSION: Successful ultrasound-guided paracentesis yielding 3.3 liters of peritoneal fluid. Read by: Brayton El PA-C Electronically Signed   By: Olive Bass M.D.   On: 06/02/2022 11:18     TODAY-DAY OF DISCHARGE:  Subjective:   Mathew [name] today has no headache,no chest abdominal pain,no new weakness tingling or numbness, feels much better wants to go home today.   Objective:   Blood pressure 101/65, pulse 73, temperature 98.9 F (37.2 C), temperature source Oral, resp. rate 17, height 5\' 1"  (1.549 m), weight  77 kg, SpO2 96 %.  Intake/Output Summary (Last 24 hours) at 07/02/2022 0921 Last data filed at 07/01/2022 1817 Gross per 24 hour  Intake 1331.27 ml  Output 1 ml  Net 1330.27 ml   Filed Weights   06/23/22 1043  Weight: 77 kg    Exam: Awake Alert, Oriented *3, No new F.N deficits, Normal affect [place].AT,PERRAL Supple Neck,No JVD, No cervical lymphadenopathy appriciated.  Symmetrical Chest wall movement, Good air movement bilaterally, CTAB RRR,No Gallops,Rubs or new Murmurs, No Parasternal Heave +ve B.Sounds, Abd Soft, Non tender, No organomegaly appriciated, No rebound -guarding or rigidity. No Cyanosis, Clubbing or edema, No new Rash or bruise   PERTINENT RADIOLOGIC STUDIES: IR Paracentesis  Result Date: 07/01/2022 INDICATION: Patient with a history of alcoholic cirrhosis with recurrent ascites. Interventional radiology asked to perform a diagnostic and therapeutic paracentesis. EXAM: ULTRASOUND GUIDED PARACENTESIS MEDICATIONS: 1% lidocaine 10 mL COMPLICATIONS: None immediate. PROCEDURE: Informed written consent was obtained from the patient after a discussion of the risks, benefits and alternatives to treatment. A timeout was performed prior to the initiation of the procedure. Initial ultrasound scanning demonstrates a large amount of ascites within the right lower abdominal quadrant. The right lower abdomen was prepped and draped in the usual sterile fashion. 1% lidocaine was used for  local anesthesia. Following this, a 19 gauge, 7-cm, Yueh catheter was introduced. An ultrasound image was saved for documentation purposes. The paracentesis was performed. The catheter was removed and a dressing was applied. The patient tolerated the procedure well without immediate post procedural complication. FINDINGS: A total of approximately 4 L of orange/yellow fluid was removed. Samples were sent to the laboratory as requested by the clinical team. IMPRESSION: Successful ultrasound-guided paracentesis  yielding 4 liters of peritoneal fluid. Procedure performed by Alwyn Ren NP and was supervised by Dr. Grace Isaac. PLAN: The patient has previously been formally evaluated by the [facility] Interventional Radiology Portal Hypertension [facility] and is being actively followed for potential future intervention. Please see note written by Dr. Elby Showers 06/06/22. Electronically Signed   By: Simonne Come M.D.   On: 07/01/2022 15:02     PERTINENT LAB RESULTS: CBC: Recent Labs    06/30/22 0412 07/01/22 0855  WBC 1.4* 1.5*  HGB 8.4* 8.6*  HCT 24.3* 24.9*  PLT 21* 29*   CMET CMP     Component Value Date/Time   NA 133 (L) 06/30/2022 0412   NA 132 (L) 06/08/2022 1048   K 3.8 06/30/2022 0412   CL 105 06/30/2022 0412   CO2 19 (L) 06/30/2022 0412   GLUCOSE 88 06/30/2022 0412   BUN 9 06/30/2022 0412   BUN 5 (L) 06/08/2022 1048   CREATININE 0.61 06/30/2022 0412   CREATININE 0.47 (L) 03/08/2021 1439   CALCIUM 8.7 (L) 06/30/2022 0412   PROT 5.7 (L) 06/30/2022 0412   PROT 5.7 (L) 06/08/2022 1048   ALBUMIN 4.4 06/30/2022 0412   ALBUMIN 3.4 (L) 06/08/2022 1048   AST 27 06/30/2022 0412   AST 50 (H) 03/08/2021 1439   ALT 12 06/30/2022 0412   ALT 19 03/08/2021 1439   ALKPHOS 46 06/30/2022 0412   BILITOT 3.9 (H) 06/30/2022 0412   BILITOT 2.3 (H) 06/08/2022 1048   BILITOT 2.6 (H) 03/08/2021 1439   EGFR 128 06/08/2022 1048   GFRNONAA >60 06/30/2022 0412   GFRNONAA >60 03/08/2021 1439    GFR Estimated Creatinine Clearance: 103.7 mL/min (by C-G formula based on SCr of 0.61 mg/dL). No results for input(s): "LIPASE", "AMYLASE" in the last 72 hours. No results for input(s): "CKTOTAL", "CKMB", "CKMBINDEX", "TROPONINI" in the last 72 hours. Invalid input(s): "POCBNP" No results for input(s): "DDIMER" in the last 72 hours. No results for input(s): "HGBA1C" in the last 72 hours. No results for input(s): "CHOL", "HDL", "LDLCALC", "TRIG", "CHOLHDL", "LDLDIRECT" in the last 72 hours. No results for input(s):  "TSH", "T4TOTAL", "T3FREE", "THYROIDAB" in the last 72 hours.  Invalid input(s): "FREET3" No results for input(s): "VITAMINB12", "FOLATE", "FERRITIN", "TIBC", "IRON", "RETICCTPCT" in the last 72 hours. Coags: No results for input(s): "INR" in the last 72 hours.  Invalid input(s): "PT" Microbiology: Recent Results (from the past 240 hour(s))  Body fluid culture w Gram Stain     Status: None   Collection Time: 06/23/22  4:50 PM   Specimen: Abdomen; Peritoneal Fluid  Result Value Ref Range Status   Specimen Description ABDOMEN PERITONEAL  Final   Special Requests NONE  Final   Gram Stain   Final    RARE WBC PRESENT,BOTH PMN AND MONONUCLEAR NO ORGANISMS SEEN    Culture   Final    NO GROWTH 3 DAYS Performed at [facility], [address], [place], [place] [address]    Report Status 06/26/2022 FINAL  Final  Culture, blood (Routine X 2) w Reflex to ID  Panel     Status: None   Collection Time: 06/27/22  9:18 AM   Specimen: BLOOD  Result Value Ref Range Status   Specimen Description BLOOD RIGHT ANTECUBITAL  Final   Special Requests   Final    BOTTLES DRAWN AEROBIC AND ANAEROBIC Blood Culture adequate volume   Culture   Final    NO GROWTH 5 DAYS Performed at [facility], [address] [place], [place] [address]    Report Status 07/02/2022 FINAL  Final  Culture, blood (Routine X 2) w Reflex to ID Panel     Status: None   Collection Time: 06/27/22  9:18 AM   Specimen: BLOOD RIGHT HAND  Result Value Ref Range Status   Specimen Description BLOOD RIGHT HAND  Final   Special Requests   Final    BOTTLES DRAWN AEROBIC AND ANAEROBIC Blood Culture adequate volume   Culture   Final    NO GROWTH 5 DAYS Performed at [facility], [address], [place], [place] [address]    Report Status 07/02/2022 FINAL  Final  Culture, body fluid w Gram Stain-bottle     Status: None (Preliminary result)   Collection Time: 07/01/22  9:54 AM   Specimen: Peritoneal Washings  Result  Value Ref Range Status   Specimen Description PERITONEAL  Final   Special Requests NONE  Final   Culture   Final    NO GROWTH < 24 HOURS Performed at [facility], [address], [place], [place] [address]    Report Status PENDING  Incomplete  Gram stain     Status: None   Collection Time: 07/01/22  9:54 AM   Specimen: Peritoneal Washings  Result Value Ref Range Status   Specimen Description PERITONEAL  Final   Special Requests NONE  Final   Gram Stain   Final    FEW WBC PRESENT, PREDOMINANTLY MONONUCLEAR RARE SQUAMOUS EPITHELIAL CELLS PRESENT NO ORGANISMS SEEN Performed at [facility], [address], [place], [place] [address]    Report Status 07/01/2022 FINAL  Final    FURTHER DISCHARGE INSTRUCTIONS:  Get Medicines reviewed and adjusted: Please take all your medications with you for your next visit with your Primary MD  Laboratory/radiological data: Please request your Primary MD to go over all hospital tests and procedure/radiological results at the follow up, please ask your Primary MD to get all Hospital records sent to his/her office.  In some cases, they will be blood work, cultures and biopsy results pending at the time of your discharge. Please request that your primary care M.D. goes through all the records of your hospital data and follows up on these results.  Also Note the following: If you experience worsening of your admission symptoms, develop shortness of breath, life threatening emergency, suicidal or homicidal thoughts you must seek medical attention immediately by calling 911 or calling your MD immediately  if symptoms less severe.  You must read complete instructions/literature along with all the possible adverse reactions/side effects for all the Medicines you take and that have been prescribed to you. Take any new Medicines after you have completely understood and accpet all the possible adverse reactions/side effects.   Do not drive when  taking Pain medications or sleeping medications (Benzodaizepines)  Do not take more than prescribed Pain, Sleep and Anxiety Medications. It is not advisable to combine anxiety,sleep and pain medications without talking with your primary care practitioner  Special Instructions: If you have smoked  or chewed Tobacco  in the last 2 yrs please stop smoking, stop any regular Alcohol  and or any Recreational drug use.  Wear Seat belts while driving.  Please note: You were cared for by a hospitalist during your hospital stay. Once you are discharged, your primary care physician will handle any further medical issues. Please note that NO REFILLS for any discharge medications will be authorized once you are discharged, as it is imperative that you return to your primary care physician (or establish a relationship with a primary care physician if you do not have one) for your post hospital discharge needs so that they can reassess your need for medications and monitor your lab values.  Total Time spent coordinating discharge including counseling, education and face to face time equals greater than 30 minutes.  SignedJeoffrey Massed 07/02/2022 9:21 AM

## 2022-07-02 NOTE — Plan of Care (Signed)

## 2022-07-05 ENCOUNTER — Emergency Department (HOSPITAL_COMMUNITY)
Admission: EM | Admit: 2022-07-05 | Discharge: 2022-07-05 | Disposition: A | Discharge status: Home / Self Care | Payer: Self-pay | Attending: Emergency Medicine | Admitting: Emergency Medicine

## 2022-07-05 ENCOUNTER — Encounter (HOSPITAL_COMMUNITY): Payer: Self-pay

## 2022-07-05 DIAGNOSIS — K751 Phlebitis of portal vein: Secondary | ICD-10-CM | POA: Insufficient documentation

## 2022-07-05 DIAGNOSIS — I809 Phlebitis and thrombophlebitis of unspecified site: Secondary | ICD-10-CM

## 2022-07-05 DIAGNOSIS — R233 Spontaneous ecchymoses: Secondary | ICD-10-CM | POA: Insufficient documentation

## 2022-07-05 LAB — CBC
HCT: 25.9 % — ABNORMAL LOW (ref 39.0–52.0)
Hemoglobin: 8.8 g/dL — ABNORMAL LOW (ref 13.0–17.0)
MCH: 35.8 pg — ABNORMAL HIGH (ref 26.0–34.0)
MCHC: 34 g/dL (ref 30.0–36.0)
MCV: 105.3 fL — ABNORMAL HIGH (ref 80.0–100.0)
Platelets: 42 10*3/uL — ABNORMAL LOW (ref 150–400)
RBC: 2.46 MIL/uL — ABNORMAL LOW (ref 4.22–5.81)
RDW: 14.1 % (ref 11.5–15.5)
WBC: 1.9 10*3/uL — ABNORMAL LOW (ref 4.0–10.5)
nRBC: 0 % (ref 0.0–0.2)

## 2022-07-05 LAB — COMPREHENSIVE METABOLIC PANEL
ALT: 21 U/L (ref 0–44)
AST: 39 U/L (ref 15–41)
Albumin: 4.4 g/dL (ref 3.5–5.0)
Alkaline Phosphatase: 70 U/L (ref 38–126)
Anion gap: 8 (ref 5–15)
BUN: 15 mg/dL (ref 6–20)
CO2: 17 mmol/L — ABNORMAL LOW (ref 22–32)
Calcium: 9.1 mg/dL (ref 8.9–10.3)
Chloride: 106 mmol/L (ref 98–111)
Creatinine, Ser: 0.88 mg/dL (ref 0.61–1.24)
GFR, Estimated: >60 mL/min (ref >60)
Glucose, Bld: 107 mg/dL — ABNORMAL HIGH (ref 70–99)
Potassium: 4.1 mmol/L (ref 3.5–5.1)
Sodium: 131 mmol/L — ABNORMAL LOW (ref 135–145)
Total Bilirubin: 4.2 mg/dL — ABNORMAL HIGH (ref 0.3–1.2)
Total Protein: 6.2 g/dL — ABNORMAL LOW (ref 6.5–8.1)

## 2022-07-05 LAB — PROTIME-INR
INR: 3 — ABNORMAL HIGH (ref 0.8–1.2)
Prothrombin Time: 31.1 seconds — ABNORMAL HIGH (ref 11.4–15.2)

## 2022-07-05 LAB — MAGNESIUM: Magnesium: 1.9 mg/dL (ref 1.7–2.4)

## 2022-07-05 LAB — LIPASE, BLOOD: Lipase: 49 U/L (ref 11–51)

## 2022-07-05 NOTE — ED Provider Notes (Signed)
[place] EMERGENCY DEPARTMENT AT [facility] Provider Note   CSN: [id number] Arrival date & time: 07/05/22  1044     History  Chief Complaint  Patient presents with   Wound Check   Abdominal Pain    Mathew [name] is a 42 y.o. male, history of cirrhosis, who presents to the ED secondary to bruising on his left arm, and his right abdomen from his paracentesis and ultrasound that he had a few days ago.  He denies any pain at the sites except for he has had a little bit of redness and swelling on the left arm.  Reports mildly tender.  Denies any shortness of breath or chest pain.     Home Medications Prior to Admission medications   Medication Sig Start Date End Date Taking? Authorizing Provider  ferrous sulfate 325 (65 FE) MG tablet Take 1 tablet (325 mg total) by mouth daily with breakfast. 07/02/22   Ghimire, Werner Lean, MD  folic acid (FOLVITE) 1 MG tablet Take 1 tablet (1 mg total) by mouth daily. 07/02/22   Ghimire, Werner Lean, MD  furosemide (LASIX) 20 MG tablet Take 1 tablet (20 mg total) by mouth daily. 07/02/22   Ghimire, Werner Lean, MD  lactulose (CHRONULAC) 10 GM/15ML solution Take 45 mLs (30 g total) by mouth every 6 (six) hours. 07/02/22   Ghimire, Werner Lean, MD  midodrine (PROAMATINE) 10 MG tablet Take 1 tablet (10 mg total) by mouth 3 (three) times daily with meals. 07/02/22   Ghimire, Werner Lean, MD  pantoprazole (PROTONIX) 40 MG tablet Take 1 tablet (40 mg total) by mouth daily at 6 (six) AM. 07/02/22   Ghimire, Werner Lean, MD  propranolol (INDERAL) 10 MG tablet Take 1 tablet (10 mg total) by mouth 2 (two) times daily. 07/02/22   Ghimire, Werner Lean, MD  sodium bicarbonate 650 MG tablet Take 1 tablet (650 mg total) by mouth 2 (two) times daily. 07/02/22   Ghimire, Werner Lean, MD  spironolactone (ALDACTONE) 50 MG tablet Take 1 tablet (50 mg total) by mouth daily. 07/02/22   Ghimire, Werner Lean, MD  sulfamethoxazole-trimethoprim (BACTRIM DS) 800-160 MG tablet Take 1  tablet by mouth daily. 07/02/22   Ghimire, Werner Lean, MD      Allergies    Patient has no known allergies.    Review of Systems   Review of Systems  Skin:  Positive for color change.    Physical Exam Updated Vital Signs BP 132/73 (BP Location: Right Arm)   Pulse 70   Temp 98.4 F (36.9 C) (Oral)   Resp 18   Ht 5\' 1"  (1.549 m)   Wt 76.7 kg   SpO2 99%   BMI 31.93 kg/m  Physical Exam Vitals and nursing note reviewed.  Constitutional:      General: He is not in acute distress.    Appearance: He is well-developed.  HENT:     Head: Normocephalic and atraumatic.  Eyes:     Conjunctiva/sclera: Conjunctivae normal.  Cardiovascular:     Rate and Rhythm: Normal rate and regular rhythm.     Heart sounds: No murmur heard. Pulmonary:     Effort: Pulmonary effort is normal. No respiratory distress.     Breath sounds: Normal breath sounds.  Abdominal:     Palpations: Abdomen is soft.     Tenderness: There is no abdominal tenderness.  Musculoskeletal:        General: No swelling.     Cervical back: Neck supple.  Skin:    General: Skin is warm and dry.     Capillary Refill: Capillary refill takes less than 2 seconds.     Comments: Ecchymosis, slight cord on the left lower arm.  Ecchymosis to right abdomen.  No overt swelling of the left upper extremity.  Neurological:     Mental Status: He is alert.  Psychiatric:        Mood and Affect: Mood normal.     ED Results / Procedures / Treatments   Labs (all labs ordered are listed, but only abnormal results are displayed) Labs Reviewed  COMPREHENSIVE METABOLIC PANEL - Abnormal; Notable for the following components:      Result Value   Sodium 131 (*)    CO2 17 (*)    Glucose, Bld 107 (*)    Total Protein 6.2 (*)    Total Bilirubin 4.2 (*)    All other components within normal limits  CBC - Abnormal; Notable for the following components:   WBC 1.9 (*)    RBC 2.46 (*)    Hemoglobin 8.8 (*)    HCT 25.9 (*)    MCV 105.3 (*)     MCH 35.8 (*)    Platelets 42 (*)    All other components within normal limits  PROTIME-INR - Abnormal; Notable for the following components:   Prothrombin Time 31.1 (*)    INR 3.0 (*)    All other components within normal limits  LIPASE, BLOOD  MAGNESIUM  URINALYSIS, ROUTINE W REFLEX MICROSCOPIC    EKG None  Radiology No results found.  Procedures Procedures    Medications Ordered in ED Medications - No data to display  ED Course/ Medical Decision Making/ A&P                             Medical Decision Making Patient is a 42 year old male, here for ecchymosis of left arm, and right side of abdomen after having IV, and paracentesis at the sites.  Also reports Krishna Dancel area on the left arm where the IV was, that is little red and swollen.  Denies any fevers, chills.  Will obtain blood work, to evaluate his platelets.  Amount and/or Complexity of Data Reviewed Labs: ordered.    Details: Platelets improved from last week, up to 42 Discussion of management or test interpretation with external provider(s): Patient well-appearing, no swelling to the left upper extremity, just has slight cord, left forearm, I believe this is likely secondary to phlebitis, Dr. Carles Collet evaluated, in agreement.  I am instructed him to use warm compresses to, help with this discomfort and return if his arm becomes swollen or extremely painful.  Believe that his ecchymosis is likely secondary to his thrombocytopenia, which is resulting in lack of clotting.  From his cirrhosis.  We discussed that this will resolve with time.  Return precautions emphasized discharged home.   Final Clinical Impression(s) / ED Diagnoses Final diagnoses:  Petechiae or ecchymoses  Phlebitis    Rx / DC Orders ED Discharge Orders     None         Pete Pelt, PA 07/05/22 1343    Jacalyn Lefevre, MD 07/08/22 [phone]

## 2022-07-05 NOTE — Discharge Instructions (Signed)
Please follow-up with your primary care doctor, your bruising is likely secondary to your liver disease.  Also the swelling in your arm is likely secondary to a very superficial blood clot, you should use warm compresses for this, and if your arm becomes extremely swollen please return to the ER for an ultrasound.
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
## 2022-07-06 ENCOUNTER — Ambulatory Visit: Payer: Self-pay | Admitting: Gastroenterology

## 2022-07-06 LAB — CULTURE, BODY FLUID W GRAM STAIN -BOTTLE: Culture: NO GROWTH

## 2022-07-06 NOTE — Progress Notes (Deleted)
HPI :  Admitted in May with SBP 42 y.o. male with past medical history significant for compensated cirrhosis, history of SBP, umbilical hernia, alcohol abuse, tobacco abuse, esophageal varices, presents for evaluation of abdominal pain.   Patient presented to ED 5/20 p.m. for abdominal pain that has been going on for 2 to 3 days.  States he has had severe pain across his abdomen which has been intermittent and getting progressively worse.  Exacerbated by movement and by touch.  Reports he may have had some fevers, no chills.  Denies melena/hematochezia.  Denies nausea/vomiting.  States he drank 2 beers on Saturday.  Upon arrival alcohol level 243.   He underwent diagnostic/therapeutic paracentesis 5/16 with 4.8 L removed.  Labs were negative for bacterial peritonitis and had no growth on cultures.   Patient was given Rocephin 2G IV for prophylaxis in ED prior to paracentesis   Repeat diagnostic/therapeutic paracentesis on admission 5/21 with 6 L removed.  Culture for gram-negative rods. Cell count/cytology was not obtained.   Upon arrival temperature 100 F, slightly tachycardic with heart rate 113.    EGD 03/2018 inpatient for IDA with Dr. Matthias Hughs: Grade 2 esophageal varices, 2 cm hiatal hernia, portal hypertensive gastropathy, friable gastric mucosa, normal duodenum.   Colonoscopy 03/2018 for IDA: Friable colonic mucosa, no specimens collected    42 y/o male with the following:   Decompensated alcoholic cirrhosis SBP - ESBL E coli Suspected component of alcoholic hepatitis Alcohol abuse Hepatic encephalopathy Coagulopathy   He has completed 5 days of IV antibiotics and feels better. Given IV albumin. Renal function stable. Have held off on steroids for possible component of alcoholic hepatitis given his recent infection. Diuretics were held. Do not recommend any beta blockers moving forward for this patients given his SBP, they do worse with higher risk of hepatorenal syndrome.     He has severe thrombocytopenia / coagulopathy, high risk for variceal banding should he have varices. He has no insurance and not sure if he could do an EGD with banding without that. I think we can resume low dose diuretics as outpatient but needs monitoring of his labs closely. I offered him a consult to see Hepatology as outpatient. I can order that, not sure how much this would cost him without insurance. He needs to completely abstain from alcohol and he seems motivated to do that. Otherwise, continue lactulose.    Finally, difficult situation with his SBP. He had ESBL E coli, he completed a course of Invanz and improved but now needs SBP prophylaxis and his E coli was resistant to Bactrim and Cipro. I am not sure what to offer him for that and maybe ID could help with recommendations for this.   I am happy to see him as outpatient if he can with his insurance status, etc, our office will contact him to schedule it and f/u labs if he is willing.   PLAN: - patient being discharged today - resume lasix 20mg  / day and aldactone 50mg  / day at discharge - low NA diet - complete alcohol abstinence - no beta blockers as outlined given SBP occurrence  - would benefit from EGD at some point - insurance issues may prohibit this and he is high risk for banding - ID consult to help with recommendations for SBP prophylaxis in light of ESBL  Ecoli - continue lactulose - I will offer outpatient follow up and labs if he is willing - needs CMET in 1-2 weeks - outpatient Hepatology referral  Readmitted in June for suspected SBP  Brief Hospital Course: Spontaneous bacterial peritonitis Overall better-completed meropenem-last dose on 6/21 Repeat paracentesis on 6/21-with no evidence of SBP-although cultures are pending. Per discussion with ID-plans are to transition to Bactrim for prophylaxis.     AKI Likely hemodynamically mediated Stabilized with midodrine/IV albumin Creatinine has now  normalized.   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  Feels much better on 6/22 and is requesting discharge. Continue diuretic regimen/midodrine on discharge.   Leukopenia felt to be secondary to underlying liver cirrhosis/hypersplenism.  Platelet count low but stable for the past several days. Follow-up with PCP-repeat CBC in 1 week.  His overall prognosis is poor-as he continues to unfortunately drink alcohol.   EtOH use Has been counseled numerous times in the past-unfortunately even with advanced liver cirrhosis-continues to drain.   Hypokalemia Likely due to EtOH use Repleted   Hyponatremia Mild Secondary to volume overload/cirrhosis-improving resumption of diuretic regimen on 6/18    He did not follow up with hepatology Tried to contact him numerous times for labs - he did not respond to [place]    Past Medical History:  Diagnosis Date   Acute metabolic encephalopathy 06/20/2021   Alcohol withdrawal seizure (HCC)    Cirrhosis (HCC)    ETOH abuse    Pneumonia 04/02/2020   SBP (spontaneous bacterial peritonitis) (HCC) 06/17/2021     Past Surgical History:  Procedure Laterality Date   COLONOSCOPY WITH PROPOFOL N/A 03/17/2018   Procedure: COLONOSCOPY WITH PROPOFOL;  Surgeon: Bernette Redbird, MD;  Location[facility] ENDOSCOPY;  Service: Endoscopy;  Laterality: N/A;   ESOPHAGOGASTRODUODENOSCOPY (EGD) WITH PROPOFOL N/A 03/17/2018   Procedure: ESOPHAGOGASTRODUODENOSCOPY (EGD) WITH PROPOFOL;  Surgeon: Bernette Redbird, MD;  Location: [facility] ENDOSCOPY;  Service: Endoscopy;  Laterality: N/A;   IR PARACENTESIS  03/14/2018   IR PARACENTESIS  11/18/2020   IR PARACENTESIS  06/17/2021   IR PARACENTESIS  08/30/2021   IR PARACENTESIS  09/02/2021   IR PARACENTESIS  11/16/2021   IR PARACENTESIS  03/01/2022   IR  PARACENTESIS  05/31/2022   IR PARACENTESIS  06/02/2022   IR PARACENTESIS  06/23/2022   IR PARACENTESIS  07/01/2022   Family History  Adopted: Yes  Problem Relation Age of Onset   Cancer Neg Hx    Heart disease Neg Hx    Social History   Tobacco Use   Smoking status: Every Day    Packs/day: .25    Types: Cigarettes    Start date: 08/11/2019   Smokeless tobacco: Never  Vaping Use   Vaping Use: Never used  Substance Use Topics   Alcohol use: Yes    Alcohol/week: 2.0 standard drinks of alcohol    Types: 2 Cans of beer per week    Comment: 3-6 beer cans a week   Drug use: No   Current Outpatient Medications  Medication Sig Dispense Refill   ferrous sulfate 325 (65 FE) MG tablet Take 1 tablet (325 mg total) by mouth daily with breakfast. 30 tablet 3   folic acid (FOLVITE) 1 MG tablet Take 1 tablet (1 mg total) by mouth daily. 30 tablet 1   furosemide (LASIX) 20 MG tablet Take 1 tablet (20 mg total) by mouth daily. 60 tablet 3   lactulose (CHRONULAC) 10 GM/15ML solution Take 45 mLs (30 g total) by mouth every 6 (six) hours. 5400 mL 1   midodrine (PROAMATINE) 10  MG tablet Take 1 tablet (10 mg total) by mouth 3 (three) times daily with meals. 90 tablet 3   pantoprazole (PROTONIX) 40 MG tablet Take 1 tablet (40 mg total) by mouth daily at 6 (six) AM. 90 tablet 1   propranolol (INDERAL) 10 MG tablet Take 1 tablet (10 mg total) by mouth 2 (two) times daily. 60 tablet 2   sodium bicarbonate 650 MG tablet Take 1 tablet (650 mg total) by mouth 2 (two) times daily. 120 tablet 1   spironolactone (ALDACTONE) 50 MG tablet Take 1 tablet (50 mg total) by mouth daily. 90 tablet 3   sulfamethoxazole-trimethoprim (BACTRIM DS) 800-160 MG tablet Take 1 tablet by mouth daily. 30 tablet 2   No current facility-administered medications for this visit.   No Known Allergies   Review of Systems: All systems reviewed and negative except where noted in HPI.    IR Paracentesis  Result Date:  07/01/2022 INDICATION: Patient with a history of alcoholic cirrhosis with recurrent ascites. Interventional radiology asked to perform a diagnostic and therapeutic paracentesis. EXAM: ULTRASOUND GUIDED PARACENTESIS MEDICATIONS: 1% lidocaine 10 mL COMPLICATIONS: None immediate. PROCEDURE: Informed written consent was obtained from the patient after a discussion of the risks, benefits and alternatives to treatment. A timeout was performed prior to the initiation of the procedure. Initial ultrasound scanning demonstrates a large amount of ascites within the right lower abdominal quadrant. The right lower abdomen was prepped and draped in the usual sterile fashion. 1% lidocaine was used for local anesthesia. Following this, a 19 gauge, 7-cm, Yueh catheter was introduced. An ultrasound image was saved for documentation purposes. The paracentesis was performed. The catheter was removed and a dressing was applied. The patient tolerated the procedure well without immediate post procedural complication. FINDINGS: A total of approximately 4 L of orange/yellow fluid was removed. Samples were sent to the laboratory as requested by the clinical team. IMPRESSION: Successful ultrasound-guided paracentesis yielding 4 liters of peritoneal fluid. Procedure performed by Alwyn Ren NP and was supervised by Dr. Grace Isaac. PLAN: The patient has previously been formally evaluated by the [facility] and is being actively followed for potential future intervention. Please see note written by Dr. Elby Showers 06/06/22. Electronically Signed   By: Simonne Come M.D.   On: 07/01/2022 15:02   IR Paracentesis  Result Date: 06/23/2022 INDICATION: Patient with history of ETOH cirrhosis, recurrent ascites, recurrent SBP. Patient currently in the ED with abdominal pain and concern for SBP. Request for diagnostic and therapeutic paracentesis. EXAM: ULTRASOUND GUIDED DIAGNOSTIC AND THERAPEUTIC  PARACENTESIS MEDICATIONS: 6 mL 1% lidocaine COMPLICATIONS: None immediate. PROCEDURE: Informed written consent was obtained from the patient after a discussion of the risks, benefits and alternatives to treatment. A timeout was performed prior to the initiation of the procedure. Initial ultrasound scanning demonstrates a large amount of ascites within the left lower abdominal quadrant. The left lower abdomen was prepped and draped in the usual sterile fashion. 1% lidocaine was used for local anesthesia. Following this, a 19 gauge, 7-cm, Yueh catheter was introduced. An ultrasound image was saved for documentation purposes. The paracentesis was performed. The catheter was removed and a dressing was applied. The patient tolerated the procedure well without immediate post procedural complication. FINDINGS: A total of approximately 6.3 L of hazy, dark yellow fluid was removed. IMPRESSION: Successful ultrasound-guided paracentesis yielding 6.3 liters of peritoneal fluid. Performed by Lynnette Caffey, PA-C Electronically Signed   By: Richarda Overlie M.D.   On: 06/23/2022 18:33  CT ABDOMEN PELVIS W CONTRAST  Result Date: 06/23/2022 CLINICAL DATA:  Abdominal pain with nausea vomiting. History of liver failure. EXAM: CT ABDOMEN AND PELVIS WITH CONTRAST TECHNIQUE: Multidetector CT imaging of the abdomen and pelvis was performed using the standard protocol following bolus administration of intravenous contrast. RADIATION DOSE REDUCTION: This exam was performed according to the departmental dose-optimization program which includes automated exposure control, adjustment of the mA and/or kV according to patient size and/or use of iterative reconstruction technique. CONTRAST:  75mL OMNIPAQUE IOHEXOL 350 MG/ML SOLN COMPARISON:  Ultrasound 06/02/2022.  CT 10/29/2018. FINDINGS: Lower chest: Small left and tiny right pleural effusion with adjacent bandlike opacities. Atelectasis is favored. Breathing motion at the lung bases.  Gynecomastia. Trace pericardial Hepatobiliary: Small fatty liver with nodular contour consistent with the history of chronic liver disease. No obvious enhancing liver lesion gallbladder is mildly distended with dependent stones. Patent portal vein. Diameter of the main portal vein at the liver hilum measuring 17 mm. Pancreas: Unremarkable. No pancreatic ductal dilatation or surrounding inflammatory changes. Spleen: Normal in size without focal abnormality. Adrenals/Urinary Tract: Adrenal glands are unremarkable. Kidneys are normal, without renal calculi, focal lesion, or hydronephrosis. Bladder is underdistended but has diffuse wall thickening. Stomach/Bowel: No oral contrast. The large bowel is of normal course and caliber with diffuse colonic wall thickening. The wall thickening is more than expected for some please hepatic biliary ascites and is increased from previous. Please correlate for an underlying colitis. The stomach and small bowel are nondilated. These loops also have some wall thickening but is more mild. Normal appendix. Vascular/Lymphatic: Aortic atherosclerosis. No enlarged abdominal or pelvic lymph nodes. Reproductive: Prostate is unremarkable. Presumed left testicle in the inguinal canal. Other: Amount of ascites. No free air. Ascites is extending to the small umbilical hernia. Musculoskeletal: Mild degenerative changes. IMPRESSION: Again changes of chronic liver disease with nodular fatty liver with significant ascites. Patent portal vein. No enhancing liver lesion. There is multifocal areas of wall thickening along the bowel which is nondilated. However the wall thickening along the colon appears out of proportion and increased from previous. Underlying colitis is possible. Gallstones. Small pleural effusions, left-greater-than-right. Electronically Signed   By: Karen Kays M.D.   On: 06/23/2022 13:03    Physical Exam: There were no vitals taken for this visit. Constitutional:  Pleasant,well-developed, ***male in no acute distress. HEENT: Normocephalic and atraumatic. Conjunctivae are normal. No scleral icterus. Neck supple.  Cardiovascular: Normal rate, regular rhythm.  Pulmonary/chest: Effort normal and breath sounds normal. No wheezing, rales or rhonchi. Abdominal: Soft, nondistended, nontender. Bowel sounds active throughout. There are no masses palpable. No hepatomegaly. Extremities: no edema Lymphadenopathy: No cervical adenopathy noted. Neurological: Alert and oriented to person place and time. Skin: Skin is warm and dry. No rashes noted. Psychiatric: Normal mood and affect. Behavior is normal.   ASSESSMENT: 42 y.o. male here for assessment of the following  No diagnosis found.  PLAN:   [name], MD

## 2022-07-12 ENCOUNTER — Ambulatory Visit: Payer: Self-pay | Admitting: Critical Care Medicine

## 2022-07-12 NOTE — Progress Notes (Deleted)
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 The paracentesis was performed. The catheter was removed and a dressing was applied. The patient tolerated the procedure well without immediate post procedural complication. FINDINGS: A total of approximately 800 mL of straw-colored fluid was removed. Samples were sent to the laboratory  as requested by the clinical team. IMPRESSION: Successful ultrasound-guided paracentesis yielding 800 mL liters of peritoneal fluid. Read by: Anders Grant, NP PLAN: If the patient eventually requires >/=2 paracenteses in a 30 day period, candidacy for formal evaluation by the [facility] will be assessed. Electronically Signed   By: Malachy Moan M.D.   On: 11/16/2021 13:52    Mathew ABDOMEN MRCP W WO CONTAST   Result Date: 11/16/2021 CLINICAL DATA:  Jaundice. Alcohol induced cirrhosis. Presents with abdominal pain. Evaluate for common bile duct  stones. EXAM: MRI ABDOMEN WITHOUT AND WITH CONTRAST (INCLUDING MRCP) TECHNIQUE: Multiplanar multisequence Mathew imaging of the abdomen was performed both before and after the administration of intravenous contrast. Heavily T2-weighted images of the biliary and pancreatic ducts were obtained, and three-dimensional MRCP images were rendered by post processing. CONTRAST:  7mL GADAVIST GADOBUTROL 1 MMOL/ML IV SOLN COMPARISON:  11/15/2021 FINDINGS: Lower chest: There is a small to moderate left pleural effusion. Hepatobiliary: The liver appears shrunken with a diffuse nodular contour compatible with advanced cirrhosis. On the arterial phase images there are no focal enhancing liver lesions identified. No abnormal areas of washout noted on the delayed images. Multiple stones are identified within the gallbladder which measure up to 8 mm as seen on the CT from the prior day. There is diffuse gallbladder wall edema which in the setting of cirrhosis, ascites and portal venous hypertension is nonspecific. The gallbladder wall measures up to 7 mm in thickness, image 19/3. Motion artifact limits assessment of the common bile duct. The common bile duct has a normal caliber measuring 3 mm. No intrahepatic bile duct dilatation. Within the limitation of motion artifact, there are no signs to suggest choledocholithiasis. Pancreas: No mass, inflammatory changes, or other parenchymal abnormality identified. Spleen: Measures 13.8 x 5.7 by 15.7 cm (volume = 650 cm^3). No suspicious splenic lesion. Adrenals/Urinary Tract: Normal adrenal glands. No hydronephrosis or kidney mass identified. Stomach/Bowel: Visualized portions within the abdomen are unremarkable. There is wall thickening involving the ascending colon which may reflect segmental colitis or hepatic colopathy. No pathologic dilatation of the large or small bowel loops. Vascular/Lymphatic: Normal appearance of the abdominal aorta. Upper abdominal varicosities are identified.  Nonocclusive thrombus eccentric mural thrombus identified within the superior mesenteric vein, image 79/1302. The splenic vein and portal vein remain patent. There is diffuse venous congestion identified within the central mesentery. No significant adenopathy identified. Other:  There is a large volume of abdominopelvic ascites. Musculoskeletal: IMPRESSION: 1. Morphologic features of the liver compatible with advanced cirrhosis. No suspicious enhancing liver lesions identified. 2. Stigmata of portal venous hypertension identified including varicosities, splenomegaly and ascites. 3. Gallstones. There is diffuse gallbladder wall edema which in the setting of cirrhosis, ascites and portal venous hypertension is nonspecific. 4. No signs of bile duct dilatation or choledocholithiasis. 5. Nonocclusive thrombus eccentric mural thrombus within the superior mesenteric vein. No thrombus identified within the portal vein or splenic vein. 6. Small to moderate left pleural effusion. 7. Wall thickening involving the ascending colon which may reflect segmental colitis or hepatic colopathy. Electronically Signed   By: Signa Kell M.D.   On: 11/16/2021 11:51    Mathew 3D Recon At Scanner   Result Date: 11/16/2021 CLINICAL DATA:  Jaundice. Alcohol induced cirrhosis. Presents with abdominal pain. Evaluate for common bile duct stones. EXAM: MRI ABDOMEN WITHOUT AND WITH CONTRAST (INCLUDING MRCP) TECHNIQUE: Multiplanar multisequence Mathew imaging of the abdomen was performed both before and after the administration of intravenous contrast.  Heavily T2-weighted images of the biliary and pancreatic ducts were obtained, and three-dimensional MRCP images were rendered by post processing. CONTRAST:  7mL GADAVIST GADOBUTROL 1 MMOL/ML IV SOLN COMPARISON:  11/15/2021 FINDINGS: Lower chest: There is a small to moderate left pleural effusion. Hepatobiliary: The liver appears shrunken with a diffuse nodular contour compatible with advanced  cirrhosis. On the arterial phase images there are no focal enhancing liver lesions identified. No abnormal areas of washout noted on the delayed images. Multiple stones are identified within the gallbladder which measure up to 8 mm as seen on the CT from the prior day. There is diffuse gallbladder wall edema which in the setting of cirrhosis, ascites and portal venous hypertension is nonspecific. The gallbladder wall measures up to 7 mm in thickness, image 19/3. Motion artifact limits assessment of the common bile duct. The common bile duct has a normal caliber measuring 3 mm. No intrahepatic bile duct dilatation. Within the limitation of motion artifact, there are no signs to suggest choledocholithiasis. Pancreas: No mass, inflammatory changes, or other parenchymal abnormality identified. Spleen: Measures 13.8 x 5.7 by 15.7 cm (volume = 650 cm^3). No suspicious splenic lesion. Adrenals/Urinary Tract: Normal adrenal glands. No hydronephrosis or kidney mass identified. Stomach/Bowel: Visualized portions within the abdomen are unremarkable. There is wall thickening involving the ascending colon which may reflect segmental colitis or hepatic colopathy. No pathologic dilatation of the large or small bowel loops. Vascular/Lymphatic: Normal appearance of the abdominal aorta. Upper abdominal varicosities are identified. Nonocclusive thrombus eccentric mural thrombus identified within the superior mesenteric vein, image 79/1302. The splenic vein and portal vein remain patent. There is diffuse venous congestion identified within the central mesentery. No significant adenopathy identified. Other:  There is a large volume of abdominopelvic ascites. Musculoskeletal: IMPRESSION: 1. Morphologic features of the liver compatible with advanced cirrhosis. No suspicious enhancing liver lesions identified. 2. Stigmata of portal venous hypertension identified including varicosities, splenomegaly and ascites. 3. Gallstones. There is  diffuse gallbladder wall edema which in the setting of cirrhosis, ascites and portal venous hypertension is nonspecific. 4. No signs of bile duct dilatation or choledocholithiasis. 5. Nonocclusive thrombus eccentric mural thrombus within the superior mesenteric vein. No thrombus identified within the portal vein or splenic vein. 6. Small to moderate left pleural effusion. 7. Wall thickening involving the ascending colon which may reflect segmental colitis or hepatic colopathy. Electronically Signed   By: Signa Kell M.D.   On: 11/16/2021 11:51    CT ABDOMEN PELVIS W CONTRAST   Result Date: 11/15/2021 CLINICAL DATA:  Flank pain elevated bilirubin EXAM: CT ABDOMEN AND PELVIS WITH CONTRAST TECHNIQUE: Multidetector CT imaging of the abdomen and pelvis was performed using the standard protocol following bolus administration of intravenous contrast. RADIATION DOSE REDUCTION: This exam was performed according to the departmental dose-optimization program which includes automated exposure control, adjustment of the mA and/or kV according to patient size and/or use of iterative reconstruction technique. CONTRAST:  75mL OMNIPAQUE IOHEXOL 350 MG/ML SOLN COMPARISON:  CT 11/05/2021, 09/01/2021 FINDINGS: Lower chest: Lung bases demonstrate interval small left effusion with passive atelectasis at the left base. Hepatobiliary: Liver cirrhosis. Contracted gallbladder with multiple stones. No intra hepatic or extrahepatic biliary dilatation. Questionable small ductal stones, coronal series 6 image 59 Pancreas: Unremarkable. No pancreatic ductal dilatation or surrounding inflammatory changes. Spleen: Enlarged, craniocaudal measurement of 16.2 cm. Adrenals/Urinary Tract: Adrenal glands are normal. Kidneys show no hydronephrosis. Slightly thick-walled urinary bladder Stomach/Bowel: The stomach is moderately enlarged. There is no dilated small bowel. Fluid in  the colon. There is possible wall thickening of the right and proximal  transverse colon. Vascular/Lymphatic: Nonaneurysmal aorta.  No suspicious lymph node Reproductive: Prostate is unremarkable. Other: No free air. Interim development of at least moderate volume abdominopelvic ascites. Small umbilical region hernia containing fluid. Musculoskeletal: No acute osseous abnormality IMPRESSION: 1. Liver cirrhosis. Interim development of at least moderate volume abdominopelvic ascites. Splenomegaly. 2. Contracted gallbladder with multiple stones. Questionable small stones within the common bile duct. No intra or extrahepatic biliary dilatation. 3. Fluid in the colon. Possible wall thickening of the right and proximal transverse colon which may be secondary to colitis or portal colopathy. 4. Interval small left effusion with passive atelectasis at the left base. 5. Slightly thick-walled appearance of the urinary bladder, correlate for cystitis. Electronically Signed   By: Jasmine Pang M.D.   On: 11/15/2021 19:24    DG Chest 2 View   Result Date: 11/11/2021 CLINICAL DATA:  Provided history: Chest pain. Additional history provided: Shortness of breath, history of cirrhosis. EXAM: CHEST - 2 VIEW COMPARISON:  Prior chest radiographs 11/05/2021 and earlier. FINDINGS: Heart size within normal limits. No appreciable airspace consolidation. No evidence of pleural effusion or pneumothorax. No acute bony abnormality identified. IMPRESSION: No evidence of active cardiopulmonary disease. Electronically Signed   By: Jackey Loge D.O.   On: 11/11/2021 13:11    DG ELBOW COMPLETE LEFT (3+VIEW)   Result Date: 11/07/2021 CLINICAL DATA:  Pain after fall this morning. EXAM: LEFT ELBOW - COMPLETE 3+ VIEW COMPARISON:  None Available. FINDINGS: There is no evidence of fracture, dislocation, or joint effusion. There is no evidence of arthropathy or other focal bone abnormality. Soft tissues are unremarkable. IMPRESSION: Negative. Electronically Signed   By: Gerome Sam III M.D.   On: 11/07/2021 09:58     ECHOCARDIOGRAM COMPLETE   Result Date: 11/06/2021    ECHOCARDIOGRAM REPORT   Patient Name:   Mathew [name] Date of Exam: 11/06/2021 Medical Rec #:  [id number]            Height:       65.0 in Accession #:    [id number]           Weight:       183.7 lb Date of Birth:  Jun 07, 1980            BSA:          1.908 m Patient Age:    41 years             BP:           110/58 mmHg Patient Gender: M                    HR:           85 bpm. Exam Location:  Inpatient Procedure: 2D Echo, Cardiac Doppler and Color Doppler Indications:    R07.9 Chest Pain  History:        Patient has prior history of Echocardiogram examinations, most                 recent 06/17/2021. Tobacco and ETOH abuse, Cirrhosis of liver.  Sonographer:    Celesta Gentile RCS Referring Phys: Effie Shy [place] [facility] IMPRESSIONS  1. Left ventricular ejection fraction, by estimation, is 65 to 70%. The left ventricle has normal function. The left ventricle has no regional wall motion abnormalities. There is mild concentric left ventricular hypertrophy. Left ventricular diastolic parameters are consistent with Grade I diastolic  dysfunction (impaired relaxation).  2. Right ventricular systolic function is hyperdynamic. The right ventricular size is normal. There is normal pulmonary artery systolic pressure.  3. Left atrial size was mildly dilated.  4. The mitral valve is normal in structure. No evidence of mitral valve regurgitation. No evidence of mitral stenosis.  5. The aortic valve is tricuspid. Aortic valve regurgitation is not visualized. No aortic stenosis is present.  6. The inferior vena cava is normal in size with greater than 50% respiratory variability, suggesting right atrial pressure of 3 mmHg. Comparison(s): No significant change from prior study. FINDINGS  Left Ventricle: Left ventricular ejection fraction, by estimation, is 65 to 70%. The left ventricle has normal function. The left ventricle has no regional wall motion abnormalities. The  left ventricular internal cavity size was normal in size. There is  mild concentric left ventricular hypertrophy. Left ventricular diastolic parameters are consistent with Grade I diastolic dysfunction (impaired relaxation). Right Ventricle: The right ventricular size is normal. No increase in right ventricular wall thickness. Right ventricular systolic function is hyperdynamic. There is normal pulmonary artery systolic pressure. The tricuspid regurgitant velocity is 2.30 m/s, and with an assumed right atrial pressure of 3 mmHg, the estimated right ventricular systolic pressure is 24.2 mmHg. Left Atrium: Left atrial size was mildly dilated. Right Atrium: Right atrial size was normal in size. Pericardium: There is no evidence of pericardial effusion. Mitral Valve: The mitral valve is normal in structure. No evidence of mitral valve regurgitation. No evidence of mitral valve stenosis. Tricuspid Valve: The tricuspid valve is normal in structure. Tricuspid valve regurgitation is mild . No evidence of tricuspid stenosis. Aortic Valve: The aortic valve is tricuspid. Aortic valve regurgitation is not visualized. No aortic stenosis is present. Aortic valve mean gradient measures 7.0 mmHg. Aortic valve peak gradient measures 14.4 mmHg. Aortic valve area, by VTI measures 2.11  cm. Pulmonic Valve: The pulmonic valve was not well visualized. Pulmonic valve regurgitation is not visualized. Aorta: The aortic root is normal in size and structure and the ascending aorta was not well visualized. Venous: The inferior vena cava is normal in size with greater than 50% respiratory variability, suggesting right atrial pressure of 3 mmHg. IAS/Shunts: No atrial level shunt detected by color flow Doppler.  LEFT VENTRICLE PLAX 2D LVIDd:         4.60 cm   Diastology LVIDs:         2.50 cm   LV e' medial:    8.05 cm/s LV PW:         1.20 cm   LV E/e' medial:  7.3 LV IVS:        1.20 cm   LV e' lateral:   9.14 cm/s LVOT diam:     1.90 cm   LV  E/e' lateral: 6.5 LV SV:         73 LV SV Index:   38 LVOT Area:     2.84 cm  RIGHT VENTRICLE RV S prime:     23.50 cm/s TAPSE (M-mode): 2.4 cm LEFT ATRIUM             Index        RIGHT ATRIUM           Index LA diam:        4.70 cm 2.46 cm/m   RA Area:     20.30 cm LA Vol (A2C):   55.8 ml 29.24 ml/m  RA Volume:   58.90 ml  30.87 ml/m LA  Vol (A4C):   70.3 ml 36.84 ml/m LA Biplane Vol: 62.9 ml 32.97 ml/m  AORTIC VALVE AV Area (Vmax):    1.72 cm AV Area (Vmean):   2.00 cm AV Area (VTI):     2.11 cm AV Vmax:           190.00 cm/s AV Vmean:          117.500 cm/s AV VTI:            0.349 m AV Peak Grad:      14.4 mmHg AV Mean Grad:      7.0 mmHg LVOT Vmax:         115.00 cm/s LVOT Vmean:        82.900 cm/s LVOT VTI:          0.259 m LVOT/AV VTI ratio: 0.74  AORTA Ao Root diam: 3.10 cm MITRAL VALVE               TRICUSPID VALVE MV Area (PHT): 2.66 cm    TR Peak grad:   21.2 mmHg MV Decel Time: 285 msec    TR Vmax:        230.00 cm/s MV E velocity: 59.10 cm/s MV A velocity: 75.20 cm/s  SHUNTS MV E/A ratio:  0.79        Systemic VTI:  0.26 m                            Systemic Diam: 1.90 cm Riley Lam MD Electronically signed by Riley Lam MD Signature Date/Time: 11/06/2021/3:59:57 PM    Final     [place] ASCITES (ABDOMEN LIMITED)   Result Date: 11/06/2021 CLINICAL DATA:  Assess ascites. EXAM: LIMITED ABDOMEN ULTRASOUND FOR ASCITES TECHNIQUE: Limited ultrasound survey for ascites was performed in all four abdominal quadrants. COMPARISON:  None Available. FINDINGS: A small amount of ascites is identified throughout the abdomen. No drainable collection of ascites is noted. IMPRESSION: Small amount of ascites. The amount of ascites is not amenable to paracentesis. Electronically Signed   By: Gerome Sam III M.D.   On: 11/06/2021 10:13    CT Head Wo Contrast   Result Date: 11/05/2021 CLINICAL DATA:  Cirrhosis, altered level of consciousness, confusion EXAM: CT HEAD WITHOUT CONTRAST  TECHNIQUE: Contiguous axial images were obtained from the base of the skull through the vertex without intravenous contrast. RADIATION DOSE REDUCTION: This exam was performed according to the departmental dose-optimization program which includes automated exposure control, adjustment of the mA and/or kV according to patient size and/or use of iterative reconstruction technique. COMPARISON:  06/30/2021 FINDINGS: Brain: No acute infarct or hemorrhage. Lateral ventricles and midline structures are unremarkable. No acute extra-axial fluid collections. No mass effect. Vascular: No hyperdense vessel or unexpected calcification. Skull: Normal. Negative for fracture or focal lesion. Sinuses/Orbits: No acute finding. Other: None. IMPRESSION: 1. No acute intracranial process. Electronically Signed   By: Sharlet Salina M.D.   On: 11/05/2021 18:08    CT ABDOMEN PELVIS W CONTRAST   Result Date: 11/05/2021 CLINICAL DATA:  Acute abdominal pain. History of cirrhosis and ascites. EXAM: CT ABDOMEN AND PELVIS WITH CONTRAST TECHNIQUE: Multidetector CT imaging of the abdomen and pelvis was performed using the standard protocol following bolus administration of intravenous contrast. RADIATION DOSE REDUCTION: This exam was performed according to the departmental dose-optimization program which includes automated exposure control, adjustment of the mA and/or kV according to patient size and/or use of iterative reconstruction technique. CONTRAST:  75mL OMNIPAQUE IOHEXOL 350 MG/ML SOLN COMPARISON:  CT abdomen and pelvis 09/01/2021 FINDINGS: Lower chest: No acute abnormality. Hepatobiliary: Gallstones are present. Mild nodular liver contour persists. No focal liver lesions are seen. Gallstones are present. There is no biliary ductal dilatation. Pancreas: Unremarkable. No pancreatic ductal dilatation or surrounding inflammatory changes. Spleen: Borderline enlarged, unchanged. Adrenals/Urinary Tract: Adrenal glands are unremarkable.  Kidneys are normal, without renal calculi, focal lesion, or hydronephrosis. Bladder is unremarkable. Stomach/Bowel: There is diffuse gaseous distention of the colon without dilatation. There is no focal wall thickening or inflammation. The appendix, small bowel and stomach are within normal limits. Vascular/Lymphatic: No significant vascular findings are present. No enlarged abdominal or pelvic lymph nodes. Reproductive: Prostate is unremarkable. Other: No abdominal wall hernia or abnormality. No abdominopelvic ascites. Musculoskeletal: No acute or significant osseous findings. IMPRESSION: 1. No acute localizing process in the abdomen or pelvis. 2. Stable findings of cirrhosis and portal hypertension. 3. Cholelithiasis. Electronically Signed   By: Darliss Cheney M.D.   On: 11/05/2021 18:08    DG Chest 2 View   Result Date: 11/05/2021 CLINICAL DATA:  Altered mental status EXAM: CHEST - 2 VIEW COMPARISON:  07/19/2021 FINDINGS: Transverse diameter of heart is increased. There are no signs of pulmonary edema or focal pulmonary consolidation. There is no pleural effusion or pneumothorax. IMPRESSION: There are no focal infiltrates or signs of pulmonary edema. Electronically Signed   By: Ernie Avena M.D.   On: 11/05/2021 13:27     The ASCVD Risk score (Arnett DK, et al., 2019) failed to calculate for the following reasons:   Cannot find a previous HDL lab   Cannot find a previous total cholesterol lab    Assessment & Plan:   Problem List Items Addressed This Visit   None No follow-ups on file.  Extra time needed for Spanish language interpretation and assessment of high risk conditions complex high Shan Levans, MD

## 2022-07-13 ENCOUNTER — Encounter: Payer: Self-pay | Admitting: Critical Care Medicine

## 2022-07-13 ENCOUNTER — Ambulatory Visit: Payer: Self-pay | Attending: Critical Care Medicine | Admitting: Critical Care Medicine

## 2022-07-13 ENCOUNTER — Other Ambulatory Visit: Payer: Self-pay

## 2022-07-13 VITALS — BP 138/73 | HR 53 | Wt 161.4 lb

## 2022-07-13 DIAGNOSIS — E8809 Other disorders of plasma-protein metabolism, not elsewhere classified: Secondary | ICD-10-CM

## 2022-07-13 DIAGNOSIS — F1090 Alcohol use, unspecified, uncomplicated: Secondary | ICD-10-CM

## 2022-07-13 DIAGNOSIS — K7290 Hepatic failure, unspecified without coma: Secondary | ICD-10-CM

## 2022-07-13 DIAGNOSIS — K7031 Alcoholic cirrhosis of liver with ascites: Secondary | ICD-10-CM

## 2022-07-13 DIAGNOSIS — D696 Thrombocytopenia, unspecified: Secondary | ICD-10-CM

## 2022-07-13 DIAGNOSIS — K7460 Unspecified cirrhosis of liver: Secondary | ICD-10-CM

## 2022-07-13 DIAGNOSIS — E876 Hypokalemia: Secondary | ICD-10-CM

## 2022-07-13 DIAGNOSIS — K7011 Alcoholic hepatitis with ascites: Secondary | ICD-10-CM

## 2022-07-13 DIAGNOSIS — K652 Spontaneous bacterial peritonitis: Secondary | ICD-10-CM

## 2022-07-13 MED ORDER — SPIRONOLACTONE 50 MG PO TABS
50.0000 mg | ORAL_TABLET | Freq: Every day | ORAL | 3 refills | Status: DC
  Filled 2022-07-13: qty 90, 90d supply, fill #0

## 2022-07-13 MED ORDER — PANTOPRAZOLE SODIUM 40 MG PO TBEC
40.0000 mg | DELAYED_RELEASE_TABLET | Freq: Every day | ORAL | 1 refills | Status: DC
  Filled 2022-07-13: qty 90, 90d supply, fill #0

## 2022-07-13 MED ORDER — SODIUM BICARBONATE 650 MG PO TABS
650.0000 mg | ORAL_TABLET | Freq: Two times a day (BID) | ORAL | 1 refills | Status: DC
  Filled 2022-07-13: qty 120, 60d supply, fill #0

## 2022-07-13 MED ORDER — MIDODRINE HCL 10 MG PO TABS
10.0000 mg | ORAL_TABLET | Freq: Three times a day (TID) | ORAL | 3 refills | Status: DC
  Filled 2022-07-13: qty 90, 30d supply, fill #0

## 2022-07-13 MED ORDER — FOLIC ACID 1 MG PO TABS
1.0000 mg | ORAL_TABLET | Freq: Every day | ORAL | 1 refills | Status: DC
  Filled 2022-07-13: qty 60, 60d supply, fill #0

## 2022-07-13 MED ORDER — SULFAMETHOXAZOLE-TRIMETHOPRIM 800-160 MG PO TABS
1.0000 | ORAL_TABLET | Freq: Every day | ORAL | 2 refills | Status: DC
  Filled 2022-07-13: qty 60, 60d supply, fill #0

## 2022-07-13 MED ORDER — FERROUS SULFATE 325 (65 FE) MG PO TABS
325.0000 mg | ORAL_TABLET | Freq: Every day | ORAL | 3 refills | Status: DC
  Filled 2022-07-13: qty 60, 60d supply, fill #0

## 2022-07-13 MED ORDER — PROPRANOLOL HCL 10 MG PO TABS
10.0000 mg | ORAL_TABLET | Freq: Two times a day (BID) | ORAL | 2 refills | Status: DC
  Filled 2022-07-13: qty 120, 60d supply, fill #0

## 2022-07-13 MED ORDER — LACTULOSE 10 GM/15ML PO SOLN
30.0000 g | Freq: Four times a day (QID) | ORAL | 1 refills | Status: DC
  Filled 2022-07-13: qty 473, 3d supply, fill #0

## 2022-07-13 MED ORDER — FUROSEMIDE 20 MG PO TABS
20.0000 mg | ORAL_TABLET | Freq: Every day | ORAL | 3 refills | Status: DC
  Filled 2022-07-13: qty 90, 90d supply, fill #0

## 2022-07-13 NOTE — Assessment & Plan Note (Signed)
No longer drinking appears to be coming more compensated with cirrhosis and portal hypertension  Has follow-up with gastroenterology

## 2022-07-13 NOTE — Assessment & Plan Note (Signed)
Less ascites seen continue diuretic therapy

## 2022-07-13 NOTE — Assessment & Plan Note (Signed)
Improved with therapy will stay on lifelong Bactrim need to confirm he is taking this medicine told him to bring all his medicines and at the next visit for true medication reconciliation

## 2022-07-13 NOTE — Assessment & Plan Note (Signed)
States he is no longer using alcohol

## 2022-07-13 NOTE — Progress Notes (Signed)
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 male. History of alcoholic cirrhosis. Presented to ED with abdominal pain found to have ascites. Request is for therapeutic and diagnostic paracentesis EXAM: ULTRASOUND GUIDED THERAPEUTIC AND DIAGNOSTIC PARACENTESIS MEDICATIONS: Lidocaine 1% 10 mL COMPLICATIONS: None immediate. PROCEDURE: Informed written consent was obtained  from the patient after a discussion of the risks, benefits and alternatives to treatment. A timeout was performed prior to the initiation of the procedure. Initial ultrasound scanning demonstrates a small amount of ascites within the right lower abdominal quadrant. The right lower abdomen was prepped and draped in the usual sterile fashion. 1% lidocaine was used for local anesthesia. Following this, a 6 Fr Safe-T-Centesis catheter was introduced. An ultrasound image was saved for documentation purposes. The paracentesis was performed. The catheter was removed and a dressing was applied. The patient tolerated the procedure well without  immediate post procedural complication. FINDINGS: A total of approximately 800 mL of straw-colored fluid was removed. Samples were sent to the laboratory as requested by the clinical team. IMPRESSION: Successful ultrasound-guided paracentesis yielding 800 mL liters of peritoneal fluid. Read by: Anders Grant, NP PLAN: If the patient eventually requires >/=2 paracenteses in a 30 day period, candidacy for formal evaluation by the [facility] will be assessed. Electronically Signed   By: Malachy Moan M.D.   On: 11/16/2021 13:52    MR ABDOMEN MRCP W WO CONTAST   Result Date: 11/16/2021 CLINICAL DATA:  Jaundice. Alcohol induced cirrhosis. Presents with abdominal pain. Evaluate for common bile duct stones. EXAM: MRI ABDOMEN WITHOUT AND WITH CONTRAST (INCLUDING MRCP) TECHNIQUE: Multiplanar multisequence MR imaging of the abdomen was performed both before and after the administration of intravenous contrast. Heavily T2-weighted images of the biliary and pancreatic ducts were obtained, and three-dimensional MRCP images were rendered by post processing. CONTRAST:  7mL GADAVIST GADOBUTROL 1 MMOL/ML IV SOLN COMPARISON:  11/15/2021 FINDINGS: Lower chest: There is a small to moderate left pleural effusion. Hepatobiliary: The liver appears shrunken with a diffuse nodular contour compatible with advanced cirrhosis. On the arterial phase images there are no focal enhancing liver lesions identified. No abnormal areas of washout noted on the delayed images. Multiple stones are identified within the gallbladder which measure up to 8 mm as seen on the CT from the prior day. There is diffuse gallbladder wall edema which in the setting of cirrhosis, ascites and portal venous hypertension is nonspecific. The gallbladder wall measures up to 7 mm in thickness, image 19/3. Motion artifact limits assessment of the common bile duct. The common bile duct has a normal caliber  measuring 3 mm. No intrahepatic bile duct dilatation. Within the limitation of motion artifact, there are no signs to suggest choledocholithiasis. Pancreas: No mass, inflammatory changes, or other parenchymal abnormality identified. Spleen: Measures 13.8 x 5.7 by 15.7 cm (volume = 650 cm^3). No suspicious splenic lesion. Adrenals/Urinary Tract: Normal adrenal glands. No hydronephrosis or kidney mass identified. Stomach/Bowel: Visualized portions within the abdomen are unremarkable. There is wall thickening involving the ascending colon which may reflect segmental colitis or hepatic colopathy. No pathologic dilatation of the large or small bowel loops. Vascular/Lymphatic: Normal appearance of the abdominal aorta. Upper abdominal varicosities are identified. Nonocclusive thrombus eccentric mural thrombus identified within the superior mesenteric vein, image 79/1302. The splenic vein and portal vein remain patent. There is diffuse venous congestion identified within the central mesentery. No significant adenopathy identified. Other:  There is a large volume of abdominopelvic ascites. Musculoskeletal: IMPRESSION: 1. Morphologic features of the liver compatible with advanced cirrhosis. No suspicious enhancing liver lesions identified. 2. Stigmata of portal venous hypertension identified including varicosities, splenomegaly and ascites. 3. Gallstones. There is diffuse gallbladder wall edema which in the setting of cirrhosis, ascites and portal venous hypertension is nonspecific. 4. No signs of bile  duct dilatation or choledocholithiasis. 5. Nonocclusive thrombus eccentric mural thrombus within the superior mesenteric vein. No thrombus identified within the portal vein or splenic vein. 6. Small to moderate left pleural effusion. 7. Wall thickening involving the ascending colon which may reflect segmental colitis or hepatic colopathy. Electronically Signed   By: Signa Kell M.D.   On: 11/16/2021 11:51    MR 3D Recon At  Scanner   Result Date: 11/16/2021 CLINICAL DATA:  Jaundice. Alcohol induced cirrhosis. Presents with abdominal pain. Evaluate for common bile duct stones. EXAM: MRI ABDOMEN WITHOUT AND WITH CONTRAST (INCLUDING MRCP) TECHNIQUE: Multiplanar multisequence MR imaging of the abdomen was performed both before and after the administration of intravenous contrast. Heavily T2-weighted images of the biliary and pancreatic ducts were obtained, and three-dimensional MRCP images were rendered by post processing. CONTRAST:  7mL GADAVIST GADOBUTROL 1 MMOL/ML IV SOLN COMPARISON:  11/15/2021 FINDINGS: Lower chest: There is a small to moderate left pleural effusion. Hepatobiliary: The liver appears shrunken with a diffuse nodular contour compatible with advanced cirrhosis. On the arterial phase images there are no focal enhancing liver lesions identified. No abnormal areas of washout noted on the delayed images. Multiple stones are identified within the gallbladder which measure up to 8 mm as seen on the CT from the prior day. There is diffuse gallbladder wall edema which in the setting of cirrhosis, ascites and portal venous hypertension is nonspecific. The gallbladder wall measures up to 7 mm in thickness, image 19/3. Motion artifact limits assessment of the common bile duct. The common bile duct has a normal caliber measuring 3 mm. No intrahepatic bile duct dilatation. Within the limitation of motion artifact, there are no signs to suggest choledocholithiasis. Pancreas: No mass, inflammatory changes, or other parenchymal abnormality identified. Spleen: Measures 13.8 x 5.7 by 15.7 cm (volume = 650 cm^3). No suspicious splenic lesion. Adrenals/Urinary Tract: Normal adrenal glands. No hydronephrosis or kidney mass identified. Stomach/Bowel: Visualized portions within the abdomen are unremarkable. There is wall thickening involving the ascending colon which may reflect segmental colitis or hepatic colopathy. No pathologic dilatation  of the large or small bowel loops. Vascular/Lymphatic: Normal appearance of the abdominal aorta. Upper abdominal varicosities are identified. Nonocclusive thrombus eccentric mural thrombus identified within the superior mesenteric vein, image 79/1302. The splenic vein and portal vein remain patent. There is diffuse venous congestion identified within the central mesentery. No significant adenopathy identified. Other:  There is a large volume of abdominopelvic ascites. Musculoskeletal: IMPRESSION: 1. Morphologic features of the liver compatible with advanced cirrhosis. No suspicious enhancing liver lesions identified. 2. Stigmata of portal venous hypertension identified including varicosities, splenomegaly and ascites. 3. Gallstones. There is diffuse gallbladder wall edema which in the setting of cirrhosis, ascites and portal venous hypertension is nonspecific. 4. No signs of bile duct dilatation or choledocholithiasis. 5. Nonocclusive thrombus eccentric mural thrombus within the superior mesenteric vein. No thrombus identified within the portal vein or splenic vein. 6. Small to moderate left pleural effusion. 7. Wall thickening involving the ascending colon which may reflect segmental colitis or hepatic colopathy. Electronically Signed   By: Signa Kell M.D.   On: 11/16/2021 11:51    CT ABDOMEN PELVIS W CONTRAST   Result Date: 11/15/2021 CLINICAL DATA:  Flank pain elevated bilirubin EXAM: CT ABDOMEN AND PELVIS WITH CONTRAST TECHNIQUE: Multidetector CT imaging of the abdomen and pelvis was performed using the standard protocol following bolus administration of intravenous contrast. RADIATION DOSE REDUCTION: This exam was performed according to the departmental dose-optimization program which  includes automated exposure control, adjustment of the mA and/or kV according to patient size and/or use of iterative reconstruction technique. CONTRAST:  75mL OMNIPAQUE IOHEXOL 350 MG/ML SOLN COMPARISON:  CT 11/05/2021,  09/01/2021 FINDINGS: Lower chest: Lung bases demonstrate interval small left effusion with passive atelectasis at the left base. Hepatobiliary: Liver cirrhosis. Contracted gallbladder with multiple stones. No intra hepatic or extrahepatic biliary dilatation. Questionable small ductal stones, coronal series 6 image 59 Pancreas: Unremarkable. No pancreatic ductal dilatation or surrounding inflammatory changes. Spleen: Enlarged, craniocaudal measurement of 16.2 cm. Adrenals/Urinary Tract: Adrenal glands are normal. Kidneys show no hydronephrosis. Slightly thick-walled urinary bladder Stomach/Bowel: The stomach is moderately enlarged. There is no dilated small bowel. Fluid in the colon. There is possible wall thickening of the right and proximal transverse colon. Vascular/Lymphatic: Nonaneurysmal aorta.  No suspicious lymph node Reproductive: Prostate is unremarkable. Other: No free air. Interim development of at least moderate volume abdominopelvic ascites. Small umbilical region hernia containing fluid. Musculoskeletal: No acute osseous abnormality IMPRESSION: 1. Liver cirrhosis. Interim development of at least moderate volume abdominopelvic ascites. Splenomegaly. 2. Contracted gallbladder with multiple stones. Questionable small stones within the common bile duct. No intra or extrahepatic biliary dilatation. 3. Fluid in the colon. Possible wall thickening of the right and proximal transverse colon which may be secondary to colitis or portal colopathy. 4. Interval small left effusion with passive atelectasis at the left base. 5. Slightly thick-walled appearance of the urinary bladder, correlate for cystitis. Electronically Signed   By: Jasmine Pang M.D.   On: 11/15/2021 19:24    DG Chest 2 View   Result Date: 11/11/2021 CLINICAL DATA:  Provided history: Chest pain. Additional history provided: Shortness of breath, history of cirrhosis. EXAM: CHEST - 2 VIEW COMPARISON:  Prior chest radiographs 11/05/2021 and  earlier. FINDINGS: Heart size within normal limits. No appreciable airspace consolidation. No evidence of pleural effusion or pneumothorax. No acute bony abnormality identified. IMPRESSION: No evidence of active cardiopulmonary disease. Electronically Signed   By: Jackey Loge D.O.   On: 11/11/2021 13:11    DG ELBOW COMPLETE LEFT (3+VIEW)   Result Date: 11/07/2021 CLINICAL DATA:  Pain after fall this morning. EXAM: LEFT ELBOW - COMPLETE 3+ VIEW COMPARISON:  None Available. FINDINGS: There is no evidence of fracture, dislocation, or joint effusion. There is no evidence of arthropathy or other focal bone abnormality. Soft tissues are unremarkable. IMPRESSION: Negative. Electronically Signed   By: Gerome Sam III M.D.   On: 11/07/2021 09:58    ECHOCARDIOGRAM COMPLETE   Result Date: 11/06/2021    ECHOCARDIOGRAM REPORT   Patient Name:   WAYLAND NUDELMAN CORDOBA Date of Exam: 11/06/2021 Medical Rec #:  [id number]            Height:       65.0 in Accession #:    [id number]           Weight:       183.7 lb Date of Birth:  January 12, 1980            BSA:          1.908 m Patient Age:    41 years             BP:           110/58 mmHg Patient Gender: M                    HR:           85 bpm. Exam  Location:  Inpatient Procedure: 2D Echo, Cardiac Doppler and Color Doppler Indications:    R07.9 Chest Pain  History:        Patient has prior history of Echocardiogram examinations, most                 recent 06/17/2021. Tobacco and ETOH abuse, Cirrhosis of liver.  Sonographer:    Celesta Gentile RCS Referring Phys: Effie Shy [place] [facility] IMPRESSIONS  1. Left ventricular ejection fraction, by estimation, is 65 to 70%. The left ventricle has normal function. The left ventricle has no regional wall motion abnormalities. There is mild concentric left ventricular hypertrophy. Left ventricular diastolic parameters are consistent with Grade I diastolic dysfunction (impaired relaxation).  2. Right ventricular systolic function is  hyperdynamic. The right ventricular size is normal. There is normal pulmonary artery systolic pressure.  3. Left atrial size was mildly dilated.  4. The mitral valve is normal in structure. No evidence of mitral valve regurgitation. No evidence of mitral stenosis.  5. The aortic valve is tricuspid. Aortic valve regurgitation is not visualized. No aortic stenosis is present.  6. The inferior vena cava is normal in size with greater than 50% respiratory variability, suggesting right atrial pressure of 3 mmHg. Comparison(s): No significant change from prior study. FINDINGS  Left Ventricle: Left ventricular ejection fraction, by estimation, is 65 to 70%. The left ventricle has normal function. The left ventricle has no regional wall motion abnormalities. The left ventricular internal cavity size was normal in size. There is  mild concentric left ventricular hypertrophy. Left ventricular diastolic parameters are consistent with Grade I diastolic dysfunction (impaired relaxation). Right Ventricle: The right ventricular size is normal. No increase in right ventricular wall thickness. Right ventricular systolic function is hyperdynamic. There is normal pulmonary artery systolic pressure. The tricuspid regurgitant velocity is 2.30 m/s, and with an assumed right atrial pressure of 3 mmHg, the estimated right ventricular systolic pressure is 24.2 mmHg. Left Atrium: Left atrial size was mildly dilated. Right Atrium: Right atrial size was normal in size. Pericardium: There is no evidence of pericardial effusion. Mitral Valve: The mitral valve is normal in structure. No evidence of mitral valve regurgitation. No evidence of mitral valve stenosis. Tricuspid Valve: The tricuspid valve is normal in structure. Tricuspid valve regurgitation is mild . No evidence of tricuspid stenosis. Aortic Valve: The aortic valve is tricuspid. Aortic valve regurgitation is not visualized. No aortic stenosis is present. Aortic valve mean gradient  measures 7.0 mmHg. Aortic valve peak gradient measures 14.4 mmHg. Aortic valve area, by VTI measures 2.11  cm. Pulmonic Valve: The pulmonic valve was not well visualized. Pulmonic valve regurgitation is not visualized. Aorta: The aortic root is normal in size and structure and the ascending aorta was not well visualized. Venous: The inferior vena cava is normal in size with greater than 50% respiratory variability, suggesting right atrial pressure of 3 mmHg. IAS/Shunts: No atrial level shunt detected by color flow Doppler.  LEFT VENTRICLE PLAX 2D LVIDd:         4.60 cm   Diastology LVIDs:         2.50 cm   LV e' medial:    8.05 cm/s LV PW:         1.20 cm   LV E/e' medial:  7.3 LV IVS:        1.20 cm   LV e' lateral:   9.14 cm/s LVOT diam:     1.90 cm   LV E/e' lateral: 6.5  LV SV:         73 LV SV Index:   38 LVOT Area:     2.84 cm  RIGHT VENTRICLE RV S prime:     23.50 cm/s TAPSE (M-mode): 2.4 cm LEFT ATRIUM             Index        RIGHT ATRIUM           Index LA diam:        4.70 cm 2.46 cm/m   RA Area:     20.30 cm LA Vol (A2C):   55.8 ml 29.24 ml/m  RA Volume:   58.90 ml  30.87 ml/m LA Vol (A4C):   70.3 ml 36.84 ml/m LA Biplane Vol: 62.9 ml 32.97 ml/m  AORTIC VALVE AV Area (Vmax):    1.72 cm AV Area (Vmean):   2.00 cm AV Area (VTI):     2.11 cm AV Vmax:           190.00 cm/s AV Vmean:          117.500 cm/s AV VTI:            0.349 m AV Peak Grad:      14.4 mmHg AV Mean Grad:      7.0 mmHg LVOT Vmax:         115.00 cm/s LVOT Vmean:        82.900 cm/s LVOT VTI:          0.259 m LVOT/AV VTI ratio: 0.74  AORTA Ao Root diam: 3.10 cm MITRAL VALVE               TRICUSPID VALVE MV Area (PHT): 2.66 cm    TR Peak grad:   21.2 mmHg MV Decel Time: 285 msec    TR Vmax:        230.00 cm/s MV E velocity: 59.10 cm/s MV A velocity: 75.20 cm/s  SHUNTS MV E/A ratio:  0.79        Systemic VTI:  0.26 m                            Systemic Diam: 1.90 cm Riley Lam MD Electronically signed by Riley Lam  MD Signature Date/Time: 11/06/2021/3:59:57 PM    Final     [place] ASCITES (ABDOMEN LIMITED)   Result Date: 11/06/2021 CLINICAL DATA:  Assess ascites. EXAM: LIMITED ABDOMEN ULTRASOUND FOR ASCITES TECHNIQUE: Limited ultrasound survey for ascites was performed in all four abdominal quadrants. COMPARISON:  None Available. FINDINGS: A small amount of ascites is identified throughout the abdomen. No drainable collection of ascites is noted. IMPRESSION: Small amount of ascites. The amount of ascites is not amenable to paracentesis. Electronically Signed   By: Gerome Sam III M.D.   On: 11/06/2021 10:13    CT Head Wo Contrast   Result Date: 11/05/2021 CLINICAL DATA:  Cirrhosis, altered level of consciousness, confusion EXAM: CT HEAD WITHOUT CONTRAST TECHNIQUE: Contiguous axial images were obtained from the base of the skull through the vertex without intravenous contrast. RADIATION DOSE REDUCTION: This exam was performed according to the departmental dose-optimization program which includes automated exposure control, adjustment of the mA and/or kV according to patient size and/or use of iterative reconstruction technique. COMPARISON:  06/30/2021 FINDINGS: Brain: No acute infarct or hemorrhage. Lateral ventricles and midline structures are unremarkable. No acute extra-axial fluid collections. No mass effect. Vascular: No hyperdense vessel or unexpected calcification. Skull: Normal.  Negative for fracture or focal lesion. Sinuses/Orbits: No acute finding. Other: None. IMPRESSION: 1. No acute intracranial process. Electronically Signed   By: Sharlet Salina M.D.   On: 11/05/2021 18:08    CT ABDOMEN PELVIS W CONTRAST   Result Date: 11/05/2021 CLINICAL DATA:  Acute abdominal pain. History of cirrhosis and ascites. EXAM: CT ABDOMEN AND PELVIS WITH CONTRAST TECHNIQUE: Multidetector CT imaging of the abdomen and pelvis was performed using the standard protocol following bolus administration of intravenous contrast.  RADIATION DOSE REDUCTION: This exam was performed according to the departmental dose-optimization program which includes automated exposure control, adjustment of the mA and/or kV according to patient size and/or use of iterative reconstruction technique. CONTRAST:  75mL OMNIPAQUE IOHEXOL 350 MG/ML SOLN COMPARISON:  CT abdomen and pelvis 09/01/2021 FINDINGS: Lower chest: No acute abnormality. Hepatobiliary: Gallstones are present. Mild nodular liver contour persists. No focal liver lesions are seen. Gallstones are present. There is no biliary ductal dilatation. Pancreas: Unremarkable. No pancreatic ductal dilatation or surrounding inflammatory changes. Spleen: Borderline enlarged, unchanged. Adrenals/Urinary Tract: Adrenal glands are unremarkable. Kidneys are normal, without renal calculi, focal lesion, or hydronephrosis. Bladder is unremarkable. Stomach/Bowel: There is diffuse gaseous distention of the colon without dilatation. There is no focal wall thickening or inflammation. The appendix, small bowel and stomach are within normal limits. Vascular/Lymphatic: No significant vascular findings are present. No enlarged abdominal or pelvic lymph nodes. Reproductive: Prostate is unremarkable. Other: No abdominal wall hernia or abnormality. No abdominopelvic ascites. Musculoskeletal: No acute or significant osseous findings. IMPRESSION: 1. No acute localizing process in the abdomen or pelvis. 2. Stable findings of cirrhosis and portal hypertension. 3. Cholelithiasis. Electronically Signed   By: Darliss Cheney M.D.   On: 11/05/2021 18:08    DG Chest 2 View   Result Date: 11/05/2021 CLINICAL DATA:  Altered mental status EXAM: CHEST - 2 VIEW COMPARISON:  07/19/2021 FINDINGS: Transverse diameter of heart is increased. There are no signs of pulmonary edema or focal pulmonary consolidation. There is no pleural effusion or pneumothorax. IMPRESSION: There are no focal infiltrates or signs of pulmonary edema. Electronically  Signed   By: Ernie Avena M.D.   On: 11/05/2021 13:27     The ASCVD Risk score (Arnett DK, et al., 2019) failed to calculate for the following reasons:   Cannot find a previous HDL lab   Cannot find a previous total cholesterol lab    Assessment & Plan:   Problem List Items Addressed This Visit       Digestive   Alcoholic cirrhosis of liver with ascites (HCC) - Primary    Less ascites seen continue diuretic therapy      Relevant Orders   Comprehensive metabolic panel   Decompensated hepatic cirrhosis (HCC)    No longer drinking appears to be coming more compensated with cirrhosis and portal hypertension  Has follow-up with gastroenterology      Alcoholic hepatitis    Follow-up liver enzymes        Hematopoietic and Hemostatic   Thrombocytopenia (HCC) (Chronic)    Follow-up CBC        Other   Hypokalemia    Assess labs      Edema due to hypoalbuminemia    Improved with diuresis      SBP (spontaneous bacterial peritonitis) (HCC) s/t Decompensated Hepatic Cirrhosis    As per spontaneous bacterial peritonitis assessment      Relevant Medications   sulfamethoxazole-trimethoprim (BACTRIM DS) 800-160 MG tablet   Other Relevant Orders  CBC with Differential/Platelet   Alcohol use disorder    States he is no longer using alcohol      Spontaneous bacterial peritonitis (HCC)    Improved with therapy will stay on lifelong Bactrim need to confirm he is taking this medicine told him to bring all his medicines and at the next visit for true medication reconciliation      Relevant Medications   sulfamethoxazole-trimethoprim (BACTRIM DS) 800-160 MG tablet  Return in about 1 month (around 08/13/2022) for Alcohol use, chronic conditions, alcohol cirrhosis.  Extra time needed for Spanish language interpretation and assessment of high risk conditions complex high Shan Levans, MD

## 2022-07-13 NOTE — Assessment & Plan Note (Signed)
As per spontaneous bacterial peritonitis assessment

## 2022-07-13 NOTE — Assessment & Plan Note (Signed)
Assess labs

## 2022-07-13 NOTE — Assessment & Plan Note (Signed)
Improved with diuresis 

## 2022-07-13 NOTE — Assessment & Plan Note (Signed)
Follow-up CBC ?

## 2022-07-13 NOTE — Patient Instructions (Signed)
Labs today Future refills sent to our pharmacy downstairs Return 1 month, bring pill bottles with you

## 2022-07-13 NOTE — Assessment & Plan Note (Signed)
Follow-up liver enzymes °

## 2022-07-14 LAB — CBC WITH DIFFERENTIAL/PLATELET
Basophils Absolute: 0 10*3/uL (ref 0.0–0.2)
Basos: 1 %
EOS (ABSOLUTE): 0.1 10*3/uL (ref 0.0–0.4)
Eos: 5 %
Hematocrit: 22.8 % — ABNORMAL LOW (ref 37.5–51.0)
Hemoglobin: 8.4 g/dL — ABNORMAL LOW (ref 13.0–17.7)
Immature Grans (Abs): 0 10*3/uL (ref 0.0–0.1)
Immature Granulocytes: 0 %
Lymphocytes Absolute: 0.7 10*3/uL (ref 0.7–3.1)
Lymphs: 34 %
MCH: 35 pg — ABNORMAL HIGH (ref 26.6–33.0)
MCHC: 36.8 g/dL — ABNORMAL HIGH (ref 31.5–35.7)
MCV: 95 fL (ref 79–97)
Monocytes Absolute: 0.4 10*3/uL (ref 0.1–0.9)
Monocytes: 20 %
Neutrophils Absolute: 0.8 10*3/uL — ABNORMAL LOW (ref 1.4–7.0)
Neutrophils: 40 %
Platelets: 52 10*3/uL — CL (ref 150–450)
RBC: 2.4 x10E6/uL — CL (ref 4.14–5.80)
RDW: 12.3 % (ref 11.6–15.4)
WBC: 1.9 10*3/uL — CL (ref 3.4–10.8)

## 2022-07-14 LAB — COMPREHENSIVE METABOLIC PANEL
ALT: 15 IU/L (ref 0–44)
AST: 30 IU/L (ref 0–40)
Albumin: 4.2 g/dL (ref 4.1–5.1)
Alkaline Phosphatase: 127 IU/L — ABNORMAL HIGH (ref 44–121)
BUN/Creatinine Ratio: 13 (ref 9–20)
BUN: 13 mg/dL (ref 6–24)
Bilirubin Total: 4.6 mg/dL — ABNORMAL HIGH (ref 0.0–1.2)
CO2: 16 mmol/L — ABNORMAL LOW (ref 20–29)
Calcium: 9.2 mg/dL (ref 8.7–10.2)
Chloride: 107 mmol/L — ABNORMAL HIGH (ref 96–106)
Creatinine, Ser: 0.97 mg/dL (ref 0.76–1.27)
Globulin, Total: 2.3 g/dL (ref 1.5–4.5)
Glucose: 103 mg/dL — ABNORMAL HIGH (ref 70–99)
Potassium: 4.8 mmol/L (ref 3.5–5.2)
Sodium: 136 mmol/L (ref 134–144)
Total Protein: 6.5 g/dL (ref 6.0–8.5)
eGFR: 99 mL/min/{1.73_m2} (ref >59)

## 2022-07-16 NOTE — Progress Notes (Signed)
Let pt know labs stable no medication changes

## 2022-07-18 ENCOUNTER — Telehealth: Payer: Self-pay

## 2022-07-18 NOTE — Telephone Encounter (Signed)
-----   Message from [name], MD sent at 07/16/2022  7:15 AM EDT ----- Let pt know labs stable no medication changes

## 2022-07-18 NOTE — Telephone Encounter (Signed)
Called both number on file unable to make contact numbers out of service     Interpreter id [phone]

## 2022-07-19 ENCOUNTER — Other Ambulatory Visit: Payer: Self-pay

## 2022-08-09 ENCOUNTER — Other Ambulatory Visit: Payer: Self-pay

## 2022-08-17 ENCOUNTER — Encounter: Payer: Self-pay | Admitting: Critical Care Medicine

## 2022-08-17 ENCOUNTER — Ambulatory Visit: Payer: Self-pay | Attending: Critical Care Medicine | Admitting: Critical Care Medicine

## 2022-08-17 ENCOUNTER — Other Ambulatory Visit: Payer: Self-pay

## 2022-08-17 VITALS — BP 117/71 | HR 70 | Temp 98.4°F | Wt 169.0 lb

## 2022-08-17 DIAGNOSIS — K7031 Alcoholic cirrhosis of liver with ascites: Secondary | ICD-10-CM

## 2022-08-17 DIAGNOSIS — D61818 Other pancytopenia: Secondary | ICD-10-CM

## 2022-08-17 DIAGNOSIS — K652 Spontaneous bacterial peritonitis: Secondary | ICD-10-CM

## 2022-08-17 DIAGNOSIS — E876 Hypokalemia: Secondary | ICD-10-CM

## 2022-08-17 DIAGNOSIS — D696 Thrombocytopenia, unspecified: Secondary | ICD-10-CM

## 2022-08-17 MED ORDER — SODIUM BICARBONATE 650 MG PO TABS
650.0000 mg | ORAL_TABLET | Freq: Two times a day (BID) | ORAL | 1 refills | Status: DC
  Filled 2022-08-17: qty 60, 30d supply, fill #0

## 2022-08-17 MED ORDER — FUROSEMIDE 20 MG PO TABS
20.0000 mg | ORAL_TABLET | Freq: Every day | ORAL | 3 refills | Status: DC
  Filled 2022-08-17: qty 30, 30d supply, fill #0

## 2022-08-17 MED ORDER — FERROUS SULFATE 325 (65 FE) MG PO TABS
325.0000 mg | ORAL_TABLET | Freq: Every day | ORAL | 3 refills | Status: DC
  Filled 2022-08-17: qty 30, 30d supply, fill #0
  Filled 2022-11-23: qty 30, 30d supply, fill #1

## 2022-08-17 MED ORDER — FOLIC ACID 1 MG PO TABS
1.0000 mg | ORAL_TABLET | Freq: Every day | ORAL | 1 refills | Status: DC
  Filled 2022-08-17: qty 30, 30d supply, fill #0
  Filled 2022-11-23: qty 30, 30d supply, fill #1

## 2022-08-17 MED ORDER — MIDODRINE HCL 10 MG PO TABS
10.0000 mg | ORAL_TABLET | Freq: Three times a day (TID) | ORAL | 3 refills | Status: DC
  Filled 2022-08-17: qty 90, 30d supply, fill #0

## 2022-08-17 MED ORDER — SULFAMETHOXAZOLE-TRIMETHOPRIM 800-160 MG PO TABS
1.0000 | ORAL_TABLET | Freq: Every day | ORAL | 2 refills | Status: DC
  Filled 2022-08-17: qty 30, 30d supply, fill #0

## 2022-08-17 MED ORDER — PROPRANOLOL HCL 10 MG PO TABS
10.0000 mg | ORAL_TABLET | Freq: Two times a day (BID) | ORAL | 2 refills | Status: DC
  Filled 2022-08-17: qty 60, 30d supply, fill #0

## 2022-08-17 MED ORDER — SPIRONOLACTONE 50 MG PO TABS
50.0000 mg | ORAL_TABLET | Freq: Every day | ORAL | 3 refills | Status: DC
  Filled 2022-08-17: qty 30, 30d supply, fill #0

## 2022-08-17 MED ORDER — LACTULOSE 10 GM/15ML PO SOLN
30.0000 g | Freq: Four times a day (QID) | ORAL | 1 refills | Status: DC
  Filled 2022-08-17: qty 473, 3d supply, fill #0

## 2022-08-17 MED ORDER — PANTOPRAZOLE SODIUM 40 MG PO TBEC
40.0000 mg | DELAYED_RELEASE_TABLET | Freq: Every day | ORAL | 1 refills | Status: DC
  Filled 2022-08-17: qty 30, 30d supply, fill #0

## 2022-08-17 NOTE — Progress Notes (Signed)
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     Nails: There is no clubbing.     Comments: Spider angiomas and decreased icterus  Neurological:     Mental Status: He is alert and oriented to person, place, and time.     Sensory: No sensory deficit.  Psychiatric:        Speech:  Speech normal.        Behavior: Behavior normal.      No results found for any visits on 08/17/22.    IR Paracentesis   Result Date: 11/16/2021 INDICATION: 42 year old male. History of alcoholic cirrhosis. Presented to ED with abdominal pain found to have ascites. Request is for therapeutic and diagnostic paracentesis EXAM: ULTRASOUND GUIDED THERAPEUTIC AND DIAGNOSTIC PARACENTESIS MEDICATIONS: Lidocaine 1% 10 mL COMPLICATIONS: None immediate. PROCEDURE: Informed written consent was obtained from the patient after a discussion of the risks, benefits and alternatives to treatment. A timeout was performed prior to the initiation of the procedure. Initial ultrasound scanning demonstrates a small amount of ascites within the right lower abdominal  quadrant. The right lower abdomen was prepped and draped in the usual sterile fashion. 1% lidocaine was used for local anesthesia. Following this, a 6 Fr Safe-T-Centesis catheter was introduced. An ultrasound image was saved for documentation purposes. The paracentesis was performed. The catheter was removed and a dressing was applied. The patient tolerated the procedure well without immediate post procedural complication. FINDINGS: A total of approximately 800 mL of straw-colored fluid was removed. Samples were sent to the laboratory as requested by the clinical team. IMPRESSION: Successful ultrasound-guided paracentesis yielding 800 mL liters of peritoneal fluid. Read by: Anders Grant, NP PLAN: If the patient eventually requires >/=2 paracenteses in a 30 day period, candidacy for formal evaluation by the [facility] will be assessed. Electronically Signed   By: Malachy Moan M.D.   On: 11/16/2021 13:52    MR ABDOMEN MRCP W WO CONTAST   Result Date: 11/16/2021 CLINICAL DATA:  Jaundice. Alcohol induced cirrhosis. Presents with abdominal pain. Evaluate for common bile duct stones. EXAM: MRI ABDOMEN WITHOUT AND WITH CONTRAST (INCLUDING MRCP) TECHNIQUE: Multiplanar multisequence MR imaging of the abdomen was performed both before and after the administration of intravenous contrast. Heavily T2-weighted images of the biliary and pancreatic ducts were obtained, and three-dimensional MRCP images were rendered by post processing. CONTRAST:  7mL GADAVIST GADOBUTROL 1 MMOL/ML IV SOLN COMPARISON:  11/15/2021 FINDINGS: Lower chest: There is a small to moderate left pleural effusion. Hepatobiliary: The liver appears shrunken with a diffuse nodular contour compatible with advanced cirrhosis. On the arterial phase images there are no focal enhancing liver lesions identified. No abnormal areas of washout noted on the delayed images. Multiple stones are identified  within the gallbladder which measure up to 8 mm as seen on the CT from the prior day. There is diffuse gallbladder wall edema which in the setting of cirrhosis, ascites and portal venous hypertension is nonspecific. The gallbladder wall measures up to 7 mm in thickness, image 19/3. Motion artifact limits assessment of the common bile duct. The common bile duct has a normal caliber measuring 3 mm. No intrahepatic bile duct dilatation. Within the limitation of motion artifact, there are no signs to suggest choledocholithiasis. Pancreas: No mass, inflammatory changes, or other parenchymal abnormality identified. Spleen: Measures 13.8 x 5.7 by 15.7 cm (volume = 650 cm^3). No suspicious splenic lesion. Adrenals/Urinary Tract: Normal adrenal glands. No hydronephrosis or kidney mass identified. Stomach/Bowel: Visualized portions within the abdomen are unremarkable. There is wall thickening involving the ascending colon which may reflect segmental colitis or hepatic colopathy. No pathologic dilatation of the large or small bowel loops. Vascular/Lymphatic: Normal appearance of the abdominal aorta. Upper abdominal varicosities are identified. Nonocclusive thrombus eccentric mural thrombus identified within the superior mesenteric vein, image 79/1302. The splenic vein and portal vein remain patent.  There is diffuse venous congestion identified within the central mesentery. No significant adenopathy identified. Other:  There is a large volume of abdominopelvic ascites. Musculoskeletal: IMPRESSION: 1. Morphologic features of the liver compatible with advanced cirrhosis. No suspicious enhancing liver lesions identified. 2. Stigmata of portal venous hypertension identified including varicosities, splenomegaly and ascites. 3. Gallstones. There is diffuse gallbladder wall edema which in the setting of cirrhosis, ascites and portal venous hypertension is nonspecific. 4. No signs of bile duct dilatation or choledocholithiasis. 5.  Nonocclusive thrombus eccentric mural thrombus within the superior mesenteric vein. No thrombus identified within the portal vein or splenic vein. 6. Small to moderate left pleural effusion. 7. Wall thickening involving the ascending colon which may reflect segmental colitis or hepatic colopathy. Electronically Signed   By: Signa Kell M.D.   On: 11/16/2021 11:51    MR 3D Recon At Scanner   Result Date: 11/16/2021 CLINICAL DATA:  Jaundice. Alcohol induced cirrhosis. Presents with abdominal pain. Evaluate for common bile duct stones. EXAM: MRI ABDOMEN WITHOUT AND WITH CONTRAST (INCLUDING MRCP) TECHNIQUE: Multiplanar multisequence MR imaging of the abdomen was performed both before and after the administration of intravenous contrast. Heavily T2-weighted images of the biliary and pancreatic ducts were obtained, and three-dimensional MRCP images were rendered by post processing. CONTRAST:  7mL GADAVIST GADOBUTROL 1 MMOL/ML IV SOLN COMPARISON:  11/15/2021 FINDINGS: Lower chest: There is a small to moderate left pleural effusion. Hepatobiliary: The liver appears shrunken with a diffuse nodular contour compatible with advanced cirrhosis. On the arterial phase images there are no focal enhancing liver lesions identified. No abnormal areas of washout noted on the delayed images. Multiple stones are identified within the gallbladder which measure up to 8 mm as seen on the CT from the prior day. There is diffuse gallbladder wall edema which in the setting of cirrhosis, ascites and portal venous hypertension is nonspecific. The gallbladder wall measures up to 7 mm in thickness, image 19/3. Motion artifact limits assessment of the common bile duct. The common bile duct has a normal caliber measuring 3 mm. No intrahepatic bile duct dilatation. Within the limitation of motion artifact, there are no signs to suggest choledocholithiasis. Pancreas: No mass, inflammatory changes, or other parenchymal abnormality identified.  Spleen: Measures 13.8 x 5.7 by 15.7 cm (volume = 650 cm^3). No suspicious splenic lesion. Adrenals/Urinary Tract: Normal adrenal glands. No hydronephrosis or kidney mass identified. Stomach/Bowel: Visualized portions within the abdomen are unremarkable. There is wall thickening involving the ascending colon which may reflect segmental colitis or hepatic colopathy. No pathologic dilatation of the large or small bowel loops. Vascular/Lymphatic: Normal appearance of the abdominal aorta. Upper abdominal varicosities are identified. Nonocclusive thrombus eccentric mural thrombus identified within the superior mesenteric vein, image 79/1302. The splenic vein and portal vein remain patent. There is diffuse venous congestion identified within the central mesentery. No significant adenopathy identified. Other:  There is a large volume of abdominopelvic ascites. Musculoskeletal: IMPRESSION: 1. Morphologic features of the liver compatible with advanced cirrhosis. No suspicious enhancing liver lesions identified. 2. Stigmata of portal venous hypertension identified including varicosities, splenomegaly and ascites. 3. Gallstones. There is diffuse gallbladder wall edema which in the setting of cirrhosis, ascites and portal venous hypertension is nonspecific. 4. No signs of bile duct dilatation or choledocholithiasis. 5. Nonocclusive thrombus eccentric mural thrombus within the superior mesenteric vein. No thrombus identified within the portal vein or splenic vein. 6. Small to moderate left pleural effusion. 7. Wall thickening involving the ascending colon which may reflect segmental  colitis or hepatic colopathy. Electronically Signed   By: Signa Kell M.D.   On: 11/16/2021 11:51    CT ABDOMEN PELVIS W CONTRAST   Result Date: 11/15/2021 CLINICAL DATA:  Flank pain elevated bilirubin EXAM: CT ABDOMEN AND PELVIS WITH CONTRAST TECHNIQUE: Multidetector CT imaging of the abdomen and pelvis was performed using the standard  protocol following bolus administration of intravenous contrast. RADIATION DOSE REDUCTION: This exam was performed according to the departmental dose-optimization program which includes automated exposure control, adjustment of the mA and/or kV according to patient size and/or use of iterative reconstruction technique. CONTRAST:  75mL OMNIPAQUE IOHEXOL 350 MG/ML SOLN COMPARISON:  CT 11/05/2021, 09/01/2021 FINDINGS: Lower chest: Lung bases demonstrate interval small left effusion with passive atelectasis at the left base. Hepatobiliary: Liver cirrhosis. Contracted gallbladder with multiple stones. No intra hepatic or extrahepatic biliary dilatation. Questionable small ductal stones, coronal series 6 image 59 Pancreas: Unremarkable. No pancreatic ductal dilatation or surrounding inflammatory changes. Spleen: Enlarged, craniocaudal measurement of 16.2 cm. Adrenals/Urinary Tract: Adrenal glands are normal. Kidneys show no hydronephrosis. Slightly thick-walled urinary bladder Stomach/Bowel: The stomach is moderately enlarged. There is no dilated small bowel. Fluid in the colon. There is possible wall thickening of the right and proximal transverse colon. Vascular/Lymphatic: Nonaneurysmal aorta.  No suspicious lymph node Reproductive: Prostate is unremarkable. Other: No free air. Interim development of at least moderate volume abdominopelvic ascites. Small umbilical region hernia containing fluid. Musculoskeletal: No acute osseous abnormality IMPRESSION: 1. Liver cirrhosis. Interim development of at least moderate volume abdominopelvic ascites. Splenomegaly. 2. Contracted gallbladder with multiple stones. Questionable small stones within the common bile duct. No intra or extrahepatic biliary dilatation. 3. Fluid in the colon. Possible wall thickening of the right and proximal transverse colon which may be secondary to colitis or portal colopathy. 4. Interval small left effusion with passive atelectasis at the left base.  5. Slightly thick-walled appearance of the urinary bladder, correlate for cystitis. Electronically Signed   By: Jasmine Pang M.D.   On: 11/15/2021 19:24    DG Chest 2 View   Result Date: 11/11/2021 CLINICAL DATA:  Provided history: Chest pain. Additional history provided: Shortness of breath, history of cirrhosis. EXAM: CHEST - 2 VIEW COMPARISON:  Prior chest radiographs 11/05/2021 and earlier. FINDINGS: Heart size within normal limits. No appreciable airspace consolidation. No evidence of pleural effusion or pneumothorax. No acute bony abnormality identified. IMPRESSION: No evidence of active cardiopulmonary disease. Electronically Signed   By: Jackey Loge D.O.   On: 11/11/2021 13:11    DG ELBOW COMPLETE LEFT (3+VIEW)   Result Date: 11/07/2021 CLINICAL DATA:  Pain after fall this morning. EXAM: LEFT ELBOW - COMPLETE 3+ VIEW COMPARISON:  None Available. FINDINGS: There is no evidence of fracture, dislocation, or joint effusion. There is no evidence of arthropathy or other focal bone abnormality. Soft tissues are unremarkable. IMPRESSION: Negative. Electronically Signed   By: Gerome Sam III M.D.   On: 11/07/2021 09:58    ECHOCARDIOGRAM COMPLETE   Result Date: 11/06/2021    ECHOCARDIOGRAM REPORT   Patient Name:   BENTLEE PACKETT CORDOBA Date of Exam: 11/06/2021 Medical Rec #:  [id number]            Height:       65.0 in Accession #:    [id number]           Weight:       183.7 lb Date of Birth:  1981-01-01  BSA:          1.908 m Patient Age:    41 years             BP:           110/58 mmHg Patient Gender: M                    HR:           85 bpm. Exam Location:  Inpatient Procedure: 2D Echo, Cardiac Doppler and Color Doppler Indications:    R07.9 Chest Pain  History:        Patient has prior history of Echocardiogram examinations, most                 recent 06/17/2021. Tobacco and ETOH abuse, Cirrhosis of liver.  Sonographer:    Celesta Gentile RCS Referring Phys: Effie Shy [place] [facility]  IMPRESSIONS  1. Left ventricular ejection fraction, by estimation, is 65 to 70%. The left ventricle has normal function. The left ventricle has no regional wall motion abnormalities. There is mild concentric left ventricular hypertrophy. Left ventricular diastolic parameters are consistent with Grade I diastolic dysfunction (impaired relaxation).  2. Right ventricular systolic function is hyperdynamic. The right ventricular size is normal. There is normal pulmonary artery systolic pressure.  3. Left atrial size was mildly dilated.  4. The mitral valve is normal in structure. No evidence of mitral valve regurgitation. No evidence of mitral stenosis.  5. The aortic valve is tricuspid. Aortic valve regurgitation is not visualized. No aortic stenosis is present.  6. The inferior vena cava is normal in size with greater than 50% respiratory variability, suggesting right atrial pressure of 3 mmHg. Comparison(s): No significant change from prior study. FINDINGS  Left Ventricle: Left ventricular ejection fraction, by estimation, is 65 to 70%. The left ventricle has normal function. The left ventricle has no regional wall motion abnormalities. The left ventricular internal cavity size was normal in size. There is  mild concentric left ventricular hypertrophy. Left ventricular diastolic parameters are consistent with Grade I diastolic dysfunction (impaired relaxation). Right Ventricle: The right ventricular size is normal. No increase in right ventricular wall thickness. Right ventricular systolic function is hyperdynamic. There is normal pulmonary artery systolic pressure. The tricuspid regurgitant velocity is 2.30 m/s, and with an assumed right atrial pressure of 3 mmHg, the estimated right ventricular systolic pressure is 24.2 mmHg. Left Atrium: Left atrial size was mildly dilated. Right Atrium: Right atrial size was normal in size. Pericardium: There is no evidence of pericardial effusion. Mitral Valve: The mitral valve  is normal in structure. No evidence of mitral valve regurgitation. No evidence of mitral valve stenosis. Tricuspid Valve: The tricuspid valve is normal in structure. Tricuspid valve regurgitation is mild . No evidence of tricuspid stenosis. Aortic Valve: The aortic valve is tricuspid. Aortic valve regurgitation is not visualized. No aortic stenosis is present. Aortic valve mean gradient measures 7.0 mmHg. Aortic valve peak gradient measures 14.4 mmHg. Aortic valve area, by VTI measures 2.11  cm. Pulmonic Valve: The pulmonic valve was not well visualized. Pulmonic valve regurgitation is not visualized. Aorta: The aortic root is normal in size and structure and the ascending aorta was not well visualized. Venous: The inferior vena cava is normal in size with greater than 50% respiratory variability, suggesting right atrial pressure of 3 mmHg. IAS/Shunts: No atrial level shunt detected by color flow Doppler.  LEFT VENTRICLE PLAX 2D LVIDd:  4.60 cm   Diastology LVIDs:         2.50 cm   LV e' medial:    8.05 cm/s LV PW:         1.20 cm   LV E/e' medial:  7.3 LV IVS:        1.20 cm   LV e' lateral:   9.14 cm/s LVOT diam:     1.90 cm   LV E/e' lateral: 6.5 LV SV:         73 LV SV Index:   38 LVOT Area:     2.84 cm  RIGHT VENTRICLE RV S prime:     23.50 cm/s TAPSE (M-mode): 2.4 cm LEFT ATRIUM             Index        RIGHT ATRIUM           Index LA diam:        4.70 cm 2.46 cm/m   RA Area:     20.30 cm LA Vol (A2C):   55.8 ml 29.24 ml/m  RA Volume:   58.90 ml  30.87 ml/m LA Vol (A4C):   70.3 ml 36.84 ml/m LA Biplane Vol: 62.9 ml 32.97 ml/m  AORTIC VALVE AV Area (Vmax):    1.72 cm AV Area (Vmean):   2.00 cm AV Area (VTI):     2.11 cm AV Vmax:           190.00 cm/s AV Vmean:          117.500 cm/s AV VTI:            0.349 m AV Peak Grad:      14.4 mmHg AV Mean Grad:      7.0 mmHg LVOT Vmax:         115.00 cm/s LVOT Vmean:        82.900 cm/s LVOT VTI:          0.259 m LVOT/AV VTI ratio: 0.74  AORTA Ao Root  diam: 3.10 cm MITRAL VALVE               TRICUSPID VALVE MV Area (PHT): 2.66 cm    TR Peak grad:   21.2 mmHg MV Decel Time: 285 msec    TR Vmax:        230.00 cm/s MV E velocity: 59.10 cm/s MV A velocity: 75.20 cm/s  SHUNTS MV E/A ratio:  0.79        Systemic VTI:  0.26 m                            Systemic Diam: 1.90 cm Riley Lam MD Electronically signed by Riley Lam MD Signature Date/Time: 11/06/2021/3:59:57 PM    Final     [place] ASCITES (ABDOMEN LIMITED)   Result Date: 11/06/2021 CLINICAL DATA:  Assess ascites. EXAM: LIMITED ABDOMEN ULTRASOUND FOR ASCITES TECHNIQUE: Limited ultrasound survey for ascites was performed in all four abdominal quadrants. COMPARISON:  None Available. FINDINGS: A small amount of ascites is identified throughout the abdomen. No drainable collection of ascites is noted. IMPRESSION: Small amount of ascites. The amount of ascites is not amenable to paracentesis. Electronically Signed   By: Gerome Sam III M.D.   On: 11/06/2021 10:13    CT Head Wo Contrast   Result Date: 11/05/2021 CLINICAL DATA:  Cirrhosis, altered level of consciousness, confusion EXAM: CT HEAD WITHOUT CONTRAST TECHNIQUE: Contiguous axial images were obtained from  the base of the skull through the vertex without intravenous contrast. RADIATION DOSE REDUCTION: This exam was performed according to the departmental dose-optimization program which includes automated exposure control, adjustment of the mA and/or kV according to patient size and/or use of iterative reconstruction technique. COMPARISON:  06/30/2021 FINDINGS: Brain: No acute infarct or hemorrhage. Lateral ventricles and midline structures are unremarkable. No acute extra-axial fluid collections. No mass effect. Vascular: No hyperdense vessel or unexpected calcification. Skull: Normal. Negative for fracture or focal lesion. Sinuses/Orbits: No acute finding. Other: None. IMPRESSION: 1. No acute intracranial process. Electronically  Signed   By: Sharlet Salina M.D.   On: 11/05/2021 18:08    CT ABDOMEN PELVIS W CONTRAST   Result Date: 11/05/2021 CLINICAL DATA:  Acute abdominal pain. History of cirrhosis and ascites. EXAM: CT ABDOMEN AND PELVIS WITH CONTRAST TECHNIQUE: Multidetector CT imaging of the abdomen and pelvis was performed using the standard protocol following bolus administration of intravenous contrast. RADIATION DOSE REDUCTION: This exam was performed according to the departmental dose-optimization program which includes automated exposure control, adjustment of the mA and/or kV according to patient size and/or use of iterative reconstruction technique. CONTRAST:  75mL OMNIPAQUE IOHEXOL 350 MG/ML SOLN COMPARISON:  CT abdomen and pelvis 09/01/2021 FINDINGS: Lower chest: No acute abnormality. Hepatobiliary: Gallstones are present. Mild nodular liver contour persists. No focal liver lesions are seen. Gallstones are present. There is no biliary ductal dilatation. Pancreas: Unremarkable. No pancreatic ductal dilatation or surrounding inflammatory changes. Spleen: Borderline enlarged, unchanged. Adrenals/Urinary Tract: Adrenal glands are unremarkable. Kidneys are normal, without renal calculi, focal lesion, or hydronephrosis. Bladder is unremarkable. Stomach/Bowel: There is diffuse gaseous distention of the colon without dilatation. There is no focal wall thickening or inflammation. The appendix, small bowel and stomach are within normal limits. Vascular/Lymphatic: No significant vascular findings are present. No enlarged abdominal or pelvic lymph nodes. Reproductive: Prostate is unremarkable. Other: No abdominal wall hernia or abnormality. No abdominopelvic ascites. Musculoskeletal: No acute or significant osseous findings. IMPRESSION: 1. No acute localizing process in the abdomen or pelvis. 2. Stable findings of cirrhosis and portal hypertension. 3. Cholelithiasis. Electronically Signed   By: Darliss Cheney M.D.   On: 11/05/2021  18:08    DG Chest 2 View   Result Date: 11/05/2021 CLINICAL DATA:  Altered mental status EXAM: CHEST - 2 VIEW COMPARISON:  07/19/2021 FINDINGS: Transverse diameter of heart is increased. There are no signs of pulmonary edema or focal pulmonary consolidation. There is no pleural effusion or pneumothorax. IMPRESSION: There are no focal infiltrates or signs of pulmonary edema. Electronically Signed   By: Ernie Avena M.D.   On: 11/05/2021 13:27     The ASCVD Risk score (Arnett DK, et al., 2019) failed to calculate for the following reasons:   Cannot find a previous HDL lab   Cannot find a previous total cholesterol lab    Assessment & Plan:   Problem List Items Addressed This Visit       Digestive   Alcoholic cirrhosis of liver with ascites (HCC)    Reassess metabolic panel refill all medication      Relevant Orders   IR Paracentesis   CBC with Differential/Platelet   Comprehensive metabolic panel     Hematopoietic and Hemostatic   Thrombocytopenia (HCC) (Chronic)   Relevant Orders   CBC with Differential/Platelet   Pancytopenia (HCC)    Assess CBC      Relevant Medications   ferrous sulfate 325 (65 FE) MG tablet   folic acid (FOLVITE)  1 MG tablet   Other Relevant Orders   CBC with Differential/Platelet     Other   Hypokalemia   Relevant Orders   Comprehensive metabolic panel   SBP (spontaneous bacterial peritonitis) (HCC) s/t Decompensated Hepatic Cirrhosis - Primary   Relevant Medications   sulfamethoxazole-trimethoprim (BACTRIM DS) 800-160 MG tablet   Other Relevant Orders   IR Paracentesis   CBC with Differential/Platelet   Spontaneous bacterial peritonitis (HCC)    Suspect this is recurrent he has not had his antibiotics refilled we will plan to get paracentesis by IR with cultures resume Bactrim may yet require hospitalization for intravenous antibiotic      Relevant Medications   sulfamethoxazole-trimethoprim (BACTRIM DS) 800-160 MG tablet    Return in about 1 month (around 09/17/2022) for alcohol cirrhosis, primary care follow up.  30 minutes spent extra time needed for Spanish language interpretation and assessment of high risk conditions complex high Shan Levans, MD

## 2022-08-17 NOTE — Patient Instructions (Addendum)
Get your medications refilled today at our pharmacy downstairs A referral to radiology will be made today to get more fluid sampled from your abdomen You may need to go to hospital for more antibiotics  in your veins Get oral antibiotic today sulfamethoxazole one twice daily Return DR [name] one month  [place] resurtido de sus medicamentos hoy en nuestra [place] se realizar una derivacin a radiologa para obtener ms muestras de lquido de su abdomen. Es posible que deba ir al hospital para que le apliquen ms antibiticos en las venas. Bryon Lions antibitico oral hoy sulfametoxazol una vez al da [place] DR [name] un mes

## 2022-08-17 NOTE — Assessment & Plan Note (Signed)
Reassess metabolic panel refill all medication

## 2022-08-17 NOTE — Assessment & Plan Note (Signed)
Suspect this is recurrent he has not had his antibiotics refilled we will plan to get paracentesis by IR with cultures resume Bactrim may yet require hospitalization for intravenous antibiotic

## 2022-08-17 NOTE — Assessment & Plan Note (Signed)
Assess CBC

## 2022-08-18 ENCOUNTER — Ambulatory Visit (HOSPITAL_COMMUNITY)
Admission: RE | Admit: 2022-08-18 | Discharge: 2022-08-18 | Disposition: A | Discharge status: Home / Self Care | Payer: Self-pay | Source: Ambulatory Visit | Attending: Critical Care Medicine | Admitting: Critical Care Medicine

## 2022-08-18 DIAGNOSIS — K652 Spontaneous bacterial peritonitis: Secondary | ICD-10-CM | POA: Insufficient documentation

## 2022-08-18 DIAGNOSIS — K7031 Alcoholic cirrhosis of liver with ascites: Secondary | ICD-10-CM | POA: Insufficient documentation

## 2022-08-18 HISTORY — PX: IR PARACENTESIS: IMG2679

## 2022-08-18 LAB — BODY FLUID CELL COUNT WITH DIFFERENTIAL
Eos, Fluid: 0 %
Lymphs, Fluid: 51 %
Monocyte-Macrophage-Serous Fluid: 46 % — ABNORMAL LOW (ref 50–90)
Neutrophil Count, Fluid: 3 % (ref 0–25)
Total Nucleated Cell Count, Fluid: 185 cu mm (ref 0–1000)

## 2022-08-18 LAB — GLUCOSE, PLEURAL OR PERITONEAL FLUID: Glucose, Fluid: 119 mg/dL

## 2022-08-18 LAB — GRAM STAIN

## 2022-08-18 MED ORDER — LIDOCAINE HCL 1 % IJ SOLN
INTRAMUSCULAR | Status: AC
  Filled 2022-08-18: qty 20

## 2022-08-18 MED ORDER — LIDOCAINE HCL 1 % IJ SOLN
20.0000 mL | Freq: Once | INTRAMUSCULAR | Status: AC
  Administered 2022-08-18: 10 mL via INTRADERMAL

## 2022-08-18 NOTE — Procedures (Signed)
PROCEDURE SUMMARY:  Successful image-guided paracentesis from the left lower abdomen.  Yielded 5.2 liters of yellow fluid.  No immediate complications.  EBL = trace. Patient tolerated well.   Specimen was sent for labs.  Please see imaging section of Epic for full dictation.   Kennieth Francois PA-C 08/18/2022 9:58 AM

## 2022-08-18 NOTE — Progress Notes (Signed)
Let pt know liver is better , kidney normal waiting on blood counts go to get drainage of abdomen done today

## 2022-08-22 ENCOUNTER — Other Ambulatory Visit: Payer: Self-pay

## 2022-09-21 ENCOUNTER — Ambulatory Visit: Payer: Self-pay | Admitting: Critical Care Medicine
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
## 2022-09-25 ENCOUNTER — Other Ambulatory Visit: Payer: Self-pay

## 2022-09-25 ENCOUNTER — Inpatient Hospital Stay (HOSPITAL_COMMUNITY)
Admission: EM | Admit: 2022-09-25 | Discharge: 2022-09-28 | DRG: 433 | Disposition: A | Discharge status: Home / Self Care | Payer: MEDICAID | Attending: Internal Medicine | Admitting: Internal Medicine

## 2022-09-25 ENCOUNTER — Encounter (HOSPITAL_COMMUNITY): Payer: Self-pay | Admitting: Emergency Medicine

## 2022-09-25 DIAGNOSIS — Z79899 Other long term (current) drug therapy: Secondary | ICD-10-CM | POA: Diagnosis not present

## 2022-09-25 DIAGNOSIS — Z91148 Patient's other noncompliance with medication regimen for other reason: Secondary | ICD-10-CM

## 2022-09-25 DIAGNOSIS — K7460 Unspecified cirrhosis of liver: Secondary | ICD-10-CM | POA: Diagnosis present

## 2022-09-25 DIAGNOSIS — E611 Iron deficiency: Secondary | ICD-10-CM | POA: Diagnosis present

## 2022-09-25 DIAGNOSIS — D61818 Other pancytopenia: Secondary | ICD-10-CM | POA: Diagnosis present

## 2022-09-25 DIAGNOSIS — F1010 Alcohol abuse, uncomplicated: Secondary | ICD-10-CM | POA: Diagnosis present

## 2022-09-25 DIAGNOSIS — E8809 Other disorders of plasma-protein metabolism, not elsewhere classified: Secondary | ICD-10-CM | POA: Diagnosis present

## 2022-09-25 DIAGNOSIS — K7030 Alcoholic cirrhosis of liver without ascites: Secondary | ICD-10-CM | POA: Diagnosis present

## 2022-09-25 DIAGNOSIS — K7031 Alcoholic cirrhosis of liver with ascites: Secondary | ICD-10-CM | POA: Diagnosis present

## 2022-09-25 DIAGNOSIS — K652 Spontaneous bacterial peritonitis: Secondary | ICD-10-CM | POA: Diagnosis present

## 2022-09-25 DIAGNOSIS — E871 Hypo-osmolality and hyponatremia: Secondary | ICD-10-CM | POA: Diagnosis present

## 2022-09-25 DIAGNOSIS — Y902 Blood alcohol level of 40-59 mg/100 ml: Secondary | ICD-10-CM | POA: Diagnosis present

## 2022-09-25 DIAGNOSIS — D684 Acquired coagulation factor deficiency: Secondary | ICD-10-CM | POA: Diagnosis present

## 2022-09-25 DIAGNOSIS — K7290 Hepatic failure, unspecified without coma: Secondary | ICD-10-CM | POA: Diagnosis present

## 2022-09-25 DIAGNOSIS — R7881 Bacteremia: Secondary | ICD-10-CM | POA: Diagnosis present

## 2022-09-25 DIAGNOSIS — Z720 Tobacco use: Secondary | ICD-10-CM | POA: Diagnosis present

## 2022-09-25 DIAGNOSIS — Z8619 Personal history of other infectious and parasitic diseases: Secondary | ICD-10-CM

## 2022-09-25 DIAGNOSIS — F17210 Nicotine dependence, cigarettes, uncomplicated: Secondary | ICD-10-CM | POA: Diagnosis present

## 2022-09-25 DIAGNOSIS — D696 Thrombocytopenia, unspecified: Secondary | ICD-10-CM | POA: Diagnosis present

## 2022-09-25 DIAGNOSIS — E872 Acidosis, unspecified: Secondary | ICD-10-CM | POA: Insufficient documentation

## 2022-09-25 DIAGNOSIS — K7469 Other cirrhosis of liver: Secondary | ICD-10-CM

## 2022-09-25 DIAGNOSIS — D539 Nutritional anemia, unspecified: Secondary | ICD-10-CM | POA: Diagnosis present

## 2022-09-25 DIAGNOSIS — E8770 Fluid overload, unspecified: Secondary | ICD-10-CM | POA: Diagnosis present

## 2022-09-25 DIAGNOSIS — D6959 Other secondary thrombocytopenia: Secondary | ICD-10-CM | POA: Diagnosis present

## 2022-09-25 DIAGNOSIS — K219 Gastro-esophageal reflux disease without esophagitis: Secondary | ICD-10-CM | POA: Diagnosis present

## 2022-09-25 DIAGNOSIS — B9620 Unspecified Escherichia coli [E. coli] as the cause of diseases classified elsewhere: Secondary | ICD-10-CM | POA: Diagnosis present

## 2022-09-25 DIAGNOSIS — K429 Umbilical hernia without obstruction or gangrene: Secondary | ICD-10-CM | POA: Diagnosis present

## 2022-09-25 DIAGNOSIS — D689 Coagulation defect, unspecified: Secondary | ICD-10-CM | POA: Insufficient documentation

## 2022-09-25 DIAGNOSIS — K8020 Calculus of gallbladder without cholecystitis without obstruction: Secondary | ICD-10-CM | POA: Diagnosis present

## 2022-09-25 DIAGNOSIS — F1090 Alcohol use, unspecified, uncomplicated: Secondary | ICD-10-CM | POA: Diagnosis present

## 2022-09-25 LAB — CBC WITH DIFFERENTIAL/PLATELET
Abs Immature Granulocytes: 0.02 10*3/uL (ref 0.00–0.07)
Basophils Absolute: 0 10*3/uL (ref 0.0–0.1)
Basophils Relative: 0 %
Eosinophils Absolute: 0.1 10*3/uL (ref 0.0–0.5)
Eosinophils Relative: 2 %
HCT: 30.2 % — ABNORMAL LOW (ref 39.0–52.0)
Hemoglobin: 10.4 g/dL — ABNORMAL LOW (ref 13.0–17.0)
Immature Granulocytes: 1 %
Lymphocytes Relative: 9 %
Lymphs Abs: 0.4 10*3/uL — ABNORMAL LOW (ref 0.7–4.0)
MCH: 36.4 pg — ABNORMAL HIGH (ref 26.0–34.0)
MCHC: 34.4 g/dL (ref 30.0–36.0)
MCV: 105.6 fL — ABNORMAL HIGH (ref 80.0–100.0)
Monocytes Absolute: 0.2 10*3/uL (ref 0.1–1.0)
Monocytes Relative: 5 %
Neutro Abs: 3.2 10*3/uL (ref 1.7–7.7)
Neutrophils Relative %: 83 %
Platelets: 47 10*3/uL — ABNORMAL LOW (ref 150–400)
RBC: 2.86 MIL/uL — ABNORMAL LOW (ref 4.22–5.81)
RDW: 14.1 % (ref 11.5–15.5)
Smear Review: DECREASED
WBC: 3.8 10*3/uL — ABNORMAL LOW (ref 4.0–10.5)
nRBC: 0 % (ref 0.0–0.2)

## 2022-09-25 LAB — PHOSPHORUS: Phosphorus: 3.6 mg/dL (ref 2.5–4.6)

## 2022-09-25 LAB — I-STAT CG4 LACTIC ACID, ED: Lactic Acid, Venous: 1.7 mmol/L (ref 0.5–1.9)

## 2022-09-25 LAB — PROTIME-INR
INR: 1.9 — ABNORMAL HIGH (ref 0.8–1.2)
Prothrombin Time: 21.6 s — ABNORMAL HIGH (ref 11.4–15.2)

## 2022-09-25 LAB — GLUCOSE, PLEURAL OR PERITONEAL FLUID: Glucose, Fluid: 122 mg/dL

## 2022-09-25 LAB — GRAM STAIN

## 2022-09-25 LAB — PROTEIN, PLEURAL OR PERITONEAL FLUID: Total protein, fluid: <3 g/dL

## 2022-09-25 LAB — COMPREHENSIVE METABOLIC PANEL
ALT: 19 U/L (ref 0–44)
AST: 40 U/L (ref 15–41)
Albumin: 2.5 g/dL — ABNORMAL LOW (ref 3.5–5.0)
Alkaline Phosphatase: 122 U/L (ref 38–126)
Anion gap: 9 (ref 5–15)
BUN: 5 mg/dL — ABNORMAL LOW (ref 6–20)
CO2: 19 mmol/L — ABNORMAL LOW (ref 22–32)
Calcium: 7.9 mg/dL — ABNORMAL LOW (ref 8.9–10.3)
Chloride: 102 mmol/L (ref 98–111)
Creatinine, Ser: 1 mg/dL (ref 0.61–1.24)
GFR, Estimated: >60 mL/min (ref >60)
Glucose, Bld: 118 mg/dL — ABNORMAL HIGH (ref 70–99)
Potassium: 3.9 mmol/L (ref 3.5–5.1)
Sodium: 130 mmol/L — ABNORMAL LOW (ref 135–145)
Total Bilirubin: 4 mg/dL — ABNORMAL HIGH (ref 0.3–1.2)
Total Protein: 6.3 g/dL — ABNORMAL LOW (ref 6.5–8.1)

## 2022-09-25 LAB — MAGNESIUM: Magnesium: 1.7 mg/dL (ref 1.7–2.4)

## 2022-09-25 LAB — LACTATE DEHYDROGENASE, PLEURAL OR PERITONEAL FLUID: LD, Fluid: 33 U/L — ABNORMAL HIGH (ref 3–23)

## 2022-09-25 LAB — ALBUMIN, PLEURAL OR PERITONEAL FLUID: Albumin, Fluid: <1.5 g/dL

## 2022-09-25 LAB — ETHANOL: Alcohol, Ethyl (B): 53 mg/dL — ABNORMAL HIGH (ref <10)

## 2022-09-25 MED ORDER — SODIUM CHLORIDE 0.9% FLUSH: 3.0000 mL | INTRAVENOUS | Status: DC | PRN

## 2022-09-25 MED ORDER — ADULT MULTIVITAMIN W/MINERALS CH
1.0000 | ORAL_TABLET | Freq: Every day | ORAL | Status: DC
  Administered 2022-09-25 – 2022-09-28 (×4): 1 via ORAL
  Filled 2022-09-25 (×4): qty 1

## 2022-09-25 MED ORDER — ONDANSETRON HCL 4 MG/2ML IJ SOLN: 4.0000 mg | Freq: Four times a day (QID) | INTRAMUSCULAR | Status: DC | PRN

## 2022-09-25 MED ORDER — ALBUMIN HUMAN 5 % IV SOLN
12.5000 g | Freq: Once | INTRAVENOUS | Status: AC
  Administered 2022-09-25: 12.5 g via INTRAVENOUS
  Filled 2022-09-25: qty 250

## 2022-09-25 MED ORDER — ACETAMINOPHEN 325 MG PO TABS
650.0000 mg | ORAL_TABLET | Freq: Four times a day (QID) | ORAL | Status: DC | PRN
  Administered 2022-09-25 – 2022-09-27 (×2): 650 mg via ORAL
  Filled 2022-09-25 (×2): qty 2

## 2022-09-25 MED ORDER — SPIRONOLACTONE 25 MG PO TABS
50.0000 mg | ORAL_TABLET | Freq: Every day | ORAL | Status: DC
  Administered 2022-09-26 – 2022-09-28 (×3): 50 mg via ORAL
  Filled 2022-09-25 (×3): qty 2

## 2022-09-25 MED ORDER — FOLIC ACID 1 MG PO TABS
1.0000 mg | ORAL_TABLET | Freq: Every day | ORAL | Status: DC
  Administered 2022-09-25 – 2022-09-28 (×4): 1 mg via ORAL
  Filled 2022-09-25 (×5): qty 1

## 2022-09-25 MED ORDER — SODIUM CHLORIDE 0.9 % IV SOLN
1.0000 g | Freq: Three times a day (TID) | INTRAVENOUS | Status: DC
  Filled 2022-09-25 (×2): qty 20

## 2022-09-25 MED ORDER — SODIUM CHLORIDE 0.9 % IV SOLN
250.0000 mL | INTRAVENOUS | Status: DC | PRN
  Administered 2022-09-27: 250 mL via INTRAVENOUS

## 2022-09-25 MED ORDER — SODIUM BICARBONATE 650 MG PO TABS
650.0000 mg | ORAL_TABLET | Freq: Two times a day (BID) | ORAL | Status: DC
  Administered 2022-09-25 – 2022-09-28 (×6): 650 mg via ORAL
  Filled 2022-09-25 (×6): qty 1

## 2022-09-25 MED ORDER — LIDOCAINE HCL 2 % IJ SOLN
10.0000 mL | Freq: Once | INTRAMUSCULAR | Status: AC
  Administered 2022-09-25: 200 mg
  Filled 2022-09-25: qty 20

## 2022-09-25 MED ORDER — MIDODRINE HCL 5 MG PO TABS: 10.0000 mg | ORAL_TABLET | Freq: Three times a day (TID) | ORAL | Status: DC

## 2022-09-25 MED ORDER — SODIUM CHLORIDE 0.9% FLUSH
3.0000 mL | Freq: Two times a day (BID) | INTRAVENOUS | Status: DC
  Administered 2022-09-26 – 2022-09-28 (×5): 3 mL via INTRAVENOUS

## 2022-09-25 MED ORDER — SODIUM CHLORIDE 0.9 % IV SOLN
1.0000 g | Freq: Once | INTRAVENOUS | Status: AC
  Administered 2022-09-25: 1 g via INTRAVENOUS
  Filled 2022-09-25: qty 20

## 2022-09-25 MED ORDER — LORAZEPAM 2 MG/ML IJ SOLN: 1.0000 mg | INTRAMUSCULAR | Status: DC | PRN

## 2022-09-25 MED ORDER — ALBUMIN HUMAN 25 % IV SOLN: 50.0000 g | Freq: Every day | INTRAVENOUS | Status: DC

## 2022-09-25 MED ORDER — PROPRANOLOL HCL 10 MG PO TABS: 10.0000 mg | ORAL_TABLET | Freq: Two times a day (BID) | ORAL | Status: DC

## 2022-09-25 MED ORDER — FERROUS SULFATE 325 (65 FE) MG PO TABS
325.0000 mg | ORAL_TABLET | Freq: Every day | ORAL | Status: DC
  Administered 2022-09-26 – 2022-09-28 (×3): 325 mg via ORAL
  Filled 2022-09-25 (×3): qty 1

## 2022-09-25 MED ORDER — THIAMINE HCL 100 MG/ML IJ SOLN
100.0000 mg | Freq: Every day | INTRAMUSCULAR | Status: DC
  Filled 2022-09-25 (×2): qty 2

## 2022-09-25 MED ORDER — ONDANSETRON HCL 4 MG PO TABS: 4.0000 mg | ORAL_TABLET | Freq: Four times a day (QID) | ORAL | Status: DC | PRN

## 2022-09-25 MED ORDER — PANTOPRAZOLE SODIUM 40 MG PO TBEC
40.0000 mg | DELAYED_RELEASE_TABLET | Freq: Every day | ORAL | Status: DC
  Administered 2022-09-25 – 2022-09-28 (×4): 40 mg via ORAL
  Filled 2022-09-25 (×4): qty 1

## 2022-09-25 MED ORDER — FUROSEMIDE 20 MG PO TABS
20.0000 mg | ORAL_TABLET | Freq: Every day | ORAL | Status: DC
  Administered 2022-09-25 – 2022-09-28 (×4): 20 mg via ORAL
  Filled 2022-09-25 (×4): qty 1

## 2022-09-25 MED ORDER — LORAZEPAM 1 MG PO TABS: 1.0000 mg | ORAL_TABLET | ORAL | Status: DC | PRN

## 2022-09-25 MED ORDER — MORPHINE SULFATE (PF) 4 MG/ML IV SOLN
4.0000 mg | Freq: Once | INTRAVENOUS | Status: AC
  Administered 2022-09-25: 4 mg via INTRAVENOUS
  Filled 2022-09-25: qty 1

## 2022-09-25 MED ORDER — SENNOSIDES-DOCUSATE SODIUM 8.6-50 MG PO TABS: 1.0000 | ORAL_TABLET | Freq: Every evening | ORAL | Status: DC | PRN

## 2022-09-25 MED ORDER — MORPHINE SULFATE (PF) 2 MG/ML IV SOLN: 1.0000 mg | INTRAVENOUS | Status: DC | PRN

## 2022-09-25 MED ORDER — LACTULOSE 10 GM/15ML PO SOLN
30.0000 g | Freq: Four times a day (QID) | ORAL | Status: DC
  Administered 2022-09-25 – 2022-09-28 (×10): 30 g via ORAL
  Filled 2022-09-25 (×10): qty 45

## 2022-09-25 MED ORDER — ACETAMINOPHEN 650 MG RE SUPP: 650.0000 mg | Freq: Four times a day (QID) | RECTAL | Status: DC | PRN

## 2022-09-25 MED ORDER — THIAMINE MONONITRATE 100 MG PO TABS
100.0000 mg | ORAL_TABLET | Freq: Every day | ORAL | Status: DC
  Administered 2022-09-25 – 2022-09-28 (×4): 100 mg via ORAL
  Filled 2022-09-25 (×4): qty 1

## 2022-09-25 NOTE — Progress Notes (Signed)
Pharmacy Antibiotic Note  Mathew [name] is a 42 y.o. male for which pharmacy has been consulted for meropenem dosing for  SBP .  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 Patient presenting with abdominal pain, distention for past few days.  Estimated Creatinine Clearance: 82.9 mL/min (by C-G formula based on SCr of 1 mg/dL).  WBC 3.8; LA 1.7; T 99.5; HR 84; RR 16  Plan: Meropenem 1g q8h Monitor WBC, fever, renal function, cultures De-escalate when able  Height: 5\' 1"  (154.9 cm) Weight: 77.1 kg (170 lb) IBW/kg (Calculated) : 52.3  Temp (24hrs), Avg:99.5 F (37.5 C), Min:99.5 F (37.5 C), Max:99.5 F (37.5 C)  Recent Labs  Lab 09/25/22 1936 09/25/22 1940  WBC 3.8*  --   CREATININE 1.00  --   LATICACIDVEN  --  1.7    Estimated Creatinine Clearance: 82.9 mL/min (by C-G formula based on SCr of 1 mg/dL).    No Known Allergies  Microbiology results: Pending  Thank you for allowing pharmacy to be a part of this patient's care.  Delmar Landau, PharmD, BCPS 09/25/2022 9:24 PM ED Clinical Pharmacist -  [phone]

## 2022-09-25 NOTE — ED Provider Notes (Signed)
.  Paracentesis  Date/Time: 09/25/2022 9:18 PM  Performed by: Claretha Cooper, DO Authorized by: Charlynne Pander, MD   Consent:    Consent obtained:  Written   Consent given by:  Patient   Risks, benefits, and alternatives were discussed: yes     Risks discussed:  Bleeding, bowel perforation, infection and pain   Alternatives discussed:  No treatment, alternative treatment and delayed treatment Universal protocol:    Procedure explained and questions answered to patient or proxy's satisfaction: yes     Relevant documents present and verified: yes     Test results available: yes     Imaging studies available: yes     Required blood products, implants, devices, and special equipment available: yes     Site/side marked: yes     Immediately prior to procedure, a time out was called: yes     Patient identity confirmed:  Arm band Pre-procedure details:    Procedure purpose:  Therapeutic   Preparation: Patient was prepped and draped in usual sterile fashion   Anesthesia:    Anesthesia method:  Local infiltration   Local anesthetic:  Lidocaine 1% w/o epi Procedure details:    Needle gauge:  18   Ultrasound guidance: yes     Puncture site:  L lower quadrant   Fluid removed amount:  5L   Fluid appearance:  Yellow   Dressing:  4x4 sterile gauze and adhesive bandage Post-procedure details:    Procedure completion:  Tolerated well, no immediate complications     Claretha Cooper, DO 09/25/22 2119    Charlynne Pander, MD 09/25/22 2129

## 2022-09-25 NOTE — ED Triage Notes (Signed)
Per [facility] pt coming from home c/o abdominal pain and distention for past few weeks. Abdomen obviously distended. Pt has umbilical hernia. States he has taken medications as prescribed. Patient admits to alcohol use yesterday with none today.

## 2022-09-25 NOTE — Plan of Care (Signed)
Problem: Coping: Goal: Level of anxiety will decrease Outcome: Progressing   Problem: Elimination: Goal: Will not experience complications related to urinary retention Outcome: Progressing   Problem: Safety: Goal: Ability to remain free from injury will improve Outcome: Progressing

## 2022-09-25 NOTE — H&P (Addendum)
History and Physical    Mathew [name] ZOX:[id number] DOB: 1980-10-28 DOA: 09/25/2022  PCP: [name], MD   Patient coming from: Home   Chief Complaint:  Chief Complaint  Patient presents with   Abdominal Pain    HPI:  Mathew [name] is a 42 y.o. male with medical history significant of chronic pancytopenia, alcoholic cirrhosis, history of SBP due to ESBL and history of chronic alcohol use presented to emergency department evaluation for abdominal pain, distention for past few days.  Patient also has history of umbilical hernia.  Patient reported using alcohol last drink was yesterday 9/14.  Per chart review patient has episode of SBP in June 2024.  He underwent paracentesis treated with IV antibiotic and and discharged home with TMP-SMX prophylaxis. Reported that due to his insurance problem and financial constraint he ran out of all medications for last 2 months. Presentation to today patient reported that he continues to have abdominal pain with some chills for last 2 weeks.  Patient is endorsing severe abdominal pain 10 out of 10 intensity with associated poor appetite.  Denies any nausea, vomiting, constipation and diarrhea.  Denies any vomiting of blood or noticing any blood in his stool.  Patient seems uncomfortable due to abdominal pain. Patient does not have any other complaint at this time.   ED Course:  On presentation to ED patient is hemodynamically stable. CBC showed WBC 3.2, RBC 2.86, hemoglobin 10.4, hematocrit 30.2, MCV 105, low platelet 47. CMP showed low sodium 130, potassium 3.9, chloride 102, bicarb low 19, blood glucose 118, BUN 5, creatinine 1, low calcium 7.9, low albumin 2.5, AST 40, ALT 90, ALP 122, elevated bilirubin 4, GFR above 60 anion gap 9. Elevated INR 1.9 and elevated prothrombin time 21.6. Blood cultures are in process. Blood alcohol level elevated 53. Lactic acid 1.7.  In the ED Dr. Silverio Lay already perform paracentesis and 5  L fluid has been removed.  Paracentesis fluid has been sent for cell count, Gram stain, culture and sensitivity.  Based on previous ESBL associated SBP patient has been started treating empirically with meropenem.  Hospitalist has been contacted for further evaluation management.  Review of Systems:  Review of Systems  Constitutional:  Positive for chills, malaise/fatigue and weight loss. Negative for diaphoresis and fever.  Respiratory:  Negative for cough, sputum production and shortness of breath.   Cardiovascular:  Negative for chest pain and leg swelling.  Gastrointestinal:  Positive for abdominal pain. Negative for blood in stool, constipation, diarrhea, heartburn, melena, nausea and vomiting.  Genitourinary:  Negative for dysuria, frequency, hematuria and urgency.  Musculoskeletal:  Negative for back pain, joint pain, myalgias and neck pain.  Skin:  Negative for rash.  Neurological:  Negative for dizziness and headaches.  Endo/Heme/Allergies:  Negative for environmental allergies. Does not bruise/bleed easily.  Psychiatric/Behavioral:  The patient is not nervous/anxious.     Past Medical History:  Diagnosis Date   Acute metabolic encephalopathy 06/20/2021   Alcohol withdrawal seizure (HCC)    Cirrhosis (HCC)    ETOH abuse    Pneumonia 04/02/2020   SBP (spontaneous bacterial peritonitis) (HCC) 06/17/2021    Past Surgical History:  Procedure Laterality Date   COLONOSCOPY WITH PROPOFOL N/A 03/17/2018   Procedure: COLONOSCOPY WITH PROPOFOL;  Surgeon: Bernette Redbird, MD;  Location[facility] ENDOSCOPY;  Service: Endoscopy;  Laterality: N/A;   ESOPHAGOGASTRODUODENOSCOPY (EGD) WITH PROPOFOL N/A 03/17/2018   Procedure: ESOPHAGOGASTRODUODENOSCOPY (EGD) WITH PROPOFOL;  Surgeon: Bernette Redbird, MD;  Location: [facility] ENDOSCOPY;  Service: Endoscopy;  Laterality: N/A;   IR PARACENTESIS  03/14/2018   IR PARACENTESIS  11/18/2020   IR PARACENTESIS  06/17/2021   IR PARACENTESIS  08/30/2021   IR PARACENTESIS   09/02/2021   IR PARACENTESIS  11/16/2021   IR PARACENTESIS  03/01/2022   IR PARACENTESIS  05/31/2022   IR PARACENTESIS  06/02/2022   IR PARACENTESIS  06/23/2022   IR PARACENTESIS  07/01/2022   IR PARACENTESIS  08/18/2022     reports that he has been smoking cigarettes. He started smoking about 3 years ago. He has a 0.8 pack-year smoking history. He has never used smokeless tobacco. He reports current alcohol use of about 2.0 standard drinks of alcohol per week. He reports that he does not use drugs.  No Known Allergies  Family History  Adopted: Yes  Problem Relation Age of Onset   Cancer Neg Hx    Heart disease Neg Hx     Prior to Admission medications   Medication Sig Start Date End Date Taking? Authorizing Provider  ferrous sulfate 325 (65 FE) MG tablet Take 1 tablet (325 mg total) by mouth daily with breakfast. 08/17/22   [name], MD  folic acid (FOLVITE) 1 MG tablet Take 1 tablet (1 mg total) by mouth daily. 08/17/22   [name], MD  furosemide (LASIX) 20 MG tablet Take 1 tablet (20 mg total) by mouth daily. 08/17/22   [name], MD  lactulose (CHRONULAC) 10 GM/15ML solution Take 45 mLs (30 g total) by mouth every 6 (six) hours. 08/17/22   [name], MD  midodrine (PROAMATINE) 10 MG tablet Take 1 tablet (10 mg total) by mouth 3 (three) times daily with meals. 08/17/22   [name], MD  pantoprazole (PROTONIX) 40 MG tablet Take 1 tablet (40 mg total) by mouth daily at 6 (six) AM. 08/17/22   [name], MD  propranolol (INDERAL) 10 MG tablet Take 1 tablet (10 mg total) by mouth 2 (two) times daily. 08/17/22   [name], MD  sodium bicarbonate 650 MG tablet Take 1 tablet (650 mg total) by mouth 2 (two) times daily. 08/17/22   [name], MD  spironolactone (ALDACTONE) 50 MG tablet Take 1 tablet (50 mg total) by mouth daily. 08/17/22   [name], MD  sulfamethoxazole-trimethoprim (BACTRIM DS) 800-160 MG tablet Take 1 tablet by mouth daily.  08/17/22   [name], MD     Physical Exam: Vitals:   09/25/22 2015 09/25/22 2030 09/25/22 2115 09/25/22 2145  BP: 132/69 (!) 133/57 96/63   Pulse: 88 84 82   Resp:   15   Temp:    98.5 F (36.9 C)  TempSrc:    Oral  SpO2: 100% 100% 97%   Weight:      Height:        Physical Exam Constitutional:      General: He is not in acute distress.    Appearance: He is ill-appearing.  Cardiovascular:     Rate and Rhythm: Normal rate and regular rhythm.  Pulmonary:     Effort: Pulmonary effort is normal.     Breath sounds: Normal breath sounds.  Abdominal:     General: Bowel sounds are normal.     Palpations: Abdomen is soft. There is shifting dullness, hepatomegaly and splenomegaly.     Tenderness: There is generalized abdominal tenderness. There is no guarding or rebound. Negative signs include Murphy's sign, Rovsing's sign, McBurney's sign and  psoas sign.     Hernia: A hernia is present. Hernia is present in the umbilical area.  Skin:    General: Skin is warm and dry.     Capillary Refill: Capillary refill takes less than 2 seconds.  Neurological:     Mental Status: He is oriented to person, place, and time.  Psychiatric:        Mood and Affect: Mood normal. Mood is not anxious or depressed.        Behavior: Behavior normal.      Labs on Admission: I have personally reviewed following labs and imaging studies  CBC: Recent Labs  Lab 09/25/22 1936  WBC 3.8*  NEUTROABS 3.2  HGB 10.4*  HCT 30.2*  MCV 105.6*  PLT 47*   Basic Metabolic Panel: Recent Labs  Lab 09/25/22 1936  NA 130*  K 3.9  CL 102  CO2 19*  GLUCOSE 118*  BUN 5*  CREATININE 1.00  CALCIUM 7.9*   GFR: Estimated Creatinine Clearance: 82.9 mL/min (by C-G formula based on SCr of 1 mg/dL). Liver Function Tests: Recent Labs  Lab 09/25/22 1936  AST 40  ALT 19  ALKPHOS 122  BILITOT 4.0*  PROT 6.3*  ALBUMIN 2.5*   No results for input(s): "LIPASE", "AMYLASE" in the last 168 hours. No  results for input(s): "AMMONIA" in the last 168 hours. Coagulation Profile: Recent Labs  Lab 09/25/22 1936  INR 1.9*   Cardiac Enzymes: No results for input(s): "CKTOTAL", "CKMB", "CKMBINDEX", "TROPONINI", "TROPONINIHS" in the last 168 hours. BNP (last 3 results) Recent Labs    11/06/21 0603 11/07/21 0158 02/28/22 1240  BNP 37.4 65.6 34.3   HbA1C: No results for input(s): "HGBA1C" in the last 72 hours. CBG: No results for input(s): "GLUCAP" in the last 168 hours. Lipid Profile: No results for input(s): "CHOL", "HDL", "LDLCALC", "TRIG", "CHOLHDL", "LDLDIRECT" in the last 72 hours. Thyroid Function Tests: No results for input(s): "TSH", "T4TOTAL", "FREET4", "T3FREE", "THYROIDAB" in the last 72 hours. Anemia Panel: No results for input(s): "VITAMINB12", "FOLATE", "FERRITIN", "TIBC", "IRON", "RETICCTPCT" in the last 72 hours. Urine analysis:    Component Value Date/Time   COLORURINE AMBER (A) 05/31/2022 0445   APPEARANCEUR HAZY (A) 05/31/2022 0445   LABSPEC 1.013 05/31/2022 0445   PHURINE 5.0 05/31/2022 0445   GLUCOSEU NEGATIVE 05/31/2022 0445   HGBUR LARGE (A) 05/31/2022 0445   BILIRUBINUR SMALL (A) 05/31/2022 0445   KETONESUR NEGATIVE 05/31/2022 0445   PROTEINUR 30 (A) 05/31/2022 0445   UROBILINOGEN 0.2 08/02/2014 2045   NITRITE NEGATIVE 05/31/2022 0445   LEUKOCYTESUR NEGATIVE 05/31/2022 0445    Radiological Exams on Admission: I have personally reviewed images No results found.  EKG: My personal interpretation of EKG shows: Sinus rhythm heart rate 78.  There is no ST and T wave abnormality.    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  Patient reported he ran out of all home medications for last 2 months due to financial and insurance problem - In the ED patient is hemodynamically stable -CBC showed WBC 3.2, RBC 2.86, hemoglobin 10.4, hematocrit 30.2, MCV 105, low platelet 47. -CMP showed low sodium 130, low albumin 2.5, AST 40, ALT 90, ALP 122, elevated bilirubin 4. -Lactic acid 1.7.  WNL. -Elevated INR 1.9 and elevated prothrombin time 21.6. -Blood cultures are are in process. -Checking ammonia level. - Obtaining sputum culture and urine culture. -Patient has history of ESBL associated SBP in May 2024.  He has been treated with meropenem and then transition to oral Bactrim for prophylaxis. - Patient is noncompliant with any medication.  Also is continues to drink alcohol on daily basis. -In the ED Dr. Silverio Lay already perform paracentesis and 5 L fluid has been removed.  Paracentesis fluid has been sent for cell count, Gram stain, culture and sensitivity.  Based on previous ESBL associated SBP patient has been started treating empirically with meropenem. -Physical exam showed positive fluid thrill, generalized abdominal tenderness.  There is no guarding and rebound tenderness.  Nonreducible umbilical hernia.   -Patient still has significant abdominal volume overload on physical exam.  Consulted IR for paracentesis tomorrow. -High MELD score  24 which signify 19.6% mortality rate in next 3 months - Child progress score 12 point/Classie.  Life expectancy 1 to 3-years. Plan: -Given patient has severe abdominal pain and history of umbilical hernia obtaining  stat CT abdomen to rule out any obstruction. - Continue albumin 50 g daily for 2 consecutive days. - Continue meropenem with pharmacy consult. - Will follow-up with paracentesis fluid cell count, Gram stain, culture, blood culture, sputum culture urine culture for further antibiotic guidance. - Continue Lasix 20 mg daily and spironolactone 50 mg daily. -Continue lactulose 30 g every 6 hours with goal to have 3-4 bowel movements in a day. - Holding propranolol in the setting of SBP to prevent hypotension.  Also patient does not have any active hematemesis or melena. - Holding diet as obtaining CT abdomen pelvis.   -Continue morphine 1 mg every 3 hours as needed for moderate and severe pain. - Continue monitor fever curve. -If patient does not have any significant clinical improvement improvement consider consult GI in the a.m.  Macrocytic anemia Pancytopenia due to cirrhosis Chronic thrombocytopenia -CBC showed low WBC count 3.8, RBC 2.86, hemoglobin 10.4, hematocrit 30.2 and platelet 47.  All the numbers that patient has baseline. - Continue to monitor for any overt evidence of bleeding. -Continue folic acid and iron supplement. -Checking CBC twice daily. -Obtaining type and screen in case patient need any blood transfusion during this admission.  Umbilical hernia -Patient has history of nonreducible umbilical hernia.  Continue to monitor for any acute abdominal finding.  Chronic alcohol use disorder Alcohol withdrawal - Patient reported continues to drinking few cans of beers every day.  Last drinking was afternoon of 09/24/2022 - Blood alcohol level elevated 53 - Continue CIWA protocol - Continue Ativan as needed - Continue Thera multivitamin, folic acid and thiamine  daily. -Continue fall precaution, aspiration precaution, seizure precaution and keep the head of the bed above 30 degree angle -  continue cardiac monitoring -Consulted transition care team.   History of chronic non-anion gap metabolic acidosis - Low bicarb 19.  Normal anion gap.  Anka metabolic acidosis in the setting of volume overload and alcohol use. -Continue home bicarb 650 mg twice daily. - Continue to monitor bicarb level  History of GERD - Continue Protonix 40 mg daily  DVT prophylaxis:  SCDs.  Defer pharmacological prophylaxis as patient has thrombocytopenia. Code Status:  Full Code.  Verified with patient Diet: Holding diet for CT abdomen pelvis. Family Communication: No family member at bedside Disposition Plan: Pending clinical improvement.  Tentative discharge to home next 3 to 5 days. Consults: Transition care team Admission status:   Inpatient, Telemetry bed  Severity of Illness: The appropriate patient status for this patient is INPATIENT. Inpatient status is judged to be reasonable and necessary in order to provide the required intensity of service to ensure the patient's safety. The patient's presenting symptoms, physical exam findings, and initial radiographic and laboratory data in the context of their chronic comorbidities is felt to place them at high risk for further clinical deterioration. Furthermore, it is not anticipated that the patient will be medically stable for discharge from the hospital within 2 midnights of admission.   * I certify that at the point of admission it is my clinical judgment that the patient will require inpatient hospital care spanning beyond 2 midnights from the point of admission due to high intensity of service, high risk for further deterioration and high frequency of surveillance required.Marland Kitchen    Tereasa Coop, MD [facility]  How to contact the [facility] Attending or Consulting provider 7A - 7P or covering provider during after  hours 7P -7A, for this patient.  Check the care team in [facility] and look for a) attending/consulting TR[facility] provider listed and b) the [facility] team listed Log into [email or web address] and use [place]'s universal password to access. If you do not have the password, please contact the hospital operator. Locate the [facility] provider you are looking for under [facility] and page to a number that you can be directly reached. If you still have difficulty reaching the provider, please page the [facility] (Director on Call) for the Hospitalists listed on amion for assistance.  09/25/2022, 10:03 PM

## 2022-09-25 NOTE — Progress Notes (Signed)
Pt arrived at 2300 from ED via stretcher. Moved self from stretcher to bed by self. Bed in low position and call light within reach, bed alarm on.

## 2022-09-25 NOTE — ED Provider Notes (Signed)
[place] EMERGENCY DEPARTMENT AT [facility] Provider Note   CSN: [id number] Arrival date & time: 09/25/22  1849     History  Chief Complaint  Patient presents with   Abdominal Pain    Mathew [name] is a 42 y.o. male history of alcohol cirrhosis, SBP here presenting with abdominal pain and distention.  Patient continues to drink alcohol.  Patient states that for the last few weeks, his abdomen has becoming more distended.  He states that he has an umbilical hernia that is not reducible now.  Patient continues to drink alcohol.  Patient was admitted for SBP from ESBL in March.  The history is provided by the patient.       Home Medications Prior to Admission medications   Medication Sig Start Date End Date Taking? Authorizing Provider  ferrous sulfate 325 (65 FE) MG tablet Take 1 tablet (325 mg total) by mouth daily with breakfast. 08/17/22   [name], MD  folic acid (FOLVITE) 1 MG tablet Take 1 tablet (1 mg total) by mouth daily. 08/17/22   [name], MD  furosemide (LASIX) 20 MG tablet Take 1 tablet (20 mg total) by mouth daily. 08/17/22   [name], MD  lactulose (CHRONULAC) 10 GM/15ML solution Take 45 mLs (30 g total) by mouth every 6 (six) hours. 08/17/22   [name], MD  midodrine (PROAMATINE) 10 MG tablet Take 1 tablet (10 mg total) by mouth 3 (three) times daily with meals. 08/17/22   [name], MD  pantoprazole (PROTONIX) 40 MG tablet Take 1 tablet (40 mg total) by mouth daily at 6 (six) AM. 08/17/22   [name], MD  propranolol (INDERAL) 10 MG tablet Take 1 tablet (10 mg total) by mouth 2 (two) times daily. 08/17/22   [name], MD  sodium bicarbonate 650 MG tablet Take 1 tablet (650 mg total) by mouth 2 (two) times daily. 08/17/22   [name], MD  spironolactone (ALDACTONE) 50 MG tablet Take 1 tablet (50 mg total) by mouth daily. 08/17/22   [name], MD  sulfamethoxazole-trimethoprim (BACTRIM  DS) 800-160 MG tablet Take 1 tablet by mouth daily. 08/17/22   [name], MD      Allergies    Patient has no known allergies.    Review of Systems   Review of Systems  Gastrointestinal:  Positive for abdominal pain.  All other systems reviewed and are negative.   Physical Exam Updated Vital Signs BP (!) 133/57   Pulse 84   Temp 99.5 F (37.5 C) (Oral)   Resp 16   Ht 5\' 1"  (1.549 m)   Wt 77.1 kg   SpO2 100%   BMI 32.12 kg/m  Physical Exam Vitals and nursing note reviewed.  Constitutional:      Comments: Uncomfortable and chronically ill  HENT:     Head: Normocephalic.     Mouth/Throat:     Pharynx: Oropharynx is clear.  Eyes:     Extraocular Movements: Extraocular movements intact.     Pupils: Pupils are equal, round, and reactive to light.  Cardiovascular:     Rate and Rhythm: Normal rate and regular rhythm.     Heart sounds: Normal heart sounds.  Pulmonary:     Effort: Pulmonary effort is normal.     Breath sounds: Normal breath sounds.  Abdominal:     Comments: Distended, diffuse tenderness.  Skin:    General: Skin is warm.  Capillary Refill: Capillary refill takes less than 2 seconds.  Neurological:     General: No focal deficit present.  Psychiatric:        Mood and Affect: Mood normal.        Behavior: Behavior normal.     ED Results / Procedures / Treatments   Labs (all labs ordered are listed, but only abnormal results are displayed) Labs Reviewed  CBC WITH DIFFERENTIAL/PLATELET - Abnormal; Notable for the following components:      Result Value   WBC 3.8 (*)    RBC 2.86 (*)    Hemoglobin 10.4 (*)    HCT 30.2 (*)    MCV 105.6 (*)    MCH 36.4 (*)    Platelets 47 (*)    Lymphs Abs 0.4 (*)    All other components within normal limits  COMPREHENSIVE METABOLIC PANEL - Abnormal; Notable for the following components:   Sodium 130 (*)    CO2 19 (*)    Glucose, Bld 118 (*)    BUN 5 (*)    Calcium 7.9 (*)    Total Protein 6.3 (*)     Albumin 2.5 (*)    Total Bilirubin 4.0 (*)    All other components within normal limits  PROTIME-INR - Abnormal; Notable for the following components:   Prothrombin Time 21.6 (*)    INR 1.9 (*)    All other components within normal limits  ETHANOL - Abnormal; Notable for the following components:   Alcohol, Ethyl (B) 53 (*)    All other components within normal limits  CULTURE, BLOOD (ROUTINE X 2)  CULTURE, BLOOD (ROUTINE X 2)  BODY FLUID CULTURE W GRAM STAIN  LACTATE DEHYDROGENASE, PLEURAL OR PERITONEAL FLUID  GLUCOSE, PLEURAL OR PERITONEAL FLUID  PROTEIN, PLEURAL OR PERITONEAL FLUID  ALBUMIN, PLEURAL OR PERITONEAL FLUID   BODY FLUID CELL COUNT WITH DIFFERENTIAL  AMMONIA  PROTIME-INR  APTT  COMPREHENSIVE METABOLIC PANEL  CBC  PHOSPHORUS  MAGNESIUM  I-STAT CG4 LACTIC ACID, ED  TYPE AND SCREEN    EKG EKG Interpretation Date/Time:  Sunday September 25 2022 18:58:13 EDT Ventricular Rate:  78 PR Interval:  125 QRS Duration:  121 QT Interval:  401 QTC Calculation: 463 R Axis:   11  Text Interpretation: Sinus rhythm Probable left atrial enlargement Nonspecific intraventricular conduction delay Borderline repolarization abnormality No significant change since last tracing Confirmed by Richardean Canal [phone]) on 09/25/2022 7:09:06 PM  Radiology No results found.  Procedures Procedures    Medications Ordered in ED Medications  albumin human 5 % solution 12.5 g (has no administration in time range)  meropenem (MERREM) 1 g in sodium chloride 0.9 % 100 mL IVPB (1 g Intravenous New Bag/Given 09/25/22 2125)  midodrine (PROAMATINE) tablet 10 mg (has no administration in time range)  spironolactone (ALDACTONE) tablet 50 mg (has no administration in time range)  propranolol (INDERAL) tablet 10 mg (has no administration in time range)  ferrous sulfate tablet 325 mg (has no administration in time range)  sodium chloride flush (NS) 0.9 % injection 3 mL (has no administration in time  range)  sodium chloride flush (NS) 0.9 % injection 3 mL (has no administration in time range)  0.9 %  sodium chloride infusion (has no administration in time range)  acetaminophen (TYLENOL) tablet 650 mg (has no administration in time range)    Or  acetaminophen (TYLENOL) suppository 650 mg (has no administration in time range)  albumin human 25 % solution 50 g (has  no administration in time range)  senna-docusate (Senokot-S) tablet 1 tablet (has no administration in time range)  ondansetron (ZOFRAN) tablet 4 mg (has no administration in time range)    Or  ondansetron (ZOFRAN) injection 4 mg (has no administration in time range)  LORazepam (ATIVAN) tablet 1-4 mg (has no administration in time range)    Or  LORazepam (ATIVAN) injection 1-4 mg (has no administration in time range)  thiamine (VITAMIN B1) tablet 100 mg (has no administration in time range)    Or  thiamine (VITAMIN B1) injection 100 mg (has no administration in time range)  folic acid (FOLVITE) tablet 1 mg (has no administration in time range)  multivitamin with minerals tablet 1 tablet (has no administration in time range)  lactulose (CHRONULAC) 10 GM/15ML solution 30 g (has no administration in time range)  morphine (PF) 4 MG/ML injection 4 mg (4 mg Intravenous Given 09/25/22 1932)  lidocaine (XYLOCAINE) 2 % (with pres) injection 200 mg (200 mg Infiltration Given 09/25/22 1938)    ED Course/ Medical Decision Making/ A&P                                 Medical Decision Making Shaquan Kable [name] is a 42 y.o. male here presenting with abdominal distention.  Patient has known cirrhosis and continues to drink alcohol.  Patient appears to have tense ascites on exam.  He also has a history of SBP with ESBL.  Will perform paracentesis emergently and give meropenem empirically  9:28 PM I performed a paracentesis with the resident.  We took out 5 L.  We sent off the fluid for analysis.  Patient received meropenem.  At this  point patient will be admitted for possible SBP   Problems Addressed: Other cirrhosis of liver (HCC): acute illness or injury SBP (spontaneous bacterial peritonitis) (HCC): acute illness or injury  Amount and/or Complexity of Data Reviewed Labs: ordered. Decision-making details documented in ED Course.  Risk Prescription drug management. Decision regarding hospitalization.    Final Clinical Impression(s) / ED Diagnoses Final diagnoses:  SBP (spontaneous bacterial peritonitis) (HCC)  Other cirrhosis of liver [facility])    Rx / DC Orders ED Discharge Orders     None         Charlynne Pander, MD 09/25/22 2129

## 2022-09-25 NOTE — ED Notes (Signed)
ED TO INPATIENT HANDOFF REPORT  ED Nurse Name and Phone #: Amil Amen [phone]  S Name/Age/Gender Mathew [name] 42 y.o. male Room/Bed: 018C/018C  Code Status   Code Status: Full Code  Home/SNF/Other Home Patient oriented to: self, place, time, and situation Is this baseline? Yes   Triage Complete: Triage complete  Chief Complaint Decompensated hepatic cirrhosis (HCC) [K72.90, K74.60]  Triage Note Per [facility] pt coming from home c/o abdominal pain and distention for past few weeks. Abdomen obviously distended. Pt has umbilical hernia. States he has taken medications as prescribed. Patient admits to alcohol use yesterday with none today.    Allergies No Known Allergies  Level of Care/Admitting Diagnosis ED Disposition     ED Disposition  Admit   Condition  --   Comment  Hospital Area: [facility] [[id number]]  Level of Care: Telemetry Medical [[id number]]  May admit patient to [name] or [facility] if equivalent level of care is available:: No  Covid Evaluation: Asymptomatic - no recent exposure (last 10 days) testing not required  Diagnosis: Decompensated hepatic cirrhosis [facility]) [[id number]]  Admitting Physician: Tereasa Coop [[id number]]  Attending Physician: Tereasa Coop [[id number]]  Certification:: I certify this patient will need inpatient services for at least 2 midnights  Expected Medical Readiness: 09/30/2022          B Medical/Surgery History Past Medical History:  Diagnosis Date   Acute metabolic encephalopathy 06/20/2021   Alcohol withdrawal seizure (HCC)    Cirrhosis (HCC)    ETOH abuse    Pneumonia 04/02/2020   SBP (spontaneous bacterial peritonitis) (HCC) 06/17/2021   Past Surgical History:  Procedure Laterality Date   COLONOSCOPY WITH PROPOFOL N/A 03/17/2018   Procedure: COLONOSCOPY WITH PROPOFOL;  Surgeon: Bernette Redbird, MD;  Location: [facility] END[facility]COPY;  Service: Endoscopy;  Laterality: N/A;   ESOPHAGOGASTRODUODENOSCOPY (EGD)  WITH PROPOFOL N/A 03/17/2018   Procedure: ESOPHAGOGASTRODUODENOSCOPY (EGD) WITH PROPOFOL;  Surgeon: Bernette Redbird, MD;  Location: [facility] ENDOSCOPY;  Service: Endoscopy;  Laterality: N/A;   IR PARACENTESIS  03/14/2018   IR PARACENTESIS  11/18/2020   IR PARACENTESIS  06/17/2021   IR PARACENTESIS  08/30/2021   IR PARACENTESIS  09/02/2021   IR PARACENTESIS  11/16/2021   IR PARACENTESIS  03/01/2022   IR PARACENTESIS  05/31/2022   IR PARACENTESIS  06/02/2022   IR PARACENTESIS  06/23/2022   IR PARACENTESIS  07/01/2022   IR PARACENTESIS  08/18/2022     A IV Location/Drains/Wounds Patient Lines/Drains/Airways Status     Active Line/Drains/Airways     Name Placement date Placement time Site Days   Peripheral IV 09/25/22 20 G Right Antecubital 09/25/22  1925  Antecubital  less than 1            Intake/Output Last 24 hours No intake or output data in the 24 hours ending 09/25/22 2132  Labs/Imaging Results for orders placed or performed during the hospital encounter of 09/25/22 (from the past 48 hour(s))  CBC with Differential     Status: Abnormal   Collection Time: 09/25/22  7:36 PM  Result Value Ref Range   WBC 3.8 (L) 4.0 - 10.5 K/uL   RBC 2.86 (L) 4.22 - 5.81 MIL/uL   Hemoglobin 10.4 (L) 13.0 - 17.0 g/dL   HCT 30.1 (L) 60.1 - 09.3 %   MCV 105.6 (H) 80.0 - 100.0 fL   MCH 36.4 (H) 26.0 - 34.0 pg   MCHC 34.4 30.0 - 36.0 g/dL   RDW 23.5 57.3 - 22.0 %  Platelets 47 (L) 150 - 400 K/uL    Comment: SPECIMEN CHECKED FOR CLOTS Immature Platelet Fraction may be clinically indicated, consider ordering this additional test [id number] REPEATED TO VERIFY PLATELET COUNT CONFIRMED BY SMEAR    nRBC 0.0 0.0 - 0.2 %   Neutrophils Relative % 83 %   Neutro Abs 3.2 1.7 - 7.7 K/uL   Lymphocytes Relative 9 %   Lymphs Abs 0.4 (L) 0.7 - 4.0 K/uL   Monocytes Relative 5 %   Monocytes Absolute 0.2 0.1 - 1.0 K/uL   Eosinophils Relative 2 %   Eosinophils Absolute 0.1 0.0 - 0.5 K/uL   Basophils Relative 0 %    Basophils Absolute 0.0 0.0 - 0.1 K/uL   WBC Morphology MORPHOLOGY UNREMARKABLE    RBC Morphology MORPHOLOGY UNREMARKABLE    Smear Review PLATELETS APPEAR DECREASED    Immature Granulocytes 1 %   Abs Immature Granulocytes 0.02 0.00 - 0.07 K/uL    Comment: Performed at [facility], [address] [place], [place] [address]  Comprehensive metabolic panel     Status: Abnormal   Collection Time: 09/25/22  7:36 PM  Result Value Ref Range   Sodium 130 (L) 135 - 145 mmol/L   Potassium 3.9 3.5 - 5.1 mmol/L   Chloride 102 98 - 111 mmol/L   CO2 19 (L) 22 - 32 mmol/L   Glucose, Bld 118 (H) 70 - 99 mg/dL    Comment: Glucose reference range applies only to samples taken after fasting for at least 8 hours.   BUN 5 (L) 6 - 20 mg/dL   Creatinine, Ser 1.19 0.61 - 1.24 mg/dL   Calcium 7.9 (L) 8.9 - 10.3 mg/dL   Total Protein 6.3 (L) 6.5 - 8.1 g/dL   Albumin 2.5 (L) 3.5 - 5.0 g/dL   AST 40 15 - 41 U/L   ALT 19 0 - 44 U/L   Alkaline Phosphatase 122 38 - 126 U/L   Total Bilirubin 4.0 (H) 0.3 - 1.2 mg/dL   GFR, Estimated >14 >78 mL/min    Comment: (NOTE) Calculated using the CKD-EPI Creatinine Equation (2021)    Anion gap 9 5 - 15    Comment: Performed at [facility], [address], [place], [place] [address]  Protime-INR     Status: Abnormal   Collection Time: 09/25/22  7:36 PM  Result Value Ref Range   Prothrombin Time 21.6 (H) 11.4 - 15.2 seconds   INR 1.9 (H) 0.8 - 1.2    Comment: (NOTE) INR goal varies based on device and disease states. Performed at [facility], [address] [place], [place] [address]   Ethanol     Status: Abnormal   Collection Time: 09/25/22  7:36 PM  Result Value Ref Range   Alcohol, Ethyl (B) 53 (H) <10 mg/dL    Comment: (NOTE) Lowest detectable limit for serum alcohol is 10 mg/dL.  For medical purposes only. Performed at [facility], [address], [place], [place] [address]   I-Stat CG4 Lactic Acid     Status: None    Collection Time: 09/25/22  7:40 PM  Result Value Ref Range   Lactic Acid, Venous 1.7 0.5 - 1.9 mmol/L   No results found.  Pending Labs [employer] (From admission, onward)     Start     Ordered   09/26/22 0500  Protime-INR  Tomorrow morning,   R        09/25/22 2123  09/26/22 0500  APTT  Tomorrow morning,   R        09/25/22 2123   09/26/22 0500  Comprehensive metabolic panel  Daily,   R      09/25/22 2123   09/26/22 0500  CBC  Daily,   R      09/25/22 2123   09/25/22 2125  Phosphorus  Add-on,   AD        09/25/22 2124   09/25/22 2124  Ammonia  Add-on,   AD        09/25/22 2123   09/25/22 2124  Type and screen [facility]  Once,   R       Comments: [facility]   Question:  Release to patient  Answer:  Immediate   09/25/22 2123   09/25/22 2124  Magnesium  Add-on,   AD        09/25/22 2124   09/25/22 2115  Body fluid cell count with differential  Add-on,   AD       Question Answer Comment  Are there also cytology or pathology orders on this specimen? No   Release to patient Immediate      09/25/22 2114   09/25/22 1903  Lactate dehydrogenase (pleural or peritoneal fluid)  (Peritoneal fluid analysis panel (pnl))  ONCE - URGENT,   URGENT        09/25/22 1902   09/25/22 1903  Glucose, pleural or peritoneal fluid  (Peritoneal fluid analysis panel (pnl))  ONCE - URGENT,   URGENT        09/25/22 1902   09/25/22 1903  Protein, pleural or peritoneal fluid  (Peritoneal fluid analysis panel (pnl))  ONCE - URGENT,   URGENT        09/25/22 1902   09/25/22 1903  Albumin, pleural or peritoneal fluid   (Peritoneal fluid analysis panel (pnl))  ONCE - URGENT,   URGENT        09/25/22 1902   09/25/22 1903  Body fluid culture w Gram Stain  (Peritoneal fluid analysis panel (pnl))  ONCE - URGENT,   URGENT       Question:  Are there also cytology or pathology orders on this specimen?  Answer:  No   09/25/22 1902   09/25/22 1902  Blood culture (routine x 2)   BLOOD CULTURE X 2,   R (with STAT occurrences)      09/25/22 1901            Vitals/Pain Today's Vitals   09/25/22 2000 09/25/22 2014 09/25/22 2015 09/25/22 2030  BP: 117/67  132/69 (!) 133/57  Pulse: 83  88 84  Resp: 16     Temp:      TempSrc:      SpO2: 100%  100% 100%  Weight:      Height:      PainSc:  7       Isolation Precautions No active isolations  Medications Medications  albumin human 5 % solution 12.5 g (has no administration in time range)  meropenem (MERREM) 1 g in sodium chloride 0.9 % 100 mL IVPB (1 g Intravenous New Bag/Given 09/25/22 2125)  midodrine (PROAMATINE) tablet 10 mg (has no administration in time range)  spironolactone (ALDACTONE) tablet 50 mg (has no administration in time range)  propranolol (INDERAL) tablet 10 mg (has no administration in time range)  ferrous sulfate tablet 325 mg (has no administration in time range)  sodium chloride flush (NS) 0.9 % injection  3 mL (has no administration in time range)  sodium chloride flush (NS) 0.9 % injection 3 mL (has no administration in time range)  0.9 %  sodium chloride infusion (has no administration in time range)  acetaminophen (TYLENOL) tablet 650 mg (has no administration in time range)    Or  acetaminophen (TYLENOL) suppository 650 mg (has no administration in time range)  albumin human 25 % solution 50 g (has no administration in time range)  senna-docusate (Senokot-S) tablet 1 tablet (has no administration in time range)  ondansetron (ZOFRAN) tablet 4 mg (has no administration in time range)    Or  ondansetron (ZOFRAN) injection 4 mg (has no administration in time range)  LORazepam (ATIVAN) tablet 1-4 mg (has no administration in time range)    Or  LORazepam (ATIVAN) injection 1-4 mg (has no administration in time range)  thiamine (VITAMIN B1) tablet 100 mg (has no administration in time range)    Or  thiamine (VITAMIN B1) injection 100 mg (has no administration in time range)  folic  acid (FOLVITE) tablet 1 mg (has no administration in time range)  multivitamin with minerals tablet 1 tablet (has no administration in time range)  lactulose (CHRONULAC) 10 GM/15ML solution 30 g (has no administration in time range)  morphine (PF) 4 MG/ML injection 4 mg (4 mg Intravenous Given 09/25/22 1932)  lidocaine (XYLOCAINE) 2 % (with pres) injection 200 mg (200 mg Infiltration Given 09/25/22 1938)    Mobility walks     Focused Assessments Abdominal pain    R Recommendations: See Admitting Provider Note  Report given to:   Additional Notes: Paracentesis complete, meropenem running, awaiting albumin from phamacy

## 2022-09-26 ENCOUNTER — Inpatient Hospital Stay (HOSPITAL_COMMUNITY): Payer: MEDICAID

## 2022-09-26 HISTORY — PX: IR PARACENTESIS: IMG2679

## 2022-09-26 LAB — PATHOLOGIST SMEAR REVIEW

## 2022-09-26 LAB — TYPE AND SCREEN
ABO/RH(D): O POS
Antibody Screen: NEGATIVE

## 2022-09-26 LAB — BLOOD CULTURE ID PANEL (REFLEXED) - BCID2

## 2022-09-26 LAB — CBC
HCT: 23.2 % — ABNORMAL LOW (ref 39.0–52.0)
HCT: 24.1 % — ABNORMAL LOW (ref 39.0–52.0)
Hemoglobin: 7.9 g/dL — ABNORMAL LOW (ref 13.0–17.0)
Hemoglobin: 8.5 g/dL — ABNORMAL LOW (ref 13.0–17.0)
MCH: 35.7 pg — ABNORMAL HIGH (ref 26.0–34.0)
MCH: 37.4 pg — ABNORMAL HIGH (ref 26.0–34.0)
MCHC: 34.1 g/dL (ref 30.0–36.0)
MCHC: 35.3 g/dL (ref 30.0–36.0)
MCV: 105 fL — ABNORMAL HIGH (ref 80.0–100.0)
MCV: 106.2 fL — ABNORMAL HIGH (ref 80.0–100.0)
Platelets: 31 10*3/uL — ABNORMAL LOW (ref 150–400)
Platelets: 29 10*3/uL — CL (ref 150–400)
RBC: 2.21 MIL/uL — ABNORMAL LOW (ref 4.22–5.81)
RBC: 2.27 MIL/uL — ABNORMAL LOW (ref 4.22–5.81)
RDW: 14.4 % (ref 11.5–15.5)
RDW: 14.6 % (ref 11.5–15.5)
WBC: 3.9 10*3/uL — ABNORMAL LOW (ref 4.0–10.5)
WBC: 6.8 10*3/uL (ref 4.0–10.5)
nRBC: 0.5 % — ABNORMAL HIGH (ref 0.0–0.2)
nRBC: 0 % (ref 0.0–0.2)

## 2022-09-26 LAB — COMPREHENSIVE METABOLIC PANEL
ALT: 13 U/L (ref 0–44)
AST: 28 U/L (ref 15–41)
Albumin: 2 g/dL — ABNORMAL LOW (ref 3.5–5.0)
Alkaline Phosphatase: 81 U/L (ref 38–126)
Anion gap: 7 (ref 5–15)
BUN: 6 mg/dL (ref 6–20)
CO2: 21 mmol/L — ABNORMAL LOW (ref 22–32)
Calcium: 7.6 mg/dL — ABNORMAL LOW (ref 8.9–10.3)
Chloride: 104 mmol/L (ref 98–111)
Creatinine, Ser: 1.13 mg/dL (ref 0.61–1.24)
GFR, Estimated: >60 mL/min (ref >60)
Glucose, Bld: 91 mg/dL (ref 70–99)
Potassium: 3.9 mmol/L (ref 3.5–5.1)
Sodium: 132 mmol/L — ABNORMAL LOW (ref 135–145)
Total Bilirubin: 4.7 mg/dL — ABNORMAL HIGH (ref 0.3–1.2)
Total Protein: 5.1 g/dL — ABNORMAL LOW (ref 6.5–8.1)

## 2022-09-26 LAB — BODY FLUID CELL COUNT WITH DIFFERENTIAL
Eos, Fluid: 0 %
Lymphs, Fluid: 40 %
Monocyte-Macrophage-Serous Fluid: 25 % — ABNORMAL LOW (ref 50–90)
Neutrophil Count, Fluid: 35 % — ABNORMAL HIGH (ref 0–25)
Total Nucleated Cell Count, Fluid: 64 uL (ref 0–1000)

## 2022-09-26 LAB — PROTIME-INR
INR: 2.4 — ABNORMAL HIGH (ref 0.8–1.2)
Prothrombin Time: 26.3 s — ABNORMAL HIGH (ref 11.4–15.2)

## 2022-09-26 LAB — APTT: aPTT: 42 s — ABNORMAL HIGH (ref 24–36)

## 2022-09-26 LAB — AMMONIA: Ammonia: 28 umol/L (ref 9–35)

## 2022-09-26 MED ORDER — SODIUM CHLORIDE 0.9 % IV SOLN
1.0000 g | Freq: Three times a day (TID) | INTRAVENOUS | Status: DC
  Administered 2022-09-26 – 2022-09-28 (×6): 1 g via INTRAVENOUS
  Filled 2022-09-26 (×8): qty 20

## 2022-09-26 MED ORDER — VITAMIN K1 10 MG/ML IJ SOLN: 10.0000 mg | Freq: Once | INTRAMUSCULAR | Status: DC

## 2022-09-26 MED ORDER — ALBUMIN HUMAN 25 % IV SOLN
50.0000 g | Freq: Every day | INTRAVENOUS | Status: AC
  Administered 2022-09-26 – 2022-09-27 (×2): 50 g via INTRAVENOUS
  Filled 2022-09-26 (×2): qty 200

## 2022-09-26 MED ORDER — VITAMIN K1 10 MG/ML IJ SOLN
10.0000 mg | Freq: Once | INTRAVENOUS | Status: AC
  Administered 2022-09-26: 10 mg via INTRAVENOUS
  Filled 2022-09-26 (×2): qty 1

## 2022-09-26 MED ORDER — LIDOCAINE HCL 1 % IJ SOLN
INTRAMUSCULAR | Status: AC
  Filled 2022-09-26: qty 20

## 2022-09-26 MED ORDER — SULFAMETHOXAZOLE-TRIMETHOPRIM 800-160 MG PO TABS: 1.0000 | ORAL_TABLET | Freq: Every day | ORAL | Status: DC

## 2022-09-26 MED ORDER — IOHEXOL 350 MG/ML SOLN
75.0000 mL | Freq: Once | INTRAVENOUS | Status: AC | PRN
  Administered 2022-09-26: 75 mL via INTRAVENOUS

## 2022-09-26 NOTE — Progress Notes (Signed)
Pt arrived back to 6 north room 17 alert and oriented. Bed in lowest position. Call light in reach. Meal tray set up. Will continue to monitor pt.

## 2022-09-26 NOTE — Procedures (Signed)
Ultrasound-guided therapeutic paracentesis performed yielding 3.8 liters of straw colored fluid.  No immediate complications. EBL is < 2 ml.

## 2022-09-26 NOTE — Progress Notes (Addendum)
  I have obtained CT abdomen pelvis earlier tonight as patient was complaining about abdominal pain. CT ab pelvis ruled out any bowel obstruction.  It showed cirrhosis with large volume simple ascites, cholelithiasis and a small left pleural effusion.  -Given small bowel obstruction has been ruled out resuming heart healthy diet with fluid restriction 2 L/day and salt restriction 2 g/day.  Tereasa Coop, MD [facility] 09/26/2022, 6:50 AM

## 2022-09-26 NOTE — Progress Notes (Signed)
   09/26/22 0816  Assess: MEWS Score  Temp 98.2 F (36.8 C)  BP (!) 80/47  MAP (mmHg) (!) 58  Pulse Rate 89  Resp 17  Level of Consciousness Alert  SpO2 100 %  O2 Device Room Air  Assess: MEWS Score  MEWS Temp 0  MEWS Systolic 2  MEWS Pulse 0  MEWS RR 0  MEWS LOC 0  MEWS Score 2  MEWS Score Color Yellow  Assess: if the MEWS score is Yellow or Red  Were vital signs accurate and taken at a resting state? Yes  Does the patient meet 2 or more of the SIRS criteria? No  MEWS guidelines implemented  Yes, yellow  Treat  MEWS Interventions Considered administering scheduled or prn medications/treatments as ordered  Take Vital Signs  Increase Vital Sign Frequency  Yellow: Q2hr x1, continue Q4hrs until patient remains green for 12hrs  Escalate  MEWS: Escalate Yellow: Discuss with charge nurse and consider notifying provider and/or RRT  Notify: Charge Nurse/RN  Name of Charge Nurse/RN Notified Marchelle Folks, RN  Provider Notification  Provider Name/Title Gherghe,MD  Date Provider Notified 09/26/22  Time Provider Notified 0825  Method of Notification Face-to-face  Notification Reason Other (Comment) (Yellow MEWS)  Provider response At bedside;See new orders  Date of Provider Response 09/26/22  Time of Provider Response 0828  Assess: SIRS CRITERIA  SIRS Temperature  0  SIRS Pulse 0  SIRS Respirations  0  SIRS WBC 0  SIRS Score Sum  0

## 2022-09-26 NOTE — TOC Initial Note (Signed)
Transition of Care (TOC) - Initial/Assessment Note   Spoke to patient at bedside via [employer] interpreter [place] [phone]  . Patient from home with Aunt , has transportation home and to appointments.   Referral sent to financial counselor.   Patient would like to use [facility].   PCP is Dr Shan Levans  Patient Details  Name: Mathew [name] MRN: [id number] Date of Birth: 29-Jun-1980  Transition of Care [facility]) CM/SW Contact:    Kingsley Plan, RN Phone Number: 09/26/2022, 2:44 PM  Clinical Narrative:                   Expected Discharge Plan: Home/Self Care Barriers to Discharge: Continued Medical Work up   Patient Goals and CMS Choice Patient states their goals for this hospitalization and ongoing recovery are:: to return to home          Expected Discharge Plan and Services In-house Referral: Financial Counselor Discharge Planning Services: CM Consult Post Acute Care Choice: NA Living arrangements for the past 2 months: Single Family Home                                      Prior Living Arrangements/Services Living arrangements for the past 2 months: Single Family Home Lives with:: Other (Comment) (aunt) Patient language and need for interpreter reviewed:: Yes Do you feel safe going back to the place where you live?: Yes      Need for Family Participation in Patient Care: Yes (Comment) Care giver support system in place?: Yes (comment)   Criminal Activity/Legal Involvement Pertinent to Current Situation/Hospitalization: No - Comment as needed  Activities of Daily Living      Permission Sought/Granted   Permission granted to share information with : No              Emotional Assessment Appearance:: Appears stated age Attitude/Demeanor/Rapport: Engaged Affect (typically observed): Accepting Orientation: : Oriented to Self, Oriented to Place, Oriented to  Time, Oriented to Situation Alcohol / Substance Use: Not Applicable Psych  Involvement: No (comment)  Admission diagnosis:  SBP (spontaneous bacterial peritonitis) (HCC) [K65.2] Other cirrhosis of liver (HCC) [K74.69] Decompensated hepatic cirrhosis (HCC) [K72.90, K74.60] Patient Active Problem List   Diagnosis Date Noted   H/O spontaneous bacterial peritonitis 09/25/2022   Coagulopathy (HCC) 09/25/2022   Metabolic acidosis 09/25/2022   Spontaneous bacterial peritonitis (HCC) 05/31/2022   Alcoholic hepatitis 05/31/2022   GERD (gastroesophageal reflux disease) 05/31/2022   Obesity (BMI 30-39.9) 05/31/2022   Decompensated hepatic cirrhosis (HCC) 03/01/2022   Macrocytic anemia 02/19/2022   Uninsured 11/16/2021   Thrombocytopenia (HCC) 11/15/2021   Alcohol use disorder 09/02/2021   Cirrhosis of liver (HCC) 09/01/2021   Pancytopenia (HCC)    Tobacco abuse    SBP (spontaneous bacterial peritonitis) (HCC) s/t Decompensated Hepatic Cirrhosis 06/17/2021   Umbilical hernia 11/26/2020   Vitamin D deficiency 02/03/2020   Edema due to hypoalbuminemia 12/20/2019   Alcoholic cirrhosis of liver with ascites (HCC) 03/13/2018   Hypokalemia 07/09/2016   PCP:  [name], MD Pharmacy:   [facility] [phone] Ginette Otto, [place] - [address] AT [facility] [address] [facility] [address] [place] [address] Phone: [phone] Fax: [phone]  [facility] [address] [place] [address] Phone: [phone] Fax: [phone]  [facility] - [place],  [place] - [address] [place] [address] Phone: [phone] Fax: [phone]  [name] [address] [place] [address] Phone: [phone] Fax: [phone]     Social Determinants of Health (SDOH) Social History: SDOH Screenings   Food Insecurity: No Food Insecurity (02/19/2022)  Housing: Low Risk  (02/19/2022)  Transportation  Needs: No Transportation Needs (02/19/2022)  Utilities: Not At Risk (02/19/2022)  Depression (PHQ2-9): High Risk (08/17/2022)  Tobacco Use: High Risk (09/25/2022)   SDOH Interventions:     Readmission Risk Interventions    06/06/2022   11:59 AM  Readmission Risk Prevention Plan  Transportation Screening Complete  PCP or Specialist Appt within 3-5 Days Complete  HRI or Home Care Consult Complete  Social Work Consult for Recovery Care Planning/Counseling Complete  Palliative Care Screening Complete  Medication Review Oceanographer) Complete

## 2022-09-26 NOTE — Progress Notes (Signed)
PHARMACY - PHYSICIAN COMMUNICATION CRITICAL VALUE ALERT - BLOOD CULTURE IDENTIFICATION (BCID)  Mathew [name] is an 42 y.o. male who presented to [facility] on 09/25/2022 with a chief complaint of abdominal pain and distention for the past few days.  Assessment:  E. Coli 2/2 in blood. Possible intraabdominal source. No resistance genes detected, however, patient with history of ESBL E. Coli in peritoneal fluid in May 2024.  Name of physician (or Provider) Contacted: Pamella Pert, MD  Current antibiotics: PO Bactrim  Changes to prescribed antibiotics recommended: Meropenem 1 g IV q8h Recommendations accepted by provider  Results for orders placed or performed during the hospital encounter of 09/25/22  Blood Culture ID Panel (Reflexed) (Collected: 09/25/2022  7:36 PM)  Result Value Ref Range   Enterococcus faecalis NOT DETECTED NOT DETECTED   Enterococcus Faecium NOT DETECTED NOT DETECTED   Listeria monocytogenes NOT DETECTED NOT DETECTED   Staphylococcus species NOT DETECTED NOT DETECTED   Staphylococcus aureus (BCID) NOT DETECTED NOT DETECTED   Staphylococcus epidermidis NOT DETECTED NOT DETECTED   Staphylococcus lugdunensis NOT DETECTED NOT DETECTED   Streptococcus species NOT DETECTED NOT DETECTED   Streptococcus agalactiae NOT DETECTED NOT DETECTED   Streptococcus pneumoniae NOT DETECTED NOT DETECTED   Streptococcus pyogenes NOT DETECTED NOT DETECTED   A.calcoaceticus-baumannii NOT DETECTED NOT DETECTED   Bacteroides fragilis NOT DETECTED NOT DETECTED   Enterobacterales DETECTED (A) NOT DETECTED   Enterobacter cloacae complex NOT DETECTED NOT DETECTED   Escherichia coli DETECTED (A) NOT DETECTED   Klebsiella aerogenes NOT DETECTED NOT DETECTED   Klebsiella oxytoca NOT DETECTED NOT DETECTED   Klebsiella pneumoniae NOT DETECTED NOT DETECTED   Proteus species NOT DETECTED NOT DETECTED   Salmonella species NOT DETECTED NOT DETECTED   Serratia marcescens NOT DETECTED  NOT DETECTED   Haemophilus influenzae NOT DETECTED NOT DETECTED   Neisseria meningitidis NOT DETECTED NOT DETECTED   Pseudomonas aeruginosa NOT DETECTED NOT DETECTED   Stenotrophomonas maltophilia NOT DETECTED NOT DETECTED   Candida albicans NOT DETECTED NOT DETECTED   Candida auris NOT DETECTED NOT DETECTED   Candida glabrata NOT DETECTED NOT DETECTED   Candida krusei NOT DETECTED NOT DETECTED   Candida parapsilosis NOT DETECTED NOT DETECTED   Candida tropicalis NOT DETECTED NOT DETECTED   Cryptococcus neoformans/gattii NOT DETECTED NOT DETECTED   CTX-M ESBL NOT DETECTED NOT DETECTED   Carbapenem resistance IMP NOT DETECTED NOT DETECTED   Carbapenem resistance KPC NOT DETECTED NOT DETECTED   Carbapenem resistance NDM NOT DETECTED NOT DETECTED   Carbapenem resist OXA 48 LIKE NOT DETECTED NOT DETECTED   Carbapenem resistance VIM NOT DETECTED NOT DETECTED    Lora Paula, PharmD PGY-2 Infectious Diseases Pharmacy Resident 09/26/2022 10:40 AM

## 2022-09-26 NOTE — Progress Notes (Addendum)
PROGRESS NOTE  Mathew [name] OZH:[id number] DOB: [birth year] DOA: 09/25/2022 PCP: [name], MD   LOS: 1 day   Brief Narrative / Interim history: This is a 42 year old male with history of decompensated alcoholic cirrhosis, ongoing EtOH use, history of SBP due to ESBL, chronic pancytopenia comes into the hospital with abdominal pain and distention for the past few days.  Last alcoholic drink was 9/14.  He reported to the admitting MD that he ran out of medication but denies that for me.  In the ED received paracentesis and 5 L were removed  Subjective / 24h Interval events: He is feeling better after the paracentesis yesterday.  Still feels swollen and asking for the fluid that remained to be removed.  States that his abdominal pain has resolved  Assesement and Plan: Principal Problem:   Decompensated hepatic cirrhosis (HCC) Active Problems:   Alcoholic cirrhosis of liver with ascites (HCC)   Alcohol use disorder   H/O spontaneous bacterial peritonitis   Pancytopenia (HCC)   Macrocytic anemia   Umbilical hernia   GERD (gastroesophageal reflux disease)   Tobacco abuse   Thrombocytopenia (HCC)   Coagulopathy (HCC)   Metabolic acidosis   Principal problem Abdominal pain, due to ascites-patient had 5 L removed in the emergency room.  Fortunately fluid studies do not show evidence of bacterial infection with only 64 cells. -Gram stain without organisms and culture remaining negative -Received albumin last night and again this morning  Active problems E. coli bacteremia -blood cultures speciated E. coli just now, he is on meropenem given history of ESBL, continue  Decompensated liver cirrhosis -very poor prognosis given the fact that he continues to drink.  This was extensively discussed with the patient this morning, yet he did not commit that he is ready to quit alcohol.  He tells me that he drinks because it relaxes him  Pancytopenia -due to advanced liver  disease.  He is also iron deficient, continue home iron.  Hemoglobin was 10.4 yesterday and 7.9 this morning, however he appears be in the 8-9 range.  No bleeding  Coagulopathy-INR elevated in the 2 range today.  Will give vitamin K  Hyponatremia-likely in the setting of decompensated cirrhosis  History of ESBL associated SBP in May 2024-noted, now with concern for bacteremia.  Continue meropenem  Hypoalbuminemia-due to cirrhosis.  Received albumin last night and again this morning  Umbilical hernia - Patient has history of nonreducible umbilical hernia.  He is asking today if repair is an option.  Currently he is not a surgical candidate   Chronic alcohol use disorder -continue CIWA.  Alcohol level on admission elevated at 53.  Counseled for cessation   Scheduled Meds:  ferrous sulfate  325 mg Oral Q breakfast   folic acid  1 mg Oral Daily   furosemide  20 mg Oral Daily   lactulose  30 g Oral Q6H   multivitamin with minerals  1 tablet Oral Daily   pantoprazole  40 mg Oral Daily   sodium bicarbonate  650 mg Oral BID   sodium chloride flush  3 mL Intravenous Q12H   spironolactone  50 mg Oral Daily   thiamine  100 mg Oral Daily   Or   thiamine  100 mg Intravenous Daily   Continuous Infusions:  sodium chloride     albumin human 50 g (09/26/22 0822)   meropenem (MERREM) IV 200 mL/hr at 09/26/22 0602   phytonadione (VITAMIN K) 10 mg in dextrose 5 % 50 mL  IVPB     PRN Meds:.sodium chloride, acetaminophen **OR** acetaminophen, LORazepam **OR** LORazepam, morphine injection, ondansetron **OR** ondansetron (ZOFRAN) IV, senna-docusate, sodium chloride flush  Current Outpatient Medications  Medication Instructions   FeroSul 325 mg, Oral, Daily with breakfast   folic acid (FOLVITE) 1 mg, Oral, Daily   furosemide (LASIX) 20 mg, Oral, Daily   lactulose (CHRONULAC) 30 g, Oral, Every 6 hours   midodrine (PROAMATINE) 10 mg, Oral, 3 times daily with meals   pantoprazole (PROTONIX) 40 mg,  Oral, Daily   propranolol (INDERAL) 10 mg, Oral, 2 times daily   sodium bicarbonate 650 mg, Oral, 2 times daily   spironolactone (ALDACTONE) 50 mg, Oral, Daily   sulfamethoxazole-trimethoprim (BACTRIM DS) 800-160 MG tablet 1 tablet, Oral, Daily    Diet Orders (From admission, onward)     Start     Ordered   09/26/22 0651  Diet Heart Room service appropriate? Yes; Fluid consistency: Thin; Fluid restriction: 2000 mL Fluid  Diet effective now       Comments: Salt restriction 2 g/day  Question Answer Comment  Room service appropriate? Yes   Fluid consistency: Thin   Fluid restriction: 2000 mL Fluid      09/26/22 0650            DVT prophylaxis: SCDs Start: 09/25/22 2124 Place TED hose Start: 09/25/22 2124   Lab Results  Component Value Date   PLT 31 (L) 09/26/2022      Code Status: Full Code  Family Communication: no family at bedside   Status is: Inpatient Remains inpatient appropriate because: severity of illness  Level of care: Telemetry Medical  Consultants:  none  Objective: Vitals:   09/25/22 2300 09/26/22 0600 09/26/22 0816 09/26/22 0900  BP: (!) 140/77 (!) 97/56 (!) 80/47 (!) 91/53  Pulse: 98 93 89   Resp: 20 18 17    Temp: 99.8 F (37.7 C) 98.8 F (37.1 C) 98.2 F (36.8 C)   TempSrc: Oral Oral Oral   SpO2: 98% 99% 100%   Weight:      Height:        Intake/Output Summary (Last 24 hours) at 09/26/2022 1019 Last data filed at 09/26/2022 0602 Gross per 24 hour  Intake 100 ml  Output --  Net 100 ml   Wt Readings from Last 3 Encounters:  09/25/22 77.1 kg  08/17/22 76.7 kg  07/13/22 73.2 kg    Examination:  Constitutional: NAD Eyes: no scleral icterus ENMT: Mucous membranes are moist.  Neck: normal, supple Respiratory: clear to auscultation bilaterally, no wheezing, no crackles. Normal respiratory effort. No accessory muscle use.  Cardiovascular: Regular rate and rhythm, no murmurs / rubs / gallops. No LE edema.  Abdomen: Distention  present but soft, nontender Musculoskeletal: no clubbing / cyanosis.  Skin: no rashes Neurologic: non focal, no asterixis   Data Reviewed: I have independently reviewed following labs and imaging studies   CBC Recent Labs  Lab 09/25/22 1936 09/26/22 0728  WBC 3.8* 3.9*  HGB 10.4* 7.9*  HCT 30.2* 23.2*  PLT 47* 31*  MCV 105.6* 105.0*  MCH 36.4* 35.7*  MCHC 34.4 34.1  RDW 14.1 14.4  LYMPHSABS 0.4*  --   MONOABS 0.2  --   EOSABS 0.1  --   BASOSABS 0.0  --     Recent Labs  Lab 09/25/22 1936 09/25/22 1940 09/26/22 0305  NA 130*  --  132*  K 3.9  --  3.9  CL 102  --  104  CO2 19*  --  21*  GLUCOSE 118*  --  91  BUN 5*  --  6  CREATININE 1.00  --  1.13  CALCIUM 7.9*  --  7.6*  AST 40  --  28  ALT 19  --  13  ALKPHOS 122  --  81  BILITOT 4.0*  --  4.7*  ALBUMIN 2.5*  --  2.0*  MG 1.7  --   --   LATICACIDVEN  --  1.7  --   INR 1.9*  --  2.4*  AMMONIA  --   --  28    ------------------------------------------------------------------------------------------------------------------ No results for input(s): "CHOL", "HDL", "LDLCALC", "TRIG", "CHOLHDL", "LDLDIRECT" in the last 72 hours.  Lab Results  Component Value Date   HGBA1C 4.7 (L) 10/14/2019   ------------------------------------------------------------------------------------------------------------------ No results for input(s): "TSH", "T4TOTAL", "T3FREE", "THYROIDAB" in the last 72 hours.  Invalid input(s): "FREET3"  Cardiac Enzymes No results for input(s): "CKMB", "TROPONINI", "MYOGLOBIN" in the last 168 hours.  Invalid input(s): "CK" ------------------------------------------------------------------------------------------------------------------    Component Value Date/Time   BNP 34.3 02/28/2022 1240    CBG: No results for input(s): "GLUCAP" in the last 168 hours.  Recent Results (from the past 240 hour(s))  Blood culture (routine x 2)     Status: None (Preliminary result)   Collection Time:  09/25/22  7:36 PM   Specimen: BLOOD RIGHT ARM  Result Value Ref Range Status   Specimen Description BLOOD RIGHT ARM  Final   Special Requests   Final    BOTTLES DRAWN AEROBIC AND ANAEROBIC Blood Culture results may not be optimal due to an inadequate volume of blood received in culture bottles   Culture  Setup Time   Final    GRAM NEGATIVE RODS IN BOTH AEROBIC AND ANAEROBIC BOTTLES Organism ID to follow Performed at [facility], [address] [place], [place] [address]    Culture GRAM NEGATIVE RODS  Final   Report Status PENDING  Incomplete  Blood culture (routine x 2)     Status: None (Preliminary result)   Collection Time: 09/25/22  7:54 PM   Specimen: BLOOD LEFT FOREARM  Result Value Ref Range Status   Specimen Description BLOOD LEFT FOREARM  Final   Special Requests   Final    BOTTLES DRAWN AEROBIC AND ANAEROBIC Blood Culture adequate volume   Culture  Setup Time   Final    GRAM NEGATIVE RODS IN BOTH AEROBIC AND ANAEROBIC BOTTLES Performed at [facility], [address], [place], [place] [address]    Culture GRAM NEGATIVE RODS  Final   Report Status PENDING  Incomplete  Culture, body fluid w Gram Stain-bottle     Status: None (Preliminary result)   Collection Time: 09/25/22  8:51 PM   Specimen: Fluid  Result Value Ref Range Status   Specimen Description FLUID PERITONEAL  Final   Special Requests   Final    BOTTLES DRAWN AEROBIC AND ANAEROBIC Blood Culture adequate volume   Culture   Final    NO GROWTH < 12 HOURS Performed at [facility], [address] [place], [place] [address]    Report Status PENDING  Incomplete  Gram stain     Status: None   Collection Time: 09/25/22  8:51 PM   Specimen: Fluid  Result Value Ref Range Status   Specimen Description FLUID PERITONEAL  Final   Special Requests NONE  Final   Gram Stain   Final    RARE WBC PRESENT, PREDOMINANTLY MONONUCLEAR NO  ORGANISMS SEEN Performed at [facility], [address] [place], [place] [address]    Report Status 09/25/2022 FINAL  Final     Radiology Studies: CT ABDOMEN PELVIS W CONTRAST  Result Date: 09/26/2022 CLINICAL DATA:  Cul hernia and concern for bowel obstruction. EXAM: CT ABDOMEN AND PELVIS WITH CONTRAST TECHNIQUE: Multidetector CT imaging of the abdomen and pelvis was performed using the standard protocol following bolus administration of intravenous contrast. RADIATION DOSE REDUCTION: This exam was performed according to the departmental dose-optimization program which includes automated exposure control, adjustment of the mA and/or kV according to patient size and/or use of iterative reconstruction technique. CONTRAST:  75mL OMNIPAQUE IOHEXOL 350 MG/ML SOLN COMPARISON:  06/23/2022 FINDINGS: Lower chest: Coronary atherosclerosis. Small left pleural effusion. Hepatobiliary: Cirrhosis. No focal liver abnormality.Cholelithiasis. No evidence of biliary inflammation. No ductal dilatation. Pancreas: Unremarkable. Spleen: Modest enlargement of the spleen in the setting of portal hypertension. Adrenals/Urinary Tract: Negative adrenals. No hydronephrosis or stone. Thick walled bladder accentuated by incomplete distension. No focal or acute abnormality. Stomach/Bowel: No obstruction. No visible bowel inflammation. Mild edematous thickening of the proximal colon attributed to portal hypertension, stable. Vascular/Lymphatic: No acute vascular abnormality. No mass or adenopathy. Reproductive:No pathologic findings. Other: Large volume of simple ascites. Musculoskeletal: No acute abnormalities. IMPRESSION: 1. Cirrhosis with large volume simple ascites. 2. No bowel obstruction. The umbilical hernia contains ascitic fluid. 3. Cholelithiasis. 4. Small left pleural effusion. Electronically Signed   By: Tiburcio Pea M.D.   On: 09/26/2022 05:40     Pamella Pert, MD, PhD [facility]  Between 7 am - 7 pm I am available, please contact me via Amion (for emergencies)  or Securechat (non urgent messages)  Between 7 pm - 7 am I am not available, please contact night coverage MD/APP via Amion

## 2022-09-26 NOTE — Progress Notes (Signed)
   09/26/22 1414  OTHER  Substance Abuse Education Offered Yes  Substance abuse interventions Patient Counseling  (CAGE-AID) Substance Abuse Screening Tool  Have You Ever Felt You Ought to Cut Down on Your Drinking or Drug Use? 1  Have People Annoyed You By Critizing Your Drinking Or Drug Use? 1  Have You Felt Bad Or Guilty About Your Drinking Or Drug Use? 1  Have You Ever Had a Drink or Used Drugs First Thing In The Morning to Steady Your Nerves or to Get Rid of a Hangover? 0  CAGE-AID Score 3   Resources added to AVS

## 2022-09-26 NOTE — Progress Notes (Signed)
Pt transported to radiology for paracentesis via bed by transportation staff.

## 2022-09-27 DIAGNOSIS — B9620 Unspecified Escherichia coli [E. coli] as the cause of diseases classified elsewhere: Secondary | ICD-10-CM

## 2022-09-27 DIAGNOSIS — R7881 Bacteremia: Secondary | ICD-10-CM

## 2022-09-27 LAB — COMPREHENSIVE METABOLIC PANEL
ALT: 12 U/L (ref 0–44)
AST: 23 U/L (ref 15–41)
Albumin: 2.1 g/dL — ABNORMAL LOW (ref 3.5–5.0)
Alkaline Phosphatase: 68 U/L (ref 38–126)
Anion gap: 6 (ref 5–15)
BUN: 13 mg/dL (ref 6–20)
CO2: 20 mmol/L — ABNORMAL LOW (ref 22–32)
Calcium: 7.3 mg/dL — ABNORMAL LOW (ref 8.9–10.3)
Chloride: 105 mmol/L (ref 98–111)
Creatinine, Ser: 1.12 mg/dL (ref 0.61–1.24)
GFR, Estimated: >60 mL/min (ref >60)
Glucose, Bld: 128 mg/dL — ABNORMAL HIGH (ref 70–99)
Potassium: 3.4 mmol/L — ABNORMAL LOW (ref 3.5–5.1)
Sodium: 131 mmol/L — ABNORMAL LOW (ref 135–145)
Total Bilirubin: 3.2 mg/dL — ABNORMAL HIGH (ref 0.3–1.2)
Total Protein: 4.7 g/dL — ABNORMAL LOW (ref 6.5–8.1)

## 2022-09-27 LAB — CBC
HCT: 23.4 % — ABNORMAL LOW (ref 39.0–52.0)
HCT: 24.8 % — ABNORMAL LOW (ref 39.0–52.0)
HCT: 22.5 % — ABNORMAL LOW (ref 39.0–52.0)
Hemoglobin: 8.1 g/dL — ABNORMAL LOW (ref 13.0–17.0)
Hemoglobin: 8.4 g/dL — ABNORMAL LOW (ref 13.0–17.0)
Hemoglobin: 7.8 g/dL — ABNORMAL LOW (ref 13.0–17.0)
MCH: 36.7 pg — ABNORMAL HIGH (ref 26.0–34.0)
MCH: 36.4 pg — ABNORMAL HIGH (ref 26.0–34.0)
MCH: 36.1 pg — ABNORMAL HIGH (ref 26.0–34.0)
MCHC: 34.6 g/dL (ref 30.0–36.0)
MCHC: 33.9 g/dL (ref 30.0–36.0)
MCHC: 34.7 g/dL (ref 30.0–36.0)
MCV: 105.9 fL — ABNORMAL HIGH (ref 80.0–100.0)
MCV: 107.4 fL — ABNORMAL HIGH (ref 80.0–100.0)
MCV: 104.2 fL — ABNORMAL HIGH (ref 80.0–100.0)
Platelets: 29 10*3/uL — CL (ref 150–400)
Platelets: 29 10*3/uL — CL (ref 150–400)
Platelets: 31 10*3/uL — ABNORMAL LOW (ref 150–400)
RBC: 2.21 MIL/uL — ABNORMAL LOW (ref 4.22–5.81)
RBC: 2.31 MIL/uL — ABNORMAL LOW (ref 4.22–5.81)
RBC: 2.16 MIL/uL — ABNORMAL LOW (ref 4.22–5.81)
RDW: 14.5 % (ref 11.5–15.5)
RDW: 14.6 % (ref 11.5–15.5)
RDW: 14.4 % (ref 11.5–15.5)
WBC: 6.5 10*3/uL (ref 4.0–10.5)
WBC: 5.5 10*3/uL (ref 4.0–10.5)
WBC: 4.4 10*3/uL (ref 4.0–10.5)
nRBC: 0 % (ref 0.0–0.2)
nRBC: 0 % (ref 0.0–0.2)
nRBC: 0 % (ref 0.0–0.2)

## 2022-09-27 LAB — PROTIME-INR
INR: 2.9 — ABNORMAL HIGH (ref 0.8–1.2)
Prothrombin Time: 30.3 s — ABNORMAL HIGH (ref 11.4–15.2)

## 2022-09-27 LAB — MAGNESIUM: Magnesium: 1.6 mg/dL — ABNORMAL LOW (ref 1.7–2.4)

## 2022-09-27 MED ORDER — VITAMIN K1 10 MG/ML IJ SOLN
10.0000 mg | Freq: Once | INTRAVENOUS | Status: AC
  Administered 2022-09-27: 10 mg via INTRAVENOUS
  Filled 2022-09-27: qty 1

## 2022-09-27 MED ORDER — POTASSIUM CHLORIDE CRYS ER 20 MEQ PO TBCR
40.0000 meq | EXTENDED_RELEASE_TABLET | Freq: Once | ORAL | Status: AC
  Administered 2022-09-27: 40 meq via ORAL
  Filled 2022-09-27: qty 2

## 2022-09-27 MED ORDER — MAGNESIUM SULFATE 2 GM/50ML IV SOLN
2.0000 g | Freq: Once | INTRAVENOUS | Status: AC
  Administered 2022-09-27: 2 g via INTRAVENOUS
  Filled 2022-09-27: qty 50

## 2022-09-27 NOTE — Progress Notes (Addendum)
PROGRESS NOTE  Christia Lahm [name] ION:[id number] DOB: 1980/08/01 DOA: 09/25/2022 PCP: [name], MD   LOS: 2 days   Brief Narrative / Interim history: This is a 42 year old male with history of decompensated alcoholic cirrhosis, ongoing EtOH use, history of SBP due to ESBL, chronic pancytopenia comes into the hospital with abdominal pain and distention for the past few days.  Last alcoholic drink was 9/14.  He reported to the admitting MD that he ran out of medication but denies that for me and in fact has all the medication bottles with him in the hospital room.   Subjective / 24h Interval events: Feels like his abdomen is better today.  No nausea or vomiting.  No fever or chills.  Assesement and Plan: Principal Problem:   Decompensated hepatic cirrhosis (HCC) Active Problems:   Alcoholic cirrhosis of liver with ascites (HCC)   Alcohol use disorder   H/O spontaneous bacterial peritonitis   Pancytopenia (HCC)   Macrocytic anemia   Umbilical hernia   GERD (gastroesophageal reflux disease)   Tobacco abuse   Thrombocytopenia (HCC)   Coagulopathy (HCC)   Metabolic acidosis   Principal problem Abdominal pain, due to ascites-patient had 5 L removed in the emergency room on 9/15 as well as 3.8 L by IR on 9/16.  Fortunately fluid studies do not show evidence of bacterial infection with only 64 cells. -Gram stain without organisms and culture still negative today -Status post albumin x 2  Active problems E. coli bacteremia -blood cultures speciated E. coli on 9/16, given history of ESBL he has been started on meropenem.  He is on antibiotic prophylaxis at home with Bactrim, patient has his bottles with him and tells me he has been taking it as instructed, it is rare that he forgets it -Due to development of bacterial while on antibiotics at home, ID consulted  Decompensated liver cirrhosis -very poor prognosis given the fact that he continues to drink.  This was  extensively discussed with the patient , yet he did not commit that he is ready to quit alcohol.  He tells me that he drinks because it relaxes him  Pancytopenia -due to advanced liver disease.  He is also iron deficient, continue home iron.  Hemoglobin is stable, he usually runs in the 8-9 range.  No bleeding  Coagulopathy-INR 2.4 yesterday, status post vitamin K.  INR 2.9 today.  Repeat vitamin K however if he does not have healthy liver tissue probably cannot use it to make any coagulation factors  Hyponatremia-likely in the setting of decompensated cirrhosis  History of ESBL associated SBP in May 2024-noted, now with concern for bacteremia.  Continue meropenem  Hypoalbuminemia-due to cirrhosis.  Received albumin x 2  Umbilical hernia - Patient has history of nonreducible umbilical hernia.  He is asking if repair is an option.  Currently he is not a surgical candidate, discussed with him that he needs to be sober to allow some improvement of his platelets and INR at the minimum  Chronic alcohol use disorder -continue CIWA.  Alcohol level on admission elevated at 53.  Counseled for cessation   Scheduled Meds:  ferrous sulfate  325 mg Oral Q breakfast   folic acid  1 mg Oral Daily   furosemide  20 mg Oral Daily   lactulose  30 g Oral Q6H   multivitamin with minerals  1 tablet Oral Daily   pantoprazole  40 mg Oral Daily   sodium bicarbonate  650 mg Oral BID  sodium chloride flush  3 mL Intravenous Q12H   spironolactone  50 mg Oral Daily   thiamine  100 mg Oral Daily   Or   thiamine  100 mg Intravenous Daily   Continuous Infusions:  sodium chloride     meropenem (MERREM) IV 1 g (09/27/22 0529)   PRN Meds:.sodium chloride, acetaminophen **OR** acetaminophen, LORazepam **OR** LORazepam, morphine injection, ondansetron **OR** ondansetron (ZOFRAN) IV, senna-docusate, sodium chloride flush  Current Outpatient Medications  Medication Instructions   FeroSul 325 mg, Oral, Daily with  breakfast   folic acid (FOLVITE) 1 mg, Oral, Daily   furosemide (LASIX) 20 mg, Oral, Daily   lactulose (CHRONULAC) 30 g, Oral, Every 6 hours   midodrine (PROAMATINE) 10 mg, Oral, 3 times daily with meals   pantoprazole (PROTONIX) 40 mg, Oral, Daily   propranolol (INDERAL) 10 mg, Oral, 2 times daily   sodium bicarbonate 650 mg, Oral, 2 times daily   spironolactone (ALDACTONE) 50 mg, Oral, Daily   sulfamethoxazole-trimethoprim (BACTRIM DS) 800-160 MG tablet 1 tablet, Oral, Daily    Diet Orders (From admission, onward)     Start     Ordered   09/26/22 0651  Diet Heart Room service appropriate? Yes; Fluid consistency: Thin; Fluid restriction: 2000 mL Fluid  Diet effective now       Comments: Salt restriction 2 g/day  Question Answer Comment  Room service appropriate? Yes   Fluid consistency: Thin   Fluid restriction: 2000 mL Fluid      09/26/22 0650            DVT prophylaxis: SCDs Start: 09/25/22 2124 Place TED hose Start: 09/25/22 2124   Lab Results  Component Value Date   PLT 29 (LL) 09/27/2022      Code Status: Full Code  Family Communication: no family at bedside   Status is: Inpatient Remains inpatient appropriate because: severity of illness  Level of care: Telemetry Medical  Consultants:  none  Objective: Vitals:   09/26/22 1917 09/27/22 0400 09/27/22 0500 09/27/22 0912  BP: (!) 97/59 109/63  (!) 104/57  Pulse: 91 92  87  Resp: 18 18  17   Temp: 97.9 F (36.6 C) 98.6 F (37 C)  98.6 F (37 C)  TempSrc: Oral Oral  Oral  SpO2: 99% 100%  100%  Weight:   77.1 kg   Height:        Intake/Output Summary (Last 24 hours) at 09/27/2022 1029 Last data filed at 09/27/2022 0912 Gross per 24 hour  Intake 1774.56 ml  Output 700 ml  Net 1074.56 ml   Wt Readings from Last 3 Encounters:  09/27/22 77.1 kg  08/17/22 76.7 kg  07/13/22 73.2 kg    Examination:  Constitutional: NAD Eyes: lids and conjunctivae normal, + scleral icterus ENMT: mmm Neck:  normal, supple Respiratory: clear to auscultation bilaterally, no wheezing, no crackles. Normal respiratory effort.  Cardiovascular: Regular rate and rhythm, no murmurs / rubs / gallops. No LE edema. Abdomen: soft, no distention, no tenderness. Bowel sounds positive.  Skin: no rashes   Data Reviewed: I have independently reviewed following labs and imaging studies   CBC Recent Labs  Lab 09/25/22 1936 09/26/22 0728 09/26/22 1922 09/27/22 0327  WBC 3.8* 3.9* 6.8 6.5  HGB 10.4* 7.9* 8.5* 8.1*  HCT 30.2* 23.2* 24.1* 23.4*  PLT 47* 31* 29* 29*  MCV 105.6* 105.0* 106.2* 105.9*  MCH 36.4* 35.7* 37.4* 36.7*  MCHC 34.4 34.1 35.3 34.6  RDW 14.1 14.4 14.6 14.5  LYMPHSABS 0.4*  --   --   --  MONOABS 0.2  --   --   --   EOSABS 0.1  --   --   --   BASOSABS 0.0  --   --   --     Recent Labs  Lab 09/25/22 1936 09/25/22 1940 09/26/22 0305 09/27/22 0327  NA 130*  --  132* 131*  K 3.9  --  3.9 3.4*  CL 102  --  104 105  CO2 19*  --  21* 20*  GLUCOSE 118*  --  91 128*  BUN 5*  --  6 13  CREATININE 1.00  --  1.13 1.12  CALCIUM 7.9*  --  7.6* 7.3*  AST 40  --  28 23  ALT 19  --  13 12  ALKPHOS 122  --  81 68  BILITOT 4.0*  --  4.7* 3.2*  ALBUMIN 2.5*  --  2.0* 2.1*  MG 1.7  --   --  1.6*  LATICACIDVEN  --  1.7  --   --   INR 1.9*  --  2.4* 2.9*  AMMONIA  --   --  28  --     ------------------------------------------------------------------------------------------------------------------ No results for input(s): "CHOL", "HDL", "LDLCALC", "TRIG", "CHOLHDL", "LDLDIRECT" in the last 72 hours.  Lab Results  Component Value Date   HGBA1C 4.7 (L) 10/14/2019   ------------------------------------------------------------------------------------------------------------------ No results for input(s): "TSH", "T4TOTAL", "T3FREE", "THYROIDAB" in the last 72 hours.  Invalid input(s): "FREET3"  Cardiac Enzymes No results for input(s): "CKMB", "TROPONINI", "MYOGLOBIN" in the last 168  hours.  Invalid input(s): "CK" ------------------------------------------------------------------------------------------------------------------    Component Value Date/Time   BNP 34.3 02/28/2022 1240    CBG: No results for input(s): "GLUCAP" in the last 168 hours.  Recent Results (from the past 240 hour(s))  Blood culture (routine x 2)     Status: Abnormal (Preliminary result)   Collection Time: 09/25/22  7:36 PM   Specimen: BLOOD RIGHT ARM  Result Value Ref Range Status   Specimen Description BLOOD RIGHT ARM  Final   Special Requests   Final    BOTTLES DRAWN AEROBIC AND ANAEROBIC Blood Culture results may not be optimal due to an inadequate volume of blood received in culture bottles   Culture  Setup Time   Final    GRAM NEGATIVE RODS IN BOTH AEROBIC AND ANAEROBIC BOTTLES CRITICAL RESULT CALLED TO, READ BACK BY AND VERIFIED WITH: PHARMD A UTOMWEN [id number] AT 1027 AM BY CM    Culture (A)  Final    ESCHERICHIA COLI SUSCEPTIBILITIES TO FOLLOW Performed at [facility], [address], [place], [place] [address]    Report Status PENDING  Incomplete  Blood Culture ID Panel (Reflexed)     Status: Abnormal   Collection Time: 09/25/22  7:36 PM  Result Value Ref Range Status   Enterococcus faecalis NOT DETECTED NOT DETECTED Final   Enterococcus Faecium NOT DETECTED NOT DETECTED Final   Listeria monocytogenes NOT DETECTED NOT DETECTED Final   Staphylococcus species NOT DETECTED NOT DETECTED Final   Staphylococcus aureus (BCID) NOT DETECTED NOT DETECTED Final   Staphylococcus epidermidis NOT DETECTED NOT DETECTED Final   Staphylococcus lugdunensis NOT DETECTED NOT DETECTED Final   Streptococcus species NOT DETECTED NOT DETECTED Final   Streptococcus agalactiae NOT DETECTED NOT DETECTED Final   Streptococcus pneumoniae NOT DETECTED NOT DETECTED Final   Streptococcus pyogenes NOT DETECTED NOT DETECTED Final   A.calcoaceticus-baumannii NOT DETECTED NOT DETECTED Final    Bacteroides fragilis NOT DETECTED NOT DETECTED Final  Enterobacterales DETECTED (A) NOT DETECTED Final    Comment: Enterobacterales represent a large order of gram negative bacteria, not a single organism. CRITICAL RESULT CALLED TO, READ BACK BY AND VERIFIED WITH: PHARMD A UTOMWEN [id number] AT 1027 AM BY CM    Enterobacter cloacae complex NOT DETECTED NOT DETECTED Final   Escherichia coli DETECTED (A) NOT DETECTED Final    Comment: CRITICAL RESULT CALLED TO, READ BACK BY AND VERIFIED WITH: PHARMD A UTOMWEN [id number] AT 1027 AM BY CM    Klebsiella aerogenes NOT DETECTED NOT DETECTED Final   Klebsiella oxytoca NOT DETECTED NOT DETECTED Final   Klebsiella pneumoniae NOT DETECTED NOT DETECTED Final   Proteus species NOT DETECTED NOT DETECTED Final   Salmonella species NOT DETECTED NOT DETECTED Final   Serratia marcescens NOT DETECTED NOT DETECTED Final   Haemophilus influenzae NOT DETECTED NOT DETECTED Final   Neisseria meningitidis NOT DETECTED NOT DETECTED Final   Pseudomonas aeruginosa NOT DETECTED NOT DETECTED Final   Stenotrophomonas maltophilia NOT DETECTED NOT DETECTED Final   Candida albicans NOT DETECTED NOT DETECTED Final   Candida auris NOT DETECTED NOT DETECTED Final   Candida glabrata NOT DETECTED NOT DETECTED Final   Candida krusei NOT DETECTED NOT DETECTED Final   Candida parapsilosis NOT DETECTED NOT DETECTED Final   Candida tropicalis NOT DETECTED NOT DETECTED Final   Cryptococcus neoformans/gattii NOT DETECTED NOT DETECTED Final   CTX-M ESBL NOT DETECTED NOT DETECTED Final   Carbapenem resistance IMP NOT DETECTED NOT DETECTED Final   Carbapenem resistance KPC NOT DETECTED NOT DETECTED Final   Carbapenem resistance NDM NOT DETECTED NOT DETECTED Final   Carbapenem resist OXA 48 LIKE NOT DETECTED NOT DETECTED Final   Carbapenem resistance VIM NOT DETECTED NOT DETECTED Final    Comment: Performed at [facility], [address] [place], [place] [address]  Blood culture  (routine x 2)     Status: Abnormal (Preliminary result)   Collection Time: 09/25/22  7:54 PM   Specimen: BLOOD LEFT FOREARM  Result Value Ref Range Status   Specimen Description BLOOD LEFT FOREARM  Final   Special Requests   Final    BOTTLES DRAWN AEROBIC AND ANAEROBIC Blood Culture adequate volume   Culture  Setup Time   Final    GRAM NEGATIVE RODS IN BOTH AEROBIC AND ANAEROBIC BOTTLES CRITICAL VALUE NOTED.  VALUE IS CONSISTENT WITH PREVIOUSLY REPORTED AND CALLED VALUE.    Culture (A)  Final    ESCHERICHIA COLI SUSCEPTIBILITIES TO FOLLOW Performed at [facility], [address] [place], [place] [address]    Report Status PENDING  Incomplete  Culture, body fluid w Gram Stain-bottle     Status: None (Preliminary result)   Collection Time: 09/25/22  8:51 PM   Specimen: Fluid  Result Value Ref Range Status   Specimen Description FLUID PERITONEAL  Final   Special Requests   Final    BOTTLES DRAWN AEROBIC AND ANAEROBIC Blood Culture adequate volume   Culture   Final    NO GROWTH 2 DAYS Performed at [facility], [address] [place], [place] [address]    Report Status PENDING  Incomplete  Gram stain     Status: None   Collection Time: 09/25/22  8:51 PM   Specimen: Fluid  Result Value Ref Range Status   Specimen Description FLUID PERITONEAL  Final   Special Requests NONE  Final   Gram Stain   Final    RARE WBC PRESENT, PREDOMINANTLY  MONONUCLEAR NO ORGANISMS SEEN Performed at [facility], [address], [place], [place] [address]    Report Status 09/25/2022 FINAL  Final     Radiology Studies: No results found.   Pamella Pert, MD, PhD [facility]  Between 7 am - 7 pm I am available, please contact me via Amion (for emergencies) or Securechat (non urgent messages)  Between 7 pm - 7 am I am not available, please contact night coverage MD/APP via Amion

## 2022-09-27 NOTE — Plan of Care (Signed)
Problem: Clinical Measurements: Goal: Cardiovascular complication will be avoided Outcome: Progressing   Problem: Nutrition: Goal: Adequate nutrition will be maintained Outcome: Progressing   Problem: Elimination: Goal: Will not experience complications related to urinary retention Outcome: Progressing

## 2022-09-27 NOTE — Consult Note (Signed)
[facility]    Date of Admission:  09/25/2022     Reason for Consult: ecoli bacteremia    Referring Provider: Elvera Lennox     Abx: 9/16-c meropenem        Assessment: 42 y.o. male with cirrhosis (alcoholic), pancytopenia and prior sbp on bactrim prophylaxis, hx esbl ecoli, admitted for abd pain, found to have ecoli bacteremia   Ct abd cirrhosis/ascites  Bcx ecoli Fluid cx ngtd; cell count not meeting sbp criteria  Abd pain related to sepsis? Lft not up to suggest etoh hepatitis  Plan: Continue meropenem; will plan 7 day of tx and PO abx is dependent on if one is available F/u final sensitivity of ecoli It wouldn't be inappropriate to continue prophylaxis with bactrim -- it does prevent other gnr even missing the potential esbl ecoli strain he had previously  Discussed with primary team     ------------------------------------------------ Principal Problem:   Decompensated hepatic cirrhosis (HCC) Active Problems:   Alcoholic cirrhosis of liver with ascites (HCC)   Umbilical hernia   Pancytopenia (HCC)   Tobacco abuse   Alcohol use disorder   Thrombocytopenia (HCC)   Macrocytic anemia   GERD (gastroesophageal reflux disease)   H/O spontaneous bacterial peritonitis   Coagulopathy (HCC)   Metabolic acidosis    HPI: Mathew [name] is a 42 y.o. male with cirrhosis (alcoholic), pancytopenia and prior sbp on bactrim prophylaxis, hx esbl ecoli, admitted for abd pain, found to have ecoli bacteremia   Patient's previous esbl ecoli within the past year wasn't susceptible to either bactrim/cipro  He had severe abd pain for 2 weeks without f/c. Reported associated appetite. No n/v/diarrhea  Mild leukopenia on presentation without aki Afebrile  9/15 s/p diagnostic paracentesis --> not meeting sbp criteria based on cell count; cx ngtd 9/15 bcx ecoli esbl  On meropenem Sx better    Family History  Adopted: Yes   Problem Relation Age of Onset   Cancer Neg Hx    Heart disease Neg Hx     Social History   Tobacco Use   Smoking status: Every Day    Current packs/day: 0.25    Average packs/day: 0.3 packs/day for 3.1 years (0.8 ttl pk-yrs)    Types: Cigarettes    Start date: 08/11/2019   Smokeless tobacco: Never  Vaping Use   Vaping status: Never Used  Substance Use Topics   Alcohol use: Yes    Alcohol/week: 2.0 standard drinks of alcohol    Types: 2 Cans of beer per week    Comment: 3-6 beer cans a week   Drug use: No    No Known Allergies  Review of Systems: ROS All Other ROS was negative, except mentioned above   Past Medical History:  Diagnosis Date   Acute metabolic encephalopathy 06/20/2021   Alcohol withdrawal seizure (HCC)    Cirrhosis (HCC)    ETOH abuse    Pneumonia 04/02/2020   SBP (spontaneous bacterial peritonitis) (HCC) 06/17/2021       Scheduled Meds:  ferrous sulfate  325 mg Oral Q breakfast   folic acid  1 mg Oral Daily   furosemide  20 mg Oral Daily   lactulose  30 g Oral Q6H   multivitamin with minerals  1 tablet Oral Daily   pantoprazole  40 mg Oral Daily   sodium bicarbonate  650 mg Oral BID   sodium chloride flush  3 mL Intravenous Q12H   spironolactone  50 mg Oral Daily   thiamine  100 mg Oral Daily   Or   thiamine  100 mg Intravenous Daily   Continuous Infusions:  sodium chloride     meropenem (MERREM) IV 1 g (09/27/22 0529)   PRN Meds:.sodium chloride, acetaminophen **OR** acetaminophen, LORazepam **OR** LORazepam, morphine injection, ondansetron **OR** ondansetron (ZOFRAN) IV, senna-docusate, sodium chloride flush   OBJECTIVE: Blood pressure (!) 104/57, pulse 87, temperature 98.6 F (37 C), temperature source Oral, resp. rate 17, height 5\' 1"  (1.549 m), weight 77.1 kg, SpO2 100%.  Physical Exam  General/constitutional: no distress, pleasant HEENT: Normocephalic, PER, Conj Clear, EOMI, Oropharynx clear Neck supple CV: rrr no  mrg Lungs: clear to auscultation, normal respiratory effort Abd: Soft, Nontender -- non-incarcerated umbillical hernia Ext: no edema Skin: No Rash Neuro: nonfocal MSK: no peripheral joint swelling/tenderness/warmth; back spines nontender  Lab Results Lab Results  Component Value Date   WBC 5.5 09/27/2022   HGB 8.4 (L) 09/27/2022   HCT 24.8 (L) 09/27/2022   MCV 107.4 (H) 09/27/2022   PLT 29 (LL) 09/27/2022    Lab Results  Component Value Date   CREATININE 1.12 09/27/2022   BUN 13 09/27/2022   NA 131 (L) 09/27/2022   K 3.4 (L) 09/27/2022   CL 105 09/27/2022   CO2 20 (L) 09/27/2022    Lab Results  Component Value Date   ALT 12 09/27/2022   AST 23 09/27/2022   ALKPHOS 68 09/27/2022   BILITOT 3.2 (H) 09/27/2022      Microbiology: Recent Results (from the past 240 hour(s))  Blood culture (routine x 2)     Status: Abnormal (Preliminary result)   Collection Time: 09/25/22  7:36 PM   Specimen: BLOOD RIGHT ARM  Result Value Ref Range Status   Specimen Description BLOOD RIGHT ARM  Final   Special Requests   Final    BOTTLES DRAWN AEROBIC AND ANAEROBIC Blood Culture results may not be optimal due to an inadequate volume of blood received in culture bottles   Culture  Setup Time   Final    GRAM NEGATIVE RODS IN BOTH AEROBIC AND ANAEROBIC BOTTLES CRITICAL RESULT CALLED TO, READ BACK BY AND VERIFIED WITH: PHARMD A UTOMWEN [id number] AT 1027 AM BY CM    Culture (A)  Final    ESCHERICHIA COLI SUSCEPTIBILITIES TO FOLLOW Performed at [facility], [address], [place], [place] [address]    Report Status PENDING  Incomplete  Blood Culture ID Panel (Reflexed)     Status: Abnormal   Collection Time: 09/25/22  7:36 PM  Result Value Ref Range Status   Enterococcus faecalis NOT DETECTED NOT DETECTED Final   Enterococcus Faecium NOT DETECTED NOT DETECTED Final   Listeria monocytogenes NOT DETECTED NOT DETECTED Final   Staphylococcus species NOT DETECTED NOT DETECTED Final    Staphylococcus aureus (BCID) NOT DETECTED NOT DETECTED Final   Staphylococcus epidermidis NOT DETECTED NOT DETECTED Final   Staphylococcus lugdunensis NOT DETECTED NOT DETECTED Final   Streptococcus species NOT DETECTED NOT DETECTED Final   Streptococcus agalactiae NOT DETECTED NOT DETECTED Final   Streptococcus pneumoniae NOT DETECTED NOT DETECTED Final   Streptococcus pyogenes NOT DETECTED NOT DETECTED Final   A.calcoaceticus-baumannii NOT DETECTED NOT DETECTED Final   Bacteroides fragilis NOT DETECTED NOT DETECTED Final   Enterobacterales DETECTED (A) NOT DETECTED Final    Comment: Enterobacterales represent a large order of gram negative bacteria, not a single organism. CRITICAL RESULT CALLED TO, READ BACK BY AND VERIFIED WITH:  PHARMD A UTOMWEN [id number] AT 1027 AM BY CM    Enterobacter cloacae complex NOT DETECTED NOT DETECTED Final   Escherichia coli DETECTED (A) NOT DETECTED Final    Comment: CRITICAL RESULT CALLED TO, READ BACK BY AND VERIFIED WITH: PHARMD A UTOMWEN [id number] AT 1027 AM BY CM    Klebsiella aerogenes NOT DETECTED NOT DETECTED Final   Klebsiella oxytoca NOT DETECTED NOT DETECTED Final   Klebsiella pneumoniae NOT DETECTED NOT DETECTED Final   Proteus species NOT DETECTED NOT DETECTED Final   Salmonella species NOT DETECTED NOT DETECTED Final   Serratia marcescens NOT DETECTED NOT DETECTED Final   Haemophilus influenzae NOT DETECTED NOT DETECTED Final   Neisseria meningitidis NOT DETECTED NOT DETECTED Final   Pseudomonas aeruginosa NOT DETECTED NOT DETECTED Final   Stenotrophomonas maltophilia NOT DETECTED NOT DETECTED Final   Candida albicans NOT DETECTED NOT DETECTED Final   Candida auris NOT DETECTED NOT DETECTED Final   Candida glabrata NOT DETECTED NOT DETECTED Final   Candida krusei NOT DETECTED NOT DETECTED Final   Candida parapsilosis NOT DETECTED NOT DETECTED Final   Candida tropicalis NOT DETECTED NOT DETECTED Final   Cryptococcus neoformans/gattii NOT  DETECTED NOT DETECTED Final   CTX-M ESBL NOT DETECTED NOT DETECTED Final   Carbapenem resistance IMP NOT DETECTED NOT DETECTED Final   Carbapenem resistance KPC NOT DETECTED NOT DETECTED Final   Carbapenem resistance NDM NOT DETECTED NOT DETECTED Final   Carbapenem resist OXA 48 LIKE NOT DETECTED NOT DETECTED Final   Carbapenem resistance VIM NOT DETECTED NOT DETECTED Final    Comment: Performed at [facility], [address] [place], [place] [address]  Blood culture (routine x 2)     Status: Abnormal (Preliminary result)   Collection Time: 09/25/22  7:54 PM   Specimen: BLOOD LEFT FOREARM  Result Value Ref Range Status   Specimen Description BLOOD LEFT FOREARM  Final   Special Requests   Final    BOTTLES DRAWN AEROBIC AND ANAEROBIC Blood Culture adequate volume   Culture  Setup Time   Final    GRAM NEGATIVE RODS IN BOTH AEROBIC AND ANAEROBIC BOTTLES CRITICAL VALUE NOTED.  VALUE IS CONSISTENT WITH PREVIOUSLY REPORTED AND CALLED VALUE.    Culture (A)  Final    ESCHERICHIA COLI SUSCEPTIBILITIES TO FOLLOW Performed at [facility], [address] [place], [place] [address]    Report Status PENDING  Incomplete  Culture, body fluid w Gram Stain-bottle     Status: None (Preliminary result)   Collection Time: 09/25/22  8:51 PM   Specimen: Fluid  Result Value Ref Range Status   Specimen Description FLUID PERITONEAL  Final   Special Requests   Final    BOTTLES DRAWN AEROBIC AND ANAEROBIC Blood Culture adequate volume   Culture   Final    NO GROWTH 2 DAYS Performed at [facility], [address], [place], [place] [address]    Report Status PENDING  Incomplete  Gram stain     Status: None   Collection Time: 09/25/22  8:51 PM   Specimen: Fluid  Result Value Ref Range Status   Specimen Description FLUID PERITONEAL  Final   Special Requests NONE  Final   Gram Stain   Final    RARE WBC PRESENT, PREDOMINANTLY MONONUCLEAR NO ORGANISMS SEEN Performed at [facility], [address] [place], [place] [address]    Report Status 09/25/2022 FINAL  Final     Serology:  Imaging: If present, new imagings (plain films, ct scans, and mri) have been personally visualized and interpreted; radiology reports have been reviewed. Decision making incorporated into the Impression / Recommendations.  9/16 abd pelv ct 1. Cirrhosis with large volume simple ascites. 2. No bowel obstruction. The umbilical hernia contains ascitic fluid. 3. Cholelithiasis. 4. Small left pleural effusion.   Raymondo Band, MD [facility] [phone] pager    09/27/2022, 1:59 PM

## 2022-09-28 ENCOUNTER — Other Ambulatory Visit (HOSPITAL_COMMUNITY): Payer: Self-pay

## 2022-09-28 DIAGNOSIS — K652 Spontaneous bacterial peritonitis: Secondary | ICD-10-CM

## 2022-09-28 DIAGNOSIS — K7469 Other cirrhosis of liver: Secondary | ICD-10-CM

## 2022-09-28 LAB — CULTURE, BLOOD (ROUTINE X 2): Special Requests: ADEQUATE

## 2022-09-28 MED ORDER — CIPROFLOXACIN HCL 500 MG PO TABS
750.0000 mg | ORAL_TABLET | Freq: Two times a day (BID) | ORAL | Status: DC
  Administered 2022-09-28: 750 mg via ORAL
  Filled 2022-09-28: qty 2

## 2022-09-28 MED ORDER — SODIUM CHLORIDE 0.9 % IV SOLN: 2.0000 g | INTRAVENOUS | Status: DC

## 2022-09-28 MED ORDER — SULFAMETHOXAZOLE-TRIMETHOPRIM 800-160 MG PO TABS
1.0000 | ORAL_TABLET | Freq: Every day | ORAL | 0 refills | Status: DC
  Filled 2022-09-28: qty 30, 30d supply, fill #0

## 2022-09-28 MED ORDER — CIPROFLOXACIN HCL 750 MG PO TABS
750.0000 mg | ORAL_TABLET | Freq: Two times a day (BID) | ORAL | 0 refills | Status: AC
  Filled 2022-09-28: qty 10, 5d supply, fill #0

## 2022-09-28 MED ORDER — SULFAMETHOXAZOLE-TRIMETHOPRIM 800-160 MG PO TABS: 1.0000 | ORAL_TABLET | Freq: Every day | ORAL | Status: DC

## 2022-09-28 NOTE — Hospital Course (Signed)
42 year old male with history of decompensated alcoholic cirrhosis, ongoing EtOH use, history of SBP due to ESBL, chronic pancytopenia comes into the hospital with abdominal pain and distention for the past few days.  Last alcoholic drink was 9/14.  He reported to the admitting MD that he ran out of medication but denies that for me and in fact has all the medication bottles with him in the hospital room.

## 2022-09-28 NOTE — Progress Notes (Signed)
[facility]    Date of Admission:  09/25/2022       Abx: meropenem        Assessment: Alcoholic cirrhosis Abd pain resolved Sepsis/ecoli bacteremia  Ecoli R bactrim, S cipro, not esbl    Plan: Finish 7 day abx treatment and can transition to cipro today After cipro is done, resume bactrim prophylaxis -- least toxic between of the 2 choices and would still work for preponderant other gram negative Id will sign off Discharge per primary team      ------------------------------------------------ Principal Problem:   Decompensated hepatic cirrhosis (HCC) Active Problems:   Alcoholic cirrhosis of liver with ascites (HCC)   Umbilical hernia   Pancytopenia (HCC)   Tobacco abuse   Alcohol use disorder   Thrombocytopenia (HCC)   Macrocytic anemia   GERD (gastroesophageal reflux disease)   H/O spontaneous bacterial peritonitis   Coagulopathy (HCC)   Metabolic acidosis   Subjective: Feels well back to baseline No fever No n/v/diarrhea   Family History  Adopted: Yes  Problem Relation Age of Onset   Cancer Neg Hx    Heart disease Neg Hx     Social History   Tobacco Use   Smoking status: Every Day    Current packs/day: 0.25    Average packs/day: 0.3 packs/day for 3.1 years (0.8 ttl pk-yrs)    Types: Cigarettes    Start date: 08/11/2019   Smokeless tobacco: Never  Vaping Use   Vaping status: Never Used  Substance Use Topics   Alcohol use: Yes    Alcohol/week: 2.0 standard drinks of alcohol    Types: 2 Cans of beer per week    Comment: 3-6 beer cans a week   Drug use: No    No Known Allergies  Review of Systems: ROS All Other ROS was negative, except mentioned above   Past Medical History:  Diagnosis Date   Acute metabolic encephalopathy 06/20/2021   Alcohol withdrawal seizure (HCC)    Cirrhosis (HCC)    ETOH abuse    Pneumonia 04/02/2020   SBP (spontaneous bacterial peritonitis) (HCC) 06/17/2021        Scheduled Meds:  ciprofloxacin  750 mg Oral BID   ferrous sulfate  325 mg Oral Q breakfast   folic acid  1 mg Oral Daily   furosemide  20 mg Oral Daily   lactulose  30 g Oral Q6H   multivitamin with minerals  1 tablet Oral Daily   pantoprazole  40 mg Oral Daily   sodium bicarbonate  650 mg Oral BID   sodium chloride flush  3 mL Intravenous Q12H   spironolactone  50 mg Oral Daily   [START ON 10/03/2022] sulfamethoxazole-trimethoprim  1 tablet Oral Daily   thiamine  100 mg Oral Daily   Or   thiamine  100 mg Intravenous Daily   Continuous Infusions:  sodium chloride 250 mL (09/27/22 1500)   PRN Meds:.sodium chloride, acetaminophen **OR** acetaminophen, LORazepam **OR** LORazepam, morphine injection, ondansetron **OR** ondansetron (ZOFRAN) IV, senna-docusate, sodium chloride flush   OBJECTIVE: Blood pressure 112/66, pulse 75, temperature 98.3 F (36.8 C), temperature source Oral, resp. rate 16, height 5\' 1"  (1.549 m), weight 76.5 kg, SpO2 100%.  Physical Exam  General/constitutional: no distress, pleasant HEENT: Normocephalic, PER, Conj Clear, EOMI, Oropharynx clear Neck supple CV: rrr no mrg Lungs: clear to auscultation, normal respiratory effort Abd: Soft, Nontender, nontrangulated umbillical hernia   Lab Results Lab Results  Component  Value Date   WBC 2.5 (L) 09/28/2022   HGB 9.2 (L) 09/28/2022   HCT 27.3 (L) 09/28/2022   MCV 105.8 (H) 09/28/2022   PLT 35 (L) 09/28/2022    Lab Results  Component Value Date   CREATININE 0.63 09/28/2022   BUN 9 09/28/2022   NA 134 (L) 09/28/2022   K 4.4 09/28/2022   CL 107 09/28/2022   CO2 23 09/28/2022    Lab Results  Component Value Date   ALT 13 09/28/2022   AST 28 09/28/2022   ALKPHOS 85 09/28/2022   BILITOT 2.7 (H) 09/28/2022      Microbiology: Recent Results (from the past 240 hour(s))  Blood culture (routine x 2)     Status: Abnormal   Collection Time: 09/25/22  7:36 PM   Specimen: BLOOD RIGHT ARM   Result Value Ref Range Status   Specimen Description BLOOD RIGHT ARM  Final   Special Requests   Final    BOTTLES DRAWN AEROBIC AND ANAEROBIC Blood Culture results may not be optimal due to an inadequate volume of blood received in culture bottles   Culture  Setup Time   Final    GRAM NEGATIVE RODS IN BOTH AEROBIC AND ANAEROBIC BOTTLES CRITICAL RESULT CALLED TO, READ BACK BY AND VERIFIED WITH: PHARMD A UTOMWEN [id number] AT 1027 AM BY CM Performed at [facility], [address] [place], [place] [address]    Culture ESCHERICHIA COLI (A)  Final   Report Status 09/28/2022 FINAL  Final   Organism ID, Bacteria ESCHERICHIA COLI  Final      Susceptibility   Escherichia coli - MIC*    AMPICILLIN >=32 RESISTANT Resistant     CEFEPIME <=0.12 SENSITIVE Sensitive     CEFTAZIDIME <=1 SENSITIVE Sensitive     CEFTRIAXONE <=0.25 SENSITIVE Sensitive     CIPROFLOXACIN <=0.25 SENSITIVE Sensitive     GENTAMICIN <=1 SENSITIVE Sensitive     IMIPENEM <=0.25 SENSITIVE Sensitive     TRIMETH/SULFA >=320 RESISTANT Resistant     AMPICILLIN/SULBACTAM 16 INTERMEDIATE Intermediate     PIP/TAZO <=4 SENSITIVE Sensitive     * ESCHERICHIA COLI  Blood Culture ID Panel (Reflexed)     Status: Abnormal   Collection Time: 09/25/22  7:36 PM  Result Value Ref Range Status   Enterococcus faecalis NOT DETECTED NOT DETECTED Final   Enterococcus Faecium NOT DETECTED NOT DETECTED Final   Listeria monocytogenes NOT DETECTED NOT DETECTED Final   Staphylococcus species NOT DETECTED NOT DETECTED Final   Staphylococcus aureus (BCID) NOT DETECTED NOT DETECTED Final   Staphylococcus epidermidis NOT DETECTED NOT DETECTED Final   Staphylococcus lugdunensis NOT DETECTED NOT DETECTED Final   Streptococcus species NOT DETECTED NOT DETECTED Final   Streptococcus agalactiae NOT DETECTED NOT DETECTED Final   Streptococcus pneumoniae NOT DETECTED NOT DETECTED Final   Streptococcus pyogenes NOT DETECTED NOT DETECTED Final    A.calcoaceticus-baumannii NOT DETECTED NOT DETECTED Final   Bacteroides fragilis NOT DETECTED NOT DETECTED Final   Enterobacterales DETECTED (A) NOT DETECTED Final    Comment: Enterobacterales represent a large order of gram negative bacteria, not a single organism. CRITICAL RESULT CALLED TO, READ BACK BY AND VERIFIED WITH: PHARMD A UTOMWEN [id number] AT 1027 AM BY CM    Enterobacter cloacae complex NOT DETECTED NOT DETECTED Final   Escherichia coli DETECTED (A) NOT DETECTED Final    Comment: CRITICAL RESULT CALLED TO, READ BACK BY AND VERIFIED WITH: PHARMD A UTOMWEN [id number] AT 1027 AM BY CM  Klebsiella aerogenes NOT DETECTED NOT DETECTED Final   Klebsiella oxytoca NOT DETECTED NOT DETECTED Final   Klebsiella pneumoniae NOT DETECTED NOT DETECTED Final   Proteus species NOT DETECTED NOT DETECTED Final   Salmonella species NOT DETECTED NOT DETECTED Final   Serratia marcescens NOT DETECTED NOT DETECTED Final   Haemophilus influenzae NOT DETECTED NOT DETECTED Final   Neisseria meningitidis NOT DETECTED NOT DETECTED Final   Pseudomonas aeruginosa NOT DETECTED NOT DETECTED Final   Stenotrophomonas maltophilia NOT DETECTED NOT DETECTED Final   Candida albicans NOT DETECTED NOT DETECTED Final   Candida auris NOT DETECTED NOT DETECTED Final   Candida glabrata NOT DETECTED NOT DETECTED Final   Candida krusei NOT DETECTED NOT DETECTED Final   Candida parapsilosis NOT DETECTED NOT DETECTED Final   Candida tropicalis NOT DETECTED NOT DETECTED Final   Cryptococcus neoformans/gattii NOT DETECTED NOT DETECTED Final   CTX-M ESBL NOT DETECTED NOT DETECTED Final   Carbapenem resistance IMP NOT DETECTED NOT DETECTED Final   Carbapenem resistance KPC NOT DETECTED NOT DETECTED Final   Carbapenem resistance NDM NOT DETECTED NOT DETECTED Final   Carbapenem resist OXA 48 LIKE NOT DETECTED NOT DETECTED Final   Carbapenem resistance VIM NOT DETECTED NOT DETECTED Final    Comment: Performed at [facility], [address] [place], [place] [address]  Blood culture (routine x 2)     Status: Abnormal   Collection Time: 09/25/22  7:54 PM   Specimen: BLOOD LEFT FOREARM  Result Value Ref Range Status   Specimen Description BLOOD LEFT FOREARM  Final   Special Requests   Final    BOTTLES DRAWN AEROBIC AND ANAEROBIC Blood Culture adequate volume   Culture  Setup Time   Final    GRAM NEGATIVE RODS IN BOTH AEROBIC AND ANAEROBIC BOTTLES CRITICAL VALUE NOTED.  VALUE IS CONSISTENT WITH PREVIOUSLY REPORTED AND CALLED VALUE.    Culture (A)  Final    ESCHERICHIA COLI SUSCEPTIBILITIES PERFORMED ON PREVIOUS CULTURE WITHIN THE LAST 5 DAYS. Performed at [facility], [address] [place], [place] [address]    Report Status 09/28/2022 FINAL  Final  Culture, body fluid w Gram Stain-bottle     Status: None (Preliminary result)   Collection Time: 09/25/22  8:51 PM   Specimen: Fluid  Result Value Ref Range Status   Specimen Description FLUID PERITONEAL  Final   Special Requests   Final    BOTTLES DRAWN AEROBIC AND ANAEROBIC Blood Culture adequate volume   Culture   Final    NO GROWTH 3 DAYS Performed at [facility], [address], [place], [place] [address]    Report Status PENDING  Incomplete  Gram stain     Status: None   Collection Time: 09/25/22  8:51 PM   Specimen: Fluid  Result Value Ref Range Status   Specimen Description FLUID PERITONEAL  Final   Special Requests NONE  Final   Gram Stain   Final    RARE WBC PRESENT, PREDOMINANTLY MONONUCLEAR NO ORGANISMS SEEN Performed at [facility], [address], [place], [place] [address]    Report Status 09/25/2022 FINAL  Final     Serology:    Imaging: If present, new imagings (plain films, ct scans, and mri) have been personally visualized and interpreted; radiology reports have been reviewed. Decision making incorporated into the Impression / Recommendations.    Raymondo Band, MD [facility] [phone]  pager    09/28/2022, 2:54 PM

## 2022-09-28 NOTE — Discharge Summary (Signed)
Physician Discharge Summary   Patient: Mathew [name] MRN: [id number] DOB: Jun 05, 1980  Admit date:     09/25/2022  Discharge date: 09/28/22  Discharge Physician: Mathew [name]   PCP: Mathew Frisk, MD   Recommendations at discharge:    Follow up with PCP in 1-2 weeks  Discharge Diagnoses: Principal Problem:   Decompensated hepatic cirrhosis (HCC) Active Problems:   Alcoholic cirrhosis of liver with ascites (HCC)   Alcohol use disorder   H/O spontaneous bacterial peritonitis   Pancytopenia (HCC)   Macrocytic anemia   Umbilical hernia   GERD (gastroesophageal reflux disease)   Tobacco abuse   Thrombocytopenia (HCC)   Coagulopathy (HCC)   Metabolic acidosis  Resolved Problems:   * No resolved hospital problems. *  Hospital Course: 42 year old male with history of decompensated alcoholic cirrhosis, ongoing EtOH use, history of SBP due to ESBL, chronic pancytopenia comes into the hospital with abdominal pain and distention for the past few days.  Last alcoholic drink was 9/14.  He reported to the admitting MD that he ran out of medication but denies that for me and in fact has all the medication bottles with him in the hospital room.   Assessment and Plan: Principal problem Abdominal pain, due to ascites-patient had 5 L removed in the emergency room on 9/15 as well as 3.8 L by IR on 9/16.  Fortunately fluid studies do not show evidence of bacterial infection with only 64 cells. -Gram stain without organisms and culture neg -Status post albumin x 2   Active problems E. coli bacteremia -blood cultures speciated E. coli on 9/16, given history of ESBL he has been started on meropenem.  He is on antibiotic prophylaxis at home with Bactrim, had admitted to forgetting to take it at times -ID was consulted, recs to treat with 5 days of cipro, then resume prophylactic bactrim   Decompensated liver cirrhosis -very poor prognosis given the fact that he continues to drink.   This was reportedly extensively discussed with the patient this visit.   Pancytopenia -due to advanced liver disease.  He is also iron deficient, continue home iron.  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  Given course of cipro followed by resuming bactrim per above   Hypoalbuminemia-due to cirrhosis.  Received albumin x 2   Umbilical hernia - Patient has history of nonreducible umbilical hernia.  Currently likely not a surgical candidate   Chronic alcohol use disorder - Alcohol level on admission elevated at 53.  Cessation was done this visit        Consultants: ID Procedures performed:   Disposition: Home Diet recommendation:  Cardiac diet DISCHARGE MEDICATION: Allergies as of 09/28/2022   No Known Allergies      Medication List     TAKE these medications    ciprofloxacin 750 MG tablet Commonly known as: CIPRO Take 1 tablet (750 mg total) by mouth 2 (two) times daily for 5 days.   FeroSul 325 (65 Fe) MG tablet Generic drug: ferrous sulfate Tome 1 tableta (325 mg en total) por va oral diariamente con el desayuno. (Take 1 tablet (325 mg total) by mouth daily with breakfast.)   folic acid 1 MG tablet Commonly known as: FOLVITE Tome 1 tableta (1 mg en total) por va oral diariamente. (Take 1 tablet (1 mg total) by mouth  daily.)   furosemide 20 MG tablet Commonly known as: LASIX Tome 1 tableta (20 mg en total) por va oral diariamente. (Take 1 tablet (20 mg total) by mouth daily.)   lactulose 10 GM/15ML solution Commonly known as: CHRONULAC Take 45 mLs (30 g total) by mouth every 6 (six) hours.   midodrine 10 MG tablet Commonly known as: PROAMATINE Take 1 tablet (10 mg total) by mouth 3 (three) times daily with meals.   pantoprazole 40 MG tablet Commonly known as:  PROTONIX Take 1 tablet (40 mg total) by mouth daily at 6 (six) AM.   propranolol 10 MG tablet Commonly known as: INDERAL Take 1 tablet (10 mg total) by mouth 2 (two) times daily.   sodium bicarbonate 650 MG tablet Take 1 tablet (650 mg total) by mouth 2 (two) times daily.   spironolactone 50 MG tablet Commonly known as: ALDACTONE Tome 1 tableta (50 mg en total) por va oral diariamente. (Take 1 tablet (50 mg total) by mouth daily.)   sulfamethoxazole-trimethoprim 800-160 MG tablet Commonly known as: BACTRIM DS Tome 1 tableta por va oral diariamente. (Take 1 tablet by mouth daily.) Start taking on: October 03, 2022 What changed: These instructions start on October 03, 2022. If you are unsure what to do until then, ask your doctor or other care provider.        Follow-up Information     Mathew Frisk, MD. Schedule an appointment as soon as possible for a visit.   Specialty: Pulmonary Disease Contact information: [address] [place] [address] [phone]                Discharge Exam: Mathew [name] Weights   09/25/22 1855 09/27/22 0500 09/28/22 0500  Weight: 77.1 kg 77.1 kg 76.5 kg   General exam: Awake, laying in bed, in nad Respiratory system: Normal respiratory effort, no wheezing Cardiovascular system: regular rate, s1, s2 Gastrointestinal system: Soft, distended, positive BS Central nervous system: CN2-12 grossly intact, strength intact Extremities: Perfused, no clubbing Skin: Normal skin turgor, no notable skin lesions seen Psychiatry: Mood normal // no visual hallucinations   Condition at discharge: fair  The results of significant diagnostics from this hospitalization (including imaging, microbiology, ancillary and laboratory) are listed below for reference.   Imaging Studies: IR Paracentesis  Result Date: 09/26/2022 INDICATION: 42 year old male. History of alcoholic cirrhosis with recurrent ascites. EXAM: ULTRASOUND GUIDED  THERAPEUTIC PARACENTESIS MEDICATIONS: LIDOCAINE 1% 10 ML COMPLICATIONS: None immediate. PROCEDURE: Informed written consent was obtained from the patient after a discussion of the risks, benefits and alternatives to treatment. A timeout was performed prior to the initiation of the procedure. Initial ultrasound scanning demonstrates a moderate amount of ascites within the right lower abdominal quadrant. The right lower abdomen was prepped and draped in the usual sterile fashion. 1% lidocaine was used for local anesthesia. Following this, a 19 gauge, 7-cm, Yueh catheter was introduced. An ultrasound image was saved for documentation purposes. The paracentesis was performed. The catheter was removed and a dressing was applied. The patient tolerated the procedure well without immediate post procedural complication. FINDINGS: A total of approximately 3.8 L of straw-colored fluid was removed. IMPRESSION: Successful ultrasound-guided therapeutic paracentesis yielding 3.8 L of peritoneal fluid. Performed by Anders Grant NP PLAN: The patient has previously been formally evaluated by the [facility] and is being actively followed for potential future intervention. Roanna Banning, MD [facility] Radiology Electronically Signed   By: Cletis Athens  Mugweru M.D.   On: 09/26/2022 17:58   CT ABDOMEN PELVIS W CONTRAST  Result Date: 09/26/2022 CLINICAL DATA:  Cul hernia and concern for bowel obstruction. EXAM: CT ABDOMEN AND PELVIS WITH CONTRAST TECHNIQUE: Multidetector CT imaging of the abdomen and pelvis was performed using the standard protocol following bolus administration of intravenous contrast. RADIATION DOSE REDUCTION: This exam was performed according to the departmental dose-optimization program which includes automated exposure control, adjustment of the mA and/or kV according to patient size and/or use of iterative  reconstruction technique. CONTRAST:  75mL OMNIPAQUE IOHEXOL 350 MG/ML SOLN COMPARISON:  06/23/2022 FINDINGS: Lower chest: Coronary atherosclerosis. Small left pleural effusion. Hepatobiliary: Cirrhosis. No focal liver abnormality.Cholelithiasis. No evidence of biliary inflammation. No ductal dilatation. Pancreas: Unremarkable. Spleen: Modest enlargement of the spleen in the setting of portal hypertension. Adrenals/Urinary Tract: Negative adrenals. No hydronephrosis or stone. Thick walled bladder accentuated by incomplete distension. No focal or acute abnormality. Stomach/Bowel: No obstruction. No visible bowel inflammation. Mild edematous thickening of the proximal colon attributed to portal hypertension, stable. Vascular/Lymphatic: No acute vascular abnormality. No mass or adenopathy. Reproductive:No pathologic findings. Other: Large volume of simple ascites. Musculoskeletal: No acute abnormalities. IMPRESSION: 1. Cirrhosis with large volume simple ascites. 2. No bowel obstruction. The umbilical hernia contains ascitic fluid. 3. Cholelithiasis. 4. Small left pleural effusion. Electronically Signed   By: Tiburcio Pea M.D.   On: 09/26/2022 05:40    Microbiology: Results for orders placed or performed during the hospital encounter of 09/25/22  Blood culture (routine x 2)     Status: Abnormal   Collection Time: 09/25/22  7:36 PM   Specimen: BLOOD RIGHT ARM  Result Value Ref Range Status   Specimen Description BLOOD RIGHT ARM  Final   Special Requests   Final    BOTTLES DRAWN AEROBIC AND ANAEROBIC Blood Culture results may not be optimal due to an inadequate volume of blood received in culture bottles   Culture  Setup Time   Final    GRAM NEGATIVE RODS IN BOTH AEROBIC AND ANAEROBIC BOTTLES CRITICAL RESULT CALLED TO, READ BACK BY AND VERIFIED WITH: PHARMD A UTOMWEN [id number] AT 1027 AM BY CM Performed at [facility], [address], [place], [place] [address]    Culture ESCHERICHIA COLI (A)  Final    Report Status 09/28/2022 FINAL  Final   Organism ID, Bacteria ESCHERICHIA COLI  Final      Susceptibility   Escherichia coli - MIC*    AMPICILLIN >=32 RESISTANT Resistant     CEFEPIME <=0.12 SENSITIVE Sensitive     CEFTAZIDIME <=1 SENSITIVE Sensitive     CEFTRIAXONE <=0.25 SENSITIVE Sensitive     CIPROFLOXACIN <=0.25 SENSITIVE Sensitive     GENTAMICIN <=1 SENSITIVE Sensitive     IMIPENEM <=0.25 SENSITIVE Sensitive     TRIMETH/SULFA >=320 RESISTANT Resistant     AMPICILLIN/SULBACTAM 16 INTERMEDIATE Intermediate     PIP/TAZO <=4 SENSITIVE Sensitive     * ESCHERICHIA COLI  Blood Culture ID Panel (Reflexed)     Status: Abnormal   Collection Time: 09/25/22  7:36 PM  Result Value Ref Range Status   Enterococcus faecalis NOT DETECTED NOT DETECTED Final   Enterococcus Faecium NOT DETECTED NOT DETECTED Final   Listeria monocytogenes NOT DETECTED NOT DETECTED Final   Staphylococcus species NOT DETECTED NOT DETECTED Final   Staphylococcus aureus (BCID) NOT DETECTED NOT DETECTED Final   Staphylococcus epidermidis NOT DETECTED NOT DETECTED Final   Staphylococcus lugdunensis NOT DETECTED NOT DETECTED Final   Streptococcus  species NOT DETECTED NOT DETECTED Final   Streptococcus agalactiae NOT DETECTED NOT DETECTED Final   Streptococcus pneumoniae NOT DETECTED NOT DETECTED Final   Streptococcus pyogenes NOT DETECTED NOT DETECTED Final   A.calcoaceticus-baumannii NOT DETECTED NOT DETECTED Final   Bacteroides fragilis NOT DETECTED NOT DETECTED Final   Enterobacterales DETECTED (A) NOT DETECTED Final    Comment: Enterobacterales represent a large order of gram negative bacteria, not a single organism. CRITICAL RESULT CALLED TO, READ BACK BY AND VERIFIED WITH: PHARMD A UTOMWEN [id number] AT 1027 AM BY CM    Enterobacter cloacae complex NOT DETECTED NOT DETECTED Final   Escherichia coli DETECTED (A) NOT DETECTED Final    Comment: CRITICAL RESULT CALLED TO, READ BACK BY AND VERIFIED WITH: PHARMD A  UTOMWEN [id number] AT 1027 AM BY CM    Klebsiella aerogenes NOT DETECTED NOT DETECTED Final   Klebsiella oxytoca NOT DETECTED NOT DETECTED Final   Klebsiella pneumoniae NOT DETECTED NOT DETECTED Final   Proteus species NOT DETECTED NOT DETECTED Final   Salmonella species NOT DETECTED NOT DETECTED Final   Serratia marcescens NOT DETECTED NOT DETECTED Final   Haemophilus influenzae NOT DETECTED NOT DETECTED Final   Neisseria meningitidis NOT DETECTED NOT DETECTED Final   Pseudomonas aeruginosa NOT DETECTED NOT DETECTED Final   Stenotrophomonas maltophilia NOT DETECTED NOT DETECTED Final   Candida albicans NOT DETECTED NOT DETECTED Final   Candida auris NOT DETECTED NOT DETECTED Final   Candida glabrata NOT DETECTED NOT DETECTED Final   Candida krusei NOT DETECTED NOT DETECTED Final   Candida parapsilosis NOT DETECTED NOT DETECTED Final   Candida tropicalis NOT DETECTED NOT DETECTED Final   Cryptococcus neoformans/gattii NOT DETECTED NOT DETECTED Final   CTX-M ESBL NOT DETECTED NOT DETECTED Final   Carbapenem resistance IMP NOT DETECTED NOT DETECTED Final   Carbapenem resistance KPC NOT DETECTED NOT DETECTED Final   Carbapenem resistance NDM NOT DETECTED NOT DETECTED Final   Carbapenem resist OXA 48 LIKE NOT DETECTED NOT DETECTED Final   Carbapenem resistance VIM NOT DETECTED NOT DETECTED Final    Comment: Performed at [facility], [address] [place], [place] [address]  Blood culture (routine x 2)     Status: Abnormal   Collection Time: 09/25/22  7:54 PM   Specimen: BLOOD LEFT FOREARM  Result Value Ref Range Status   Specimen Description BLOOD LEFT FOREARM  Final   Special Requests   Final    BOTTLES DRAWN AEROBIC AND ANAEROBIC Blood Culture adequate volume   Culture  Setup Time   Final    GRAM NEGATIVE RODS IN BOTH AEROBIC AND ANAEROBIC BOTTLES CRITICAL VALUE NOTED.  VALUE IS CONSISTENT WITH PREVIOUSLY REPORTED AND CALLED VALUE.    Culture (A)  Final    ESCHERICHIA  COLI SUSCEPTIBILITIES PERFORMED ON PREVIOUS CULTURE WITHIN THE LAST 5 DAYS. Performed at [facility], [address] [place], [place] [address]    Report Status 09/28/2022 FINAL  Final  Culture, body fluid w Gram Stain-bottle     Status: None (Preliminary result)   Collection Time: 09/25/22  8:51 PM   Specimen: Fluid  Result Value Ref Range Status   Specimen Description FLUID PERITONEAL  Final   Special Requests   Final    BOTTLES DRAWN AEROBIC AND ANAEROBIC Blood Culture adequate volume   Culture   Final    NO GROWTH 3 DAYS Performed at [facility], [address] [place], [place] [address]    Report Status  PENDING  Incomplete  Gram stain     Status: None   Collection Time: 09/25/22  8:51 PM   Specimen: Fluid  Result Value Ref Range Status   Specimen Description FLUID PERITONEAL  Final   Special Requests NONE  Final   Gram Stain   Final    RARE WBC PRESENT, PREDOMINANTLY MONONUCLEAR NO ORGANISMS SEEN Performed at [facility], [address], [place], [place] [address]    Report Status 09/25/2022 FINAL  Final    Labs: CBC: Recent Labs  Lab 09/25/22 1936 09/26/22 0728 09/26/22 1922 09/27/22 0327 09/27/22 0846 09/27/22 1941 09/28/22 1047  WBC 3.8*   < > 6.8 6.5 5.5 4.4 2.5*  NEUTROABS 3.2  --   --   --   --   --   --   HGB 10.4*   < > 8.5* 8.1* 8.4* 7.8* 9.2*  HCT 30.2*   < > 24.1* 23.4* 24.8* 22.5* 27.3*  MCV 105.6*   < > 106.2* 105.9* 107.4* 104.2* 105.8*  PLT 47*   < > 29* 29* 29* 31* 35*   < > = values in this interval not displayed.   Basic Metabolic Panel: Recent Labs  Lab 09/25/22 1936 09/26/22 0305 09/27/22 0327 09/28/22 1047  NA 130* 132* 131* 134*  K 3.9 3.9 3.4* 4.4  CL 102 104 105 107  CO2 19* 21* 20* 23  GLUCOSE 118* 91 128* 131*  BUN 5* 6 13 9   CREATININE 1.00 1.13 1.12 0.63  CALCIUM 7.9* 7.6* 7.3* 8.1*  MG 1.7  --  1.6*  --   PHOS 3.6  --   --   --    Liver Function Tests: Recent Labs  Lab 09/25/22 1936  09/26/22 0305 09/27/22 0327 09/28/22 1047  AST 40 28 23 28   ALT 19 13 12 13   ALKPHOS 122 81 68 85  BILITOT 4.0* 4.7* 3.2* 2.7*  PROT 6.3* 5.1* 4.7* 5.6*  ALBUMIN 2.5* 2.0* 2.1* 2.7*   CBG: No results for input(s): "GLUCAP" in the last 168 hours.  Discharge time spent: less than 30 minutes.  Signed: Rickey Barbara, MD [facility] 09/28/2022

## 2022-09-29 ENCOUNTER — Telehealth: Payer: Self-pay

## 2022-09-29 ENCOUNTER — Other Ambulatory Visit (HOSPITAL_COMMUNITY): Payer: Self-pay

## 2022-09-29 NOTE — Transitions of Care (Post Inpatient/ED Visit) (Signed)
09/29/2022  Name: Mathew [name] MRN: [id number] DOB: [birth year]  Today's TOC FU Call Status: Today's TOC FU Call Status:: Unsuccessful Call (1st Attempt) Unsuccessful Call (1st Attempt) Date: 09/29/22  Attempted to reach the patient regarding the most recent Inpatient/ED visit.  Follow Up Plan: Additional outreach attempts will be made to reach the patient to complete the Transitions of Care (Post Inpatient/ED visit) call.   Signature  Robyne Peers, RN

## 2022-09-30 LAB — CULTURE, BODY FLUID W GRAM STAIN -BOTTLE
Culture: NO GROWTH
Special Requests: ADEQUATE

## 2022-10-03 ENCOUNTER — Telehealth: Payer: Self-pay | Admitting: Critical Care Medicine

## 2022-10-03 ENCOUNTER — Telehealth: Payer: Self-pay

## 2022-10-03 NOTE — Transitions of Care (Post Inpatient/ED Visit) (Signed)
10/03/2022  Name: Mathew [name] MRN: [id number] DOB: October 10, 1980  Today's TOC FU Call Status: Today's TOC FU Call Status:: Unsuccessful Call (2nd Attempt) Unsuccessful Call (1st Attempt) Date: 09/29/22 Unsuccessful Call (2nd Attempt) Date: 10/03/22  Attempted to reach the patient regarding the most recent Inpatient/ED visit.  Follow Up Plan: Additional outreach attempts will be made to reach the patient to complete the Transitions of Care (Post Inpatient/ED visit) call.   Signature  Robyne Peers, RN

## 2022-10-03 NOTE — Telephone Encounter (Signed)
Call returned to patient : [phone]  with assistance of Spanish Interpreter: [id number]/[employer] and the recording stated that the call cannot be processed.  Two attempts were made with no success

## 2022-10-03 NOTE — Telephone Encounter (Signed)
Copied from CRM [id number]. Topic: General - Inquiry >> Oct 03, 2022 11:38 AM De Blanch wrote: Reason for CRM:Pt is returning missed call from Robyne Peers, RN   Pt is requesting a callback.  Please advise.

## 2022-10-04 ENCOUNTER — Telehealth: Payer: Self-pay

## 2022-10-04 NOTE — Telephone Encounter (Signed)
Pt is returning a missed call from [name] and is requesting to speak with her regarding his last hospital visit.   Pt phone number is [phone]

## 2022-10-04 NOTE — Transitions of Care (Post Inpatient/ED Visit) (Signed)
10/04/2022  Name: Mathew [name] MRN: [id number] DOB: 07/15/1980  Today's TOC FU Call Status: Today's TOC FU Call Status:: Unsuccessful Call (3rd Attempt) Unsuccessful Call (1st Attempt) Date: 09/29/22 Unsuccessful Call (2nd Attempt) Date: 10/03/22 Unsuccessful Call (3rd Attempt) Date: 10/04/22  Attempted to reach the patient regarding the most recent Inpatient/ED visit.  Follow Up Plan: No further outreach attempts will be made at this time. We have been unable to contact the patient.  The Spanish interpreter assisted with calling the patient at the number provided by his brother: [phone], message left with call back requested.  Epic also updated with this phone number.   Signature Robyne Peers, RN

## 2022-10-05 NOTE — Telephone Encounter (Signed)
Call returned to patient with assistance of Spanish Interpreter: [id number]/[employer], message left with call back requested.

## 2022-10-07 ENCOUNTER — Other Ambulatory Visit: Payer: Self-pay

## 2022-10-15 ENCOUNTER — Emergency Department (HOSPITAL_COMMUNITY): Payer: Self-pay

## 2022-10-15 ENCOUNTER — Emergency Department (HOSPITAL_COMMUNITY)
Admission: EM | Admit: 2022-10-15 | Discharge: 2022-10-15 | Disposition: A | Discharge status: Home / Self Care | Payer: Self-pay | Attending: Emergency Medicine | Admitting: Emergency Medicine

## 2022-10-15 ENCOUNTER — Other Ambulatory Visit: Payer: Self-pay

## 2022-10-15 ENCOUNTER — Encounter (HOSPITAL_COMMUNITY): Payer: Self-pay

## 2022-10-15 DIAGNOSIS — D709 Neutropenia, unspecified: Secondary | ICD-10-CM | POA: Insufficient documentation

## 2022-10-15 DIAGNOSIS — R188 Other ascites: Secondary | ICD-10-CM | POA: Insufficient documentation

## 2022-10-15 LAB — CBC WITH DIFFERENTIAL/PLATELET
Abs Immature Granulocytes: 0 10*3/uL (ref 0.00–0.07)
Basophils Absolute: 0 10*3/uL (ref 0.0–0.1)
Basophils Relative: 0 %
Eosinophils Absolute: 0 10*3/uL (ref 0.0–0.5)
Eosinophils Relative: 3 %
HCT: 27.8 % — ABNORMAL LOW (ref 39.0–52.0)
Hemoglobin: 9.3 g/dL — ABNORMAL LOW (ref 13.0–17.0)
Immature Granulocytes: 0 %
Lymphocytes Relative: 41 %
Lymphs Abs: 0.6 10*3/uL — ABNORMAL LOW (ref 0.7–4.0)
MCH: 35.2 pg — ABNORMAL HIGH (ref 26.0–34.0)
MCHC: 33.5 g/dL (ref 30.0–36.0)
MCV: 105.3 fL — ABNORMAL HIGH (ref 80.0–100.0)
Monocytes Absolute: 0.2 10*3/uL (ref 0.1–1.0)
Monocytes Relative: 15 %
Neutro Abs: 0.6 10*3/uL — ABNORMAL LOW (ref 1.7–7.7)
Neutrophils Relative %: 41 %
Platelets: 45 10*3/uL — ABNORMAL LOW (ref 150–400)
RBC: 2.64 MIL/uL — ABNORMAL LOW (ref 4.22–5.81)
RDW: 13.3 % (ref 11.5–15.5)
Smear Review: DECREASED
WBC: 1.4 10*3/uL — CL (ref 4.0–10.5)
nRBC: 0 % (ref 0.0–0.2)

## 2022-10-15 LAB — COMPREHENSIVE METABOLIC PANEL
ALT: 15 U/L (ref 0–44)
AST: 31 U/L (ref 15–41)
Albumin: 2.3 g/dL — ABNORMAL LOW (ref 3.5–5.0)
Alkaline Phosphatase: 118 U/L (ref 38–126)
Anion gap: 5 (ref 5–15)
BUN: <5 mg/dL — ABNORMAL LOW (ref 6–20)
CO2: 22 mmol/L (ref 22–32)
Calcium: 7.7 mg/dL — ABNORMAL LOW (ref 8.9–10.3)
Chloride: 108 mmol/L (ref 98–111)
Creatinine, Ser: 0.93 mg/dL (ref 0.61–1.24)
GFR, Estimated: >60 mL/min (ref >60)
Glucose, Bld: 102 mg/dL — ABNORMAL HIGH (ref 70–99)
Potassium: 3.7 mmol/L (ref 3.5–5.1)
Sodium: 135 mmol/L (ref 135–145)
Total Bilirubin: 3.5 mg/dL — ABNORMAL HIGH (ref 0.3–1.2)
Total Protein: 5.5 g/dL — ABNORMAL LOW (ref 6.5–8.1)

## 2022-10-15 LAB — BODY FLUID CELL COUNT WITH DIFFERENTIAL
Eos, Fluid: 0 %
Lymphs, Fluid: 68 %
Monocyte-Macrophage-Serous Fluid: 32 % — ABNORMAL LOW (ref 50–90)
Neutrophil Count, Fluid: 0 % (ref 0–25)
Total Nucleated Cell Count, Fluid: 80 uL (ref 0–1000)

## 2022-10-15 LAB — GRAM STAIN: Gram Stain: NONE SEEN

## 2022-10-15 LAB — PROTIME-INR
INR: 2.1 — ABNORMAL HIGH (ref 0.8–1.2)
Prothrombin Time: 23.4 s — ABNORMAL HIGH (ref 11.4–15.2)

## 2022-10-15 LAB — LACTIC ACID, PLASMA
Lactic Acid, Venous: 1.5 mmol/L (ref 0.5–1.9)
Lactic Acid, Venous: 1.3 mmol/L (ref 0.5–1.9)

## 2022-10-15 NOTE — ED Provider Notes (Signed)
Accepted handoff at shift change from [facility]. Please see prior provider note for more detail.   Briefly: Patient is 42 y.o. with history of SBP, cirrhosis, alcohol use disorder, who presents to the ED secondary to abdominal distention for the last couple days.   DDX: concern for DDx includes SBP, other intra-abdominal infection, sepsis, ascites, other.  Plan: Follow-up on cell counts drawn during IR paracentesis.  If reassuring.  Patient can be discharged with GI follow-up.   Physical Exam  BP 112/63   Pulse 68   Temp 98.7 F (37.1 C) (Oral)   Resp 18   SpO2 100%   Physical Exam  Procedures  Procedures  ED Course / MDM   Clinical Course as of 10/15/22 2133  Sat Oct 15, 2022  1501 Abdominal pain and distention (h/o of SBP). IR did paracentesis. Awaiting cell counts. May need admission.   [JR]    Clinical Course User Index [JR] [name], PA-C   Medical Decision Making Amount and/or Complexity of Data Reviewed Labs: ordered.     Cell counts reassuring.  On reassessment patient stated he felt well, no abdominal pain.  His abdomen was still soft, notably distended but nontender.  Vitals remained stable and remained afebrile.  Advised him to follow-up with GI.  Discussed return precautions.  Discharged home in good condition.     [name], PA-C 10/15/22 2139    Rondel Baton, MD 10/22/22 [phone]

## 2022-10-15 NOTE — Discharge Instructions (Signed)
Evaluation today was overall reassuring.  Recommend you follow-up with your PCP so that they could set up a referral to GI for you.  If you have worsening abdominal pain, develop a fever, have nausea vomiting or diarrhea or any other concerning symptom please return emerged part for evaluation.

## 2022-10-15 NOTE — Care Management (Signed)
Consult placed for PCP, he is followed By [facility] for primary care as well as Case Management through [facility]. Instructed to call them Monday for a follow up and they can refer to GI

## 2022-10-15 NOTE — ED Notes (Signed)
Patient is out for testing will update temp and vitals when return

## 2022-10-15 NOTE — ED Notes (Signed)
MD made aware of WBC 1.4

## 2022-10-15 NOTE — ED Triage Notes (Addendum)
Pt c.o abd swelling, states he had and abd infection about a month ago and had fluid drained. Pt denies significant abd pain, n/v/d. Abd is distended. States his last beer was 2-3 days ago

## 2022-10-15 NOTE — ED Provider Notes (Signed)
[place] EMERGENCY DEPARTMENT AT [facility] Provider Note   CSN: [id number] Arrival date & time: 10/15/22  5784     History  Chief Complaint  Patient presents with   Abdominal Swelling    Mathew [name] is a 42 y.o. male, history of SBP, cirrhosis, alcohol use disorder, who presents to the ED secondary to abdominal distention for the last couple days.  He states for the last couple days, his abdomen is more distended, and he would like to have a paracentesis.  He states when he comes to the hospital, they typically do a paracentesis on him.  Denies any fevers, chills, nausea, vomiting.  States he did have some abdominal pain about 4 days ago, but this is resolved.  Denies any difficulty with bowels    Home Medications Prior to Admission medications   Medication Sig Start Date End Date Taking? Authorizing Provider  ferrous sulfate 325 (65 FE) MG tablet Take 1 tablet (325 mg total) by mouth daily with breakfast. 08/17/22   [name], MD  folic acid (FOLVITE) 1 MG tablet Take 1 tablet (1 mg total) by mouth daily. 08/17/22   [name], MD  furosemide (LASIX) 20 MG tablet Take 1 tablet (20 mg total) by mouth daily. 08/17/22   [name], MD  lactulose (CHRONULAC) 10 GM/15ML solution Take 45 mLs (30 g total) by mouth every 6 (six) hours. 08/17/22   [name], MD  midodrine (PROAMATINE) 10 MG tablet Take 1 tablet (10 mg total) by mouth 3 (three) times daily with meals. 08/17/22   [name], MD  pantoprazole (PROTONIX) 40 MG tablet Take 1 tablet (40 mg total) by mouth daily at 6 (six) AM. 08/17/22   [name], MD  propranolol (INDERAL) 10 MG tablet Take 1 tablet (10 mg total) by mouth 2 (two) times daily. 08/17/22   [name], MD  sodium bicarbonate 650 MG tablet Take 1 tablet (650 mg total) by mouth 2 (two) times daily. 08/17/22   [name], MD  spironolactone (ALDACTONE) 50 MG tablet Take 1 tablet (50 mg total) by mouth  daily. 08/17/22   [name], MD  sulfamethoxazole-trimethoprim (BACTRIM DS) 800-160 MG tablet Take 1 tablet by mouth daily. 10/03/22   Raymondo Band, MD      Allergies    Patient has no known allergies.    Review of Systems   Review of Systems  Gastrointestinal:  Positive for abdominal distention. Negative for nausea and vomiting.    Physical Exam Updated Vital Signs BP 109/64 (BP Location: Right Arm)   Pulse 70   Temp 98.9 F (37.2 C)   Resp 18   SpO2 100%  Physical Exam Vitals and nursing note reviewed.  Constitutional:      General: He is not in acute distress.    Appearance: He is well-developed.  HENT:     Head: Normocephalic and atraumatic.  Eyes:     Conjunctiva/sclera: Conjunctivae normal.  Cardiovascular:     Rate and Rhythm: Normal rate and regular rhythm.     Heart sounds: No murmur heard. Pulmonary:     Effort: Pulmonary effort is normal. No respiratory distress.     Breath sounds: Normal breath sounds.  Abdominal:     Tenderness: There is no abdominal tenderness.     Comments: +ascites  Musculoskeletal:        General: No swelling.     Cervical back: Neck supple.  Skin:  General: Skin is warm and dry.     Capillary Refill: Capillary refill takes less than 2 seconds.  Neurological:     Mental Status: He is alert.  Psychiatric:        Mood and Affect: Mood normal.     ED Results / Procedures / Treatments   Labs (all labs ordered are listed, but only abnormal results are displayed) Labs Reviewed  CBC WITH DIFFERENTIAL/PLATELET - Abnormal; Notable for the following components:      Result Value   WBC 1.4 (*)    RBC 2.64 (*)    Hemoglobin 9.3 (*)    HCT 27.8 (*)    MCV 105.3 (*)    MCH 35.2 (*)    Platelets 45 (*)    Neutro Abs 0.6 (*)    Lymphs Abs 0.6 (*)    All other components within normal limits  COMPREHENSIVE METABOLIC PANEL - Abnormal; Notable for the following components:   Glucose, Bld 102 (*)    BUN <5 (*)    Calcium 7.7  (*)    Total Protein 5.5 (*)    Albumin 2.3 (*)    Total Bilirubin 3.5 (*)    All other components within normal limits  PROTIME-INR - Abnormal; Notable for the following components:   Prothrombin Time 23.4 (*)    INR 2.1 (*)    All other components within normal limits  CULTURE, BLOOD (ROUTINE X 2)  CULTURE, BLOOD (ROUTINE X 2)  BODY FLUID CULTURE W GRAM STAIN  LACTIC ACID, PLASMA  LACTIC ACID, PLASMA  PATHOLOGIST SMEAR REVIEW  BODY FLUID CELL COUNT WITH DIFFERENTIAL    EKG None  Radiology No results found.  Procedures Procedures    Medications Ordered in ED Medications - No data to display  ED Course/ Medical Decision Making/ A&P                                 Medical Decision Making Patient is a 42 year old male, here with abdominal swelling, requesting a paracentesis.  He states he comes here several times a month, to get a paracentesis, typically has an infection.  But denies any fevers, chills, abdominal pain, currently.  He states he is more distended.  Does not have insurance, so cannot get GI outpatient.  We consulted case management about this, they state that he already has a Child psychotherapist working on his issues.  Additionally we obtain labs, given his history of SBP, he has no fever, chills, abdominal pain, thus this is reassuring.    Amount and/or Complexity of Data Reviewed Labs: ordered.    Details: Neutropenic, normal lactic acid Discussion of management or test interpretation with external provider(s): Discussed with Dr. Rhunette Croft, he recommends admission given neutropenia, I spoke with Dr. Tyson Babinski, hospitalist, he states that he sees patient had neutropenia, in July, as well as February.  He recommends not admitting.  I reevaluated the patient after his paracentesis and he is feeling better.  We are waiting on cell differential to come back, with follow-up with GI.  Social work is aware and managing. Signed out to J. Roxan Hockey PA for follow-up on cell  differential counts and dispo as appropriate.    Final Clinical Impression(s) / ED Diagnoses Final diagnoses:  Neutropenia, unspecified type (HCC)  Other ascites    Rx / DC Orders ED Discharge Orders     None         Juliahna Wiswell,  [place], [place] 10/15/22 1454    Derwood Kaplan, MD 10/16/22 [phone]

## 2022-10-15 NOTE — Procedures (Signed)
PROCEDURE SUMMARY:  Successful US guided paracentesis from right lateral abdomen.  Yielded 4.2 liters of clear yellow fluid.  No immediate complications.  Patient tolerated well.  EBL = trace  Specimen was sent for labs.  Sakib Noguez S Xerxes Agrusa PA-C 10/15/2022 1:37 PM

## 2022-10-17 LAB — PATHOLOGIST SMEAR REVIEW

## 2022-10-20 LAB — CULTURE, BLOOD (ROUTINE X 2)
Culture: NO GROWTH
Special Requests: ADEQUATE

## 2022-10-20 LAB — CULTURE, BODY FLUID W GRAM STAIN -BOTTLE: Culture: NO GROWTH

## 2022-10-26 ENCOUNTER — Ambulatory Visit: Payer: Self-pay | Attending: Nurse Practitioner | Admitting: Nurse Practitioner

## 2022-10-26 ENCOUNTER — Encounter: Payer: Self-pay | Admitting: Nurse Practitioner

## 2022-10-26 VITALS — BP 117/72 | HR 88 | Ht 61.0 in | Wt 176.6 lb

## 2022-10-26 DIAGNOSIS — K7031 Alcoholic cirrhosis of liver with ascites: Secondary | ICD-10-CM

## 2022-10-26 NOTE — Progress Notes (Addendum)
Assessment & Plan:  Mathew [name] was seen today for hospitalization follow-up.  Diagnoses and all orders for this visit:  Alcoholic cirrhosis of liver with ascites (HCC) -     Ambulatory referral to Gastroenterology    Patient has been counseled on age-appropriate routine health concerns for screening and prevention. These are reviewed and up-to-date. Referrals have been placed accordingly. Immunizations are up-to-date or declined.    Subjective:   Chief Complaint  Patient presents with   Hospitalization Follow-up   HPI Mathew [name] 42 y.o. male presents to office today for HFU   He is a patient of Dr. Lynelle Doctor.   He has a past medical history of Acute metabolic encephalopathy (06/20/2021), Alcohol withdrawal seizure, Cirrhosis, ETOH abuse, Pneumonia (04/02/2020), and SBP (spontaneous bacterial peritonitis)  (06/17/2021).   Recently seen in ED for abdominal pain and distension. Required paracentesis and was discharged home in stable condition with recommendations to follow up with GI. He has been utilizing the ED for his paracentesis with last admissions  09-25-2022; 06-23-2022; 05-30-2022; 02-28-2022;11-15-2021  He will need GI referral; they can likely order OP paracentesis for him to go to ambulatory center when needed instead of overutilizing the ED.  He is requesting evaluation of umbilical hernia that has been increasing in size over the past few years. There is intermittent pain associated with the hernia based on how much he eats.   Review of Systems  Constitutional:  Negative for fever, malaise/fatigue and weight loss.  HENT: Negative.  Negative for nosebleeds.   Eyes: Negative.  Negative for blurred vision, double vision and photophobia.  Respiratory: Negative.  Negative for cough and shortness of breath.   Cardiovascular: Negative.  Negative for chest pain, palpitations and leg swelling.  Gastrointestinal: Negative.  Negative for heartburn, nausea and vomiting.   Musculoskeletal: Negative.  Negative for myalgias.  Neurological: Negative.  Negative for dizziness, focal weakness, seizures and headaches.  Psychiatric/Behavioral:  Positive for substance abuse. Negative for suicidal ideas.     Past Medical History:  Diagnosis Date   Acute metabolic encephalopathy 06/20/2021   Alcohol withdrawal seizure (HCC)    Cirrhosis (HCC)    ETOH abuse    Pneumonia 04/02/2020   SBP (spontaneous bacterial peritonitis) (HCC) 06/17/2021    Past Surgical History:  Procedure Laterality Date   COLONOSCOPY WITH PROPOFOL N/A 03/17/2018   Procedure: COLONOSCOPY WITH PROPOFOL;  Surgeon: Bernette Redbird, MD;  [facility] ENDOSCOPY;  Service: Endoscopy;  Laterality: N/A;   ESOPHAGOGASTRODUODENOSCOPY (EGD) WITH PROPOFOL N/A 03/17/2018   Procedure: ESOPHAGOGASTRODUODENOSCOPY (EGD) WITH PROPOFOL;  Surgeon: Bernette Redbird, MD;  Location: [facility] ENDOSCOPY;  Service: Endoscopy;  Laterality: N/A;   IR PARACENTESIS  03/14/2018   IR PARACENTESIS  11/18/2020   IR PARACENTESIS  06/17/2021   IR PARACENTESIS  08/30/2021   IR PARACENTESIS  09/02/2021   IR PARACENTESIS  11/16/2021   IR PARACENTESIS  03/01/2022   IR PARACENTESIS  05/31/2022   IR PARACENTESIS  06/02/2022   IR PARACENTESIS  06/23/2022   IR PARACENTESIS  07/01/2022   IR PARACENTESIS  08/18/2022   IR PARACENTESIS  09/26/2022   IR PARACENTESIS  11/07/2022    Family History  Adopted: Yes  Problem Relation Age of Onset   Cancer Neg Hx    Heart disease Neg Hx     Social History Reviewed with no changes to be made today.   Outpatient Medications Prior to Visit  Medication Sig Dispense Refill   ferrous sulfate 325 (65 FE) MG tablet Take 1 tablet (  325 mg total) by mouth daily with breakfast. 60 tablet 3   folic acid (FOLVITE) 1 MG tablet Take 1 tablet (1 mg total) by mouth daily. 60 tablet 1   furosemide (LASIX) 20 MG tablet Take 1 tablet (20 mg total) by mouth daily. 90 tablet 3   lactulose (CHRONULAC) 10 GM/15ML solution Take 45  mLs (30 g total) by mouth every 6 (six) hours. 473 mL 1   midodrine (PROAMATINE) 10 MG tablet Take 1 tablet (10 mg total) by mouth 3 (three) times daily with meals. 90 tablet 3   pantoprazole (PROTONIX) 40 MG tablet Take 1 tablet (40 mg total) by mouth daily at 6 (six) AM. 90 tablet 1   propranolol (INDERAL) 10 MG tablet Take 1 tablet (10 mg total) by mouth 2 (two) times daily. 120 tablet 2   sodium bicarbonate 650 MG tablet Take 1 tablet (650 mg total) by mouth 2 (two) times daily. 120 tablet 1   spironolactone (ALDACTONE) 50 MG tablet Take 1 tablet (50 mg total) by mouth daily. 90 tablet 3   sulfamethoxazole-trimethoprim (BACTRIM DS) 800-160 MG tablet Take 1 tablet by mouth daily. 30 tablet 0   No facility-administered medications prior to visit.    No Known Allergies     Objective:    BP 117/72 (BP Location: Left Arm, Patient Position: Sitting, Cuff Size: Normal)   Pulse 88   Ht 5\' 1"  (1.549 m)   Wt 176 lb 9.6 oz (80.1 kg)   SpO2 100%   BMI 33.37 kg/m  Wt Readings from Last 3 Encounters:  10/26/22 176 lb 9.6 oz (80.1 kg)  09/28/22 168 lb 10.4 oz (76.5 kg)  08/17/22 169 lb (76.7 kg)    Physical Exam Vitals and nursing note reviewed.  Constitutional:      Appearance: He is well-developed.  HENT:     Head: Normocephalic and atraumatic.  Cardiovascular:     Rate and Rhythm: Normal rate and regular rhythm.     Heart sounds: Normal heart sounds. No murmur heard.    No friction rub. No gallop.  Pulmonary:     Effort: Pulmonary effort is normal. No tachypnea or respiratory distress.     Breath sounds: Normal breath sounds. No decreased breath sounds, wheezing, rhonchi or rales.  Chest:     Chest wall: No tenderness.  Abdominal:     General: Bowel sounds are normal. There is distension.     Palpations: Abdomen is soft.     Hernia: A hernia is present. Hernia is present in the umbilical area.  Musculoskeletal:        General: Normal range of motion.     Cervical back:  Normal range of motion.  Skin:    General: Skin is warm and dry.  Neurological:     Mental Status: He is alert and oriented to person, place, and time.     Coordination: Coordination normal.  Psychiatric:        Behavior: Behavior normal. Behavior is cooperative.        Thought Content: Thought content normal.        Judgment: Judgment normal.          Patient has been counseled extensively about nutrition and exercise as well as the importance of adherence with medications and regular follow-up. The patient was given clear instructions to go to ER or return to medical center if symptoms don't improve, worsen or new problems develop. The patient verbalized understanding.   Follow-up: Return if symptoms  worsen or fail to improve.   Mathew Rigg, FNP-BC [facility] [place], [place] [phone]   11/09/2022, 1:23 PM

## 2022-10-26 NOTE — Progress Notes (Signed)
Hernia concerns, where belly button is located

## 2022-11-07 ENCOUNTER — Emergency Department (HOSPITAL_COMMUNITY): Payer: Self-pay

## 2022-11-07 ENCOUNTER — Encounter (HOSPITAL_COMMUNITY): Payer: Self-pay

## 2022-11-07 ENCOUNTER — Emergency Department (HOSPITAL_COMMUNITY)
Admission: EM | Admit: 2022-11-07 | Discharge: 2022-11-07 | Disposition: A | Discharge status: Home / Self Care | Payer: Self-pay | Attending: Emergency Medicine | Admitting: Emergency Medicine

## 2022-11-07 ENCOUNTER — Other Ambulatory Visit: Payer: Self-pay

## 2022-11-07 DIAGNOSIS — D696 Thrombocytopenia, unspecified: Secondary | ICD-10-CM | POA: Insufficient documentation

## 2022-11-07 DIAGNOSIS — K7031 Alcoholic cirrhosis of liver with ascites: Secondary | ICD-10-CM

## 2022-11-07 DIAGNOSIS — D72819 Decreased white blood cell count, unspecified: Secondary | ICD-10-CM | POA: Insufficient documentation

## 2022-11-07 DIAGNOSIS — R188 Other ascites: Secondary | ICD-10-CM | POA: Insufficient documentation

## 2022-11-07 HISTORY — PX: IR PARACENTESIS: IMG2679

## 2022-11-07 LAB — GRAM STAIN

## 2022-11-07 LAB — CBC WITH DIFFERENTIAL/PLATELET
Abs Immature Granulocytes: 0 10*3/uL (ref 0.00–0.07)
Basophils Absolute: 0 10*3/uL (ref 0.0–0.1)
Basophils Relative: 1 %
Eosinophils Absolute: 0.1 10*3/uL (ref 0.0–0.5)
Eosinophils Relative: 3 %
HCT: 27.7 % — ABNORMAL LOW (ref 39.0–52.0)
Hemoglobin: 9.6 g/dL — ABNORMAL LOW (ref 13.0–17.0)
Immature Granulocytes: 0 %
Lymphocytes Relative: 37 %
Lymphs Abs: 0.7 10*3/uL (ref 0.7–4.0)
MCH: 36.2 pg — ABNORMAL HIGH (ref 26.0–34.0)
MCHC: 34.7 g/dL (ref 30.0–36.0)
MCV: 104.5 fL — ABNORMAL HIGH (ref 80.0–100.0)
Monocytes Absolute: 0.3 10*3/uL (ref 0.1–1.0)
Monocytes Relative: 15 %
Neutro Abs: 0.9 10*3/uL — ABNORMAL LOW (ref 1.7–7.7)
Neutrophils Relative %: 44 %
Platelets: 52 10*3/uL — ABNORMAL LOW (ref 150–400)
RBC: 2.65 MIL/uL — ABNORMAL LOW (ref 4.22–5.81)
RDW: 13.2 % (ref 11.5–15.5)
WBC: 1.9 10*3/uL — ABNORMAL LOW (ref 4.0–10.5)
nRBC: 0 % (ref 0.0–0.2)

## 2022-11-07 LAB — BODY FLUID CELL COUNT WITH DIFFERENTIAL
Eos, Fluid: 0 %
Lymphs, Fluid: 80 %
Monocyte-Macrophage-Serous Fluid: 17 % — ABNORMAL LOW (ref 50–90)
Neutrophil Count, Fluid: 3 % (ref 0–25)
Total Nucleated Cell Count, Fluid: 78 uL (ref 0–1000)

## 2022-11-07 LAB — COMPREHENSIVE METABOLIC PANEL
ALT: 15 U/L (ref 0–44)
AST: 32 U/L (ref 15–41)
Albumin: 2.3 g/dL — ABNORMAL LOW (ref 3.5–5.0)
Alkaline Phosphatase: 148 U/L — ABNORMAL HIGH (ref 38–126)
Anion gap: 4 — ABNORMAL LOW (ref 5–15)
BUN: 5 mg/dL — ABNORMAL LOW (ref 6–20)
CO2: 21 mmol/L — ABNORMAL LOW (ref 22–32)
Calcium: 7.9 mg/dL — ABNORMAL LOW (ref 8.9–10.3)
Chloride: 107 mmol/L (ref 98–111)
Creatinine, Ser: 0.82 mg/dL (ref 0.61–1.24)
GFR, Estimated: >60 mL/min (ref >60)
Glucose, Bld: 99 mg/dL (ref 70–99)
Potassium: 4.1 mmol/L (ref 3.5–5.1)
Sodium: 132 mmol/L — ABNORMAL LOW (ref 135–145)
Total Bilirubin: 2.6 mg/dL — ABNORMAL HIGH (ref 0.3–1.2)
Total Protein: 5.8 g/dL — ABNORMAL LOW (ref 6.5–8.1)

## 2022-11-07 LAB — PROTIME-INR
INR: 1.8 — ABNORMAL HIGH (ref 0.8–1.2)
Prothrombin Time: 21.4 s — ABNORMAL HIGH (ref 11.4–15.2)

## 2022-11-07 MED ORDER — LIDOCAINE HCL 1 % IJ SOLN
20.0000 mL | Freq: Once | INTRAMUSCULAR | Status: AC
  Administered 2022-11-07: 10 mL

## 2022-11-07 MED ORDER — LIDOCAINE-EPINEPHRINE (PF) 2 %-1:200000 IJ SOLN: 10.0000 mL | Freq: Once | INTRAMUSCULAR | Status: DC

## 2022-11-07 MED ORDER — LIDOCAINE HCL 1 % IJ SOLN
INTRAMUSCULAR | Status: AC
  Filled 2022-11-07: qty 20

## 2022-11-07 NOTE — Procedures (Addendum)
PROCEDURE SUMMARY:  Successful ultrasound guided paracentesis from the right lower quadrant.  Yielded 4.4 of straw colored fluid.  No immediate complications.  The patient tolerated the procedure well.   Specimen was sent for labs.  EBL < 2mL  The patient has previously been formally evaluated by the [facility] Interventional Radiology Portal Hypertension [facility] and is being actively followed for potential future intervention.  Patient interviewed with the assistance of tablet translator service, [place].

## 2022-11-07 NOTE — ED Triage Notes (Signed)
Pt here from home requesting  fluid to be taken off his abd,, no abd pain  or n/v

## 2022-11-07 NOTE — ED Provider Notes (Signed)
EMERGENCY DEPARTMENT AT [facility] Provider Note   CSN: [id number] Arrival date & time: 11/07/22  4696     History  Chief Complaint  Patient presents with   Abdominal Pain    Mathew [name] is a 42 y.o. male.  HPI 42 year old male with a history of cirrhosis and previous SBP presents with ascites.  History is taken with the Spanish interpreter.  He is requesting paracentesis due to swelling.  He thinks his last paracentesis was a few weeks ago.  No fever, pain, shortness of breath, vomiting.  He states no one has set up any outpatient paracentesis for him.  Home Medications Prior to Admission medications   Medication Sig Start Date End Date Taking? Authorizing Provider  ferrous sulfate 325 (65 FE) MG tablet Take 1 tablet (325 mg total) by mouth daily with breakfast. 08/17/22   [name], MD  folic acid (FOLVITE) 1 MG tablet Take 1 tablet (1 mg total) by mouth daily. 08/17/22   [name], MD  furosemide (LASIX) 20 MG tablet Take 1 tablet (20 mg total) by mouth daily. 08/17/22   [name], MD  lactulose (CHRONULAC) 10 GM/15ML solution Take 45 mLs (30 g total) by mouth every 6 (six) hours. 08/17/22   [name], MD  midodrine (PROAMATINE) 10 MG tablet Take 1 tablet (10 mg total) by mouth 3 (three) times daily with meals. 08/17/22   [name], MD  pantoprazole (PROTONIX) 40 MG tablet Take 1 tablet (40 mg total) by mouth daily at 6 (six) AM. 08/17/22   [name], MD  propranolol (INDERAL) 10 MG tablet Take 1 tablet (10 mg total) by mouth 2 (two) times daily. 08/17/22   [name], MD  sodium bicarbonate 650 MG tablet Take 1 tablet (650 mg total) by mouth 2 (two) times daily. 08/17/22   [name], MD  spironolactone (ALDACTONE) 50 MG tablet Take 1 tablet (50 mg total) by mouth daily. 08/17/22   [name], MD  sulfamethoxazole-trimethoprim (BACTRIM DS) 800-160 MG tablet Take 1 tablet by mouth daily. 10/03/22    Raymondo Band, MD      Allergies    Patient has no known allergies.    Review of Systems   Review of Systems  Constitutional:  Negative for fever.  Respiratory:  Negative for shortness of breath.   Gastrointestinal:  Positive for abdominal distention. Negative for abdominal pain and vomiting.    Physical Exam Updated Vital Signs BP 117/85   Pulse 75   Temp 98.5 F (36.9 C) (Oral)   Resp 15   SpO2 100%  Physical Exam Vitals and nursing note reviewed.  Constitutional:      General: He is not in acute distress.    Appearance: He is well-developed. He is not ill-appearing or diaphoretic.  HENT:     Head: Normocephalic and atraumatic.  Cardiovascular:     Rate and Rhythm: Normal rate and regular rhythm.     Heart sounds: Normal heart sounds.  Pulmonary:     Effort: Pulmonary effort is normal.     Breath sounds: Normal breath sounds.  Abdominal:     General: There is distension.     Palpations: Abdomen is soft.     Tenderness: There is no abdominal tenderness.  Skin:    General: Skin is warm and dry.  Neurological:     Mental Status: He is alert.     ED Results / Procedures /  Treatments   Labs (all labs ordered are listed, but only abnormal results are displayed) Labs Reviewed  COMPREHENSIVE METABOLIC PANEL - Abnormal; Notable for the following components:      Result Value   Sodium 132 (*)    CO2 21 (*)    BUN 5 (*)    Calcium 7.9 (*)    Total Protein 5.8 (*)    Albumin 2.3 (*)    Alkaline Phosphatase 148 (*)    Total Bilirubin 2.6 (*)    Anion gap 4 (*)    All other components within normal limits  CBC WITH DIFFERENTIAL/PLATELET - Abnormal; Notable for the following components:   WBC 1.9 (*)    RBC 2.65 (*)    Hemoglobin 9.6 (*)    HCT 27.7 (*)    MCV 104.5 (*)    MCH 36.2 (*)    Platelets 52 (*)    Neutro Abs 0.9 (*)    All other components within normal limits  PROTIME-INR - Abnormal; Notable for the following components:   Prothrombin Time 21.4  (*)    INR 1.8 (*)    All other components within normal limits  BODY FLUID CULTURE W GRAM STAIN  BODY FLUID CULTURE W GRAM STAIN  BODY FLUID CELL COUNT WITH DIFFERENTIAL  BODY FLUID CELL COUNT WITH DIFFERENTIAL    EKG None  Radiology IR Paracentesis  Result Date: 11/07/2022 INDICATION: 42 year old male. History of alcoholic cirrhosis with recurrent ascites. Request is for therapeutic and diagnostic paracentesis. Maximum of 4 L EXAM: ULTRASOUND GUIDED THERAPEUTIC AND DIAGNOSTIC PARACENTESIS MEDICATIONS: Lidocaine 1% 10 mL COMPLICATIONS: None immediate. PROCEDURE: Informed written consent was obtained from the patient after a discussion of the risks, benefits and alternatives to treatment. A timeout was performed prior to the initiation of the procedure. Initial ultrasound scanning demonstrates a large amount of ascites within the right lower abdominal quadrant. The right lower abdomen was prepped and draped in the usual sterile fashion. 1% lidocaine was used for local anesthesia. Following this, a 19 gauge, 7-cm, Yueh catheter was introduced. An ultrasound image was saved for documentation purposes. The paracentesis was performed. The catheter was removed and a dressing was applied. The patient tolerated the procedure well without immediate post procedural complication. FINDINGS: A total of approximately 4 L of straw-colored fluid was removed. Samples were sent to the laboratory as requested by the clinical team. IMPRESSION: Successful ultrasound-guided therapeutic and diagnostic paracentesis yielding 4.4 liters of peritoneal fluid. Performed by Anders Grant NP PLAN: The patient has previously been formally evaluated by the [facility] Interventional Radiology Portal Hypertension Clinic and is being actively followed for potential future intervention. Electronically Signed   By: Marliss Coots M.D.   On: 11/07/2022 16:00    Procedures Procedures    Medications Ordered in  ED Medications  lidocaine (XYLOCAINE) 1 % (with pres) injection 20 mL (10 mLs Infiltration Given 11/07/22 1407)    ED Course/ Medical Decision Making/ A&P                                 Medical Decision Making Amount and/or Complexity of Data Reviewed Labs: ordered.    Details: Leukopenia (improved from prior). Chronic thrombocytopenia  Risk Prescription drug management.   Ultrasound paracentesis ordered for his recurrent ascites.  Chart review shows he had SBP before and was not having chest pain, he does have a leukopenia (though improved from prior).  Will check  cell count and Gram stain.  Otherwise he is well-appearing.  Care transferred to Dr. Dalene Seltzer.        Final Clinical Impression(s) / ED Diagnoses Final diagnoses:  None    Rx / DC Orders ED Discharge Orders          Ordered    US Paracentesis        11/07/22 1610              Pricilla Loveless, MD 11/07/22 [phone]

## 2022-11-09 ENCOUNTER — Encounter: Payer: Self-pay | Admitting: Nurse Practitioner

## 2022-11-11 ENCOUNTER — Telehealth: Payer: Self-pay

## 2022-11-11 NOTE — Telephone Encounter (Signed)
Called patient and gave him the address and phone number for the GI  referral    Interpreter id #Carla [phone]

## 2022-11-12 LAB — CULTURE, BODY FLUID W GRAM STAIN -BOTTLE: Culture: NO GROWTH

## 2022-11-15 ENCOUNTER — Emergency Department (HOSPITAL_COMMUNITY): Payer: Self-pay

## 2022-11-15 ENCOUNTER — Encounter (HOSPITAL_COMMUNITY): Payer: Self-pay | Admitting: Emergency Medicine

## 2022-11-15 ENCOUNTER — Other Ambulatory Visit: Payer: Self-pay

## 2022-11-15 ENCOUNTER — Emergency Department (HOSPITAL_COMMUNITY)
Admission: EM | Admit: 2022-11-15 | Discharge: 2022-11-15 | Disposition: A | Discharge status: Home / Self Care | Payer: Self-pay | Attending: Emergency Medicine | Admitting: Emergency Medicine

## 2022-11-15 DIAGNOSIS — K7031 Alcoholic cirrhosis of liver with ascites: Secondary | ICD-10-CM | POA: Insufficient documentation

## 2022-11-15 DIAGNOSIS — D61818 Other pancytopenia: Secondary | ICD-10-CM | POA: Insufficient documentation

## 2022-11-15 DIAGNOSIS — R2243 Localized swelling, mass and lump, lower limb, bilateral: Secondary | ICD-10-CM | POA: Insufficient documentation

## 2022-11-15 DIAGNOSIS — Z79899 Other long term (current) drug therapy: Secondary | ICD-10-CM | POA: Insufficient documentation

## 2022-11-15 DIAGNOSIS — D696 Thrombocytopenia, unspecified: Secondary | ICD-10-CM | POA: Insufficient documentation

## 2022-11-15 DIAGNOSIS — K469 Unspecified abdominal hernia without obstruction or gangrene: Secondary | ICD-10-CM | POA: Insufficient documentation

## 2022-11-15 LAB — CBC WITH DIFFERENTIAL/PLATELET
Abs Immature Granulocytes: 0.01 10*3/uL (ref 0.00–0.07)
Basophils Absolute: 0 10*3/uL (ref 0.0–0.1)
Basophils Relative: 0 %
Eosinophils Absolute: 0.1 10*3/uL (ref 0.0–0.5)
Eosinophils Relative: 2 %
HCT: 29.4 % — ABNORMAL LOW (ref 39.0–52.0)
Hemoglobin: 10 g/dL — ABNORMAL LOW (ref 13.0–17.0)
Immature Granulocytes: 0 %
Lymphocytes Relative: 30 %
Lymphs Abs: 0.7 10*3/uL (ref 0.7–4.0)
MCH: 35.8 pg — ABNORMAL HIGH (ref 26.0–34.0)
MCHC: 34 g/dL (ref 30.0–36.0)
MCV: 105.4 fL — ABNORMAL HIGH (ref 80.0–100.0)
Monocytes Absolute: 0.3 10*3/uL (ref 0.1–1.0)
Monocytes Relative: 14 %
Neutro Abs: 1.2 10*3/uL — ABNORMAL LOW (ref 1.7–7.7)
Neutrophils Relative %: 54 %
Platelets: 55 10*3/uL — ABNORMAL LOW (ref 150–400)
RBC: 2.79 MIL/uL — ABNORMAL LOW (ref 4.22–5.81)
RDW: 13.2 % (ref 11.5–15.5)
WBC: 2.3 10*3/uL — ABNORMAL LOW (ref 4.0–10.5)
nRBC: 0 % (ref 0.0–0.2)

## 2022-11-15 LAB — URINALYSIS, ROUTINE W REFLEX MICROSCOPIC
Bilirubin Urine: NEGATIVE
Glucose, UA: NEGATIVE mg/dL
Ketones, ur: NEGATIVE mg/dL
Nitrite: NEGATIVE
Protein, ur: NEGATIVE mg/dL
Specific Gravity, Urine: 1.017 (ref 1.005–1.030)
pH: 5 (ref 5.0–8.0)

## 2022-11-15 LAB — COMPREHENSIVE METABOLIC PANEL
ALT: 21 U/L (ref 0–44)
AST: 41 U/L (ref 15–41)
Albumin: 2.5 g/dL — ABNORMAL LOW (ref 3.5–5.0)
Alkaline Phosphatase: 136 U/L — ABNORMAL HIGH (ref 38–126)
Anion gap: 8 (ref 5–15)
BUN: <5 mg/dL — ABNORMAL LOW (ref 6–20)
CO2: 18 mmol/L — ABNORMAL LOW (ref 22–32)
Calcium: 8.1 mg/dL — ABNORMAL LOW (ref 8.9–10.3)
Chloride: 105 mmol/L (ref 98–111)
Creatinine, Ser: 0.49 mg/dL — ABNORMAL LOW (ref 0.61–1.24)
GFR, Estimated: >60 mL/min (ref >60)
Glucose, Bld: 111 mg/dL — ABNORMAL HIGH (ref 70–99)
Potassium: 3.7 mmol/L (ref 3.5–5.1)
Sodium: 131 mmol/L — ABNORMAL LOW (ref 135–145)
Total Bilirubin: 4.3 mg/dL — ABNORMAL HIGH (ref <1.2)
Total Protein: 6.4 g/dL — ABNORMAL LOW (ref 6.5–8.1)

## 2022-11-15 LAB — PROTIME-INR
INR: 1.7 — ABNORMAL HIGH (ref 0.8–1.2)
Prothrombin Time: 20.3 s — ABNORMAL HIGH (ref 11.4–15.2)

## 2022-11-15 LAB — GRAM STAIN

## 2022-11-15 LAB — BODY FLUID CELL COUNT WITH DIFFERENTIAL
Lymphs, Fluid: 54 %
Monocyte-Macrophage-Serous Fluid: 44 % — ABNORMAL LOW (ref 50–90)
Neutrophil Count, Fluid: 2 % (ref 0–25)
Total Nucleated Cell Count, Fluid: 57 uL (ref 0–1000)

## 2022-11-15 LAB — GLUCOSE, PLEURAL OR PERITONEAL FLUID: Glucose, Fluid: 128 mg/dL

## 2022-11-15 LAB — LIPASE, BLOOD: Lipase: 43 U/L (ref 11–51)

## 2022-11-15 LAB — PROTEIN, PLEURAL OR PERITONEAL FLUID: Total protein, fluid: <3 g/dL

## 2022-11-15 MED ORDER — LIDOCAINE HCL 1 % IJ SOLN
INTRAMUSCULAR | Status: AC
  Filled 2022-11-15: qty 20

## 2022-11-15 MED ORDER — ALBUMIN HUMAN 25 % IV SOLN
75.0000 g | Freq: Once | INTRAVENOUS | Status: AC
  Administered 2022-11-15: 75 g via INTRAVENOUS
  Filled 2022-11-15: qty 300

## 2022-11-15 NOTE — ED Provider Notes (Signed)
[place] EMERGENCY DEPARTMENT AT [facility] Provider Note   CSN: [id number] Arrival date & time: 11/15/22  1246     History  Chief Complaint  Patient presents with   Abdominal Swelling    Mathew [name] is a 42 y.o. male.  Level 5 caveat for language barrier.  Translator used.  Patient with a history of alcoholic cirrhosis here with abdominal distention and pain.  States he has been progressively worsening over the past 3 to 4 days.  Last paracentesis was October 28.  Denies fever, chills, nausea or vomiting.  No pain with urination or blood in the urine.  He has not yet followed up with GI.  Did have 1 alcoholic drink last night but does not drink on a regular basis anymore.  Requesting a paracentesis at this time.  He gets them about monthly.  The history is provided by the patient. The history is limited by a language barrier. A language interpreter was used.       Home Medications Prior to Admission medications   Medication Sig Start Date End Date Taking? Authorizing Provider  ferrous sulfate 325 (65 FE) MG tablet Take 1 tablet (325 mg total) by mouth daily with breakfast. 08/17/22   [name], MD  folic acid (FOLVITE) 1 MG tablet Take 1 tablet (1 mg total) by mouth daily. 08/17/22   [name], MD  furosemide (LASIX) 20 MG tablet Take 1 tablet (20 mg total) by mouth daily. 08/17/22   [name], MD  lactulose (CHRONULAC) 10 GM/15ML solution Take 45 mLs (30 g total) by mouth every 6 (six) hours. 08/17/22   [name], MD  midodrine (PROAMATINE) 10 MG tablet Take 1 tablet (10 mg total) by mouth 3 (three) times daily with meals. 08/17/22   [name], MD  pantoprazole (PROTONIX) 40 MG tablet Take 1 tablet (40 mg total) by mouth daily at 6 (six) AM. 08/17/22   [name], MD  propranolol (INDERAL) 10 MG tablet Take 1 tablet (10 mg total) by mouth 2 (two) times daily. 08/17/22   [name], MD  sodium bicarbonate 650 MG  tablet Take 1 tablet (650 mg total) by mouth 2 (two) times daily. 08/17/22   [name], MD  spironolactone (ALDACTONE) 50 MG tablet Take 1 tablet (50 mg total) by mouth daily. 08/17/22   [name], MD  sulfamethoxazole-trimethoprim (BACTRIM DS) 800-160 MG tablet Take 1 tablet by mouth daily. 10/03/22   Raymondo Band, MD      Allergies    Patient has no known allergies.    Review of Systems   Review of Systems  Constitutional:  Negative for activity change, appetite change and fever.  HENT:  Negative for congestion and rhinorrhea.   Respiratory:  Negative for cough and shortness of breath.   Cardiovascular:  Negative for chest pain.  Gastrointestinal:  Positive for abdominal distention and abdominal pain. Negative for nausea and vomiting.  Genitourinary:  Negative for dysuria and hematuria.  Musculoskeletal:  Negative for arthralgias and myalgias.  Skin:  Negative for wound.  Neurological:  Negative for dizziness, weakness, light-headedness and headaches.   all other systems are negative except as noted in the HPI and PMH.    Physical Exam Updated Vital Signs BP 123/70   Pulse 98   Resp 16   SpO2 99%  Physical Exam Vitals and nursing note reviewed.  Constitutional:      General: He is not  in acute distress.    Appearance: He is well-developed.  HENT:     Head: Normocephalic and atraumatic.     Mouth/Throat:     Pharynx: No oropharyngeal exudate.  Eyes:     Conjunctiva/sclera: Conjunctivae normal.     Pupils: Pupils are equal, round, and reactive to light.  Neck:     Comments: No meningismus. Cardiovascular:     Rate and Rhythm: Normal rate and regular rhythm.     Heart sounds: Normal heart sounds. No murmur heard. Pulmonary:     Effort: Pulmonary effort is normal. No respiratory distress.     Breath sounds: Normal breath sounds.  Abdominal:     General: There is distension.     Palpations: Abdomen is soft.     Tenderness: There is abdominal tenderness. There  is no guarding or rebound.     Hernia: A hernia is present.     Comments: Ascites present, distended abdomen with reducible umbilical hernia with some overlying ulceration  Musculoskeletal:        General: No tenderness. Normal range of motion.     Cervical back: Normal range of motion and neck supple.     Right lower leg: Edema present.     Left lower leg: Edema present.  Skin:    General: Skin is warm.  Neurological:     Mental Status: He is alert and oriented to person, place, and time.     Cranial Nerves: No cranial nerve deficit.     Motor: No abnormal muscle tone.     Coordination: Coordination normal.     Comments:  5/5 strength throughout. CN 2-12 intact.Equal grip strength.   Psychiatric:        Behavior: Behavior normal.     ED Results / Procedures / Treatments   Labs (all labs ordered are listed, but only abnormal results are displayed) Labs Reviewed  CBC WITH DIFFERENTIAL/PLATELET - Abnormal; Notable for the following components:      Result Value   WBC 2.3 (*)    RBC 2.79 (*)    Hemoglobin 10.0 (*)    HCT 29.4 (*)    MCV 105.4 (*)    MCH 35.8 (*)    Platelets 55 (*)    Neutro Abs 1.2 (*)    All other components within normal limits  COMPREHENSIVE METABOLIC PANEL - Abnormal; Notable for the following components:   Sodium 131 (*)    CO2 18 (*)    Glucose, Bld 111 (*)    BUN <5 (*)    Creatinine, Ser 0.49 (*)    Calcium 8.1 (*)    Total Protein 6.4 (*)    Albumin 2.5 (*)    Alkaline Phosphatase 136 (*)    Total Bilirubin 4.3 (*)    All other components within normal limits  PROTIME-INR - Abnormal; Notable for the following components:   Prothrombin Time 20.3 (*)    INR 1.7 (*)    All other components within normal limits  BODY FLUID CULTURE W GRAM STAIN  LIPASE, BLOOD  URINALYSIS, ROUTINE W REFLEX MICROSCOPIC  BODY FLUID CELL COUNT WITH DIFFERENTIAL  PROTEIN, PLEURAL OR PERITONEAL FLUID  GLUCOSE, PLEURAL OR PERITONEAL FLUID     EKG None  Radiology No results found.  Procedures Procedures    Medications Ordered in ED Medications - No data to display  ED Course/ Medical Decision Making/ A&P Clinical Course as of 11/15/22 1532  Tue Nov 15, 2022  1519 Alcoholic cirrhosis, getting para, if  looks okay can DC with GI follow up [JD]    Clinical Course User Index [JD] Laurence Spates, MD                                 Medical Decision Making Amount and/or Complexity of Data Reviewed Labs: ordered. Decision-making details documented in ED Course. Radiology: ordered and independent interpretation performed. Decision-making details documented in ED Course. ECG/medicine tests: ordered and independent interpretation performed. Decision-making details documented in ED Course.   Recurrent ascites without fever, chills, nausea, vomiting or shortness of breath. No fever.  Abdomen soft without peritoneal signs.  Low suspicion for SBP.  Screening labs will be obtained.  Will need paracentesis therapeutically.  Labs show stable pancytopenia and thrombocytopenia.  Bilirubin similar to previous in the 3-4 range.  It appear as patient has not followed up with GI and continues to drink alcohol.  Paracentesis will be performed.  Anticipate discharge if peritoneal fluid results are reassuring.  Dr. Earlene Plater to assume care at shift change.        Final Clinical Impression(s) / ED Diagnoses Final diagnoses:  None    Rx / DC Orders ED Discharge Orders     None         Gittel Mccamish, Jeannett Senior, MD 11/15/22 1533

## 2022-11-15 NOTE — ED Triage Notes (Signed)
Per [facility] pt coming from home c/o abdominal pain and swelling. Requesting a paracentesis- had one done 10/28 Abdomen very distended and has umbilical hernia. Drank alcohol last night.

## 2022-11-15 NOTE — Procedures (Addendum)
PROCEDURE SUMMARY:  Successful ultrasound guided paracentesis from the left lower abdomen.   Yielded 10 L of clear yellow fluid.  No immediate complications.  The patient tolerated the procedure well.   Specimen was sent for labs.  EBL < 1 mL  The patient has previously been formally evaluated by the [facility] and is being actively followed for potential future intervention.   Kennieth Francois PA-C 11/15/2022 3:07 PM

## 2022-11-15 NOTE — Discharge Instructions (Addendum)
Stop drinking alcohol.  Follow-up with your gastroenterologist for further evaluation of your liver disease and cirrhosis and ascites.  Return to the ED with worsening abdominal pain, fever, difficulty breathing or any concerns.

## 2022-11-17 LAB — PATHOLOGIST SMEAR REVIEW

## 2022-11-18 ENCOUNTER — Encounter: Payer: Self-pay | Admitting: Gastroenterology

## 2022-11-18 ENCOUNTER — Ambulatory Visit (INDEPENDENT_AMBULATORY_CARE_PROVIDER_SITE_OTHER): Payer: Self-pay | Admitting: Gastroenterology

## 2022-11-18 ENCOUNTER — Telehealth: Payer: Self-pay

## 2022-11-18 VITALS — BP 118/62 | HR 97 | Ht 65.0 in | Wt 177.0 lb

## 2022-11-18 DIAGNOSIS — K7031 Alcoholic cirrhosis of liver with ascites: Secondary | ICD-10-CM

## 2022-11-18 DIAGNOSIS — F1090 Alcohol use, unspecified, uncomplicated: Secondary | ICD-10-CM

## 2022-11-18 MED ORDER — SULFAMETHOXAZOLE-TRIMETHOPRIM 800-160 MG PO TABS: 1.0000 | ORAL_TABLET | Freq: Every day | ORAL | 0 refills | Status: DC

## 2022-11-18 MED ORDER — MIDODRINE HCL 10 MG PO TABS: 10.0000 mg | ORAL_TABLET | Freq: Three times a day (TID) | ORAL | 0 refills | Status: DC

## 2022-11-18 MED ORDER — SPIRONOLACTONE 50 MG PO TABS: 50.0000 mg | ORAL_TABLET | Freq: Every day | ORAL | 0 refills | Status: DC

## 2022-11-18 MED ORDER — LACTULOSE 10 GM/15ML PO SOLN: 30.0000 g | Freq: Two times a day (BID) | ORAL | 1 refills | Status: DC

## 2022-11-18 MED ORDER — FUROSEMIDE 20 MG PO TABS: 20.0000 mg | ORAL_TABLET | Freq: Every day | ORAL | 0 refills | Status: DC

## 2022-11-18 NOTE — Patient Instructions (Signed)
STOP ALCOHOL COMPLETELY  Please follow up a low sodium diet. Information below  We have sent the following medications to your pharmacy for you to pick up at your convenience: Bactrim : Take once a day Lasix: Take once a day Aldactone: Take once  a day Midodrine: Take three times a day with meals Lactulose 45 cc: Take twice a day  Hold propranolol.   We will be in touch regarding lab work that you need.  Thank you for entrusting me with your care and for choosing [facility], Dr. Ileene Patrick   __________________________________________________________  Plan de alimentacin con bajo contenido de sodio Low-Sodium Eating Plan La sal (sodio) le ayuda a Pharmacologist un equilibrio saludable de los lquidos corporales. Demasiado sodio puede aumentar la presin arterial. Tambin puede hacer que el cuerpo retenga lquidos y desechos. Su mdico o nutricionista podran recomendarle un plan de alimentacin con bajo contenido de sodio si tiene presin arterial alta (hipertensin), enfermedad renal, enfermedad heptica o insuficiencia cardaca. El consumo de una menor cantidad de sodio puede ayudar a disminuir la presin arterial y a Building services engineer. Tambin puede proteger el corazn, el hgado y los riones. Consejos para seguir Consulting civil engineer las etiquetas de los alimentos  Verifique la cantidad de sodio por porcin en las etiquetas de los alimentos. Si come ms de una porcin, debe multiplicar la cantidad indicada de sodio por la cantidad de porciones. Elija alimentos con menos de 140 miligramos (mg) de sodio por porcin. Evite consumir alimentos que tengan 300 mg de sodio o ms por porcin. Siempre controle la cantidad de sodio que contiene un producto, incluso si la etiqueta dice "sin sal" o "sin sal agregada". Al ir de compras  Compre productos en los que en su etiqueta diga: "bajo contenido de sodio" o "sin sal agregada". Compre alimentos frescos. Evite los alimentos  enlatados y comidas precocidas o congeladas. Evite las carnes enlatadas, curadas o procesadas. Compre panes que tengan menos de 80 mg de sodio por rebanada. Al cocinar  Coma ms comidas caseras. Trate de comer menos comida de restaurantes, bufs y comida rpida. Trate de no agregar sal cuando cocine. Use hierbas o aderezos sin sal, en lugar de sal de mesa o sal marina. Consulte al mdico o farmacutico antes de usar sustitutos de la sal. Cocine con aceites de origen vegetal, como el de canola, girasol u [place]. Planificacin de las comidas Cuando coma en un restaurante, pida que preparen su comida con menos sal, o bien, sin nada de sal. Evite los platos etiquetados como en salmuera, encurtidos, curados o ahumados. Evite los platos elaborados con salsa de soja, miso o salsa teriyaki. Evite alimentos que contengan glutamato monosdico (GMS). El MSG a veces se agrega a algunas comidas de restaurante, salsas, sopas, caldos y alimentos enlatados. Prepare comidas que puedan ser [place], [place], hervidas, asadas o al vapor. Generalmente, se elaboran con menos sodio. Informacin general Trate de limitar la ingesta de sodio de 1,500 a 2,300 mg por da, o la cantidad [employer] haya indicado el mdico. Qu alimentos debo comer? Nils Pyle Frutas frescas, congeladas o enlatadas. Jugo de frutas. Verduras Verduras frescas o congeladas. Verduras enlatadas "sin sal agregada". Salsa de tomate y pastas "sin sal agregada". Jugos de tomate y verduras con bajo contenido de sodio o reducidos en sodio. Cereales Cereales con bajo contenido de sodio, como [place], arroz y trigo inflados, y trigo triturado. Galletas con bajo contenido de [place]. Arroz sin sal. Pastas sin sal. Pan con bajo contenido de sodio.  Panes y pastas integrales. Carnes y [address] protenas Carne de vaca o ave, pescado y frutos de mar frescos o congelados. Estos no deben tener sal agregada. Atn y salmn enlatado con bajo contenido de [place]. Frutos secos  sin sal. Lentejas, frijoles y guisantes secos, sin sal agregada. Frijoles enlatados sin sal. Huevos. Mantequillas de frutos secos sin sal. Lcteos Leche. Leche de soja. Quesos que, por naturaleza, tengan bajo contenido de sodio, por ejemplo, la ricota, la mozzarella fresca o el queso suizo. Quesos con contenido bajo o reducido de sodio. Queso crema. Yogur. Alios y condimentos Hierbas y especias frescas y secas. Aderezos sin sal. Mostaza y ktchup con bajo contenido de [place]. Aderezos para ensalada sin sodio. Mayonesa light sin sodio. Rbano picante fresco o refrigerado. Jugo de limn. Vinagre. Otros alimentos [place] caseras o con contenido de sodio bajo o reducido. Palomitas de maz y pretzels sin sal. Papas fritas sin sal o con bajo contenido de sal. Es posible que los productos que se enumeran ms arriba no sean todos los alimentos y las bebidas que puede consumir. Consulte a un nutricionista para obtener ms informacin. Qu alimentos debo evitar? Verduras [place], verduras en escabeche y salsas. Aceitunas. Papas fritas. Anillos de cebollas. Verduras enlatadas regulares, excepto aquellas que sean con bajo contenido de sodio o reducidas en sodio. Pasta y salsa de tomate en lata comunes. Jugos comunes de tomate y de verduras. Verduras Hydrologist. Cereales Cereales instantneos para comer calientes. Mezclas para bizcochos, panqueques y rellenos de pan. Crutones. Mezclas para pastas o arroz con condimento. Envases comerciales de sopa de fideos. Macarrones con queso envasados o congelados. Galletas saladas comunes. Harina leudante. Carnes y [address] de vaca o pescado que est salada, [place], [place], condimentada o en escabeche. Carne precocinada o curada, como embutidos o panes de carne. [place]. Jamn. Pepperoni. Perros calientes. Carne en conserva. Carne picada de buey. Cerdo salado. Cecina o charqui. Arenque en escabeche, anchoas y sardinas. Atn enlatado comn. Frutos secos  con sal. Celine Mans para untar y quesos procesados. Quesos duros. Requesn. Queso azul. Queso feta. Queso en hebras. Queso cottage comn. Suero de [place]. Leche enlatada. Grasas y aceites Mantequilla con sal. Margarina comn. Mantequilla clarificada. Grasa de panceta. Alios y condimentos Sal de cebolla, sal de ajo, sal condimentada, sal de mesa y sal marina. Salsas en lata y envasadas. Salsa [place]. Salsa trtara. Salsa barbacoa. Salsa teriyaki. Salsa de soja, incluso la que tiene contenido reducido de [place]. Salsa de carne. Salsa de pescado. Salsa de [place]. Salsa rosada. Rbano picante envasado. [place] y [employer]. Saborizantes y tiernizantes para carne. Caldo en cubitos. Salsa picante. Adobos preelaborados o envasados. Aderezos para tacos preelaborados o envasados. Salsas. Aderezos comunes para ensalada. Salsa. Otros alimentos Palomitas de maz y pretzels con sal. Maz inflado y frituras de maz. Nachos y papas fritas envasadas. Sopas enlatadas o en polvo. Pizza. Pasteles y entradas congeladas. Es posible que los productos que se enumeran ms arriba no sean todos los alimentos y las bebidas que Personnel officer. Consulte a un nutricionista para obtener ms informacin. Esta informacin no tiene Theme park manager el consejo del mdico. Asegrese de hacerle al mdico cualquier pregunta que tenga. Document Revised: 02/04/2022 Document Reviewed: 02/04/2022 Elsevier Patient Education  2024 [employer].

## 2022-11-18 NOTE — Progress Notes (Signed)
HPI :  42 year old male here to follow-up from hospitalization and establish with me for further care of his cirrhosis.  I saw him in the hospital in May 2024.  Recall he has a history of decompensated alcoholic cirrhosis.  He has had a very complicated course with multiple hospitalizations this year.  I saw him in May when he was admitted with SBP.  He has problems with recurrent ascites leading to repeated paracentesis.  He has esophageal varices, documented on prior EGD few years ago, as well as concern for hepatic encephalopathy in the past.  This is his first time to our office.  We had tried to coordinate office follow-up after his hospitalization in May but he has not been able to come in.  He has had repeated hospitalizations.  He was most recently hospitalized in mid September for E. coli bacteremia.  Recall he has had resistant E. coli when hospitalized for prior SBP for which no oral antibiotics would cover.  His prophylaxis has been to prophylax against the more common causes of SBP.  He was referred to [facility] hepatology back in May but he never made it to that appointment.  He is accompanied by a translator today.  I reviewed his medication list.  He states he is not taking any medications.  He has run out of all of them, does not know which ones he is supposed to take and does not have enough money to afford them.  He does not have any insurance.  He states he is working with someone for "assistance" but is unable to provide any details to me for that.    He has been in the ED for recurrent ascites, he has had repeated paracentesis, just had one a few days ago with 10 L removed.  No evidence of SBP.  He feels tight in his abdomen.  He has some mild lower extremity edema.  He has no bleeding symptoms.  His mental status appears appropriate today.  He denies any cardiopulmonary symptoms.  His main complaint is an umbilical hernia and he asks if he can see a surgeon to repair it.  I  questioned him about his alcohol use and he states he drank a beer yesterday.  He states he has been drinking periodically, but less volume than previous.  We had a lengthy discussion about this.  I have reviewed his prior lab workup and I do not see any workup for other causes of chronic liver disease.  He is supposed to be taking Lasix and Aldactone for his edema/ascites, he is not taking it.  He is not taking his Bactrim for SBP prophylaxis.  He is not taking midodrine.  He was on low-dose propranolol for varices prophylaxis, not taking it.  He is not taking any lactulose.  He is not following a low-sodium diet.   On review of most recent labs a few days ago his INR was 1.7, bilirubin 4.3, alk phos 136, AST 41, ALT 21.  Albumin 2.5.  BUN and creatinine normal.  Platelet count of 55.  Hemoglobin 10.0.  Last set of imaging with a CT abdomen pelvis with contrast in September which showed no hepatomas.  Previous GI workup EGD 03/2018 inpatient for IDA with Dr. Matthias Hughs: Grade 2 esophageal varices, 2 cm hiatal hernia, portal hypertensive gastropathy, friable gastric mucosa, normal duodenum.   Colonoscopy 03/2018 for IDA: Friable colonic mucosa, no specimens collected    Paracentesis 11/15/22 - 10 L removed   CT abdomen /  pelvis 09/26/22 -  IMPRESSION: 1. Cirrhosis with large volume simple ascites. 2. No bowel obstruction. The umbilical hernia contains ascitic fluid. 3. Cholelithiasis. 4. Small left pleural effusion.    Past Medical History:  Diagnosis Date   Acute metabolic encephalopathy 06/20/2021   Alcohol withdrawal seizure (HCC)    Cirrhosis (HCC)    ETOH abuse    Pneumonia 04/02/2020   SBP (spontaneous bacterial peritonitis) (HCC) 06/17/2021     Past Surgical History:  Procedure Laterality Date   COLONOSCOPY WITH PROPOFOL N/A 03/17/2018   Procedure: COLONOSCOPY WITH PROPOFOL;  Surgeon: Bernette Redbird, MD;  Location: [facility] ENDOSCOPY;  Service: Endoscopy;  Laterality: N/A;    ESOPHAGOGASTRODUODENOSCOPY (EGD) WITH PROPOFOL N/A 03/17/2018   Procedure: ESOPHAGOGASTRODUODENOSCOPY (EGD) WITH PROPOFOL;  Surgeon: Bernette Redbird, MD;  Location: [facility] ENDOSCOPY;  Service: Endoscopy;  Laterality: N/A;   IR PARACENTESIS  03/14/2018   IR PARACENTESIS  11/18/2020   IR PARACENTESIS  06/17/2021   IR PARACENTESIS  08/30/2021   IR PARACENTESIS  09/02/2021   IR PARACENTESIS  11/16/2021   IR PARACENTESIS  03/01/2022   IR PARACENTESIS  05/31/2022   IR PARACENTESIS  06/02/2022   IR PARACENTESIS  06/23/2022   IR PARACENTESIS  07/01/2022   IR PARACENTESIS  08/18/2022   IR PARACENTESIS  09/26/2022   IR PARACENTESIS  11/07/2022   Family History  Adopted: Yes  Problem Relation Age of Onset   Liver disease Neg Hx    Esophageal cancer Neg Hx    Colon cancer Neg Hx    Social History   Tobacco Use   Smoking status: Every Day    Current packs/day: 0.25    Average packs/day: 0.3 packs/day for 3.3 years (0.8 ttl pk-yrs)    Types: Cigarettes    Start date: 08/11/2019   Smokeless tobacco: Never  Vaping Use   Vaping status: Never Used  Substance Use Topics   Alcohol use: Yes    Alcohol/week: 2.0 standard drinks of alcohol    Types: 2 Cans of beer per week    Comment: 3-6 beer cans a week   Drug use: No   Current Outpatient Medications  Medication Sig Dispense Refill   ferrous sulfate 325 (65 FE) MG tablet Take 1 tablet (325 mg total) by mouth daily with breakfast. 60 tablet 3   folic acid (FOLVITE) 1 MG tablet Take 1 tablet (1 mg total) by mouth daily. 60 tablet 1   pantoprazole (PROTONIX) 40 MG tablet Take 1 tablet (40 mg total) by mouth daily at 6 (six) AM. 90 tablet 1   sodium bicarbonate 650 MG tablet Take 1 tablet (650 mg total) by mouth 2 (two) times daily. 120 tablet 1   furosemide (LASIX) 20 MG tablet Take 1 tablet (20 mg total) by mouth daily. 90 tablet 0   lactulose (CHRONULAC) 10 GM/15ML solution Take 45 mLs (30 g total) by mouth 2 (two) times daily. 473 mL 1   midodrine (PROAMATINE)  10 MG tablet Take 1 tablet (10 mg total) by mouth 3 (three) times daily with meals. 270 tablet 0   propranolol (INDERAL) 10 MG tablet Take 1 tablet (10 mg total) by mouth 2 (two) times daily. (Patient not taking: Reported on 11/18/2022) 120 tablet 2   spironolactone (ALDACTONE) 50 MG tablet Take 1 tablet (50 mg total) by mouth daily. 90 tablet 0   sulfamethoxazole-trimethoprim (BACTRIM DS) 800-160 MG tablet Take 1 tablet by mouth daily. 90 tablet 0   No current facility-administered medications for this visit.  No Known Allergies   Review of Systems: All systems reviewed and negative except where noted in HPI.    US Paracentesis  Result Date: 11/15/2022 INDICATION: Alcoholic cirrhosis with recurrent ascites. Request received for therapeutic and diagnostic paracentesis. EXAM: ULTRASOUND GUIDED  PARACENTESIS MEDICATIONS: 8 cc 1% lidocaine COMPLICATIONS: None immediate. PROCEDURE: Informed written consent was obtained from the patient after a discussion of the risks, benefits and alternatives to treatment. A timeout was performed prior to the initiation of the procedure. Initial ultrasound scanning demonstrates a large amount of ascites within the left lower abdominal quadrant. The left lower abdomen was prepped and draped in the usual sterile fashion. 1% lidocaine was used for local anesthesia. Following this, a 19 gauge, 7-cm, Yueh catheter was introduced. An ultrasound image was saved for documentation purposes. The paracentesis was performed. The catheter was removed and a dressing was applied. The patient tolerated the procedure well without immediate post procedural complication. Patient received post-procedure intravenous albumin; see nursing notes for details. FINDINGS: A total of approximately 10 L of clear yellow fluid was removed. Samples were sent to the laboratory as requested by the clinical team. IMPRESSION: Successful ultrasound-guided paracentesis yielding 10 liters of peritoneal fluid.  PLAN: The patient has previously been formally evaluated by the [facility] Interventional Radiology Portal Hypertension [facility] and is being actively followed for potential future intervention. Procedure performed by Mina Marble, PA-C Electronically Signed   By: Judie Petit.  Shick M.D.   On: 11/15/2022 16:00   IR Paracentesis  Result Date: 11/07/2022 INDICATION: 42 year old male. History of alcoholic cirrhosis with recurrent ascites. Request is for therapeutic and diagnostic paracentesis. Maximum of 4 L EXAM: ULTRASOUND GUIDED THERAPEUTIC AND DIAGNOSTIC PARACENTESIS MEDICATIONS: Lidocaine 1% 10 mL COMPLICATIONS: None immediate. PROCEDURE: Informed written consent was obtained from the patient after a discussion of the risks, benefits and alternatives to treatment. A timeout was performed prior to the initiation of the procedure. Initial ultrasound scanning demonstrates a large amount of ascites within the right lower abdominal quadrant. The right lower abdomen was prepped and draped in the usual sterile fashion. 1% lidocaine was used for local anesthesia. Following this, a 19 gauge, 7-cm, Yueh catheter was introduced. An ultrasound image was saved for documentation purposes. The paracentesis was performed. The catheter was removed and a dressing was applied. The patient tolerated the procedure well without immediate post procedural complication. FINDINGS: A total of approximately 4 L of straw-colored fluid was removed. Samples were sent to the laboratory as requested by the clinical team. IMPRESSION: Successful ultrasound-guided therapeutic and diagnostic paracentesis yielding 4.4 liters of peritoneal fluid. Performed by Anders Grant NP PLAN: The patient has previously been formally evaluated by the [facility] Interventional Radiology Portal Hypertension Clinic and is being actively followed for potential future intervention. Electronically Signed   By: Marliss Coots M.D.   On: 11/07/2022 16:00    Lab  Results  Component Value Date   WBC 2.3 (L) 11/15/2022   HGB 10.0 (L) 11/15/2022   HCT 29.4 (L) 11/15/2022   MCV 105.4 (H) 11/15/2022   PLT 55 (L) 11/15/2022    Lab Results  Component Value Date   NA 131 (L) 11/15/2022   CL 105 11/15/2022   K 3.7 11/15/2022   CO2 18 (L) 11/15/2022   BUN <5 (L) 11/15/2022   CREATININE 0.49 (L) 11/15/2022   GFRNONAA >60 11/15/2022   CALCIUM 8.1 (L) 11/15/2022   PHOS 3.6 09/25/2022   ALBUMIN 2.5 (L) 11/15/2022   GLUCOSE 111 (H) 11/15/2022  Lab Results  Component Value Date   ALT 21 11/15/2022   AST 41 11/15/2022   ALKPHOS 136 (H) 11/15/2022   BILITOT 4.3 (H) 11/15/2022    Lab Results  Component Value Date   INR 1.7 (H) 11/15/2022   INR 1.8 (H) 11/07/2022   INR 2.1 (H) 10/15/2022    MELD 3.0: 22 at 11/15/2022  1:09 PM MELD-Na: 22 at 11/15/2022  1:09 PM Calculated from: Serum Creatinine: 0.49 mg/dL (Using min of 1 mg/dL) at 40/09/8117  1:47 PM Serum Sodium: 131 mmol/L at 11/15/2022  1:09 PM Total Bilirubin: 4.3 mg/dL at 82/09/5619  3:08 PM Serum Albumin: 2.5 g/dL at 65/07/8467  6:29 PM INR(ratio): 1.7 at 11/15/2022  1:09 PM Age at listing (hypothetical): 42 years Sex: Male at 11/15/2022  1:09 PM    Physical Exam: BP 118/62   Pulse 97   Ht 5\' 5"  (1.651 m)   Wt 177 lb (80.3 kg)   BMI 29.45 kg/m  Constitutional: Pleasant,male in no acute distress ,jaundiced Abdominal: Soft, distended with ascites, nontender. Periumbilical hernia. There are no masses palpable.  Extremities: (+) edema Neurological: Alert and oriented to person place and time. (-) Asterixis Skin: Skin is warm and dry. No rashes noted. Psychiatric: Normal mood and affect. Behavior is normal.   ASSESSMENT: 42 y.o. male here for assessment of the following  1. Alcoholic cirrhosis of liver with ascites (HCC)   2. Alcohol use disorder    Had a lengthy visit with the patient today and translator to review his history and plans moving forward.  He has decompensated  cirrhosis likely from alcohol although on review of his labs I can't see that he has ever had a workup for other chronic liver diseases which is warranted.  His main symptom is persistent ascites, he is had complications with SBP and bacteremia, he is not taking his antibiotics or diuretics.  He has a history of varices remotely, no bleeding recently.  His MELD is 22.  I a frank discussion with him in regards to the seriousness of his liver disease with him.  He is actively drinking and I had a lengthy discussion with him about the risks of continued drinking and if he has any chance to recover from this he needs to absolutely stop drinking alcohol.  High risk for mortality with continued alcohol use.  Aside from complete alcohol abstinence, he needs to take his medications.  Specifically, it is very important he take his daily antibiotic for SBP prophylaxis and diuretics to help with his volume status.  I do not think he should be on propranolol right now given his refractory ascites and would stop that.  I think midodrine would help him tolerate the diuretics better.  He does not appear overtly encephalopathic today but I do not think he would take lactulose with given compliance issues.  His insight into his disease process appears poor and I spent a lot of time with him today explaining how severe this is and what is needed to be done for him to recover.  The first major issue is that he has no insurance and is unable to get any medications currently.  The second issue is that he has no transportation has a hard time getting to doctors offices and to the pharmacy to get his medication.  I spoke with our staff and I am going to try to refer him to [facility] / social worker to help him get insurance.  It essential he get the resources  he need to get medications to treat his liver disease.  If he can get insurance, I will refer him to hepatologist for transplant evaluation although he understands he is not a  candidate for transplant as long as he continues to drink and if he does not have insurance.  He also needs a follow-up endoscopy to reassess his varices, he cannot afford that right now without insurance.  He is not a good candidate for beta-blocker given his refractory ascites, especially as we tried to start diuretics in the near future.  He may need banding of his varices based on their appearance and risk for bleeding.  He otherwise needs full serologic workup for chronic liver diseases when he is able to come to the lab.  He will need short follow-up to assess renal function once he starts his diuretics.  He seems motivated to stop drinking and get the help he needs although he has been counseled to stop drinking alcohol numerous times from review of his chart and I told him this back in May during his hospitalization.  Hopefully we can get the resources he needs to get his medications and stop the cycle of repeated ED visits and hospitalization.   PLAN: - complete alcohol abstinence - low Na diet, handouts provided - referral to [facility] for social work support, assistance getting insurance - want to start the following meds if / when he can afford them or get insurance: Bactrim DS 1 tab daily  Lasix 20mg  / day  Aldactone 50mg  / day  Midodrine 10mg  TID  Lactulose 10mg m/22ml - 45cc BID Hold propranolol currently due to refractory ascites - needs an EGD - without insurance and transportation, can't schedule currently - needs hepatology consult - no insurance, can't schedule - once he is stable to start diuretics, will have him come in for a repeat BMET one week after that and titrate up diuretics as he tolerates - needs basic labs done for all chronic liver disease and immunity to hep A / B, can do once he has insurance - we will try to touch base with support staff at [facility] to see how we can get this patient assistance he needs for medications and follow-up.  Without this support I anticipate he  will continue to have repeated ED visits and hospitalization, high risk for mortality especially if he continues to drink - our staff will follow up with him next week for an update on progress with this.  70 minutes spent with patient, reviewing chart, documenting this encounter.   Harlin Rain, MD [facility]  CC: [name], MD

## 2022-11-18 NOTE — Telephone Encounter (Signed)
Referral sent to [facility] for help getting medicaid, transportation, financial assistance,.Marland KitchenMarland Kitchen

## 2022-11-20 LAB — CULTURE, BODY FLUID W GRAM STAIN -BOTTLE: Culture: NO GROWTH

## 2022-11-21 ENCOUNTER — Other Ambulatory Visit: Payer: Self-pay

## 2022-11-21 ENCOUNTER — Telehealth: Payer: Self-pay | Admitting: *Deleted

## 2022-11-21 NOTE — Progress Notes (Unsigned)
  Care Coordination  Outreach Note  11/21/2022 Name: Rory Swatek [name]-C[place] MRN: [id number] DOB: 01/28/1980   Care Coordination Outreach Attempts:  An unsuccessful telephone outreach was attempted today to offer the patient information about available care coordination services using [employer] [phone] named Camila. Follow Up Plan:  Additional outreach attempts will be made to offer the patient care coordination information and services.   Encounter Outcome:  No Answer  Gwenevere Ghazi  Care Coordination Care Guide  Direct Dial: [phone]

## 2022-11-22 ENCOUNTER — Telehealth: Payer: Self-pay

## 2022-11-22 ENCOUNTER — Other Ambulatory Visit: Payer: Self-pay

## 2022-11-22 MED ORDER — SULFAMETHOXAZOLE-TRIMETHOPRIM 800-160 MG PO TABS
1.0000 | ORAL_TABLET | Freq: Every day | ORAL | 0 refills | Status: DC
  Filled 2022-11-22: qty 90, 90d supply, fill #0

## 2022-11-22 MED ORDER — FUROSEMIDE 20 MG PO TABS
20.0000 mg | ORAL_TABLET | Freq: Every day | ORAL | 0 refills | Status: DC
  Filled 2022-11-22: qty 90, 90d supply, fill #0

## 2022-11-22 MED ORDER — SPIRONOLACTONE 50 MG PO TABS
50.0000 mg | ORAL_TABLET | Freq: Every day | ORAL | 0 refills | Status: DC
  Filled 2022-11-22 – 2022-11-23 (×2): qty 90, 90d supply, fill #0

## 2022-11-22 MED ORDER — LACTULOSE 10 GM/15ML PO SOLN
30.0000 g | Freq: Two times a day (BID) | ORAL | 1 refills | Status: DC
  Filled 2022-11-22: qty 473, 5d supply, fill #0

## 2022-11-22 MED ORDER — MIDODRINE HCL 10 MG PO TABS
10.0000 mg | ORAL_TABLET | Freq: Three times a day (TID) | ORAL | 0 refills | Status: DC
  Filled 2022-11-22: qty 90, 30d supply, fill #0

## 2022-11-22 NOTE — Telephone Encounter (Signed)
Message received from [name] regarding concerns that patient is uninsured, needs assistance with obtaining medications, needs transportation resources, needs support to better understand his disease processes.    I contacted [facility] Lango counselor regarding financial assistance options with [facility]. He said that the patient has a pending Medicaid application and it can take 45 days to determine if he is eligible for Medicaid. He cannot apply for [employer] or the [employer] until he receives a Medicaid denial.  Mikle Bosworth was able to provide me with the contact information for the Medicaid caseworker: Julianne Handler : [phone] and I will call her to follow up on the status of the application.  As far as medications, he had been approved for assistance at the [facility] at [place] and was eligible to receive his medications at a discount through the [facility]). At this time, he will need to re-enroll for [facility] and will have to speak with one of the pharmacy techs when he comes to pick up medications or I can ask them to reach out to him Currently some medications are no cost and some may be $4.  If they are not able to get a medication at a reduced price they usually reach out to the provider to see if there is an option to substitute the medication for one of the discounted meds. I was informed that the largest co-pay may be $10.   The pharmacy will also allow the patient to charge medications to an account that they can pay off at another time and they offer delivery service.   I asked Mardee Postin, Pharmacy Tech, if the patient was eligible for a one time free fill of his medications and she said he is eligible. This would allow him to get all of his medications and allow him time to complete applications for [facility]   For transportation to medical appointments, [facility] clinics are able to request a cab ride for the patient to/from medical  appointments when the patient has no other option.   We can provide the rides to appointments at [facility]; but each clinic would need to arrange rides for appointments at their respective clinics.We also have bus passes for patients.   A Palliative care referral may be beneficial to help him gain more insight into his disease process.   I called the patient with assistance of Spanish Interpreter: [phone]/[employer] and explained about the resources available to assist him.   He understands that a Medicaid application is pending and I told him that I will contact the caseworker to check the status of the application  and inquire if there is anything else they need from him at this point.   I explained that we are working on getting him all of his medications for no charge this one time. I told him that I will call him tomorrow morning and let him know when they are ready. I explained that it would be best for him to come to the pharmacy and pick them up because he could then apply for [facility] assistance.  This would be easier for him than doing it over the phone with an interpreter. He agreed and said he will work on getting transportation to the pharmacy. He has the address for the pharmacy and understands the all medications are at [facility].  I also told him that if he needs rides to his medical appointments at [facility] clinics, and he has  no way to get there, the clinic he is going to can arrange cab rides for him.  We want to make sure he can get the care he needs.  We also discussed that he will need to establish care with a new PCP as Dr [name], his prior PCP, has retired.   He was very appreciative of the assistance and I told him again that I would speak with him tomorrow.

## 2022-11-22 NOTE — Telephone Encounter (Signed)
Resent medications to [facility] on [address].

## 2022-11-22 NOTE — Progress Notes (Unsigned)
  Care Coordination  Outreach Note  11/22/2022 Name: Mathew [name] MRN: [id number] DOB: 10/05/1980   Care Coordination Outreach Attempts: A second unsuccessful outreach was attempted today to offer the patient with information about available care coordination services. Using [employer] [phone] named [name].   Follow Up Plan:  Additional outreach attempts will be made to offer the patient care coordination information and services.   Encounter Outcome:  No Answer  Gwenevere Ghazi  Care Coordination Care Guide  Direct Dial: [phone]

## 2022-11-23 ENCOUNTER — Other Ambulatory Visit: Payer: Self-pay

## 2022-11-23 NOTE — Telephone Encounter (Signed)
I called the patient with assistance of Spanish Interpreter: [phone]/[employer] and informed him that his meds are ready for pick up and I can meet him at the pharmacy.  He said he would be there are 1430.

## 2022-11-23 NOTE — Telephone Encounter (Signed)
I think can continue the ferrous sulfate and folic acid.  Can stop pantoprazole (history of SBP) and the bicarbonate.  Thanks

## 2022-11-23 NOTE — Telephone Encounter (Signed)
[name] thank you very much for all of your work and assisting this patient, we really appreciate it.  To clarify his med list as I changed it during our office visit, he should be on the following: Bactrim DS 1 tab daily  Lasix 20mg  / day  Aldactone 50mg  / day  Midodrine 10mg  TID  Lactulose 10mg m/19ml - 45cc BID STOPPING propranolol currently due to refractory ascites  Once he can get and start these medications, recommend he come to our office for a lab draw - BMET - Jan if you can make sure that is in the system.  Hopefully we can accomplish this for him initially. Next steps, once he gets insurance, would be additional lab workup, EGD, hepatology consultation, etc.   Thanks again for all of your help.

## 2022-11-23 NOTE — Telephone Encounter (Signed)
I spoke to Julianne Handler, DSS Medicaid case worker, who stated that his Medicaid application  is still pending with a 3rd Party Agency, [employer].  She is not aware of any other documentation that is needed from the patient.  She also noted that this application may only be for coverage of his hospital charges

## 2022-11-23 NOTE — Telephone Encounter (Signed)
I met with the patient when he came to pick up his medications today at the pharmacy. [name], [facility], interpreted.   He received a 90 days supply of all prescribed medications free of charge.  He brought his AVS with medication list from his appointment with [name] on 11/18/2022. We reviewed the list and answered all of his questions. He did not receive any propanolol and he said that he understands that he is not to take any if he has it at home and he pointed to the instructions on his AVS to hold that medication.  He also understands that he is to stop the pantoprazole and sodium bicarbonate. He said he is able to manage the medications on his own and he does not need a reminder or want to set them up in a pill box. We stressed the importance of compliance with taking these meds and abstaining from alcohol.   Mathew [name] assisted him with completing an application for the [facility] so he can receive most medications as no cost depending on their inventory.  He is aware that the Medicaid application is pending and if denied, he will need to speak to the financial counselor to apply for [employer].   He said his brother will be able to take him to most appointments, I explained that it is very important to keep his appts and if he has a problem with transportation to call the clinic and request a cab ride.   We scheduled him to establish care with a new PCP, Dr [name],  at [facility] on 12/23/2022.  We also reviewed the phone numbers for [facility] and [facility] and instructed him to call with any questions/concerns.  He said he understood and was very appreciative of all of the help.

## 2022-11-24 ENCOUNTER — Other Ambulatory Visit: Payer: Self-pay

## 2022-11-24 DIAGNOSIS — K7031 Alcoholic cirrhosis of liver with ascites: Secondary | ICD-10-CM

## 2022-11-24 NOTE — Telephone Encounter (Signed)
Thank you very much, this is great to hear, really appreciate your help with his case.  Jan, can you remind him next week to come in for lab draw? End of week would be fine. Thanks

## 2022-11-24 NOTE — Progress Notes (Signed)
  Care Coordination   Note   11/24/2022 Name: Gin Rubley [name]-Cordoba MRN: [id number] DOB: 1980/11/17  Shneur Ulrich [name]-Cordoba is a 42 y.o. year old male who sees Pcp, No for primary care. I reached out to [employer] [name]-Cordoba by phone today using [employer] [phone] named Sharlet Salina to offer care coordination services.  [name] was given information about Care Coordination services today including:   The Care Coordination services include support from the care team which includes your Nurse Coordinator, Clinical Social Worker, or Pharmacist.  The Care Coordination team is here to help remove barriers to the health concerns and goals most important to you. Care Coordination services are voluntary, and the patient may decline or stop services at any time by request to their care team member.   Care Coordination Consent Status: Patient agreed to services and verbal consent obtained.   Follow up plan:  Telephone appointment with care coordination team member scheduled for:  11/19 with RNCM and 11-27 with SW  Encounter Outcome:  Patient Scheduled  [facility] Coordination Care Guide  Direct Dial: [phone]

## 2022-11-24 NOTE — Progress Notes (Signed)
Patient needs BMET end of next week per SA

## 2022-11-28 ENCOUNTER — Other Ambulatory Visit: Payer: Self-pay

## 2022-11-28 ENCOUNTER — Encounter (HOSPITAL_COMMUNITY): Payer: Self-pay | Admitting: Emergency Medicine

## 2022-11-28 ENCOUNTER — Observation Stay (HOSPITAL_COMMUNITY)
Admission: EM | Admit: 2022-11-28 | Discharge: 2022-11-29 | Disposition: A | Discharge status: Home / Self Care | Payer: Self-pay | Attending: Family Medicine | Admitting: Family Medicine

## 2022-11-28 DIAGNOSIS — D61818 Other pancytopenia: Secondary | ICD-10-CM | POA: Diagnosis present

## 2022-11-28 DIAGNOSIS — D689 Coagulation defect, unspecified: Secondary | ICD-10-CM | POA: Diagnosis present

## 2022-11-28 DIAGNOSIS — Z79899 Other long term (current) drug therapy: Secondary | ICD-10-CM | POA: Insufficient documentation

## 2022-11-28 DIAGNOSIS — F17210 Nicotine dependence, cigarettes, uncomplicated: Secondary | ICD-10-CM | POA: Insufficient documentation

## 2022-11-28 DIAGNOSIS — K429 Umbilical hernia without obstruction or gangrene: Secondary | ICD-10-CM | POA: Insufficient documentation

## 2022-11-28 DIAGNOSIS — K7290 Hepatic failure, unspecified without coma: Secondary | ICD-10-CM | POA: Diagnosis present

## 2022-11-28 DIAGNOSIS — E876 Hypokalemia: Secondary | ICD-10-CM | POA: Diagnosis present

## 2022-11-28 DIAGNOSIS — K7031 Alcoholic cirrhosis of liver with ascites: Principal | ICD-10-CM | POA: Insufficient documentation

## 2022-11-28 DIAGNOSIS — R188 Other ascites: Secondary | ICD-10-CM | POA: Diagnosis present

## 2022-11-28 DIAGNOSIS — E871 Hypo-osmolality and hyponatremia: Secondary | ICD-10-CM | POA: Diagnosis present

## 2022-11-28 DIAGNOSIS — E669 Obesity, unspecified: Secondary | ICD-10-CM | POA: Diagnosis present

## 2022-11-28 LAB — CBC WITH DIFFERENTIAL/PLATELET
Abs Immature Granulocytes: 0 10*3/uL (ref 0.00–0.07)
Basophils Absolute: 0 10*3/uL (ref 0.0–0.1)
Basophils Relative: 0 %
Eosinophils Absolute: 0 10*3/uL (ref 0.0–0.5)
Eosinophils Relative: 1 %
HCT: 27.1 % — ABNORMAL LOW (ref 39.0–52.0)
Hemoglobin: 9.7 g/dL — ABNORMAL LOW (ref 13.0–17.0)
Immature Granulocytes: 0 %
Lymphocytes Relative: 27 %
Lymphs Abs: 0.6 10*3/uL — ABNORMAL LOW (ref 0.7–4.0)
MCH: 36.9 pg — ABNORMAL HIGH (ref 26.0–34.0)
MCHC: 35.8 g/dL (ref 30.0–36.0)
MCV: 103 fL — ABNORMAL HIGH (ref 80.0–100.0)
Monocytes Absolute: 0.3 10*3/uL (ref 0.1–1.0)
Monocytes Relative: 14 %
Neutro Abs: 1.3 10*3/uL — ABNORMAL LOW (ref 1.7–7.7)
Neutrophils Relative %: 58 %
Platelets: 54 10*3/uL — ABNORMAL LOW (ref 150–400)
RBC: 2.63 MIL/uL — ABNORMAL LOW (ref 4.22–5.81)
RDW: 13.5 % (ref 11.5–15.5)
WBC: 2.2 10*3/uL — ABNORMAL LOW (ref 4.0–10.5)
nRBC: 0 % (ref 0.0–0.2)

## 2022-11-28 LAB — COMPREHENSIVE METABOLIC PANEL
ALT: 17 U/L (ref 0–44)
AST: 38 U/L (ref 15–41)
Albumin: 2.9 g/dL — ABNORMAL LOW (ref 3.5–5.0)
Alkaline Phosphatase: 120 U/L (ref 38–126)
Anion gap: 8 (ref 5–15)
BUN: 9 mg/dL (ref 6–20)
CO2: 19 mmol/L — ABNORMAL LOW (ref 22–32)
Calcium: 8.3 mg/dL — ABNORMAL LOW (ref 8.9–10.3)
Chloride: 101 mmol/L (ref 98–111)
Creatinine, Ser: 0.69 mg/dL (ref 0.61–1.24)
GFR, Estimated: >60 mL/min (ref >60)
Glucose, Bld: 137 mg/dL — ABNORMAL HIGH (ref 70–99)
Potassium: 3.3 mmol/L — ABNORMAL LOW (ref 3.5–5.1)
Sodium: 128 mmol/L — ABNORMAL LOW (ref 135–145)
Total Bilirubin: 4.1 mg/dL — ABNORMAL HIGH (ref <1.2)
Total Protein: 6.5 g/dL (ref 6.5–8.1)

## 2022-11-28 LAB — PROTIME-INR
INR: 1.9 — ABNORMAL HIGH (ref 0.8–1.2)
Prothrombin Time: 22.1 s — ABNORMAL HIGH (ref 11.4–15.2)

## 2022-11-28 LAB — LIPASE, BLOOD: Lipase: 44 U/L (ref 11–51)

## 2022-11-28 LAB — MAGNESIUM: Magnesium: 2 mg/dL (ref 1.7–2.4)

## 2022-11-28 LAB — OSMOLALITY: Osmolality: 277 mosm/kg (ref 275–295)

## 2022-11-28 MED ORDER — SULFAMETHOXAZOLE-TRIMETHOPRIM 800-160 MG PO TABS
1.0000 | ORAL_TABLET | Freq: Every day | ORAL | Status: DC
  Administered 2022-11-29: 1 via ORAL
  Filled 2022-11-28: qty 1

## 2022-11-28 MED ORDER — SODIUM CHLORIDE 0.9 % IV SOLN
2.0000 g | Freq: Once | INTRAVENOUS | Status: AC
  Administered 2022-11-28: 2 g via INTRAVENOUS
  Filled 2022-11-28: qty 20

## 2022-11-28 MED ORDER — POLYETHYLENE GLYCOL 3350 17 G PO PACK: 17.0000 g | PACK | Freq: Every day | ORAL | Status: DC | PRN

## 2022-11-28 MED ORDER — THIAMINE HCL 100 MG/ML IJ SOLN
100.0000 mg | Freq: Every day | INTRAMUSCULAR | Status: DC
  Administered 2022-11-28 – 2022-11-29 (×2): 100 mg via INTRAVENOUS
  Filled 2022-11-28 (×2): qty 2

## 2022-11-28 MED ORDER — OXYCODONE HCL 5 MG PO TABS
5.0000 mg | ORAL_TABLET | ORAL | Status: DC | PRN
  Administered 2022-11-28: 5 mg via ORAL
  Filled 2022-11-28: qty 1

## 2022-11-28 MED ORDER — FOLIC ACID 1 MG PO TABS
1.0000 mg | ORAL_TABLET | Freq: Every day | ORAL | Status: DC
  Administered 2022-11-28 – 2022-11-29 (×2): 1 mg via ORAL
  Filled 2022-11-28 (×2): qty 1

## 2022-11-28 MED ORDER — FERROUS SULFATE 325 (65 FE) MG PO TABS
325.0000 mg | ORAL_TABLET | Freq: Every day | ORAL | Status: DC
  Administered 2022-11-29: 325 mg via ORAL
  Filled 2022-11-28: qty 1

## 2022-11-28 MED ORDER — POTASSIUM CHLORIDE 20 MEQ PO PACK
40.0000 meq | PACK | Freq: Once | ORAL | Status: AC
  Administered 2022-11-28: 40 meq via ORAL
  Filled 2022-11-28: qty 2

## 2022-11-28 MED ORDER — ALBUMIN HUMAN 25 % IV SOLN
75.0000 g | Freq: Once | INTRAVENOUS | Status: AC
  Administered 2022-11-28: 75 g via INTRAVENOUS
  Filled 2022-11-28: qty 300

## 2022-11-28 MED ORDER — FUROSEMIDE 20 MG PO TABS
20.0000 mg | ORAL_TABLET | Freq: Every day | ORAL | Status: DC
  Administered 2022-11-28 – 2022-11-29 (×2): 20 mg via ORAL
  Filled 2022-11-28 (×2): qty 1

## 2022-11-28 MED ORDER — ONDANSETRON HCL 4 MG/2ML IJ SOLN
4.0000 mg | Freq: Once | INTRAMUSCULAR | Status: AC
  Administered 2022-11-28: 4 mg via INTRAVENOUS
  Filled 2022-11-28: qty 2

## 2022-11-28 MED ORDER — SPIRONOLACTONE 25 MG PO TABS
50.0000 mg | ORAL_TABLET | Freq: Every day | ORAL | Status: DC
  Administered 2022-11-28 – 2022-11-29 (×2): 50 mg via ORAL
  Filled 2022-11-28 (×2): qty 2

## 2022-11-28 MED ORDER — MORPHINE SULFATE (PF) 4 MG/ML IV SOLN
4.0000 mg | Freq: Once | INTRAVENOUS | Status: AC
Start: 2022-11-28 — End: 2022-11-28
  Administered 2022-11-28: 4 mg via INTRAVENOUS
  Filled 2022-11-28: qty 1

## 2022-11-28 MED ORDER — MIDODRINE HCL 5 MG PO TABS
10.0000 mg | ORAL_TABLET | Freq: Three times a day (TID) | ORAL | Status: DC
  Administered 2022-11-29: 10 mg via ORAL
  Filled 2022-11-28: qty 2

## 2022-11-28 MED ORDER — LACTULOSE 10 GM/15ML PO SOLN
30.0000 g | Freq: Two times a day (BID) | ORAL | Status: DC
  Administered 2022-11-28 – 2022-11-29 (×2): 30 g via ORAL
  Filled 2022-11-28 (×2): qty 60

## 2022-11-28 MED ORDER — FENTANYL CITRATE PF 50 MCG/ML IJ SOSY
50.0000 ug | PREFILLED_SYRINGE | Freq: Once | INTRAMUSCULAR | Status: AC
  Administered 2022-11-28: 50 ug via INTRAVENOUS
  Filled 2022-11-28: qty 1

## 2022-11-28 MED ORDER — FUROSEMIDE 10 MG/ML IJ SOLN
20.0000 mg | Freq: Once | INTRAMUSCULAR | Status: AC
  Administered 2022-11-28: 20 mg via INTRAVENOUS
  Filled 2022-11-28: qty 2

## 2022-11-28 MED ORDER — SODIUM CHLORIDE 0.9% FLUSH
3.0000 mL | Freq: Two times a day (BID) | INTRAVENOUS | Status: DC
  Administered 2022-11-28 – 2022-11-29 (×2): 3 mL via INTRAVENOUS

## 2022-11-28 MED ORDER — FENTANYL CITRATE PF 50 MCG/ML IJ SOSY: 25.0000 ug | PREFILLED_SYRINGE | Freq: Once | INTRAMUSCULAR | Status: DC | PRN

## 2022-11-28 NOTE — Assessment & Plan Note (Addendum)
Alcohol liver disease, recurrent tonsillitis.  Prior history of SBP.  Although clinically at this time given no leukocytosis or fever SBP is not suspected I think it is best to cover empirically with ceftriaxone till we have ascitic fluid sampling from IR.  Further I will give the patient Lasix to reduce ascitic accumulation.  Maintain the patient on fluid restriction.  Patient already on IR schedule for tomorrow.  Patient on chronic bactrim ppx.

## 2022-11-28 NOTE — ED Triage Notes (Signed)
Pt BIB EMS from home, c/o genearlized abdominal pain x 3-4 days. Noted with large abdominal hernia, hx of cirrhosis.  BP 154/76 P 100 SpO2 99% ra CBG 139

## 2022-11-28 NOTE — H&P (Signed)
History and Physical    Patient: Mathew [name] VHQ:[id number] DOB: Jan 29, 1980 DOA: 11/28/2022 DOS: the patient was seen and examined on 11/28/2022 PCP: Pcp, No  Patient coming from: Home  Chief Complaint:  Chief Complaint  Patient presents with   Abdominal Pain   HPI: Mathew [name] is a 42 y.o. male with medical history significant of known alcoholic liver disease with decompensated cirrhosis.  Unfortunately patient continues to use alcohol still.  He has previously had spontaneous bacterial peritonitis.  The of record indicates that the patient presents to the ER approximately every 7 to 14 days with complaints of abdominal distention requiring repeated paracentesis.  ER attending advised me that patient actually ran out of his medications at home several days ago, however patient denies this to me.  Regardless patient reports that for the last 4 days he is having progressive abdominal distention and discomfort diffusely.  That has gotten so worse today that he had to come to the ER.  Patient reports that 3 days ago he had a bout of vomiting and diarrhea that has still self resolved.  Without any fevers.  Patient denies any dysuria or trauma any bleeding or melena.  Patient came to the ER for abdominal distention and discomfort as above.  Unfortunately attempts to get an IR paracentesis today failed due to booked schedule.  Patient is on schedule for tomorrow.  Workup as noted below.  Medical evaluation is sought.  Patient was interviewed via [place] translator [phone] as well as in English directly as patient initially declined need for an interpreter. Review of Systems: As mentioned in the history of present illness. All other systems reviewed and are negative. Past Medical History:  Diagnosis Date   Acute metabolic encephalopathy 06/20/2021   Alcohol withdrawal seizure (HCC)    Cirrhosis (HCC)    ETOH abuse    Pneumonia 04/02/2020   SBP (spontaneous bacterial  peritonitis) (HCC) 06/17/2021   Past Surgical History:  Procedure Laterality Date   COLONOSCOPY WITH PROPOFOL N/A 03/17/2018   Procedure: COLONOSCOPY WITH PROPOFOL;  Surgeon: Bernette Redbird, MD;  Location: [facility] ENDOSCOPY;  Service: Endoscopy;  Laterality: N/A;   ESOPHAGOGASTRODUODENOSCOPY (EGD) WITH PROPOFOL N/A 03/17/2018   Procedure: ESOPHAGOGASTRODUODENOSCOPY (EGD) WITH PROPOFOL;  Surgeon: Bernette Redbird, MD;  Location: [facility] ENDOSCOPY;  Service: Endoscopy;  Laterality: N/A;   IR PARACENTESIS  03/14/2018   IR PARACENTESIS  11/18/2020   IR PARACENTESIS  06/17/2021   IR PARACENTESIS  08/30/2021   IR PARACENTESIS  09/02/2021   IR PARACENTESIS  11/16/2021   IR PARACENTESIS  03/01/2022   IR PARACENTESIS  05/31/2022   IR PARACENTESIS  06/02/2022   IR PARACENTESIS  06/23/2022   IR PARACENTESIS  07/01/2022   IR PARACENTESIS  08/18/2022   IR PARACENTESIS  09/26/2022   IR PARACENTESIS  11/07/2022   Social History:  reports that he has been smoking cigarettes. He started smoking about 3 years ago. He has a 0.8 pack-year smoking history. He has never used smokeless tobacco. He reports current alcohol use of about 2.0 standard drinks of alcohol per week. He reports that he does not use drugs. Was advised by Thereasa Parkin to cease alcohol use No Known Allergies  Family History  Adopted: Yes  Problem Relation Age of Onset   Liver disease Neg Hx    Esophageal cancer Neg Hx    Colon cancer Neg Hx     Prior to Admission medications   Medication Sig Start Date End Date Taking? Authorizing Provider  ferrous sulfate  325 (65 FE) MG tablet Take 1 tablet (325 mg total) by mouth daily with breakfast. 08/17/22  Yes [name], MD  folic acid (FOLVITE) 1 MG tablet Take 1 tablet (1 mg total) by mouth daily. 08/17/22  Yes [name], MD  furosemide (LASIX) 20 MG tablet Take 1 tablet (20 mg total) by mouth daily. 11/22/22  Yes Armbruster, Willaim Rayas, MD  lactulose (CHRONULAC) 10 GM/15ML solution Take 45 mLs (30 g total) by  mouth 2 (two) times daily. 11/22/22  Yes Armbruster, Willaim Rayas, MD  midodrine (PROAMATINE) 10 MG tablet Take 1 tablet (10 mg total) by mouth 3 (three) times daily with meals. 11/22/22  Yes Armbruster, Willaim Rayas, MD  spironolactone (ALDACTONE) 50 MG tablet Take 1 tablet (50 mg total) by mouth daily. 11/22/22  Yes Armbruster, Willaim Rayas, MD  sulfamethoxazole-trimethoprim (BACTRIM DS) 800-160 MG tablet Take 1 tablet by mouth daily. 11/22/22  Yes Armbruster, Willaim Rayas, MD    Physical Exam: Vitals:   11/28/22 1700 11/28/22 1730 11/28/22 1805 11/28/22 1859  BP: 134/84 (!) 140/86  126/85  Pulse: 90 (!) 105  97  Resp:  18  20  Temp:   98 F (36.7 C) 98.3 F (36.8 C)  TempSrc:    Oral  SpO2: 99% 100%  100%   General: Patient with bitemporal and buccal fat wasting.  Appears to be in mild painful distress from abdominal discomfort Otherwise no physiologic distress apparent on room air Respiratory exam: Bilateral intravesicular Cardiovascular exam S1-S2 normal, flow systolic murmur heard Abdomen distended with prominent abdominal vein, umbilical eversion.  Diffuse tenderness on palpation appears to be rather firm.  Bowel sounds are diminished Extremities warm without edema no focal motor deficit. Data Reviewed:  Labs on Admission:  Results for orders placed or performed during the hospital encounter of 11/28/22 (from the past 24 hour(s))  CBC with Differential     Status: Abnormal   Collection Time: 11/28/22  8:57 AM  Result Value Ref Range   WBC 2.2 (L) 4.0 - 10.5 K/uL   RBC 2.63 (L) 4.22 - 5.81 MIL/uL   Hemoglobin 9.7 (L) 13.0 - 17.0 g/dL   HCT 16.1 (L) 09.6 - 04.5 %   MCV 103.0 (H) 80.0 - 100.0 fL   MCH 36.9 (H) 26.0 - 34.0 pg   MCHC 35.8 30.0 - 36.0 g/dL   RDW 40.9 81.1 - 91.4 %   Platelets 54 (L) 150 - 400 K/uL   nRBC 0.0 0.0 - 0.2 %   Neutrophils Relative % 58 %   Neutro Abs 1.3 (L) 1.7 - 7.7 K/uL   Lymphocytes Relative 27 %   Lymphs Abs 0.6 (L) 0.7 - 4.0 K/uL   Monocytes Relative  14 %   Monocytes Absolute 0.3 0.1 - 1.0 K/uL   Eosinophils Relative 1 %   Eosinophils Absolute 0.0 0.0 - 0.5 K/uL   Basophils Relative 0 %   Basophils Absolute 0.0 0.0 - 0.1 K/uL   Immature Granulocytes 0 %   Abs Immature Granulocytes 0.00 0.00 - 0.07 K/uL  Comprehensive metabolic panel     Status: Abnormal   Collection Time: 11/28/22  8:57 AM  Result Value Ref Range   Sodium 128 (L) 135 - 145 mmol/L   Potassium 3.3 (L) 3.5 - 5.1 mmol/L   Chloride 101 98 - 111 mmol/L   CO2 19 (L) 22 - 32 mmol/L   Glucose, Bld 137 (H) 70 - 99 mg/dL   BUN 9 6 - 20 mg/dL  Creatinine, Ser 0.69 0.61 - 1.24 mg/dL   Calcium 8.3 (L) 8.9 - 10.3 mg/dL   Total Protein 6.5 6.5 - 8.1 g/dL   Albumin 2.9 (L) 3.5 - 5.0 g/dL   AST 38 15 - 41 U/L   ALT 17 0 - 44 U/L   Alkaline Phosphatase 120 38 - 126 U/L   Total Bilirubin 4.1 (H) <1.2 mg/dL   GFR, Estimated >16 >10 mL/min   Anion gap 8 5 - 15  Lipase, blood     Status: None   Collection Time: 11/28/22  8:57 AM  Result Value Ref Range   Lipase 44 11 - 51 U/L  Protime-INR     Status: Abnormal   Collection Time: 11/28/22  5:55 PM  Result Value Ref Range   Prothrombin Time 22.1 (H) 11.4 - 15.2 seconds   INR 1.9 (H) 0.8 - 1.2   Basic Metabolic Panel: Recent Labs  Lab 11/28/22 0857  NA 128*  K 3.3*  CL 101  CO2 19*  GLUCOSE 137*  BUN 9  CREATININE 0.69  CALCIUM 8.3*   Liver Function Tests: Recent Labs  Lab 11/28/22 0857  AST 38  ALT 17  ALKPHOS 120  BILITOT 4.1*  PROT 6.5  ALBUMIN 2.9*   Recent Labs  Lab 11/28/22 0857  LIPASE 44   No results for input(s): "AMMONIA" in the last 168 hours. CBC: Recent Labs  Lab 11/28/22 0857  WBC 2.2*  NEUTROABS 1.3*  HGB 9.7*  HCT 27.1*  MCV 103.0*  PLT 54*   Cardiac Enzymes: No results for input(s): "CKTOTAL", "CKMB", "CKMBINDEX", "TROPONINIHS" in the last 168 hours.  BNP (last 3 results) No results for input(s): "PROBNP" in the last 8760 hours. CBG: No results for input(s): "GLUCAP" in  the last 168 hours.  Radiological Exams on Admission:  No results found.   Assessment and Plan: * Tense ascites Alcohol liver disease, recurrent tonsillitis.  Prior history of SBP.  Although clinically at this time given no leukocytosis or fever SBP is not suspected I think it is best to cover empirically with ceftriaxone till we have ascitic fluid sampling from IR.  Further I will give the patient Lasix to reduce ascitic accumulation.  Maintain the patient on fluid restriction.  Patient already on IR schedule for tomorrow.  Patient on chronic bactrim ppx.   Hyponatremia This is moderate, asymptomatic, chronic.  Likely due to liver disease.  Will check serum osmolality.  Urine sodium cortisol and TSH.  I will treat the patient with fluid restriction at this time      Advance Care Planning:   Code Status: Full Code patient wishes to be full code.  Consults: IR request place.  Family Communication: per patient.  Severity of Illness: The appropriate patient status for this patient is OBSERVATION. Observation status is judged to be reasonable and necessary in order to provide the required intensity of service to ensure the patient's safety. The patient's presenting symptoms, physical exam findings, and initial radiographic and laboratory data in the context of their medical condition is felt to place them at decreased risk for further clinical deterioration. Furthermore, it is anticipated that the patient will be medically stable for discharge from the hospital within 2 midnights of admission.   Author: Nolberto Hanlon, MD 11/28/2022 7:00 PM  For on call review [email or web address].

## 2022-11-28 NOTE — ED Notes (Signed)
PT is resting in bed and complaining of abdominal pain. Pt has cirrhosis. Pt last drink of alcohol was lastnight. Pt rates pain 10/10

## 2022-11-28 NOTE — ED Provider Notes (Signed)
[place] EMERGENCY DEPARTMENT AT [facility] Provider Note   CSN: [id number] Arrival date & time: 11/28/22  0820     History {Add pertinent medical, surgical, social history, OB history to HPI:1} Chief Complaint  Patient presents with   Abdominal Pain    Mathew [name] is a 42 y.o. male.  HPI      42 year old male with a history of alcohol abuse, alcohol withdrawal, history of decompensated alcoholic cirrhosis with complicated course multiple hospitalizations, recurrent ascites with history of SBP, esophageal varices and concern for hepatic encephalopathy in the past, who presents to concern for abdominal pain  Past Medical History:  Diagnosis Date   Acute metabolic encephalopathy 06/20/2021   Alcohol withdrawal seizure (HCC)    Cirrhosis (HCC)    ETOH abuse    Pneumonia 04/02/2020   SBP (spontaneous bacterial peritonitis) (HCC) 06/17/2021     Home Medications Prior to Admission medications   Medication Sig Start Date End Date Taking? Authorizing Provider  ferrous sulfate 325 (65 FE) MG tablet Take 1 tablet (325 mg total) by mouth daily with breakfast. 08/17/22   [name], MD  folic acid (FOLVITE) 1 MG tablet Take 1 tablet (1 mg total) by mouth daily. 08/17/22   [name], MD  furosemide (LASIX) 20 MG tablet Take 1 tablet (20 mg total) by mouth daily. 11/22/22   Armbruster, Willaim Rayas, MD  lactulose (CHRONULAC) 10 GM/15ML solution Take 45 mLs (30 g total) by mouth 2 (two) times daily. 11/22/22   Armbruster, Willaim Rayas, MD  midodrine (PROAMATINE) 10 MG tablet Take 1 tablet (10 mg total) by mouth 3 (three) times daily with meals. 11/22/22   Armbruster, Willaim Rayas, MD  pantoprazole (PROTONIX) 40 MG tablet Take 1 tablet (40 mg total) by mouth daily at 6 (six) AM. 08/17/22   [name], MD  propranolol (INDERAL) 10 MG tablet Take 1 tablet (10 mg total) by mouth 2 (two) times daily. Patient not taking: Reported on 11/18/2022 08/17/22   [name], MD  sodium bicarbonate 650 MG tablet Take 1 tablet (650 mg total) by mouth 2 (two) times daily. 08/17/22   [name], MD  spironolactone (ALDACTONE) 50 MG tablet Take 1 tablet (50 mg total) by mouth daily. 11/22/22   Armbruster, Willaim Rayas, MD  sulfamethoxazole-trimethoprim (BACTRIM DS) 800-160 MG tablet Take 1 tablet by mouth daily. 11/22/22   Armbruster, Willaim Rayas, MD      Allergies    Patient has no known allergies.    Review of Systems   Review of Systems  Physical Exam Updated Vital Signs BP (!) 165/122 (BP Location: Left Arm)   Pulse (!) 103   Temp 98 F (36.7 C) (Oral)   Resp 18   SpO2 100%  Physical Exam  ED Results / Procedures / Treatments   Labs (all labs ordered are listed, but only abnormal results are displayed) Labs Reviewed - No data to display  EKG None  Radiology No results found.  Procedures Procedures  {Document cardiac monitor, telemetry assessment procedure when appropriate:1}  Medications Ordered in ED Medications - No data to display  ED Course/ Medical Decision Making/ A&P   {   Click here for ABCD2, HEART and other calculatorsREFRESH Note before signing :1}                              Medical Decision Making  ***  {Document critical  care time when appropriate:1} {Document review of labs and clinical decision tools ie heart score, Chads2Vasc2 etc:1}  {Document your independent review of radiology images, and any outside records:1} {Document your discussion with family members, caretakers, and with consultants:1} {Document social determinants of health affecting pt's care:1} {Document your decision making why or why not admission, treatments were needed:1} Final Clinical Impression(s) / ED Diagnoses Final diagnoses:  None    Rx / DC Orders ED Discharge Orders     None

## 2022-11-28 NOTE — ED Provider Notes (Signed)
Pt signed out by Dr. Dalene Seltzer pending IR to drain pt's paracentesis.  Unfortunately, IR was unable to do this today.  They have put him on the schedule for tomorrow am.  Pt has very tense ascites and is in considerable pain.  I think he will need an obs admission to control pain until paracentesis can be done to assure he does not have sbp.  He is afebrile and wbc is not elevated, so I don't think he has it currently, but he does have a hx of this.  Pt d/w Dr. Maryjean Ka (triad) for admission.   Jacalyn Lefevre, MD 11/28/22 [phone]

## 2022-11-28 NOTE — Assessment & Plan Note (Signed)
This is moderate, asymptomatic, chronic.  Likely due to liver disease.  Will check serum osmolality.  Urine sodium cortisol and TSH.  I will treat the patient with fluid restriction at this time

## 2022-11-29 ENCOUNTER — Ambulatory Visit: Payer: Self-pay

## 2022-11-29 ENCOUNTER — Telehealth: Payer: Self-pay

## 2022-11-29 ENCOUNTER — Observation Stay (HOSPITAL_COMMUNITY): Payer: Self-pay

## 2022-11-29 LAB — URINALYSIS, W/ REFLEX TO CULTURE (INFECTION SUSPECTED)
Bacteria, UA: NONE SEEN
Bilirubin Urine: NEGATIVE
Glucose, UA: NEGATIVE mg/dL
Ketones, ur: NEGATIVE mg/dL
Leukocytes,Ua: NEGATIVE
Nitrite: NEGATIVE
Protein, ur: NEGATIVE mg/dL
Specific Gravity, Urine: 1.017 (ref 1.005–1.030)
pH: 5 (ref 5.0–8.0)

## 2022-11-29 LAB — BODY FLUID CELL COUNT WITH DIFFERENTIAL
Eos, Fluid: 0 %
Lymphs, Fluid: 55 %
Monocyte-Macrophage-Serous Fluid: 39 % — ABNORMAL LOW (ref 50–90)
Neutrophil Count, Fluid: 6 % (ref 0–25)
Total Nucleated Cell Count, Fluid: 65 uL (ref 0–1000)

## 2022-11-29 LAB — GLUCOSE, PLEURAL OR PERITONEAL FLUID: Glucose, Fluid: 120 mg/dL

## 2022-11-29 LAB — SODIUM, URINE, RANDOM: Sodium, Ur: <10 mmol/L

## 2022-11-29 LAB — PROTIME-INR
INR: 2.1 — ABNORMAL HIGH (ref 0.8–1.2)
Prothrombin Time: 23.8 s — ABNORMAL HIGH (ref 11.4–15.2)

## 2022-11-29 LAB — PROTEIN, PLEURAL OR PERITONEAL FLUID: Total protein, fluid: <3 g/dL

## 2022-11-29 LAB — TSH: TSH: 3.218 u[IU]/mL (ref 0.350–4.500)

## 2022-11-29 LAB — CORTISOL-AM, BLOOD: Cortisol - AM: 3.5 ug/dL — ABNORMAL LOW (ref 6.7–22.6)

## 2022-11-29 LAB — APTT: aPTT: 38 s — ABNORMAL HIGH (ref 24–36)

## 2022-11-29 MED ORDER — THIAMINE MONONITRATE 100 MG PO TABS: 100.0000 mg | ORAL_TABLET | Freq: Every day | ORAL | Status: DC

## 2022-11-29 MED ORDER — LIDOCAINE HCL 1 % IJ SOLN
INTRAMUSCULAR | Status: AC
Start: 2022-11-29 — End: ?
  Filled 2022-11-29: qty 20

## 2022-11-29 MED ORDER — POTASSIUM CHLORIDE CRYS ER 20 MEQ PO TBCR
40.0000 meq | EXTENDED_RELEASE_TABLET | Freq: Once | ORAL | Status: AC
  Administered 2022-11-29: 40 meq via ORAL
  Filled 2022-11-29: qty 2

## 2022-11-29 NOTE — Discharge Summary (Signed)
Physician Discharge Summary   Patient: Mathew [name] MRN: [id number] DOB: Oct 31, 1980  Admit date:     11/28/2022  Discharge date: 11/29/22  Discharge Physician: Alberteen Sam   PCP: [name] Rigg, NP     Recommendations at discharge:  Follow up with PCP Bertram Denver in 1 week for liver cirrhosis with ascites Bertram Denver:  Please check BMP in 1 week Please refer for IR paracentesis at [facility] in 2-3 weeks for large volume paracentesis     Discharge Diagnoses: Principal Problem:   Ascites Active Problems:   Decompensated hepatic cirrhosis (HCC)   Pancytopenia (HCC)   Hypokalemia   Obesity, Class 1 (BMI 30-34.9)   Hyponatremia   Coagulopathy [facility])     Hospital Course: 42 y.o. 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    IR booked, so observed overnight for paracentesis in the morning.  Patient admitted and observed overnight.  He had paracentesis of 11.5 L in the morning.  Was administered 75g albumin. Fluid protein undetectable, less than 50 nucleated cells.  Afterwards, patient evaluated and stable for discharge home.  NOTE: patient showed that he had filled all his scripts 10 days ago after GI appointment.  He showed me ample supply of furosemide, spironolactone, lactulose etc. And stated that he was taking them.  Due to the #90 of pills, I couldn't count them to verify compliance without spreading them on the hospital bed to count, which I elected not to do.          The [facility] Controlled Substances Registry was reviewed for this patient prior to discharge.   Consultants: None Procedures performed:  Paracentesis   Disposition: Home Diet recommendation:  Discharge Diet Orders (From admission, onward)     Start     Ordered   11/29/22 0000  Diet - low sodium heart healthy         11/29/22 1433             DISCHARGE MEDICATION: Allergies as of 11/29/2022   No Known Allergies      Medication List     TAKE these medications    FeroSul 325 (65 Fe) MG tablet Generic drug: ferrous sulfate Tome 1 tableta (325 mg en total) por va oral diariamente con el desayuno. (Take 1 tablet (325 mg total) by mouth daily with breakfast.)   folic acid 1 MG tablet Commonly known as: FOLVITE Tome 1 tableta (1 mg en total) por va oral diariamente. (Take 1 tablet (1 mg total) by mouth daily.)   furosemide 20 MG tablet Commonly known as: LASIX Tome 1 tableta (20 mg en total) por va oral diariamente. (Take 1 tablet (20 mg total) by mouth daily.)   lactulose 10 GM/15ML solution Commonly known as: CHRONULAC Take 45 mLs (30 g total) by mouth 2 (two) times daily.   midodrine 10 MG tablet Commonly known as: PROAMATINE Take 1 tablet (10 mg total) by mouth 3 (three) times daily with meals.   spironolactone 50 MG tablet Commonly known as: ALDACTONE Tome 1 tableta (50 mg en total) por va oral diariamente. (Take 1 tablet (50 mg total) by mouth daily.)   sulfamethoxazole-trimethoprim 800-160 MG tablet Commonly known as: BACTRIM DS Tome 1 tableta por va oral diariamente. (Take 1 tablet by mouth daily.)        Follow-up Information  [name] Rigg, NP. Schedule an appointment as soon as possible for a visit in 1 week(s).   Specialty: Nurse Practitioner Contact information: [address] [place] [place] [address] [phone]                 Discharge Instructions     Diet - low sodium heart healthy   Complete by: As directed    Discharge instructions   Complete by: As directed    Furosemide es un diurtico para eliminar lquidos. Spironolactone tambin es un diurtico.  Lactulose te have defecar Ayuda a eliminar las toxinas que causan confusin.  Bactrim (trimethoprim-sulfamethoxazole) previene infecciones en el  abdomen.  Midodrine ayuda a elevar la presin arterial.  Esto es importante para usted porque la insuficiencia heptica provoca presin arterial baja.   Increase activity slowly   Complete by: As directed        Discharge Exam: There were no vitals filed for this visit.  General: Pt is alert, awake, not in acute distress Cardiovascular: RRR, nl S1-S2, no murmurs appreciated.   No LE edema.   Respiratory: Normal respiratory rate and rhythm.  CTAB without rales or wheezes. Abdominal: Abdomen soft and non-tender.  No distension or HSM.   Neuro/Psych: Strength symmetric in upper and lower extremities.  Judgment and insight appear normal.   Condition at discharge: fair  The results of significant diagnostics from this hospitalization (including imaging, microbiology, ancillary and laboratory) are listed below for reference.   Imaging Studies: US Paracentesis  Result Date: 11/29/2022 INDICATION: Patient with history of alcoholic cirrhosis, prior spontaneous bacterial peritonitis, recurrent ascites. Request received for diagnostic and therapeutic paracentesis. EXAM: ULTRASOUND GUIDED DIAGNOSTIC AND THERAPEUTIC PARACENTESIS MEDICATIONS: 8 ml 1% lidocaine COMPLICATIONS: None immediate. PROCEDURE: Informed written consent was obtained from the patient via interpreter after a discussion of the risks, benefits and alternatives to treatment. A timeout was performed prior to the initiation of the procedure. Initial ultrasound scanning demonstrates a large amount of ascites within the right lower abdominal quadrant. The right lower abdomen was prepped and draped in the usual sterile fashion. 1% lidocaine was used for local anesthesia. Following this, a 19 gauge, 7-cm, Yueh catheter was introduced. An ultrasound image was saved for documentation purposes. The paracentesis was performed. The catheter was removed and a dressing was applied. The patient tolerated the procedure well without immediate post  procedural complication. Patient received post-procedure intravenous albumin; see nursing notes for details. FINDINGS: A total of approximately 11.5 liters of yellow fluid was removed. Samples were sent to the laboratory as requested by the clinical team. IMPRESSION: Successful ultrasound-guided diagnostic and therapeutic paracentesis yielding 11.5 liters of peritoneal fluid. PLAN: The patient has previously been formally evaluated by the [facility] Interventional Radiology Portal Hypertension [facility] and is being actively followed for potential future intervention. Performed by: Jannette Spanner Electronically Signed   By: Marliss Coots M.D.   On: 11/29/2022 10:59   US Paracentesis  Result Date: 11/15/2022 INDICATION: Alcoholic cirrhosis with recurrent ascites. Request received for therapeutic and diagnostic paracentesis. EXAM: ULTRASOUND GUIDED  PARACENTESIS MEDICATIONS: 8 cc 1% lidocaine COMPLICATIONS: None immediate. PROCEDURE: Informed written consent was obtained from the patient after a discussion of the risks, benefits and alternatives to treatment. A timeout was performed prior to the initiation of the procedure. Initial ultrasound scanning demonstrates a large amount of ascites within the left lower abdominal quadrant. The left lower abdomen was prepped and draped in the usual sterile fashion. 1% lidocaine was used for local  anesthesia. Following this, a 19 gauge, 7-cm, Yueh catheter was introduced. An ultrasound image was saved for documentation purposes. The paracentesis was performed. The catheter was removed and a dressing was applied. The patient tolerated the procedure well without immediate post procedural complication. Patient received post-procedure intravenous albumin; see nursing notes for details. FINDINGS: A total of approximately 10 L of clear yellow fluid was removed. Samples were sent to the laboratory as requested by the clinical team. IMPRESSION: Successful  ultrasound-guided paracentesis yielding 10 liters of peritoneal fluid. PLAN: The patient has previously been formally evaluated by the [facility] Interventional Radiology Portal Hypertension Clinic and is being actively followed for potential future intervention. Procedure performed by Mina Marble, PA-C Electronically Signed   By: Judie Petit.  Shick M.D.   On: 11/15/2022 16:00   IR Paracentesis  Result Date: 11/07/2022 INDICATION: 42 year old male. History of alcoholic cirrhosis with recurrent ascites. Request is for therapeutic and diagnostic paracentesis. Maximum of 4 L EXAM: ULTRASOUND GUIDED THERAPEUTIC AND DIAGNOSTIC PARACENTESIS MEDICATIONS: Lidocaine 1% 10 mL COMPLICATIONS: None immediate. PROCEDURE: Informed written consent was obtained from the patient after a discussion of the risks, benefits and alternatives to treatment. A timeout was performed prior to the initiation of the procedure. Initial ultrasound scanning demonstrates a large amount of ascites within the right lower abdominal quadrant. The right lower abdomen was prepped and draped in the usual sterile fashion. 1% lidocaine was used for local anesthesia. Following this, a 19 gauge, 7-cm, Yueh catheter was introduced. An ultrasound image was saved for documentation purposes. The paracentesis was performed. The catheter was removed and a dressing was applied. The patient tolerated the procedure well without immediate post procedural complication. FINDINGS: A total of approximately 4 L of straw-colored fluid was removed. Samples were sent to the laboratory as requested by the clinical team. IMPRESSION: Successful ultrasound-guided therapeutic and diagnostic paracentesis yielding 4.4 liters of peritoneal fluid. Performed by Anders Grant NP PLAN: The patient has previously been formally evaluated by the [facility] Interventional Radiology Portal Hypertension Clinic and is being actively followed for potential future intervention.  Electronically Signed   By: Marliss Coots M.D.   On: 11/07/2022 16:00    Microbiology: Results for orders placed or performed during the hospital encounter of 11/15/22  Culture, body fluid w Gram Stain-bottle     Status: None   Collection Time: 11/15/22  2:39 PM   Specimen: Fluid  Result Value Ref Range Status   Specimen Description FLUID PERITONEAL  Final   Special Requests BOTTLES DRAWN AEROBIC AND ANAEROBIC  Final   Culture   Final    NO GROWTH 5 DAYS Performed at [facility] Lab, [address] [place], [place] [address]    Report Status 11/20/2022 FINAL  Final  Gram stain     Status: None   Collection Time: 11/15/22  2:39 PM   Specimen: Fluid  Result Value Ref Range Status   Specimen Description FLUID PERITONEAL  Final   Special Requests NONE  Final   Gram Stain   Final    WBC PRESENT, PREDOMINANTLY MONONUCLEAR NO ORGANISMS SEEN CYTOSPIN SMEAR Performed at [facility], [address] [place], [place] [address]    Report Status 11/15/2022 FINAL  Final    Labs: CBC: Recent Labs  Lab 11/28/22 0857  WBC 2.2*  NEUTROABS 1.3*  HGB 9.7*  HCT 27.1*  MCV 103.0*  PLT 54*   Basic Metabolic Panel: Recent Labs  Lab 11/28/22 0857 11/28/22 1954  NA 128*  --  K 3.3*  --   CL 101  --   CO2 19*  --   GLUCOSE 137*  --   BUN 9  --   CREATININE 0.69  --   CALCIUM 8.3*  --   MG  --  2.0   Liver Function Tests: Recent Labs  Lab 11/28/22 0857  AST 38  ALT 17  ALKPHOS 120  BILITOT 4.1*  PROT 6.5  ALBUMIN 2.9*   CBG: No results for input(s): "GLUCAP" in the last 168 hours.  Discharge time spent: approximately 35 minutes spent on discharge counseling, evaluation of patient on day of discharge, and coordination of discharge planning with nursing, social work, pharmacy and case management  Signed: Alberteen Sam, MD [facility] 11/29/2022

## 2022-11-29 NOTE — Telephone Encounter (Signed)
Using the language line, called and spoke to patient but connection was terrible so called and spoke to patient's brother who said he would make sure he got the message

## 2022-11-29 NOTE — Procedures (Addendum)
Ultrasound-guided diagnostic and therapeutic paracentesis performed by Buzzy Han ,PA-C yielding 11.5 liters of yellow  fluid. No immediate complications. A portion of the fluid was sent to the lab for preordered studies. EBL < 2 cc. TRH team notified.

## 2022-11-29 NOTE — TOC Initial Note (Signed)
Transition of Care [facility]) - Initial/Assessment Note    Patient Details  Name: Mathew [name] MRN: [id number] Date of Birth: Oct 20, 1980  Transition of [facility]) CM/SW Contact:    Mathew Prows, RN Phone Number: 11/29/2022, 3:22 PM  Clinical Narrative:                 Mathew [name] for SDOH risks; assisted by Mathew [name] # [phone]; spoke w/ pt in room; pt says he is from home w/ a friend; he plans to return at d/c; he identified POC Mathew [name] (brother) [phone]; pt says he has transportation; he denies IPV; pt says he is not able to work and he has problems paying food, housing, and utilities; pt says he does not have DME, HH services, or home oxygen; pt has received medication assistance; he agrees to receive resources for financial assistance and social services;  see note dated 11/22/22 from pt's case manager at [facility]; pt informed integrated services also available at [employer] office; pt will call to see if additional appts needed to discuss services; he has appt w/ new PCP and medicaid application is pending; no TOC needs.  Expected Discharge Plan: Home/Self Care Barriers to Discharge: No Barriers Identified   Patient Goals and CMS Choice Patient states their goals for this hospitalization and ongoing recovery are:: home CMS Medicare.gov Compare Post Acute Care list provided to:: Patient        Expected Discharge Plan and Services   Discharge Planning Services: CM Consult Post Acute Care Choice: NA Living arrangements for the past 2 months: Single Family Home Expected Discharge Date: 11/29/22               DME Arranged: N/A DME Agency: NA       HH Arranged: NA HH Agency: NA        Prior Living Arrangements/Services Living arrangements for the past 2 months: Single Family Home Lives with:: Friends Patient language and need for interpreter reviewed:: Yes Do you feel safe going back to the place where you live?: Yes       Need for Family Participation in Patient Care: Yes (Comment) Care giver support system in place?: Yes (comment) Current home services:  (n/a) Criminal Activity/Legal Involvement Pertinent to Current Situation/Hospitalization: No - Comment as needed  Activities of Daily Living   ADL Screening (condition at time of admission) Independently performs ADLs?: No Does the patient have a NEW difficulty with bathing/dressing/toileting/self-feeding that is expected to last >3 days?: Yes (Initiates electronic notice to provider for possible OT consult) Does the patient have a NEW difficulty with getting in/out of bed, walking, or climbing stairs that is expected to last >3 days?: Yes (Initiates electronic notice to provider for possible PT consult) Does the patient have a NEW difficulty with communication that is expected to last >3 days?: No Is the patient deaf or have difficulty hearing?: No Does the patient have difficulty seeing, even when wearing glasses/contacts?: No Does the patient have difficulty concentrating, remembering, or making decisions?: No  Permission Sought/Granted Permission sought to share information with : Case Manager Permission granted to share information with : Yes, Verbal Permission Granted  Share Information with NAME: Case Manager     Permission granted to share info w Relationship: Mathew [name] (brother) [phone]     Emotional Assessment Appearance:: Appears stated age Attitude/Demeanor/Rapport: Gracious Affect (typically observed): Accepting Orientation: : Oriented to Self, Oriented to Place, Oriented to  Time, Oriented to Situation Alcohol / Substance Use:  Alcohol Use Psych Involvement: No (comment)  Admission diagnosis:  Pancytopenia (HCC) [D61.818] Umbilical hernia without obstruction and without gangrene [K42.9] Ascites due to alcoholic cirrhosis (HCC) [K70.31] Tense ascites [R18.8] Patient Active Problem List   Diagnosis Date Noted   Tense ascites  11/28/2022   H/O spontaneous bacterial peritonitis 09/25/2022   Coagulopathy (HCC) 09/25/2022   Metabolic acidosis 09/25/2022   Spontaneous bacterial peritonitis (HCC) 05/31/2022   Alcoholic hepatitis 05/31/2022   GERD (gastroesophageal reflux disease) 05/31/2022   Obesity (BMI 30-39.9) 05/31/2022   Decompensated hepatic cirrhosis (HCC) 03/01/2022   Macrocytic anemia 02/19/2022   Uninsured 11/16/2021   Thrombocytopenia (HCC) 11/15/2021   Alcohol use disorder 09/02/2021   Hyponatremia 09/02/2021   Cirrhosis of liver (HCC) 09/01/2021   Pancytopenia (HCC)    Tobacco abuse    SBP (spontaneous bacterial peritonitis) (HCC) s/t Decompensated Hepatic Cirrhosis 06/17/2021   Umbilical hernia 11/26/2020   Vitamin D deficiency 02/03/2020   Edema due to hypoalbuminemia 12/20/2019   Alcoholic cirrhosis of liver with ascites (HCC) 03/13/2018   Hypokalemia 07/09/2016   PCP:  Mathew Rigg, NP Pharmacy:   [facility] [phone] - [place], [place] - [address] AT [facility] [address] [facility] [address] [place] [address] Phone: [phone] Fax: [phone]  [facility] [address] [place] [address] Phone: [phone] Fax: [phone]  [facility] - [place], [place] - [address] [place] [address] Phone: [phone] Fax: [phone]  [name] [address] [place] [address] Phone: [phone] Fax: [phone]  [facility] [address] [place] [address] Phone: [phone] Fax: [phone]     Social Determinants of Health (SDOH) Social History: SDOH Screenings   Food Insecurity: Food Insecurity Present (11/29/2022)  Housing: Medium Risk (11/29/2022)  Transportation Needs: No Transportation Needs (11/29/2022)  Recent Concern: Transportation  Needs - Unmet Transportation Needs (10/26/2022)  Utilities: At Risk (11/29/2022)  Depression (PHQ2-9): Medium Risk (10/26/2022)  Financial Resource Strain: Medium Risk (10/26/2022)  Physical Activity: Inactive (10/26/2022)  Social Connections: Unknown (10/26/2022)  Stress: No Stress Concern Present (10/26/2022)  Tobacco Use: High Risk (11/28/2022)  Health Literacy: Adequate Health Literacy (10/26/2022)   SDOH Interventions: Food Insecurity Interventions: Inpatient TOC, Other (Comment) (resources given) Housing Interventions: Inpatient TOC (resources given) Transportation Interventions: Intervention Not Indicated, Inpatient TOC Utilities Interventions: Inpatient TOC (resources given)   Readmission Risk Interventions    09/26/2022    2:53 PM 06/06/2022   11:59 AM  Readmission Risk Prevention Plan  Transportation Screening Complete Complete  PCP or Specialist Appt within 3-5 Days  Complete  HRI or Home Care Consult  Complete  Social Work Consult for Recovery Care Planning/Counseling  Complete  Palliative Care Screening  Complete  Medication Review [employer]) Referral to Pharmacy Complete  PCP or Specialist appointment within 3-5 days of discharge Complete   HRI or Home Care Consult Complete   SW Recovery Care/Counseling Consult Complete   Palliative Care Screening Patient Refused   Skilled Nursing Facility Not Applicable

## 2022-11-29 NOTE — Hospital Course (Signed)
42 y.o. 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    IR booked, so observed overnight for paracentesis in the morning.

## 2022-11-29 NOTE — Plan of Care (Signed)
  Problem: Education: Goal: Knowledge of General Education information will improve Description: Including pain rating scale, medication(s)/side effects and non-pharmacologic comfort measures Outcome: Progressing   Problem: Clinical Measurements: Goal: Ability to maintain clinical measurements within normal limits will improve Outcome: Progressing Goal: Diagnostic test results will improve Outcome: Progressing   Problem: Activity: Goal: Risk for activity intolerance will decrease Outcome: Progressing   Problem: Nutrition: Goal: Adequate nutrition will be maintained Outcome: Progressing   Problem: Coping: Goal: Level of anxiety will decrease Outcome: Progressing

## 2022-11-29 NOTE — Progress Notes (Deleted)
TOC Brief Assessment completed; see note dated 11/22/22 from pt's case manager; he has appt w/ new PCP and medicaid application is pending.

## 2022-11-29 NOTE — Telephone Encounter (Signed)
-----   Message from [facility] sent at 11/24/2022 10:45 AM EST ----- Regarding: due for labs this week Remind spanish speaking patient to go to lab at the end of this week

## 2022-11-29 NOTE — Assessment & Plan Note (Signed)
Class 1

## 2022-11-30 ENCOUNTER — Telehealth: Payer: Self-pay

## 2022-11-30 LAB — PATHOLOGIST SMEAR REVIEW

## 2022-11-30 NOTE — Transitions of Care (Post Inpatient/ED Visit) (Signed)
   11/30/2022  Name: Mathew [name] MRN: [id number] DOB: 1980/12/22  Today's TOC FU Call Status: Today's TOC FU Call Status:: Successful TOC FU Call Completed TOC FU Call Complete Date: 11/30/22 Patient's Name and Date of Birth confirmed.  Transition Care Management Follow-up Telephone Call Date of Discharge: 11/29/22 Discharge Facility: [facility]) Type of Discharge: Inpatient Admission Primary Inpatient Discharge Diagnosis:: ascites How have you been since you were released from the hospital?: Better (I emphasized multiple times throughout our conversation of the importance of abstaining from alcohol and he said he understood) Any questions or concerns?: Yes Patient Questions/Concerns:: He said his brother is taking him to an appointment tomorrow but he is not sure where it is or why he is going.  I told him that I would check with GI because it may be for labs. Patient Questions/Concerns Addressed: Other: (Secure chat message sent to Berlinda Last, CMA /GI inquiring if patient is scheduled to have labs done tomorrow.)  Items Reviewed: Did you receive and understand the discharge instructions provided?: Yes Medications obtained,verified, and reconciled?: Yes (Medications Reviewed) (He said he stil has all of his medications that he received last week at [facility] at [facility]. and he has been taking them as instructed. No new med ordered. He did not have any questions about the med regime.) Any new allergies since your discharge?: No Dietary orders reviewed?: No (He said he needs some food and was agreeable to placing a referral to [facility]. The referral was then placed as discussed.) Do you have support at home?: Yes People in Home: friend(s) Name of Support/Comfort Primary Source: he said he has 2 roommates.  His brother can also assist as needed.  His brother provides rides to appts.  Medications Reviewed Today: Medications Reviewed Today    Medications were not reviewed in this encounter     Home Care and Equipment/Supplies: Were Home Health Services Ordered?: No Any new equipment or medical supplies ordered?: No  Functional Questionnaire: Do you need assistance with bathing/showering or dressing?: No Do you need assistance with meal preparation?: No Do you need assistance with eating?: No Do you have difficulty maintaining continence: No Do you need assistance with getting out of bed/getting out of a chair/moving?: No Do you have difficulty managing or taking your medications?: No (his difficulty has been affording his meds.)  Follow up appointments reviewed: PCP Follow-up appointment confirmed?: Yes Date of PCP follow-up appointment?: 12/05/22 Follow-up Provider: Dr Alvis Lemmings. She is not his PCP.  He needs to establish care with a new PCP.  Bertram Denver, NP is listed as PCP but he has not established care with her [facility] Follow-up appointment confirmed?: NA Do you need transportation to your follow-up appointment?: No Do you understand care options if your condition(s) worsen?: Yes-patient verbalized understanding    SIGNATURE.Robyne Peers, RN

## 2022-12-01 ENCOUNTER — Other Ambulatory Visit: Payer: Self-pay

## 2022-12-01 ENCOUNTER — Other Ambulatory Visit (INDEPENDENT_AMBULATORY_CARE_PROVIDER_SITE_OTHER): Payer: Self-pay

## 2022-12-01 DIAGNOSIS — K7031 Alcoholic cirrhosis of liver with ascites: Secondary | ICD-10-CM

## 2022-12-01 LAB — BASIC METABOLIC PANEL
BUN: 11 mg/dL (ref 6–23)
CO2: 21 meq/L (ref 19–32)
Calcium: 8 mg/dL — ABNORMAL LOW (ref 8.4–10.5)
Chloride: 95 meq/L — ABNORMAL LOW (ref 96–112)
Creatinine, Ser: 0.84 mg/dL (ref 0.40–1.50)
GFR: 106.05 mL/min (ref >60.00)
Glucose, Bld: 133 mg/dL — ABNORMAL HIGH (ref 70–99)
Potassium: 3.8 meq/L (ref 3.5–5.1)
Sodium: 122 meq/L — ABNORMAL LOW (ref 135–145)

## 2022-12-02 ENCOUNTER — Telehealth: Payer: Self-pay

## 2022-12-02 LAB — BODY FLUID CULTURE W GRAM STAIN: Culture: NO GROWTH

## 2022-12-02 NOTE — Telephone Encounter (Signed)
I tried to reach the patient to inquire about his diet and medication compliance as well as again stress the importance of abstaining from alcohol.  I also wanted to offer him a referral to our clinical SW for ongoing support, resources to help address his alcohol use.  Call placed with assistance of Spanish Interpreter: [id number]/[employer] , had to leave a message requesting a call back.

## 2022-12-05 ENCOUNTER — Ambulatory Visit: Payer: Self-pay | Attending: Family Medicine | Admitting: Family Medicine

## 2022-12-05 ENCOUNTER — Encounter: Payer: Self-pay | Admitting: Family Medicine

## 2022-12-05 ENCOUNTER — Other Ambulatory Visit: Payer: Self-pay

## 2022-12-05 ENCOUNTER — Telehealth: Payer: Self-pay

## 2022-12-05 VITALS — BP 137/76 | HR 104 | Ht 65.0 in | Wt 186.6 lb

## 2022-12-05 DIAGNOSIS — F1090 Alcohol use, unspecified, uncomplicated: Secondary | ICD-10-CM

## 2022-12-05 DIAGNOSIS — K7031 Alcoholic cirrhosis of liver with ascites: Secondary | ICD-10-CM

## 2022-12-05 DIAGNOSIS — K429 Umbilical hernia without obstruction or gangrene: Secondary | ICD-10-CM

## 2022-12-05 DIAGNOSIS — K7290 Hepatic failure, unspecified without coma: Secondary | ICD-10-CM

## 2022-12-05 DIAGNOSIS — K7460 Unspecified cirrhosis of liver: Secondary | ICD-10-CM

## 2022-12-05 MED ORDER — FUROSEMIDE 20 MG PO TABS
20.0000 mg | ORAL_TABLET | Freq: Every day | ORAL | 0 refills | Status: DC
  Filled 2022-12-05: qty 90, 90d supply, fill #0

## 2022-12-05 MED ORDER — LACTULOSE 10 GM/15ML PO SOLN
30.0000 g | Freq: Two times a day (BID) | ORAL | 1 refills | Status: DC
  Filled 2022-12-05: qty 473, 5d supply, fill #0

## 2022-12-05 MED ORDER — SPIRONOLACTONE 50 MG PO TABS
50.0000 mg | ORAL_TABLET | Freq: Every day | ORAL | 0 refills | Status: DC
  Filled 2022-12-05: qty 90, 90d supply, fill #0

## 2022-12-05 MED ORDER — ALBUMIN HUMAN 25 % IV SOLN
25.0000 g | Freq: Once | INTRAVENOUS | 0 refills | Status: AC
  Filled 2022-12-05: qty 50, fill #0

## 2022-12-05 MED ORDER — MIDODRINE HCL 10 MG PO TABS
10.0000 mg | ORAL_TABLET | Freq: Three times a day (TID) | ORAL | 0 refills | Status: DC
  Filled 2022-12-05: qty 270, 90d supply, fill #0

## 2022-12-05 NOTE — Telephone Encounter (Signed)
I met with the patient when he was in the clinic today for an appointment with Dr Alvis Lemmings.  With the assistance of [employer] Spanish interpreter [phone], I spoke to the patient about the importance of complete abstinence from alcohol and the dangerous outcomes with continued use.  He said he understood but he has still been drinking.  Regarding the low sodium diet,  he said he has the papers from GI and is aware of the instructions but he did not confirm if he is trying to adhere to it.   Dr Alvis Lemmings requested a follow up appointment for him with GI and I was able to schedule him with [name] for  tomorrow,12/06/2022 @ 1430. I  provided the patient with written appointment reminder and [name], reviewed the appointment instructions in Spanish with him. He said he understood and will need to get his brother's car for the appointment.  We stressed the importance of keeping this appointment and again he said he understood. He said he wants to get the " liquid off" of his belly.

## 2022-12-05 NOTE — Patient Instructions (Signed)
Ascitis Ascites  La ascitis es la acumulacin de demasiado lquido en el abdomen. La ascitis puede variar de leve a grave. Si no se trata, la ascitis puede agravarse. Cules son las causas? Esta afeccin puede ser causada por lo siguiente: Una enfermedad del hgado llamada cirrosis. Esta es la causa ms frecuente de ascitis. Hepatitis alcohlica o prolongada (crnica). Infeccin o inflamacin en el abdomen. Cncer en el abdomen. Insuficiencia cardaca. Enfermedad renal. Inflamacin del pncreas. Cogulos en las venas del hgado. Cules son los signos o sntomas? Los sntomas de esta afeccin incluyen: Sensacin de que el abdomen est lleno. Esto es frecuente. Aumento del tamao del abdomen o la cintura. Hinchazn en las piernas. Hinchazn del escroto. Dificultad para respirar. Dolor en el abdomen. Aumento repentino de [place]. Si la afeccin es leve, es posible que no tenga sntomas. Cmo se diagnostica? Esta afeccin se diagnostica en funcin de sus antecedentes mdicos y de un examen fsico. El mdico puede solicitarle estudios de diagnstico por imgenes, como una ecografa o una exploracin por tomografa computarizada (TC) del abdomen. Cmo se trata? El tratamiento de esta afeccin depende de la causa de la ascitis. Puede incluir lo siguiente: Tomar un medicamento que lo Adult nurse. Se denomina diurtico. Reducir estrictamente el consumo de sal (sodio). La sal puede causar la acumulacin (retencin) de lquido adicional en el cuerpo, lo que empeora la ascitis. Someterse a un procedimiento para extraer el lquido del abdomen (paracentesis). Someterse a un procedimiento que [employer] de las principales venas dentro del hgado y [place] la presin sobre este rgano. Este procedimiento se llama derivacin portosistmica intraheptica transyugular (DPIT). Colocacin de un catter de drenaje (derivacin peritoneovenosa) para manejar el lquido adicional en el abdomen. La ascitis  puede desaparecer o mejorar cuando se trata la afeccin que la caus. Siga estas instrucciones en su casa: Comida y bebida Lleve un registro de su peso. Para hacerlo, psese a la [employer] y anote [address]. Intente no comer comidas saladas (con alto contenido de sodio). Siga las instrucciones del mdico respecto de la cantidad de lquido que puede beber. Lleve un registro de la cantidad de lquido que bebe y de los cambios en la cantidad que orina o la frecuencia con la que [place]. Instrucciones generales Informe al mdico si hay cambios en su salud, especialmente si tiene sntomas nuevos o los sntomas empeoran. Use los medicamentos de venta libre y los recetados solamente como se lo haya indicado el mdico. Cumpla con todas las visitas de seguimiento. Esto es importante. Comunquese con un mdico si: Aumenta ms de 3 lb (1,36 kg) en 3 das. Aumenta el tamao de su cintura. Tiene un nuevo episodio de hinchazn en las piernas. La hinchazn de las piernas empeora. Solicite ayuda de inmediato si: Tiene fiebre o escalofros. Se siente confundido. Tiene dificultad para respirar que es nueva o ms intensa. Tiene un dolor nuevo o que empeora en el abdomen. Tiene un episodio de hinchazn del escroto nuevo o que empeora. Resumen La ascitis es la acumulacin de demasiado lquido en el abdomen. La ascitis la pueden provocar diversas afecciones, tales como cirrosis, hepatitis, cncer o insuficiencia cardaca congestiva. Entre los sntomas, puede incluirse hinchazn del abdomen y de otras zonas del cuerpo debido lquido adicional. El tratamiento puede incluir cambios en la alimentacin, medicamentos o procedimientos. Esta informacin no tiene Theme park manager el consejo del mdico. Asegrese de hacerle al mdico cualquier pregunta que tenga. Document Revised: 11/04/2019 Document Reviewed: 11/04/2019 Elsevier Patient Education  2024  [employer].

## 2022-12-05 NOTE — Progress Notes (Signed)
Subjective:  Patient ID: Mathew [name], male    DOB: February 18, 1980  Age: 42 y.o. MRN: [id number]  CC: Hospitalization Follow-up   HPI Mathew [name] is a 42 y.o. 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 He is seen today for transition of care follow-up.  Interval History: Discussed the use of AI scribe software for clinical note transcription with the patient, who gave verbal consent to proceed.  He was hospitalized from 11/18 through 11/19 for recurrent ascites and pancytopenia and underwent abdominal paracentesis with removal of 11.5 L of fluid and administration of 25 g albumin.  Prior to that he has had recurrent ED visits for same. Despite this, he reports that the distension has reaccumulated. He is unsure why this is happening so frequently. He also mentions a hernia in his belly button, which is causing him discomfort.  He admits to continuing to drink alcohol, albeit in smaller quantities than before. He describes feeling anxious and desperate, and uses alcohol to cope with these feelings.  He states has been seeing various doctors and is unsure who his gastroenterologist is. He has also been inconsistent with his medication regimen due to forgetting his medications in his brother's car.  Reviewed notes in epic and his last visit with GI was with Dr. Adela Lank 2 weeks ago.       Past Medical History:  Diagnosis Date   Acute metabolic encephalopathy 06/20/2021   Alcohol withdrawal seizure (HCC)    Cirrhosis (HCC)    ETOH abuse    Pneumonia 04/02/2020   SBP (spontaneous bacterial peritonitis) (HCC) 06/17/2021    Past Surgical History:  Procedure Laterality Date   COLONOSCOPY WITH PROPOFOL N/A 03/17/2018   Procedure: COLONOSCOPY WITH PROPOFOL;  Surgeon: Bernette Redbird, MD;  Location: [facility] ENDOSCOPY;  Service:  Endoscopy;  Laterality: N/A;   ESOPHAGOGASTRODUODENOSCOPY (EGD) WITH PROPOFOL N/A 03/17/2018   Procedure: ESOPHAGOGASTRODUODENOSCOPY (EGD) WITH PROPOFOL;  Surgeon: Bernette Redbird, MD;  Location[facility] ENDOSCOPY;  Service: Endoscopy;  Laterality: N/A;   IR PARACENTESIS  03/14/2018   IR PARACENTESIS  11/18/2020   IR PARACENTESIS  06/17/2021   IR PARACENTESIS  08/30/2021   IR PARACENTESIS  09/02/2021   IR PARACENTESIS  11/16/2021   IR PARACENTESIS  03/01/2022   IR PARACENTESIS  05/31/2022   IR PARACENTESIS  06/02/2022   IR PARACENTESIS  06/23/2022   IR PARACENTESIS  07/01/2022   IR PARACENTESIS  08/18/2022   IR PARACENTESIS  09/26/2022   IR PARACENTESIS  11/07/2022    Family History  Adopted: Yes  Problem Relation Age of Onset   Liver disease Neg Hx    Esophageal cancer Neg Hx    Colon cancer Neg Hx     Social History   Socioeconomic History   Marital status: Single    Spouse name: Not on file   Number of children: 1   Years of education: Not on file   Highest education level: Not on file  Occupational History   Occupation: unemployed  Tobacco Use   Smoking status: Every Day    Current packs/day: 0.25    Average packs/day: 0.3 packs/day for 3.3 years (0.8 ttl pk-yrs)    Types: Cigarettes    Start date: 08/11/2019   Smokeless tobacco: Never  Vaping Use   Vaping status: Never Used  Substance and Sexual Activity   Alcohol use: Yes    Alcohol/week: 2.0 standard drinks  of alcohol    Types: 2 Cans of beer per week    Comment: 3-6 beer cans a week   Drug use: No   Sexual activity: Not Currently  Other Topics Concern   Not on file  Social History Narrative   Not on file   Social Determinants of Health   Financial Resource Strain: Medium Risk (10/26/2022)   Overall Financial Resource Strain (CARDIA)    Difficulty of Paying Living Expenses: Somewhat hard  Food Insecurity: Food Insecurity Present (11/29/2022)   Hunger Vital Sign    Worried About Running Out of Food in the Last Year:  Sometimes true    Ran Out of Food in the Last Year: Never true  Transportation Needs: No Transportation Needs (11/29/2022)   PRAPARE - Administrator, Civil Service (Medical): No    Lack of Transportation (Non-Medical): No  Recent Concern: Transportation Needs - Unmet Transportation Needs (10/26/2022)   PRAPARE - Transportation    Lack of Transportation (Medical): Yes    Lack of Transportation (Non-Medical): Yes  Physical Activity: Inactive (10/26/2022)   Exercise Vital Sign    Days of Exercise per Week: 0 days    Minutes of Exercise per Session: 0 min  Stress: No Stress Concern Present (10/26/2022)   [employer] of Occupational Health - Occupational Stress Questionnaire    Feeling of Stress : Not at all  Social Connections: Unknown (10/26/2022)   Social Connection and Isolation Panel [NHANES]    Frequency of Communication with Friends and Family: Once a week    Frequency of Social Gatherings with Friends and Family: Never    Attends Religious Services: Never    Database administrator or Organizations: Yes    Attends Banker Meetings: Never    Marital Status: Patient declined    No Known Allergies  Outpatient Medications Prior to Visit  Medication Sig Dispense Refill   ferrous sulfate 325 (65 FE) MG tablet Take 1 tablet (325 mg total) by mouth daily with breakfast. 60 tablet 3   folic acid (FOLVITE) 1 MG tablet Take 1 tablet (1 mg total) by mouth daily. 60 tablet 1   sulfamethoxazole-trimethoprim (BACTRIM DS) 800-160 MG tablet Take 1 tablet by mouth daily. 90 tablet 0   furosemide (LASIX) 20 MG tablet Take 1 tablet (20 mg total) by mouth daily. 90 tablet 0   lactulose (CHRONULAC) 10 GM/15ML solution Take 45 mLs (30 g total) by mouth 2 (two) times daily. 473 mL 1   midodrine (PROAMATINE) 10 MG tablet Take 1 tablet (10 mg total) by mouth 3 (three) times daily with meals. 270 tablet 0   spironolactone (ALDACTONE) 50 MG tablet Take 1 tablet (50 mg total)  by mouth daily. 90 tablet 0   No facility-administered medications prior to visit.     ROS Review of Systems  Constitutional:  Negative for activity change and appetite change.  HENT:  Negative for sinus pressure and sore throat.   Respiratory:  Negative for chest tightness, shortness of breath and wheezing.   Cardiovascular:  Negative for chest pain and palpitations.  Gastrointestinal:  Positive for abdominal distention and abdominal pain. Negative for constipation.  Genitourinary: Negative.   Musculoskeletal: Negative.   Psychiatric/Behavioral:  Negative for behavioral problems and dysphoric mood.     Objective:  BP 137/76   Pulse (!) 104   Ht 5\' 5"  (1.651 m)   Wt 186 lb 9.6 oz (84.6 kg)   SpO2 100%   BMI 31.05 kg/m  12/05/2022    3:33 PM 11/29/2022    1:43 PM 11/29/2022   10:58 AM  BP/Weight  Systolic BP 137 117 103  Diastolic BP 76 68 57  Wt. (Lbs) 186.6    BMI 31.05 kg/m2        Physical Exam Constitutional:      Appearance: He is well-developed.  Cardiovascular:     Rate and Rhythm: Tachycardia present.     Heart sounds: Normal heart sounds. No murmur heard. Pulmonary:     Effort: Pulmonary effort is normal.     Breath sounds: Normal breath sounds. No wheezing or rales.  Chest:     Chest wall: No tenderness.  Abdominal:     General: Bowel sounds are normal. There is distension (ascites with spider nevi).     Palpations: Abdomen is soft. There is no mass.     Tenderness: There is abdominal tenderness.     Hernia: A hernia (umbilical) is present.  Musculoskeletal:        General: Normal range of motion.     Right lower leg: Edema present.     Left lower leg: Edema present.  Neurological:     Mental Status: He is alert and oriented to person, place, and time.  Psychiatric:        Mood and Affect: Mood normal.        Latest Ref Rng & Units 12/01/2022    9:43 AM 11/28/2022    8:57 AM 11/15/2022    1:09 PM  CMP  Glucose 70 - 99 mg/dL 914   782  956   BUN 6 - 23 mg/dL 11  9  <5   Creatinine 0.40 - 1.50 mg/dL 2.13  0.86  5.78   Sodium 135 - 145 mEq/L 122  128  131   Potassium 3.5 - 5.1 mEq/L 3.8  3.3  3.7   Chloride 96 - 112 mEq/L 95  101  105   CO2 19 - 32 mEq/L 21  19  18    Calcium 8.4 - 10.5 mg/dL 8.0  8.3  8.1   Total Protein 6.5 - 8.1 g/dL  6.5  6.4   Total Bilirubin <1.2 mg/dL  4.1  4.3   Alkaline Phos 38 - 126 U/L  120  136   AST 15 - 41 U/L  38  41   ALT 0 - 44 U/L  17  21     Lipid Panel  No results found for: "CHOL", "TRIG", "HDL", "CHOLHDL", "VLDL", "LDLCALC", "LDLDIRECT"  CBC    Component Value Date/Time   WBC 2.2 (L) 11/28/2022 0857   RBC 2.63 (L) 11/28/2022 0857   HGB 9.7 (L) 11/28/2022 0857   HGB 9.8 (L) 08/17/2022 1113   HCT 27.1 (L) 11/28/2022 0857   HCT 26.9 (L) 08/17/2022 1113   PLT 54 (L) 11/28/2022 0857   PLT 44 (LL) 08/17/2022 1113   MCV 103.0 (H) 11/28/2022 0857   MCV 97 08/17/2022 1113   MCH 36.9 (H) 11/28/2022 0857   MCHC 35.8 11/28/2022 0857   RDW 13.5 11/28/2022 0857   RDW 11.1 (L) 08/17/2022 1113   LYMPHSABS 0.6 (L) 11/28/2022 0857   LYMPHSABS 0.4 (L) 08/17/2022 1113   MONOABS 0.3 11/28/2022 0857   EOSABS 0.0 11/28/2022 0857   EOSABS 0.0 08/17/2022 1113   BASOSABS 0.0 11/28/2022 0857   BASOSABS 0.0 08/17/2022 1113    Lab Results  Component Value Date   HGBA1C 4.7 (L) 10/14/2019    Assessment & Plan:  Liver Cirrhosis with Ascites Recurrent ascites requiring paracentesis, with recent removal of 11.5 liters of fluid. Patient reports feeling that fluid has reaccumulated. Noted continued alcohol consumption despite advice to abstain, which is exacerbating the condition. -Schedule paracentesis to remove accumulated fluid. -He needs a standing order for IR paracentesis with albumin administration -Emphasize importance of complete alcohol abstinence to slow progression of liver disease. -Refill all current medications for liver cirrhosis. -Order blood work to monitor liver  function. -Case manager called to assist in obtaining appointment with GI and he will be seeing them tomorrow.  Alcohol use disorder -Unfortunately he continues to drink and is not ready to quit -He is not motivated to be referred to AA  Umbilical Hernia Patient reports discomfort from hernia, but no acute pain. Hernia likely exacerbated by recurrent ascites. -Explain that hernia repair is not advisable until ascites is better controlled. -Continue to monitor hernia for signs of complications.  Follow-up -Ensure patient has contact information for gastroenterologist (Dr. Fritzi Mandes) for ongoing management of liver cirrhosis. -Plan for follow-up after next paracentesis to assess response and adjust treatment plan as needed.          Meds ordered this encounter  Medications   furosemide (LASIX) 20 MG tablet    Sig: Take 1 tablet (20 mg total) by mouth daily.    Dispense:  90 tablet    Refill:  0   lactulose (CHRONULAC) 10 GM/15ML solution    Sig: Take 45 mLs (30 g total) by mouth 2 (two) times daily.    Dispense:  473 mL    Refill:  1   midodrine (PROAMATINE) 10 MG tablet    Sig: Take 1 tablet (10 mg total) by mouth 3 (three) times daily with meals.    Dispense:  270 tablet    Refill:  0   spironolactone (ALDACTONE) 50 MG tablet    Sig: Take 1 tablet (50 mg total) by mouth daily.    Dispense:  90 tablet    Refill:  0   albumin human 25 % bottle    Sig: Inject 100 mLs (25 g total) into the vein once for 1 dose.    Dispense:  50 mL    Refill:  0    Administer during paracentessis    Follow-up: cancel previous appointment to establish care with Dr. [name] and reschedule for 6 weeks due to the fact that he will be seeing GI in 2 days.      Hoy Register, MD, FAAFP. [facility] [place], [place] [phone]   12/05/2022, 5:41 PM

## 2022-12-06 ENCOUNTER — Ambulatory Visit (INDEPENDENT_AMBULATORY_CARE_PROVIDER_SITE_OTHER): Payer: Self-pay | Admitting: Gastroenterology

## 2022-12-06 ENCOUNTER — Encounter: Payer: Self-pay | Admitting: Gastroenterology

## 2022-12-06 VITALS — BP 118/62 | HR 111 | Ht 65.0 in | Wt 187.4 lb

## 2022-12-06 DIAGNOSIS — F1090 Alcohol use, unspecified, uncomplicated: Secondary | ICD-10-CM

## 2022-12-06 DIAGNOSIS — K7031 Alcoholic cirrhosis of liver with ascites: Secondary | ICD-10-CM

## 2022-12-06 DIAGNOSIS — E871 Hypo-osmolality and hyponatremia: Secondary | ICD-10-CM

## 2022-12-06 LAB — BASIC METABOLIC PANEL
BUN/Creatinine Ratio: 13 (ref 9–20)
BUN: 10 mg/dL (ref 6–24)
CO2: 17 mmol/L — ABNORMAL LOW (ref 20–29)
Calcium: 7.6 mg/dL — ABNORMAL LOW (ref 8.7–10.2)
Chloride: 92 mmol/L — ABNORMAL LOW (ref 96–106)
Creatinine, Ser: 0.75 mg/dL — ABNORMAL LOW (ref 0.76–1.27)
Glucose: 138 mg/dL — ABNORMAL HIGH (ref 70–99)
Potassium: 4.7 mmol/L (ref 3.5–5.2)
Sodium: 121 mmol/L — ABNORMAL LOW (ref 134–144)
eGFR: 114 mL/min/{1.73_m2} (ref >59)

## 2022-12-06 NOTE — Patient Instructions (Addendum)
Your provider has requested that you go to the basement level for lab work before leaving today. Press "B" on the elevator. The lab is located at the first door on the left as you exit the elevator.  You have been scheduled for an abdominal paracentesis at [facility] radiology (1st floor of hospital) on 12/21/22 at 1 pm. Please arrive at least 130 minutes prior to your appointment time for registration. Should you need to reschedule this appointment for any reason, please call our office at [phone].   _______________________________________________________  If your blood pressure at your visit was 140/90 or greater, please contact your primary care physician to follow up on this.  _______________________________________________________  If you are age 44 or older, your body mass index should be between 23-30. Your Body mass index is 31.18 kg/m. If this is out of the aforementioned range listed, please consider follow up with your Primary Care Provider.  If you are age 49 or younger, your body mass index should be between 19-25. Your Body mass index is 31.18 kg/m. If this is out of the aformentioned range listed, please consider follow up with your Primary Care Provider.   ________________________________________________________  The  GI providers would like to encourage you to use [facility] to communicate with providers for non-urgent requests or questions.  Due to long hold times on the telephone, sending your provider a message by [facility] may be a faster and more efficient way to get a response.  Please allow 48 business hours for a response.  Please remember that this is for non-urgent requests.  _______________________________________________________  Due to recent changes in healthcare laws, you may see the results of your imaging and laboratory studies on MyChart before your provider has had a chance to review them.  We understand that in some cases there may be results that are  confusing or concerning to you. Not all laboratory results come back in the same time frame and the provider may be waiting for multiple results in order to interpret others.  Please give [place] 48 hours in order for your provider to thoroughly review all the results before contacting the office for clarification of your results.   Thank you for entrusting me with your care and choosing [facility].  [name]

## 2022-12-06 NOTE — Progress Notes (Signed)
HPI :  42 year old male here for follow-up visit for cirrhosis /alcoholic liver disease /alcohol abuse.  See my last office note dated November 8 for full details of this case.  Recall he has a history of decompensated alcoholic cirrhosis.  He has had a very complicated course with multiple hospitalizations this year.  I saw him in May when he was admitted with SBP.  He has problems with recurrent ascites leading to repeated paracentesis.  He has esophageal varices, documented on prior EGD few years ago, as well as concern for hepatic encephalopathy in the past. Recall he was hospitalized in September for E. coli bacteremia.  Recall he has had resistant E. coli when hospitalized for prior SBP for which no oral antibiotics would cover.  His prophylaxis has been to prophylax against the more common causes of SBP.  He was referred to [facility] hepatology back in May but he never made it to that appointment.  I saw him on November 8.  At that time he was actively drinking alcohol.  Main symptoms that bother him is ascites along with periumbilical hernia.  He is also jaundiced, has known varices but they have not bled yet.  Spent a significant amount of time talking with him about alcohol use, and recommendations for his medications at the last visit. RN Robyne Peers was incredibly helpful since our last visit to get him his medications, he could not afford them and [facility] is covering his medications.  He is due for an EGD, I recommended he be referred to a hepatologist, he does not have insurance and has not been possible.  He has applied for insurance and waiting to hear back since the last visit.  He has been seen in the ED last week for worsening ascites since of last seen him.  He was placed on Lasix 20 mg and Aldactone 50 mg daily since the last visit he endorses compliance with that.  His sodium has unfortunately dropped from high 120s to 121 since starting this.  I recommended a low-sodium diet for him,  unclear if he is compliant with that or not.  He has continued to drink alcohol, states he last had a drink a week ago prior to presenting to the ED for worsening ascites.  He had a paracentesis and they removed 11 L of fluid.  He is on midodrine for hypotension and to hopefully better tolerate diuretics.  We stopped PPI given his history of SBP.  He is on Bactrim for SBP prophylaxis.  We have not been able to place him on beta-blocker given the worsening ascites.  He is jaundiced today in the office.  His abdomen he states has reaccumulated fluid and has another paracentesis pending for tomorrow that was set up by Dr. Alvis Lemmings.  He was unclear about dates and times of his appointments.  He is on lactulose and states he is tolerating it.  Lab Results  Component Value Date   NA 121 (L) 12/05/2022   K 4.7 12/05/2022   CO2 17 (L) 12/05/2022   GLUCOSE 138 (H) 12/05/2022   BUN 10 12/05/2022   CREATININE 0.75 (L) 12/05/2022   CALCIUM 7.6 (L) 12/05/2022   GFR 106.05 12/01/2022   EGFR 114 12/05/2022   GFRNONAA >60 11/28/2022   Lab Results  Component Value Date   INR 2.1 (H) 11/29/2022   INR 1.9 (H) 11/28/2022   INR 1.7 (H) 11/15/2022   Lab Results  Component Value Date   ALT 17 11/28/2022  AST 38 11/28/2022   ALKPHOS 120 11/28/2022   BILITOT 4.1 (H) 11/28/2022   MELD 3.0: 25 at 11/29/2022  4:01 AM MELD-Na: 26 at 11/29/2022  4:01 AM Calculated from: Serum Creatinine: 0.69 mg/dL (Using min of 1 mg/dL) at 45/40/9811  9:14 AM Serum Sodium: 128 mmol/L at 11/28/2022  8:57 AM Total Bilirubin: 4.1 mg/dL at 78/29/5621  3:08 AM Serum Albumin: 2.9 g/dL at 65/78/4696  2:95 AM INR(ratio): 2.1 at 11/29/2022  4:01 AM Age at listing (hypothetical): 42 years Sex: Male at 11/29/2022  4:01 AM   Last set of imaging with a CT abdomen pelvis with contrast in September which showed no hepatomas.   Previous GI workup EGD 03/2018 inpatient for IDA with Dr. Matthias Hughs: Grade 2 esophageal varices, 2 cm hiatal  hernia, portal hypertensive gastropathy, friable gastric mucosa, normal duodenum.   Colonoscopy 03/2018 for IDA: Friable colonic mucosa, no specimens collected    CT abdomen / pelvis 09/26/22 -  IMPRESSION: 1. Cirrhosis with large volume simple ascites. 2. No bowel obstruction. The umbilical hernia contains ascitic fluid. 3. Cholelithiasis. 4. Small left pleural effusion.    Past Medical History:  Diagnosis Date   Acute metabolic encephalopathy 06/20/2021   Alcohol withdrawal seizure (HCC)    Cirrhosis (HCC)    ETOH abuse    Pneumonia 04/02/2020   SBP (spontaneous bacterial peritonitis) (HCC) 06/17/2021     Past Surgical History:  Procedure Laterality Date   COLONOSCOPY WITH PROPOFOL N/A 03/17/2018   Procedure: COLONOSCOPY WITH PROPOFOL;  Surgeon: Bernette Redbird, MD;  Location: [facility] ENDOSCOPY;  Service: Endoscopy;  Laterality: N/A;   ESOPHAGOGASTRODUODENOSCOPY (EGD) WITH PROPOFOL N/A 03/17/2018   Procedure: ESOPHAGOGASTRODUODENOSCOPY (EGD) WITH PROPOFOL;  Surgeon: Bernette Redbird, MD;  Location: [facility] ENDOSCOPY;  Service: Endoscopy;  Laterality: N/A;   IR PARACENTESIS  03/14/2018   IR PARACENTESIS  11/18/2020   IR PARACENTESIS  06/17/2021   IR PARACENTESIS  08/30/2021   IR PARACENTESIS  09/02/2021   IR PARACENTESIS  11/16/2021   IR PARACENTESIS  03/01/2022   IR PARACENTESIS  05/31/2022   IR PARACENTESIS  06/02/2022   IR PARACENTESIS  06/23/2022   IR PARACENTESIS  07/01/2022   IR PARACENTESIS  08/18/2022   IR PARACENTESIS  09/26/2022   IR PARACENTESIS  11/07/2022   Family History  Adopted: Yes  Problem Relation Age of Onset   Liver disease Neg Hx    Esophageal cancer Neg Hx    Colon cancer Neg Hx    Social History   Tobacco Use   Smoking status: Every Day    Current packs/day: 0.25    Average packs/day: 0.3 packs/day for 3.3 years (0.8 ttl pk-yrs)    Types: Cigarettes    Start date: 08/11/2019   Smokeless tobacco: Never  Vaping Use   Vaping status: Never Used  Substance Use Topics    Alcohol use: Yes    Alcohol/week: 2.0 standard drinks of alcohol    Types: 2 Cans of beer per week    Comment: 3-6 beer cans a week   Drug use: No   Current Outpatient Medications  Medication Sig Dispense Refill   albumin human 25 % bottle Inject 100 mLs (25 g total) into the vein once for 1 dose. 50 mL 0   ferrous sulfate 325 (65 FE) MG tablet Take 1 tablet (325 mg total) by mouth daily with breakfast. 60 tablet 3   folic acid (FOLVITE) 1 MG tablet Take 1 tablet (1 mg total) by mouth daily. 60 tablet 1  furosemide (LASIX) 20 MG tablet Take 1 tablet (20 mg total) by mouth daily. 90 tablet 0   lactulose (CHRONULAC) 10 GM/15ML solution Take 45 mLs (30 g total) by mouth 2 (two) times daily. 473 mL 1   midodrine (PROAMATINE) 10 MG tablet Take 1 tablet (10 mg total) by mouth 3 (three) times daily with meals. 270 tablet 0   spironolactone (ALDACTONE) 50 MG tablet Take 1 tablet (50 mg total) by mouth daily. 90 tablet 0   sulfamethoxazole-trimethoprim (BACTRIM DS) 800-160 MG tablet Take 1 tablet by mouth daily. 90 tablet 0   No current facility-administered medications for this visit.   No Known Allergies   Review of Systems: All systems reviewed and negative except where noted in HPI.    US Paracentesis  Result Date: 11/29/2022 INDICATION: Patient with history of alcoholic cirrhosis, prior spontaneous bacterial peritonitis, recurrent ascites. Request received for diagnostic and therapeutic paracentesis. EXAM: ULTRASOUND GUIDED DIAGNOSTIC AND THERAPEUTIC PARACENTESIS MEDICATIONS: 8 ml 1% lidocaine COMPLICATIONS: None immediate. PROCEDURE: Informed written consent was obtained from the patient via interpreter after a discussion of the risks, benefits and alternatives to treatment. A timeout was performed prior to the initiation of the procedure. Initial ultrasound scanning demonstrates a large amount of ascites within the right lower abdominal quadrant. The right lower abdomen was prepped and  draped in the usual sterile fashion. 1% lidocaine was used for local anesthesia. Following this, a 19 gauge, 7-cm, Yueh catheter was introduced. An ultrasound image was saved for documentation purposes. The paracentesis was performed. The catheter was removed and a dressing was applied. The patient tolerated the procedure well without immediate post procedural complication. Patient received post-procedure intravenous albumin; see nursing notes for details. FINDINGS: A total of approximately 11.5 liters of yellow fluid was removed. Samples were sent to the laboratory as requested by the clinical team. IMPRESSION: Successful ultrasound-guided diagnostic and therapeutic paracentesis yielding 11.5 liters of peritoneal fluid. PLAN: The patient has previously been formally evaluated by the [facility] Interventional Radiology Portal Hypertension Clinic and is being actively followed for potential future intervention. Performed by: Jannette Spanner Electronically Signed   By: Marliss Coots M.D.   On: 11/29/2022 10:59   US Paracentesis  Result Date: 11/15/2022 INDICATION: Alcoholic cirrhosis with recurrent ascites. Request received for therapeutic and diagnostic paracentesis. EXAM: ULTRASOUND GUIDED  PARACENTESIS MEDICATIONS: 8 cc 1% lidocaine COMPLICATIONS: None immediate. PROCEDURE: Informed written consent was obtained from the patient after a discussion of the risks, benefits and alternatives to treatment. A timeout was performed prior to the initiation of the procedure. Initial ultrasound scanning demonstrates a large amount of ascites within the left lower abdominal quadrant. The left lower abdomen was prepped and draped in the usual sterile fashion. 1% lidocaine was used for local anesthesia. Following this, a 19 gauge, 7-cm, Yueh catheter was introduced. An ultrasound image was saved for documentation purposes. The paracentesis was performed. The catheter was removed and a dressing was  applied. The patient tolerated the procedure well without immediate post procedural complication. Patient received post-procedure intravenous albumin; see nursing notes for details. FINDINGS: A total of approximately 10 L of clear yellow fluid was removed. Samples were sent to the laboratory as requested by the clinical team. IMPRESSION: Successful ultrasound-guided paracentesis yielding 10 liters of peritoneal fluid. PLAN: The patient has previously been formally evaluated by the [facility] Interventional Radiology Portal Hypertension Clinic and is being actively followed for potential future intervention. Procedure performed by Mina Marble, PA-C Electronically Signed   By: Judie Petit.  Shick M.D.   On: 11/15/2022 16:00   IR Paracentesis  Result Date: 11/07/2022 INDICATION: 42 year old male. History of alcoholic cirrhosis with recurrent ascites. Request is for therapeutic and diagnostic paracentesis. Maximum of 4 L EXAM: ULTRASOUND GUIDED THERAPEUTIC AND DIAGNOSTIC PARACENTESIS MEDICATIONS: Lidocaine 1% 10 mL COMPLICATIONS: None immediate. PROCEDURE: Informed written consent was obtained from the patient after a discussion of the risks, benefits and alternatives to treatment. A timeout was performed prior to the initiation of the procedure. Initial ultrasound scanning demonstrates a large amount of ascites within the right lower abdominal quadrant. The right lower abdomen was prepped and draped in the usual sterile fashion. 1% lidocaine was used for local anesthesia. Following this, a 19 gauge, 7-cm, Yueh catheter was introduced. An ultrasound image was saved for documentation purposes. The paracentesis was performed. The catheter was removed and a dressing was applied. The patient tolerated the procedure well without immediate post procedural complication. FINDINGS: A total of approximately 4 L of straw-colored fluid was removed. Samples were sent to the laboratory as requested by the clinical team.  IMPRESSION: Successful ultrasound-guided therapeutic and diagnostic paracentesis yielding 4.4 liters of peritoneal fluid. Performed by Anders Grant NP PLAN: The patient has previously been formally evaluated by the [facility] Interventional Radiology Portal Hypertension Clinic and is being actively followed for potential future intervention. Electronically Signed   By: Marliss Coots M.D.   On: 11/07/2022 16:00    Physical Exam: BP 118/62   Pulse (!) 111   Ht 5\' 5"  (1.651 m)   Wt 187 lb 6.4 oz (85 kg)   BMI 31.18 kg/m  Constitutional: Pleasant,male in no acute distress. Abdominal: Soft, distended with ascites, nontender.  Extremities: (+) LE edema Psychiatric: Normal mood and affect. Behavior is normal.   ASSESSMENT: 42 y.o. male here for assessment of the following  1. Alcoholic cirrhosis of liver with ascites (HCC)   2. Alcohol use disorder   3. Hyponatremia    Lengthy discussion with the patient through translator today.  I have counseled him at length on the severity of his liver disease, he absolutely must abstain from alcohol but he has not done so to date.  I question how well he understands the severity of his liver disease and situation at this time despite being quite frank about risk for mortality and progressive worsening of his liver disease with alcohol use. He is quite decompensated, high risk for mortality.  He absolutely must abstain from alcohol, I discussed this with him at length.  He seems to understand and states he will stop.  Denies withdrawal symptoms, states he last had a drink a week ago, will see how he does with time with this.  He does need assistance with this and encouraged use of free resources available to him, he does not have insurance and not sure his ability to get in with behavioral health, will speak with his case manager.  He is compliant with his diuretics, since making that change his sodium has progressively decreased and I am concerned  that is causing it with his continued alcohol use.  Unfortunately I need to stop his diuretics right now given worsening hyponatremia and repeat his labs in a few days.  He will continue his antibiotic, lactulose, midodrine.  Again not a candidate for beta-blocker due to his progressive ascites.  I think he is going to need serial paracentesis, scheduled tomorrow with IR, we will book him another 1 every 2 weeks.  His MELD is rather high  for a TIPS procedure, do not think he is a great candidate for that.  He needs an EGD, I have tried to refer him to hepatology but he does not have insurance.  This is a major barrier to his care.  He has completed the paperwork for insurance but has no idea how long that will take to come back, we will touch base with the social worker about this.  He is very high risk for recurrent hospitalization and progressive deterioration.  If he is not a candidate for transplant and continues to drink alcohol, will need to consider palliative care consultation.  I will reach out to his primary care team and social support at [facility] to update them about my concerns.  PLAN: - paracentesis tomorrow - will have him scheduled once every 2 weeks with IR for large volume paracentesis with albumin infusion. Cell count with diff when drawn - must stop all alcohol - encourage AA or other support groups - low Na diet - stop lasix / aldactone due to worsening hyponatremia since starting this - repeat lab Monday - BMET - continue bactrim, continue lactulose, continue midocrine - needs EGD, needs hepatology consultation - cannot do without insurance, poor candidate for TIPS - awaiting insurance application - working on this with Sports coach - f/u one month   Harlin Rain, MD [facility] Gastroenterology

## 2022-12-07 ENCOUNTER — Ambulatory Visit (HOSPITAL_COMMUNITY)
Admission: RE | Admit: 2022-12-07 | Discharge: 2022-12-07 | Disposition: A | Discharge status: Home / Self Care | Payer: Self-pay | Source: Ambulatory Visit | Attending: Family Medicine | Admitting: Family Medicine

## 2022-12-07 ENCOUNTER — Ambulatory Visit: Payer: Self-pay | Admitting: Licensed Clinical Social Worker

## 2022-12-07 ENCOUNTER — Ambulatory Visit: Payer: Self-pay

## 2022-12-07 DIAGNOSIS — K7290 Hepatic failure, unspecified without coma: Secondary | ICD-10-CM | POA: Insufficient documentation

## 2022-12-07 DIAGNOSIS — R188 Other ascites: Secondary | ICD-10-CM | POA: Insufficient documentation

## 2022-12-07 DIAGNOSIS — K7460 Unspecified cirrhosis of liver: Secondary | ICD-10-CM | POA: Insufficient documentation

## 2022-12-07 HISTORY — PX: IR PARACENTESIS: IMG2679

## 2022-12-07 MED ORDER — LIDOCAINE HCL 1 % IJ SOLN
INTRAMUSCULAR | Status: AC
  Filled 2022-12-07: qty 20

## 2022-12-07 MED ORDER — LIDOCAINE HCL 1 % IJ SOLN: 20.0000 mL | Freq: Once | INTRAMUSCULAR | Status: DC

## 2022-12-07 MED ORDER — ALBUMIN HUMAN 25 % IV SOLN
25.0000 g | Freq: Once | INTRAVENOUS | Status: AC
  Administered 2022-12-07: 25 g via INTRAVENOUS

## 2022-12-07 MED ORDER — ALBUMIN HUMAN 25 % IV SOLN
INTRAVENOUS | Status: AC
  Filled 2022-12-07: qty 200

## 2022-12-07 MED ORDER — ALBUMIN HUMAN 25 % IV SOLN
INTRAVENOUS | Status: AC
  Filled 2022-12-07: qty 100

## 2022-12-07 MED ORDER — ALBUMIN HUMAN 25 % IV SOLN
50.0000 g | Freq: Once | INTRAVENOUS | Status: AC
  Administered 2022-12-07: 50 g via INTRAVENOUS

## 2022-12-07 NOTE — Patient Outreach (Signed)
  Care Coordination   Initial Visit Note   12/07/2022 Name: Mathew [name] MRN: [id number] DOB: [birth year]  Mathew [name] is a 43 y.o. year old male who sees [name], MD for primary care. I spoke with  Mathew [name] by phone today.  What matters to the patients health and wellness today?  SDOH- transportation, Medicaid and finances    Goals Addressed             This Visit's Progress    Care Coordination Activities       Care Coordination Interventions: Patient stated that his brother will assist with transportation to medical appointments and when his brother can't the patient take the car and transport himself. The patient stated that individuals at his PCP have been helping him with the Medicaid and that he believes that he has submitted all the necessary paperwork, but he has not heard anything. The SW discussed the [facility] card and the Medicaid walk in clinic and provided the information. The Patient stated that he  will go around 1:00 pm today to apply. The patient stated that he has a hernia and ulcer and that he does not eat a lot, but would like for the SW to send any food resources to him and mail it to the following address where he is currently living: [address], [place], [place] [address]. Patient stated that he is currently not working, but once his health is under control he wants to return back to work.  Patient is living with two other people and they are paying the bills. The SW reviewed the SDOH questions and no other concerns than what is listed above.  SW will follow p with the patient on 1217/2024 at 9:45 am.         SDOH assessments and interventions completed:  Yes  SDOH Interventions Today    Flowsheet Row Most Recent Value  SDOH Interventions   Food Insecurity Interventions --  [SW will mail Spanish [employer] Resources]  Housing Interventions Intervention Not Indicated  [Patient lives with two other people]   Transportation Interventions Intervention Not Indicated  [Patients brother takes him to appointments and he also drives  his brothers car]  Utilities Interventions Intervention Not Indicated  [Patient is out of work and family assist with bills]        Care Coordination Interventions:  Yes, provided  Interventions Today    Flowsheet Row Most Recent Value  General Interventions   General Interventions Discussed/Reviewed General Interventions Discussed, [employer] is going ot apply for Medicaid and orange card, SW will mail out resources]        Follow up plan: Follow up call scheduled for 12/27/2022 at 9:45 am    Encounter Outcome:  Patient Visit Completed   Jeanie Cooks, PhD [facility], [facility] Social Worker Direct Dial: [phone]  Fax: [phone]

## 2022-12-07 NOTE — Procedures (Signed)
PROCEDURE SUMMARY:  Successful image-guided paracentesis from the right lower abdomen.  Yielded 10 liters of hazy yellow fluid.  No immediate complications.  EBL = trace. Patient tolerated well.   Specimen was not sent for labs.  Please see imaging section of Epic for full dictation.  The patient has previously been formally evaluated by the [facility] Interventional Radiology Portal Hypertension Clinic and is being actively followed for potential future intervention.   Lynann Bologna Reshaun Briseno PA-C 12/07/2022 1:29 PM

## 2022-12-07 NOTE — Patient Instructions (Signed)
Visit Information  Thank you for taking time to visit with me today. Please don't hesitate to contact me if I can be of assistance to you.   Following are the goals we discussed today:   Goals Addressed             This Visit's Progress    Care Coordination Activities       Care Coordination Interventions: Patient stated that his brother will assist with transportation to medical appointments and when his brother can't the patient take the car and transport himself. The patient stated that individuals at his PCP have been helping him with the Medicaid and that he believes that he has submitted all the necessary paperwork, but he has not heard anything. The SW discussed the [facility] card and the Medicaid walk in clinic and provided the information. The Patient stated that he  will go around 1:00 pm today to apply. The patient stated that he has a hernia and ulcer and that he does not eat a lot, but would like for the SW to send any food resources to him and mail it to the following address where he is currently living: [address] [place], [place] [address]. Patient stated that he is currently not working, but once his health is under control he wants to return back to work.  Patient is living with two other people and they are paying the bills. The SW reviewed the SDOH questions and no other concerns than what is listed above.  SW will follow p with the patient on 1217/2024 at 9:45 am.         Our next appointment is by telephone on 12/27/2022 at 9:45 am  Please call the care guide team at [phone] if you need to cancel or reschedule your appointment.   If you are experiencing a Mental Health or Behavioral Health Crisis or need someone to talk to, please call the Suicide and Crisis Lifeline: 988 go to [facility] Urgent [facility] [address] [place] [phone]) call 911  Patient verbalizes understanding of instructions and care plan provided today  and agrees to view in MyChart. Active MyChart status and patient understanding of how to access instructions and care plan via MyChart confirmed with patient.     Jeanie Cooks, PhD [facility], [facility] Social Worker Direct Dial: [phone]  Fax: [phone]

## 2022-12-07 NOTE — Patient Outreach (Signed)
  Care Coordination   12/07/2022 Name: Mathew [name] MRN: [id number] DOB: [birth year]   Care Coordination Outreach Attempts:  An unsuccessful telephone outreach was attempted for a scheduled appointment today.  Follow Up Plan:  Additional outreach attempts will be made to offer the patient care coordination information and services.   Encounter Outcome:  Patient Request to Call Back   Care Coordination Interventions:  No, not indicated    Delsa Sale RN BSN CCM   [facility], [facility] Nurse Care Coordinator  Direct Dial: [phone] Website: [email or web address]

## 2022-12-12 ENCOUNTER — Other Ambulatory Visit (INDEPENDENT_AMBULATORY_CARE_PROVIDER_SITE_OTHER): Payer: Self-pay

## 2022-12-12 ENCOUNTER — Telehealth: Payer: Self-pay | Admitting: *Deleted

## 2022-12-12 DIAGNOSIS — F1090 Alcohol use, unspecified, uncomplicated: Secondary | ICD-10-CM

## 2022-12-12 DIAGNOSIS — E871 Hypo-osmolality and hyponatremia: Secondary | ICD-10-CM

## 2022-12-12 DIAGNOSIS — K7031 Alcoholic cirrhosis of liver with ascites: Secondary | ICD-10-CM

## 2022-12-12 LAB — BASIC METABOLIC PANEL
BUN: 14 mg/dL (ref 6–23)
CO2: 19 meq/L (ref 19–32)
Calcium: 8 mg/dL — ABNORMAL LOW (ref 8.4–10.5)
Chloride: 100 meq/L (ref 96–112)
Creatinine, Ser: 0.83 mg/dL (ref 0.40–1.50)
GFR: 106.41 mL/min (ref >60.00)
Glucose, Bld: 140 mg/dL — ABNORMAL HIGH (ref 70–99)
Potassium: 3.7 meq/L (ref 3.5–5.1)
Sodium: 126 meq/L — ABNORMAL LOW (ref 135–145)

## 2022-12-12 NOTE — Progress Notes (Unsigned)
  Care Coordination Note  12/12/2022 Name: Mathew [name] MRN: [id number] DOB: [birth year]  Mathew [name] is a 42 y.o. year old male who is a primary care patient of [name], MD and is actively engaged with the care management team. I reached out to [employer] [name] by phone today to assist with re-scheduling an initial visit with the RN Case Manager. Using [employer] [phone] named Mathew [name].   Follow up plan: Unsuccessful telephone outreach attempt made.   [facility]  Care Coordination Care Guide  Direct Dial: [phone]'

## 2022-12-13 ENCOUNTER — Other Ambulatory Visit: Payer: Self-pay | Admitting: *Deleted

## 2022-12-13 DIAGNOSIS — K7031 Alcoholic cirrhosis of liver with ascites: Secondary | ICD-10-CM

## 2022-12-13 DIAGNOSIS — E871 Hypo-osmolality and hyponatremia: Secondary | ICD-10-CM

## 2022-12-13 NOTE — Progress Notes (Signed)
  Care Coordination Note  12/13/2022 Name: Mathew [name] MRN: [id number] DOB: 23-Dec-1980  Mathew [name] is a 42 y.o. year old male who is a primary care patient of [name], MD and is actively engaged with the care management team. I reached out to [facility] [name] by phone today to assist with re-scheduling an initial visit with the RN Case Manager  Follow up plan: Telephone appointment with care management team member scheduled for:01/16/23  [facility] Coordination Care Guide  Direct Dial: [phone]

## 2022-12-14 ENCOUNTER — Other Ambulatory Visit: Payer: Self-pay

## 2022-12-21 ENCOUNTER — Encounter: Payer: Self-pay | Admitting: Radiology

## 2022-12-21 ENCOUNTER — Ambulatory Visit (HOSPITAL_COMMUNITY)
Admission: RE | Admit: 2022-12-21 | Discharge: 2022-12-21 | Disposition: A | Discharge status: Home / Self Care | Payer: Self-pay | Source: Ambulatory Visit | Attending: Gastroenterology | Admitting: Gastroenterology

## 2022-12-21 DIAGNOSIS — E871 Hypo-osmolality and hyponatremia: Secondary | ICD-10-CM | POA: Insufficient documentation

## 2022-12-21 DIAGNOSIS — K7031 Alcoholic cirrhosis of liver with ascites: Secondary | ICD-10-CM | POA: Insufficient documentation

## 2022-12-21 DIAGNOSIS — K766 Portal hypertension: Secondary | ICD-10-CM | POA: Insufficient documentation

## 2022-12-21 DIAGNOSIS — F1090 Alcohol use, unspecified, uncomplicated: Secondary | ICD-10-CM | POA: Insufficient documentation

## 2022-12-21 HISTORY — PX: IR PARACENTESIS: IMG2679

## 2022-12-21 LAB — BODY FLUID CELL COUNT WITH DIFFERENTIAL
Eos, Fluid: 0 %
Lymphs, Fluid: 71 %
Monocyte-Macrophage-Serous Fluid: 26 % — ABNORMAL LOW (ref 50–90)
Neutrophil Count, Fluid: 3 % (ref 0–25)
Total Nucleated Cell Count, Fluid: 93 uL (ref 0–1000)

## 2022-12-21 MED ORDER — ALBUMIN HUMAN 25 % IV SOLN
INTRAVENOUS | Status: AC
  Filled 2022-12-21: qty 200

## 2022-12-21 MED ORDER — LIDOCAINE HCL 1 % IJ SOLN
INTRAMUSCULAR | Status: AC
  Filled 2022-12-21: qty 20

## 2022-12-21 MED ORDER — ALBUMIN HUMAN 25 % IV SOLN
50.0000 g | Freq: Once | INTRAVENOUS | Status: AC
  Administered 2022-12-21: 50 g via INTRAVENOUS

## 2022-12-21 MED ORDER — LIDOCAINE HCL 1 % IJ SOLN
20.0000 mL | Freq: Once | INTRAMUSCULAR | Status: AC
  Administered 2022-12-21: 10 mL via INTRADERMAL

## 2022-12-21 NOTE — Procedures (Signed)
PROCEDURE SUMMARY:   Successful US guided paracentesis from left lower abdomen.  Yielded 5 L of clear, yellow fluid.  No immediate complications.  Pt tolerated well.   Specimen was sent for labs.  EBL < 5mL  Interpretation services were utilized during this procedure.   Kana Reimann N Elchonon Maxson PA-C 12/21/2022 1:27 PM

## 2022-12-22 LAB — PATHOLOGIST SMEAR REVIEW: Path Review: REACTIVE

## 2022-12-26 ENCOUNTER — Other Ambulatory Visit: Payer: Self-pay

## 2022-12-26 ENCOUNTER — Inpatient Hospital Stay (HOSPITAL_COMMUNITY)
Admission: EM | Admit: 2022-12-26 | Discharge: 2022-12-31 | DRG: 433 | Disposition: A | Discharge status: Hospice | Attending: Family Medicine | Admitting: Family Medicine

## 2022-12-26 ENCOUNTER — Ambulatory Visit: Payer: Self-pay | Admitting: Internal Medicine

## 2022-12-26 ENCOUNTER — Emergency Department (HOSPITAL_COMMUNITY): Payer: Self-pay

## 2022-12-26 ENCOUNTER — Encounter (HOSPITAL_COMMUNITY): Payer: Self-pay

## 2022-12-26 DIAGNOSIS — Z5982 Transportation insecurity: Secondary | ICD-10-CM

## 2022-12-26 DIAGNOSIS — Z79899 Other long term (current) drug therapy: Secondary | ICD-10-CM

## 2022-12-26 DIAGNOSIS — E871 Hypo-osmolality and hyponatremia: Secondary | ICD-10-CM | POA: Diagnosis present

## 2022-12-26 DIAGNOSIS — Z560 Unemployment, unspecified: Secondary | ICD-10-CM

## 2022-12-26 DIAGNOSIS — Z66 Do not resuscitate: Secondary | ICD-10-CM | POA: Diagnosis not present

## 2022-12-26 DIAGNOSIS — K429 Umbilical hernia without obstruction or gangrene: Secondary | ICD-10-CM | POA: Diagnosis present

## 2022-12-26 DIAGNOSIS — K7031 Alcoholic cirrhosis of liver with ascites: Principal | ICD-10-CM | POA: Diagnosis present

## 2022-12-26 DIAGNOSIS — E861 Hypovolemia: Secondary | ICD-10-CM | POA: Diagnosis present

## 2022-12-26 DIAGNOSIS — Z5986 Financial insecurity: Secondary | ICD-10-CM

## 2022-12-26 DIAGNOSIS — K7460 Unspecified cirrhosis of liver: Principal | ICD-10-CM

## 2022-12-26 DIAGNOSIS — R000 Tachycardia, unspecified: Secondary | ICD-10-CM | POA: Diagnosis present

## 2022-12-26 DIAGNOSIS — D689 Coagulation defect, unspecified: Secondary | ICD-10-CM | POA: Diagnosis present

## 2022-12-26 DIAGNOSIS — I9589 Other hypotension: Secondary | ICD-10-CM | POA: Diagnosis present

## 2022-12-26 DIAGNOSIS — D61818 Other pancytopenia: Secondary | ICD-10-CM | POA: Diagnosis present

## 2022-12-26 DIAGNOSIS — D539 Nutritional anemia, unspecified: Secondary | ICD-10-CM | POA: Diagnosis present

## 2022-12-26 DIAGNOSIS — Z5941 Food insecurity: Secondary | ICD-10-CM

## 2022-12-26 DIAGNOSIS — K7210 Chronic hepatic failure without coma: Secondary | ICD-10-CM | POA: Diagnosis present

## 2022-12-26 DIAGNOSIS — Z515 Encounter for palliative care: Secondary | ICD-10-CM

## 2022-12-26 DIAGNOSIS — F1090 Alcohol use, unspecified, uncomplicated: Secondary | ICD-10-CM | POA: Diagnosis present

## 2022-12-26 DIAGNOSIS — Z603 Acculturation difficulty: Secondary | ICD-10-CM | POA: Diagnosis present

## 2022-12-26 DIAGNOSIS — F17210 Nicotine dependence, cigarettes, uncomplicated: Secondary | ICD-10-CM | POA: Diagnosis present

## 2022-12-26 DIAGNOSIS — R188 Other ascites: Principal | ICD-10-CM | POA: Diagnosis present

## 2022-12-26 DIAGNOSIS — Z720 Tobacco use: Secondary | ICD-10-CM | POA: Diagnosis present

## 2022-12-26 DIAGNOSIS — K766 Portal hypertension: Secondary | ICD-10-CM | POA: Diagnosis present

## 2022-12-26 DIAGNOSIS — D7589 Other specified diseases of blood and blood-forming organs: Secondary | ICD-10-CM | POA: Diagnosis present

## 2022-12-26 DIAGNOSIS — F1010 Alcohol abuse, uncomplicated: Secondary | ICD-10-CM | POA: Diagnosis present

## 2022-12-26 DIAGNOSIS — K7030 Alcoholic cirrhosis of liver without ascites: Secondary | ICD-10-CM | POA: Diagnosis present

## 2022-12-26 DIAGNOSIS — I5032 Chronic diastolic (congestive) heart failure: Secondary | ICD-10-CM | POA: Diagnosis present

## 2022-12-26 LAB — COMPREHENSIVE METABOLIC PANEL
ALT: 18 U/L (ref 0–44)
AST: 31 U/L (ref 15–41)
Albumin: 2.9 g/dL — ABNORMAL LOW (ref 3.5–5.0)
Alkaline Phosphatase: 109 U/L (ref 38–126)
Anion gap: 8 (ref 5–15)
BUN: 14 mg/dL (ref 6–20)
CO2: 16 mmol/L — ABNORMAL LOW (ref 22–32)
Calcium: 8.3 mg/dL — ABNORMAL LOW (ref 8.9–10.3)
Chloride: 100 mmol/L (ref 98–111)
Creatinine, Ser: 0.72 mg/dL (ref 0.61–1.24)
GFR, Estimated: >60 mL/min (ref >60)
Glucose, Bld: 112 mg/dL — ABNORMAL HIGH (ref 70–99)
Potassium: 4.6 mmol/L (ref 3.5–5.1)
Sodium: 124 mmol/L — ABNORMAL LOW (ref 135–145)
Total Bilirubin: 4.4 mg/dL — ABNORMAL HIGH (ref <1.2)
Total Protein: 5.9 g/dL — ABNORMAL LOW (ref 6.5–8.1)

## 2022-12-26 LAB — URINALYSIS, ROUTINE W REFLEX MICROSCOPIC
Bacteria, UA: NONE SEEN
Bilirubin Urine: NEGATIVE
Glucose, UA: NEGATIVE mg/dL
Ketones, ur: NEGATIVE mg/dL
Leukocytes,Ua: NEGATIVE
Nitrite: NEGATIVE
Protein, ur: NEGATIVE mg/dL
Specific Gravity, Urine: 1.027 (ref 1.005–1.030)
pH: 6 (ref 5.0–8.0)

## 2022-12-26 LAB — CBC
HCT: 22.5 % — ABNORMAL LOW (ref 39.0–52.0)
Hemoglobin: 7.7 g/dL — ABNORMAL LOW (ref 13.0–17.0)
MCH: 36.2 pg — ABNORMAL HIGH (ref 26.0–34.0)
MCHC: 34.2 g/dL (ref 30.0–36.0)
MCV: 105.6 fL — ABNORMAL HIGH (ref 80.0–100.0)
Platelets: 75 10*3/uL — ABNORMAL LOW (ref 150–400)
RBC: 2.13 MIL/uL — ABNORMAL LOW (ref 4.22–5.81)
RDW: 14.9 % (ref 11.5–15.5)
WBC: 3.9 10*3/uL — ABNORMAL LOW (ref 4.0–10.5)
nRBC: 0 % (ref 0.0–0.2)

## 2022-12-26 LAB — LIPASE, BLOOD: Lipase: 61 U/L — ABNORMAL HIGH (ref 11–51)

## 2022-12-26 LAB — PROTIME-INR
INR: 1.8 — ABNORMAL HIGH (ref 0.8–1.2)
Prothrombin Time: 20.7 s — ABNORMAL HIGH (ref 11.4–15.2)

## 2022-12-26 LAB — SODIUM, URINE, RANDOM: Sodium, Ur: <10 mmol/L

## 2022-12-26 LAB — RETICULOCYTES
Immature Retic Fract: 16.6 % — ABNORMAL HIGH (ref 2.3–15.9)
RBC.: 2.03 MIL/uL — ABNORMAL LOW (ref 4.22–5.81)
Retic Count, Absolute: 202.6 10*3/uL — ABNORMAL HIGH (ref 19.0–186.0)
Retic Ct Pct: 10 % — ABNORMAL HIGH (ref 0.4–3.1)

## 2022-12-26 LAB — OSMOLALITY, URINE: Osmolality, Ur: 387 mosm/kg (ref 300–900)

## 2022-12-26 LAB — CREATININE, URINE, RANDOM: Creatinine, Urine: 170 mg/dL

## 2022-12-26 MED ORDER — ALBUMIN HUMAN 25 % IV SOLN
12.5000 g | Freq: Once | INTRAVENOUS | Status: AC
  Administered 2022-12-27: 12.5 g via INTRAVENOUS
  Filled 2022-12-26: qty 50

## 2022-12-26 MED ORDER — LACTULOSE 10 GM/15ML PO SOLN
30.0000 g | Freq: Every day | ORAL | Status: DC
Start: 2022-12-27 — End: 2022-12-31
  Administered 2022-12-27 – 2022-12-31 (×4): 30 g via ORAL
  Filled 2022-12-26 (×4): qty 45

## 2022-12-26 MED ORDER — MIDODRINE HCL 5 MG PO TABS
10.0000 mg | ORAL_TABLET | Freq: Three times a day (TID) | ORAL | Status: DC
  Administered 2022-12-27 – 2022-12-31 (×11): 10 mg via ORAL
  Filled 2022-12-26 (×11): qty 2

## 2022-12-26 MED ORDER — IOHEXOL 350 MG/ML SOLN
75.0000 mL | Freq: Once | INTRAVENOUS | Status: AC | PRN
  Administered 2022-12-26: 75 mL via INTRAVENOUS

## 2022-12-26 MED ORDER — FENTANYL CITRATE PF 50 MCG/ML IJ SOSY
50.0000 ug | PREFILLED_SYRINGE | Freq: Once | INTRAMUSCULAR | Status: AC
  Administered 2022-12-26: 50 ug via INTRAVENOUS
  Filled 2022-12-26: qty 1

## 2022-12-26 MED ORDER — ONDANSETRON HCL 4 MG PO TABS
4.0000 mg | ORAL_TABLET | Freq: Four times a day (QID) | ORAL | Status: DC | PRN
  Administered 2022-12-29: 4 mg via ORAL
  Filled 2022-12-26: qty 1

## 2022-12-26 MED ORDER — FERROUS SULFATE 325 (65 FE) MG PO TABS
325.0000 mg | ORAL_TABLET | Freq: Every day | ORAL | Status: DC
  Administered 2022-12-27 – 2022-12-29 (×3): 325 mg via ORAL
  Filled 2022-12-26 (×3): qty 1

## 2022-12-26 MED ORDER — FOLIC ACID 1 MG PO TABS
1.0000 mg | ORAL_TABLET | Freq: Every day | ORAL | Status: DC
  Administered 2022-12-27 – 2022-12-29 (×3): 1 mg via ORAL
  Filled 2022-12-26 (×3): qty 1

## 2022-12-26 MED ORDER — FENTANYL CITRATE PF 50 MCG/ML IJ SOSY
12.5000 ug | PREFILLED_SYRINGE | INTRAMUSCULAR | Status: DC | PRN
Start: 2022-12-26 — End: 2022-12-29
  Administered 2022-12-27 – 2022-12-29 (×7): 50 ug via INTRAVENOUS
  Filled 2022-12-26 (×7): qty 1

## 2022-12-26 MED ORDER — ONDANSETRON HCL 4 MG/2ML IJ SOLN
4.0000 mg | Freq: Four times a day (QID) | INTRAMUSCULAR | Status: DC | PRN
Start: 2022-12-26 — End: 2022-12-29
  Administered 2022-12-27: 4 mg via INTRAVENOUS
  Filled 2022-12-26: qty 2

## 2022-12-26 MED ORDER — NICOTINE 7 MG/24HR TD PT24
7.0000 mg | MEDICATED_PATCH | Freq: Every day | TRANSDERMAL | Status: DC
  Administered 2022-12-27 – 2022-12-31 (×4): 7 mg via TRANSDERMAL
  Filled 2022-12-26 (×5): qty 1

## 2022-12-26 NOTE — H&P (Signed)
Mathew [name] CWC:[id number] DOB: 1980/04/14 DOA: 12/26/2022     PCP: [name], MD   Outpatient Specialists:    Patient arrived to ER on 12/26/22 at 1047 Referred by Attending [name], MD   Patient coming from:    home Lives  With friends     Chief Complaint:   Chief Complaint  Patient presents with   Abdominal Pain    HPI: Mathew [name] is a 42 y.o. male with medical history significant of alcohol induced cirrhosis, ascites Pancytopenia, chronic diastolic grade 1 CHF  Presented with   abdominal pain distention Patient with history of alcoholic liver cirrhosis requiring paracentesis continues to drink Presents with abdominal pain Last paracentesis was done on 11 December 5 L of clear yellow fluid was taking by IR Patient here with abdominal distention and swelling which is rapidly reaccumulating Now having abdominal pain due to distention no fevers no chills no nausea no vomiting      ETOH intake   Does  smoke  but interested in quitting   Lab Results  Component Value Date   SARSCOV2NAA NEGATIVE 02/23/2021   SARSCOV2NAA NEGATIVE 04/02/2020        Regarding pertinent Chronic problems:   Hypotension on midodrine  Chronic diastolic CHF last echo  Recent Results (from the past 76160 hours)  ECHOCARDIOGRAM COMPLETE   Collection Time: 11/06/21  3:47 PM  Result Value   BP 110/58   S' Lateral 2.50   AR max vel 1.72   AV Area VTI 2.11   AV Mean grad 7.0   AV Peak grad 14.4   Ao pk vel 1.90   Area-P 1/2 2.66   AV Area mean vel 2.00   Narrative      ECHOCARDIOGRAM REPORT        IMPRESSIONS    1. Left ventricular ejection fraction, by estimation, is 65 to 70%. The left ventricle has normal function. The left ventricle has no regional wall motion abnormalities. There is mild concentric left ventricular hypertrophy. Left ventricular diastolic  parameters are consistent with Grade I diastolic dysfunction (impaired  relaxation).  2. Right ventricular systolic function is hyperdynamic. The right ventricular size is normal. There is normal pulmonary artery systolic pressure.  3. Left atrial size was mildly dilated.  4. The mitral valve is normal in structure. No evidence of mitral valve regurgitation. No evidence of mitral stenosis.  5. The aortic valve is tricuspid. Aortic valve regurgitation is not visualized. No aortic stenosis is present.  6. The inferior vena cava is normal in size with greater than 50% respiratory variability, suggesting right atrial pressure of 3 mmHg.  Comparison(s): No significant change from prior study.        Alcoholic cirrhosis liver disease MELD 3.0: 25 at 12/26/2022  3:10 PM   Hepatic Function Panel     Component Value Date/Time   PROT 5.9 (L) 12/26/2022 1135   PROT 6.6 08/17/2022 1113   ALBUMIN 2.9 (L) 12/26/2022 1135   ALBUMIN 3.4 (L) 08/17/2022 1113   AST 31 12/26/2022 1135   AST 50 (H) 03/08/2021 1439   ALT 18 12/26/2022 1135   ALT 19 03/08/2021 1439   ALKPHOS 109 12/26/2022 1135   BILITOT 4.4 (H) 12/26/2022 1135   BILITOT 4.4 (H) 08/17/2022 1113   BILITOT 2.6 (H) 03/08/2021 1439   BILIDIR 2.0 (H) 11/05/2021 1314   IBILI 4.1 (H) 11/05/2021 1314   INR 1.8      Chronic anemia - baseline hg  Hemoglobin & Hematocrit  Recent Labs    11/15/22 1309 11/28/22 0857 12/26/22 1135  HGB 10.0* 9.7* 7.7*   Iron/TIBC/Ferritin/ %Sat    Component Value Date/Time   IRON 18 (L) 06/17/2021 1852   IRON 15 (L) 12/19/2019 1456   TIBC 185 (L) 06/17/2021 1852   TIBC 204 (L) 12/19/2019 1456   FERRITIN 14 (L) 06/17/2021 1852   FERRITIN 24 (L) 12/19/2019 1456   IRONPCTSAT 10 (L) 06/17/2021 1852   IRONPCTSAT 7 (LL) 12/19/2019 1456      While in ER: Clinical Course as of 12/26/22 2301  Mon Dec 26, 2022  1433 Sodium(!): 124 131- 121 over last month  [SG]  1433 Hemoglobin(!): 7.7 9.7 four wks ago; ~10 baseline [SG]  1612 Abdominal pain since 12/11  [CC]  1613  INR(!): 1.8 Stable  [CC]  1614 Worsening large ascities [CC]  1614 HX large volume ascitis [CC]    Clinical Course User Index [CC] [name], MD [SG] Tanda Rockers A, DO       Lab Orders         Lipase, blood         Comprehensive metabolic panel         CBC         Urinalysis, Routine w reflex microscopic -Urine, Clean Catch         Protime-INR        CTabd/pelvis -1. Very large amount of ascites throughout the abdomen and pelvis which has increased from prior. 2. Small amount of layering hyperdensity in the right paracolic gutter which was not seen on the prior exam. Findings may represent small amount of hemorrhage. 3. 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 4. Cholelithiasis. 5. Diffuse body wall edema. 6. Umbilical hernia containing ascites has increased in size.    Following Medications were ordered in ER: Medications  fentaNYL (SUBLIMAZE) injection 50 mcg (50 mcg Intravenous Given 12/26/22 1701)  iohexol (OMNIPAQUE) 350 MG/ML injection 75 mL (75 mLs Intravenous Contrast Given 12/26/22 1845)    _________     ED Triage Vitals  Encounter Vitals Group     BP 12/26/22 1052 128/79     Systolic BP Percentile --      Diastolic BP Percentile --      Pulse Rate 12/26/22 1052 97     Resp 12/26/22 1052 16     Temp 12/26/22 1052 98.2 F (36.8 C)     Temp Source 12/26/22 1444 Oral     SpO2 12/26/22 1052 100 %     Weight --      Height --      Head Circumference --      Peak Flow --      Pain Score 12/26/22 1132 9     Pain Loc --      Pain Education --      Exclude from Growth Chart --   IONG(29)@     _________________________________________ Significant initial  Findings: Abnormal Labs Reviewed  LIPASE, BLOOD - Abnormal; Notable for the following components:      Result Value   Lipase 61 (*)    All other components within normal limits  COMPREHENSIVE METABOLIC PANEL - Abnormal; Notable for the  following components:   Sodium 124 (*)    CO2 16 (*)    Glucose, Bld 112 (*)    Calcium 8.3 (*)    Total Protein 5.9 (*)    Albumin 2.9 (*)  Total Bilirubin 4.4 (*)    All other components within normal limits  CBC - Abnormal; Notable for the following components:   WBC 3.9 (*)    RBC 2.13 (*)    Hemoglobin 7.7 (*)    HCT 22.5 (*)    MCV 105.6 (*)    MCH 36.2 (*)    Platelets 75 (*)    All other components within normal limits  URINALYSIS, ROUTINE W REFLEX MICROSCOPIC - Abnormal; Notable for the following components:   Color, Urine AMBER (*)    Hgb urine dipstick MODERATE (*)    All other components within normal limits  PROTIME-INR - Abnormal; Notable for the following components:   Prothrombin Time 20.7 (*)    INR 1.8 (*)    All other components within normal limits        ECG: Ordered    BNP (last 3 results) Recent Labs    02/28/22 1240  BNP 34.3     COVID-19 Labs  No results for input(s): "DDIMER", "FERRITIN", "LDH", "CRP" in the last 72 hours.  Lab Results  Component Value Date   SARSCOV2NAA NEGATIVE 02/23/2021   SARSCOV2NAA NEGATIVE 04/02/2020     The recent clinical data is shown below. Vitals:   12/26/22 1857 12/26/22 1900 12/26/22 2200 12/26/22 2259  BP: (!) 105/59 113/70 113/65   Pulse: 94 90 92   Resp: 15 13 12    Temp: 97.9 F (36.6 C)   98.2 F (36.8 C)  TempSrc: Oral   Oral  SpO2: 100% 100% 100%     WBC     Component Value Date/Time   WBC 3.9 (L) 12/26/2022 1135   LYMPHSABS 0.6 (L) 11/28/2022 0857   LYMPHSABS 0.4 (L) 08/17/2022 1113   MONOABS 0.3 11/28/2022 0857   EOSABS 0.0 11/28/2022 0857   EOSABS 0.0 08/17/2022 1113   BASOSABS 0.0 11/28/2022 0857   BASOSABS 0.0 08/17/2022 1113     Procalcitonin   Ordered      UA   no evidence of UTI    Urine analysis:    Component Value Date/Time   COLORURINE AMBER (A) 12/26/2022 2056   APPEARANCEUR CLEAR 12/26/2022 2056   LABSPEC 1.027 12/26/2022 2056   PHURINE 6.0 12/26/2022 2056    GLUCOSEU NEGATIVE 12/26/2022 2056   HGBUR MODERATE (A) 12/26/2022 2056   BILIRUBINUR NEGATIVE 12/26/2022 2056   KETONESUR NEGATIVE 12/26/2022 2056   PROTEINUR NEGATIVE 12/26/2022 2056   UROBILINOGEN 0.2 08/02/2014 2045   NITRITE NEGATIVE 12/26/2022 2056   LEUKOCYTESUR NEGATIVE 12/26/2022 2056    Results for orders placed or performed during the hospital encounter of 11/28/22  Body fluid culture w Gram Stain     Status: None   Collection Time: 11/29/22 10:00 AM   Specimen: PATH Cytology Peritoneal fluid  Result Value Ref Range Status   Specimen Description   Final    PERITONEAL Performed at [facility], [address], [place], [place] [address]    Special Requests   Final    NONE Performed at [facility], [address], [place], [place] [address]    Gram Stain   Final    WBC PRESENT, PREDOMINANTLY MONONUCLEAR NO ORGANISMS SEEN CYTOSPIN SMEAR    Culture   Final    NO GROWTH 3 DAYS Performed at [facility], [address], [place], [place] [address]    Report Status 12/02/2022 FINAL  Final    ________________________________________________________________     __________________________________________ Recent Labs  Lab  12/26/22 1135  NA 124*  K 4.6  CO2 16*  GLUCOSE 112*  BUN 14  CREATININE 0.72  CALCIUM 8.3*    Cr   stable,  Lab Results  Component Value Date   CREATININE 0.72 12/26/2022   CREATININE 0.83 12/12/2022   CREATININE 0.75 (L) 12/05/2022    Recent Labs  Lab 12/26/22 1135  AST 31  ALT 18  ALKPHOS 109  BILITOT 4.4*  PROT 5.9*  ALBUMIN 2.9*   Lab Results  Component Value Date   CALCIUM 8.3 (L) 12/26/2022   PHOS 3.6 09/25/2022       Plt: Lab Results  Component Value Date   PLT 75 (L) 12/26/2022         Recent Labs  Lab 12/26/22 1135  WBC 3.9*  HGB 7.7*  HCT 22.5*  MCV 105.6*  PLT 75*    HG/HCT   stable,      Component Value Date/Time   HGB 7.7 (L) 12/26/2022 1135    HGB 9.8 (L) 08/17/2022 1113   HCT 22.5 (L) 12/26/2022 1135   HCT 26.9 (L) 08/17/2022 1113   MCV 105.6 (H) 12/26/2022 1135   MCV 97 08/17/2022 1113    Recent Labs  Lab 12/26/22 1135  LIPASE 61*     ____________________________________ Hospitalist was called for admission for ascites  The following Work up has been ordered so far:  Orders Placed This Encounter  Procedures   CT ABDOMEN PELVIS W CONTRAST   Lipase, blood   Comprehensive metabolic panel   CBC   Urinalysis, Routine w reflex microscopic -Urine, Clean Catch   Protime-INR   Diet NPO time specified   Consult to hospitalist   Type and screen [facility]     OTHER Significant initial  Findings:  labs showing:     DM  labs:  HbA1C: No results for input(s): "HGBA1C" in the last 8760 hours.     CBG (last 3)  No results for input(s): "GLUCAP" in the last 72 hours.        Cultures:    Component Value Date/Time   SDES  11/29/2022 1000    PERITONEAL Performed at [facility], [address] [place], [place] [address]    SPECREQUEST  11/29/2022 1000    NONE Performed at [facility], [address], [place], [place] [address]    CULT  11/29/2022 1000    NO GROWTH 3 DAYS Performed at [facility], [address], [place], [place] [address]    REPTSTATUS 12/02/2022 FINAL 11/29/2022 1000     Radiological Exams on Admission: CT ABDOMEN PELVIS W CONTRAST Result Date: 12/26/2022 CLINICAL DATA:  Acute abdominal pain.  Cirrhosis. EXAM: CT ABDOMEN AND PELVIS WITH CONTRAST TECHNIQUE: Multidetector CT imaging of the abdomen and pelvis was performed using the standard protocol following bolus administration of intravenous contrast. RADIATION DOSE REDUCTION: This exam was performed according to the departmental dose-optimization program which includes automated exposure control, adjustment of the mA and/or kV according to patient size and/or use of iterative  reconstruction technique. CONTRAST:  75mL OMNIPAQUE IOHEXOL 350 MG/ML SOLN COMPARISON:  CT abdomen and pelvis 09/26/2022 FINDINGS: Lower chest: There is atelectasis in the lung bases. Hepatobiliary: The liver is small in size with nodular liver contour compatible with cirrhosis. Gallstones are seen. There is no biliary ductal dilatation. Pancreas: Unremarkable. No pancreatic ductal dilatation or surrounding inflammatory changes. Spleen: Mildly enlarged, unchanged. Adrenals/Urinary Tract: Adrenal glands are unremarkable. Kidneys are normal,  without renal calculi, focal lesion, or hydronephrosis. Bladder is unremarkable. Stomach/Bowel: Stomach is within normal limits. Appendix appears normal. No evidence of bowel wall thickening, distention, or inflammatory changes. Appendix is not seen. Vascular/Lymphatic: Aorta and IVC are normal in size. Portal vein appears patent. Varices are seen in the right abdomen. No enlarged lymph nodes are identified. Reproductive: Prostate is unremarkable. Scrotal wall edema versus small hydroceles. Other: There is a very large amount of ascites throughout the abdomen and pelvis which has increased from prior. There is an umbilical hernia containing ascites which has increased in size. There is diffuse body wall edema. There is a small amount of layering hyperdensity in the right paracolic gutter image 3/58 which was not seen on the prior exam. No free intraperitoneal air. Musculoskeletal: No acute or significant osseous findings. IMPRESSION: 1. Very large amount of ascites throughout the abdomen and pelvis which has increased from prior. 2. Small amount of layering hyperdensity in the right paracolic gutter which was not seen on the prior exam. Findings may represent small amount of hemorrhage. 3. 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 4. Cholelithiasis. 5. Diffuse body wall edema. 6. Umbilical hernia containing ascites has increased in  size. Electronically Signed   By: Darliss Cheney M.D.   On: 12/26/2022 20:43   _______________________________________________________________________________________________________ Latest  Blood pressure 113/65, pulse 92, temperature 98.2 F (36.8 C), temperature source Oral, resp. rate 12, SpO2 100%.   Vitals  labs and radiology finding personally reviewed  Review of Systems:    Pertinent positives include:  fatigue anasarca,  Bilateral lower extremity swelling  abdominal pain, Constitutional:  No weight loss, night sweats, Fevers, chills, , weight loss  HEENT:  No headaches, Difficulty swallowing,Tooth/dental problems,Sore throat,  No sneezing, itching, ear ache, nasal congestion, post nasal drip,  Cardio-vascular:  No chest pain, Orthopnea, PND,dizziness, palpitations.no GI:  No heartburn, indigestion,  nausea, vomiting, diarrhea, change in bowel habits, loss of appetite, melena, blood in stool, hematemesis Resp:  no shortness of breath at rest. No dyspnea on exertion, No excess mucus, no productive cough, No non-productive cough, No coughing up of blood.No change in color of mucus.No wheezing. Skin:  no rash or lesions. No jaundice GU:  no dysuria, change in color of urine, no urgency or frequency. No straining to urinate.  No flank pain.  Musculoskeletal:  No joint pain or no joint swelling. No decreased range of motion. No back pain.  Psych:  No change in mood or affect. No depression or anxiety. No memory loss.  Neuro: no localizing neurological complaints, no tingling, no weakness, no double vision, no gait abnormality, no slurred speech, no confusion  All systems reviewed and apart from HOPI all are negative _______________________________________________________________________________________________ Past Medical History:   Past Medical History:  Diagnosis Date   Acute metabolic encephalopathy 06/20/2021   Alcohol withdrawal seizure (HCC)    Cirrhosis (HCC)     ETOH abuse    Pneumonia 04/02/2020   SBP (spontaneous bacterial peritonitis) (HCC) 06/17/2021      Past Surgical History:  Procedure Laterality Date   COLONOSCOPY WITH PROPOFOL N/A 03/17/2018   Procedure: COLONOSCOPY WITH PROPOFOL;  Surgeon: Bernette Redbird, MD;  Location: [facility] ENDOSCOPY;  Service: Endoscopy;  Laterality: N/A;   ESOPHAGOGASTRODUODENOSCOPY (EGD) WITH PROPOFOL N/A 03/17/2018   Procedure: ESOPHAGOGASTRODUODENOSCOPY (EGD) WITH PROPOFOL;  Surgeon: Bernette Redbird, MD;  Location: [facility] ENDOSCOPY;  Service: Endoscopy;  Laterality: N/A;   IR PARACENTESIS  03/14/2018   IR PARACENTESIS  11/18/2020  IR PARACENTESIS  06/17/2021   IR PARACENTESIS  08/30/2021   IR PARACENTESIS  09/02/2021   IR PARACENTESIS  11/16/2021   IR PARACENTESIS  03/01/2022   IR PARACENTESIS  05/31/2022   IR PARACENTESIS  06/02/2022   IR PARACENTESIS  06/23/2022   IR PARACENTESIS  07/01/2022   IR PARACENTESIS  08/18/2022   IR PARACENTESIS  09/26/2022   IR PARACENTESIS  11/07/2022   IR PARACENTESIS  12/07/2022   IR PARACENTESIS  12/21/2022    Social History:  Ambulatory   independently      reports that he has been smoking cigarettes. He started smoking about 3 years ago. He has a 0.8 pack-year smoking history. He has never used smokeless tobacco. He reports current alcohol use of about 2.0 standard drinks of alcohol per week. He reports that he does not use drugs.     Family History:   Family History  Adopted: Yes  Problem Relation Age of Onset   Liver disease Neg Hx    Esophageal cancer Neg Hx    Colon cancer Neg Hx    ______________________________________________________________________________________________ Allergies: No Known Allergies   Prior to Admission medications   Medication Sig Start Date End Date Taking? Authorizing Provider  lactulose (CHRONULAC) 10 GM/15ML solution Take 45 mLs (30 g total) by mouth 2 (two) times daily. Patient taking differently: Take 30 g by mouth daily. 12/05/22  Yes Hoy Register, MD  ferrous sulfate 325 (65 FE) MG tablet Take 1 tablet (325 mg total) by mouth daily with breakfast. 08/17/22   [name], MD  folic acid (FOLVITE) 1 MG tablet Take 1 tablet (1 mg total) by mouth daily. 08/17/22   [name], MD  furosemide (LASIX) 20 MG tablet Take 1 tablet (20 mg total) by mouth daily. 12/05/22   Hoy Register, MD  midodrine (PROAMATINE) 10 MG tablet Take 1 tablet (10 mg total) by mouth 3 (three) times daily with meals. 12/05/22   Hoy Register, MD  spironolactone (ALDACTONE) 50 MG tablet Take 1 tablet (50 mg total) by mouth daily. 12/05/22   Hoy Register, MD  sulfamethoxazole-trimethoprim (BACTRIM DS) 800-160 MG tablet Take 1 tablet by mouth daily. 11/22/22   Benancio Deeds, MD    ___________________________________________________________________________________________________ Physical Exam:    12/26/2022   10:00 PM 12/26/2022    7:00 PM 12/26/2022    6:57 PM  Vitals with BMI  Systolic 113 113 454  Diastolic 65 70 59  Pulse 92 90 94     1. General:  in No  Acute distress   Chronically ill   -appearing 2. Psychological: Alert and   Oriented 3. Head/ENT:   Moist  Mucous Membranes                          Head Non traumatic, neck supple                         Poor Dentition 4. SKIN:   decreased Skin turgor,  Skin clean Dry and intact no rash    5. Heart: Regular rate and rhythm no  Murmur, no Rub or gallop 6. Lungs:  Clear to auscultation bilaterally, no wheezes or crackles   7. Abdomen: Soft,  non-tender,  distended bowel sounds decreased 8. Lower extremities: no clubbing, cyanosis, no  edema 9. Neurologically Grossly intact, moving all 4 extremities equally   10. MSK: Normal range of motion    Chart  has been reviewed  ______________________________________________________________________________________________  Assessment/Plan  42 y.o. male with medical history significant of alcohol induced cirrhosis,  ascites Pancytopenia, chronic diastolic grade 1 CHF  Admitted for ascites   Present on Admission:  Ascites  Alcohol use disorder  Alcoholic cirrhosis of liver with ascites (HCC)  Macrocytic anemia  Hyponatremia  Tobacco abuse     Alcohol use disorder Monitor for any sign of withdrawal repots only had one beer on Friday  Alcoholic cirrhosis of liver with ascites (HCC) Will need IR consult for paracentesis tomorrow Labs and albumin ordered  Macrocytic anemia Obtain anemia panel  Transfuse for Hg <7 , rapidly dropping or  if symptomatic   Hyponatremia In the setting of cirrhosis  Tobacco abuse  - Spoke about importance of quitting spent 5 minutes discussing options for treatment, prior attempts at quitting, and dangers of smoking  -At this point patient is    interested in quitting  - order nicotine patch   - nursing tobacco cessation protocol    Other plan as per orders.  DVT prophylaxis:  SCD      Code Status:    Code Status: Prior FULL CODE   as per patient   I had personally discussed CODE STATUS with patient  ACP   none    Family Communication:   Family not at  Bedside    Diet  Diet Orders (From admission, onward)     Start     Ordered   12/26/22 1135  Diet NPO time specified  Diet effective now        12/26/22 1134            Disposition Plan:     To home once workup is complete and patient is stable   Following barriers for discharge:                                                         Electrolytes corrected                                                          Will need consultants to evaluate patient prior to discharge       Consult Orders  (From admission, onward)           Start     Ordered   12/26/22 2240  Consult to hospitalist  Paged By [facility]  Once       Provider:  (Not yet assigned)  Question Answer Comment  Place call to: [facility]   Reason for Consult Admit      12/26/22 2239                                Consults called: IR    Admission status:  ED Disposition     ED Disposition  Admit   Condition  --   Comment  Hospital Area: [facility] [[id number]]  Level of Care: Telemetry Medical [[id number]]  May place patient in observation at [facility] or [facility] if equivalent level of care is available:: No  Covid Evaluation: Asymptomatic - no recent exposure (last 10 days) testing not required  Diagnosis: Ascites [811914]  Admitting Physician: Therisa Doyne [[id number]]  Attending Physician: Therisa Doyne [[id number]]           Obs      Level of care     tele  For  24H       Lab Results  Component Value Date   SARSCOV2NAA NEGATIVE 02/23/2021     Feather Berrie 12/26/2022, 11:45 PM    [facility]     after 2 AM please page floor coverage PA If 7AM-7PM, please contact the day team taking care of the patient using [email or web address]

## 2022-12-26 NOTE — ED Provider Notes (Signed)
[place] EMERGENCY DEPARTMENT AT [facility] Provider Note   CSN: [id number] Arrival date & time: 12/26/22  1047     History Chief Complaint  Patient presents with   Abdominal Pain    HPI Mathew [name] is a 42 y.o. male presenting for chief complaint of abdominal pain, distention and swelling.  He is a 42 year old male history of ESLD with frequent recurrent ascites.  Recently admitted 5 days ago for 14 L of ascitic fluid drainage.  Rapidly is reaccumulated in the outpatient setting today he feels significant abdominal pain due to the degree of distention. Denies fevers chills nausea vomiting syncope shortness of breath..   Patient's recorded medical, surgical, social, medication list and allergies were reviewed in the Snapshot window as part of the initial history.   Review of Systems   Review of Systems  Constitutional:  Positive for fatigue. Negative for chills and fever.  HENT:  Negative for ear pain and sore throat.   Eyes:  Negative for pain and visual disturbance.  Respiratory:  Negative for cough and shortness of breath.   Cardiovascular:  Negative for chest pain and palpitations.  Gastrointestinal:  Positive for abdominal distention and abdominal pain. Negative for vomiting.  Genitourinary:  Negative for dysuria and hematuria.  Musculoskeletal:  Negative for arthralgias and back pain.  Skin:  Negative for color change and rash.  Neurological:  Positive for weakness. Negative for seizures and syncope.  All other systems reviewed and are negative.   Physical Exam Updated Vital Signs BP 113/65   Pulse 92   Temp 98.2 F (36.8 C) (Oral)   Resp 12   SpO2 100%  Physical Exam Vitals and nursing note reviewed.  Constitutional:      General: He is not in acute distress.    Appearance: He is well-developed.  HENT:     Head: Normocephalic and atraumatic.  Eyes:     Conjunctiva/sclera: Conjunctivae normal.  Cardiovascular:     Rate and  Rhythm: Normal rate and regular rhythm.     Heart sounds: No murmur heard. Pulmonary:     Effort: Pulmonary effort is normal. No respiratory distress.     Breath sounds: Normal breath sounds.  Abdominal:     Palpations: Abdomen is soft. There is shifting dullness, fluid wave and hepatomegaly.     Tenderness: There is generalized abdominal tenderness. There is no right CVA tenderness or guarding.  Musculoskeletal:        General: No swelling.     Cervical back: Neck supple.  Skin:    General: Skin is warm and dry.     Capillary Refill: Capillary refill takes less than 2 seconds.  Neurological:     Mental Status: He is alert.  Psychiatric:        Mood and Affect: Mood normal.      ED Course/ Medical Decision Making/ A&P Clinical Course as of 12/26/22 2321  Mon Dec 26, 2022  1433 Sodium(!): 124 131- 121 over last month  [SG]  1433 Hemoglobin(!): 7.7 9.7 four wks ago; ~10 baseline [SG]  1612 Abdominal pain since 12/11  [CC]  1613 INR(!): 1.8 Stable  [CC]  1614 Worsening large ascities [CC]  1614 HX large volume ascitis [CC]    Clinical Course User Index [CC] [name], MD [SG] Sloan Leiter, DO    Procedures Procedures   Medications Ordered in ED Medications  albumin human 25 % solution 12.5 g (has no administration in time range)  fentaNYL (SUBLIMAZE)  injection 50 mcg (50 mcg Intravenous Given 12/26/22 1701)  iohexol (OMNIPAQUE) 350 MG/ML injection 75 mL (75 mLs Intravenous Contrast Given 12/26/22 1845)   Medical Decision Making:   Mathew [name] is a 42 y.o. male who presented to the ED today with abdominal pain, detailed above.    Patient placed on continuous vitals and telemetry monitoring while in ED which was reviewed periodically.  Complete initial physical exam performed, notably the patient  was HDS in NAD.     Reviewed and confirmed nursing documentation for past medical history, family history, social history.    Initial  Assessment:   With the patient's presentation of abdominal pain, most likely diagnosis is nonspecific etiology. Other diagnoses were considered including (but not limited to) gastroenteritis, colitis, small bowel obstruction, appendicitis, cholecystitis, pancreatitis, nephrolithiasis, UTI, pyleonephritis These are considered less likely due to history of present illness and physical exam findings.   This is most consistent with an acute life/limb threatening illness complicated by underlying chronic conditions.   Initial Plan:  CBC/CMP to evaluate for underlying infectious/metabolic etiology for patient's abdominal pain  Lipase to evaluate for pancreatitis  EKG to evaluate for cardiac source of pain  CTAB/Pelvis with contrast to evaluate for structural/surgical etiology of patients' severe abdominal pain.  Urinalysis and repeat physical assessment to evaluate for UTI/Pyelonpehritis  Empiric management of symptoms with escalating pain control and antiemetics as needed.   Initial Study Results:   Laboratory  Consistent with known liver disease EKG EKG was reviewed independently. Rate, rhythm, axis, intervals all examined and without medically relevant abnormality. ST segments without concerns for elevations.    Radiology All images reviewed independently. Agree with radiology report at this time.   CT ABDOMEN PELVIS W CONTRAST Result Date: 12/26/2022 CLINICAL DATA:  Acute abdominal pain.  Cirrhosis. EXAM: CT ABDOMEN AND PELVIS WITH CONTRAST TECHNIQUE: Multidetector CT imaging of the abdomen and pelvis was performed using the standard protocol following bolus administration of intravenous contrast. RADIATION DOSE REDUCTION: This exam was performed according to the departmental dose-optimization program which includes automated exposure control, adjustment of the mA and/or kV according to patient size and/or use of iterative reconstruction technique. CONTRAST:  75mL OMNIPAQUE IOHEXOL 350 MG/ML SOLN  COMPARISON:  CT abdomen and pelvis 09/26/2022 FINDINGS: Lower chest: There is atelectasis in the lung bases. Hepatobiliary: The liver is small in size with nodular liver contour compatible with cirrhosis. Gallstones are seen. There is no biliary ductal dilatation. Pancreas: Unremarkable. No pancreatic ductal dilatation or surrounding inflammatory changes. Spleen: Mildly enlarged, unchanged. Adrenals/Urinary Tract: Adrenal glands are unremarkable. Kidneys are normal, without renal calculi, focal lesion, or hydronephrosis. Bladder is unremarkable. Stomach/Bowel: Stomach is within normal limits. Appendix appears normal. No evidence of bowel wall thickening, distention, or inflammatory changes. Appendix is not seen. Vascular/Lymphatic: Aorta and IVC are normal in size. Portal vein appears patent. Varices are seen in the right abdomen. No enlarged lymph nodes are identified. Reproductive: Prostate is unremarkable. Scrotal wall edema versus small hydroceles. Other: There is a very large amount of ascites throughout the abdomen and pelvis which has increased from prior. There is an umbilical hernia containing ascites which has increased in size. There is diffuse body wall edema. There is a small amount of layering hyperdensity in the right paracolic gutter image 3/58 which was not seen on the prior exam. No free intraperitoneal air. Musculoskeletal: No acute or significant osseous findings. IMPRESSION: 1. Very large amount of ascites throughout the abdomen and pelvis which has increased from prior. 2.  Small amount of layering hyperdensity in the right paracolic gutter which was not seen on the prior exam. Findings may represent small amount of hemorrhage. 3. 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 4. Cholelithiasis. 5. Diffuse body wall edema. 6. Umbilical hernia containing ascites has increased in size. Electronically Signed   By: Darliss Cheney M.D.   On: 12/26/2022 20:43    IR Paracentesis Result Date: 12/21/2022 INDICATION: Alcoholic cirrhosis with recurrent ascites EXAM: ULTRASOUND GUIDED LEFT SIDED PARACENTESIS MEDICATIONS: 10 mL 1% lidocaine COMPLICATIONS: None immediate. PROCEDURE: Informed written consent was obtained from the patient after a discussion of the risks, benefits and alternatives to treatment. A timeout was performed prior to the initiation of the procedure. Initial ultrasound scanning demonstrates a large amount of ascites within the left lower abdominal quadrant. The left lower abdomen was prepped and draped in the usual sterile fashion. 1% lidocaine was used for local anesthesia. Following this, a 19 gauge, 7-cm, Yueh catheter was introduced. An ultrasound image was saved for documentation purposes. The paracentesis was performed. The catheter was removed and a dressing was applied. The patient tolerated the procedure well without immediate post procedural complication. Patient received post-procedure intravenous albumin; see nursing notes for details. FINDINGS: A total of approximately 5 L of clear, yellow  fluid was removed. IMPRESSION: Successful ultrasound-guided paracentesis yielding 5L liters of peritoneal fluid. PLAN: The patient has previously been formally evaluated by the [facility] and is being actively followed for potential future intervention. Procedure performed by Philipp Ovens PA-C Electronically Signed   By: Simonne Come M.D.   On: 12/21/2022 16:30   IR Paracentesis Result Date: 12/07/2022 INDICATION: 42 year old male with history of alcoholic cirrhosis and recurrent ascites. Request for therapeutic paracentesis. EXAM: ULTRASOUND GUIDED  PARACENTESIS MEDICATIONS: 10 mL 1% lidocaine COMPLICATIONS: None immediate. PROCEDURE: Informed written consent was obtained from the patient after a discussion of the risks, benefits and alternatives to treatment. A timeout was performed prior to  the initiation of the procedure. Initial ultrasound scanning demonstrates a large amount of ascites within the right lower abdominal quadrant. The right lower abdomen was prepped and draped in the usual sterile fashion. 1% lidocaine was used for local anesthesia. Following this, a 19 gauge, 7-cm, Yueh catheter was introduced. An ultrasound image was saved for documentation purposes. The paracentesis was performed. The catheter was removed and a dressing was applied. The patient tolerated the procedure well without immediate post procedural complication. Patient received post-procedure intravenous albumin; see nursing notes for details. FINDINGS: A total of approximately 10 L of hazy yellow fluid was removed. IMPRESSION: Successful ultrasound-guided paracentesis yielding 10 liters of peritoneal fluid. PLAN: The patient has previously been formally evaluated by the [facility] Radiology [facility]pert[facility] and is being actively followed for potential future intervention. Performed by: Lawernce Ion, PA-C Electronically Signed   By: Gilmer Mor D.O.   On: 12/07/2022 16:19   US Paracentesis Result Date: 11/29/2022 INDICATION: Patient with history of alcoholic cirrhosis, prior spontaneous bacterial peritonitis, recurrent ascites. Request received for diagnostic and therapeutic paracentesis. EXAM: ULTRASOUND GUIDED DIAGNOSTIC AND THERAPEUTIC PARACENTESIS MEDICATIONS: 8 ml 1% lidocaine COMPLICATIONS: None immediate. PROCEDURE: Informed written consent was obtained from the patient via interpreter after a discussion of the risks, benefits and alternatives to treatment. A timeout was performed prior to the initiation of the procedure. Initial ultrasound scanning demonstrates a large amount of ascites within the right lower abdominal quadrant. The right lower abdomen was prepped  and draped in the usual sterile fashion. 1% lidocaine was used for local anesthesia. Following this, a 19 gauge, 7-cm, Yueh  catheter was introduced. An ultrasound image was saved for documentation purposes. The paracentesis was performed. The catheter was removed and a dressing was applied. The patient tolerated the procedure well without immediate post procedural complication. Patient received post-procedure intravenous albumin; see nursing notes for details. FINDINGS: A total of approximately 11.5 liters of yellow fluid was removed. Samples were sent to the laboratory as requested by the clinical team. IMPRESSION: Successful ultrasound-guided diagnostic and therapeutic paracentesis yielding 11.5 liters of peritoneal fluid. PLAN: The patient has previously been formally evaluated by the [facility] Interventional Radiology Portal Hypertension [facility] and is being actively followed for potential future intervention. Performed by: Jannette Spanner Electronically Signed   By: Marliss Coots M.D.   On: 11/29/2022 10:59  Final Reassessment and Plan:   Very large amount of ascitic fluid appreciated on CT.  Consulted medicine.  They agree with admission for recurrent paracentesis with albumin replacement if needed.  No evidence of SBP on lab work or current exam. Small volume hemorrhage seen on CT is nonspecific may be from recent paracentesis and low INR though acute hemorrhage is considered possible though less likely based on the presentation at this time as patient is red and relatively no acute distress. Ascitic fluid drainage tomorrow may provide further differentiation.  Disposition:   Based on the above findings, I believe this patient is stable for admission.    Patient/family educated about specific findings on our evaluation and explained exact reasons for admission.  Patient/family educated about clinical situation and time was allowed to answer questions.   Admission team communicated with and agreed with need for admission. Patient admitted. Patient ready to move at this time.     Emergency Department  Medication Summary:   Medications  albumin human 25 % solution 12.5 g (has no administration in time range)  fentaNYL (SUBLIMAZE) injection 50 mcg (50 mcg Intravenous Given 12/26/22 1701)  iohexol (OMNIPAQUE) 350 MG/ML injection 75 mL (75 mLs Intravenous Contrast Given 12/26/22 1845)          Clinical Impression:  1. Cirrhosis of liver with ascites, unspecified hepatic cirrhosis type (HCC)      Admit   Final Clinical Impression(s) / ED Diagnoses Final diagnoses:  Cirrhosis of liver with ascites, unspecified hepatic cirrhosis type [facility])    Rx / DC Orders ED Discharge Orders     None         [name], MD 12/26/22 2321

## 2022-12-26 NOTE — ED Notes (Signed)
PT returned from CT

## 2022-12-26 NOTE — ED Notes (Signed)
Patient transported to CT 

## 2022-12-26 NOTE — Subjective & Objective (Signed)
Patient with history of alcoholic liver cirrhosis requiring paracentesis continues to drink Presents with abdominal pain Last paracentesis was done on 11 December 5 L of clear yellow fluid was taking by IR Patient here with abdominal distention and swelling which is rapidly reaccumulating Now having abdominal pain due to distention no fevers no chills no nausea no vomiting

## 2022-12-26 NOTE — ED Notes (Signed)
ED TO INPATIENT HANDOFF REPORT  ED Nurse Name and Phone #: Gillis Ends #[phone]  S Name/Age/Gender Mathew [name] 42 y.o. male Room/Bed: 011C/011C  Code Status   Code Status: Full Code  Home/SNF/Other Home Patient oriented to: self, place, time, and situation Is this baseline? Yes   Triage Complete: Triage complete  Chief Complaint Ascites [R18.8]  Triage Note Pt c.o abd pain, hx of liver cirrhosis with paracentesis. States he had 1 beer on friday   Allergies No Known Allergies  Level of Care/Admitting Diagnosis ED Disposition     ED Disposition  Admit   Condition  --   Comment  Hospital Area: [facility] [[id number]]  Level of Care: Telemetry Medical [104]  May place patient in observation at [facility] or [facility] if equivalent level of care is available:: No  Covid Evaluation: Asymptomatic - no recent exposure (last 10 days) testing not required  Diagnosis: Ascites [528413]  Admitting Physician: Therisa Doyne [[id number]]  Attending Physician: Therisa Doyne [[id number]]          B Medical/Surgery History Past Medical History:  Diagnosis Date   Acute metabolic encephalopathy 06/20/2021   Alcohol withdrawal seizure (HCC)    Cirrhosis (HCC)    ETOH abuse    Pneumonia 04/02/2020   SBP (spontaneous bacterial peritonitis) (HCC) 06/17/2021   Past Surgical History:  Procedure Laterality Date   COLONOSCOPY WITH PROPOFOL N/A 03/17/2018   Procedure: COLONOSCOPY WITH PROPOFOL;  Surgeon: Bernette Redbird, MD;  Location: [facility] ENDOSCOPY;  Service: Endoscopy;  Laterality: N/A;   ESOPHAGOGASTRODUODENOSCOPY (EGD) WITH PROPOFOL N/A 03/17/2018   Procedure: ESOPHAGOGASTRODUODENOSCOPY (EGD) WITH PROPOFOL;  Surgeon: Bernette Redbird, MD;  Location: [facility] ENDOSCOPY;  Service: Endoscopy;  Laterality: N/A;   IR PARACENTESIS  03/14/2018   IR PARACENTESIS  11/18/2020   IR PARACENTESIS  06/17/2021   IR PARACENTESIS  08/30/2021   IR PARACENTESIS  09/02/2021   IR  PARACENTESIS  11/16/2021   IR PARACENTESIS  03/01/2022   IR PARACENTESIS  05/31/2022   IR PARACENTESIS  06/02/2022   IR PARACENTESIS  06/23/2022   IR PARACENTESIS  07/01/2022   IR PARACENTESIS  08/18/2022   IR PARACENTESIS  09/26/2022   IR PARACENTESIS  11/07/2022   IR PARACENTESIS  12/07/2022   IR PARACENTESIS  12/21/2022     A IV Location/Drains/Wounds Patient Lines/Drains/Airways Status     Active Line/Drains/Airways     Name Placement date Placement time Site Days   Peripheral IV 12/26/22 20 G Left Antecubital 12/26/22  1528  Antecubital  less than 1            Intake/Output Last 24 hours No intake or output data in the 24 hours ending 12/26/22 2323  Labs/Imaging Results for orders placed or performed during the hospital encounter of 12/26/22 (from the past 48 hours)  Lipase, blood     Status: Abnormal   Collection Time: 12/26/22 11:35 AM  Result Value Ref Range   Lipase 61 (H) 11 - 51 U/L    Comment: Performed at [facility], [address] [place], [place] [address]  Comprehensive metabolic panel     Status: Abnormal   Collection Time: 12/26/22 11:35 AM  Result Value Ref Range   Sodium 124 (L) 135 - 145 mmol/L   Potassium 4.6 3.5 - 5.1 mmol/L   Chloride 100 98 - 111 mmol/L   CO2 16 (L) 22 - 32 mmol/L   Glucose, Bld 112 (H) 70 - 99 mg/dL    Comment: Glucose  reference range applies only to samples taken after fasting for at least 8 hours.   BUN 14 6 - 20 mg/dL   Creatinine, Ser 1.61 0.61 - 1.24 mg/dL   Calcium 8.3 (L) 8.9 - 10.3 mg/dL   Total Protein 5.9 (L) 6.5 - 8.1 g/dL   Albumin 2.9 (L) 3.5 - 5.0 g/dL   AST 31 15 - 41 U/L   ALT 18 0 - 44 U/L   Alkaline Phosphatase 109 38 - 126 U/L   Total Bilirubin 4.4 (H) <1.2 mg/dL   GFR, Estimated >09 >60 mL/min    Comment: (NOTE) Calculated using the CKD-EPI Creatinine Equation (2021)    Anion gap 8 5 - 15    Comment: Performed at [facility], [address] [place], [place] [address]  CBC     Status:  Abnormal   Collection Time: 12/26/22 11:35 AM  Result Value Ref Range   WBC 3.9 (L) 4.0 - 10.5 K/uL   RBC 2.13 (L) 4.22 - 5.81 MIL/uL   Hemoglobin 7.7 (L) 13.0 - 17.0 g/dL   HCT 81.1 (L) 91.4 - 78.2 %   MCV 105.6 (H) 80.0 - 100.0 fL   MCH 36.2 (H) 26.0 - 34.0 pg   MCHC 34.2 30.0 - 36.0 g/dL   RDW 95.6 21.3 - 08.6 %   Platelets 75 (L) 150 - 400 K/uL    Comment: Immature Platelet Fraction may be clinically indicated, consider ordering this additional test [id number] REPEATED TO VERIFY    nRBC 0.0 0.0 - 0.2 %    Comment: Performed at [facility], [address], [place], [place] [address]  Type and screen [facility]     Status: None   Collection Time: 12/26/22  3:10 PM  Result Value Ref Range   ABO/RH(D) O POS    Antibody Screen NEG    Sample Expiration      12/29/2022,2359 Performed at [facility], [address], [place], [place] [address]   Protime-INR     Status: Abnormal   Collection Time: 12/26/22  3:10 PM  Result Value Ref Range   Prothrombin Time 20.7 (H) 11.4 - 15.2 seconds   INR 1.8 (H) 0.8 - 1.2    Comment: (NOTE) INR goal varies based on device and disease states. Performed at [facility], [address] [place], [place] [address]   Urinalysis, Routine w reflex microscopic -Urine, Clean Catch     Status: Abnormal   Collection Time: 12/26/22  8:56 PM  Result Value Ref Range   Color, Urine AMBER (A) YELLOW    Comment: BIOCHEMICALS MAY BE AFFECTED BY COLOR   APPearance CLEAR CLEAR   Specific Gravity, Urine 1.027 1.005 - 1.030   pH 6.0 5.0 - 8.0   Glucose, UA NEGATIVE NEGATIVE mg/dL   Hgb urine dipstick MODERATE (A) NEGATIVE   Bilirubin Urine NEGATIVE NEGATIVE   Ketones, ur NEGATIVE NEGATIVE mg/dL   Protein, ur NEGATIVE NEGATIVE mg/dL   Nitrite NEGATIVE NEGATIVE   Leukocytes,Ua NEGATIVE NEGATIVE   RBC / HPF 0-5 0 - 5 RBC/hpf   WBC, UA 0-5 0 - 5 WBC/hpf   Bacteria, UA NONE SEEN NONE SEEN   Squamous Epithelial / HPF 0-5  0 - 5 /HPF   Mucus PRESENT    Hyaline Casts, UA PRESENT     Comment: Performed at [facility], [address], [place], [place] [address]   CT ABDOMEN PELVIS W CONTRAST Result  Date: 12/26/2022 CLINICAL DATA:  Acute abdominal pain.  Cirrhosis. EXAM: CT ABDOMEN AND PELVIS WITH CONTRAST TECHNIQUE: Multidetector CT imaging of the abdomen and pelvis was performed using the standard protocol following bolus administration of intravenous contrast. RADIATION DOSE REDUCTION: This exam was performed according to the departmental dose-optimization program which includes automated exposure control, adjustment of the mA and/or kV according to patient size and/or use of iterative reconstruction technique. CONTRAST:  75mL OMNIPAQUE IOHEXOL 350 MG/ML SOLN COMPARISON:  CT abdomen and pelvis 09/26/2022 FINDINGS: Lower chest: There is atelectasis in the lung bases. Hepatobiliary: The liver is small in size with nodular liver contour compatible with cirrhosis. Gallstones are seen. There is no biliary ductal dilatation. Pancreas: Unremarkable. No pancreatic ductal dilatation or surrounding inflammatory changes. Spleen: Mildly enlarged, unchanged. Adrenals/Urinary Tract: Adrenal glands are unremarkable. Kidneys are normal, without renal calculi, focal lesion, or hydronephrosis. Bladder is unremarkable. Stomach/Bowel: Stomach is within normal limits. Appendix appears normal. No evidence of bowel wall thickening, distention, or inflammatory changes. Appendix is not seen. Vascular/Lymphatic: Aorta and IVC are normal in size. Portal vein appears patent. Varices are seen in the right abdomen. No enlarged lymph nodes are identified. Reproductive: Prostate is unremarkable. Scrotal wall edema versus small hydroceles. Other: There is a very large amount of ascites throughout the abdomen and pelvis which has increased from prior. There is an umbilical hernia containing ascites which has increased in size. There is diffuse body wall  edema. There is a small amount of layering hyperdensity in the right paracolic gutter image 3/58 which was not seen on the prior exam. No free intraperitoneal air. Musculoskeletal: No acute or significant osseous findings. IMPRESSION: 1. Very large amount of ascites throughout the abdomen and pelvis which has increased from prior. 2. Small amount of layering hyperdensity in the right paracolic gutter which was not seen on the prior exam. Findings may represent small amount of hemorrhage. 3. 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 4. Cholelithiasis. 5. Diffuse body wall edema. 6. Umbilical hernia containing ascites has increased in size. Electronically Signed   By: Darliss Cheney M.D.   On: 12/26/2022 20:43    Pending Labs Unresulted Labs (From admission, onward)     Start     Ordered   12/27/22 0500  Vitamin B12  (Anemia Panel (PNL))  Tomorrow morning,   R        12/26/22 2306   12/27/22 0500  Folate  (Anemia Panel (PNL))  Tomorrow morning,   R        12/26/22 2306   12/27/22 0500  Prealbumin  Tomorrow morning,   R        12/26/22 2306   12/26/22 2322  Ethanol  Once,   STAT        12/26/22 2322   12/26/22 2319  Rapid urine drug screen (hospital performed)  ONCE - STAT,   STAT        12/26/22 2318   12/26/22 2307  Iron and TIBC  (Anemia Panel (PNL))  Add-on,   AD        12/26/22 2306   12/26/22 2307  Ferritin  (Anemia Panel (PNL))  Add-on,   AD        12/26/22 2306   12/26/22 2307  Reticulocytes  (Anemia Panel (PNL))  Add-on,   AD        12/26/22 2306   12/26/22 2307  Procalcitonin  Add-on,   AD       References:  Procalcitonin Lower Respiratory Tract Infection AND Sepsis Procalcitonin Algorithm   12/26/22 2306   12/26/22 2307  Ammonia  Once,   STAT       Question:  Release to patient  Answer:  Immediate   12/26/22 2306   12/26/22 2307  CK  Add-on,   AD        12/26/22 2306   12/26/22 2307  Magnesium  Add-on,   AD        12/26/22 2306    12/26/22 2307  Phosphorus  Add-on,   AD        12/26/22 2306   12/26/22 2307  Creatinine, urine, random  Once,   URGENT        12/26/22 2306   12/26/22 2307  Osmolality, urine  Once,   URGENT        12/26/22 2306   12/26/22 2307  Osmolality  Add-on,   AD        12/26/22 2306   12/26/22 2307  Sodium, urine, random  Once,   URGENT        12/26/22 2306   12/26/22 2307  TSH  Add-on,   AD        12/26/22 2306   12/26/22 2307  Urinalysis, Complete w Microscopic -Urine, Clean Catch  Once,   URGENT       Question Answer Comment  Release to patient Immediate   Specimen Source Urine, Clean Catch      12/26/22 2306   Signed and Held  Magnesium  Tomorrow morning,   R        Signed and Held   Signed and Held  Phosphorus  Tomorrow morning,   R        Signed and Held   Signed and Held  Comprehensive metabolic panel  Tomorrow morning,   R       Question:  Release to patient  Answer:  Immediate   Signed and Held   Signed and Held  CBC  Tomorrow morning,   R       Question:  Release to patient  Answer:  Immediate   Signed and Held            Vitals/Pain Today's Vitals   12/26/22 1857 12/26/22 1900 12/26/22 2200 12/26/22 2259  BP: (!) 105/59 113/70 113/65   Pulse: 94 90 92   Resp: 15 13 12    Temp: 97.9 F (36.6 C)   98.2 F (36.8 C)  TempSrc: Oral   Oral  SpO2: 100% 100% 100%   PainSc: 5        Isolation Precautions No active isolations  Medications Medications  albumin human 25 % solution 12.5 g (has no administration in time range)  fentaNYL (SUBLIMAZE) injection 50 mcg (50 mcg Intravenous Given 12/26/22 1701)  iohexol (OMNIPAQUE) 350 MG/ML injection 75 mL (75 mLs Intravenous Contrast Given 12/26/22 1845)    Mobility walks     Focused Assessments    R Recommendations: See Admitting Provider Note  Report given to:   Additional Notes:  Spanish Speaking

## 2022-12-26 NOTE — Assessment & Plan Note (Signed)
 In the setting of cirrhosis

## 2022-12-26 NOTE — ED Triage Notes (Signed)
Pt c.o abd pain, hx of liver cirrhosis with paracentesis. States he had 1 beer on friday

## 2022-12-26 NOTE — ED Provider Triage Note (Signed)
Emergency Medicine Provider Triage Evaluation Note  Mathew [name] , a 42 y.o. male  was evaluated in triage.  Pt complains of abdominal pain. H/o cirrhosis. Has been going since December 11. Denies N/v/d. Reports drinking a beer on Friday. Denies fever.  Review of Systems  Positive: See above Negative: See above  Physical Exam  BP 128/79 (BP Location: Right Arm)   Pulse 97   Temp 98.2 F (36.8 C)   Resp 16   SpO2 100%  Gen:   Awake, no distress   Resp:  Normal effort  MSK:   Moves extremities without difficulty  Other:    Medical Decision Making  Medically screening exam initiated at 12:40 PM.  Appropriate orders placed.  Mathew [name] was informed that the remainder of the evaluation will be completed by another provider, this initial triage assessment does not replace that evaluation, and the importance of remaining in the ED until their evaluation is complete.  Work up started   [name], PA-C 12/26/22 1241

## 2022-12-26 NOTE — Assessment & Plan Note (Addendum)
Will need IR consult for paracentesis tomorrow Labs and albumin ordered

## 2022-12-26 NOTE — Assessment & Plan Note (Signed)
 Obtain anemia panel  Transfuse for Hg <7 , rapidly dropping or  if symptomatic

## 2022-12-26 NOTE — Assessment & Plan Note (Signed)
-  Spoke about importance of quitting spent 5 minutes discussing options for treatment, prior attempts at quitting, and dangers of smoking ? -At this point patient is    interested in quitting ? - order nicotine patch  ? - nursing tobacco cessation protocol ? ?

## 2022-12-26 NOTE — Assessment & Plan Note (Addendum)
Monitor for any sign of withdrawal repots only had one beer on Friday

## 2022-12-27 ENCOUNTER — Encounter: Payer: Self-pay | Admitting: Licensed Clinical Social Worker

## 2022-12-27 ENCOUNTER — Observation Stay (HOSPITAL_COMMUNITY): Payer: Self-pay

## 2022-12-27 HISTORY — PX: IR PARACENTESIS: IMG2679

## 2022-12-27 LAB — BASIC METABOLIC PANEL
Anion gap: 5 (ref 5–15)
Anion gap: 6 (ref 5–15)
BUN: 16 mg/dL (ref 6–20)
BUN: 17 mg/dL (ref 6–20)
CO2: 20 mmol/L — ABNORMAL LOW (ref 22–32)
CO2: 16 mmol/L — ABNORMAL LOW (ref 22–32)
Calcium: 8.3 mg/dL — ABNORMAL LOW (ref 8.9–10.3)
Calcium: 8 mg/dL — ABNORMAL LOW (ref 8.9–10.3)
Chloride: 100 mmol/L (ref 98–111)
Chloride: 102 mmol/L (ref 98–111)
Creatinine, Ser: 0.96 mg/dL (ref 0.61–1.24)
Creatinine, Ser: 0.85 mg/dL (ref 0.61–1.24)
GFR, Estimated: >60 mL/min (ref >60)
GFR, Estimated: >60 mL/min (ref >60)
Glucose, Bld: 149 mg/dL — ABNORMAL HIGH (ref 70–99)
Glucose, Bld: 101 mg/dL — ABNORMAL HIGH (ref 70–99)
Potassium: 4.6 mmol/L (ref 3.5–5.1)
Potassium: 4.7 mmol/L (ref 3.5–5.1)
Sodium: 125 mmol/L — ABNORMAL LOW (ref 135–145)
Sodium: 124 mmol/L — ABNORMAL LOW (ref 135–145)

## 2022-12-27 LAB — TSH: TSH: 3.764 u[IU]/mL (ref 0.350–4.500)

## 2022-12-27 LAB — OSMOLALITY: Osmolality: 272 mosm/kg — ABNORMAL LOW (ref 275–295)

## 2022-12-27 LAB — COMPREHENSIVE METABOLIC PANEL
ALT: 16 U/L (ref 0–44)
AST: 29 U/L (ref 15–41)
Albumin: 2.3 g/dL — ABNORMAL LOW (ref 3.5–5.0)
Alkaline Phosphatase: 89 U/L (ref 38–126)
Anion gap: 7 (ref 5–15)
BUN: 17 mg/dL (ref 6–20)
CO2: 18 mmol/L — ABNORMAL LOW (ref 22–32)
Calcium: 8.1 mg/dL — ABNORMAL LOW (ref 8.9–10.3)
Chloride: 100 mmol/L (ref 98–111)
Creatinine, Ser: 0.79 mg/dL (ref 0.61–1.24)
GFR, Estimated: >60 mL/min (ref >60)
Glucose, Bld: 123 mg/dL — ABNORMAL HIGH (ref 70–99)
Potassium: 4.5 mmol/L (ref 3.5–5.1)
Sodium: 125 mmol/L — ABNORMAL LOW (ref 135–145)
Total Bilirubin: 3.8 mg/dL — ABNORMAL HIGH (ref <1.2)
Total Protein: 4.7 g/dL — ABNORMAL LOW (ref 6.5–8.1)

## 2022-12-27 LAB — PROTIME-INR
INR: 2 — ABNORMAL HIGH (ref 0.8–1.2)
Prothrombin Time: 22.6 s — ABNORMAL HIGH (ref 11.4–15.2)

## 2022-12-27 LAB — CBC
HCT: 18.9 % — ABNORMAL LOW (ref 39.0–52.0)
Hemoglobin: 6.5 g/dL — CL (ref 13.0–17.0)
MCH: 35.5 pg — ABNORMAL HIGH (ref 26.0–34.0)
MCHC: 34.4 g/dL (ref 30.0–36.0)
MCV: 103.3 fL — ABNORMAL HIGH (ref 80.0–100.0)
Platelets: 70 10*3/uL — ABNORMAL LOW (ref 150–400)
RBC: 1.83 MIL/uL — ABNORMAL LOW (ref 4.22–5.81)
RDW: 14.7 % (ref 11.5–15.5)
WBC: 3 10*3/uL — ABNORMAL LOW (ref 4.0–10.5)
nRBC: 0 % (ref 0.0–0.2)

## 2022-12-27 LAB — RETICULOCYTES
Immature Retic Fract: 19.1 % — ABNORMAL HIGH (ref 2.3–15.9)
RBC.: 1.87 MIL/uL — ABNORMAL LOW (ref 4.22–5.81)
Retic Count, Absolute: 183.4 10*3/uL (ref 19.0–186.0)
Retic Ct Pct: 9.8 % — ABNORMAL HIGH (ref 0.4–3.1)

## 2022-12-27 LAB — BODY FLUID CELL COUNT WITH DIFFERENTIAL
Eos, Fluid: 0 %
Lymphs, Fluid: 56 %
Monocyte-Macrophage-Serous Fluid: 41 % — ABNORMAL LOW (ref 50–90)
Neutrophil Count, Fluid: 3 % (ref 0–25)
Total Nucleated Cell Count, Fluid: 173 uL (ref 0–1000)

## 2022-12-27 LAB — ALBUMIN, PLEURAL OR PERITONEAL FLUID: Albumin, Fluid: <1.5 g/dL

## 2022-12-27 LAB — PHOSPHORUS
Phosphorus: 4.3 mg/dL (ref 2.5–4.6)
Phosphorus: 4.2 mg/dL (ref 2.5–4.6)

## 2022-12-27 LAB — MAGNESIUM
Magnesium: 2.2 mg/dL (ref 1.7–2.4)
Magnesium: 2.1 mg/dL (ref 1.7–2.4)

## 2022-12-27 LAB — PROTEIN, PLEURAL OR PERITONEAL FLUID: Total protein, fluid: <3 g/dL

## 2022-12-27 LAB — AMMONIA: Ammonia: 67 umol/L — ABNORMAL HIGH (ref 9–35)

## 2022-12-27 LAB — PREALBUMIN: Prealbumin: <5 mg/dL — ABNORMAL LOW (ref 18–38)

## 2022-12-27 LAB — PREPARE RBC (CROSSMATCH)

## 2022-12-27 LAB — IRON AND TIBC
Iron: 57 ug/dL (ref 45–182)
Saturation Ratios: 49 % — ABNORMAL HIGH (ref 17.9–39.5)
TIBC: 116 ug/dL — ABNORMAL LOW (ref 250–450)
UIBC: 59 ug/dL

## 2022-12-27 LAB — LACTATE DEHYDROGENASE, PLEURAL OR PERITONEAL FLUID: LD, Fluid: 40 U/L — ABNORMAL HIGH (ref 3–23)

## 2022-12-27 LAB — FERRITIN: Ferritin: 169 ng/mL (ref 24–336)

## 2022-12-27 LAB — HEMOGLOBIN AND HEMATOCRIT, BLOOD
HCT: 21.1 % — ABNORMAL LOW (ref 39.0–52.0)
Hemoglobin: 7.6 g/dL — ABNORMAL LOW (ref 13.0–17.0)

## 2022-12-27 LAB — FOLATE: Folate: 13.4 ng/mL (ref >5.9)

## 2022-12-27 LAB — PROCALCITONIN: Procalcitonin: <0.1 ng/mL

## 2022-12-27 LAB — ETHANOL: Alcohol, Ethyl (B): <10 mg/dL (ref <10)

## 2022-12-27 LAB — CK: Total CK: 44 U/L — ABNORMAL LOW (ref 49–397)

## 2022-12-27 LAB — VITAMIN B12: Vitamin B-12: 748 pg/mL (ref 180–914)

## 2022-12-27 MED ORDER — FUROSEMIDE 20 MG PO TABS
20.0000 mg | ORAL_TABLET | Freq: Every day | ORAL | Status: DC
  Administered 2022-12-27 – 2022-12-29 (×3): 20 mg via ORAL
  Filled 2022-12-27 (×3): qty 1

## 2022-12-27 MED ORDER — SULFAMETHOXAZOLE-TRIMETHOPRIM 800-160 MG PO TABS
1.0000 | ORAL_TABLET | Freq: Every day | ORAL | Status: DC
  Administered 2022-12-27 – 2022-12-29 (×3): 1 via ORAL
  Filled 2022-12-27 (×3): qty 1

## 2022-12-27 MED ORDER — SPIRONOLACTONE 25 MG PO TABS
50.0000 mg | ORAL_TABLET | Freq: Every day | ORAL | Status: DC
Start: 2022-12-27 — End: 2022-12-29
  Administered 2022-12-27 – 2022-12-29 (×3): 50 mg via ORAL
  Filled 2022-12-27 (×3): qty 2

## 2022-12-27 MED ORDER — SODIUM CHLORIDE 0.9% IV SOLUTION: Freq: Once | INTRAVENOUS | Status: AC

## 2022-12-27 MED ORDER — PHYTONADIONE 5 MG PO TABS
10.0000 mg | ORAL_TABLET | Freq: Once | ORAL | Status: AC
  Administered 2022-12-27: 10 mg via ORAL
  Filled 2022-12-27: qty 2

## 2022-12-27 MED ORDER — LIDOCAINE HCL (PF) 1 % IJ SOLN
10.0000 mL | Freq: Once | INTRAMUSCULAR | Status: DC
  Filled 2022-12-27: qty 10

## 2022-12-27 MED ORDER — LIDOCAINE HCL 1 % IJ SOLN
INTRAMUSCULAR | Status: AC
  Filled 2022-12-27: qty 20

## 2022-12-27 NOTE — Progress Notes (Signed)
Physical Therapy Note  (Full PT eval to follow)  Obtained Orthostatic vitals:     12/27/22 1330  Vital Signs  Patient Position (if appropriate) Orthostatic Vitals  Orthostatic Lying   BP- Lying 115/72  Pulse- Lying 99  Orthostatic Sitting  BP- Sitting 125/76  Pulse- Sitting 102  Orthostatic Standing at 0 minutes  BP- Standing at 0 minutes 119/89  Pulse- Standing at 0 minutes 107  Orthostatic Standing at 3 minutes  BP- Standing at 3 minutes 137/84  Pulse- Standing at 3 minutes 120 (Taken shortly after pt vomitted)     Pt also requests Medical Team to contact his daughter in [place]; [place], [phone];    Van Clines, PT  Acute Rehabilitation Services Office [phone] Secure Chat welcomed

## 2022-12-27 NOTE — Progress Notes (Addendum)
[facility] PROGRESS NOTE  Mathew [name] (DOB: June 18, 1980) ZHY:[id number] PCP: [name], MD  Brief Narrative: Mathew [name] is a 42 y.o. male with a history of decompensated alcoholic cirrhosis with ongoing alcohol use, recurrent ascites, hx SBP, hx esophageal varices, and multiple hospitalizations for complications who presented to the ED on 12/26/2022 with abdominal pain found to have recurrent ascites since last paracentesis on 12/11.   Subjective: Pt hungry, wants to eat. Pain throughout abdomen, no fevers.   Objective: BP 113/65 (BP Location: Right Arm)   Pulse 92   Temp 98.1 F (36.7 C) (Oral)   Resp 15   Ht 5\' 5"  (1.651 m)   SpO2 98%   BMI 31.18 kg/m   Gen: Chronically ill-appearing male in no distress Pulm: Clear, diminished excursions, nonlabored.  CV: RRR, borderline tachycardia, no MRG, 2+ LE edema GI: Very distended, not significantly tender, +BS, soft umbilical hernia easily reducible, nontender. Neuro: Alert and oriented, no asterixis. Ext: Warm, dry Skin: No jaundice or open wounds on visualized skin   Assessment & Plan: End-stage alcoholic cirrhosis: Patient has multiple end stage features of this disease and continues to drink. Suspect, based on MELD score, his prognosis is likely limited to weeks-months. EtOH level undetectable at admission.  - Palliative care consulted, of note he was DNR in the past, but desired full code this admission. Continued goals (and/or suggested limitations) on care going forward will be helpful. - 2g sodium restricted diet - Continue lactulose, mentation at baseline. - BP seems to be reasonable to restart his lasix/spironolactone low doses. Will also restart prophylactic bactrim.  Recurrent ascites:  - Repeat paracentesis, he's followed in portal HTN IR clinic. With no fever, leukocytosis, mental status changes, SBP not very highly suspected though he does have history of this. Studies were  ordered and will be followed up. Note also PCT undetectable. - Labs from 5.6L paracentesis are pending. Pt reports improved pain post procedure.  Hyponatremia: Chronic. With low FENa.   Pancytopenia, coagulopathy (INR 2.0): With hgb down to 6.5g/dl today. 1u RBCs ordered, no active bleeding or GI symptoms to suggest EV or GI bleed, BUN normal.  - Monitor H/H posttransfusion.  - Anemia panel suggests adequate vitamin/iron stores. Macrocytosis due to liver disease. - Continue iron, folic acid - Note mention of blood tinged fluid and CT suggestive of small hemorrhage as well. VSS so doubt this represents brisk bleed, but will need to be monitored. Will give vitamin K.  Chronic hypotension:  - Continue midodrine, get albumin with paracentesis  Tobacco use: Cessation counseling was provided.  Tyrone Nine, MD [facility] [email or web address] 12/27/2022, 4:45 PM

## 2022-12-27 NOTE — Evaluation (Signed)
Occupational Therapy Evaluation Patient Details Name: Mathew [name] MRN: [id number] DOB: 1980-05-17 Today's Date: 12/27/2022   History of Present Illness Mathew [name] is a 42 y.o. male who presents with abdominal pain, distention; recent paracentiesis on 12/21/22; with medical history significant of alcohol induced cirrhosis, ascites  Pancytopenia, chronic diastolic grade 1 CHF   Clinical Impression   Pt c/o 4/10 pain in abdomen, feels pressure with the swelling in abdomen/BLEs. Pt states he lives at home with friend and friends wife, somewhat unclear after asking 3-4 times with interpreter about how much help he will have at home, but states he will have help if he needs it. Pt PLOF independent. Pt currently close to baseline, independent with balance, bed mobility, transfers, UB ADLs, due to significant abdominal distension will need a little help for socks/shoes and pants, but overall doing well. Pt states he feels he has no OT needs or follow up needs, and no DME needs. I agree that Pt is doing well, has no acute OT needs, and seems to have help for LB dressing if needed upon return home. No follow up needed.       If plan is discharge home, recommend the following: Assist for transportation;A little help with bathing/dressing/bathroom;Assistance with cooking/housework    Functional Status Assessment  Patient has had a recent decline in their functional status and demonstrates the ability to make significant improvements in function in a reasonable and predictable amount of time.  Equipment Recommendations  None recommended by OT    Recommendations for Other Services       Precautions / Restrictions Precautions Precautions: Other (comment) Precaution Comments: abdominal distention Restrictions Weight Bearing Restrictions Per Provider Order: No      Mobility Bed Mobility Overal bed mobility: Modified Independent             General bed mobility  comments: mod I    Transfers Overall transfer level: Modified independent                 General transfer comment: no physical assistance, no LOB, no dizziness, normal gait      Balance Overall balance assessment: Modified Independent                                         ADL either performed or assessed with clinical judgement   ADL Overall ADL's : At baseline;Modified independent                                       General ADL Comments: close to baseline, abdominal swelling limits LE ADLs, independent with UB ADLs and balance/mobility, will have help for socks/shoes at home     Vision Baseline Vision/History: 0 No visual deficits Ability to See in Adequate Light: 1 Impaired Patient Visual Report: No change from baseline       Perception         Praxis         Pertinent Vitals/Pain Pain Assessment Pain Assessment: 0-10     Extremity/Trunk Assessment Upper Extremity Assessment Upper Extremity Assessment: Overall WFL for tasks assessed           Communication Communication Communication: No apparent difficulties;Other (comment) (spanish speaking Mathew [name] # [phone])   Cognition Arousal: Alert Behavior During Therapy: WFL for tasks assessed/performed Overall Cognitive  Status: Within Functional Limits for tasks assessed                                       General Comments       Exercises     Shoulder Instructions      Home Living Family/patient expects to be discharged to:: Private residence Living Arrangements: Non-relatives/Friends Available Help at Discharge: Friend(s) Type of Home: House Home Access: Stairs to enter [employer] of Steps: 2 Entrance Stairs-Rails: Right;Left Home Layout: One level     Bathroom Shower/Tub: Tub only   Firefighter: Standard Bathroom Accessibility: No   Home Equipment: None   Additional Comments: Pt states he lives with his friend and  wife      Prior Functioning/Environment Prior Level of Function : Independent/Modified Independent;Driving;Working/employed             Mobility Comments: works in Doctor, general practice ADLs Comments: reports indep with adls and iadls (cooking)        OT Problem List: Pain;Increased edema      OT Treatment/Interventions:      OT Goals(Current goals can be found in the care plan section) Acute Rehab OT Goals Patient Stated Goal: to return home OT Goal Formulation: With patient Time For Goal Achievement: 01/10/23 Potential to Achieve Goals: Good  OT Frequency:      Co-evaluation              AM-PAC OT "6 Clicks" Daily Activity     Outcome Measure Help from another person eating meals?: None Help from another person taking care of personal grooming?: None Help from another person toileting, which includes using toliet, bedpan, or urinal?: None Help from another person bathing (including washing, rinsing, drying)?: A Little Help from another person to put on and taking off regular upper body clothing?: None Help from another person to put on and taking off regular lower body clothing?: A Little 6 Click Score: 22   End of Session Equipment Utilized During Treatment: Gait belt Nurse Communication: Mobility status  Activity Tolerance: Patient tolerated treatment well Patient left: in bed;with call bell/phone within reach  OT Visit Diagnosis: Pain Pain - part of body:  (abdomen)                Time: 1410-1435 OT Time Calculation (min): 25 min Charges:  OT General Charges $OT Visit: 1 Visit OT Evaluation $OT Eval Low Complexity: 1 Low OT Treatments $Self Care/Home Management : 8-22 mins  [place], OTR/L  Alexis Goodell 12/27/2022, 2:38 PM

## 2022-12-27 NOTE — TOC CM/SW Note (Signed)
Transition of Care [facility]) - Inpatient Brief Assessment   Patient Details  Name: Issiah Venhaus MRN: [id number] Date of Birth: Oct 06, 1980  Transition of Care [facility]) CM/SW Contact:    Tom-Johnson, Hershal Coria, RN Phone Number: 12/27/2022, 11:27 AM   Clinical Narrative:  Patient presented to the ED with Abdominal Pain and Distention. Patient has hx of Alcoholic Liver Cirrhosis with Outpatient Paracentesis.  Patient was recently admitted and underwent Paracentesis and 14 L of Ascitic fluid was drained. Patient underwent Paracentesis from the RLQ today and yielded 5.6 L of blood-tinged fluid. IR following.  Patient's hgb today at 6.5 received 1U PRBC.   CM spoke with patient at bedside about post hospital transition. From home with a friend. Has three children, two lives in [place]. Both parents lives in [place]. Has a supportive brother.  Does not have Medical Insurance and no PCP, has a Pulmonologist. CM called to schedule hosp f/u with Internal Medicine, they are not accepting new patient's until next year. CM called to schedule at [facility] but closed at this time. Will try again tomorrow.  No OT f/u noted. Cage Aid done, notes in.   Patient not Medically ready for discharge.  CM will continue to follow as patient progresses with care towards discharge.        Transition of Care Asessment: Insurance and Status: Insurance coverage has been reviewed (No Aeronautical engineer) Patient has primary care physician: No (Has a Pulmonologist.) Home environment has been reviewed: Yes Prior level of function:: Independent Prior/Current Home Services: No current home services Social Drivers of Health Review: SDOH reviewed needs interventions (Cage aid done, bedside education done and SA educational materials on AVS.) Readmission risk has been reviewed: Yes Transition of care needs: transition of care needs identified, TOC will continue to follow

## 2022-12-27 NOTE — Progress Notes (Addendum)
Initial Nutrition Assessment  DOCUMENTATION CODES:   Not applicable  INTERVENTION:   -Recommend to advancement to regular with double protein portions BID when medically appropriate.  -Supplement meals with Prosource Plus 30 mL PO tid, each packet provides 80 calories, 20 gm protein.  -Recommend thiamine 100 mg daily, MVI/Minerals-1 Tab daily, continue folic acid. Consider high dose thiamine protocol.     NUTRITION DIAGNOSIS:   Increased nutrient needs related to catabolic illness, chronic illness, social / environmental circumstances (displacement of nutrients with ETOH) as evidenced by estimated needs.    GOAL:   Patient will meet greater than or equal to 90% of their needs    MONITOR:   PO intake, Supplement acceptance, Diet advancement, Skin, Labs, Weight trends, I & O's  REASON FOR ASSESSMENT:   Consult Assessment of nutrition requirement/status  ASSESSMENT: 42 y/o male presented with abdominal pain and distention. Continues to drink alcohol.  Last paracentesis was 12/15/22 with 5L fluid removed.  PMH: alcohol induced cirrhosis, ascites, pancytopenia, chronic diastolic CHF, SBP, acute metabolic encephalopathy, alcohol withdrawal seizure, pneumonia.  Language Interpretor computer wound not not connect. Patient informs he can speak and understand some [place]. He consumes three meals daily on usual basis. He is asking when he will be able to eat, currently NPO.  Denies loss of appetite. Describes recommendations from his provider  such as fish and chicken for protein.  Per review of EMR, difficult to confirm weight loss due to ascites. Edema 3+ RLE, LLE.  Medications reviewed and include ferrous sulfate 325 mg daily with B, folic acid 1 mg daily, lactulose, albumin 12.5 gm IV.  Labs reviewed  NUTRITION - FOCUSED PHYSICAL EXAM:  Flowsheet Row Most Recent Value  Orbital Region No depletion  Upper Arm Region Mild depletion  Thoracic and Lumbar Region No depletion   Buccal Region Mild depletion  Temple Region No depletion  Clavicle Bone Region No depletion  Clavicle and Acromion Bone Region No depletion  Scapular Bone Region No depletion  Dorsal Hand No depletion  Patellar Region No depletion  Anterior Thigh Region No depletion  Posterior Calf Region No depletion  Edema (RD Assessment) Severe  [3+ bilateral lower extremity]  Hair Reviewed  Eyes Reviewed  Mouth Reviewed  Skin Reviewed  Nails Reviewed       Diet Order:   Diet Order             Diet NPO time specified  Diet effective midnight                   EDUCATION NEEDS:   Not appropriate for education at this time  Skin:  Skin Assessment: Reviewed RN Assessment  Last BM:  prior to admission  Height:   Ht Readings from Last 1 Encounters:  12/27/22 5\' 5"  (1.651 m)    Weight:   Wt Readings from Last 1 Encounters:  12/06/22 85 kg    Ideal Body Weight:  61.8 kg  BMI:  Body mass index is 31.18 kg/m.  Estimated Nutritional Needs:   Kcal:  1850-2100 kcal/day  Protein:  80-93 gm/day  Fluid:  1850-2100 mL/day    Alvino Chapel, RDLD Clinical Dietitian If unable to reach, please contact "RD Inpatient" secure chat group between 8 am-4 pm daily"

## 2022-12-27 NOTE — Procedures (Signed)
PROCEDURE SUMMARY:  Successful ultrasound guided paracentesis from the right lower quadrant.  Yielded 5.6 L of blood-tinged fluid.  No immediate complications.  The patient tolerated the procedure well.   Specimen sent for labs.  EBL < 2 mL  The patient has previously been formally evaluated by the [facility] and is being actively followed for potential future intervention.  Alwyn Ren, AGACNP-BC 12/27/2022, 4:00 PM

## 2022-12-27 NOTE — TOC CAGE-AID Note (Signed)
Transition of Care [facility]) - CAGE-AID Screening   Patient Details  Name: Mathew [name] MRN: [id number] Date of Birth: Dec 28, 1980  Transition of Care [facility]) CM/SW Contact:    Tom-Johnson, Hershal Coria, RN Phone Number: 12/27/2022, 11:22 AM   Clinical Narrative:  Cage aid done, educational materials on AVS.       CAGE-AID Screening:    Have You Ever Felt You Ought to Cut Down on Your Drinking or Drug Use?: Yes Have People Annoyed You By Critizing Your Drinking Or Drug Use?: Yes Have You Felt Bad Or Guilty About Your Drinking Or Drug Use?: Yes Have You Ever Had a Drink or Used Drugs First Thing In The Morning to Steady Your Nerves or to Get Rid of a Hangover?: Yes CAGE-AID Score: 4     Substance abuse interventions: Educational Materials

## 2022-12-27 NOTE — Evaluation (Signed)
Physical Therapy Evaluation Patient Details Name: Mathew [name] MRN: [id number] DOB: [birth year] Today's Date: 12/27/2022  History of Present Illness  Mathew [name] is a 42 y.o. male who presents with abdominal pain, distention; recent paracentiesis on 12/21/22; with medical history significant of alcohol induced cirrhosis, ascites  Pancytopenia, chronic diastolic grade 1 CHF  Clinical Impression   Pt admitted with above diagnosis. Lives at home with 2 roommates, in a single-level home with a lot of  steps to enter; Prior to admission, pt was managing independently; Presents to PT with difficulty moving due to abdominal distention;  Overall supervision getting up to EOB, stnding, and walking short distance; Will pick up for PT to work on gait, stairs, endurance, in anticipation of DC home soon; Do not anticipate the need for PT follow up; Pt currently with functional limitations due to the deficits listed below (see PT Problem List). Pt will benefit from skilled PT to increase their independence and safety with mobility to allow discharge to the venue listed below.           If plan is discharge home, recommend the following: Assistance with cooking/housework   Can travel by private vehicle        Equipment Recommendations None recommended by PT  Recommendations for Other Services       Functional Status Assessment Patient has had a recent decline in their functional status and demonstrates the ability to make significant improvements in function in a reasonable and predictable amount of time.     Precautions / Restrictions Precautions Precautions: Other (comment) Precaution Comments: abdominal distention Restrictions Weight Bearing Restrictions Per Provider Order: No      Mobility  Bed Mobility Overal bed mobility: Needs Assistance Bed Mobility: Supine to Sit     Supine to sit: Supervision     General bed mobility comments: slow moving and  uncomfortable    Transfers Overall transfer level: Needs assistance Equipment used: None Transfers: Sit to/from Stand Sit to Stand: Supervision           General transfer comment: no physical assistance, no LOB, no dizziness; cues to self-monitor for activity tolerance    Ambulation/Gait Ambulation/Gait assistance: Supervision (and assist with lines to bathroom) Gait Distance (Feet): 20 Feet (to/from bathroom) Assistive device: None Gait Pattern/deviations: Step-through pattern, Decreased step length - right, Decreased step length - left Gait velocity: slow     General Gait Details: slow and uncomfortable; assist for line management only  Stairs            Wheelchair Mobility     Tilt Bed    Modified Rankin (Stroke Patients Only)       Balance                                             Pertinent Vitals/Pain Pain Assessment Pain Assessment: Faces Faces Pain Scale: Hurts even more Pain Location: Abdominal pressure and discomfort Pain Descriptors / Indicators: Discomfort, Pressure Pain Intervention(s): Monitored during session, Limited activity within patient's tolerance    Home Living Family/patient expects to be discharged to:: Private residence Living Arrangements: Non-relatives/Friends Available Help at Discharge: Friend(s) Type of Home: House Home Access: Stairs to enter Entrance Stairs-Rails: Doctor, general practice of Steps: 2   Home Layout: One level Home Equipment: None Additional Comments: Pt states he lives with his friend and wife    Prior  Function Prior Level of Function : Independent/Modified Independent;Driving;Working/employed             Mobility Comments: works in Doctor, general practice ADLs Comments: reports indep with adls and iadls (cooking)     Extremity/Trunk Assessment   Upper Extremity Assessment Upper Extremity Assessment: Overall WFL for tasks assessed    Lower Extremity  Assessment Lower Extremity Assessment: Overall WFL for tasks assessed (for simple in room tasks)    Cervical / Trunk Assessment Cervical / Trunk Assessment: Other exceptions Cervical / Trunk Exceptions: significant abdominal distention  Communication   Communication Communication: No apparent difficulties;Other (comment) (spanish speaking Ozzy # [phone])  Cognition Arousal: Alert Behavior During Therapy: WFL for tasks assessed/performed Overall Cognitive Status: Within Functional Limits for tasks assessed                                          General Comments General comments (skin integrity, edema, etc.): See other note of this date for orthostatic vitals    Exercises     Assessment/Plan    PT Assessment Patient needs continued PT services  PT Problem List Decreased activity tolerance;Decreased mobility;Decreased knowledge of use of DME       PT Treatment Interventions DME instruction;Gait training;Stair training;Functional mobility training;Therapeutic activities;Therapeutic exercise;Balance training;Patient/family education    PT Goals (Current goals can be found in the Care Plan section)  Acute Rehab PT Goals Patient Stated Goal: get fluid off of his abdomen PT Goal Formulation: With patient Time For Goal Achievement: 01/10/23 Potential to Achieve Goals: Good    Frequency Min 1X/week     Co-evaluation               AM-PAC PT "6 Clicks" Mobility  Outcome Measure Help needed turning from your back to your side while in a flat bed without using bedrails?: None Help needed moving from lying on your back to sitting on the side of a flat bed without using bedrails?: None Help needed moving to and from a bed to a chair (including a wheelchair)?: None Help needed standing up from a chair using your arms (e.g., wheelchair or bedside chair)?: None Help needed to walk in hospital room?: A Little Help needed climbing 3-5 steps with a railing? : A  Little 6 Click Score: 22    End of Session   Activity Tolerance: Patient tolerated treatment well;Other (comment) (though with N/Vend of session) Patient left: in bed;with call bell/phone within reach Nurse Communication: Mobility status (adn pt vomiting) PT Visit Diagnosis: Other abnormalities of gait and mobility (R26.89)    Time: 9562-1308 (minus about 5-8 minutes while pt was in the bathroom) PT Time Calculation (min) (ACUTE ONLY): 43 min   Charges:   PT Evaluation $PT Eval Low Complexity: 1 Low PT Treatments $Gait Training: 8-22 mins PT General Charges $$ ACUTE PT VISIT: 1 Visit         Van Clines, PT  Acute Rehabilitation Services Office [phone] Secure Chat welcomed   Levi Aland 12/27/2022, 4:51 PM

## 2022-12-27 NOTE — Plan of Care (Signed)

## 2022-12-27 NOTE — Progress Notes (Signed)
   Acute on chronic anemia - Hemoglobin 6.7.  Baseline hemoglobin around 9.7-10. Checking FOBT.  Patient does not have any active source of bleeding - Transfusing 1 unit of blood.  Goal to keep hemoglobin above 7.

## 2022-12-28 LAB — CBC
HCT: 22.9 % — ABNORMAL LOW (ref 39.0–52.0)
Hemoglobin: 7.9 g/dL — ABNORMAL LOW (ref 13.0–17.0)
MCH: 34.5 pg — ABNORMAL HIGH (ref 26.0–34.0)
MCHC: 34.5 g/dL (ref 30.0–36.0)
MCV: 100 fL (ref 80.0–100.0)
Platelets: 75 10*3/uL — ABNORMAL LOW (ref 150–400)
RBC: 2.29 MIL/uL — ABNORMAL LOW (ref 4.22–5.81)
RDW: 16.9 % — ABNORMAL HIGH (ref 11.5–15.5)
WBC: 4.2 10*3/uL (ref 4.0–10.5)
nRBC: 0 % (ref 0.0–0.2)

## 2022-12-28 LAB — COMPREHENSIVE METABOLIC PANEL
ALT: 15 U/L (ref 0–44)
AST: 26 U/L (ref 15–41)
Albumin: 2.4 g/dL — ABNORMAL LOW (ref 3.5–5.0)
Alkaline Phosphatase: 84 U/L (ref 38–126)
Anion gap: 6 (ref 5–15)
BUN: 19 mg/dL (ref 6–20)
CO2: 17 mmol/L — ABNORMAL LOW (ref 22–32)
Calcium: 7.6 mg/dL — ABNORMAL LOW (ref 8.9–10.3)
Chloride: 100 mmol/L (ref 98–111)
Creatinine, Ser: 0.97 mg/dL (ref 0.61–1.24)
GFR, Estimated: >60 mL/min (ref >60)
Glucose, Bld: 104 mg/dL — ABNORMAL HIGH (ref 70–99)
Potassium: 3.9 mmol/L (ref 3.5–5.1)
Sodium: 123 mmol/L — ABNORMAL LOW (ref 135–145)
Total Bilirubin: 5.7 mg/dL — ABNORMAL HIGH (ref <1.2)
Total Protein: 4.8 g/dL — ABNORMAL LOW (ref 6.5–8.1)

## 2022-12-28 LAB — PROTIME-INR
INR: 2 — ABNORMAL HIGH (ref 0.8–1.2)
Prothrombin Time: 22.7 s — ABNORMAL HIGH (ref 11.4–15.2)

## 2022-12-28 NOTE — Progress Notes (Signed)
This chaplain responded to PMT NP-Michelle consult for creating the Pt. Advance Directive: HCPOA only. The chaplain completed AD education with the use of iPad Spanish interpreter [phone]. The Pt. brother-Victor was present with the use of the Pt. speaker phone.   The chaplain understands Alecia Lemming will visit tonight after work, review the education with the Pt., and fill out the document. If the Pt. decides to proceed with making the AD legal the chaplain will revisit on Thursday and take the necessary steps to contact the notary.  Alecia Lemming affirmed PMT involvement in the Pt. care.  The chaplain understands Alecia Lemming wants to continue to explore "sponsorship" in the community. This chaplain updated PMT of this request.  This chaplain is available for F/U spiritual care as needed.  Chaplain Stephanie Acre [phone]

## 2022-12-28 NOTE — Progress Notes (Signed)
PT Cancellation Note  Patient Details Name: Radwan Lone [name] MRN: [id number] DOB: 27-Nov-1980   Cancelled Treatment:    Reason Eval/Treat Not Completed: Other (comment)  Karmell was emotional and crying with this PT after his conversations with Palliative Care;  Politely declined amb at this time;   Will continue to follow -- anticipate will likely meet PT goals next session;   Van Clines, PT  Acute Rehabilitation Services Office [phone] Secure Chat welcomed    Levi Aland 12/28/2022, 2:03 PM

## 2022-12-28 NOTE — Consult Note (Signed)
Palliative Medicine Inpatient Consult Note  Consulting Provider: Tyrone Nine, MD   Reason for consult:   Palliative Care Consult Services Palliative Medicine Consult  Reason for Consult? Nearing end stage alcohol-related cirrhosis   12/28/2022  HPI:  Per intake H&P --> Mathew [name] is a 42 y.o. male with a history of decompensated alcoholic cirrhosis with ongoing alcohol use, recurrent ascites, hx SBP, hx esophageal varices, recurrent ascites and multiple hospitalizations for complications .   Palliative care has been asked to get involved to help support additional goals of care conversations.   Clinical Assessment/Goals of Care:  *Please note that this is a verbal dictation therefore any spelling or grammatical errors are due to the "Dragon Medical One" system interpretation.  Interpretation services used via IPad Mathew [name] [phone]   I have reviewed medical records including EPIC notes, labs and imaging, received report from bedside RN, assessed the patient who was lying in bed in NAD.    I met with Mathew [name] to further discuss diagnosis prognosis, GOC, EOL wishes, disposition and options.   I introduced Palliative Medicine as specialized medical care for people living with serious illness. It focuses on providing relief from the symptoms and stress of a serious illness. The goal is to improve quality of life for both the patient and the family.  Medical History Review and Understanding:  A discussion of Mathew [name]'s past medical history inclusive of liver cirrhosis - alcohol induced, anemia, tobacco abuse, & alcohol abuse.   Social History:  Mathew [name] is from [place] originally. He shares that he has lived in [place] for many years now. He shares that he has had two long term relationships with woman his first relationship with his girlfriend resulted in three children who live in [place]. His second relationship resulted in one child who is 44 years old. They  are local to [facility]. 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 He is a man of the Catholic faith.   Functional and Nutritional State:  Discussed that Mathew [name] is presently living with two friends who are husband and wife. He shares that he has been able to perform bADLs on his own. He has had a good appetite except when fluid builds up. He does remain to drink alcohol from time to time when he feels "anxious". He shares he only drink, " one or two beers".   Palliative Symptoms:  Anxiety - Patient has not been on an SSRI in the past or SNRI.These would present be veered away from due to hyponatremia.   Insomnia - Patient shares he has a tough time sleeping with the "fluid build up in his stomach".  Pain - In abdomen when swollen, otherwise can sometimes hurt positionally due to his hernia.   Advance Directives:  A detailed discussion was had today regarding advanced directives.  Patient wants his brother, Mathew [name] to he his Management consultant.   Code Status:  Concepts specific to code status, artifical feeding and hydration, continued IV antibiotics and rehospitalization was had.  The difference between a aggressive medical intervention path  and a palliative comfort care path for this patient at this time was had.   Encouraged Mathew [name] to consider DNR/DNI status understanding evidenced based poor outcomes in similar hospitalized patient, as the cause of arrest is likely associated with advanced chronic/terminal illness rather than an easily reversible acute cardio-pulmonary event. I explained that DNR/DNI does not change  the medical plan and it only comes into effect after a person has arrested (died).  It is a protective measure to keep [place] from harming the patient in their last moments of life. Mathew [name] was agreeable to DNR/DNI with understanding that patient would not receive CPR, defibrillation, ACLS medications,  or intubation.   Discussion:  We reviewed Mathew [name]'s liver cirrhosis. I shared openly and honestly that he is at the end stage of the disease. We discussed the reasons why he continues to have refractory ascites. I shared that he has such severe scarring of his liver that his blood is not properly filtered leading to ongoing build up of abdominal fluid from leaking. We reviewed this is not reversible and a common sign of end stages of the disease. Mathew [name] and I discussed hospice. I described hospice as a service for patients who have a life expectancy of 6 months or less. The goal of hospice is the preservation of dignity and quality at the end phases of life. Under hospice care, the focus changes from curative to symptom relief.   Patient asks me to call his brother. He shares that he wants to continue present measures at this time though would like to hear more about hospice.  Mathew [name] does understand his life expectancy is quite short due to his disease.   Discussed the importance of continued conversation with family and their  medical providers regarding overall plan of care and treatment options, ensuring decisions are within the context of the patients values and GOCs. _______________________________________ Addendum:  I spoke to patients brother, Mathew [name] via [employer] ID# [phone]. We discussed that over the last few months, Mathew [name] has been better about compliance with medications. Mathew [name] feels his mental state overall have improved. Mathew [name] asked about the recurrent re-hospitalizations and abdominal "taps". I shared the reasoning behind this and it's poor prognosis. I stated the consideration of hospice services. I described what these are. Mathew [name] would be interested in understanding this service further. We talked about having one of the representatives reach out to [name] and him in the near future.   Decision Maker: Mathew [name],Mathew [name] ( brother as stated by the pt.) [phone]    SUMMARY OF  RECOMMENDATIONS   DNAR/DNI  Appreciate Chaplain helping with HCPOA documents  Open and honest conversation held in the setting of patients advanced liver disease  Discussed patients limited prognosis  Reviewed Hospice as a consideration - patients and his brother are in agreement with learning more about hospice services  Appreciate TOC helping to coordinate   Ongoing Palliative support  Code Status/Advance Care Planning: DNAR/DNI  Palliative Prophylaxis:  Aspiration, Bowel Regimen, Delirium Protocol, Frequent Pain Assessment, Oral Care, Palliative Wound Care, and Turn Reposition  Additional Recommendations (Limitations, Scope, Preferences): Continue current care  Psycho-social/Spiritual:  Desire for further Chaplaincy support: Not at this time Additional Recommendations: Education on EOL care   Prognosis: Limited overall. MELD 27  Discharge Planning: Discharge to home once medically optimized.   Vitals:   12/28/22 0529 12/28/22 0846  BP: 107/61 122/71  Pulse: (!) 104 95  Resp: 18   Temp: 98.4 F (36.9 C) 98.1 F (36.7 C)  SpO2: 100% 100%    Intake/Output Summary (Last 24 hours) at 12/28/2022 1478 Last data filed at 12/28/2022 0900 Gross per 24 hour  Intake 1255 ml  Output 175 ml  Net 1080 ml   Gen:  Middle aged chronically ill appearing Hispanic Male HEENT: jaundiced sclera, moist mucous membranes CV: Regular rate and rhythm  PULM:  On RA, breathing is even and nonlabored ABD: Distended EXT: No edema  Neuro: Alert and oriented x3 - Spanish Speaking  PPS: 50%-60%   This conversation/these recommendations were discussed with patient primary care team, Dr. Mahala Menghini  Total Time: 44 Billing based on MDM: High  Problems Addressed: One acute or chronic illness or injury that poses a threat to life or bodily function  Amount and/or Complexity of Data: Category 3:Discussion of management or test interpretation with external physician/other qualified health  care professional/appropriate source (not separately reported)  Risks: Decision regarding hospitalization or escalation of hospital care and Decision not to resuscitate or to de-escalate care because of poor prognosis ______________________________________________________ Lamarr Lulas [facility] Palliative Medicine Team Team Cell Phone: [phone] Please utilize secure chat with additional questions, if there is no response within 30 minutes please call the above phone number  Palliative Medicine Team providers are available by phone from 7am to 7pm daily and can be reached through the team cell phone.  Should this patient require assistance outside of these hours, please call the patient's attending physician.

## 2022-12-28 NOTE — Progress Notes (Signed)
Mobility Specialist: Progress Note   12/28/22 1036  Mobility  Activity Ambulated independently in hallway  Level of Assistance Independent  Assistive Device None  Distance Ambulated (ft) 150 ft  Activity Response Tolerated well  Mobility Referral Yes  Mobility visit 1 Mobility  Mobility Specialist Start Time (ACUTE ONLY) 0914  Mobility Specialist Stop Time (ACUTE ONLY) 0925  Mobility Specialist Time Calculation (min) (ACUTE ONLY) 11 min    Pt was agreeable to mobility session - received in bed. No complaints of abdominal pain. Ambulated ind without fault. Left in bed with all needs met, call bell in reach.   Maurene Capes Mobility Specialist Please contact via SecureChat or Rehab office at [phone]

## 2022-12-28 NOTE — Plan of Care (Signed)

## 2022-12-28 NOTE — Progress Notes (Addendum)
[facility] Liaison Note  Received request to discuss hospice services at home after discharge vs OP Palliative Care Services. With assistance from [employer], [place], spoke with patient to initiate education related to hospice philosophy, services, and team approach to care as well as OP Palliative Care services. Patient  verbalized understanding of information given.   At this time patient denies acceptance of Hospice or Palliative services after discharge. He has been given contact numbers for Spanish interpreter with [facility].  Above information shared with Transitions of Care Manager. Please call with any questions or concerns.  Thank you for the opportunity to participate in this patient's care.   2:30pm update: Interpreter was able to reach patient's brother, Alecia Lemming to discuss earlier discussion about hospice and OP Palliative care. He is in favor of patient accepting hospice services and will talk to him this evening when he is able to come to the hospital. Alecia Lemming asked that we talk to [place] again in the morning.  Glenna Fellows BSN, Charity fundraiser, OCN [employer] [phone]

## 2022-12-28 NOTE — Progress Notes (Signed)
[facility] PROGRESS NOTE  Mathew [name] (DOB: 05-24-1980) IRJ:[id number] PCP: [name], MD  Brief Narrative: Mathew [name] is a 42 y.o. male with a history of decompensated alcoholic cirrhosis with ongoing alcohol use, recurrent ascites, hx SBP, hx esophageal varices, and multiple hospitalizations for complications who presented to the ED on 12/26/2022 with abdominal pain found to have recurrent ascites since last paracentesis on 12/11.    Subjective:   Interviewed with interpreter Abdomen small in no distress had a long conversation with palliative care otherwise asking if he can go to hospice-more conversations 22 with palliative care team in the morning  Objective: BP 106/68 (BP Location: Right Arm)   Pulse 97   Temp 98.9 F (37.2 C) (Oral)   Resp 18   Ht 5\' 5"  (1.651 m)   SpO2 99%   BMI 31.18 kg/m   Gen: Chronically ill-appearing male in no distress Pulm: Clear, diminished excursions, nonlabored.  CV: RRR, borderline tachycardia, no MRG, 2+ LE edema GI: Very distended, not significantly tender, +BS, soft umbilical hernia easily reducible, nontender. Neuro: Alert and oriented, no asterixis. Ext: Warm, dry Skin: No jaundice or open wounds on visualized skin   Assessment & Plan: End-stage alcoholic cirrhosis: Patient has multiple end stage features of this disease and continues to drink. Suspect, based on MELD score, his prognosis is likely limited to weeks-months. EtOH level undetectable at admission.  - Palliative care consulted=== patient is contemplating hospice with brother - 2g sodium restricted diet Continue lactulose, mentation at baseline. -Do not think that there is SBP -Continue Aldactone 50 Lasix 20 and prophylactic Bactrim once daily  Recurrent ascites:  - Repeat paracentesis, he's followed in portal HTN IR clinic. With no fever, leukocytosis, mental status changes, SBP not very highly suspected t - Labs from 5.6L  paracentesis are pending. Pt reports improved pain post procedure. - Continues on midodrine for blood pressure support 10 3 times daily  Hyponatremia: Chronic. With low FENa.   Pancytopenia, coagulopathy (INR 2.0): With hgb down to 6.5g/dl today. 1u RBCs ordered, no active bleeding or GI symptoms to suggest EV or GI bleed, BUN normal.  - Monitor H/H posttransfusion.  - Anemia panel suggests adequate vitamin/iron stores. Macrocytosis due to liver disease. - Continue iron, folic acid - Note mention of blood tinged fluid and CT suggestive of small hemorrhage as well. VSS so doubt this represents brisk bleed, but will need to be monitored. Will give vitamin K.  Chronic hypotension:  - Continue midodrine, get albumin with paracentesis  Tobacco use: Cessation counseling was provided.  After meeting with palliative care he is now DNR-we await further discussion with patient/family by hospice and palliative care regarding dispo  Rhetta Mura, MD [facility] [email or web address] 12/28/2022, 5:07 PM

## 2022-12-28 NOTE — TOC Progression Note (Signed)
Transition of Care [facility]) - Progression Note    Patient Details  Name: Mathew [name] MRN: [id number] Date of Birth: 09-21-1980  Transition of Care [facility]) CM/SW Contact  Tom-Johnson, Hershal Coria, RN Phone Number: 12/28/2022, 11:14 AM  Clinical Narrative:     Hospital f/u scheduled at [facility], info on AVS.  Palliative following for Goals of care.  CM will continue to follow.        Expected Discharge Plan and Services                                               Social Determinants of Health (SDOH) Interventions SDOH Screenings   Food Insecurity: Food Insecurity Present (12/27/2022)  Housing: Low Risk  (12/27/2022)  Recent Concern: Housing - Medium Risk (11/29/2022)  Transportation Needs: No Transportation Needs (12/27/2022)  Recent Concern: Transportation Needs - Unmet Transportation Needs (10/26/2022)  Utilities: Not At Risk (12/27/2022)  Recent Concern: Utilities - At Risk (11/29/2022)  Depression (PHQ2-9): High Risk (12/05/2022)  Financial Resource Strain: Medium Risk (10/26/2022)  Physical Activity: Inactive (10/26/2022)  Social Connections: Unknown (10/26/2022)  Stress: No Stress Concern Present (10/26/2022)  Tobacco Use: High Risk (12/26/2022)  Health Literacy: Adequate Health Literacy (10/26/2022)    Readmission Risk Interventions    09/26/2022    2:53 PM 06/06/2022   11:59 AM  Readmission Risk Prevention Plan  Transportation Screening Complete Complete  PCP or Specialist Appt within 3-5 Days  Complete  HRI or Home Care Consult  Complete  Social Work Consult for Recovery Care Planning/Counseling  Complete  Palliative Care Screening  Complete  Medication Review [facility]) Referral to Pharmacy Complete  PCP or Specialist appointment within 3-5 days of discharge Complete   HRI or Home Care Consult Complete   SW Recovery Care/Counseling Consult Complete   Palliative Care Screening Patient Refused   Skilled Nursing  Facility Not Applicable

## 2022-12-29 DIAGNOSIS — K7460 Unspecified cirrhosis of liver: Secondary | ICD-10-CM

## 2022-12-29 DIAGNOSIS — Z515 Encounter for palliative care: Secondary | ICD-10-CM

## 2022-12-29 DIAGNOSIS — Z7189 Other specified counseling: Secondary | ICD-10-CM

## 2022-12-29 DIAGNOSIS — R188 Other ascites: Secondary | ICD-10-CM

## 2022-12-29 DIAGNOSIS — Z66 Do not resuscitate: Secondary | ICD-10-CM

## 2022-12-29 LAB — PROTIME-INR
INR: 1.9 — ABNORMAL HIGH (ref 0.8–1.2)
Prothrombin Time: 22 s — ABNORMAL HIGH (ref 11.4–15.2)

## 2022-12-29 LAB — COMPREHENSIVE METABOLIC PANEL
ALT: 15 U/L (ref 0–44)
AST: 25 U/L (ref 15–41)
Albumin: 2.4 g/dL — ABNORMAL LOW (ref 3.5–5.0)
Alkaline Phosphatase: 95 U/L (ref 38–126)
Anion gap: 5 (ref 5–15)
BUN: 19 mg/dL (ref 6–20)
CO2: 17 mmol/L — ABNORMAL LOW (ref 22–32)
Calcium: 7.4 mg/dL — ABNORMAL LOW (ref 8.9–10.3)
Chloride: 98 mmol/L (ref 98–111)
Creatinine, Ser: 1.11 mg/dL (ref 0.61–1.24)
GFR, Estimated: >60 mL/min (ref >60)
Glucose, Bld: 128 mg/dL — ABNORMAL HIGH (ref 70–99)
Potassium: 3.6 mmol/L (ref 3.5–5.1)
Sodium: 120 mmol/L — ABNORMAL LOW (ref 135–145)
Total Bilirubin: 3.9 mg/dL — ABNORMAL HIGH (ref <1.2)
Total Protein: 4.7 g/dL — ABNORMAL LOW (ref 6.5–8.1)

## 2022-12-29 LAB — CBC
HCT: 24 % — ABNORMAL LOW (ref 39.0–52.0)
Hemoglobin: 8.4 g/dL — ABNORMAL LOW (ref 13.0–17.0)
MCH: 34.9 pg — ABNORMAL HIGH (ref 26.0–34.0)
MCHC: 35 g/dL (ref 30.0–36.0)
MCV: 99.6 fL (ref 80.0–100.0)
Platelets: 79 10*3/uL — ABNORMAL LOW (ref 150–400)
RBC: 2.41 MIL/uL — ABNORMAL LOW (ref 4.22–5.81)
RDW: 16.2 % — ABNORMAL HIGH (ref 11.5–15.5)
WBC: 4.5 10*3/uL (ref 4.0–10.5)
nRBC: 0 % (ref 0.0–0.2)

## 2022-12-29 LAB — OCCULT BLOOD X 1 CARD TO LAB, STOOL: Fecal Occult Bld: NEGATIVE

## 2022-12-29 MED ORDER — CEFAZOLIN SODIUM-DEXTROSE 2-4 GM/100ML-% IV SOLN: 2.0000 g | INTRAVENOUS | Status: AC

## 2022-12-29 MED ORDER — FENTANYL 12 MCG/HR TD PT72
1.0000 | MEDICATED_PATCH | TRANSDERMAL | Status: DC
  Administered 2022-12-29: 1 via TRANSDERMAL
  Filled 2022-12-29: qty 1

## 2022-12-29 MED ORDER — PROCHLORPERAZINE EDISYLATE 10 MG/2ML IJ SOLN
10.0000 mg | INTRAMUSCULAR | Status: DC | PRN
  Administered 2022-12-29: 10 mg via INTRAVENOUS
  Filled 2022-12-29: qty 2

## 2022-12-29 MED ORDER — FUROSEMIDE 40 MG PO TABS
40.0000 mg | ORAL_TABLET | Freq: Every day | ORAL | Status: DC
  Administered 2022-12-31: 40 mg via ORAL
  Filled 2022-12-29: qty 1

## 2022-12-29 MED ORDER — SPIRONOLACTONE 25 MG PO TABS
100.0000 mg | ORAL_TABLET | Freq: Every day | ORAL | Status: DC
  Administered 2022-12-31: 100 mg via ORAL
  Filled 2022-12-29: qty 4

## 2022-12-29 NOTE — Progress Notes (Signed)
This chaplain is present for F/U spiritual care in the setting of next steps with the Pt. HCPOA. The chaplain used the iPad interpreter for the visit.   The chaplain understands from the Pt. "I am going home with the drain. I did not finish the HCPOA. Alecia Lemming is the only person I have." The chaplain affirmed the Pt. decisions and offered F/U spiritual care as needed.  The chaplain updated the Pt. RN-Elisa on the Pt. request for patient care through secure chat before leaving the unit.  Chaplain Stephanie Acre [phone]

## 2022-12-29 NOTE — Progress Notes (Addendum)
Chief Complaint: Patient was seen in consultation today for Peritoneal pleurx   Referring Physician(s): Dr. Mahala Menghini  Supervising Physician: Marliss Coots  Patient Status: [facility] - In-pt  History of Present Illness: Mathew [name] is a 42 y.o. male with end stage alcoholic cirrhosis. Has recurrent ascites requiring frequent large volume paracentesis. Last was yesterday Poor prognosis with high MELD and ongoing alcohol abuse. After discussion with medical and palliative teams, pt is now DNR with plans to discharge with home hospice.  IR is asked to place peritoneal pleurx prior to discharge.  PMHx, meds, labs, imaging, allergies reviewed. Feels okay, some abd fullness but no pain. No family at bedside Video interpreter used for discussion    Past Medical History:  Diagnosis Date   Acute metabolic encephalopathy 06/20/2021   Alcohol withdrawal seizure (HCC)    Cirrhosis (HCC)    ETOH abuse    Pneumonia 04/02/2020   SBP (spontaneous bacterial peritonitis) (HCC) 06/17/2021    Past Surgical History:  Procedure Laterality Date   COLONOSCOPY WITH PROPOFOL N/A 03/17/2018   Procedure: COLONOSCOPY WITH PROPOFOL;  Surgeon: Bernette Redbird, MD;  Location: [facility] ENDOSCOPY;  Service: Endoscopy;  Laterality: N/A;   ESOPHAGOGASTRODUODENOSCOPY (EGD) WITH PROPOFOL N/A 03/17/2018   Procedure: ESOPHAGOGASTRODUODENOSCOPY (EGD) WITH PROPOFOL;  Surgeon: Bernette Redbird, MD;  Location[facility] ENDOSCOPY;  Service: Endoscopy;  Laterality: N/A;   IR PARACENTESIS  03/14/2018   IR PARACENTESIS  11/18/2020   IR PARACENTESIS  06/17/2021   IR PARACENTESIS  08/30/2021   IR PARACENTESIS  09/02/2021   IR PARACENTESIS  11/16/2021   IR PARACENTESIS  03/01/2022   IR PARACENTESIS  05/31/2022   IR PARACENTESIS  06/02/2022   IR PARACENTESIS  06/23/2022   IR PARACENTESIS  07/01/2022   IR PARACENTESIS  08/18/2022   IR PARACENTESIS  09/26/2022   IR PARACENTESIS  11/07/2022   IR PARACENTESIS  12/07/2022   IR  PARACENTESIS  12/21/2022   IR PARACENTESIS  12/27/2022    Allergies: Patient has no known allergies.  Medications:  Current Facility-Administered Medications:    fentaNYL (DURAGESIC) 12 MCG/HR 1 patch, 1 patch, Transdermal, Q72H, Rhetta Mura, MD   [START ON 12/30/2022] furosemide (LASIX) tablet 40 mg, 40 mg, Oral, Daily, Samtani, Jai-Gurmukh, MD   lactulose (CHRONULAC) 10 GM/15ML solution 30 g, 30 g, Oral, Daily, Doutova, Anastassia, MD, 30 g at 12/29/22 0945   lidocaine (PF) (XYLOCAINE) 1 % injection 10 mL, 10 mL, Infiltration, Once, Covington, Jamie R, NP   midodrine (PROAMATINE) tablet 10 mg, 10 mg, Oral, TID WC, Doutova, Anastassia, MD, 10 mg at 12/29/22 1321   nicotine (NICODERM CQ - dosed in mg/24 hr) patch 7 mg, 7 mg, Transdermal, Daily, Doutova, Anastassia, MD, 7 mg at 12/29/22 0945   ondansetron (ZOFRAN) tablet 4 mg, 4 mg, Oral, Q6H PRN, 4 mg at 12/29/22 1329 **OR** ondansetron (ZOFRAN) injection 4 mg, 4 mg, Intravenous, Q6H PRN, Doutova, Anastassia, MD, 4 mg at 12/27/22 1351   [START ON 12/30/2022] spironolactone (ALDACTONE) tablet 100 mg, 100 mg, Oral, Daily, Rhetta Mura, MD    Family History  Adopted: Yes  Problem Relation Age of Onset   Liver disease Neg Hx    Esophageal cancer Neg Hx    Colon cancer Neg Hx     Social History   Socioeconomic History   Marital status: Single    Spouse name: Not on file   Number of children: 1   Years of education: Not on file   Highest education level: Not on file  Occupational History   Occupation: unemployed  Tobacco Use   Smoking status: Every Day    Current packs/day: 0.25    Average packs/day: 0.3 packs/day for 3.4 years (0.8 ttl pk-yrs)    Types: Cigarettes    Start date: 08/11/2019   Smokeless tobacco: Never  Vaping Use   Vaping status: Never Used  Substance and Sexual Activity   Alcohol use: Yes    Alcohol/week: 2.0 standard drinks of alcohol    Types: 2 Cans of beer per week    Comment: 3-6 beer  cans a week   Drug use: No   Sexual activity: Not Currently  Other Topics Concern   Not on file  Social History Narrative   Not on file   Social Drivers of Health   Financial Resource Strain: Medium Risk (10/26/2022)   Overall Financial Resource Strain (CARDIA)    Difficulty of Paying Living Expenses: Somewhat hard  Food Insecurity: Food Insecurity Present (12/27/2022)   Hunger Vital Sign    Worried About Running Out of Food in the Last Year: Sometimes true    Ran Out of Food in the Last Year: Sometimes true  Transportation Needs: No Transportation Needs (12/27/2022)   PRAPARE - Administrator, Civil Service (Medical): No    Lack of Transportation (Non-Medical): No  Recent Concern: Transportation Needs - Unmet Transportation Needs (10/26/2022)   PRAPARE - Transportation    Lack of Transportation (Medical): Yes    Lack of Transportation (Non-Medical): Yes  Physical Activity: Inactive (10/26/2022)   Exercise Vital Sign    Days of Exercise per Week: 0 days    Minutes of Exercise per Session: 0 min  Stress: No Stress Concern Present (10/26/2022)   [employer] of Occupational Health - Occupational Stress Questionnaire    Feeling of Stress : Not at all  Social Connections: Unknown (10/26/2022)   Social Connection and Isolation Panel [NHANES]    Frequency of Communication with Friends and Family: Once a week    Frequency of Social Gatherings with Friends and Family: Never    Attends Religious Services: Never    Database administrator or Organizations: Yes    Attends Banker Meetings: Never    Marital Status: Patient declined    Review of Systems: A 12 point ROS discussed and pertinent positives are indicated in the HPI above.  All other systems are negative.  Review of Systems  Vital Signs: BP 117/70 (BP Location: Right Arm)   Pulse 97   Temp 98.6 F (37 C) (Oral)   Resp 18   Ht 5\' 5"  (1.651 m)   SpO2 100%   BMI 31.18 kg/m   Physical  Exam Constitutional:      Appearance: He is ill-appearing.  HENT:     Mouth/Throat:     Mouth: Mucous membranes are moist.     Pharynx: Oropharynx is clear.  Abdominal:     General: There is distension.     Palpations: Abdomen is soft. There is no mass.     Tenderness: There is no abdominal tenderness.  Skin:    General: Skin is warm and dry.  Neurological:     General: No focal deficit present.     Mental Status: He is alert and oriented to person, place, and time.  Psychiatric:        Mood and Affect: Mood normal.        Thought Content: Thought content normal.     Imaging: IR Paracentesis Result  Date: 12/28/2022 INDICATION: Patient with a history of cirrhosis with recurrent ascites. Interventional radiology asked to perform a diagnostic and therapeutic paracentesis. EXAM: ULTRASOUND GUIDED PARACENTESIS MEDICATIONS: 1% lidocaine 15 mL COMPLICATIONS: None immediate. PROCEDURE: Informed written consent was obtained from the patient after a discussion of the risks, benefits and alternatives to treatment. A timeout was performed prior to the initiation of the procedure. Initial ultrasound scanning demonstrates a large amount of ascites within the right lower abdominal quadrant. The right lower abdomen was prepped and draped in the usual sterile fashion. 1% lidocaine was used for local anesthesia. Following this, a 19 gauge, 7-cm, Yueh catheter was introduced. An ultrasound image was saved for documentation purposes. The paracentesis was performed. The catheter was removed and a dressing was applied. The patient tolerated the procedure well without immediate post procedural complication. Patient received post-procedure intravenous albumin; see nursing notes for details. FINDINGS: A total of approximately 5.6 L of blood-tinged fluid was removed. Samples were sent to the laboratory as requested by the clinical team. IMPRESSION: Successful ultrasound-guided paracentesis yielding 5.6 liters of  peritoneal fluid. Procedure performed by Alwyn Ren NP PLAN: The patient has previously been formally evaluated by the [facility] Interventional Radiology Portal Hypertension Clinic and is being actively followed for potential future intervention. Electronically Signed   By: Marliss Coots M.D.   On: 12/28/2022 07:43   CT ABDOMEN PELVIS W CONTRAST Result Date: 12/26/2022 CLINICAL DATA:  Acute abdominal pain.  Cirrhosis. EXAM: CT ABDOMEN AND PELVIS WITH CONTRAST TECHNIQUE: Multidetector CT imaging of the abdomen and pelvis was performed using the standard protocol following bolus administration of intravenous contrast. RADIATION DOSE REDUCTION: This exam was performed according to the departmental dose-optimization program which includes automated exposure control, adjustment of the mA and/or kV according to patient size and/or use of iterative reconstruction technique. CONTRAST:  75mL OMNIPAQUE IOHEXOL 350 MG/ML SOLN COMPARISON:  CT abdomen and pelvis 09/26/2022 FINDINGS: Lower chest: There is atelectasis in the lung bases. Hepatobiliary: The liver is small in size with nodular liver contour compatible with cirrhosis. Gallstones are seen. There is no biliary ductal dilatation. Pancreas: Unremarkable. No pancreatic ductal dilatation or surrounding inflammatory changes. Spleen: Mildly enlarged, unchanged. Adrenals/Urinary Tract: Adrenal glands are unremarkable. Kidneys are normal, without renal calculi, focal lesion, or hydronephrosis. Bladder is unremarkable. Stomach/Bowel: Stomach is within normal limits. Appendix appears normal. No evidence of bowel wall thickening, distention, or inflammatory changes. Appendix is not seen. Vascular/Lymphatic: Aorta and IVC are normal in size. Portal vein appears patent. Varices are seen in the right abdomen. No enlarged lymph nodes are identified. Reproductive: Prostate is unremarkable. Scrotal wall edema versus small hydroceles. Other: There is a very large amount of  ascites throughout the abdomen and pelvis which has increased from prior. There is an umbilical hernia containing ascites which has increased in size. There is diffuse body wall edema. There is a small amount of layering hyperdensity in the right paracolic gutter image 3/58 which was not seen on the prior exam. No free intraperitoneal air. Musculoskeletal: No acute or significant osseous findings. IMPRESSION: 1. Very large amount of ascites throughout the abdomen and pelvis which has increased from prior. 2. Small amount of layering hyperdensity in the right paracolic gutter which was not seen on the prior exam. Findings may represent small amount of hemorrhage. 3. 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 4. Cholelithiasis. 5. Diffuse body wall edema. 6. Umbilical hernia containing ascites has increased in size. Electronically Signed   By:  Darliss Cheney M.D.   On: 12/26/2022 20:43   IR Paracentesis Result Date: 12/21/2022 INDICATION: Alcoholic cirrhosis with recurrent ascites EXAM: ULTRASOUND GUIDED LEFT SIDED PARACENTESIS MEDICATIONS: 10 mL 1% lidocaine COMPLICATIONS: None immediate. PROCEDURE: Informed written consent was obtained from the patient after a discussion of the risks, benefits and alternatives to treatment. A timeout was performed prior to the initiation of the procedure. Initial ultrasound scanning demonstrates a large amount of ascites within the left lower abdominal quadrant. The left lower abdomen was prepped and draped in the usual sterile fashion. 1% lidocaine was used for local anesthesia. Following this, a 19 gauge, 7-cm, Yueh catheter was introduced. An ultrasound image was saved for documentation purposes. The paracentesis was performed. The catheter was removed and a dressing was applied. The patient tolerated the procedure well without immediate post procedural complication. Patient received post-procedure intravenous albumin; see nursing  notes for details. FINDINGS: A total of approximately 5 L of clear, yellow  fluid was removed. IMPRESSION: Successful ultrasound-guided paracentesis yielding 5L liters of peritoneal fluid. PLAN: The patient has previously been formally evaluated by the [facility] Interventional Radiology Portal Hypertension Clinic and is being actively followed for potential future intervention. Procedure performed by Philipp Ovens PA-C Electronically Signed   By: Simonne Come M.D.   On: 12/21/2022 16:30   IR Paracentesis Result Date: 12/07/2022 INDICATION: 42 year old male with history of alcoholic cirrhosis and recurrent ascites. Request for therapeutic paracentesis. EXAM: ULTRASOUND GUIDED  PARACENTESIS MEDICATIONS: 10 mL 1% lidocaine COMPLICATIONS: None immediate. PROCEDURE: Informed written consent was obtained from the patient after a discussion of the risks, benefits and alternatives to treatment. A timeout was performed prior to the initiation of the procedure. Initial ultrasound scanning demonstrates a large amount of ascites within the right lower abdominal quadrant. The right lower abdomen was prepped and draped in the usual sterile fashion. 1% lidocaine was used for local anesthesia. Following this, a 19 gauge, 7-cm, Yueh catheter was introduced. An ultrasound image was saved for documentation purposes. The paracentesis was performed. The catheter was removed and a dressing was applied. The patient tolerated the procedure well without immediate post procedural complication. Patient received post-procedure intravenous albumin; see nursing notes for details. FINDINGS: A total of approximately 10 L of hazy yellow fluid was removed. IMPRESSION: Successful ultrasound-guided paracentesis yielding 10 liters of peritoneal fluid. PLAN: The patient has previously been formally evaluated by the [facility] Interventional Radiology Portal Hypertension Clinic and is being actively followed for potential future intervention.  Performed by: Lawernce Ion, PA-C Electronically Signed   By: Gilmer Mor D.O.   On: 12/07/2022 16:19    Labs:  CBC: Recent Labs    12/26/22 1135 12/27/22 0345 12/27/22 1732 12/28/22 0405 12/29/22 0346  WBC 3.9* 3.0*  --  4.2 4.5  HGB 7.7* 6.5* 7.6* 7.9* 8.4*  HCT 22.5* 18.9* 21.1* 22.9* 24.0*  PLT 75* 70*  --  75* 79*    COAGS: Recent Labs    03/01/22 0140 03/02/22 0452 09/26/22 0305 09/27/22 0327 11/29/22 0401 12/26/22 1510 12/27/22 0345 12/28/22 0405 12/29/22 0346  INR 1.8*   < > 2.4*   < > 2.1* 1.8* 2.0* 2.0* 1.9*  APTT 36  --  42*  --  38*  --   --   --   --    < > = values in this interval not displayed.    BMP: Recent Labs    12/27/22 0345 12/27/22 1140 12/28/22 0405 12/29/22 0346  NA 125* 124* 123* 120*  K 4.5 4.7 3.9 3.6  CL 100 102 100 98  CO2 18* 16* 17* 17*  GLUCOSE 123* 101* 104* 128*  BUN 17 17 19 19   CALCIUM 8.1* 8.0* 7.6* 7.4*  CREATININE 0.79 0.85 0.97 1.11  GFRNONAA >60 >60 >60 >60    LIVER FUNCTION TESTS: Recent Labs    12/26/22 1135 12/27/22 0345 12/28/22 0405 12/29/22 0346  BILITOT 4.4* 3.8* 5.7* 3.9*  AST 31 29 26 25   ALT 18 16 15 15   ALKPHOS 109 89 84 95  PROT 5.9* 4.7* 4.8* 4.7*  ALBUMIN 2.9* 2.3* 2.4* 2.4*     Assessment and Plan: End stage EtOH cirrhosis with recurrent large volume ascites. Plan for placement of tunneled peritoneal catheter tomorrow. Hospice to manage catheter after discharge. NPO p MN Labs reviewed. Hgb and PLTs fine. INR at 1.9 secondary to liver dysfunction. Discussed with pt slightly higher bleeding risk but feel acceptable risk to get procedure done. Discussed procedure with pt in detail using video interpreter. All questions answered, pt agrees and has consented.    Electronically Signed: Brayton El, PA-C 12/29/2022, 3:20 PM   I spent a total of 20 minutes in face to face in clinical consultation, greater than 50% of which was counseling/coordinating care for peritoneal PleurX

## 2022-12-29 NOTE — Progress Notes (Signed)
[facility] Liaison Note  Follow up from prior visit. With assistance of Randell Loop interpreter, met with patient and PMT.  Patient is now agreeable to have hospice services in home. We will arrange for hospice nurse to visit and assess DME needs after discharge.   Please send signed and completed DNR home with patient/family. Please provide prescriptions at discharge as needed to ensure ongoing symptom management.   [facility] information and contact numbers given to patient. Above information shared with Transitions of Care Manager. Please call with any questions or concerns.   Thank you for the opportunity to participate in this patient's care.   Glenna Fellows BSN, Charity fundraiser, OCN [employer] [phone]

## 2022-12-29 NOTE — Progress Notes (Signed)
[facility] PROGRESS NOTE  Mathew [name] (DOB: December 12, 1980) JWJ:[id number] PCP: [name], MD  Brief Narrative: Mathew [name] is a 42 y.o. male with a history of decompensated alcoholic cirrhosis with ongoing alcohol use, recurrent ascites, hx SBP, hx esophageal varices, and multiple hospitalizations for complications who presented to the ED on 12/26/2022 with abdominal pain found to have recurrent ascites since last paracentesis on 12/11.  Pallaitve care consulted, now DNR Going home after pleur_x with drain placement  Subjective:   Interviewed with interpreter Some nausea today no cp fever No sob Some abd pain--passing some gas, last stool 2 days ago  Objective: BP 117/70   Pulse 96   Temp 98.6 F (37 C) (Oral)   Resp 18   Ht 5\' 5"  (1.651 m)   SpO2 98%   BMI 31.18 kg/m   Gen:  ill-appearing male in no distress, icteric Pulm: Clear, diminished excursions, nonlabored.  CV: RRR, borderline tachycardia, no MRG, 2+ LE edema GI: Very distended, not significantly tender, +BS, umbilical hernia reducible but some painful--ense ascites Neuro: Alert and oriented, no asterixis. Ext: Warm, dry Skin: No jaundice or open wounds on visualized skin   Assessment & Plan: End-stage alcoholic cirrhosis: Patient has multiple end stage features of this disease and continues to drink. Suspect, based on MELD score, his prognosis is likely limited to weeks-months. EtOH level undetectable at admission.  - Palliative care consulted=== now DNR and needs EOL care and management - Pleurx to be placed in next 24 hours and liekly can d/c home with hospice--pleurx to be managed by hospice once placed -increase aldactone to 100, lasix to 40--stop prophylactic bactrim  Recurrent ascites:  - Repeat paracentesis, he's followed in portal HTN IR clinic. With no fever, leukocytosis, mental status changes, SBP not very highly suspected t - Labs from 5.6L paracentesis are  pending. Pt reports improved pain post procedure. - Continues on midodrine for blood pressure support 10 3 times daily--can d/c at discharge  Hyponatremia: Chronic. With low FENa. ---no further work-up  Pancytopenia, coagulopathy (INR 2.0): With hgb down to 6.5g/dl today. 1u RBCs ordered, no active bleeding or GI symptoms to suggest EV or GI bleed, BUN normal.  - Monitor H/H posttransfusion.  - Anemia panel suggests adequate vitamin/iron stores. Macrocytosis due to liver disease.---stop replacement as philosophy of care now aligned with hospcie  Chronic hypotension:  - Continue midodrine for now as above  Tobacco use: Cessation counseling was provided.  After meeting with palliative care he is now DNR-planning for home with hospice and will reach out to liaison to ensure patient has adequate support  Rhetta Mura, MD [facility] [email or web address] 12/29/2022, 2:05 PM

## 2022-12-29 NOTE — Progress Notes (Signed)
Daily Progress Note   Patient Name: Mathew [name]       Date: 12/29/2022 DOB: [birth year]  Age: 42 y.o. MRN#: [id number] Attending Physician: Rhetta Mura, MD Primary Care Physician: [name], MD Admit Date: 12/26/2022  Reason for Consultation/Follow-up: Establishing goals of care  Subjective: No current complaints, has questions regarding plan of care - see below  Length of Stay: 1  Current Medications: Scheduled Meds:   ferrous sulfate  325 mg Oral Q breakfast   folic acid  1 mg Oral Daily   furosemide  20 mg Oral Daily   lactulose  30 g Oral Daily   lidocaine (PF)  10 mL Infiltration Once   midodrine  10 mg Oral TID WC   nicotine  7 mg Transdermal Daily   spironolactone  50 mg Oral Daily   sulfamethoxazole-trimethoprim  1 tablet Oral Daily    Continuous Infusions:   PRN Meds: fentaNYL (SUBLIMAZE) injection, ondansetron **OR** ondansetron (ZOFRAN) IV  Physical Exam Constitutional:      General: He is not in acute distress.    Appearance: He is ill-appearing.  Pulmonary:     Effort: Pulmonary effort is normal.  Abdominal:     General: There is distension.  Skin:    General: Skin is warm and dry.  Neurological:     Mental Status: He is alert and oriented to person, place, and time.             Vital Signs: BP 101/66 (BP Location: Right Arm)   Pulse 96   Temp 98.6 F (37 C) (Oral)   Resp 18   Ht 5\' 5"  (1.651 m)   SpO2 98%   BMI 31.18 kg/m  SpO2: SpO2: 98 % O2 Device: O2 Device: Room Air O2 Flow Rate:    Intake/output summary:  Intake/Output Summary (Last 24 hours) at 12/29/2022 1019 Last data filed at 12/29/2022 0900 Gross per 24 hour  Intake 440 ml  Output 0 ml  Net 440 ml   LBM:   Baseline Weight:   Most recent weight:          Palliative Assessment/Data:      Patient Active Problem List   Diagnosis Date Noted   Ascites 12/26/2022   Tense ascites 11/28/2022   H/O spontaneous bacterial peritonitis 09/25/2022   Coagulopathy (HCC) 09/25/2022   Metabolic acidosis 09/25/2022   Spontaneous bacterial peritonitis (HCC) 05/31/2022   Alcoholic hepatitis 05/31/2022   GERD (gastroesophageal reflux disease) 05/31/2022   Obesity (BMI 30-39.9) 05/31/2022   Decompensated hepatic cirrhosis (HCC) 03/01/2022   Macrocytic anemia 02/19/2022   Uninsured 11/16/2021   Thrombocytopenia (HCC) 11/15/2021   Alcohol use disorder 09/02/2021   Hyponatremia 09/02/2021   Cirrhosis of liver (HCC) 09/01/2021   Pancytopenia (HCC)    Tobacco abuse    SBP (spontaneous bacterial peritonitis) (HCC) s/t Decompensated Hepatic Cirrhosis 06/17/2021   Umbilical hernia 11/26/2020   Vitamin D deficiency 02/03/2020   Edema due to hypoalbuminemia 12/20/2019   Alcoholic cirrhosis of liver with ascites (HCC) 03/13/2018   Hypokalemia 07/09/2016    Palliative Care Assessment & Plan   HPI:  Mathew [name] is a 42 y.o. male with a history of decompensated alcoholic cirrhosis  with ongoing alcohol use, recurrent ascites, hx SBP, hx esophageal varices, recurrent ascites and multiple hospitalizations for complications .    Palliative care has been asked to get involved to help support additional goals of care conversations.   Assessment: Follow up today with Mathew [name]. Spanish interpreter utilized for entire conversation. Initially reviewed with Mathew [name] conversation yesterday with previous palliative provider. He also tells me about his meeting with hospice liaison. He tells me what is most important is that he not go to a facility - he wants to go home to spend time with family and friends. He is concerned that if he has hospice he will have to be at a facility. We discuss that he can certainly receive hospice care at home and, in fact, I do not  believe he would qualify for care at a hospice facility so it's really not even at option. We review hospice philosophy of care - focusing on comfort, avoiding aggressive medical interventions, avoiding hospitalizations. He agrees this is in line with his goals. We review uncertain prognosis but concern he is nearing end of life due to his liver disease. He understands. He tells me he agrees to go home with hospice support. We discuss option to place abdominal pleurx prior to leave to manage his ascites at home with support of hospice. He agrees to this as well.  Later received message from hospice liaison who reports that patient did not agree to hospice support when they met.   Returned to bedside with hospice liaison and spanish interpreter with [facility] hospice. We again reviewed options of discharging with or without hospice. Detailed each option. Hospice liaison assisted with all hospice related questions. After thorough conversation, patient agrees to go home with hospice support and have drain placed prior to dc. Hospice shares they will be calling brother and updating.   Dr Mahala Menghini updated.    Recommendations/Plan: Planning for dc home with hospice support through [facility]  Plans to place abdominal drain prior to dc Established DNR/DNI Patient shares that his brother Alecia Lemming will help with medical decisions but does not want to complete HCPOA  Code Status: DNR  Care plan was discussed with patient, Dr Wendi Maya hospice liaison Glenna Fellows and Leia Alf (spanish interpreter with [facility])  Thank you for allowing the Palliative Medicine Team to assist in the care of this patient.   Total Time 70 minutes Prolonged Time Billed  yes   Time spent includes: Detailed review of medical records (labs, imaging, vital signs), medically appropriate exam, discussion with treatment team, counseling and educating patient, family and/or staff, documenting clinical information,  medication management and coordination of care.     *Please note that this is a verbal dictation therefore any spelling or grammatical errors are due to the "Dragon Medical One" system interpretation.  Gerlean Ren, DNP, [facility] Palliative Medicine Team Team Phone # [phone]  Pager [phone]

## 2022-12-29 NOTE — Progress Notes (Signed)
Physical Therapy Treatment Patient Details Name: Mathew [name] MRN: [id number] DOB: 23-Jan-1980 Today's Date: 12/29/2022   History of Present Illness Mathew [name] is a 42 y.o. male who presents with abdominal pain, distention; recent paracentiesis on 12/21/22; with medical history significant of alcohol induced cirrhosis, ascites  Pancytopenia, chronic diastolic grade 1 CHF    PT Comments  Increased ambulation distance this session with supervision for safety. Patient continues to have generalized abdominal discomfort with mobility and is fatigued with activity. Vitals stable during session. PT will continue to follow while in the hospital to maximize independence and decrease caregiver burden with no PT needs anticipated after this hospital stay.    If plan is discharge home, recommend the following: Assistance with cooking/housework;Assist for transportation   Can travel by private vehicle        Equipment Recommendations  None recommended by PT    Recommendations for Other Services       Precautions / Restrictions Precautions Precautions: Fall Precaution Comments: abdominal distention Restrictions Weight Bearing Restrictions Per Provider Order: No     Mobility  Bed Mobility Overal bed mobility: Needs Assistance Bed Mobility: Supine to Sit, Sit to Supine     Supine to sit: Supervision Sit to supine: Supervision   General bed mobility comments: increased time required, no physical assistance needed. abdomen is uncomfortable with mobility    Transfers Overall transfer level: Needs assistance Equipment used: None Transfers: Sit to/from Stand Sit to Stand: Supervision           General transfer comment: mild dizziness initially with sitting upright that subsides with increased sitting time    Ambulation/Gait Ambulation/Gait assistance: Supervision Gait Distance (Feet): 120 Feet Assistive device: None Gait Pattern/deviations:  Step-through pattern Gait velocity: decreased     General Gait Details: slow but no loss of balance without device. activity tolerance limited by fatigue. encouraged energy conservation strategies   Stairs             Wheelchair Mobility     Tilt Bed    Modified Rankin (Stroke Patients Only)       Balance Overall balance assessment: Modified Independent                                          Cognition Arousal: Alert Behavior During Therapy: WFL for tasks assessed/performed Overall Cognitive Status: Within Functional Limits for tasks assessed                                 General Comments: patient declined the interpreter during this session and is able to speak sentences in English and follow commands without difficulty        Exercises      General Comments General comments (skin integrity, edema, etc.): vitals stable during session      Pertinent Vitals/Pain Pain Assessment Pain Assessment: Faces Faces Pain Scale: Hurts little more Pain Location: Abdominal pressure and discomfort Pain Descriptors / Indicators: Discomfort, Pressure Pain Intervention(s): Monitored during session, RN gave pain meds during session    Home Living                          Prior Function            PT Goals (current goals can now be found in  the care plan section) Acute Rehab PT Goals Patient Stated Goal: get fluid off of his abdomen if needed PT Goal Formulation: With patient Time For Goal Achievement: 01/10/23 Potential to Achieve Goals: Good Progress towards PT goals: Progressing toward goals    Frequency    Min 1X/week      PT Plan      Co-evaluation              AM-PAC PT "6 Clicks" Mobility   Outcome Measure  Help needed turning from your back to your side while in a flat bed without using bedrails?: None Help needed moving from lying on your back to sitting on the side of a flat bed without using  bedrails?: None Help needed moving to and from a bed to a chair (including a wheelchair)?: None Help needed standing up from a chair using your arms (e.g., wheelchair or bedside chair)?: None Help needed to walk in hospital room?: A Little Help needed climbing 3-5 steps with a railing? : A Little 6 Click Score: 22    End of Session   Activity Tolerance: Patient limited by fatigue;Patient tolerated treatment well Patient left: in bed;with call bell/phone within reach;with nursing/sitter in room (RN in the room) Nurse Communication: Mobility status PT Visit Diagnosis: Other abnormalities of gait and mobility (R26.89)     Time: 1610-9604 PT Time Calculation (min) (ACUTE ONLY): 15 min  Charges:    $Therapeutic Activity: 8-22 mins PT General Charges $$ ACUTE PT VISIT: 1 Visit                     Donna Bernard, PT, MPT    Ina Homes 12/29/2022, 2:09 PM

## 2022-12-30 ENCOUNTER — Inpatient Hospital Stay (HOSPITAL_COMMUNITY): Payer: Self-pay

## 2022-12-30 HISTORY — PX: IR IMAGE GUIDED DRAINAGE PERCUT CATH  PERITONEAL RETROPERIT: IMG5467

## 2022-12-30 LAB — BPAM RBC
Blood Product Expiration Date: 202412232359
Blood Product Expiration Date: 202501012359
ISSUE DATE / TIME: 202412170948
Unit Type and Rh: 5100
Unit Type and Rh: 5100

## 2022-12-30 LAB — CBC
HCT: 22.5 % — ABNORMAL LOW (ref 39.0–52.0)
Hemoglobin: 8.1 g/dL — ABNORMAL LOW (ref 13.0–17.0)
MCH: 35.5 pg — ABNORMAL HIGH (ref 26.0–34.0)
MCHC: 36 g/dL (ref 30.0–36.0)
MCV: 98.7 fL (ref 80.0–100.0)
Platelets: 81 10*3/uL — ABNORMAL LOW (ref 150–400)
RBC: 2.28 MIL/uL — ABNORMAL LOW (ref 4.22–5.81)
RDW: 15.9 % — ABNORMAL HIGH (ref 11.5–15.5)
WBC: 4.5 10*3/uL (ref 4.0–10.5)
nRBC: 0 % (ref 0.0–0.2)

## 2022-12-30 LAB — TYPE AND SCREEN
ABO/RH(D): O POS
Antibody Screen: NEGATIVE
Unit division: 0
Unit division: 0

## 2022-12-30 LAB — COMPREHENSIVE METABOLIC PANEL
ALT: 15 U/L (ref 0–44)
AST: 25 U/L (ref 15–41)
Albumin: 2.3 g/dL — ABNORMAL LOW (ref 3.5–5.0)
Alkaline Phosphatase: 88 U/L (ref 38–126)
Anion gap: 6 (ref 5–15)
BUN: 22 mg/dL — ABNORMAL HIGH (ref 6–20)
CO2: 16 mmol/L — ABNORMAL LOW (ref 22–32)
Calcium: 7.6 mg/dL — ABNORMAL LOW (ref 8.9–10.3)
Chloride: 98 mmol/L (ref 98–111)
Creatinine, Ser: 1.66 mg/dL — ABNORMAL HIGH (ref 0.61–1.24)
GFR, Estimated: 52 mL/min — ABNORMAL LOW (ref >60)
Glucose, Bld: 100 mg/dL — ABNORMAL HIGH (ref 70–99)
Potassium: 4.1 mmol/L (ref 3.5–5.1)
Sodium: 120 mmol/L — ABNORMAL LOW (ref 135–145)
Total Bilirubin: 4.2 mg/dL — ABNORMAL HIGH (ref <1.2)
Total Protein: 4.7 g/dL — ABNORMAL LOW (ref 6.5–8.1)

## 2022-12-30 LAB — BODY FLUID CULTURE W GRAM STAIN
Culture: NO GROWTH
Gram Stain: NONE SEEN

## 2022-12-30 MED ORDER — FENTANYL CITRATE (PF) 100 MCG/2ML IJ SOLN
INTRAMUSCULAR | Status: AC | PRN
  Administered 2022-12-30: 25 ug via INTRAVENOUS
  Administered 2022-12-30: 50 ug via INTRAVENOUS

## 2022-12-30 MED ORDER — LIDOCAINE-EPINEPHRINE 1 %-1:100000 IJ SOLN
INTRAMUSCULAR | Status: AC
  Filled 2022-12-30: qty 1

## 2022-12-30 MED ORDER — MIDAZOLAM HCL 2 MG/2ML IJ SOLN
INTRAMUSCULAR | Status: AC
  Filled 2022-12-30: qty 2

## 2022-12-30 MED ORDER — FENTANYL CITRATE (PF) 100 MCG/2ML IJ SOLN
INTRAMUSCULAR | Status: AC
  Filled 2022-12-30: qty 2

## 2022-12-30 MED ORDER — CEFAZOLIN SODIUM-DEXTROSE 2-4 GM/100ML-% IV SOLN
INTRAVENOUS | Status: AC | PRN
  Administered 2022-12-30: 2 g via INTRAVENOUS

## 2022-12-30 MED ORDER — SODIUM CHLORIDE 0.9 % IV SOLN
20.0000 ug | Freq: Once | INTRAVENOUS | Status: AC
  Administered 2022-12-30: 20 ug via INTRAVENOUS
  Filled 2022-12-30: qty 5

## 2022-12-30 MED ORDER — MIDAZOLAM HCL 2 MG/2ML IJ SOLN
INTRAMUSCULAR | Status: AC | PRN
  Administered 2022-12-30 (×3): 1 mg via INTRAVENOUS

## 2022-12-30 MED ORDER — CEFAZOLIN SODIUM-DEXTROSE 2-4 GM/100ML-% IV SOLN
INTRAVENOUS | Status: AC
  Filled 2022-12-30: qty 100

## 2022-12-30 NOTE — Plan of Care (Signed)
  Problem: Education: Goal: Knowledge of General Education information will improve Description: Including pain rating scale, medication(s)/side effects and non-pharmacologic comfort measures 12/30/2022 1214 by Efraim Kaufmann, RN Outcome: Progressing 12/30/2022 1214 by Efraim Kaufmann, RN Outcome: Progressing   Problem: Health Behavior/Discharge Planning: Goal: Ability to manage health-related needs will improve 12/30/2022 1214 by Efraim Kaufmann, RN Outcome: Progressing 12/30/2022 1214 by Efraim Kaufmann, RN Outcome: Progressing   Problem: Clinical Measurements: Goal: Ability to maintain clinical measurements within normal limits will improve 12/30/2022 1214 by Efraim Kaufmann, RN Outcome: Progressing 12/30/2022 1214 by Efraim Kaufmann, RN Outcome: Progressing Goal: Will remain free from infection 12/30/2022 1214 by Efraim Kaufmann, RN Outcome: Progressing 12/30/2022 1214 by Efraim Kaufmann, RN Outcome: Progressing Goal: Diagnostic test results will improve 12/30/2022 1214 by Efraim Kaufmann, RN Outcome: Progressing 12/30/2022 1214 by Efraim Kaufmann, RN Outcome: Progressing Goal: Respiratory complications will improve 12/30/2022 1214 by Efraim Kaufmann, RN Outcome: Progressing 12/30/2022 1214 by Efraim Kaufmann, RN Outcome: Progressing Goal: Cardiovascular complication will be avoided 12/30/2022 1214 by Efraim Kaufmann, RN Outcome: Progressing 12/30/2022 1214 by Efraim Kaufmann, RN Outcome: Progressing   Problem: Activity: Goal: Risk for activity intolerance will decrease 12/30/2022 1214 by Efraim Kaufmann, RN Outcome: Progressing 12/30/2022 1214 by Efraim Kaufmann, RN Outcome: Progressing   Problem: Nutrition: Goal: Adequate nutrition will be maintained 12/30/2022 1214 by Efraim Kaufmann, RN Outcome: Progressing 12/30/2022 1214 by Efraim Kaufmann, RN Outcome: Progressing   Problem: Coping: Goal: Level of anxiety will  decrease 12/30/2022 1214 by Efraim Kaufmann, RN Outcome: Progressing 12/30/2022 1214 by Efraim Kaufmann, RN Outcome: Progressing   Problem: Elimination: Goal: Will not experience complications related to bowel motility 12/30/2022 1214 by Efraim Kaufmann, RN Outcome: Progressing 12/30/2022 1214 by Efraim Kaufmann, RN Outcome: Progressing Goal: Will not experience complications related to urinary retention 12/30/2022 1214 by Efraim Kaufmann, RN Outcome: Progressing 12/30/2022 1214 by Efraim Kaufmann, RN Outcome: Progressing   Problem: Pain Management: Goal: General experience of comfort will improve 12/30/2022 1214 by Efraim Kaufmann, RN Outcome: Progressing 12/30/2022 1214 by Efraim Kaufmann, RN Outcome: Progressing   Problem: Safety: Goal: Ability to remain free from injury will improve 12/30/2022 1214 by Efraim Kaufmann, RN Outcome: Progressing 12/30/2022 1214 by Efraim Kaufmann, RN Outcome: Progressing   Problem: Skin Integrity: Goal: Risk for impaired skin integrity will decrease 12/30/2022 1214 by Efraim Kaufmann, RN Outcome: Progressing 12/30/2022 1214 by Efraim Kaufmann, RN Outcome: Progressing

## 2022-12-30 NOTE — Procedures (Signed)
Interventional Radiology Procedure Note  Procedure: Placement of a tunneled peritoneal drainage catheter  Complications: None  Estimated Blood Loss: None  Recommendations: - drain daily or PRN   Signed,  Sterling Big, MD

## 2022-12-30 NOTE — Progress Notes (Signed)
[facility] PROGRESS NOTE  Mathew [name] (DOB: [birth year]) ZOX:[id number] PCP: [name], MD  Brief Narrative: Mathew [name] is a 42 y.o. male with a history of decompensated alcoholic cirrhosis with ongoing alcohol use, recurrent ascites, hx SBP, hx esophageal varices, and multiple hospitalizations for complications who presented to the ED on 12/26/2022 with abdominal pain found to have recurrent ascites since last paracentesis on 12/11.  Pallaitve care consulted, now DNR Nearing discharge`  Subjective:   Walked about 600 feet today looks well eating drinking no nausea Going for Pleurx today  Objective: BP (!) 113/55   Pulse 90   Temp 98.5 F (36.9 C)   Resp 12   Ht 5\' 5"  (1.651 m)   SpO2 99%   BMI 31.18 kg/m   Gen:  ill-appearing male in no distress, icteric Pulm: Clear, diminished excursions, nonlabored.  CV: RRR, borderline tachycardia, no MRG, 2+ LE edema GI: Very distended, not significantly tender, +BS, umbilical hernia reducible but some painful--ense ascites Neuro: Alert and oriented, no asterixis. Ext: Warm, dry Skin: No jaundice or open wounds on visualized skin   Assessment & Plan: End-stage alcoholic cirrhosis: Patient has multiple end stage features of this disease and continues to drink. Suspect, based on MELD score, his prognosis is likely limited to weeks-months. EtOH level undetectable at admission.  - Palliative care consulted=== now DNR and needs EOL care and management - Pleurx placed 12/20, -5 L -Would discontinue diuretics at discharge and managed with Pleurx only - Home with hospice AM  Recurrent ascites:  - Repeat paracentesis, he's followed in portal HTN IR clinic. With no fever, leukocytosis, mental status changes, SBP not very highly suspected  -No further workup  Hyponatremia: Chronic. With low FENa. ---no further work-up  Pancytopenia, coagulopathy (INR 2.0): With hgb down to 6.5g/dl today. 1u RBCs  ordered, no active bleeding or GI symptoms to suggest EV or GI bleed, BUN normal.  - Monitor H/H posttransfusion.  - Anemia panel suggests adequate vitamin/iron stores.  Not a candidate for further replacement  Chronic hypotension:  - Continue midodrine for now as above but discontinue at discharge  Tobacco use: Cessation counseling was provided.  He is DNR  we will plan on discharge tomorrow home with hospice Rhetta Mura, MD [facility] [email or web address] 12/30/2022, 3:55 PM

## 2022-12-30 NOTE — Progress Notes (Signed)
Mobility Specialist: Progress Note   12/30/22 1046  Mobility  Activity Ambulated independently in hallway  Level of Assistance Independent  Assistive Device None  Distance Ambulated (ft) 600 ft  Activity Response Tolerated well  Mobility Referral Yes  Mobility visit 1 Mobility  Mobility Specialist Start Time (ACUTE ONLY) [id number]  Mobility Specialist Stop Time (ACUTE ONLY) 0934  Mobility Specialist Time Calculation (min) (ACUTE ONLY) 10 min    Pt was agreeable to mobility session - received in bed. Denies any abdominal pain or discomfort. Returned to room without fault. Left in bed with all needs met, call bell in reach.   Maurene Capes Mobility Specialist Please contact via SecureChat or Rehab office at [phone]

## 2022-12-30 NOTE — Progress Notes (Signed)
Daily Progress Note   Patient Name: Mathew [name]       Date: 12/30/2022 DOB: 1980-01-19  Age: 42 y.o. MRN#: [id number] Attending Physician: Rhetta Mura, MD Primary Care Physician: [name], MD Admit Date: 12/26/2022  Reason for Consultation/Follow-up: Establishing goals of care  Subjective: Hungry as he has been NPO while awaiting abdominal drain otherwise no complaints  Length of Stay: 2  Current Medications: Scheduled Meds:   fentaNYL  1 patch Transdermal Q72H   furosemide  40 mg Oral Daily   lactulose  30 g Oral Daily   lidocaine (PF)  10 mL Infiltration Once   midodrine  10 mg Oral TID WC   nicotine  7 mg Transdermal Daily   spironolactone  100 mg Oral Daily    Continuous Infusions:   ceFAZolin (ANCEF) IV      PRN Meds: ondansetron **OR** [DISCONTINUED] ondansetron (ZOFRAN) IV, prochlorperazine  Physical Exam Constitutional:      General: He is not in acute distress.    Appearance: He is ill-appearing.  Pulmonary:     Effort: Pulmonary effort is normal.  Abdominal:     General: There is distension.  Skin:    General: Skin is warm and dry.  Neurological:     Mental Status: He is alert and oriented to person, place, and time.             Vital Signs: BP (!) 111/53 (BP Location: Left Arm)   Pulse 97   Temp 98.5 F (36.9 C)   Resp 18   Ht 5\' 5"  (1.651 m)   SpO2 99%   BMI 31.18 kg/m  SpO2: SpO2: 99 % O2 Device: O2 Device: Room Air O2 Flow Rate:    Intake/output summary:  Intake/Output Summary (Last 24 hours) at 12/30/2022 1237 Last data filed at 12/30/2022 1226 Gross per 24 hour  Intake 240 ml  Output 220 ml  Net 20 ml   LBM:   Baseline Weight:   Most recent weight:         Palliative Assessment/Data: PPS  50%      Patient Active Problem List   Diagnosis Date Noted   Ascites 12/26/2022   Tense ascites 11/28/2022   H/O spontaneous bacterial peritonitis 09/25/2022   Coagulopathy (HCC) 09/25/2022   Metabolic acidosis 09/25/2022   Spontaneous bacterial peritonitis (HCC) 05/31/2022   Alcoholic hepatitis 05/31/2022   GERD (gastroesophageal reflux disease) 05/31/2022   Obesity (BMI 30-39.9) 05/31/2022   Decompensated hepatic cirrhosis (HCC) 03/01/2022   Macrocytic anemia 02/19/2022   Uninsured 11/16/2021   Thrombocytopenia (HCC) 11/15/2021   Alcohol use disorder 09/02/2021   Hyponatremia 09/02/2021   Cirrhosis of liver (HCC) 09/01/2021   Pancytopenia (HCC)    Tobacco abuse    SBP (spontaneous bacterial peritonitis) (HCC) s/t Decompensated Hepatic Cirrhosis 06/17/2021   Umbilical hernia 11/26/2020   Vitamin D deficiency 02/03/2020   Edema due to hypoalbuminemia 12/20/2019   Alcoholic cirrhosis of liver with ascites (HCC) 03/13/2018   Hypokalemia 07/09/2016    Palliative Care Assessment & Plan   HPI:  Mathew [name] is a 42 y.o. male with a history of decompensated alcoholic cirrhosis with ongoing alcohol use, recurrent ascites, hx SBP, hx  esophageal varices, recurrent ascites and multiple hospitalizations for complications .    Palliative care has been asked to get involved to help support additional goals of care conversations.   Assessment: Follow up today with Mathew [name]. Spanish interpreter utilized for entire conversation. Initially reviewed with [facility] conversation yesterday that included myself and hospice liaison. We reviewed how hospice will support him at home. He remains comfortable with plan to have abdominal drain placed and go home with hospice. He has no questions or concerns today. Hospice has updated his brother.   Recommendations/Plan: Planning for dc home with hospice support through [facility]  Plans to place abdominal drain prior to dc - later  today Established DNR/DNI Patient shares that his brother Mathew [name] will help with medical decisions but does not want to complete HCPOA - hospice has been in touch with brother  Code Status: DNR  Care plan was discussed with patient  Thank you for allowing the Palliative Medicine Team to assist in the care of this patient.   Total Time 25 minutes Prolonged Time Billed  no  Time spent includes: Detailed review of medical records (labs, imaging, vital signs), medically appropriate exam, discussion with treatment team, counseling and educating patient, family and/or staff, documenting clinical information, medication management and coordination of care.     *Please note that this is a verbal dictation therefore any spelling or grammatical errors are due to the "Dragon Medical One" system interpretation.  Gerlean Ren, DNP, [facility] Palliative Medicine Team Team Phone # [phone]  Pager [phone]

## 2022-12-31 DIAGNOSIS — Z515 Encounter for palliative care: Secondary | ICD-10-CM

## 2022-12-31 LAB — COMPREHENSIVE METABOLIC PANEL
ALT: 14 U/L (ref 0–44)
AST: 25 U/L (ref 15–41)
Albumin: 2.1 g/dL — ABNORMAL LOW (ref 3.5–5.0)
Alkaline Phosphatase: 99 U/L (ref 38–126)
Anion gap: 3 — ABNORMAL LOW (ref 5–15)
BUN: 28 mg/dL — ABNORMAL HIGH (ref 6–20)
CO2: 18 mmol/L — ABNORMAL LOW (ref 22–32)
Calcium: 7.4 mg/dL — ABNORMAL LOW (ref 8.9–10.3)
Chloride: 96 mmol/L — ABNORMAL LOW (ref 98–111)
Creatinine, Ser: 1.22 mg/dL (ref 0.61–1.24)
GFR, Estimated: >60 mL/min (ref >60)
Glucose, Bld: 120 mg/dL — ABNORMAL HIGH (ref 70–99)
Potassium: 4.2 mmol/L (ref 3.5–5.1)
Sodium: 117 mmol/L — CL (ref 135–145)
Total Bilirubin: 2.6 mg/dL — ABNORMAL HIGH (ref <1.2)
Total Protein: 4.6 g/dL — ABNORMAL LOW (ref 6.5–8.1)

## 2022-12-31 LAB — CBC
HCT: 22.7 % — ABNORMAL LOW (ref 39.0–52.0)
Hemoglobin: 8 g/dL — ABNORMAL LOW (ref 13.0–17.0)
MCH: 35.1 pg — ABNORMAL HIGH (ref 26.0–34.0)
MCHC: 35.2 g/dL (ref 30.0–36.0)
MCV: 99.6 fL (ref 80.0–100.0)
Platelets: 70 10*3/uL — ABNORMAL LOW (ref 150–400)
RBC: 2.28 MIL/uL — ABNORMAL LOW (ref 4.22–5.81)
RDW: 15.6 % — ABNORMAL HIGH (ref 11.5–15.5)
WBC: 4.3 10*3/uL (ref 4.0–10.5)
nRBC: 0 % (ref 0.0–0.2)

## 2022-12-31 MED ORDER — LACTULOSE 10 GM/15ML PO SOLN
30.0000 g | Freq: Every day | ORAL | 0 refills | Status: DC
  Filled 2023-01-02: qty 236, 5d supply, fill #0

## 2022-12-31 MED ORDER — FUROSEMIDE 40 MG PO TABS
40.0000 mg | ORAL_TABLET | Freq: Every day | ORAL | 0 refills | Status: DC
  Filled 2023-01-02: qty 30, 30d supply, fill #0

## 2022-12-31 MED ORDER — HYDROMORPHONE HCL 1 MG/ML PO LIQD
0.5000 mg | ORAL | Status: DC | PRN
  Administered 2022-12-31: 0.5 mg via ORAL
  Filled 2022-12-31: qty 1

## 2022-12-31 MED ORDER — SPIRONOLACTONE 100 MG PO TABS
100.0000 mg | ORAL_TABLET | Freq: Every day | ORAL | 0 refills | Status: DC
  Filled 2023-01-02 (×2): qty 30, 30d supply, fill #0

## 2022-12-31 MED ORDER — FENTANYL 12 MCG/HR TD PT72
1.0000 | MEDICATED_PATCH | TRANSDERMAL | 0 refills | Status: AC
  Filled 2023-01-02: qty 5, 15d supply, fill #0

## 2022-12-31 MED ORDER — HYDROMORPHONE HCL 1 MG/ML PO LIQD: 1.0000 mg | ORAL | 0 refills | Status: AC | PRN

## 2022-12-31 NOTE — TOC Transition Note (Addendum)
Transition of Care [facility]) - Discharge Note   Patient Details  Name: Mathew [name] MRN: [id number] Date of Birth: [birth year]  Transition of [facility]) CM/SW Contact:  Ronny Bacon, RN Phone Number: 12/31/2022, 10:32 AM   Clinical Narrative:  Patient is being discharged today. [facility] liaison made aware.   1254: [facility] Liaison requested that patient be sent home with two pleurx drainage kits. Floor nurse was walking out with patient, called her to return to inform of patient needs. Unit Secretary to order pleurx catherters and sterile dressing supplies. Form for [employer] medical supplies, to order drainage kits for delivery, taken to provider to complete. Completed form faxed to [facility] medical supplies, attention Pleurx catheter system at [phone].    Final next level of care: Home w Hospice Care Barriers to Discharge: No Barriers Identified   Patient Goals and CMS Choice            Discharge Placement                       Discharge Plan and Services Additional resources added to the After Visit Summary for                                       Social Drivers of Health (SDOH) Interventions SDOH Screenings   Food Insecurity: Food Insecurity Present (12/27/2022)  Housing: Low Risk  (12/27/2022)  Recent Concern: Housing - Medium Risk (11/29/2022)  Transportation Needs: No Transportation Needs (12/27/2022)  Recent Concern: Transportation Needs - Unmet Transportation Needs (10/26/2022)  Utilities: Not At Risk (12/27/2022)  Recent Concern: Utilities - At Risk (11/29/2022)  Depression (PHQ2-9): High Risk (12/05/2022)  Financial Resource Strain: Medium Risk (10/26/2022)  Physical Activity: Inactive (10/26/2022)  Social Connections: Unknown (10/26/2022)  Stress: No Stress Concern Present (10/26/2022)  Tobacco Use: High Risk (12/26/2022)  Health Literacy: Adequate Health Literacy (10/26/2022)     Readmission Risk  Interventions    09/26/2022    2:53 PM 06/06/2022   11:59 AM  Readmission Risk Prevention Plan  Transportation Screening Complete Complete  PCP or Specialist Appt within 3-5 Days  Complete  HRI or Home Care Consult  Complete  Social Work Consult for Recovery Care Planning/Counseling  Complete  Palliative Care Screening  Complete  Medication Review [facility]) Referral to Pharmacy Complete  PCP or Specialist appointment within 3-5 days of discharge Complete   HRI or Home Care Consult Complete   SW Recovery Care/Counseling Consult Complete   Palliative Care Screening Patient Refused   Skilled Nursing Facility Not Applicable

## 2022-12-31 NOTE — Discharge Summary (Signed)
Physician Discharge Summary  Mathew [name] DGL:[id number] DOB: April 08, 1980 DOA: 12/26/2022  PCP: [name], MD  Admit date: 12/26/2022 Discharge date: 12/31/2022  Time spent: 22 minutes  Recommendations for Outpatient Follow-up:  Will need eol care and hospice management at home with hospice agency Signed Rx given to patient at d/c See abdominal Pleurx drainage instructions with regards to how to drain etc.  Discharge Diagnoses:  MAIN problem for hospitalization   End-stage liver disease with cirrhosis  Please see below for itemized issues addressed in HOpsital- refer to other progress notes for clarity if needed  Discharge Condition: Very guarded  Diet recommendation: Comfort  There were no vitals filed for this visit.  History of present illness:  Mathew [name] is a 42 y.o. male with a history of decompensated alcoholic cirrhosis with ongoing alcohol use, recurrent ascites, hx SBP, hx esophageal varices, and multiple hospitalizations for complications who presented to the ED on 12/26/2022 with abdominal pain found to have recurrent ascites since last paracentesis on 12/11.  Pallaitve care consulted, now DNR [facility] Course:  Patient was hospitalized12/16 with worsening abdominal distention and rapid reaccumulation He was seen by multidisciplinary team including palliative care and ultimately decision was made to progress to hospice level care Paracentesis done earlier in hospital stay was not suggestive of SBP and no antibiotics were given His hyponatremia was from hypovolemia and no further workup was planned He had coagulopathy as well and was transfused 1 unit of blood during hospital stay no labs were followed I discontinued his midodrine at discharge He had a Pleurx placed and 5 L were drained ultimately on 12/20 We have prescribed him diuretics at discharge although he may not need to take them given he has a Pleurx  already   He will be discharging into the presence of his relatives and next-door neighbor who will help him in the outpatient setting  Discharge Exam: Vitals:   12/31/22 0505 12/31/22 0940  BP: 105/60 (!) 102/56  Pulse: 95 87  Resp: 18 20  Temp: 98.2 F (36.8 C) 98 F (36.7 C)  SpO2: 100%     Subj on day of d/c   Awake coherent no distress feels swollen no fever no chills  General Exam on discharge  EOMI NCAT no focal deficit no icterus no pallor Tensely swollen ROM intact no focal deficit Power 5/5  Discharge Instructions   Discharge Instructions     Ambulatory Pleural Drainage Schedule   Complete by: As directed    Drain daily, up to max of 1L until patient is only able to drain out . If <133ml for 3 consecutive drains every other day, then call Interventional Radiology [phone]) for evaluation and possible removal.   Diet - low sodium heart healthy   Complete by: As directed    Increase activity slowly   Complete by: As directed    No wound care   Complete by: As directed       Allergies as of 12/31/2022   No Known Allergies      Medication List     STOP taking these medications    FeroSul 325 (65 Fe) MG tablet Generic drug: ferrous sulfate   folic acid 1 MG tablet Commonly known as: FOLVITE   midodrine 10 MG tablet Commonly known as: PROAMATINE   sulfamethoxazole-trimethoprim 800-160 MG tablet Commonly known as: BACTRIM DS       TAKE these medications    fentaNYL 12 MCG/HR Commonly known  as: DURAGESIC Place 1 patch onto the skin every 3 (three) days. Start taking on: January 01, 2023   furosemide 40 MG tablet Commonly known as: LASIX Take 1 tablet (40 mg total) by mouth daily. Start taking on: January 01, 2023 What changed:  medication strength how much to take   HYDROmorphone HCl 1 MG/ML Liqd Commonly known as: DILAUDID Take 1 mL (1 mg total) by mouth every 3 (three) hours as needed for severe pain (pain score  7-10).   lactulose 10 GM/15ML solution Commonly known as: CHRONULAC Take 45 mLs (30 g total) by mouth daily. Start taking on: January 01, 2023 What changed: when to take this   spironolactone 100 MG tablet Commonly known as: ALDACTONE Take 1 tablet (100 mg total) by mouth daily. Start taking on: January 01, 2023 What changed:  medication strength how much to take       No Known Allergies    The results of significant diagnostics from this hospitalization (including imaging, microbiology, ancillary and laboratory) are listed below for reference.    Significant Diagnostic Studies: IR IMAGE GUIDED DRAINAGE PERCUT CATH  PERITONEAL RETROPERIT Result Date: 12/30/2022 INDICATION: 42 year old male with end-stage cirrhosis and refractory large volume ascites. He is not a candidate for tips and is going to hospice. Therefore, he presents for placement of a tunneled peritoneal drainage catheter. EXAM: Tunneled peritoneal drainage catheter. MEDICATIONS: None. ANESTHESIA/SEDATION: Fentanyl 75 mcg IV; Versed 3 mg IV administered intravenously by radiology nursing. Moderate Sedation Time:  12 minutes The patient's vital signs and level of consciousness were continuously monitored during the procedure by the interventional radiology nurse under my direct supervision. COMPLICATIONS: None immediate. PROCEDURE: Informed written consent was obtained from the patient after a thorough discussion of the procedural risks, benefits and alternatives. All questions were addressed. Maximal Sterile Barrier Technique was utilized including caps, mask, sterile gowns, sterile gloves, sterile drape, hand hygiene and skin antiseptic. A timeout was performed prior to the initiation of the procedure. The right lower quadrant was interrogated with ultrasound. There is a large pocket of fluid. The skin was sterilely prepped and draped in the standard fashion using chlorhexidine skin prep. Local anesthesia was attained by  infiltration with 1% lidocaine. A small dermatotomy was made. An 18 gauge sheath needle was carefully advanced through the abdominal wall and into the fluid collection. The needle portion was removed and the sheath left in place. A 0.035 wire was advanced through the sheath and into pelvis. A suitable skin exit site was selected approximately 5 cm from the peritoneal entry site. Local anesthesia was again attained by infiltration with 1% lidocaine. A second dermatotomy was made. The tunneled catheter was then tunneled from the skin exit site to the dermatotomy overlying the peritoneal access site. The access sheath was then removed over the wire. The peel-away sheath was inserted into the peritoneal cavity. The tunneled catheter was advanced through the peel-away sheath and the peel-away sheath was discarded. The dermatotomy over the peritoneal access site was closed with 3 0 Vicryl suture and the epidermis sealed with Dermabond. The catheter was secured at the skin exit site with 0 Prolene suture. IMPRESSION: Successful placement of a tunneled peritoneal drainage catheter via the right lower quadrant. Electronically Signed   By: Malachy Moan M.D.   On: 12/30/2022 16:31   IR Paracentesis Result Date: 12/28/2022 INDICATION: Patient with a history of cirrhosis with recurrent ascites. Interventional radiology asked to perform a diagnostic and therapeutic paracentesis. EXAM: ULTRASOUND GUIDED PARACENTESIS MEDICATIONS: 1%  lidocaine 15 mL COMPLICATIONS: None immediate. PROCEDURE: Informed written consent was obtained from the patient after a discussion of the risks, benefits and alternatives to treatment. A timeout was performed prior to the initiation of the procedure. Initial ultrasound scanning demonstrates a large amount of ascites within the right lower abdominal quadrant. The right lower abdomen was prepped and draped in the usual sterile fashion. 1% lidocaine was used for local anesthesia. Following this,  a 19 gauge, 7-cm, Yueh catheter was introduced. An ultrasound image was saved for documentation purposes. The paracentesis was performed. The catheter was removed and a dressing was applied. The patient tolerated the procedure well without immediate post procedural complication. Patient received post-procedure intravenous albumin; see nursing notes for details. FINDINGS: A total of approximately 5.6 L of blood-tinged fluid was removed. Samples were sent to the laboratory as requested by the clinical team. IMPRESSION: Successful ultrasound-guided paracentesis yielding 5.6 liters of peritoneal fluid. Procedure performed by Alwyn Ren NP PLAN: The patient has previously been formally evaluated by the [facility] Interventional Radiology Portal Hypertension Clinic and is being actively followed for potential future intervention. Electronically Signed   By: Marliss Coots M.D.   On: 12/28/2022 07:43   CT ABDOMEN PELVIS W CONTRAST Result Date: 12/26/2022 CLINICAL DATA:  Acute abdominal pain.  Cirrhosis. EXAM: CT ABDOMEN AND PELVIS WITH CONTRAST TECHNIQUE: Multidetector CT imaging of the abdomen and pelvis was performed using the standard protocol following bolus administration of intravenous contrast. RADIATION DOSE REDUCTION: This exam was performed according to the departmental dose-optimization program which includes automated exposure control, adjustment of the mA and/or kV according to patient size and/or use of iterative reconstruction technique. CONTRAST:  75mL OMNIPAQUE IOHEXOL 350 MG/ML SOLN COMPARISON:  CT abdomen and pelvis 09/26/2022 FINDINGS: Lower chest: There is atelectasis in the lung bases. Hepatobiliary: The liver is small in size with nodular liver contour compatible with cirrhosis. Gallstones are seen. There is no biliary ductal dilatation. Pancreas: Unremarkable. No pancreatic ductal dilatation or surrounding inflammatory changes. Spleen: Mildly enlarged, unchanged. Adrenals/Urinary Tract:  Adrenal glands are unremarkable. Kidneys are normal, without renal calculi, focal lesion, or hydronephrosis. Bladder is unremarkable. Stomach/Bowel: Stomach is within normal limits. Appendix appears normal. No evidence of bowel wall thickening, distention, or inflammatory changes. Appendix is not seen. Vascular/Lymphatic: Aorta and IVC are normal in size. Portal vein appears patent. Varices are seen in the right abdomen. No enlarged lymph nodes are identified. Reproductive: Prostate is unremarkable. Scrotal wall edema versus small hydroceles. Other: There is a very large amount of ascites throughout the abdomen and pelvis which has increased from prior. There is an umbilical hernia containing ascites which has increased in size. There is diffuse body wall edema. There is a small amount of layering hyperdensity in the right paracolic gutter image 3/58 which was not seen on the prior exam. No free intraperitoneal air. Musculoskeletal: No acute or significant osseous findings. IMPRESSION: 1. Very large amount of ascites throughout the abdomen and pelvis which has increased from prior. 2. Small amount of layering hyperdensity in the right paracolic gutter which was not seen on the prior exam. Findings may represent small amount of hemorrhage. 3. 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 4. Cholelithiasis. 5. Diffuse body wall edema. 6. Umbilical hernia containing ascites has increased in size. Electronically Signed   By: Darliss Cheney M.D.   On: 12/26/2022 20:43   IR Paracentesis Result Date: 12/21/2022 INDICATION: Alcoholic cirrhosis with recurrent ascites EXAM: ULTRASOUND GUIDED LEFT SIDED PARACENTESIS  MEDICATIONS: 10 mL 1% lidocaine COMPLICATIONS: None immediate. PROCEDURE: Informed written consent was obtained from the patient after a discussion of the risks, benefits and alternatives to treatment. A timeout was performed prior to the initiation of the procedure.  Initial ultrasound scanning demonstrates a large amount of ascites within the left lower abdominal quadrant. The left lower abdomen was prepped and draped in the usual sterile fashion. 1% lidocaine was used for local anesthesia. Following this, a 19 gauge, 7-cm, Yueh catheter was introduced. An ultrasound image was saved for documentation purposes. The paracentesis was performed. The catheter was removed and a dressing was applied. The patient tolerated the procedure well without immediate post procedural complication. Patient received post-procedure intravenous albumin; see nursing notes for details. FINDINGS: A total of approximately 5 L of clear, yellow  fluid was removed. IMPRESSION: Successful ultrasound-guided paracentesis yielding 5L liters of peritoneal fluid. PLAN: The patient has previously been formally evaluated by the [facility] Interventional Radiology Portal Hypertension Clinic and is being actively followed for potential future intervention. Procedure performed by Philipp Ovens PA-C Electronically Signed   By: Simonne Come M.D.   On: 12/21/2022 16:30   IR Paracentesis Result Date: 12/07/2022 INDICATION: 42 year old male with history of alcoholic cirrhosis and recurrent ascites. Request for therapeutic paracentesis. EXAM: ULTRASOUND GUIDED  PARACENTESIS MEDICATIONS: 10 mL 1% lidocaine COMPLICATIONS: None immediate. PROCEDURE: Informed written consent was obtained from the patient after a discussion of the risks, benefits and alternatives to treatment. A timeout was performed prior to the initiation of the procedure. Initial ultrasound scanning demonstrates a large amount of ascites within the right lower abdominal quadrant. The right lower abdomen was prepped and draped in the usual sterile fashion. 1% lidocaine was used for local anesthesia. Following this, a 19 gauge, 7-cm, Yueh catheter was introduced. An ultrasound image was saved for documentation purposes. The paracentesis was performed.  The catheter was removed and a dressing was applied. The patient tolerated the procedure well without immediate post procedural complication. Patient received post-procedure intravenous albumin; see nursing notes for details. FINDINGS: A total of approximately 10 L of hazy yellow fluid was removed. IMPRESSION: Successful ultrasound-guided paracentesis yielding 10 liters of peritoneal fluid. PLAN: The patient has previously been formally evaluated by the [facility] Interventional Radiology Portal Hypertension Clinic and is being actively followed for potential future intervention. Performed by: Lawernce Ion, PA-C Electronically Signed   By: Gilmer Mor D.O.   On: 12/07/2022 16:19    Microbiology: Recent Results (from the past 240 hours)  Body fluid culture w Gram Stain     Status: None   Collection Time: 12/27/22  3:22 PM   Specimen: Abdomen; Peritoneal Fluid  Result Value Ref Range Status   Specimen Description PERITONEAL  Final   Special Requests NONE  Final   Gram Stain NO WBC SEEN NO ORGANISMS SEEN   Final   Culture   Final    NO GROWTH 3 DAYS Performed at [facility], [address], [place], [place] [address]    Report Status 12/30/2022 FINAL  Final     Labs: Basic Metabolic Panel: Recent Labs  Lab 12/27/22 0057 12/27/22 0345 12/27/22 1140 12/28/22 0405 12/29/22 0346 12/30/22 0548 12/31/22 0232  NA 125* 125* 124* 123* 120* 120* 117*  K 4.6 4.5 4.7 3.9 3.6 4.1 4.2  CL 100 100 102 100 98 98 96*  CO2 20* 18* 16* 17* 17* 16* 18*  GLUCOSE 149* 123* 101* 104* 128* 100* 120*  BUN 16 17 17  19  19 22* 28*  CREATININE 0.96 0.79 0.85 0.97 1.11 1.66* 1.22  CALCIUM 8.3* 8.1* 8.0* 7.6* 7.4* 7.6* 7.4*  MG 2.2 2.1  --   --   --   --   --   PHOS 4.3 4.2  --   --   --   --   --    Liver Function Tests: Recent Labs  Lab 12/27/22 0345 12/28/22 0405 12/29/22 0346 12/30/22 0548 12/31/22 0232  AST 29 26 25 25 25   ALT 16 15 15 15 14   ALKPHOS 89 84 95 88 99  BILITOT 3.8* 5.7*  3.9* 4.2* 2.6*  PROT 4.7* 4.8* 4.7* 4.7* 4.6*  ALBUMIN 2.3* 2.4* 2.4* 2.3* 2.1*   Recent Labs  Lab 12/26/22 1135  LIPASE 61*   Recent Labs  Lab 12/27/22 0057  AMMONIA 67*   CBC: Recent Labs  Lab 12/27/22 0345 12/27/22 1732 12/28/22 0405 12/29/22 0346 12/30/22 0548 12/31/22 0232  WBC 3.0*  --  4.2 4.5 4.5 4.3  HGB 6.5* 7.6* 7.9* 8.4* 8.1* 8.0*  HCT 18.9* 21.1* 22.9* 24.0* 22.5* 22.7*  MCV 103.3*  --  100.0 99.6 98.7 99.6  PLT 70*  --  75* 79* 81* 70*   Cardiac Enzymes: Recent Labs  Lab 12/27/22 0057  CKTOTAL 44*   BNP: BNP (last 3 results) Recent Labs    02/28/22 1240  BNP 34.3    ProBNP (last 3 results) No results for input(s): "PROBNP" in the last 8760 hours.  CBG: No results for input(s): "GLUCAP" in the last 168 hours.     Signed:  Rhetta Mura MD   [facility] 12/31/2022, 10:07 AM

## 2022-12-31 NOTE — Plan of Care (Signed)
  Problem: Education: Goal: Knowledge of General Education information will improve Description: Including pain rating scale, medication(s)/side effects and non-pharmacologic comfort measures 12/31/2022 1125 by Irwin Brakeman, RN Outcome: Adequate for Discharge 12/31/2022 1125 by Irwin Brakeman, RN Outcome: Progressing   Problem: Health Behavior/Discharge Planning: Goal: Ability to manage health-related needs will improve 12/31/2022 1125 by Irwin Brakeman, RN Outcome: Adequate for Discharge 12/31/2022 1125 by Irwin Brakeman, RN Outcome: Progressing   Problem: Clinical Measurements: Goal: Ability to maintain clinical measurements within normal limits will improve 12/31/2022 1125 by Irwin Brakeman, RN Outcome: Adequate for Discharge 12/31/2022 1125 by Irwin Brakeman, RN Outcome: Progressing Goal: Will remain free from infection 12/31/2022 1125 by Irwin Brakeman, RN Outcome: Adequate for Discharge 12/31/2022 1125 by Irwin Brakeman, RN Outcome: Progressing Goal: Diagnostic test results will improve 12/31/2022 1125 by Irwin Brakeman, RN Outcome: Adequate for Discharge 12/31/2022 1125 by Irwin Brakeman, RN Outcome: Progressing Goal: Respiratory complications will improve 12/31/2022 1125 by Irwin Brakeman, RN Outcome: Adequate for Discharge 12/31/2022 1125 by Irwin Brakeman, RN Outcome: Progressing Goal: Cardiovascular complication will be avoided 12/31/2022 1125 by Irwin Brakeman, RN Outcome: Adequate for Discharge 12/31/2022 1125 by Irwin Brakeman, RN Outcome: Progressing   Problem: Activity: Goal: Risk for activity intolerance will decrease 12/31/2022 1125 by Irwin Brakeman, RN Outcome: Adequate for Discharge 12/31/2022 1125 by Irwin Brakeman, RN Outcome: Progressing   Problem: Nutrition: Goal: Adequate nutrition will be maintained 12/31/2022  1125 by Irwin Brakeman, RN Outcome: Adequate for Discharge 12/31/2022 1125 by Irwin Brakeman, RN Outcome: Progressing   Problem: Coping: Goal: Level of anxiety will decrease 12/31/2022 1125 by Irwin Brakeman, RN Outcome: Adequate for Discharge 12/31/2022 1125 by Irwin Brakeman, RN Outcome: Progressing   Problem: Elimination: Goal: Will not experience complications related to bowel motility 12/31/2022 1125 by Irwin Brakeman, RN Outcome: Adequate for Discharge 12/31/2022 1125 by Irwin Brakeman, RN Outcome: Progressing Goal: Will not experience complications related to urinary retention 12/31/2022 1125 by Irwin Brakeman, RN Outcome: Adequate for Discharge 12/31/2022 1125 by Irwin Brakeman, RN Outcome: Progressing   Problem: Pain Management: Goal: General experience of comfort will improve 12/31/2022 1125 by Irwin Brakeman, RN Outcome: Adequate for Discharge 12/31/2022 1125 by Irwin Brakeman, RN Outcome: Progressing   Problem: Safety: Goal: Ability to remain free from injury will improve 12/31/2022 1125 by Irwin Brakeman, RN Outcome: Adequate for Discharge 12/31/2022 1125 by Irwin Brakeman, RN Outcome: Progressing   Problem: Skin Integrity: Goal: Risk for impaired skin integrity will decrease 12/31/2022 1125 by Irwin Brakeman, RN Outcome: Adequate for Discharge 12/31/2022 1125 by Irwin Brakeman, RN Outcome: Progressing  Under Hospice Care at home

## 2022-12-31 NOTE — Progress Notes (Signed)
Per SW awaiting supplies for pleurix. Midodrine not given as not ordered for home.

## 2022-12-31 NOTE — Progress Notes (Signed)
DISCHARGE NOTE HOME Jakeith Demont [name] to be discharged Home per MD order. Discussed prescriptions and follow up appointments with the patient. Prescriptions given to patient; medication list explained in detail. Patient verbalized understanding.  Skin clean, dry and intact without evidence of skin break down, no evidence of skin tears noted. IV catheter discontinued intact. Site without signs and symptoms of complications. Dressing and pressure applied. Pt denies pain at the site currently. No complaints noted.  Pt has pleurix catheter and MD says he is aware of how to drain and hospice services will assist pt. No draining supplies were sent with pt.  An After Visit Summary (AVS) was printed and given to the patient. Patient escorted via wheelchair, and discharged home via private auto.  Irwin Brakeman, RN

## 2022-12-31 NOTE — Progress Notes (Signed)
Pt has supplies. SW arranged and pt now leaving in private vehicle. Escorted in w/c by staff

## 2023-01-02 ENCOUNTER — Other Ambulatory Visit (HOSPITAL_COMMUNITY): Payer: Self-pay

## 2023-01-02 ENCOUNTER — Other Ambulatory Visit: Payer: Self-pay

## 2023-01-02 MED ORDER — HYDROMORPHONE HCL 1 MG/ML PO LIQD
1.0000 mL | ORAL | 0 refills | Status: AC | PRN
  Filled 2023-01-02: qty 56, 7d supply, fill #0

## 2023-01-03 ENCOUNTER — Other Ambulatory Visit: Payer: Self-pay

## 2023-01-04 ENCOUNTER — Inpatient Hospital Stay (HOSPITAL_COMMUNITY)
Admission: EM | Admit: 2023-01-04 | Discharge: 2023-01-09 | DRG: 640 | Disposition: A | Discharge status: Hospice | Payer: Self-pay | Attending: Family Medicine | Admitting: Family Medicine

## 2023-01-04 ENCOUNTER — Encounter (HOSPITAL_COMMUNITY): Payer: Self-pay

## 2023-01-04 DIAGNOSIS — Z79899 Other long term (current) drug therapy: Secondary | ICD-10-CM

## 2023-01-04 DIAGNOSIS — D693 Immune thrombocytopenic purpura: Secondary | ICD-10-CM | POA: Insufficient documentation

## 2023-01-04 DIAGNOSIS — E871 Hypo-osmolality and hyponatremia: Principal | ICD-10-CM | POA: Diagnosis present

## 2023-01-04 DIAGNOSIS — E875 Hyperkalemia: Secondary | ICD-10-CM | POA: Diagnosis present

## 2023-01-04 DIAGNOSIS — R000 Tachycardia, unspecified: Secondary | ICD-10-CM | POA: Diagnosis present

## 2023-01-04 DIAGNOSIS — R188 Other ascites: Secondary | ICD-10-CM | POA: Diagnosis present

## 2023-01-04 DIAGNOSIS — D649 Anemia, unspecified: Secondary | ICD-10-CM

## 2023-01-04 DIAGNOSIS — D72810 Lymphocytopenia: Secondary | ICD-10-CM | POA: Diagnosis present

## 2023-01-04 DIAGNOSIS — N179 Acute kidney failure, unspecified: Secondary | ICD-10-CM | POA: Diagnosis present

## 2023-01-04 DIAGNOSIS — K767 Hepatorenal syndrome: Secondary | ICD-10-CM | POA: Diagnosis present

## 2023-01-04 DIAGNOSIS — R278 Other lack of coordination: Secondary | ICD-10-CM | POA: Diagnosis present

## 2023-01-04 DIAGNOSIS — Z5986 Financial insecurity: Secondary | ICD-10-CM

## 2023-01-04 DIAGNOSIS — Z515 Encounter for palliative care: Secondary | ICD-10-CM

## 2023-01-04 DIAGNOSIS — K7030 Alcoholic cirrhosis of liver without ascites: Secondary | ICD-10-CM | POA: Diagnosis present

## 2023-01-04 DIAGNOSIS — Z5982 Transportation insecurity: Secondary | ICD-10-CM

## 2023-01-04 DIAGNOSIS — D6959 Other secondary thrombocytopenia: Secondary | ICD-10-CM | POA: Diagnosis present

## 2023-01-04 DIAGNOSIS — Z560 Unemployment, unspecified: Secondary | ICD-10-CM

## 2023-01-04 DIAGNOSIS — K7040 Alcoholic hepatic failure without coma: Secondary | ICD-10-CM | POA: Diagnosis present

## 2023-01-04 DIAGNOSIS — Z5941 Food insecurity: Secondary | ICD-10-CM

## 2023-01-04 DIAGNOSIS — M79604 Pain in right leg: Secondary | ICD-10-CM | POA: Diagnosis present

## 2023-01-04 DIAGNOSIS — R509 Fever, unspecified: Secondary | ICD-10-CM | POA: Diagnosis present

## 2023-01-04 DIAGNOSIS — F1090 Alcohol use, unspecified, uncomplicated: Secondary | ICD-10-CM

## 2023-01-04 DIAGNOSIS — R197 Diarrhea, unspecified: Secondary | ICD-10-CM | POA: Diagnosis not present

## 2023-01-04 DIAGNOSIS — K7210 Chronic hepatic failure without coma: Secondary | ICD-10-CM

## 2023-01-04 DIAGNOSIS — Z8619 Personal history of other infectious and parasitic diseases: Secondary | ICD-10-CM

## 2023-01-04 DIAGNOSIS — F17210 Nicotine dependence, cigarettes, uncomplicated: Secondary | ICD-10-CM | POA: Diagnosis present

## 2023-01-04 DIAGNOSIS — K7031 Alcoholic cirrhosis of liver with ascites: Secondary | ICD-10-CM | POA: Diagnosis present

## 2023-01-04 DIAGNOSIS — D638 Anemia in other chronic diseases classified elsewhere: Secondary | ICD-10-CM | POA: Diagnosis present

## 2023-01-04 DIAGNOSIS — E872 Acidosis, unspecified: Secondary | ICD-10-CM | POA: Diagnosis present

## 2023-01-04 DIAGNOSIS — F1010 Alcohol abuse, uncomplicated: Secondary | ICD-10-CM | POA: Diagnosis present

## 2023-01-04 DIAGNOSIS — Z66 Do not resuscitate: Secondary | ICD-10-CM | POA: Diagnosis present

## 2023-01-04 DIAGNOSIS — Z8701 Personal history of pneumonia (recurrent): Secondary | ICD-10-CM

## 2023-01-04 DIAGNOSIS — E8770 Fluid overload, unspecified: Secondary | ICD-10-CM | POA: Diagnosis present

## 2023-01-04 DIAGNOSIS — K766 Portal hypertension: Secondary | ICD-10-CM | POA: Diagnosis present

## 2023-01-04 DIAGNOSIS — G8929 Other chronic pain: Secondary | ICD-10-CM | POA: Diagnosis present

## 2023-01-04 LAB — CBC WITH DIFFERENTIAL/PLATELET
Abs Immature Granulocytes: 0.03 10*3/uL (ref 0.00–0.07)
Basophils Absolute: 0 10*3/uL (ref 0.0–0.1)
Basophils Relative: 1 %
Eosinophils Absolute: 0.1 10*3/uL (ref 0.0–0.5)
Eosinophils Relative: 2 %
HCT: 23.9 % — ABNORMAL LOW (ref 39.0–52.0)
Hemoglobin: 8.3 g/dL — ABNORMAL LOW (ref 13.0–17.0)
Immature Granulocytes: 1 %
Lymphocytes Relative: 11 %
Lymphs Abs: 0.5 10*3/uL — ABNORMAL LOW (ref 0.7–4.0)
MCH: 34.6 pg — ABNORMAL HIGH (ref 26.0–34.0)
MCHC: 34.7 g/dL (ref 30.0–36.0)
MCV: 99.6 fL (ref 80.0–100.0)
Monocytes Absolute: 0.6 10*3/uL (ref 0.1–1.0)
Monocytes Relative: 13 %
Neutro Abs: 3.2 10*3/uL (ref 1.7–7.7)
Neutrophils Relative %: 72 %
Platelets: 67 10*3/uL — ABNORMAL LOW (ref 150–400)
RBC: 2.4 MIL/uL — ABNORMAL LOW (ref 4.22–5.81)
RDW: 14.2 % (ref 11.5–15.5)
WBC: 4.4 10*3/uL (ref 4.0–10.5)
nRBC: 0 % (ref 0.0–0.2)

## 2023-01-04 LAB — COMPREHENSIVE METABOLIC PANEL
ALT: 23 U/L (ref 0–44)
AST: 43 U/L — ABNORMAL HIGH (ref 15–41)
Albumin: 2.5 g/dL — ABNORMAL LOW (ref 3.5–5.0)
Alkaline Phosphatase: 145 U/L — ABNORMAL HIGH (ref 38–126)
Anion gap: 7 (ref 5–15)
BUN: 32 mg/dL — ABNORMAL HIGH (ref 6–20)
CO2: 17 mmol/L — ABNORMAL LOW (ref 22–32)
Calcium: 8.1 mg/dL — ABNORMAL LOW (ref 8.9–10.3)
Chloride: 92 mmol/L — ABNORMAL LOW (ref 98–111)
Creatinine, Ser: 0.82 mg/dL (ref 0.61–1.24)
GFR, Estimated: >60 mL/min (ref >60)
Glucose, Bld: 133 mg/dL — ABNORMAL HIGH (ref 70–99)
Potassium: 5 mmol/L (ref 3.5–5.1)
Sodium: 116 mmol/L — CL (ref 135–145)
Total Bilirubin: 3 mg/dL — ABNORMAL HIGH (ref <1.2)
Total Protein: 5.4 g/dL — ABNORMAL LOW (ref 6.5–8.1)

## 2023-01-04 MED ORDER — THIAMINE MONONITRATE 100 MG PO TABS
100.0000 mg | ORAL_TABLET | Freq: Every day | ORAL | Status: DC
  Administered 2023-01-06 – 2023-01-07 (×2): 100 mg via ORAL
  Filled 2023-01-04 (×4): qty 1

## 2023-01-04 MED ORDER — ALBUMIN HUMAN 25 % IV SOLN
50.0000 g | INTRAVENOUS | Status: DC
  Filled 2023-01-04: qty 200

## 2023-01-04 MED ORDER — THIAMINE HCL 100 MG/ML IJ SOLN
100.0000 mg | Freq: Every day | INTRAMUSCULAR | Status: DC
  Administered 2023-01-05: 100 mg via INTRAVENOUS
  Filled 2023-01-04 (×2): qty 2

## 2023-01-04 MED ORDER — SODIUM CHLORIDE 0.9% FLUSH
3.0000 mL | INTRAVENOUS | Status: DC | PRN
Start: 2023-01-04 — End: 2023-01-05

## 2023-01-04 MED ORDER — SODIUM CHLORIDE 0.9 % IV SOLN
INTRAVENOUS | Status: DC
Start: 2023-01-04 — End: 2023-01-05

## 2023-01-04 MED ORDER — IBUPROFEN 400 MG PO TABS: 400.0000 mg | ORAL_TABLET | Freq: Four times a day (QID) | ORAL | Status: DC | PRN

## 2023-01-04 MED ORDER — SODIUM CHLORIDE 0.9 % IV SOLN
250.0000 mL | INTRAVENOUS | Status: DC | PRN
Start: 2023-01-04 — End: 2023-01-05

## 2023-01-04 MED ORDER — FOLIC ACID 1 MG PO TABS
1.0000 mg | ORAL_TABLET | Freq: Every day | ORAL | Status: DC
  Administered 2023-01-05 – 2023-01-07 (×3): 1 mg via ORAL
  Filled 2023-01-04 (×4): qty 1

## 2023-01-04 MED ORDER — ONDANSETRON HCL 4 MG/2ML IJ SOLN
4.0000 mg | Freq: Once | INTRAMUSCULAR | Status: AC
  Administered 2023-01-04: 4 mg via INTRAVENOUS
  Filled 2023-01-04: qty 2

## 2023-01-04 MED ORDER — SODIUM CHLORIDE 0.9% FLUSH: 3.0000 mL | Freq: Two times a day (BID) | INTRAVENOUS | Status: DC

## 2023-01-04 MED ORDER — ADULT MULTIVITAMIN W/MINERALS CH
1.0000 | ORAL_TABLET | Freq: Every day | ORAL | Status: DC
  Administered 2023-01-05 – 2023-01-07 (×3): 1 via ORAL
  Filled 2023-01-04 (×4): qty 1

## 2023-01-04 MED ORDER — LORAZEPAM 2 MG/ML IJ SOLN: 1.0000 mg | INTRAMUSCULAR | Status: DC | PRN

## 2023-01-04 MED ORDER — LACTULOSE 10 GM/15ML PO SOLN
30.0000 g | Freq: Every day | ORAL | Status: DC
Start: 2023-01-05 — End: 2023-01-05

## 2023-01-04 MED ORDER — HEPARIN SODIUM (PORCINE) 5000 UNIT/ML IJ SOLN
5000.0000 [IU] | Freq: Two times a day (BID) | INTRAMUSCULAR | Status: DC
  Administered 2023-01-05 – 2023-01-08 (×7): 5000 [IU] via SUBCUTANEOUS
  Filled 2023-01-04 (×7): qty 1

## 2023-01-04 MED ORDER — LORAZEPAM 1 MG PO TABS: 1.0000 mg | ORAL_TABLET | ORAL | Status: DC | PRN

## 2023-01-04 MED ORDER — FENTANYL 12 MCG/HR TD PT72
1.0000 | MEDICATED_PATCH | TRANSDERMAL | Status: DC
  Administered 2023-01-05 – 2023-01-08 (×2): 1 via TRANSDERMAL
  Filled 2023-01-04: qty 1

## 2023-01-04 MED ORDER — HEPARIN SODIUM (PORCINE) 5000 UNIT/ML IJ SOLN: 5000.0000 [IU] | Freq: Three times a day (TID) | INTRAMUSCULAR | Status: DC

## 2023-01-04 MED ORDER — MORPHINE SULFATE (PF) 4 MG/ML IV SOLN
4.0000 mg | Freq: Once | INTRAVENOUS | Status: AC
  Administered 2023-01-04: 4 mg via INTRAVENOUS
  Filled 2023-01-04: qty 1

## 2023-01-04 MED ORDER — DOCUSATE SODIUM 100 MG PO CAPS
100.0000 mg | ORAL_CAPSULE | Freq: Two times a day (BID) | ORAL | Status: DC
  Administered 2023-01-05 – 2023-01-08 (×6): 100 mg via ORAL
  Filled 2023-01-04 (×8): qty 1

## 2023-01-04 MED ORDER — HYDROMORPHONE HCL 2 MG PO TABS
1.0000 mg | ORAL_TABLET | ORAL | Status: DC | PRN
  Administered 2023-01-05 – 2023-01-07 (×5): 1 mg via ORAL
  Filled 2023-01-04 (×6): qty 1

## 2023-01-04 NOTE — ED Provider Notes (Signed)
[place] EMERGENCY DEPARTMENT AT [facility] Provider Note  CSN: [id number] Arrival date & time: 01/04/23 2042  Chief Complaint(s) Abdominal Pain and Leg Pain (right)  HPI Mathew [name] is a 42 y.o. male history of alcoholic cirrhosis, on hospice presenting with abdominal pain and weakness.  Patient reports ongoing alcohol use, reports that he is having generalized weakness.  He is having leg cramps.  He also reports ongoing abdominal distention.  He has a Pleurx placed during recent hospitalization, has been having drainage      Past Medical History Past Medical History:  Diagnosis Date   Acute metabolic encephalopathy 06/20/2021   Alcohol withdrawal seizure (HCC)    Cirrhosis (HCC)    ETOH abuse    Pneumonia 04/02/2020   SBP (spontaneous bacterial peritonitis) (HCC) 06/17/2021   Patient Active Problem List   Diagnosis Date Noted   End stage liver disease (HCC) 01/04/2023   Chronic anemia 01/04/2023   Chronic idiopathic thrombocytopenia (HCC) 01/04/2023   Hospice care patient 12/31/2022   Ascites 12/26/2022   Tense ascites 11/28/2022   H/O spontaneous bacterial peritonitis 09/25/2022   Coagulopathy (HCC) 09/25/2022   Metabolic acidosis 09/25/2022   Spontaneous bacterial peritonitis (HCC) 05/31/2022   Alcoholic hepatitis 05/31/2022   GERD (gastroesophageal reflux disease) 05/31/2022   Obesity (BMI 30-39.9) 05/31/2022   Decompensated hepatic cirrhosis (HCC) 03/01/2022   Macrocytic anemia 02/19/2022   Uninsured 11/16/2021   Thrombocytopenia (HCC) 11/15/2021   Alcohol use disorder 09/02/2021   Acute on chronic hyponatremia 09/02/2021   Cirrhosis of liver (HCC) 09/01/2021   Pancytopenia (HCC)    Tobacco abuse    SBP (spontaneous bacterial peritonitis) (HCC) s/t Decompensated Hepatic Cirrhosis 06/17/2021   Umbilical hernia 11/26/2020   Vitamin D deficiency 02/03/2020   Edema due to hypoalbuminemia 12/20/2019   Alcoholic cirrhosis (HCC)  03/13/2018   Hypokalemia 07/09/2016   Home Medication(s) Prior to Admission medications   Medication Sig Start Date End Date Taking? Authorizing Provider  HYDROmorphone (DILAUDID) 2 MG tablet Take 2 mg by mouth every 3 (three) hours as needed. 01/02/23  Yes [provider]  traZODone (DESYREL) 50 MG tablet Take 50 mg by mouth daily as needed. 01/02/23  Yes [provider]  fentaNYL (DURAGESIC) 12 MCG/HR Place 1 patch onto the skin every 3 (three) days. 01/01/23   Rhetta Mura, MD  furosemide (LASIX) 40 MG tablet Take 1 tablet (40 mg total) by mouth daily. 01/01/23   Rhetta Mura, MD  HYDROmorphone HCl (DILAUDID) 1 MG/ML LIQD Take 1 mL (1 mg total) by mouth every 3 (three) hours as needed for severe pain (pain score 7-10). 12/31/22   Rhetta Mura, MD  HYDROmorphone HCl (DILAUDID) 1 MG/ML LIQD Take 1 mL (1 mg total) by mouth every 3 (three) hours as needed. 12/31/22   Rhetta Mura, MD  lactulose (CHRONULAC) 10 GM/15ML solution Take 45 mLs (30 g total) by mouth daily. 01/01/23  Yes Rhetta Mura, MD  spironolactone (ALDACTONE) 100 MG tablet Take 1 tablet (100 mg total) by mouth daily. 01/01/23  Yes Rhetta Mura, MD  Past Surgical History Past Surgical History:  Procedure Laterality Date   COLONOSCOPY WITH PROPOFOL N/A 03/17/2018   Procedure: COLONOSCOPY WITH PROPOFOL;  Surgeon: Bernette Redbird, MD;  Location: [facility] ENDOSCOPY;  Service: Endoscopy;  Laterality: N/A;   ESOPHAGOGASTRODUODENOSCOPY (EGD) WITH PROPOFOL N/A 03/17/2018   Procedure: ESOPHAGOGASTRODUODENOSCOPY (EGD) WITH PROPOFOL;  Surgeon: Bernette Redbird, MD;  Location: [facility] ENDOSCOPY;  Service: Endoscopy;  Laterality: N/A;   IR IMAGE GUIDED DRAINAGE PERCUT CATH  PERITONEAL RETROPERIT  12/30/2022   IR PARACENTESIS  03/14/2018   IR PARACENTESIS  11/18/2020    IR PARACENTESIS  06/17/2021   IR PARACENTESIS  08/30/2021   IR PARACENTESIS  09/02/2021   IR PARACENTESIS  11/16/2021   IR PARACENTESIS  03/01/2022   IR PARACENTESIS  05/31/2022   IR PARACENTESIS  06/02/2022   IR PARACENTESIS  06/23/2022   IR PARACENTESIS  07/01/2022   IR PARACENTESIS  08/18/2022   IR PARACENTESIS  09/26/2022   IR PARACENTESIS  11/07/2022   IR PARACENTESIS  12/07/2022   IR PARACENTESIS  12/21/2022   IR PARACENTESIS  12/27/2022   Family History Family History  Adopted: Yes  Problem Relation Age of Onset   Liver disease Neg Hx    Esophageal cancer Neg Hx    Colon cancer Neg Hx     Social History Social History   Tobacco Use   Smoking status: Every Day    Current packs/day: 0.25    Average packs/day: 0.3 packs/day for 3.4 years (0.9 ttl pk-yrs)    Types: Cigarettes    Start date: 08/11/2019   Smokeless tobacco: Never  Vaping Use   Vaping status: Never Used  Substance Use Topics   Alcohol use: Yes    Alcohol/week: 2.0 standard drinks of alcohol    Types: 2 Cans of beer per week    Comment: 3-6 beer cans a week   Drug use: No   Allergies Patient has no known allergies.  Review of Systems Review of Systems  All other systems reviewed and are negative.   Physical Exam Vital Signs  I have reviewed the triage vital signs BP 126/84 (BP Location: Right Arm)   Pulse 96   Temp 97.8 F (36.6 C) (Oral)   Resp 20   Ht 5\' 5"  (1.651 m)   Wt 79.4 kg   SpO2 100%   BMI 29.12 kg/m  Physical Exam Vitals and nursing note reviewed.  Constitutional:      General: He is not in acute distress.    Appearance: Normal appearance.  HENT:     Mouth/Throat:     Mouth: Mucous membranes are moist.  Eyes:     General: Scleral icterus present.     Conjunctiva/sclera: Conjunctivae normal.  Cardiovascular:     Rate and Rhythm: Normal rate and regular rhythm.  Pulmonary:     Effort: Pulmonary effort is normal. No respiratory distress.     Breath sounds: Normal breath sounds.   Abdominal:     General: Abdomen is flat and protuberant. There is distension.     Palpations: Abdomen is soft. There is fluid wave.     Tenderness: There is no abdominal tenderness.     Hernia: A hernia is present. Hernia is present in the umbilical area.  Musculoskeletal:     Right lower leg: No edema.     Left lower leg: No edema.  Skin:    General: Skin is warm and dry.     Capillary Refill: Capillary refill takes less than 2 seconds.  Neurological:     Mental Status: He is alert and oriented to person, place, and time. Mental status is at baseline.  Psychiatric:        Mood and Affect: Mood normal.        Behavior: Behavior normal.     ED Results and Treatments Labs (all labs ordered are listed, but only abnormal results are displayed) Labs Reviewed  COMPREHENSIVE METABOLIC PANEL - Abnormal; Notable for the following components:      Result Value   Sodium 116 (*)    Chloride 92 (*)    CO2 17 (*)    Glucose, Bld 133 (*)    BUN 32 (*)    Calcium 8.1 (*)    Total Protein 5.4 (*)    Albumin 2.5 (*)    AST 43 (*)    Alkaline Phosphatase 145 (*)    Total Bilirubin 3.0 (*)    All other components within normal limits  CBC WITH DIFFERENTIAL/PLATELET - Abnormal; Notable for the following components:   RBC 2.40 (*)    Hemoglobin 8.3 (*)    HCT 23.9 (*)    MCH 34.6 (*)    Platelets 67 (*)    Lymphs Abs 0.5 (*)    All other components within normal limits  OSMOLALITY  AMMONIA  PROTIME-INR  CBC  COMPREHENSIVE METABOLIC PANEL  OSMOLALITY, URINE  SODIUM, URINE, RANDOM  SODIUM                                                                                                                          Radiology No results found.  Pertinent labs & imaging results that were available during my care of the patient were reviewed by me and considered in my medical decision making (see MDM for details).  Medications Ordered in ED Medications  HYDROmorphone HCl (DILAUDID)  liquid 1 mg (has no administration in time range)  fentaNYL (DURAGESIC) 12 MCG/HR 1 patch (has no administration in time range)  lactulose (CHRONULAC) 10 GM/15ML solution 30 g (has no administration in time range)  heparin injection 5,000 Units (has no administration in time range)  sodium chloride flush (NS) 0.9 % injection 3 mL (has no administration in time range)  sodium chloride flush (NS) 0.9 % injection 3 mL (has no administration in time range)  0.9 %  sodium chloride infusion (has no administration in time range)  ibuprofen (ADVIL) tablet 400 mg (has no administration in time range)  docusate sodium (COLACE) capsule 100 mg (has no administration in time range)  LORazepam (ATIVAN) tablet 1-4 mg (has no administration in time range)    Or  LORazepam (ATIVAN) injection 1-4 mg (has no administration in time range)  thiamine (VITAMIN B1) tablet 100 mg (has no administration in time range)    Or  thiamine (VITAMIN B1) injection 100 mg (has no administration in time range)  folic acid (FOLVITE) tablet 1 mg (has no administration in time range)  multivitamin with minerals tablet  1 tablet (has no administration in time range)  0.9 %  sodium chloride infusion (has no administration in time range)  morphine (PF) 4 MG/ML injection 4 mg (4 mg Intravenous Given 01/04/23 2157)  ondansetron (ZOFRAN) injection 4 mg (4 mg Intravenous Given 01/04/23 2157)                                                                                                                                     Procedures .Critical Care  Performed by: Lonell Grandchild, MD Authorized by: Lonell Grandchild, MD   Critical care provider statement:    Critical care time (minutes):  30   Critical care was time spent personally by me on the following activities:  Development of treatment plan with patient or surrogate, discussions with consultants, evaluation of patient's response to treatment, examination of patient,  ordering and review of laboratory studies, ordering and review of radiographic studies, ordering and performing treatments and interventions, pulse oximetry, re-evaluation of patient's condition and review of old charts   Care discussed with: admitting provider     (including critical care time)  Medical Decision Making / ED Course   MDM:  42 year old male presenting to the emergency department with weakness, abdominal pain.  On exam, he has significant abdominal distention.  He has no peritoneal signs.  He has been having some muscle cramps as well.  He appears chronically ill.  Labs notable for worsening hyponatremia of 116.  He was apparently discharged recently with hyponatremia but it is getting lower.  He does have some chronic hyponatremia but this seems worse than baseline.  This could be contributing to his muscle cramping.  He also has abdominal distention causing discomfort.  He has no peritoneal signs to suggest SBP at this time.  Given worsening hyponatremia discussed with the hospitalist will admit the patient.      Additional history obtained: -Additional history obtained from ems -External records from outside source obtained and reviewed including: Chart review including previous notes, labs, imaging, consultation notes including d/c summary    Lab Tests: -I ordered, reviewed, and interpreted labs.   The pertinent results include:   Labs Reviewed  COMPREHENSIVE METABOLIC PANEL - Abnormal; Notable for the following components:      Result Value   Sodium 116 (*)    Chloride 92 (*)    CO2 17 (*)    Glucose, Bld 133 (*)    BUN 32 (*)    Calcium 8.1 (*)    Total Protein 5.4 (*)    Albumin 2.5 (*)    AST 43 (*)    Alkaline Phosphatase 145 (*)    Total Bilirubin 3.0 (*)    All other components within normal limits  CBC WITH DIFFERENTIAL/PLATELET - Abnormal; Notable for the following components:   RBC 2.40 (*)    Hemoglobin 8.3 (*)    HCT 23.9 (*)    MCH 34.6  (*)  Platelets 67 (*)    Lymphs Abs 0.5 (*)    All other components within normal limits  OSMOLALITY  AMMONIA  PROTIME-INR  CBC  COMPREHENSIVE METABOLIC PANEL  OSMOLALITY, URINE  SODIUM, URINE, RANDOM  SODIUM    Notable for hyponatremia, anemia    Medicines ordered and prescription drug management: Meds ordered this encounter  Medications   morphine (PF) 4 MG/ML injection 4 mg   ondansetron (ZOFRAN) injection 4 mg   HYDROmorphone HCl (DILAUDID) liquid 1 mg   fentaNYL (DURAGESIC) 12 MCG/HR 1 patch   lactulose (CHRONULAC) 10 GM/15ML solution 30 g   heparin injection 5,000 Units   sodium chloride flush (NS) 0.9 % injection 3 mL   sodium chloride flush (NS) 0.9 % injection 3 mL   0.9 %  sodium chloride infusion   ibuprofen (ADVIL) tablet 400 mg   docusate sodium (COLACE) capsule 100 mg   OR Linked Order Group    LORazepam (ATIVAN) tablet 1-4 mg     CIWA-AR < 5 =:   0 mg     CIWA-AR 5 -10 =:   1 mg     CIWA-AR 11 -15 =:   2 mg     CIWA-AR 16 -20 =:   3 mg     CIWA-AR 16 -20 =:   Recheck CIWA-AR in 1 hour; if > 20 notify MD     CIWA-AR > 20 =:   4 mg     CIWA-AR > 20 =:   Call Rapid Response    LORazepam (ATIVAN) injection 1-4 mg     CIWA-AR < 5 =:   0 mg     CIWA-AR 5 -10 =:   1 mg     CIWA-AR 11 -15 =:   2 mg     CIWA-AR 16 -20 =:   3 mg     CIWA-AR 16 -20 =:   Recheck CIWA-AR in 1 hour; if > 20 notify MD     CIWA-AR > 20 =:   4 mg     CIWA-AR > 20 =:   Call Rapid Response   OR Linked Order Group    thiamine (VITAMIN B1) tablet 100 mg    thiamine (VITAMIN B1) injection 100 mg   folic acid (FOLVITE) tablet 1 mg   multivitamin with minerals tablet 1 tablet   0.9 %  sodium chloride infusion    -I have reviewed the patients home medicines and have made adjustments as needed   Consultations Obtained: I requested consultation with the hospitalist,  and discussed lab and imaging findings as well as pertinent plan - they recommend: admission   Social Determinants of  Health:  Diagnosis or treatment significantly limited by social determinants of health: alcohol use   Reevaluation: After the interventions noted above, I reevaluated the patient and found that their symptoms have improved  Co morbidities that complicate the patient evaluation  Past Medical History:  Diagnosis Date   Acute metabolic encephalopathy 06/20/2021   Alcohol withdrawal seizure (HCC)    Cirrhosis (HCC)    ETOH abuse    Pneumonia 04/02/2020   SBP (spontaneous bacterial peritonitis) (HCC) 06/17/2021      Dispostion: Disposition decision including need for hospitalization was considered, and patient discharged from emergency department.    Final Clinical Impression(s) / ED Diagnoses Final diagnoses:  Hyponatremia     This chart was dictated using voice recognition software.  Despite best efforts to proofread,  errors can occur which can change the  documentation meaning.    Lonell Grandchild, MD 01/04/23 [id number]

## 2023-01-04 NOTE — ED Triage Notes (Addendum)
WALLEE spanish interpreter used to assist with triage   Pt to ED via [facility] c/o abdominal pain, hx of liver cirrhosis with paracentesis yesterday, reports 1 Liter fluid removed. Pt also c/o  right leg pain. Reports last drink yesterday.  Abdominal distention noted  Last VS: 150/80, 111HR, 100%RA, 18RR

## 2023-01-04 NOTE — H&P (Addendum)
History and Physical    Mathew [name] ZOX:[id number] DOB: 21-Apr-1980 DOA: 01/04/2023  PCP: [name], MD   Patient coming from: Home   Chief Complaint:  Chief Complaint  Patient presents with   Abdominal Pain   Leg Pain    right   ED TRIAGE note:Pt to ED via [facility] c/o abdominal pain, hx of liver cirrhosis with paracentesis yesterday, reports 1 Liter fluid removed. Pt also c/o  right leg pain. Reports last drink yesterday.   Abdominal distention noted  Last VS: 150/80, 111HR, 100%RA, 18RR  HPI:  Mathew [name] is a 43 y.o. 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  Last drink yesterday 12/24. Patient reported generalized abdominal pain which is chronic in nature.  However he is endorsing generalized weakness. Patient is currently on hospice care stated that on daily basis he has almost 1 L of output through the abdominal drain. Currently patient is on hospice care for end-stage liver disease and hospice nurse coming to patient so managing and draining the abdominal Pleurx catheter.  Patient is also DNR and DNI based on palliative discussion during last hospital admission.   ED Course:   At presentation to ED patient is hemodynamically stable. CMP showing low sodium 116, low chloride 92, low bicarb 17, BUN 32, low albumin 2.5, transaminitis and hyperbilirubinemia which is chronic in nature. CBC showing stable H&H 8.3 and 23, low RBC count which is  chronic in nature and chronic thrombocytopenia.  Hospitalist is contacted for further evaluation management of acute on chronic hyponatremia and generalized weakness.   Significant labs in the ED: Lab Orders         Comprehensive metabolic panel         CBC with Differential         Osmolality         Ammonia         Protime-INR         CBC         Comprehensive metabolic panel         Osmolality, urine         Sodium, urine, random         Sodium       Review of Systems:  Review of Systems  Constitutional:  Positive for malaise/fatigue. Negative for chills and fever.  Respiratory:  Negative for cough and shortness of breath.   Cardiovascular:  Negative for chest pain and palpitations.  Gastrointestinal:  Positive for abdominal pain. Negative for constipation, diarrhea, nausea and vomiting.  Musculoskeletal:  Negative for back pain, falls and joint pain.       Bilateral lower extremities pain  Neurological:  Negative for dizziness and headaches.  Endo/Heme/Allergies:  Does not bruise/bleed easily.  Psychiatric/Behavioral:  The patient is not nervous/anxious.     Past Medical History:  Diagnosis Date   Acute metabolic encephalopathy 06/20/2021   Alcohol withdrawal seizure (HCC)    Cirrhosis (HCC)    ETOH abuse    Pneumonia 04/02/2020   SBP (spontaneous bacterial peritonitis) (HCC) 06/17/2021    Past Surgical History:  Procedure Laterality Date   COLONOSCOPY  WITH PROPOFOL N/A 03/17/2018   Procedure: COLONOSCOPY WITH PROPOFOL;  Surgeon: Bernette Redbird, MD;  Location: [facility] ENDOSCOPY;  Service: Endoscopy;  Laterality: N/A;   ESOPHAGOGASTRODUODENOSCOPY (EGD) WITH PROPOFOL N/A 03/17/2018   Procedure: ESOPHAGOGASTRODUODENOSCOPY (EGD) WITH PROPOFOL;  Surgeon: Bernette Redbird, MD;  Location[facility] ENDOSCOPY;  Service: Endoscopy;  Laterality: N/A;   IR IMAGE GUIDED DRAINAGE PERCUT CATH  PERITONEAL RETROPERIT  12/30/2022   IR PARACENTESIS  03/14/2018   IR PARACENTESIS  11/18/2020   IR  PARACENTESIS  06/17/2021   IR PARACENTESIS  08/30/2021   IR PARACENTESIS  09/02/2021   IR PARACENTESIS  11/16/2021   IR PARACENTESIS  03/01/2022   IR PARACENTESIS  05/31/2022   IR PARACENTESIS  06/02/2022   IR PARACENTESIS  06/23/2022   IR PARACENTESIS  07/01/2022   IR PARACENTESIS  08/18/2022   IR PARACENTESIS  09/26/2022   IR PARACENTESIS  11/07/2022   IR PARACENTESIS  12/07/2022   IR PARACENTESIS  12/21/2022   IR PARACENTESIS  12/27/2022     reports that he has been smoking cigarettes. He started smoking about 3 years ago. He has a 0.9 pack-year smoking history. He has never used smokeless tobacco. He reports current alcohol use of about 2.0 standard drinks of alcohol per week. He reports that he does not use drugs.  No Known Allergies  Family History  Adopted: Yes  Problem Relation Age of Onset   Liver disease Neg Hx    Esophageal cancer Neg Hx    Colon cancer Neg Hx     Prior to Admission medications   Medication Sig Start Date End Date Taking? Authorizing Provider  fentaNYL (DURAGESIC) 12 MCG/HR Place 1 patch onto the skin every 3 (three) days. 01/01/23   Rhetta Mura, MD  furosemide (LASIX) 40 MG tablet Take 1 tablet (40 mg total) by mouth daily. 01/01/23   Rhetta Mura, MD  HYDROmorphone HCl (DILAUDID) 1 MG/ML LIQD Take 1 mL (1 mg total) by mouth every 3 (three) hours as needed for severe pain (pain score 7-10). 12/31/22   Rhetta Mura, MD  HYDROmorphone HCl (DILAUDID) 1 MG/ML LIQD Take 1 mL (1 mg total) by mouth every 3 (three) hours as needed. 12/31/22   Rhetta Mura, MD  lactulose (CHRONULAC) 10 GM/15ML solution Take 45 mLs (30 g total) by mouth daily. 01/01/23   Rhetta Mura, MD  spironolactone (ALDACTONE) 100 MG tablet Take 1 tablet (100 mg total) by mouth daily. 01/01/23   Rhetta Mura, MD     Physical Exam: Vitals:   01/04/23 2055 01/04/23 2056  BP:  126/84  Pulse:  96  Resp:  20  Temp:  97.8 F (36.6 C)  TempSrc:  Oral   SpO2:  100%  Weight: 79.4 kg   Height: 5\' 5"  (1.651 m)     Physical Exam Constitutional:      General: He is not in acute distress.    Appearance: He is ill-appearing.  Pulmonary:     Effort: Pulmonary effort is normal.  Abdominal:     General: Abdomen is protuberant. There is distension.     Palpations: Abdomen is soft. There is shifting dullness and fluid wave.     Tenderness: There is abdominal tenderness.  Skin:    General: Skin is dry.     Capillary Refill: Capillary refill takes less than 2 seconds.  Neurological:     Mental Status: He is oriented to person, place, and time.  Psychiatric:        Mood and Affect: Mood  normal. Mood is not anxious.      Labs on Admission: I have personally reviewed following labs and imaging studies  CBC: Recent Labs  Lab 12/29/22 0346 12/30/22 0548 12/31/22 0232 01/04/23 2217  WBC 4.5 4.5 4.3 4.4  NEUTROABS  --   --   --  3.2  HGB 8.4* 8.1* 8.0* 8.3*  HCT 24.0* 22.5* 22.7* 23.9*  MCV 99.6 98.7 99.6 99.6  PLT 79* 81* 70* 67*   Basic Metabolic Panel: Recent Labs  Lab 12/29/22 0346 12/30/22 0548 12/31/22 0232 01/04/23 2217  NA 120* 120* 117* 116*  K 3.6 4.1 4.2 5.0  CL 98 98 96* 92*  CO2 17* 16* 18* 17*  GLUCOSE 128* 100* 120* 133*  BUN 19 22* 28* 32*  CREATININE 1.11 1.66* 1.22 0.82  CALCIUM 7.4* 7.6* 7.4* 8.1*   GFR: Estimated Creatinine Clearance: 111.7 mL/min (by C-G formula based on SCr of 0.82 mg/dL). Liver Function Tests: Recent Labs  Lab 12/29/22 0346 12/30/22 0548 12/31/22 0232 01/04/23 2217  AST 25 25 25  43*  ALT 15 15 14 23   ALKPHOS 95 88 99 145*  BILITOT 3.9* 4.2* 2.6* 3.0*  PROT 4.7* 4.7* 4.6* 5.4*  ALBUMIN 2.4* 2.3* 2.1* 2.5*   No results for input(s): "LIPASE", "AMYLASE" in the last 168 hours. No results for input(s): "AMMONIA" in the last 168 hours. Coagulation Profile: Recent Labs  Lab 12/29/22 0346  INR 1.9*   Cardiac Enzymes: No results for input(s): "CKTOTAL", "CKMB",  "CKMBINDEX", "TROPONINI", "TROPONINIHS" in the last 168 hours. BNP (last 3 results) Recent Labs    02/28/22 1240  BNP 34.3   HbA1C: No results for input(s): "HGBA1C" in the last 72 hours. CBG: No results for input(s): "GLUCAP" in the last 168 hours. Lipid Profile: No results for input(s): "CHOL", "HDL", "LDLCALC", "TRIG", "CHOLHDL", "LDLDIRECT" in the last 72 hours. Thyroid Function Tests: No results for input(s): "TSH", "T4TOTAL", "FREET4", "T3FREE", "THYROIDAB" in the last 72 hours. Anemia Panel: No results for input(s): "VITAMINB12", "FOLATE", "FERRITIN", "TIBC", "IRON", "RETICCTPCT" in the last 72 hours. Urine analysis:    Component Value Date/Time   COLORURINE AMBER (A) 12/26/2022 2056   APPEARANCEUR CLEAR 12/26/2022 2056   LABSPEC 1.027 12/26/2022 2056   PHURINE 6.0 12/26/2022 2056   GLUCOSEU NEGATIVE 12/26/2022 2056   HGBUR MODERATE (A) 12/26/2022 2056   BILIRUBINUR NEGATIVE 12/26/2022 2056   KETONESUR NEGATIVE 12/26/2022 2056   PROTEINUR NEGATIVE 12/26/2022 2056   UROBILINOGEN 0.2 08/02/2014 2045   NITRITE NEGATIVE 12/26/2022 2056   LEUKOCYTESUR NEGATIVE 12/26/2022 2056    Radiological Exams on Admission: I have personally reviewed images No results found.     Assessment/Plan: Principal Problem:   Acute on chronic hyponatremia Active Problems:   Alcoholic cirrhosis (HCC)   Alcohol use disorder   End stage liver disease (HCC)   Chronic anemia   Chronic idiopathic thrombocytopenia (HCC)    Assessment and Plan: Acute on chronic hyponatremia > Patient presented emergency department complaining of abdominal pain, generalized weakness and bilateral lower extremities pain. -CMP showing low sodium 116.  Baseline sodium around 120-124. -Checking serum osmolarity, urine Osmo and urine sodium - Physical exam showing significant abdominal and umbilical distention with fluid thrill and shifting dullness. -Acute on chronic hyponatremia in the setting of volume  overload even though patient is having paracentesis and Pleurx catheter has been draining on daily basis at home.  Patient reported not taking Lasix and Aldactone at home. -Checking ammonia, pro time and INR level. - Starting NS 50  cc/hour for 1 day, continue monitor serum sodium level every 4 hours and also requested for ultrasound-guided paracentesis. -Holding Lasix spironolactone in the setting of severe hyponatremia. -Continue to monitor sodium level and after paracentesis will follow-up with sodium level to decide further treatment plan.   Chronic alcohol use -Reported last drink yesterday 12/24.  Reported drinking only half glass of alcohol, patient did not specify what he has been drinking. - Continue to monitor CIWA and if patient shows significant withdrawal we will start Ativan as needed - Continue multivitamin, folic acid and thiamine.  Generalized weakness -Generalized weakness secondary to physical deconditioning from end-stage hepatic cirrhosis and hyponatremia. - Continue fall precaution. - Consulted PT and OT for evaluation of balance and requirement of DME  Bilateral lower extremities pain - Patient is complaining about bilateral lower extremities pain.  Physical exam did not showed any swelling and unequal calf swelling. - Given patient has history of hepatic cirrhosis and concern for coagulopathy checking DVT ultrasound to rule out any blood clot.  Non-anion gap metabolic acidosis -Low bicarb 17.  Normal anion gap.  An order metabolic acidosis in the setting of chronic decompensated hepatic cirrhosis.  Continue to monitor.   End-stage liver disease due to alcoholic cirrhosis Abdominal pain secondary to ascites -Patient has multiple hospital admission secondary to worsening abdominal distention and rapid accumulation of ascites.  Underwent multiple paracentesis and currently undergoing outpatient paracentesis as well.  During last hospitalization abdomen Pleurx catheter  has been placed and home health with hospice care has been initiated for management of Pleurx catheter.  Patient reported hospice nurse draining almost 1 L of fluid and patient also had outpatient paracentesis on 12/24 even though patient presenting with significant abdominal distention and fluid recommendation. - Obtaining ultrasound-guided paracentesis goal to remove 2 L of fluid and will give albumin 50 g. -Patient reported not taking Lasix and spironolactone at home.  Holding Lasix per lactone for now in the setting of hyponatremia will resume after paracentesis based on the serum sodium level. - Continue fentanyl patch every 72 hour and Dilaudid 1 mg every 3 hour as needed for severe pain. - Need to resume hospice care on discharge for abdominal Pleurx catheter/drain management.  Anemia of chronic disease -Stable H&H 8.3 and 23.  Continue to monitor.  Thrombocytopenia secondary to chronic alcohol use -Low platelets 67.  Continue subcu heparin 5000 twice daily for DVT prophylaxis.  Continue to monitor platelet count and development of any bleeding.   DVT prophylaxis:  SQ Heparin, SCD and TED hose. Code Status:  DNR/DNI(Do NOT Intubate) Diet: Heart healthy diet with fluid restriction 1.2 L. Family Communication:   Currently no family member at bedside Disposition Plan: Pending improvement of sodium level and obtaining paracentesis. Consults: PT and OT Admission status:   Observation, Step Down Unit  Severity of Illness: The appropriate patient status for this patient is OBSERVATION. Observation status is judged to be reasonable and necessary in order to provide the required intensity of service to ensure the patient's safety. The patient's presenting symptoms, physical exam findings, and initial radiographic and laboratory data in the context of their medical condition is felt to place them at decreased risk for further clinical deterioration. Furthermore, it is anticipated that the patient  will be medically stable for discharge from the hospital within 2 midnights of admission.     Tereasa Coop, MD [facility]  How to contact the [facility] Attending or Consulting provider 7A - 7P or covering provider during after hours  7P -7A, for this patient.  Check the care team in [facility] and look for a) attending/consulting TRH provider listed and b) the [facility] team listed Log into [email or web address] and use [place]'s universal password to access. If you do not have the password, please contact the hospital operator. Locate the [facility] provider you are looking for under [facility] and page to a number that you can be directly reached. If you still have difficulty reaching the provider, please page the [facility] (Director on Call) for the Hospitalists listed on amion for assistance.  01/05/2023, 12:04 AM

## 2023-01-05 ENCOUNTER — Observation Stay (HOSPITAL_COMMUNITY): Payer: Self-pay

## 2023-01-05 ENCOUNTER — Other Ambulatory Visit: Payer: Self-pay

## 2023-01-05 ENCOUNTER — Other Ambulatory Visit (HOSPITAL_COMMUNITY): Payer: Self-pay

## 2023-01-05 ENCOUNTER — Observation Stay (HOSPITAL_COMMUNITY)

## 2023-01-05 DIAGNOSIS — D649 Anemia, unspecified: Secondary | ICD-10-CM

## 2023-01-05 DIAGNOSIS — K7210 Chronic hepatic failure without coma: Secondary | ICD-10-CM

## 2023-01-05 DIAGNOSIS — K7031 Alcoholic cirrhosis of liver with ascites: Secondary | ICD-10-CM

## 2023-01-05 DIAGNOSIS — K7682 Hepatic encephalopathy: Secondary | ICD-10-CM

## 2023-01-05 DIAGNOSIS — E875 Hyperkalemia: Secondary | ICD-10-CM

## 2023-01-05 DIAGNOSIS — E871 Hypo-osmolality and hyponatremia: Secondary | ICD-10-CM

## 2023-01-05 DIAGNOSIS — M7989 Other specified soft tissue disorders: Secondary | ICD-10-CM

## 2023-01-05 HISTORY — PX: IR RADIOLOGIST EVAL & MGMT: IMG5224

## 2023-01-05 LAB — SODIUM, URINE, RANDOM: Sodium, Ur: <10 mmol/L

## 2023-01-05 LAB — CBC
HCT: 24.2 % — ABNORMAL LOW (ref 39.0–52.0)
Hemoglobin: 8.7 g/dL — ABNORMAL LOW (ref 13.0–17.0)
MCH: 35.5 pg — ABNORMAL HIGH (ref 26.0–34.0)
MCHC: 36 g/dL (ref 30.0–36.0)
MCV: 98.8 fL (ref 80.0–100.0)
Platelets: 64 10*3/uL — ABNORMAL LOW (ref 150–400)
RBC: 2.45 MIL/uL — ABNORMAL LOW (ref 4.22–5.81)
RDW: 14.3 % (ref 11.5–15.5)
WBC: 4.2 10*3/uL (ref 4.0–10.5)
nRBC: 0 % (ref 0.0–0.2)

## 2023-01-05 LAB — COMPREHENSIVE METABOLIC PANEL
ALT: 24 U/L (ref 0–44)
AST: 43 U/L — ABNORMAL HIGH (ref 15–41)
Albumin: 2.4 g/dL — ABNORMAL LOW (ref 3.5–5.0)
Alkaline Phosphatase: 135 U/L — ABNORMAL HIGH (ref 38–126)
Anion gap: 6 (ref 5–15)
BUN: 31 mg/dL — ABNORMAL HIGH (ref 6–20)
CO2: 18 mmol/L — ABNORMAL LOW (ref 22–32)
Calcium: 8 mg/dL — ABNORMAL LOW (ref 8.9–10.3)
Chloride: 92 mmol/L — ABNORMAL LOW (ref 98–111)
Creatinine, Ser: 0.83 mg/dL (ref 0.61–1.24)
GFR, Estimated: >60 mL/min (ref >60)
Glucose, Bld: 117 mg/dL — ABNORMAL HIGH (ref 70–99)
Potassium: 5.4 mmol/L — ABNORMAL HIGH (ref 3.5–5.1)
Sodium: 116 mmol/L — CL (ref 135–145)
Total Bilirubin: 3.4 mg/dL — ABNORMAL HIGH (ref <1.2)
Total Protein: 5.3 g/dL — ABNORMAL LOW (ref 6.5–8.1)

## 2023-01-05 LAB — GLUCOSE, CAPILLARY
Glucose-Capillary: 118 mg/dL — ABNORMAL HIGH (ref 70–99)
Glucose-Capillary: 114 mg/dL — ABNORMAL HIGH (ref 70–99)
Glucose-Capillary: 108 mg/dL — ABNORMAL HIGH (ref 70–99)

## 2023-01-05 LAB — OSMOLALITY, URINE: Osmolality, Ur: 766 mosm/kg (ref 300–900)

## 2023-01-05 LAB — URIC ACID: Uric Acid, Serum: 6.5 mg/dL (ref 3.7–8.6)

## 2023-01-05 LAB — PROTIME-INR
INR: 1.5 — ABNORMAL HIGH (ref 0.8–1.2)
Prothrombin Time: 18.8 s — ABNORMAL HIGH (ref 11.4–15.2)

## 2023-01-05 LAB — AMMONIA: Ammonia: 53 umol/L — ABNORMAL HIGH (ref 9–35)

## 2023-01-05 LAB — OSMOLALITY: Osmolality: 257 mosm/kg — ABNORMAL LOW (ref 275–295)

## 2023-01-05 MED ORDER — RIFAXIMIN 550 MG PO TABS
550.0000 mg | ORAL_TABLET | Freq: Two times a day (BID) | ORAL | Status: DC
  Administered 2023-01-05 – 2023-01-09 (×8): 550 mg via ORAL
  Filled 2023-01-05 (×9): qty 1

## 2023-01-05 MED ORDER — HYDROMORPHONE HCL 1 MG/ML IJ SOLN
1.0000 mg | INTRAMUSCULAR | Status: DC | PRN
  Administered 2023-01-05 – 2023-01-08 (×6): 1 mg via INTRAVENOUS
  Filled 2023-01-05 (×7): qty 1

## 2023-01-05 MED ORDER — SODIUM CHLORIDE 0.9 % IV SOLN
2.0000 g | INTRAVENOUS | Status: DC
  Administered 2023-01-05 – 2023-01-08 (×4): 2 g via INTRAVENOUS
  Filled 2023-01-05 (×4): qty 20

## 2023-01-05 MED ORDER — LACTULOSE 10 GM/15ML PO SOLN
30.0000 g | Freq: Two times a day (BID) | ORAL | Status: DC
Start: 2023-01-05 — End: 2023-01-05
  Administered 2023-01-05: 30 g via ORAL

## 2023-01-05 MED ORDER — LACTULOSE 10 GM/15ML PO SOLN
30.0000 g | Freq: Three times a day (TID) | ORAL | Status: DC
Start: 2023-01-05 — End: 2023-01-08
  Administered 2023-01-06 – 2023-01-07 (×5): 30 g via ORAL
  Filled 2023-01-05 (×9): qty 60

## 2023-01-05 MED ORDER — ALBUMIN HUMAN 25 % IV SOLN
50.0000 g | Freq: Three times a day (TID) | INTRAVENOUS | Status: AC
  Administered 2023-01-05: 12.5 g via INTRAVENOUS
  Administered 2023-01-05 (×2): 50 g via INTRAVENOUS
  Administered 2023-01-06: 12.5 g via INTRAVENOUS
  Administered 2023-01-06 (×2): 50 g via INTRAVENOUS
  Filled 2023-01-05 (×5): qty 200

## 2023-01-05 MED ORDER — MIDODRINE HCL 5 MG PO TABS
10.0000 mg | ORAL_TABLET | Freq: Three times a day (TID) | ORAL | Status: DC
  Administered 2023-01-05 – 2023-01-09 (×10): 10 mg via ORAL
  Filled 2023-01-05 (×12): qty 2

## 2023-01-05 MED ORDER — SODIUM ZIRCONIUM CYCLOSILICATE 10 G PO PACK
10.0000 g | PACK | Freq: Three times a day (TID) | ORAL | Status: AC
  Administered 2023-01-05 (×3): 10 g via ORAL
  Filled 2023-01-05 (×3): qty 1

## 2023-01-05 MED ORDER — SODIUM CHLORIDE 0.9 % IV BOLUS
500.0000 mL | Freq: Two times a day (BID) | INTRAVENOUS | Status: DC | PRN
Start: 2023-01-05 — End: 2023-01-09
  Administered 2023-01-05 – 2023-01-07 (×2): 500 mL via INTRAVENOUS

## 2023-01-05 NOTE — Plan of Care (Addendum)
Pt has rested quietly throughout the night with no distress noted. Alert and oriented. On room air. SR on the monitor. Voids per urinal. No complaints voiced.     Problem: Clinical Measurements: Goal: Diagnostic test results will improve Outcome: Progressing   Problem: Activity: Goal: Risk for activity intolerance will decrease Outcome: Progressing   Problem: Pain Management: Goal: General experience of comfort will improve Outcome: Progressing

## 2023-01-05 NOTE — Progress Notes (Signed)
PT Cancellation Note  Patient Details Name: [name] MRN: [id number] DOB: 1980-11-19   Cancelled Treatment:    Reason Eval/Treat Not Completed: PT screened, no needs identified, will sign off (Pt is a hospice pt and per MD no acute PT needs identified. PT will sign off.)   Gladys Damme 01/05/2023, 10:11 AM

## 2023-01-05 NOTE — Progress Notes (Signed)
[name] [phone] [facility] Liaison Note?    [name] is a current hospice patient with [facility] with terminal diagnosis of alcoholic cirrhosis who was admitted to service on 12.22 with pleurex catheter. His catheter was drained that day of 1L, he refused drainage on 12.23, on 12.24 he was seen again and was drained of . At that time he informed nurse he only wanted to be drained weekly. He presented to the ED on 12.25 in the evening and 12.25 with complaints of increased abdominal pain and was admitted to inpatient with diagnosis of end stage liver disease.    Patient was drained of 8L of fluid this morning and has received IV fluids along with Midodrine and Albumin. He reports to feeling some better after drainage and is able to eat some more.   Patient is inpatient appropriate with need for IV treatment of electrolyte imbalance and skilled level monitoring and care.   Vital Signs: 97.4/ 91/18     116/55    spO2 100% on room air I/O:  480/not recorded Abnormal labs: Na+ 116, K+ 5.4, Chloride 92, BUN 31, Ca+ 8, Alk Phos 135, Albumin 2.4, AST 43, Ammonia 53, RBC 2.45, Hgb 8.7, Hct 24.2 Diagnostics:? None new IV/PRN Meds: Thiamine 100mg  IV x1, NS bolus IVx1, Albumin 50g IV q8H, Rocephin 2g IV q24H, Dilaudid 1mg  IV x1, Dilaudid 1mg  PO x1,  Problem list:?  As per MD PN 12.26.24 Dr. Susa Raring End-stage liver disease caused by alcoholic cirrhosis, ongoing alcohol abuse which continues.  Here with massive ascites and abdominal pain, has indwelling abdominal Pleurx catheter, patient is terminal and with home hospice at home, counseled strictly to abstain from alcohol, have consulted IR and GI both.  8 L of fluid removed from Pleurx catheter which has, midodrine and IV fluids along with albumin.  For now empiric IV for SBP for 5 days.  Palliative care also to provide input prognosis is extremely poor.   Hyponatremia, hyperkalemia, AKI.  Hold ACE inhibitor.   Hold diuretics for now, fluid removal via abdominal Pleurx catheter, IV albumin, midodrine and IV fluids as needed while removing fluid.  Lokelma for hyperkalemia, could be developing hepatorenal syndrome, prognosis again is extremely poor.   Thrombocytopenia and anemia of chronic disease.  Due to #1 above.  Supportive care.  Discharge Planning:? Ongoing Family contact: None patient is alert and oriented    IDT:? Updated?   Goals of Care: DNR Please call with any hospice related questions or concerns. Thea Gist, BSN RN Hospice hospital liaison [phone]

## 2023-01-05 NOTE — Discharge Instructions (Signed)
[facility] Espaol: Cuando nos llames al [phone], la esperanza est en juego. Esta iniciativa est en asociacin con las siete LME/MCO del estado y [employer] en [place]. [facility] es confidencial y est disponible las 24 horas del da, los 7 [address]  [address] de la [place]. Neomia Dear persona siempre responder, cuando necesites ayuda.  En tiempos de crisis: Educational psychologist, inc.  Mobile Crisis Management proporciona una respuesta inmediata a las crisis, 24/7.  Llame al [phone]  [facility] para el Centro de Teacher, adult education a Servicios MH/DD/SA est disponible las 24 horas del da, los 7 das de la [place]. Los especialistas en Servicio al Cliente lo ayudarn a Clinical research associate un proveedor de crisis que coincida bien con sus necesidades. Su nmero local es: [phone]  centro de Salud Conductual del [place]/Cuidado Urgente de Salud Conductual (BHUC) PIO, asesoramiento individual, gestin de medicamentos [address] [address], [place], [place] ([place]) [phone] Llame para las horas de admisin; [place] y sin seguro  Proveedores Ambulatorios:  Servicios de Alcohol y [place] (ADS) Asesoramiento grupal e individual. [address]  [address], [place], [place] [phone] [place]: [phone] [place]: [phone] Medicaid y no asegurados.   [facility] Ofrece grupos de PIO varias veces por semana. Direccin: [address] [place], [place] [address] [phone] [place] Medicaid y otros seguros.   [facility] Ambulatorio  Programa Ambulatorio Intensivo de Dependencia [place] (IOP) [address] [phone], [place], [place] [phone] Jeanie Sewer comercial y [place].   [place]  Programa de Hospitalizacin Parcial y [place]  Direccin: [address]  [address], [place], [place] [phone] Seguro privado, [place] solo para hospitalizacin parcial  ACDM Evaluacin y Asesoramiento de [place], [employer]. [address],  [address], [place], [place] [address] [phone] [place]. Opciones a corto y Air cabin crew.Centro de Salud Conductual del [place]/Cuidado Urgente de Salud Conductual (BHUC) PIO, asesoramiento individual, gestin de medicamentos [address] [address], [place], [place] [phone] Medicaid y Sin Seguro  Recursos Conductuales de la [place]ada [address]  [address], [place], [place] [phone] Seguro Privado y Self Pay   [facility] Ambulatorio [address] [place]. [address]  [place], [place] [address] [phone] [place], [place] y Self Pay   Encrucijada: Kerry Dory  [address]  [address], [place], [place] Servicios de [place] de [place]  [address]  [address], [place], [place] [phone]  Melany Guernsey  [address] [place], [place] [address] [phone]    Programas Dionicio Stall Residencial:  [facility] ([facility] [address] [phone] [place], [place] [phone] o [phone] Desintoxicacin y Rehabilitacin Residencial [address] (Medicare, [place], seguro privado y [place]). Sin metadona. Llame para pre-pantalla.   RTS Arlyss Repress de [place] Residencial) [address]  [address], [place], [place] [phone] Detox (autopago y disponibilidad limitada de [place]) Rehab Only Male (Medicare, [place] y Self Pay) - Sin metadona.  [facility] de Fellowship [address] [address], [place], [place] [phone] o [phone] [facility] de la [place]: [phone] FAX: [phone] Programa residencial para mujeres de 21 aos o ms por hasta un ao a travs de un modelo cristiano de recuperacin de 12 pasos. Autopago.    Cam[place] de la [place] [address] [address], [place], [place] Telfono: [phone] Debe ser desintoxicado 72 horas antes de la admisin; [place] de [address]  Autopago.  Centro de [employer]  [address] [place] [phone] Seguro comercial, Medicare, Medicaid (no [place] directo). Ofrecen asistencia con el transporte.   Misin de [employer] Direccin: [address] [place],  [address], [place], [place] [phone] Programa Basado en Cristianos. Solo hombres. Sin seguro  Programa residencial [facility] [address] de [place] [address], [place], [place] Mujeres: [phone] Mens: [phone] No Medicaid.   Centros de Adiccin de [place] [employer] [place] (principalmente [place]) dispuestas a ayudar con el transporte.  [phone] Milus Glazier de seguros comerciales.Instalacin de [place] Residencial [facility]  [address]  [place], [place] [address] [phone] Solo tratamiento, debe hacer una cita de evaluacin y debe estar sobrio para la cita de evaluacin. Autopago, Medicare A y B, Medicaid del [address] de [place], debe ser residente del [place]. Sin metadona.   [facility]  [address] [address], [place], [place] [phone] Sin cargos legales pendientes, Programa de [place] a Air cabin crew. Sin metadona. Convocatoria de evaluacin.  Centro de recuperacin de [place]  [address] [address], [place], [place] [phone] o [phone] Solo Seguro Comercial  Centros de [place] de Ambrosia Local - [phone] Seguro Ples [place] (sin [place]). Hombres/Mujeres, llamar para hacer referencias, mltiples instalaciones   [facility] Direccin: [address],  [address], [place], [place]  [phone] Solo para Hombres Tarifa por adelantado [facility] Programa para mujeres: [facility] [address] [place], [place] [address] [phone]  SWIMs Healing Transitions, sin [facility] [address] [address], [place], [place] [phone] (f)

## 2023-01-05 NOTE — Consult Note (Signed)
Consultation  Referring Provider: TRH/Singh Primary Care Physician:  [name], MD Primary Gastroenterologist:  Dr.Armbruster  Reason for Consultation: Decompensated cirrhosis, massive ascites  HPI: Mathew [name] is a 42 y.o. male, known to Dr. Adela Lank who was recently seen in our office on 12/06/2022.  He has history of severely decompensated cirrhosis and ongoing EtOH abuse and has had multiple prior hospitalizations over this past year.  Previously documented esophageal varices, history of hepatic encephalopathy, history of E. coli bacteremia in September 2024 with SBP.  His disease has worsened over the past months, he has developed refractory ascites, and hyponatremia since initiation of diuretics.  At that time he was also requiring midodrine. He was arranged for repeat paracentesis at that time He required readmission on 12/26/2022 and was discharged home on 12/31/2022 During that admission he was seen by palliative care, made DNR and plan was to be discharged home with home hospice services.  He also had a Pleurx catheter placed by IR on 12/30/2022. Did not have any evidence of SBP at that time.  He returns to the ER last evening with generalized weakness, leg cramps, swelling in his lower extremities and progressive abdominal distention.  It is not clear to me that he has had anything drained from the Pleurx since he left the hospital.  Labs-urine sodium less than 10 Ammonia 53 BBC 4.2/hemoglobin 8.7/platelets 64 Pro time 18.8/INR 1.5 Sodium 116/potassium 5.4/BUN 31/creatinine 0.83 T. bili 3.4/alk phos 135/AST 43/ALT 24  He was discharged with Lasix 40 mg p.o. daily and Aldactone 100 mg p.o. daily, in addition to lactulose, Duragesic patch, and as needed Dilaudid   Past Medical History:  Diagnosis Date   Acute metabolic encephalopathy 06/20/2021   Alcohol withdrawal seizure (HCC)    Cirrhosis (HCC)    ETOH abuse    Pneumonia 04/02/2020   SBP  (spontaneous bacterial peritonitis) (HCC) 06/17/2021    Past Surgical History:  Procedure Laterality Date   COLONOSCOPY WITH PROPOFOL N/A 03/17/2018   Procedure: COLONOSCOPY WITH PROPOFOL;  Surgeon: Bernette Redbird, MD;  Location[facility] ENDOSCOPY;  Service: Endoscopy;  Laterality: N/A;   ESOPHAGOGASTRODUODENOSCOPY (EGD) WITH PROPOFOL N/A 03/17/2018   Procedure: ESOPHAGOGASTRODUODENOSCOPY (EGD) WITH PROPOFOL;  Surgeon: Bernette Redbird, MD;  Location: [facility] ENDOSCOPY;  Service: Endoscopy;  Laterality: N/A;   IR IMAGE GUIDED DRAINAGE PERCUT CATH  PERITONEAL RETROPERIT  12/30/2022   IR PARACENTESIS  03/14/2018   IR PARACENTESIS  11/18/2020   IR PARACENTESIS  06/17/2021   IR PARACENTESIS  08/30/2021   IR PARACENTESIS  09/02/2021   IR PARACENTESIS  11/16/2021   IR PARACENTESIS  03/01/2022   IR PARACENTESIS  05/31/2022   IR PARACENTESIS  06/02/2022   IR PARACENTESIS  06/23/2022   IR PARACENTESIS  07/01/2022   IR PARACENTESIS  08/18/2022   IR PARACENTESIS  09/26/2022   IR PARACENTESIS  11/07/2022   IR PARACENTESIS  12/07/2022   IR PARACENTESIS  12/21/2022   IR PARACENTESIS  12/27/2022    Prior to Admission medications   Medication Sig Start Date End Date Taking? Authorizing Provider  fentaNYL (DURAGESIC) 12 MCG/HR Place 1 patch onto the skin every 3 (three) days. 01/01/23  Yes Rhetta Mura, MD  furosemide (LASIX) 40 MG tablet Take 1 tablet (40 mg total) by mouth daily. 01/01/23  Yes Rhetta Mura, MD  HYDROmorphone (DILAUDID) 2 MG tablet Take 2 mg by mouth every 3 (three) hours as needed. 01/02/23  Yes [provider]  HYDROmorphone HCl (DILAUDID) 1 MG/ML LIQD Take 1  mL (1 mg total) by mouth every 3 (three) hours as needed for severe pain (pain score 7-10). 12/31/22  Yes Rhetta Mura, MD  HYDROmorphone HCl (DILAUDID) 1 MG/ML LIQD Take 1 mL (1 mg total) by mouth every 3 (three) hours as needed. 12/31/22  Yes Rhetta Mura, MD  lactulose (CHRONULAC) 10 GM/15ML solution Take 45  mLs (30 g total) by mouth daily. 01/01/23  Yes Rhetta Mura, MD  spironolactone (ALDACTONE) 100 MG tablet Take 1 tablet (100 mg total) by mouth daily. 01/01/23  Yes Rhetta Mura, MD  traZODone (DESYREL) 50 MG tablet Take 50 mg by mouth daily as needed. 01/02/23  Yes [provider]    Current Facility-Administered Medications  Medication Dose Route Frequency Provider Last Rate Last Admin   0.9 %  sodium chloride infusion  250 mL Intravenous PRN Sundil, Subrina, MD       albumin human 25 % solution 50 g  50 g Intravenous Q8H Leroy Sea, MD 60 mL/hr at 01/05/23 0808 50 g at 01/05/23 6269   docusate sodium (COLACE) capsule 100 mg  100 mg Oral BID Tereasa Coop, MD       fentaNYL (DURAGESIC) 12 MCG/HR 1 patch  1 patch Transdermal Q72H Sundil, Subrina, MD   1 patch at 01/05/23 4854   folic acid (FOLVITE) tablet 1 mg  1 mg Oral Daily Sundil, Subrina, MD       heparin injection 5,000 Units  5,000 Units Subcutaneous Q12H Janalyn Shy, Subrina, MD       HYDROmorphone (DILAUDID) injection 1 mg  1 mg Intravenous Q4H PRN Leroy Sea, MD       HYDROmorphone (DILAUDID) tablet 1 mg  1 mg Oral Q3H PRN Janalyn Shy, Subrina, MD   1 mg at 01/05/23 0003   ibuprofen (ADVIL) tablet 400 mg  400 mg Oral Q6H PRN Janalyn Shy, Subrina, MD       lactulose (CHRONULAC) 10 GM/15ML solution 30 g  30 g Oral Daily Sundil, Subrina, MD       midodrine (PROAMATINE) tablet 10 mg  10 mg Oral TID WC Leroy Sea, MD   10 mg at 01/05/23 0754   multivitamin with minerals tablet 1 tablet  1 tablet Oral Daily Sundil, Subrina, MD       sodium chloride 0.9 % bolus 500 mL  500 mL Intravenous BID PRN Leroy Sea, MD       sodium chloride flush (NS) 0.9 % injection 3 mL  3 mL Intravenous Q12H Sundil, Subrina, MD       sodium chloride flush (NS) 0.9 % injection 3 mL  3 mL Intravenous PRN Janalyn Shy, Subrina, MD       thiamine (VITAMIN B1) tablet 100 mg  100 mg Oral Daily Sundil, Subrina, MD       Or   thiamine  (VITAMIN B1) injection 100 mg  100 mg Intravenous Daily Sundil, Subrina, MD        Allergies as of 01/04/2023   (No Known Allergies)    Family History  Adopted: Yes  Problem Relation Age of Onset   Liver disease Neg Hx    Esophageal cancer Neg Hx    Colon cancer Neg Hx     Social History   Socioeconomic History   Marital status: Single    Spouse name: Not on file   Number of children: 1   Years of education: Not on file   Highest education level: Not on file  Occupational History   Occupation: unemployed  Tobacco Use   Smoking status: Every Day    Current packs/day: 0.25    Average packs/day: 0.3 packs/day for 3.4 years (0.9 ttl pk-yrs)    Types: Cigarettes    Start date: 08/11/2019   Smokeless tobacco: Never  Vaping Use   Vaping status: Never Used  Substance and Sexual Activity   Alcohol use: Yes    Alcohol/week: 2.0 standard drinks of alcohol    Types: 2 Cans of beer per week    Comment: 3-6 beer cans a week   Drug use: No   Sexual activity: Not Currently  Other Topics Concern   Not on file  Social History Narrative   Not on file   Social Drivers of Health   Financial Resource Strain: Medium Risk (10/26/2022)   Overall Financial Resource Strain (CARDIA)    Difficulty of Paying Living Expenses: Somewhat hard  Food Insecurity: Food Insecurity Present (01/05/2023)   Hunger Vital Sign    Worried About Running Out of Food in the Last Year: Sometimes true    Ran Out of Food in the Last Year: Sometimes true  Transportation Needs: No Transportation Needs (01/05/2023)   PRAPARE - Administrator, Civil Service (Medical): No    Lack of Transportation (Non-Medical): No  Recent Concern: Transportation Needs - Unmet Transportation Needs (10/26/2022)   PRAPARE - Transportation    Lack of Transportation (Medical): Yes    Lack of Transportation (Non-Medical): Yes  Physical Activity: Inactive (10/26/2022)   Exercise Vital Sign    Days of Exercise per Week: 0  days    Minutes of Exercise per Session: 0 min  Stress: No Stress Concern Present (10/26/2022)   [employer] of Occupational Health - Occupational Stress Questionnaire    Feeling of Stress : Not at all  Social Connections: Unknown (10/26/2022)   Social Connection and Isolation Panel [NHANES]    Frequency of Communication with Friends and Family: Once a week    Frequency of Social Gatherings with Friends and Family: Never    Attends Religious Services: Never    Database administrator or Organizations: Yes    Attends Banker Meetings: Never    Marital Status: Patient declined  Catering manager Violence: Not At Risk (01/05/2023)   Humiliation, Afraid, Rape, and Kick questionnaire    Fear of Current or Ex-Partner: No    Emotionally Abused: No    Physically Abused: No    Sexually Abused: No    Review of Systems: Pertinent positive and negative review of systems were noted in the above HPI section.  All other review of systems was otherwise negative.   Physical Exam: Vital signs in last 24 hours: Temp:  [97.4 F (36.3 C)-97.8 F (36.6 C)] 97.4 F (36.3 C) (12/26 0837) Pulse Rate:  [86-96] 86 (12/26 0837) Resp:  [14-20] 18 (12/26 0837) BP: (111-126)/(57-84) 113/72 (12/26 0837) SpO2:  [100 %] 100 % (12/26 0837) Weight:  [79.4 kg-79.9 kg] 79.9 kg (12/26 0529)   General:   Alert,  Well-developed, chronically ill-appearing somewhat cachectic appearing Hispanic male pleasant and cooperative in NAD Head:  Normocephalic and atraumatic. Eyes:  Scleraicteric.   Conjunctiva pink. Ears:  Normal auditory acuity. Nose:  No deformity, discharge,  or lesions. Mouth:  No deformity or lesions.   Neck:  Supple; no masses or thyromegaly. Lungs:  Clear throughout to auscultation.   No wheezes, crackles, or rhonchi.  Heart:  Regular rate and rhythm; no murmurs, clicks, rubs,  or gallops. Abdomen: Massively  distended tense ascites, Pleurx catheter present, nurse at bedside  currently, unable to get any fluid to drain, he has rather generalized tenderness, no rebound large protuberant umbilical hernia Rectal:  Deferred  Msk:  Symmetrical without gross deformities. . Pulses:  Normal pulses noted. Extremities: 1-2+ edema bilateral lower extremities above the knees Neurologic:  Alert and  oriented x3; patient is slow but appropriate, positive early asterixis Skin:  Intact without significant lesions or rashes.. Psych:  Alert and cooperative.  Intake/Output from previous day: 12/25 0701 - 12/26 0700 In: 480 [P.O.:480] Out: -  Intake/Output this shift: No intake/output data recorded.  Lab Results: Recent Labs    01/04/23 2217 01/05/23 0511  WBC 4.4 4.2  HGB 8.3* 8.7*  HCT 23.9* 24.2*  PLT 67* 64*   BMET Recent Labs    01/04/23 2217 01/05/23 0511  NA 116* 116*  K 5.0 5.4*  CL 92* 92*  CO2 17* 18*  GLUCOSE 133* 117*  BUN 32* 31*  CREATININE 0.82 0.83  CALCIUM 8.1* 8.0*   LFT Recent Labs    01/05/23 0511  PROT 5.3*  ALBUMIN 2.4*  AST 43*  ALT 24  ALKPHOS 135*  BILITOT 3.4*   PT/INR Recent Labs    01/05/23 0511  LABPROT 18.8*  INR 1.5*   Hepatitis Panel No results for input(s): "HEPBSAG", "HCVAB", "HEPAIGM", "HEPBIGM" in the last 72 hours.    IMPRESSION:  #40 42 year old Hispanic male, with history of EtOH induced decompensated cirrhosis, continued EtOH use, progressive disease with refractory ascites. He was just discharged from the hospital 4 days ago and at that hospitalization was seen by palliative care, made DNR and arrangements were made for home hospice. He had a Pleurx catheter placed for as needed drainage of ascites. He returns to the hospital last evening with complaints of generalized weakness, and increased abdominal distention  He had been discharged home on diuretics unclear whether he had been compliant with those or not.  I do not think he has had any fluid drained from the Pleurx since discharge from the  hospital and the catheter is currently not functioning appropriately (unable to drain any fluid)  #2 severe hyponatremia #3 hyperkalemia # 4  Hepatic encephalopathy #5  anemia chronic  PLAN: Will contact IR this morning for reevaluation of the Pleurx catheter, and once functioning okay to remove 8 L IV albumin 50 g post paracentesis Would aim towards removal of 6 to 7 L max every couple of days Holding diuretics  2 Gram sodium diet With quick return to the hospital, uncertain that hospice care at home is going to [facility] get palliative care reinvolved, question if [facility] would be appropriate   Abisai Coble EsterwoodPA-C  01/05/2023, 8:50 AM

## 2023-01-05 NOTE — Progress Notes (Signed)
OT Screen Note  Patient Details Name: Mathew [name] MRN: [id number] DOB: 1980-10-04   Cancelled Treatment:    Reason Eval/Treat Not Completed: OT screened, no needs identified, will sign off (PT relayed messaged from MD that pt is receiving home hospice and not at the medical point where he has rehab potential. Reconsult OT if needed.)  01/05/2023  AB, OTR/L  Acute Rehabilitation Services  Office: [phone]   Tristan Schroeder 01/05/2023, 10:37 AM

## 2023-01-05 NOTE — Progress Notes (Signed)
PROGRESS NOTE                                                                                                                                                                                                             Patient Demographics:    Mathew [name], is a 42 y.o. male, DOB - November 11, 1980, ZOX:[id number]  Outpatient Primary MD for the patient is [name], MD    LOS - 0  Admit date - 01/04/2023    Chief Complaint  Patient presents with   Abdominal Pain   Leg Pain    right       Brief Narrative (HPI from H&P)      43 y.o. 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    Last drink yesterday 12/24. Patient reported generalized abdominal pain which is chronic in nature.  However he is endorsing generalized weakness.  Patient is currently on hospice care stated that on daily basis he has almost 1 L of output through the abdominal drain. Currently patient is on hospice care for end-stage liver disease and hospice nurse coming to patient so managing and draining the abdominal Pleurx catheter.  Patient is also DNR and DNI based on palliative discussion during last hospital admission.   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  Here with massive ascites and abdominal pain, has indwelling abdominal Pleurx catheter, patient is terminal and with home hospice at home, counseled strictly to abstain from alcohol, have consulted IR and GI both.  8 L of fluid removed from Pleurx catheter which has, midodrine and IV fluids along with albumin.  For now empiric IV for SBP for 5 days.  Palliative care also to provide input prognosis is extremely poor.  Hyponatremia, hyperkalemia, AKI.  Hold ACE inhibitor.  Hold diuretics for now, fluid removal via abdominal Pleurx catheter, IV albumin, midodrine and IV fluids as needed while removing fluid.  Lokelma for hyperkalemia, could be developing hepatorenal syndrome, prognosis again is extremely poor.  Thrombocytopenia and anemia of chronic disease.  Due to #1 above.  Supportive care.      Condition - Extremely Guarded  Family Communication  :  None  Code Status :  DNR  Consults  :  IR, GI, Pall care  PUD Prophylaxis :     Procedures  :     Ascites fluid removal via Pleurx catheter on 01/05/2023.  8 L removed.      Disposition Plan  :    Status is: Observation   DVT Prophylaxis  :    heparin injection 5,000 Units Start: 01/05/23 1000 SCDs Start: 01/04/23 2324 Place TED hose Start: 01/04/23 2324   Lab Results  Component Value Date   PLT 64 (L) 01/05/2023    Diet :  Diet Order             Diet Heart Room service appropriate? Yes; Fluid consistency: Thin; Fluid restriction: 1200 mL Fluid  Diet effective now                    Inpatient Medications  Scheduled Meds:  docusate sodium  100 mg Oral BID   fentaNYL  1 patch Transdermal Q72H   folic acid  1 mg Oral Daily   heparin injection (subcutaneous)  5,000 Units Subcutaneous Q12H   lactulose  30 g Oral BID   midodrine  10 mg Oral TID WC   multivitamin with minerals  1 tablet Oral Daily   sodium chloride flush  3 mL Intravenous Q12H   thiamine  100 mg Oral Daily    Or   thiamine  100 mg Intravenous Daily   Continuous Infusions:  sodium chloride     albumin human 50 g (01/05/23 0808)   sodium chloride     PRN Meds:.sodium chloride, HYDROmorphone (DILAUDID) injection, HYDROmorphone, ibuprofen, sodium chloride, sodium chloride flush  Antibiotics  :    Anti-infectives (From admission, onward)    None         Objective:   Vitals:   01/05/23 0102 01/05/23 0400 01/05/23 0529 01/05/23 0837  BP: 126/75   113/72  Pulse: 86   86  Resp: 16 16  18   Temp: (!) 97.4 F (36.3 C) 97.8 F (36.6 C)  (!) 97.4 F (36.3 C)  TempSrc: Oral Oral  Oral  SpO2: 100%   100%  Weight: 79.9 kg  79.9 kg   Height: 5\' 5"  (1.651 m)       Wt Readings from Last 3 Encounters:  01/05/23 79.9 kg  12/06/22 85 kg  12/05/22 84.6 kg     Intake/Output Summary (Last 24 hours) at 01/05/2023 1032 Last data filed at 01/05/2023 0500 Gross per 24 hour  Intake 480 ml  Output --  Net 480 ml     Physical Exam  Awake Alert, No new F.N deficits, Normal affect [place].AT,PERRAL Supple Neck, No JVD,   Symmetrical Chest wall movement, Good air movement bilaterally, CTAB RRR,No Gallops,Rubs or new Murmurs,  +ve B.Sounds, Abd is massively distended with severe ascites, Pleurx catheter in place No Cyanosis, Clubbing or edema       Data Review:    Recent Labs  Lab 12/30/22 0548 12/31/22 0232 01/04/23 2217 01/05/23 0511  WBC 4.5 4.3  4.4 4.2  HGB 8.1* 8.0* 8.3* 8.7*  HCT 22.5* 22.7* 23.9* 24.2*  PLT 81* 70* 67* 64*  MCV 98.7 99.6 99.6 98.8  MCH 35.5* 35.1* 34.6* 35.5*  MCHC 36.0 35.2 34.7 36.0  RDW 15.9* 15.6* 14.2 14.3  LYMPHSABS  --   --  0.5*  --   MONOABS  --   --  0.6  --   EOSABS  --   --  0.1  --   BASOSABS  --   --  0.0  --     Recent Labs  Lab 12/30/22 0548 12/31/22 0232 01/04/23 2217 01/05/23 0511  NA 120* 117* 116* 116*  K 4.1 4.2 5.0 5.4*  CL 98 96* 92* 92*  CO2 16* 18* 17* 18*  ANIONGAP 6 3* 7 6  GLUCOSE 100* 120* 133* 117*  BUN 22*  28* 32* 31*  CREATININE 1.66* 1.22 0.82 0.83  AST 25 25 43* 43*  ALT 15 14 23 24   ALKPHOS 88 99 145* 135*  BILITOT 4.2* 2.6* 3.0* 3.4*  ALBUMIN 2.3* 2.1* 2.5* 2.4*  INR  --   --   --  1.5*  AMMONIA  --   --   --  53*  CALCIUM 7.6* 7.4* 8.1* 8.0*     Recent Labs  Lab 12/30/22 0548 12/31/22 0232 01/04/23 2217 01/05/23 0511  INR  --   --   --  1.5*  AMMONIA  --   --   --  53*  CALCIUM 7.6* 7.4* 8.1* 8.0*    --------------------------------------------------------------------------------------------------------------- No results found for: "CHOL", "HDL", "LDLCALC", "LDLDIRECT", "TRIG", "CHOLHDL"  Lab Results  Component Value Date   HGBA1C 4.7 (L) 10/14/2019    Micro Results Recent Results (from the past 240 hours)  Body fluid culture w Gram Stain     Status: None   Collection Time: 12/27/22  3:22 PM   Specimen: Abdomen; Peritoneal Fluid  Result Value Ref Range Status   Specimen Description PERITONEAL  Final   Special Requests NONE  Final   Gram Stain NO WBC SEEN NO ORGANISMS SEEN   Final   Culture   Final    NO GROWTH 3 DAYS Performed at [facility], [address], [place], [place] [address]    Report Status 12/30/2022 FINAL  Final    Radiology Reports No results found.    Signature  -   Susa Raring M.D on 01/05/2023 at 10:32 AM   -  To page go to [email or web address]

## 2023-01-05 NOTE — Progress Notes (Signed)
Patient with history of end-stage cirrhosis, refractory large volume ascites s/p tunneled peritoneal catheter placement 12/30/22 in IR.   Per primary team catheter not draining today and requesting assessment/possible paracentesis with 8 L limit.  Patient brought to IR suite, catheter accessed with Pleurx catheter kit with immediate return of serosanguineous fluid. Total of 8.0 liters removed. Pleurx teaching done with bedside RN today.  If patient requires further Pleurx drainage during this admission please call central supply for a Pleurx access kit (not placement kit) to be sent to the floor. Step by step instructions for connecting the catheter to drainage canister are within the sterile packaging. Drainage can be performed by bedside RN or hospice RN.   IR remains available as needed, please call with questions or concerns.  Lynnette Caffey, PA-C

## 2023-01-05 NOTE — TOC CM/SW Note (Signed)
Transition of Care [facility]) - Inpatient Brief Assessment   Patient Details  Name: Mathew [name] MRN: [id number] Date of Birth: Jan 04, 1981  Transition of Care [facility]) CM/SW Contact:    Mearl Latin, LCSW Phone Number: 01/05/2023, 2:24 PM   Clinical Narrative: Patient admitted from home with [facility] and was recently discharged on 12/21. ETOH resources placed in Spanish on AVS for follow up though patient is aware ETOH is negatively affecting his health.    Transition of Care Asessment: Insurance and Status: Insurance coverage has been reviewed Patient has primary care physician: No (Has a Pulmonologist.) Home environment has been reviewed: From home Prior level of function:: Independent Prior/Current Home Services: Current home services (Hospice at home) Social Drivers of Health Review: SDOH reviewed interventions complete Readmission risk has been reviewed: Yes Transition of care needs: transition of care needs identified, TOC will continue to follow

## 2023-01-06 ENCOUNTER — Other Ambulatory Visit: Payer: Self-pay

## 2023-01-06 LAB — COMPREHENSIVE METABOLIC PANEL
ALT: 14 U/L (ref 0–44)
AST: 27 U/L (ref 15–41)
Albumin: 3.9 g/dL (ref 3.5–5.0)
Alkaline Phosphatase: 72 U/L (ref 38–126)
Anion gap: 5 (ref 5–15)
BUN: 29 mg/dL — ABNORMAL HIGH (ref 6–20)
CO2: 20 mmol/L — ABNORMAL LOW (ref 22–32)
Calcium: 8.5 mg/dL — ABNORMAL LOW (ref 8.9–10.3)
Chloride: 93 mmol/L — ABNORMAL LOW (ref 98–111)
Creatinine, Ser: 0.84 mg/dL (ref 0.61–1.24)
GFR, Estimated: >60 mL/min (ref >60)
Glucose, Bld: 117 mg/dL — ABNORMAL HIGH (ref 70–99)
Potassium: 4.6 mmol/L (ref 3.5–5.1)
Sodium: 118 mmol/L — CL (ref 135–145)
Total Bilirubin: 3 mg/dL — ABNORMAL HIGH (ref <1.2)
Total Protein: 5.5 g/dL — ABNORMAL LOW (ref 6.5–8.1)

## 2023-01-06 LAB — AMMONIA: Ammonia: 87 umol/L — ABNORMAL HIGH (ref 9–35)

## 2023-01-06 LAB — BRAIN NATRIURETIC PEPTIDE: B Natriuretic Peptide: 677.8 pg/mL — ABNORMAL HIGH (ref 0.0–100.0)

## 2023-01-06 LAB — PHOSPHORUS: Phosphorus: 3.8 mg/dL (ref 2.5–4.6)

## 2023-01-06 LAB — MAGNESIUM: Magnesium: 2.1 mg/dL (ref 1.7–2.4)

## 2023-01-06 LAB — TYPE AND SCREEN
ABO/RH(D): O POS
Antibody Screen: NEGATIVE

## 2023-01-06 LAB — GLUCOSE, CAPILLARY: Glucose-Capillary: 134 mg/dL — ABNORMAL HIGH (ref 70–99)

## 2023-01-06 MED ORDER — SODIUM CHLORIDE 0.9 % IV BOLUS: 500.0000 mL | Freq: Every day | INTRAVENOUS | Status: DC | PRN

## 2023-01-06 MED ORDER — ONDANSETRON HCL 4 MG/2ML IJ SOLN
4.0000 mg | Freq: Four times a day (QID) | INTRAMUSCULAR | Status: DC | PRN
  Administered 2023-01-06 – 2023-01-08 (×4): 4 mg via INTRAVENOUS
  Filled 2023-01-06 (×4): qty 2

## 2023-01-06 NOTE — Plan of Care (Signed)

## 2023-01-06 NOTE — Progress Notes (Signed)
Patient ID: Mathew [name], male   DOB: 1980-08-17, 42 y.o.   MRN: [id number]    Progress Note   Subjective   Day # 2 CC; severely decompensated cirrhosis, massive ascites  8 L paracentesis yesterday  Covering empirically for SBP x 5 days  Labs today-ammonia 87 BNP 677 Sodium 118/potassium 4.6/BUN 29/creatinine 0.84 T. bili 3.0/LFTs otherwise normal  Patient alert, says he has pain in his abdomen no change from yesterday, seems a bit confused today. Per hospice notes yesterday patient had been having fluid removed about 1 L/day after he was discharged from the hospital but apparently refused it on 12/23, and had indicated he only wanted fluid removed on a weekly basis   Objective   Vital signs in last 24 hours: Temp:  [98 F (36.7 C)-98.2 F (36.8 C)] 98.2 F (36.8 C) (12/27 1240) Pulse Rate:  [85-109] 99 (12/27 1256) Resp:  [18] 18 (12/26 2000) BP: (102-124)/(56-70) 117/65 (12/27 1256) SpO2:  [98 %-100 %] 98 % (12/27 1256) Weight:  [81.5 kg] 81.5 kg (12/27 0500)   General:    Ill-appearing Hispanic male in no acute distress able to speak some English Heart:  Regular rate and rhythm; no murmurs Lungs: Respirations even and unlabored, lungs CTA bilaterally Abdomen: Still with fairly tight ascites not as tense as yesterday, mild generalized pain. Normal bowel sounds. Extremities: 1-2 + edema. Neurologic:  Alert , oriented x 2  + asterixis Psych:  Cooperative. Normal mood and affect.  Intake/Output from previous day: No intake/output data recorded. Intake/Output this shift: No intake/output data recorded.  Lab Results: Recent Labs    01/04/23 2217 01/05/23 0511  WBC 4.4 4.2  HGB 8.3* 8.7*  HCT 23.9* 24.2*  PLT 67* 64*   BMET Recent Labs    01/04/23 2217 01/05/23 0511 01/06/23 0835  NA 116* 116* 118*  K 5.0 5.4* 4.6  CL 92* 92* 93*  CO2 17* 18* 20*  GLUCOSE 133* 117* 117*  BUN 32* 31* 29*  CREATININE 0.82 0.83 0.84  CALCIUM 8.1* 8.0* 8.5*    LFT Recent Labs    01/06/23 0835  PROT 5.5*  ALBUMIN 3.9  AST 27  ALT 14  ALKPHOS 72  BILITOT 3.0*   PT/INR Recent Labs    01/05/23 0511  LABPROT 18.8*  INR 1.5*    Studies/Results: VAS [place] LOWER EXTREMITY VENOUS (DVT) Result Date: 01/05/2023  Lower Venous DVT Study Patient Name:  AMIRJON BOWLING [name]  Date of Exam:   01/05/2023 Medical Rec #: [id number]                  Accession #:    [id number] Date of Birth: [birth year]                  Patient Gender: M Patient Age:   39 years Exam Location:  [facility] Procedure:      VAS [place] LOWER EXTREMITY VENOUS (DVT) Referring Phys: Eulah Pont SUNDIL --------------------------------------------------------------------------------  Indications: Swelling, and Edema.  Comparison Study: No prior exam. Performing Technologist: Fernande Bras  Examination Guidelines: A complete evaluation includes B-mode imaging, spectral Doppler, color Doppler, and power Doppler as needed of all accessible portions of each vessel. Bilateral testing is considered an integral part of a complete examination. Limited examinations for reoccurring indications may be performed as noted. The reflux portion of the exam is performed with the patient in reverse Trendelenburg.  +---------+---------------+---------+-----------+----------+--------------+ RIGHT    CompressibilityPhasicitySpontaneityPropertiesThrombus Aging +---------+---------------+---------+-----------+----------+--------------+ CFV      Full  Yes      Yes                                 +---------+---------------+---------+-----------+----------+--------------+ SFJ      Full           Yes      Yes                                 +---------+---------------+---------+-----------+----------+--------------+ FV Prox  Full                                                        +---------+---------------+---------+-----------+----------+--------------+ FV Mid   Full                                                         +---------+---------------+---------+-----------+----------+--------------+ FV DistalFull                                                        +---------+---------------+---------+-----------+----------+--------------+ PFV      Full                                                        +---------+---------------+---------+-----------+----------+--------------+ POP      Full           Yes      Yes                                 +---------+---------------+---------+-----------+----------+--------------+ PTV      Full                                                        +---------+---------------+---------+-----------+----------+--------------+ PERO     Full                                                        +---------+---------------+---------+-----------+----------+--------------+   +---------+---------------+---------+-----------+----------+--------------+ LEFT     CompressibilityPhasicitySpontaneityPropertiesThrombus Aging +---------+---------------+---------+-----------+----------+--------------+ CFV      Full           Yes      Yes                                 +---------+---------------+---------+-----------+----------+--------------+ SFJ      Full  Yes      Yes                                 +---------+---------------+---------+-----------+----------+--------------+ FV Prox  Full                                                        +---------+---------------+---------+-----------+----------+--------------+ FV Mid   Full                                                        +---------+---------------+---------+-----------+----------+--------------+ FV DistalFull                                                        +---------+---------------+---------+-----------+----------+--------------+ PFV      Full                                                         +---------+---------------+---------+-----------+----------+--------------+ POP      Full           Yes      Yes                                 +---------+---------------+---------+-----------+----------+--------------+ PTV      Full                                                        +---------+---------------+---------+-----------+----------+--------------+ PERO     Full                                                        +---------+---------------+---------+-----------+----------+--------------+     Summary: BILATERAL: - No evidence of deep vein thrombosis seen in the lower extremities, bilaterally. -No evidence of popliteal cyst, bilaterally.   *See table(s) above for measurements and observations. Electronically signed by Coral Else MD on 01/05/2023 at 8:29:35 PM.    Final    IR Radiologist Eval & Mgmt Result Date: 01/05/2023 INDICATION: Patient with history of end-stage cirrhosis, refractory ascites s/p tunneled peritoneal catheter placement 12/30/22 in IR. Patient admitted with abdominal pain and concern for malfunctioning Pleurx. EXAM: Pleurx catheter assessment TECHNIQUE: None CONTRAST:  None MEDICATIONS: None ANESTHESIA/SEDATION: None RADIOPHARMACEUTICALS:  None FLUOROSCOPY TIME:  None COMPARISON:  None FINDINGS: Pleurx insertion site clean, dry without erythema or edema. Retention suture is in tact. Pleurx accessed using sterile technique with immediate return of serosanguineous  fluid. The catheter was attached to a Vacutainer and 8.0 L was removed. Sterile cap and dressing applied. PROCEDURE: See above. COMPLICATIONS: None immediate. IMPRESSION: Pleurx catheter accessed without issue. 8.0 L of serosanguineous fluid was removed which was the requested limit by the GI team. Performed by Lynnette Caffey, PA-C Electronically Signed   By: Irish Lack M.D.   On: 01/05/2023 14:12       Assessment / Plan:    #68 42 year old male alcoholic with ongoing EtOH  use, with early decompensated cirrhosis and massive ascites, refractory Patient is now on hospice care, and Pleurx catheter was placed about a week ago to help manage his massive ascites.  8 L removed yesterday,, plan is to remove about 8 L/day over the next couple of days while he is in the hospital He is also receiving IV albumin, and is on midodrine Covering empirically for SBP with 5 days of IV antibiotics  #2 severe hyponatremia-diuretics and ACE on hold Receiving IV albumin, and midodrine  #3 anemia chronic-stable  Plan; as above, agree with management as per Dr. Thedore Mins Will see how he does with fluid removal over the next few days, he may need to have more than a liter removed at home daily if patient will allow but generally would not remove more than 7 to 8 L at a time. Prognosis poor Plan is to return to home hospice care    Principal Problem:   Acute on chronic hyponatremia Active Problems:   Alcoholic cirrhosis (HCC)   Alcohol use disorder   End stage liver disease (HCC)   Chronic anemia   Chronic idiopathic thrombocytopenia (HCC)     LOS: 1 day   Chasyn Cinque PA-C 01/06/2023, 1:30 PM

## 2023-01-06 NOTE — Progress Notes (Signed)
[name] [phone] [facility] Liaison Note?     [name] is a current hospice patient with [facility] with terminal diagnosis of alcoholic cirrhosis who was admitted to service on 12.22 with pleurex catheter. His catheter was drained that day of 1L, he refused drainage on 12.23, on 12.24 he was seen again and was drained of . At that time he informed nurse he only wanted to be drained weekly. He presented to the ED on 12.25 in the evening and 12.25 with complaints of increased abdominal pain and was admitted to inpatient with diagnosis of end stage liver disease.     Patient was drained of 8L of fluid this morning and has received IV fluids along with Midodrine and Albumin. Patient was sleeping at time of my visit.   Patient is inpatient appropriate with need for IV treatment of electrolyte imbalance and skilled level monitoring and care.    Vital Signs: 98/109/not documented   108/64    spO2 100% on room air I/O: not recorded Abnormal labs: Na+118, Chloride 93, BUN 29, Ca+ 8.5, CO2 20, ALT 5.5, Total Bilirubin 3.0, Glucose 117 Diagnostics:? None new IV/PRN Meds: NS bolus IVx1, Albumin 50g IV q8H, Rocephin 2g IV q24H, Dilaudid 1mg  PO x1  Problem list:?  As per MD PN 12.27.24 Dr. Susa Raring End-stage liver disease caused by alcoholic cirrhosis, ongoing alcohol abuse which continues.  Here with massive ascites and abdominal pain, has indwelling abdominal Pleurx catheter, patient is terminal and with home hospice at home, counseled strictly to abstain from alcohol, 8 L of peritoneal fluid removed via his Pleurx catheter on 01/06/2023 along with midodrine , IVr fluids and albumin.  For now empiric IV for SBP for 5 days.  Palliative care also to provide input prognosis is extremely poor.  Will try and remove another 8 units on a daily basis for the next 2 to 3 days as he clearly has about 20 to 30 L of peritoneal fluid.   Hyponatremia, hyperkalemia, AKI.  Hold  ACE inhibitor.  Hold diuretics for now, fluid removal via abdominal Pleurx catheter, IV albumin, midodrine and IV fluids as needed while removing fluid.  Lokelma for hyperkalemia, could be developing hepatorenal syndrome, prognosis again is extremely poor.   Thrombocytopenia and anemia of chronic disease.  Due to #1 above.  Supportive care.   Discharge Planning:? Ongoing Family contact: None patient is alert and oriented    IDT:? Updated?   Goals of Care: DNR  Please call with any hospice related questions or concerns. [facility] hospital liaison [phone]

## 2023-01-06 NOTE — Plan of Care (Signed)
  Problem: Education: Goal: Knowledge of General Education information will improve Description: Including pain rating scale, medication(s)/side effects and non-pharmacologic comfort measures Outcome: Progressing   Problem: Health Behavior/Discharge Planning: Goal: Ability to manage health-related needs will improve Outcome: Progressing   Problem: Clinical Measurements: Goal: Ability to maintain clinical measurements within normal limits will improve Outcome: Progressing Goal: Will remain free from infection Outcome: Progressing Goal: Diagnostic test results will improve Outcome: Progressing Goal: Respiratory complications will improve Outcome: Progressing Goal: Cardiovascular complication will be avoided Outcome: Progressing   Problem: Elimination: Goal: Will not experience complications related to bowel motility Outcome: Progressing Goal: Will not experience complications related to urinary retention Outcome: Progressing   

## 2023-01-06 NOTE — Progress Notes (Signed)
PROGRESS NOTE                                                                                                                                                                                                             Patient Demographics:    Mathew [name], is a 42 y.o. male, DOB - Apr 15, 1980, ZOX:[id number]  Outpatient Primary MD for the patient is [name], MD    LOS - 1  Admit date - 01/04/2023    Chief Complaint  Patient presents with   Abdominal Pain   Leg Pain    right       Brief Narrative (HPI from H&P)      42 y.o. 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    Last drink yesterday 12/24. Patient reported generalized abdominal pain which is chronic in nature.  However he is endorsing generalized weakness.  Patient is currently on hospice care stated that on daily basis he has almost 1 L of output through the abdominal drain. Currently patient is on hospice care for end-stage liver disease and hospice nurse coming to patient so managing and draining the abdominal Pleurx catheter.  Patient is also DNR and DNI based on palliative discussion during last hospital admission.   Subjective:   Patient in bed, appears comfortable, denies any headache, no fever, no chest pain or pressure, no shortness of breath , has mild generalized abdominal pain. No focal weakness.   Assessment  & Plan :   Patient is  home hospice.  All treatment currently is palliative, he is DNR.     End-stage liver disease caused by alcoholic cirrhosis, ongoing alcohol abuse which continues.  Here with massive ascites and abdominal pain, has indwelling abdominal Pleurx catheter, patient is terminal and with home hospice at home, counseled strictly to abstain from alcohol, 8 L of peritoneal fluid removed via his Pleurx catheter on 01/06/2023 along with midodrine , IVr fluids and albumin.  For now empiric IV for  SBP for 5 days.  Palliative care also to provide input prognosis is extremely poor.  Will try and remove another 8 units on a daily basis for the next 2 to 3 days as he clearly has about 20 to 30 L of peritoneal fluid.   Hyponatremia, hyperkalemia, AKI.  Hold ACE inhibitor.  Hold diuretics for now, fluid removal via abdominal Pleurx catheter, IV albumin, midodrine and IV fluids as needed while removing fluid.  Lokelma for hyperkalemia, could be developing hepatorenal syndrome, prognosis again is extremely poor.  Thrombocytopenia and anemia of chronic disease.  Due to #1 above.  Supportive care.      Condition - Extremely Guarded  Family Communication  :  None  Code Status :  DNR  Consults  :  IR, GI, Pall care  PUD Prophylaxis :     Procedures  :     Ascites fluid removal via Pleurx catheter on 01/05/2023.  8 L removed.      Disposition Plan  :    Status is: Observation   DVT Prophylaxis  :    heparin injection 5,000 Units Start: 01/05/23 1000 SCDs Start: 01/04/23 2324 Place TED hose Start: 01/04/23 2324   Lab Results  Component Value Date   PLT 64 (L) 01/05/2023    Diet :  Diet Order             Diet Heart Room service appropriate? Yes; Fluid consistency: Thin; Fluid restriction: 1200 mL Fluid  Diet effective now                    Inpatient Medications  Scheduled Meds:  docusate sodium  100 mg Oral BID   fentaNYL  1 patch Transdermal Q72H   folic acid  1 mg Oral Daily    heparin injection (subcutaneous)  5,000 Units Subcutaneous Q12H   lactulose  30 g Oral TID   midodrine  10 mg Oral TID WC   multivitamin with minerals  1 tablet Oral Daily   rifaximin  550 mg Oral BID   thiamine  100 mg Oral Daily   Or   thiamine  100 mg Intravenous Daily   Continuous Infusions:  albumin human 12.5 g (01/06/23 0514)   cefTRIAXone (ROCEPHIN)  IV 2 g (01/05/23 1243)   sodium chloride 500 mL (01/05/23 1204)   sodium chloride     PRN Meds:.HYDROmorphone (DILAUDID) injection, HYDROmorphone, sodium chloride, sodium chloride  Antibiotics  :    Anti-infectives (From admission, onward)    Start     Dose/Rate Route Frequency Ordered Stop   01/05/23 1130  rifaximin (XIFAXAN) tablet 550 mg        550 mg Oral 2 times daily 01/05/23 1033     01/05/23 1130  cefTRIAXone (ROCEPHIN) 2 g in sodium chloride 0.9 % 100 mL IVPB        2 g 200 mL/hr over 30 Minutes Intravenous Every 24 hours 01/05/23 1034 01/10/23 1129         Objective:   Vitals:   01/06/23 0000 01/06/23 0500 01/06/23 0828 01/06/23 0856  BP: (!) 103/57  108/64   Pulse:   (!) 109   Resp:      Temp:   98 F (36.7 C)   TempSrc:   Oral   SpO2:   100% 100%  Weight:  81.5 kg    Height:        Wt Readings from Last 3 Encounters:  01/06/23 81.5 kg  12/06/22 85 kg  12/05/22 84.6 kg    No intake or output data in the 24 hours ending 01/06/23 1056    Physical Exam  Awake Alert, No new F.N deficits, Normal affect [place].AT,PERRAL Supple Neck, No JVD,   Symmetrical Chest wall movement, Good air movement bilaterally, CTAB RRR,No Gallops,Rubs or new Murmurs,  +ve B.Sounds, Abd is massively distended with severe ascites, Pleurx catheter in place No Cyanosis, Clubbing or edema       Data Review:    Recent Labs  Lab 12/31/22 0232 01/04/23 2217 01/05/23 0511  WBC 4.3 4.4 4.2  HGB 8.0* 8.3* 8.7*  HCT 22.7* 23.9* 24.2*  PLT 70* 67* 64*  MCV 99.6 99.6 98.8  MCH 35.1* 34.6* 35.5*  MCHC 35.2 34.7 36.0   RDW 15.6* 14.2 14.3  LYMPHSABS  --  0.5*  --   MONOABS  --  0.6  --   EOSABS  --  0.1  --   BASOSABS  --  0.0  --     Recent Labs  Lab 12/31/22 0232 01/04/23 2217 01/05/23 0511 01/06/23 0559 01/06/23 0835  NA 117* 116* 116*  --  118*  K 4.2 5.0 5.4*  --  4.6  CL 96* 92* 92*  --  93*  CO2 18* 17* 18*  --  20*  ANIONGAP 3* 7 6  --  5  GLUCOSE 120* 133* 117*  --  117*  BUN 28* 32* 31*  --  29*  CREATININE 1.22 0.82 0.83  --  0.84  AST 25 43* 43*  --  27  ALT 14 23 24   --  14  ALKPHOS 99 145* 135*  --  72  BILITOT 2.6* 3.0* 3.4*  --  3.0*  ALBUMIN 2.1* 2.5* 2.4*  --  3.9  INR  --   --  1.5*  --   --   AMMONIA  --   --  53* 87*  --   BNP  --   --   --  677.8*  --   MG  --   --   --   --  2.1  CALCIUM 7.4* 8.1* 8.0*  --  8.5*     Recent Labs  Lab 12/31/22 0232 01/04/23 2217 01/05/23 0511 01/06/23 0559 01/06/23 0835  INR  --   --  1.5*  --   --   AMMONIA  --   --  53* 87*  --   BNP  --   --   --  677.8*  --   MG  --   --   --   --  2.1  CALCIUM 7.4* 8.1* 8.0*  --  8.5*    --------------------------------------------------------------------------------------------------------------- No results found for: "CHOL", "HDL", "LDLCALC", "LDLDIRECT", "TRIG", "CHOLHDL"  Lab Results  Component Value Date   HGBA1C 4.7 (L) 10/14/2019    Micro Results Recent Results (from the past 240 hours)  Body fluid culture w Gram Stain     Status: None   Collection Time: 12/27/22  3:22 PM   Specimen: Abdomen; Peritoneal Fluid  Result Value Ref Range Status   Specimen Description PERITONEAL  Final   Special Requests NONE  Final   Gram Stain NO WBC SEEN NO ORGANISMS SEEN   Final   Culture   Final    NO GROWTH 3 DAYS Performed at [facility], [address] [place], [place] [address]    Report Status 12/30/2022 FINAL  Final    Radiology Reports VAS [place] LOWER EXTREMITY VENOUS (  DVT) Result Date: 01/05/2023  Lower Venous DVT Study Patient Name:  GRAY AFFLECK  [facility]  Date of Exam:   01/05/2023 Medical Rec #: [id number]                  Accession #:    [id number] Date of Birth: 11-Jun-1980                  Patient Gender: M Patient Age:   79 years Exam Location:  [facility] Procedure:      VAS [place] LOWER EXTREMITY VENOUS (DVT) Referring Phys: Tereasa Coop --------------------------------------------------------------------------------  Indications: Swelling, and Edema.  Comparison Study: No prior exam. Performing Technologist: Fernande Bras  Examination Guidelines: A complete evaluation includes B-mode imaging, spectral Doppler, color Doppler, and power Doppler as needed of all accessible portions of each vessel. Bilateral testing is considered an integral part of a complete examination. Limited examinations for reoccurring indications may be performed as noted. The reflux portion of the exam is performed with the patient in reverse Trendelenburg.  +---------+---------------+---------+-----------+----------+--------------+ RIGHT    CompressibilityPhasicitySpontaneityPropertiesThrombus Aging +---------+---------------+---------+-----------+----------+--------------+ CFV      Full           Yes      Yes                                 +---------+---------------+---------+-----------+----------+--------------+ SFJ      Full           Yes      Yes                                 +---------+---------------+---------+-----------+----------+--------------+ FV Prox  Full                                                        +---------+---------------+---------+-----------+----------+--------------+ FV Mid   Full                                                        +---------+---------------+---------+-----------+----------+--------------+ FV DistalFull                                                        +---------+---------------+---------+-----------+----------+--------------+ PFV      Full                                                         +---------+---------------+---------+-----------+----------+--------------+ POP      Full           Yes      Yes                                 +---------+---------------+---------+-----------+----------+--------------+  PTV      Full                                                        +---------+---------------+---------+-----------+----------+--------------+ PERO     Full                                                        +---------+---------------+---------+-----------+----------+--------------+   +---------+---------------+---------+-----------+----------+--------------+ LEFT     CompressibilityPhasicitySpontaneityPropertiesThrombus Aging +---------+---------------+---------+-----------+----------+--------------+ CFV      Full           Yes      Yes                                 +---------+---------------+---------+-----------+----------+--------------+ SFJ      Full           Yes      Yes                                 +---------+---------------+---------+-----------+----------+--------------+ FV Prox  Full                                                        +---------+---------------+---------+-----------+----------+--------------+ FV Mid   Full                                                        +---------+---------------+---------+-----------+----------+--------------+ FV DistalFull                                                        +---------+---------------+---------+-----------+----------+--------------+ PFV      Full                                                        +---------+---------------+---------+-----------+----------+--------------+ POP      Full           Yes      Yes                                 +---------+---------------+---------+-----------+----------+--------------+ PTV      Full                                                         +---------+---------------+---------+-----------+----------+--------------+  PERO     Full                                                        +---------+---------------+---------+-----------+----------+--------------+     Summary: BILATERAL: - No evidence of deep vein thrombosis seen in the lower extremities, bilaterally. -No evidence of popliteal cyst, bilaterally.   *See table(s) above for measurements and observations. Electronically signed by Coral Else MD on 01/05/2023 at 8:29:35 PM.    Final    IR Radiologist Eval & Mgmt Result Date: 01/05/2023 INDICATION: Patient with history of end-stage cirrhosis, refractory ascites s/p tunneled peritoneal catheter placement 12/30/22 in IR. Patient admitted with abdominal pain and concern for malfunctioning Pleurx. EXAM: Pleurx catheter assessment TECHNIQUE: None CONTRAST:  None MEDICATIONS: None ANESTHESIA/SEDATION: None RADIOPHARMACEUTICALS:  None FLUOROSCOPY TIME:  None COMPARISON:  None FINDINGS: Pleurx insertion site clean, dry without erythema or edema. Retention suture is in tact. Pleurx accessed using sterile technique with immediate return of serosanguineous fluid. The catheter was attached to a Vacutainer and 8.0 L was removed. Sterile cap and dressing applied. PROCEDURE: See above. COMPLICATIONS: None immediate. IMPRESSION: Pleurx catheter accessed without issue. 8.0 L of serosanguineous fluid was removed which was the requested limit by the GI team. Performed by Lynnette Caffey, PA-C Electronically Signed   By: Irish Lack M.D.   On: 01/05/2023 14:12      Signature  -   Susa Raring M.D on 01/06/2023 at 10:56 AM   -  To page go to [email or web address]

## 2023-01-07 DIAGNOSIS — Z7189 Other specified counseling: Secondary | ICD-10-CM

## 2023-01-07 DIAGNOSIS — Z515 Encounter for palliative care: Secondary | ICD-10-CM

## 2023-01-07 LAB — COMPREHENSIVE METABOLIC PANEL
ALT: 12 U/L (ref 0–44)
AST: 22 U/L (ref 15–41)
Albumin: 4 g/dL (ref 3.5–5.0)
Alkaline Phosphatase: 56 U/L (ref 38–126)
Anion gap: 6 (ref 5–15)
BUN: 31 mg/dL — ABNORMAL HIGH (ref 6–20)
CO2: 18 mmol/L — ABNORMAL LOW (ref 22–32)
Calcium: 9.1 mg/dL (ref 8.9–10.3)
Chloride: 97 mmol/L — ABNORMAL LOW (ref 98–111)
Creatinine, Ser: 0.82 mg/dL (ref 0.61–1.24)
GFR, Estimated: >60 mL/min (ref >60)
Glucose, Bld: 128 mg/dL — ABNORMAL HIGH (ref 70–99)
Potassium: 4.1 mmol/L (ref 3.5–5.1)
Sodium: 121 mmol/L — ABNORMAL LOW (ref 135–145)
Total Bilirubin: 3.7 mg/dL — ABNORMAL HIGH (ref <1.2)
Total Protein: 5.1 g/dL — ABNORMAL LOW (ref 6.5–8.1)

## 2023-01-07 LAB — MAGNESIUM: Magnesium: 2.4 mg/dL (ref 1.7–2.4)

## 2023-01-07 LAB — CBC WITH DIFFERENTIAL/PLATELET
Abs Immature Granulocytes: 0.02 10*3/uL (ref 0.00–0.07)
Basophils Absolute: 0 10*3/uL (ref 0.0–0.1)
Basophils Relative: 0 %
Eosinophils Absolute: 0.1 10*3/uL (ref 0.0–0.5)
Eosinophils Relative: 1 %
HCT: 22.8 % — ABNORMAL LOW (ref 39.0–52.0)
Hemoglobin: 7.9 g/dL — ABNORMAL LOW (ref 13.0–17.0)
Immature Granulocytes: 0 %
Lymphocytes Relative: 10 %
Lymphs Abs: 0.5 10*3/uL — ABNORMAL LOW (ref 0.7–4.0)
MCH: 34.5 pg — ABNORMAL HIGH (ref 26.0–34.0)
MCHC: 34.6 g/dL (ref 30.0–36.0)
MCV: 99.6 fL (ref 80.0–100.0)
Monocytes Absolute: 0.7 10*3/uL (ref 0.1–1.0)
Monocytes Relative: 14 %
Neutro Abs: 4 10*3/uL (ref 1.7–7.7)
Neutrophils Relative %: 75 %
Platelets: 50 10*3/uL — ABNORMAL LOW (ref 150–400)
RBC: 2.29 MIL/uL — ABNORMAL LOW (ref 4.22–5.81)
RDW: 14 % (ref 11.5–15.5)
WBC: 5.3 10*3/uL (ref 4.0–10.5)
nRBC: 0 % (ref 0.0–0.2)

## 2023-01-07 LAB — AMMONIA: Ammonia: 55 umol/L — ABNORMAL HIGH (ref 9–35)

## 2023-01-07 LAB — PHOSPHORUS: Phosphorus: 4.3 mg/dL (ref 2.5–4.6)

## 2023-01-07 LAB — BRAIN NATRIURETIC PEPTIDE: B Natriuretic Peptide: 210 pg/mL — ABNORMAL HIGH (ref 0.0–100.0)

## 2023-01-07 MED ORDER — PROCHLORPERAZINE EDISYLATE 10 MG/2ML IJ SOLN
5.0000 mg | INTRAMUSCULAR | Status: DC | PRN
  Administered 2023-01-07 – 2023-01-08 (×2): 5 mg via INTRAVENOUS
  Filled 2023-01-07 (×2): qty 2

## 2023-01-07 MED ORDER — ALBUMIN HUMAN 25 % IV SOLN
50.0000 g | Freq: Three times a day (TID) | INTRAVENOUS | Status: AC
  Administered 2023-01-07 (×3): 50 g via INTRAVENOUS
  Filled 2023-01-07 (×3): qty 200

## 2023-01-07 NOTE — Progress Notes (Signed)
PROGRESS NOTE                                                                                                                                                                                                             Patient Demographics:    Mathew [name], is a 42 y.o. male, DOB - 26-Jan-1980, FAO:[id number]  Outpatient Primary MD for the patient is [name], MD    LOS - 2  Admit date - 01/04/2023    Chief Complaint  Patient presents with   Abdominal Pain   Leg Pain    right       Brief Narrative (HPI from H&P)      42 y.o. 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    Last drink yesterday 12/24. Patient reported generalized abdominal pain which is chronic in nature.  However he is endorsing generalized weakness.  Patient is currently on hospice care stated that on daily basis he has almost 1 L of output through the abdominal drain. Currently patient is on hospice care for end-stage liver disease and hospice nurse coming to patient so managing and draining the abdominal Pleurx catheter.  Patient is also DNR and DNI based on palliative discussion during last hospital admission.   Subjective:   Patient in bed, appears comfortable, denies any headache, no fever, no chest pain or pressure, no shortness of breath , no abdominal pain. No focal weakness.   Assessment  & Plan :   Patient is home hospice.  All  treatment currently is palliative, he is DNR.     End-stage liver disease caused by alcoholic cirrhosis, ongoing alcohol abuse which continues.  Here with massive ascites and abdominal pain, has indwelling abdominal Pleurx catheter, patient is terminal and with home hospice at home, counseled strictly to abstain from alcohol, 8 L of peritoneal fluid removed via his Pleurx catheter on 01/05/2023, 7 L on 01/06/2023, along with midodrine , IV fluids and albumin.  For now empiric  IV for SBP for 5 days.  Palliative care also to provide input prognosis is extremely poor.  Will try and remove another 7-8 units on a daily basis for the next 2 to 3 days as he clearly has about 20 to 30 L of peritoneal fluid.    Of note looks like his home hospice team was not aware that patient had a Pleurx catheter and hence he did not undergo any paracentesis at home, they have been now informed that he has a Pleurx catheter that can be used at home in the future.   Hyponatremia, hyperkalemia, AKI.  Hold ACE inhibitor.  Hold diuretics for now, fluid removal via abdominal Pleurx catheter, IV albumin, midodrine and IV fluids as needed while removing fluid.  Lokelma for hyperkalemia, could be developing hepatorenal syndrome, prognosis again is extremely poor.  Thrombocytopenia and anemia of chronic disease.  Due to #1 above.  Supportive care.      Condition - Extremely Guarded  Family Communication  :  None  Code Status :  DNR  Consults  :  IR, GI, Pall care  PUD Prophylaxis :     Procedures  :     Ascites fluid removal via Pleurx catheter on 01/05/2023.  8 L removed, 01/06/2023 7 L, will attempt another 7 to 8 L on 01/07/2023      Disposition Plan  :    Status is: Observation   DVT Prophylaxis  :    heparin injection 5,000 Units Start: 01/05/23 1000 SCDs Start: 01/04/23 2324 Place TED hose Start: 01/04/23 2324   Lab Results  Component Value Date   PLT 50 (L) 01/07/2023    Diet :  Diet Order              Diet Heart Room service appropriate? Yes; Fluid consistency: Thin; Fluid restriction: 1200 mL Fluid  Diet effective now                    Inpatient Medications  Scheduled Meds:  docusate sodium  100 mg Oral BID   fentaNYL  1 patch Transdermal Q72H   folic acid  1 mg Oral Daily   heparin injection (subcutaneous)  5,000 Units Subcutaneous Q12H   lactulose  30 g Oral TID   midodrine  10 mg Oral TID WC   multivitamin with minerals  1 tablet Oral Daily   rifaximin  550 mg Oral BID   thiamine  100 mg Oral Daily   Or   thiamine  100 mg Intravenous Daily   Continuous Infusions:  albumin human 50 g (01/07/23 0604)   cefTRIAXone (ROCEPHIN)  IV Stopped (01/06/23 2055)   sodium chloride 500 mL (01/05/23 1204)   sodium chloride     PRN Meds:.HYDROmorphone (DILAUDID) injection, HYDROmorphone, ondansetron (ZOFRAN) IV, sodium chloride, sodium chloride  Antibiotics  :    Anti-infectives (From admission, onward)    Start     Dose/Rate Route Frequency Ordered Stop   01/05/23 1130  rifaximin (XIFAXAN) tablet 550 mg        550 mg Oral 2 times daily 01/05/23 1033     01/05/23 1130  cefTRIAXone (ROCEPHIN) 2 g in sodium chloride 0.9 % 100 mL IVPB        2 g 200 mL/hr over 30 Minutes Intravenous Every 24 hours 01/05/23 1034 01/10/23 1129         Objective:   Vitals:   01/06/23 2345 01/07/23 0322 01/07/23 0800 01/07/23 0801  BP: 110/62 116/68 121/67  Pulse: 92 98 99 95  Resp: 14 15 13 11   Temp: 97.6 F (36.4 C) 98.1 F (36.7 C) 98.4 F (36.9 C)   TempSrc: Axillary Oral Oral   SpO2: 100% 100% 100% 100%  Weight:  77.1 kg    Height:        Wt Readings from Last 3 Encounters:  01/07/23 77.1 kg  12/06/22 85 kg  12/05/22 84.6 kg     Intake/Output Summary (Last 24 hours) at 01/07/2023 1113 Last data filed at 01/06/2023 1411 Gross per 24 hour  Intake --  Output 6500 ml  Net -6500 ml      Physical Exam  Awake Alert, No new F.N deficits, Normal  affect [place].AT,PERRAL Supple Neck, No JVD,   Symmetrical Chest wall movement, Good air movement bilaterally, CTAB RRR,No Gallops,Rubs or new Murmurs,  +ve B.Sounds, Abd is distended with severe ascites, Pleurx catheter in place No Cyanosis, Clubbing or edema       Data Review:    Recent Labs  Lab 01/04/23 2217 01/05/23 0511 01/07/23 0334  WBC 4.4 4.2 5.3  HGB 8.3* 8.7* 7.9*  HCT 23.9* 24.2* 22.8*  PLT 67* 64* 50*  MCV 99.6 98.8 99.6  MCH 34.6* 35.5* 34.5*  MCHC 34.7 36.0 34.6  RDW 14.2 14.3 14.0  LYMPHSABS 0.5*  --  0.5*  MONOABS 0.6  --  0.7  EOSABS 0.1  --  0.1  BASOSABS 0.0  --  0.0    Recent Labs  Lab 01/04/23 2217 01/05/23 0511 01/06/23 0559 01/06/23 0835 01/07/23 0334  NA 116* 116*  --  118* 121*  K 5.0 5.4*  --  4.6 4.1  CL 92* 92*  --  93* 97*  CO2 17* 18*  --  20* 18*  ANIONGAP 7 6  --  5 6  GLUCOSE 133* 117*  --  117* 128*  BUN 32* 31*  --  29* 31*  CREATININE 0.82 0.83  --  0.84 0.82  AST 43* 43*  --  27 22  ALT 23 24  --  14 12  ALKPHOS 145* 135*  --  72 56  BILITOT 3.0* 3.4*  --  3.0* 3.7*  ALBUMIN 2.5* 2.4*  --  3.9 4.0  INR  --  1.5*  --   --   --   AMMONIA  --  53* 87*  --  55*  BNP  --   --  677.8*  --  210.0*  MG  --   --   --  2.1 2.4  CALCIUM 8.1* 8.0*  --  8.5* 9.1     Recent Labs  Lab 01/04/23 2217 01/05/23 0511 01/06/23 0559 01/06/23 0835 01/07/23 0334  INR  --  1.5*  --   --   --   AMMONIA  --  53* 87*  --  55*  BNP  --   --  677.8*  --  210.0*  MG  --   --   --  2.1 2.4  CALCIUM 8.1* 8.0*  --  8.5* 9.1    --------------------------------------------------------------------------------------------------------------- No results found for: "CHOL", "HDL", "LDLCALC", "LDLDIRECT", "TRIG", "CHOLHDL"  Lab Results  Component Value Date   HGBA1C 4.7 (L) 10/14/2019    Micro Results No results found for this or any previous visit (from the past 240 hours).   Radiology Reports VAS [place] LOWER EXTREMITY VENOUS (DVT) Result  Date: 01/05/2023  Lower Venous DVT Study Patient Name:  Mathew [name]  Date of Exam:  01/05/2023 Medical Rec #: [id number]                  Accession #:    [id number] Date of Birth: 06/05/1980                  Patient Gender: M Patient Age:   73 years Exam Location:  [facility] Procedure:      VAS [place] LOWER EXTREMITY VENOUS (DVT) Referring Phys: Eulah Pont SUNDIL --------------------------------------------------------------------------------  Indications: Swelling, and Edema.  Comparison Study: No prior exam. Performing Technologist: Fernande Bras  Examination Guidelines: A complete evaluation includes B-mode imaging, spectral Doppler, color Doppler, and power Doppler as needed of all accessible portions of each vessel. Bilateral testing is considered an integral part of a complete examination. Limited examinations for reoccurring indications may be performed as noted. The reflux portion of the exam is performed with the patient in reverse Trendelenburg.  +---------+---------------+---------+-----------+----------+--------------+ RIGHT    CompressibilityPhasicitySpontaneityPropertiesThrombus Aging +---------+---------------+---------+-----------+----------+--------------+ CFV      Full           Yes      Yes                                 +---------+---------------+---------+-----------+----------+--------------+ SFJ      Full           Yes      Yes                                 +---------+---------------+---------+-----------+----------+--------------+ FV Prox  Full                                                        +---------+---------------+---------+-----------+----------+--------------+ FV Mid   Full                                                        +---------+---------------+---------+-----------+----------+--------------+ FV DistalFull                                                         +---------+---------------+---------+-----------+----------+--------------+ PFV      Full                                                        +---------+---------------+---------+-----------+----------+--------------+ POP      Full           Yes      Yes                                 +---------+---------------+---------+-----------+----------+--------------+ PTV      Full                                                        +---------+---------------+---------+-----------+----------+--------------+  PERO     Full                                                        +---------+---------------+---------+-----------+----------+--------------+   +---------+---------------+---------+-----------+----------+--------------+ LEFT     CompressibilityPhasicitySpontaneityPropertiesThrombus Aging +---------+---------------+---------+-----------+----------+--------------+ CFV      Full           Yes      Yes                                 +---------+---------------+---------+-----------+----------+--------------+ SFJ      Full           Yes      Yes                                 +---------+---------------+---------+-----------+----------+--------------+ FV Prox  Full                                                        +---------+---------------+---------+-----------+----------+--------------+ FV Mid   Full                                                        +---------+---------------+---------+-----------+----------+--------------+ FV DistalFull                                                        +---------+---------------+---------+-----------+----------+--------------+ PFV      Full                                                        +---------+---------------+---------+-----------+----------+--------------+ POP      Full           Yes      Yes                                  +---------+---------------+---------+-----------+----------+--------------+ PTV      Full                                                        +---------+---------------+---------+-----------+----------+--------------+ PERO     Full                                                        +---------+---------------+---------+-----------+----------+--------------+  Summary: BILATERAL: - No evidence of deep vein thrombosis seen in the lower extremities, bilaterally. -No evidence of popliteal cyst, bilaterally.   *See table(s) above for measurements and observations. Electronically signed by Coral Else MD on 01/05/2023 at 8:29:35 PM.    Final    IR Radiologist Eval & Mgmt Result Date: 01/05/2023 INDICATION: Patient with history of end-stage cirrhosis, refractory ascites s/p tunneled peritoneal catheter placement 12/30/22 in IR. Patient admitted with abdominal pain and concern for malfunctioning Pleurx. EXAM: Pleurx catheter assessment TECHNIQUE: None CONTRAST:  None MEDICATIONS: None ANESTHESIA/SEDATION: None RADIOPHARMACEUTICALS:  None FLUOROSCOPY TIME:  None COMPARISON:  None FINDINGS: Pleurx insertion site clean, dry without erythema or edema. Retention suture is in tact. Pleurx accessed using sterile technique with immediate return of serosanguineous fluid. The catheter was attached to a Vacutainer and 8.0 L was removed. Sterile cap and dressing applied. PROCEDURE: See above. COMPLICATIONS: None immediate. IMPRESSION: Pleurx catheter accessed without issue. 8.0 L of serosanguineous fluid was removed which was the requested limit by the GI team. Performed by Lynnette Caffey, PA-C Electronically Signed   By: Irish Lack M.D.   On: 01/05/2023 14:12      Signature  -   Susa Raring M.D on 01/07/2023 at 11:13 AM   -  To page go to [email or web address]

## 2023-01-07 NOTE — Progress Notes (Signed)
[facility] hospitalized hospice patient  Mathew [name] is a current hospice patient with [facility] with terminal diagnosis of alcoholic cirrhosis who was admitted to service on 12.22 with pleurex catheter. His catheter was drained that day of 1L, he refused drainage on 12.23, on 12.24 he was seen again and was drained of . At that time he informed nurse he only wanted to be drained weekly. He presented to the ED on 12.25 in the evening and 12.25 with complaints of increased abdominal pain and was admitted to inpatient with diagnosis of end stage liver disease.   Patient remains inpatient appropriate due to the need for removal of another 8 units on a daily basis for the next 2 to 3 days as he clearly has about 20 to 30 L of peritoneal fluid.   V/S:? 98, 95, 15, 122/71, 100 on RA  ?I/O:??480 ml PO/ 13, (peritoneal fluid)  ?Abnormal?Labs:?  Sodium 135 - 145 mmol/L 121 (L)  Chloride 98 - 111 mmol/L 97 (L)  CO2 22 - 32 mmol/L 18 (L)  Glucose 70 - 99 mg/dL 324 (H)  BUN 6 - 20 mg/dL 31 (H)  Total Protein 6.5 - 8.1 g/dL 5.1 (L)  Ammonia 9 - 35 umol/L 55 (H)  Total Bilirubin <1.2 mg/dL 3.7 (H)  B Natriuretic Peptide 0.0 - 100.0 pg/mL 210.0 (H)  RBC 4.22 - 5.81 MIL/uL 2.29 (L)  Hemoglobin 13.0 - 17.0 g/dL 7.9 (L)  HCT 40.1 - 02.7 % 22.8 (L)  MCH 26.0 - 34.0 pg 34.5 (H)  Platelets 150 - 400 K/uL 50 (L)  Lymphs Abs 0.7 - 4.0 K/uL 0.5 (L)    Diagnostics:?None  ?IV/PRN:?IV thiamine 100 mg daily, IV albumin 50 g every 8 hours, IV rocephin 2 g every 24 hours, sodium chloride 500 ml 2 times daily, IV dialudid 1 mg PRN every 4hours doses x2 3:28am and 6:54 am  ?  Problem List: Patient is home hospice.  All treatment currently is palliative, he is DNR.       End-stage liver disease caused by alcoholic cirrhosis, ongoing alcohol abuse which continues.  Here with massive ascites and abdominal pain, has indwelling abdominal Pleurx catheter, patient is terminal and with  home hospice at home, counseled strictly to abstain from alcohol, 8 L of peritoneal fluid removed via his Pleurx catheter on 01/05/2023, 7 L on 01/06/2023, along with midodrine , IV fluids and albumin.  For now empiric IV for SBP for 5 days.  Palliative care also to provide input prognosis is extremely poor.  Will try and remove another 7-8 units on a daily basis for the next 2 to 3 days as he clearly has about 20 to 30 L of peritoneal fluid.     Of note looks like his home hospice team was not aware that patient had a Pleurx catheter and hence he did not undergo any paracentesis at home, they have been now informed that he has a Pleurx catheter that can be used at home in the future.   Hyponatremia, hyperkalemia, AKI.  Hold ACE inhibitor.  Hold diuretics for now, fluid removal via abdominal Pleurx catheter, IV albumin, midodrine and IV fluids as needed while removing fluid.  Lokelma for hyperkalemia, could be developing hepatorenal syndrome, prognosis again is extremely poor.   Thrombocytopenia and anemia of chronic disease.  Due to #1 above.  Supportive care.  ?  Discharge Planning: Return home once medically ready   ?Family Contact:? Patient  IDT:?Updated   Goals of Care:  DNR  If ambulance transportation is needed please use [facility] for [facility] hospice patients.   Please call with any questions or concerns. Thank you  Dionicio Stall, LCSW [facility] hospital liaison [phone]

## 2023-01-07 NOTE — Plan of Care (Signed)

## 2023-01-07 NOTE — Plan of Care (Signed)
  Problem: Health Behavior/Discharge Planning: Goal: Ability to manage health-related needs will improve Outcome: Progressing   Problem: Clinical Measurements: Goal: Ability to maintain clinical measurements within normal limits will improve Outcome: Progressing Goal: Will remain free from infection Outcome: Progressing   Problem: Activity: Goal: Risk for activity intolerance will decrease Outcome: Progressing   Problem: Pain Management: Goal: General experience of comfort will improve Outcome: Progressing   Problem: Safety: Goal: Ability to remain free from injury will improve Outcome: Progressing

## 2023-01-07 NOTE — Consult Note (Incomplete)
Palliative Care Consult Note                                  Date: 01/07/2023   Patient Name: Mathew [name]  DOB: 12-17-1980  MRN: [id number]  Age / Sex: 42 y.o., male  PCP: [name], MD Referring Physician: Leroy Sea, MD  Reason for Consultation: {Reason for Consult:23484}  HPI/Patient Profile: 42 y.o. 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  He was recently admitted to [facility] on 12/26/2022, underwent abdominal Pleurx catheter placement on 12/30/2022, and discharged home with hospice on 12/31/2022.  He was readmitted on 01/04/2023 with abdominal pain secondary to ascites.  Palliative Medicine was consulted for goals of care. Patient is known to PMT from his recent hospitalization.    Past Medical History:  Diagnosis Date   Acute metabolic encephalopathy 06/20/2021   Alcohol withdrawal seizure (HCC)    Cirrhosis (HCC)    ETOH abuse    Pneumonia 04/02/2020   SBP (spontaneous bacterial peritonitis) (HCC) 06/17/2021    Subjective:   I have reviewed medical records including EPIC notes, labs and imaging.  I met with patient at bedside to discuss diagnosis, prognosis, GOC, EOL wishes, disposition, and options.  Video interpreter [phone]) was used throughout our discussion.   I introduced myself and re-introduced the role of palliative care.   He reports 9/10 abdominal pain. He is drowsy, but able to participate in discussion. He is not able to verbalize much about his current medical situation, other than he has "fluid that needs to be drained".   I reviewed with patient that he has end-stage liver failure  We discussed patient's current illness and what it means in the larger context of her ongoing co-morbidities. Current clinical status was  reviewed. Natural disease trajectory of *** was discussed.  Created space and opportunity for patient and family to explore thoughts and feelings regarding current medical situation.  Values and goals of care important to patient and family were attempted to be elicited.  Questions and concerns addressed. Patient/family encouraged to call with questions or concerns.    Review of Systems  Objective:   Primary Diagnoses: Present on Admission:  Alcohol use disorder  Acute on chronic hyponatremia  Alcoholic cirrhosis (HCC)  Refractory ascites   Physical Exam  Vital Signs:  BP 122/71   Pulse 98   Temp 98.4 F (36.9 C) (Oral)   Resp 15   Ht 5\' 5"  (1.651 m)   Wt 77.1 kg   SpO2 100%   BMI 28.29 kg/m   Palliative Assessment/Data: ***     Assessment & Plan:   SUMMARY OF RECOMMENDATIONS   Continue current supportive interventions   Primary Decision Maker: {Primary Decision NTIRW:43154}  Code Status/Advance Care Planning: {Palliative Code status:23503}  Symptom Management:  ***  Prognosis:  {Palliative Care Prognosis:23504}  Discharge Planning:  {Palliative dispostion:23505}   Discussed with: ***    Thank you for allowing [place] to participate in the care of [employer] [name]   Time Total: ***  Greater than 50%  of this time was spent counseling and coordinating care related to the above assessment and plan.  Signed by: Sherlean Foot, NP Palliative Medicine Team  Team Phone # [phone]  For individual providers, please see AMION

## 2023-01-08 DIAGNOSIS — N179 Acute kidney failure, unspecified: Secondary | ICD-10-CM

## 2023-01-08 DIAGNOSIS — R197 Diarrhea, unspecified: Secondary | ICD-10-CM

## 2023-01-08 LAB — COMPREHENSIVE METABOLIC PANEL
ALT: 11 U/L (ref 0–44)
AST: 20 U/L (ref 15–41)
Albumin: 4.8 g/dL (ref 3.5–5.0)
Alkaline Phosphatase: 40 U/L (ref 38–126)
Anion gap: 10 (ref 5–15)
BUN: 36 mg/dL — ABNORMAL HIGH (ref 6–20)
CO2: 18 mmol/L — ABNORMAL LOW (ref 22–32)
Calcium: 9.5 mg/dL (ref 8.9–10.3)
Chloride: 94 mmol/L — ABNORMAL LOW (ref 98–111)
Creatinine, Ser: 1.05 mg/dL (ref 0.61–1.24)
GFR, Estimated: >60 mL/min (ref >60)
Glucose, Bld: 142 mg/dL — ABNORMAL HIGH (ref 70–99)
Potassium: 3.9 mmol/L (ref 3.5–5.1)
Sodium: 122 mmol/L — ABNORMAL LOW (ref 135–145)
Total Bilirubin: 3.5 mg/dL — ABNORMAL HIGH (ref <1.2)
Total Protein: 5.8 g/dL — ABNORMAL LOW (ref 6.5–8.1)

## 2023-01-08 LAB — AMMONIA: Ammonia: 40 umol/L — ABNORMAL HIGH (ref 9–35)

## 2023-01-08 LAB — CBC WITH DIFFERENTIAL/PLATELET
Abs Immature Granulocytes: 0.02 10*3/uL (ref 0.00–0.07)
Basophils Absolute: 0 10*3/uL (ref 0.0–0.1)
Basophils Relative: 0 %
Eosinophils Absolute: 0.1 10*3/uL (ref 0.0–0.5)
Eosinophils Relative: 1 %
HCT: 22.4 % — ABNORMAL LOW (ref 39.0–52.0)
Hemoglobin: 8 g/dL — ABNORMAL LOW (ref 13.0–17.0)
Immature Granulocytes: 0 %
Lymphocytes Relative: 3 %
Lymphs Abs: 0.2 10*3/uL — ABNORMAL LOW (ref 0.7–4.0)
MCH: 35.2 pg — ABNORMAL HIGH (ref 26.0–34.0)
MCHC: 35.7 g/dL (ref 30.0–36.0)
MCV: 98.7 fL (ref 80.0–100.0)
Monocytes Absolute: 0.5 10*3/uL (ref 0.1–1.0)
Monocytes Relative: 8 %
Neutro Abs: 5.8 10*3/uL (ref 1.7–7.7)
Neutrophils Relative %: 88 %
Platelets: 47 10*3/uL — ABNORMAL LOW (ref 150–400)
RBC: 2.27 MIL/uL — ABNORMAL LOW (ref 4.22–5.81)
RDW: 13.9 % (ref 11.5–15.5)
WBC: 6.6 10*3/uL (ref 4.0–10.5)
nRBC: 0 % (ref 0.0–0.2)

## 2023-01-08 LAB — MAGNESIUM: Magnesium: 2.6 mg/dL — ABNORMAL HIGH (ref 1.7–2.4)

## 2023-01-08 LAB — BRAIN NATRIURETIC PEPTIDE: B Natriuretic Peptide: 50.7 pg/mL (ref 0.0–100.0)

## 2023-01-08 LAB — PHOSPHORUS: Phosphorus: 4.7 mg/dL — ABNORMAL HIGH (ref 2.5–4.6)

## 2023-01-08 MED ORDER — METOPROLOL TARTRATE 5 MG/5ML IV SOLN: 5.0000 mg | Freq: Once | INTRAVENOUS | Status: DC | PRN

## 2023-01-08 MED ORDER — METOPROLOL TARTRATE 5 MG/5ML IV SOLN
5.0000 mg | Freq: Once | INTRAVENOUS | Status: AC
  Administered 2023-01-08: 5 mg via INTRAVENOUS
  Filled 2023-01-08: qty 5

## 2023-01-08 MED ORDER — LACTULOSE 10 GM/15ML PO SOLN
30.0000 g | Freq: Every day | ORAL | Status: DC
Start: 2023-01-09 — End: 2023-01-09
  Administered 2023-01-09: 30 g via ORAL
  Filled 2023-01-08: qty 60

## 2023-01-08 NOTE — Progress Notes (Signed)
Patient becoming increasingly more lethargic and more difficult to arouse since beginning of this shift.  Patient has had no PO intake so far this shift.

## 2023-01-08 NOTE — Progress Notes (Signed)
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  Per Dr. Kern Reap, hospice MD, this is a related hospital admission.  Spoke with hospital team today regarding discharge plan and all agree transition to our Hospice InPatient Unit for symptom management of pain, nausea and drainage/management of abdominal fluid would benefit the patient with better management of his symptoms.   Patient  mentation is decreasing and medical team does not feel he is able to make sound decision on his health.  I called and spoke with patient's brother, via phone, with Spanish interpreter.  He is in agreement with and is okay to proceed with transfer to InPatient Unit either today or tomorrow.    Patient spiked a fever later this afternoon and MD wanted to hold off on transfer until tomorrow to see if this would change the plan of care and to be able to speak with patient's brother.  Patient has been approved by Dr. Kern Reap for [facility] for GIP Level of care.  Hospice is ready to take patient at [facility]/IPU when hospital is ready for discharge.  Patient remains appropriate for hospital Inpatient requiring frequent IV medication for symptoms of pain and nausea.    Vital Signs:  T 100.9, BP 103/40, P106, R 13, SPO2 100% on RA.  Abnormal Labs: no new today  Intake:  Nothing by mouth since 12.26.24  Diagnostics:  No new today  Hospital Problems: per Dr. Hazeline Junker progress note 12.29.24 Assessment & Plan: Patient is home hospice.  All treatment currently is palliative, he is DNR.   - Patient not taking any po. I doubt he is able to be managed successfully at home even with purely palliative intent. I believe his life expectancy is limited to days, less than 2 weeks.  Palliative following and he is now accepted to inpatient/residential hospice unit per my discussions. We will pursue placement there once we are able to confirm pt/family consent and bed availability. 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  - Continue daily removal of fluid. Aim for more frequent lower volume to minimize risk of HRS and hemodynamic compromise.  - Continue CTX for SBP empirically. Pt does have a fever today, will check blood cultures. - Continue lactulose, rifaximin.     Hyponatremia, hyperkalemia, AKI.  - Hold ACE inhibitor.  - Hold diuretics for now, fluid removal via abdominal Pleurx catheter,  - prognosis again is extremely poor.   Thrombocytopenia and anemia of chronic disease, lymphopenia.  Due to #1 above.  Supportive care. Avoid anticoagulation since platelets are < 50k and we are nearing transition to residential hospice.  Discharge Plan:  DC to IPU/[facility] tomorrow if hospital is ready for discharge. IDT:  Updated Goals of Care:  clear Family Contact:  Brother, Mathew [name] via telephone call with interpreter (Spanish)  He is in agreement with transfer to the [facility].  Please  do not hesitate to call with any hospice related questions or concerns.  Norris Cross, RN Nurse Liaison [phone]

## 2023-01-08 NOTE — Progress Notes (Signed)
Patient medicated with IV zofran at this time for persistent nausea.  Patient unable to eat breakfast this AM or take his PO medications.

## 2023-01-08 NOTE — Progress Notes (Signed)
Patient ID: Mathew [name], male   DOB: 08/29/1980, 42 y.o.   MRN: [id number]    Progress Note   Subjective   Day # 4 CC; severely decompensated EtOH related cirrhosis with refractory ascites, Hospice patient, status post Pleurx catheter 12/30/2022  IV Rocephin day 4    Labs today WBC 6.6/hemoglobin 8.0/hematocrit 22.4/platelets 47 BNP 50 Sodium 122/potassium 3.9/BUN 36/creatinine 1.05 T. bili 3.5/alk phos 40/AST 20/ALT 11  Ammonia 40  Patient's aunt is at bedside today, she says he has not had anything to eat or drink since yesterday, has mostly been sleeping Had 1 large diarrheal stool this morning Patient's nurse reports that she did take off 6 L of fluid yesterday, no orders for fluid removal via Pleurx today.  More somnolent all day today    Objective   Vital signs in last 24 hours: Temp:  [97.6 F (36.4 C)-100.9 F (38.3 C)] 100.9 F (38.3 C) (12/29 0900) Pulse Rate:  [95-109] 106 (12/29 1000) Resp:  [9-18] 13 (12/29 1000) BP: (90-126)/(42-81) 103/50 (12/29 0900) SpO2:  [100 %] 100 % (12/29 1000) Weight:  [68.7 kg] 68.7 kg (12/29 0308)   General:    Very ill-appearing Hispanic male in NAD, try to open eyes and nodded but did not answer any questions Heart: Tachycardic regular rate and rhythm; no murmurs Lungs: Respirations even and unlabored, lungs CTA bilaterally Abdomen: Abdomen much softer than when last seen 48 hours ago, no tenderness, nontense ascites Extremities:  Without edema. Neurologic: Somnolent, nods head but did not answer verbally, cannot demonstrate any asterixis Psych:  Cooperative  Intake/Output from previous day: 12/28 0701 - 12/29 0700 In: -  Out: 6600 [Urine:100] Intake/Output this shift: No intake/output data recorded.  Lab Results: Recent Labs    01/07/23 0334 01/08/23 0333  WBC 5.3 6.6  HGB 7.9* 8.0*  HCT 22.8* 22.4*  PLT 50* 47*   BMET Recent Labs    01/06/23 0835 01/07/23 0334 01/08/23 0333  NA 118* 121*  122*  K 4.6 4.1 3.9  CL 93* 97* 94*  CO2 20* 18* 18*  GLUCOSE 117* 128* 142*  BUN 29* 31* 36*  CREATININE 0.84 0.82 1.05  CALCIUM 8.5* 9.1 9.5   LFT Recent Labs    01/08/23 0333  PROT 5.8*  ALBUMIN 4.8  AST 20  ALT 11  ALKPHOS 40  BILITOT 3.5*   PT/INR No results for input(s): "LABPROT", "INR" in the last 72 hours.    Assessment / Plan:    #4 42 year old Hispanic male, with severely decompensated EtOH induced cirrhosis, with massive refractory ascites. Patient had Pleurx catheter placed on 12/30/2022 during his last admission, and was discharged home with home hospice. Readmitted on 01/04/2023 with generalized abdominal pain and weakness He has had aggressive supportive management since, treating empirically for SBP x 5 days He had a large-volume paracentesis on 01/05/2023 with 8 L removed, and he has been having serial fluid removal he had a Pleurx daily to every other day. Fluid removed today, abdomen is much softer  Patient is much more somnolent today and has not eaten since yesterday  #2 diarrhea-will decrease lactulose to once daily, not sure where accomplishing much by trying to treat hepatic encephalopathy  #3 severe hyponatremia stable, some mild improvement #4 acute kidney injury parameters improved   Plan; nothing to add from GI perspective, believe we should focus just on comfort care at this point Decrease lactulose to once daily Fluid removal via Pleurx catheter every other day as needed no  more than 8 L He has had a change in status since yesterday, much more somnolent and not eating he is likely approaching his last days GI will be available  Principal Problem:   Acute on chronic hyponatremia Active Problems:   Alcoholic cirrhosis (HCC)   Alcohol use disorder   Refractory ascites   End stage liver disease (HCC)   Chronic anemia   Chronic idiopathic thrombocytopenia (HCC)     LOS: 3 days   Malic Rosten EsterwoodPA-C  01/08/2023, 11:18 AM

## 2023-01-08 NOTE — Plan of Care (Signed)
°  Problem: Health Behavior/Discharge Planning: Goal: Ability to manage health-related needs will improve Outcome: Progressing   Problem: Clinical Measurements: Goal: Ability to maintain clinical measurements within normal limits will improve Outcome: Progressing   Problem: Pain Management: Goal: General experience of comfort will improve Outcome: Progressing   Problem: Safety: Goal: Ability to remain free from injury will improve Outcome: Progressing

## 2023-01-08 NOTE — Progress Notes (Signed)
[facility] PROGRESS NOTE  Mathew [name] (DOB: [birth year]) ZOX:[id number] PCP: [name], MD Outpatient Specialists: [facility]   Brief Narrative: 42 y.o. 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     Last drink yesterday 12/24. Patient reported generalized abdominal pain which is chronic in nature.  However he is endorsing generalized weakness.   Patient is currently on hospice care stated that on daily basis he has almost 1 L of output through the abdominal drain. Currently patient is on hospice care for end-stage liver disease and hospice nurse coming to patient so managing and draining the abdominal Pleurx catheter.  Patient is also DNR and DNI based on palliative discussion during last hospital admission.  Subjective: Pt difficult to rouse but does rouse and denies any concerns. Denies hunger, hasn't eaten today.  Objective: BP (!) 103/50 (BP Location: Left Arm)   Pulse (!) 106   Temp (!) 100.9 F (38.3 C) (Oral)   Resp 13   Ht 5\' 5"  (1.651 m)   Wt 68.7 kg   SpO2 100%   BMI 25.20 kg/m   Gen: Ill-appearing jaundiced male in no distress Pulm: Clear, nonlabored  CV: RRR, no MRG, no significant edema GI: Less distended than previous exams report, still +fluid wave. Does not appear to be tender, not taut, no erythema.  Neuro: Lethargic limiting exam Ext: Warm, dry Skin: Pleurx catheter site c/d/i   Assessment & Plan: Patient is home  hospice.  All treatment currently is palliative, he is DNR.   - Patient not taking any po. I doubt he is able to be managed successfully at home even with purely palliative intent. I believe his life expectancy is limited to days, less than 2 weeks. Palliative following and he is now accepted to inpatient/residential hospice unit per my discussions. We will pursue placement there once we are able to confirm pt/family consent and bed availability. 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  - Continue daily removal of fluid. Aim for more frequent lower volume to minimize risk of HRS and hemodynamic compromise.  - Continue CTX for SBP empirically. Pt does have a fever today, will check blood cultures. - Continue lactulose, rifaximin.     Hyponatremia, hyperkalemia, AKI.  - Hold ACE inhibitor.  - Hold diuretics for now, fluid removal via abdominal Pleurx catheter,  - Continue IV albumin, midodrine and IV fluids as needed while removing fluid.  - Continue lokelma for hyperkalemia, could be developing hepatorenal syndrome, Cr 0.8 > 1.02, prognosis again is extremely poor.   Thrombocytopenia and anemia of chronic disease, lymphopenia.  Due to #1 above.  Supportive care. Avoid anticoagulation since platelets are < 50k and we are nearing transition to residential hospice.  Tyrone Nine, MD [facility] [email or web address] 01/08/2023, 1:49 PM

## 2023-01-08 NOTE — Plan of Care (Signed)
  Problem: Education: Goal: Knowledge of General Education information will improve Description: Including pain rating scale, medication(s)/side effects and non-pharmacologic comfort measures Outcome: Not Progressing   

## 2023-01-09 LAB — COMPREHENSIVE METABOLIC PANEL
ALT: 11 U/L (ref 0–44)
AST: 24 U/L (ref 15–41)
Albumin: 3.5 g/dL (ref 3.5–5.0)
Alkaline Phosphatase: 36 U/L — ABNORMAL LOW (ref 38–126)
Anion gap: 7 (ref 5–15)
BUN: 40 mg/dL — ABNORMAL HIGH (ref 6–20)
CO2: 17 mmol/L — ABNORMAL LOW (ref 22–32)
Calcium: 8.4 mg/dL — ABNORMAL LOW (ref 8.9–10.3)
Chloride: 91 mmol/L — ABNORMAL LOW (ref 98–111)
Creatinine, Ser: 1.62 mg/dL — ABNORMAL HIGH (ref 0.61–1.24)
GFR, Estimated: 53 mL/min — ABNORMAL LOW (ref >60)
Glucose, Bld: 110 mg/dL — ABNORMAL HIGH (ref 70–99)
Potassium: 3.6 mmol/L (ref 3.5–5.1)
Sodium: 115 mmol/L — CL (ref 135–145)
Total Bilirubin: 2 mg/dL — ABNORMAL HIGH (ref <1.2)
Total Protein: 4.6 g/dL — ABNORMAL LOW (ref 6.5–8.1)

## 2023-01-09 LAB — CBC WITH DIFFERENTIAL/PLATELET
Abs Immature Granulocytes: 0.03 10*3/uL (ref 0.00–0.07)
Basophils Absolute: 0 10*3/uL (ref 0.0–0.1)
Basophils Relative: 0 %
Eosinophils Absolute: 0.3 10*3/uL (ref 0.0–0.5)
Eosinophils Relative: 4 %
HCT: 19.4 % — ABNORMAL LOW (ref 39.0–52.0)
Hemoglobin: 7 g/dL — ABNORMAL LOW (ref 13.0–17.0)
Immature Granulocytes: 0 %
Lymphocytes Relative: 8 %
Lymphs Abs: 0.5 10*3/uL — ABNORMAL LOW (ref 0.7–4.0)
MCH: 35 pg — ABNORMAL HIGH (ref 26.0–34.0)
MCHC: 36.1 g/dL — ABNORMAL HIGH (ref 30.0–36.0)
MCV: 97 fL (ref 80.0–100.0)
Monocytes Absolute: 0.8 10*3/uL (ref 0.1–1.0)
Monocytes Relative: 12 %
Neutro Abs: 5.2 10*3/uL (ref 1.7–7.7)
Neutrophils Relative %: 76 %
Platelets: 48 10*3/uL — ABNORMAL LOW (ref 150–400)
RBC: 2 MIL/uL — ABNORMAL LOW (ref 4.22–5.81)
RDW: 13.8 % (ref 11.5–15.5)
WBC: 6.9 10*3/uL (ref 4.0–10.5)
nRBC: 0 % (ref 0.0–0.2)

## 2023-01-09 LAB — BRAIN NATRIURETIC PEPTIDE: B Natriuretic Peptide: 43.3 pg/mL (ref 0.0–100.0)

## 2023-01-09 LAB — PHOSPHORUS: Phosphorus: 3.4 mg/dL (ref 2.5–4.6)

## 2023-01-09 LAB — AMMONIA: Ammonia: 73 umol/L — ABNORMAL HIGH (ref 9–35)

## 2023-01-09 LAB — MAGNESIUM: Magnesium: 2.2 mg/dL (ref 1.7–2.4)

## 2023-01-09 MED ORDER — ACETAMINOPHEN 325 MG PO TABS
650.0000 mg | ORAL_TABLET | Freq: Four times a day (QID) | ORAL | Status: DC
  Administered 2023-01-09: 650 mg via ORAL
  Filled 2023-01-09: qty 2

## 2023-01-09 NOTE — TOC Transition Note (Signed)
Transition of Care [facility]) - Discharge Note   Patient Details  Name: Mathew [name] MRN: [id number] Date of Birth: 05/14/1980  [facility] CM/SW Contact:  Mearl Latin, LCSW Phone Number: 01/09/2023, 2:16 PM   Clinical Narrative:    Patient will DC to: [facility] Anticipated DC date: 01/09/23 Family notified: Daughter Transport by: [facility]   Per MD patient ready for DC to [facility]. RN to call report prior to discharge [phone]). RN, patient, patient's family, and facility notified of DC. Discharge Summary sent to facility. DC packet on chart including signed DNR. Ambulance transport requested for patient.   CSW will sign off for now as social work intervention is no longer needed. Please consult [place] again if new needs arise.     Final next level of care: Hospice Medical Facility Barriers to Discharge: Barriers Resolved   Patient Goals and CMS Choice Patient states their goals for this hospitalization and ongoing recovery are:: comfort CMS Medicare.gov Compare Post Acute Care list provided to:: Patient Represenative (must comment)        Discharge Placement                Patient to be transferred to facility by: [facility]   Patient and family notified of of transfer: 01/09/23  Discharge Plan and Services Additional resources added to the After Visit Summary for                                       Social Drivers of Health (SDOH) Interventions SDOH Screenings   Food Insecurity: Food Insecurity Present (01/05/2023)  Housing: Low Risk  (01/05/2023)  Recent Concern: Housing - Medium Risk (11/29/2022)  Transportation Needs: No Transportation Needs (01/05/2023)  Recent Concern: Transportation Needs - Unmet Transportation Needs (10/26/2022)  Utilities: Not At Risk (01/05/2023)  Recent Concern: Utilities - At Risk (11/29/2022)  Depression (PHQ2-9): High Risk (12/05/2022)  Financial Resource Strain: Medium  Risk (10/26/2022)  Physical Activity: Inactive (10/26/2022)  Social Connections: Unknown (10/26/2022)  Stress: No Stress Concern Present (10/26/2022)  Tobacco Use: High Risk (01/04/2023)  Health Literacy: Adequate Health Literacy (10/26/2022)     Readmission Risk Interventions    09/26/2022    2:53 PM 06/06/2022   11:59 AM  Readmission Risk Prevention Plan  Transportation Screening Complete Complete  PCP or Specialist Appt within 3-5 Days  Complete  HRI or Home Care Consult  Complete  Social Work Consult for Recovery Care Planning/Counseling  Complete  Palliative Care Screening  Complete  Medication Review [employer]) Referral to Pharmacy Complete  PCP or Specialist appointment within 3-5 days of discharge Complete   HRI or Home Care Consult Complete   SW Recovery Care/Counseling Consult Complete   Palliative Care Screening Patient Refused   Skilled Nursing Facility Not Applicable

## 2023-01-09 NOTE — Discharge Summary (Addendum)
Physician Discharge Summary   Patient: Mathew [name] MRN: [id number] DOB: 1980/01/21  Admit date:     01/04/2023  Discharge date: 01/09/23  Discharge Physician: Tyrone Nine   PCP: [name], MD   Recommendations at discharge:  Comfort care at residential hospice  Discharge Diagnoses: Principal Problem:   Acute on chronic hyponatremia Active Problems:   Alcoholic cirrhosis (HCC)   Alcohol use disorder   End stage liver disease (HCC)   Chronic anemia   Refractory ascites   Chronic idiopathic thrombocytopenia [facility])  Hospital Course: 42 y.o. 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     Last drink yesterday 12/24. Patient reported generalized abdominal pain which is chronic in nature.  However he is endorsing generalized weakness.   Patient is currently on hospice care stated that on daily basis he has almost 1 L of output through the abdominal drain. Currently patient is on hospice care for end-stage liver disease and hospice nurse coming to patient so managing and draining the abdominal Pleurx catheter.  Patient is also DNR and DNI based on palliative discussion during last hospital admission.  He was admitted and had > 13L drained from peritoneal cavity. He was covered for SBP empirically though he was afebrile with normal WBC. After day 3 of antibiotics, he had fever. Blood culture collected, though no growth noted. Midodrine was given for BP support, diuretics  held. He has suffered acute renal failure with worsening hyponatremia, and no po intake. Mental status worsening. Palliative care has continued following and based on discussions with the family, given that the patient's clinical status appears to be worsening despite maximal medical therapy, we will discharge to residential hospice.   Assessment and Plan: Patient is home hospice.  All treatment currently is palliative, he is DNR.   - Patient not taking any po. He requires residential hospice management at this time. We will plan transfer today based on discussions with family.     End-stage liver disease caused by alcoholic cirrhosis, ongoing alcohol abuse with refractory/recurrent ascites, now developing hepatorenal syndrome: Here with massive ascites and abdominal pain, has indwelling abdominal Pleurx catheter, patient is terminal and with home hospice at home, counseled strictly to abstain from alcohol, 8 L of peritoneal fluid removed via his Pleurx catheter on 01/05/2023, 7 L on 01/06/2023, along with midodrine, IV fluids and albumin.  - Given his decompensation when fluid removed, would avoid large volume paracentesis. If needed for comfort, remove less volume at a time.    - Continue CTX for SBP empirically. Pt does have a fever, though WBC remains stable/normal. Blood cultures drawn 12/29 have yet to grow anything. No  - Continue lactulose, rifaximin. Note ammonia level is rising regardless. Would consider not continuing this at hospice.    Hyponatremia, hyperkalemia, AKI. Na worsening down to 115, Cr up 0.8 > 1.6. K, bicarb are stable. Consistent with hepatorenal syndrome. Prognosis extremely poor. - Hold all diuretics for now, fluid removal via abdominal Pleurx catheter,     Thrombocytopenia and anemia of chronic disease, lymphopenia.  Due to #1 above.  Supportive care. Avoid anticoagulation since platelets are <  50k and we are transitioning to residential hospice. Hgb on 12/30 is 7g/dl. Do  not believe transfusion would improve his comfort at this time.   Consultants: Palliative care Procedures performed: Pleurx peritoneal fluid removal  Disposition: Residential hospice Diet recommendation: As tolerated DISCHARGE MEDICATION: Allergies as of 01/09/2023   No Known Allergies      Medication List     STOP taking these medications    furosemide 40 MG tablet Commonly known as: LASIX   lactulose 10 GM/15ML solution Commonly known as: CHRONULAC   spironolactone 100 MG tablet Commonly known as: ALDACTONE       TAKE these medications    fentaNYL 12 MCG/HR Commonly known as: DURAGESIC Place 1 patch onto the skin every 3 (three) days.   HYDROmorphone HCl 1 MG/ML Liqd Commonly known as: DILAUDID Take 1 mL (1 mg total) by mouth every 3 (three) hours as needed for severe pain (pain score 7-10).   HYDROmorphone HCl 1 MG/ML Liqd Commonly known as: DILAUDID Take 1 mL (1 mg total) by mouth every 3 (three) hours as needed.   HYDROmorphone 2 MG tablet Commonly known as: DILAUDID Take 2 mg by mouth every 3 (three) hours as needed.   traZODone 50 MG tablet Commonly known as: DESYREL Take 50 mg by mouth daily as needed.        Discharge Exam: Filed Weights   01/07/23 0322 01/08/23 0308 01/09/23 0412  Weight: 77.1 kg 68.7 kg 71.3 kg  BP (!) 110/53 (BP Location: Left Arm)   Pulse (!) 112   Temp (!) 100.6 F (38.1 C) (Oral)   Resp 20   Ht 5\' 5"  (1.651 m)   Wt 71.3 kg   SpO2 100%   BMI 26.16 kg/m   Drowsy but sitting with breakfast in front of him. He says he feels ok, no abd pain, no chest pain or shortness of breath or cough or pain with urination.   Belly with fluid wave but soft, no grimace elicited with palpation.  Nonlabored breathing room air, regular borderline tachycardia noted.  Condition at discharge: Stable to transfer to residential hospice at the time of this dictation, though overall condition is extremely poor  The results of significant  diagnostics from this hospitalization (including imaging, microbiology, ancillary and laboratory) are listed below for reference.   Imaging Studies: VAS [place] LOWER EXTREMITY VENOUS (DVT) Result Date: 01/05/2023  Lower Venous DVT Study Patient Name:  GAREK DICKER [facility]  Date of Exam:   01/05/2023 Medical Rec #: [id number]                  Accession #:    [id number] Date of Birth: Feb 19, 1980                  Patient Gender: M Patient Age:   42 years Exam Location:  [facility] Procedure:      VAS [place] LOWER EXTREMITY VENOUS (DVT) Referring Phys: Tereasa Coop --------------------------------------------------------------------------------  Indications: Swelling, and Edema.  Comparison Study: No prior exam. Performing Technologist: Fernande Bras  Examination Guidelines: A complete evaluation includes B-mode imaging, spectral Doppler, color Doppler, and power Doppler as needed of all accessible portions of each vessel. Bilateral testing is considered an integral part of a complete examination. Limited examinations for reoccurring indications may be performed as noted. The reflux portion of the exam is performed with the patient in reverse Trendelenburg.  +---------+---------------+---------+-----------+----------+--------------+ RIGHT    CompressibilityPhasicitySpontaneityPropertiesThrombus Aging +---------+---------------+---------+-----------+----------+--------------+ CFV      Full  Yes      Yes                                 +---------+---------------+---------+-----------+----------+--------------+ SFJ      Full           Yes      Yes                                 +---------+---------------+---------+-----------+----------+--------------+ FV Prox  Full                                                        +---------+---------------+---------+-----------+----------+--------------+ FV Mid   Full                                                         +---------+---------------+---------+-----------+----------+--------------+ FV DistalFull                                                        +---------+---------------+---------+-----------+----------+--------------+ PFV      Full                                                        +---------+---------------+---------+-----------+----------+--------------+ POP      Full           Yes      Yes                                 +---------+---------------+---------+-----------+----------+--------------+ PTV      Full                                                        +---------+---------------+---------+-----------+----------+--------------+ PERO     Full                                                        +---------+---------------+---------+-----------+----------+--------------+   +---------+---------------+---------+-----------+----------+--------------+ LEFT     CompressibilityPhasicitySpontaneityPropertiesThrombus Aging +---------+---------------+---------+-----------+----------+--------------+ CFV      Full           Yes      Yes                                 +---------+---------------+---------+-----------+----------+--------------+ SFJ      Full  Yes      Yes                                 +---------+---------------+---------+-----------+----------+--------------+ FV Prox  Full                                                        +---------+---------------+---------+-----------+----------+--------------+ FV Mid   Full                                                        +---------+---------------+---------+-----------+----------+--------------+ FV DistalFull                                                        +---------+---------------+---------+-----------+----------+--------------+ PFV      Full                                                         +---------+---------------+---------+-----------+----------+--------------+ POP      Full           Yes      Yes                                 +---------+---------------+---------+-----------+----------+--------------+ PTV      Full                                                        +---------+---------------+---------+-----------+----------+--------------+ PERO     Full                                                        +---------+---------------+---------+-----------+----------+--------------+     Summary: BILATERAL: - No evidence of deep vein thrombosis seen in the lower extremities, bilaterally. -No evidence of popliteal cyst, bilaterally.   *See table(s) above for measurements and observations. Electronically signed by Coral Else MD on 01/05/2023 at 8:29:35 PM.    Final    IR Radiologist Eval & Mgmt Result Date: 01/05/2023 INDICATION: Patient with history of end-stage cirrhosis, refractory ascites s/p tunneled peritoneal catheter placement 12/30/22 in IR. Patient admitted with abdominal pain and concern for malfunctioning Pleurx. EXAM: Pleurx catheter assessment TECHNIQUE: None CONTRAST:  None MEDICATIONS: None ANESTHESIA/SEDATION: None RADIOPHARMACEUTICALS:  None FLUOROSCOPY TIME:  None COMPARISON:  None FINDINGS: Pleurx insertion site clean, dry without erythema or edema. Retention suture is in tact. Pleurx accessed using sterile technique with immediate return of serosanguineous  fluid. The catheter was attached to a Vacutainer and 8.0 L was removed. Sterile cap and dressing applied. PROCEDURE: See above. COMPLICATIONS: None immediate. IMPRESSION: Pleurx catheter accessed without issue. 8.0 L of serosanguineous fluid was removed which was the requested limit by the GI team. Performed by Lynnette Caffey, PA-C Electronically Signed   By: Irish Lack M.D.   On: 01/05/2023 14:12   IR IMAGE GUIDED DRAINAGE PERCUT CATH  PERITONEAL RETROPERIT Result Date:  12/30/2022 INDICATION: 42 year old male with end-stage cirrhosis and refractory large volume ascites. He is not a candidate for tips and is going to hospice. Therefore, he presents for placement of a tunneled peritoneal drainage catheter. EXAM: Tunneled peritoneal drainage catheter. MEDICATIONS: None. ANESTHESIA/SEDATION: Fentanyl 75 mcg IV; Versed 3 mg IV administered intravenously by radiology nursing. Moderate Sedation Time:  12 minutes The patient's vital signs and level of consciousness were continuously monitored during the procedure by the interventional radiology nurse under my direct supervision. COMPLICATIONS: None immediate. PROCEDURE: Informed written consent was obtained from the patient after a thorough discussion of the procedural risks, benefits and alternatives. All questions were addressed. Maximal Sterile Barrier Technique was utilized including caps, mask, sterile gowns, sterile gloves, sterile drape, hand hygiene and skin antiseptic. A timeout was performed prior to the initiation of the procedure. The right lower quadrant was interrogated with ultrasound. There is a large pocket of fluid. The skin was sterilely prepped and draped in the standard fashion using chlorhexidine skin prep. Local anesthesia was attained by infiltration with 1% lidocaine. A small dermatotomy was made. An 18 gauge sheath needle was carefully advanced through the abdominal wall and into the fluid collection. The needle portion was removed and the sheath left in place. A 0.035 wire was advanced through the sheath and into pelvis. A suitable skin exit site was selected approximately 5 cm from the peritoneal entry site. Local anesthesia was again attained by infiltration with 1% lidocaine. A second dermatotomy was made. The tunneled catheter was then tunneled from the skin exit site to the dermatotomy overlying the peritoneal access site. The access sheath was then removed over the wire. The peel-away sheath was inserted  into the peritoneal cavity. The tunneled catheter was advanced through the peel-away sheath and the peel-away sheath was discarded. The dermatotomy over the peritoneal access site was closed with 3 0 Vicryl suture and the epidermis sealed with Dermabond. The catheter was secured at the skin exit site with 0 Prolene suture. IMPRESSION: Successful placement of a tunneled peritoneal drainage catheter via the right lower quadrant. Electronically Signed   By: Malachy Moan M.D.   On: 12/30/2022 16:31   IR Paracentesis Result Date: 12/28/2022 INDICATION: Patient with a history of cirrhosis with recurrent ascites. Interventional radiology asked to perform a diagnostic and therapeutic paracentesis. EXAM: ULTRASOUND GUIDED PARACENTESIS MEDICATIONS: 1% lidocaine 15 mL COMPLICATIONS: None immediate. PROCEDURE: Informed written consent was obtained from the patient after a discussion of the risks, benefits and alternatives to treatment. A timeout was performed prior to the initiation of the procedure. Initial ultrasound scanning demonstrates a large amount of ascites within the right lower abdominal quadrant. The right lower abdomen was prepped and draped in the usual sterile fashion. 1% lidocaine was used for local anesthesia. Following this, a 19 gauge, 7-cm, Yueh catheter was introduced. An ultrasound image was saved for documentation purposes. The paracentesis was performed. The catheter was removed and a dressing was applied. The patient tolerated the procedure well without immediate post procedural complication. Patient received post-procedure intravenous  albumin; see nursing notes for details. FINDINGS: A total of approximately 5.6 L of blood-tinged fluid was removed. Samples were sent to the laboratory as requested by the clinical team. IMPRESSION: Successful ultrasound-guided paracentesis yielding 5.6 liters of peritoneal fluid. Procedure performed by Alwyn Ren NP PLAN: The patient has previously been  formally evaluated by the [facility] Interventional Radiology Portal [facility]pert[facility] and is being actively followed for potential future intervention. Electronically Signed   By: Marliss Coots M.D.   On: 12/28/2022 07:43   CT ABDOMEN PELVIS W CONTRAST Result Date: 12/26/2022 CLINICAL DATA:  Acute abdominal pain.  Cirrhosis. EXAM: CT ABDOMEN AND PELVIS WITH CONTRAST TECHNIQUE: Multidetector CT imaging of the abdomen and pelvis was performed using the standard protocol following bolus administration of intravenous contrast. RADIATION DOSE REDUCTION: This exam was performed according to the departmental dose-optimization program which includes automated exposure control, adjustment of the mA and/or kV according to patient size and/or use of iterative reconstruction technique. CONTRAST:  75mL OMNIPAQUE IOHEXOL 350 MG/ML SOLN COMPARISON:  CT abdomen and pelvis 09/26/2022 FINDINGS: Lower chest: There is atelectasis in the lung bases. Hepatobiliary: The liver is small in size with nodular liver contour compatible with cirrhosis. Gallstones are seen. There is no biliary ductal dilatation. Pancreas: Unremarkable. No pancreatic ductal dilatation or surrounding inflammatory changes. Spleen: Mildly enlarged, unchanged. Adrenals/Urinary Tract: Adrenal glands are unremarkable. Kidneys are normal, without renal calculi, focal lesion, or hydronephrosis. Bladder is unremarkable. Stomach/Bowel: Stomach is within normal limits. Appendix appears normal. No evidence of bowel wall thickening, distention, or inflammatory changes. Appendix is not seen. Vascular/Lymphatic: Aorta and IVC are normal in size. Portal vein appears patent. Varices are seen in the right abdomen. No enlarged lymph nodes are identified. Reproductive: Prostate is unremarkable. Scrotal wall edema versus small hydroceles. Other: There is a very large amount of ascites throughout the abdomen and pelvis which has increased from prior. There is an umbilical hernia  containing ascites which has increased in size. There is diffuse body wall edema. There is a small amount of layering hyperdensity in the right paracolic gutter image 3/58 which was not seen on the prior exam. No free intraperitoneal air. Musculoskeletal: No acute or significant osseous findings. IMPRESSION: 1. Very large amount of ascites throughout the abdomen and pelvis which has increased from prior. 2. Small amount of layering hyperdensity in the right paracolic gutter which was not seen on the prior exam. Findings may represent small amount of hemorrhage. 3. 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 4. Cholelithiasis. 5. Diffuse body wall edema. 6. Umbilical hernia containing ascites has increased in size. Electronically Signed   By: Darliss Cheney M.D.   On: 12/26/2022 20:43   IR Paracentesis Result Date: 12/21/2022 INDICATION: Alcoholic cirrhosis with recurrent ascites EXAM: ULTRASOUND GUIDED LEFT SIDED PARACENTESIS MEDICATIONS: 10 mL 1% lidocaine COMPLICATIONS: None immediate. PROCEDURE: Informed written consent was obtained from the patient after a discussion of the risks, benefits and alternatives to treatment. A timeout was performed prior to the initiation of the procedure. Initial ultrasound scanning demonstrates a large amount of ascites within the left lower abdominal quadrant. The left lower abdomen was prepped and draped in the usual sterile fashion. 1% lidocaine was used for local anesthesia. Following this, a 19 gauge, 7-cm, Yueh catheter was introduced. An ultrasound image was saved for documentation purposes. The paracentesis was performed. The catheter was removed and a dressing was applied. The patient tolerated the procedure well without immediate post procedural complication. Patient received  post-procedure intravenous albumin; see nursing notes for details. FINDINGS: A total of approximately 5 L of clear, yellow  fluid was removed.  IMPRESSION: Successful ultrasound-guided paracentesis yielding 5L liters of peritoneal fluid. PLAN: The patient has previously been formally evaluated by the [facility] Interventional Radiology Portal Hypert[facility] and is being actively followed for potential future intervention. Procedure performed by Philipp Ovens PA-C Electronically Signed   By: Simonne Come M.D.   On: 12/21/2022 16:30    Microbiology: Results for orders placed or performed during the hospital encounter of 12/26/22  Body fluid culture w Gram Stain     Status: None   Collection Time: 12/27/22  3:22 PM   Specimen: Abdomen; Peritoneal Fluid  Result Value Ref Range Status   Specimen Description PERITONEAL  Final   Special Requests NONE  Final   Gram Stain NO WBC SEEN NO ORGANISMS SEEN   Final   Culture   Final    NO GROWTH 3 DAYS Performed at [facility], [address] [place], [place] [address]    Report Status 12/30/2022 FINAL  Final    Labs: CBC: Recent Labs  Lab 01/04/23 2217 01/05/23 0511 01/07/23 0334 01/08/23 0333 01/09/23 0506  WBC 4.4 4.2 5.3 6.6 6.9  NEUTROABS 3.2  --  4.0 5.8 5.2  HGB 8.3* 8.7* 7.9* 8.0* 7.0*  HCT 23.9* 24.2* 22.8* 22.4* 19.4*  MCV 99.6 98.8 99.6 98.7 97.0  PLT 67* 64* 50* 47* 48*   Basic Metabolic Panel: Recent Labs  Lab 01/05/23 0511 01/06/23 0835 01/07/23 0334 01/08/23 0333 01/09/23 0506  NA 116* 118* 121* 122* 115*  K 5.4* 4.6 4.1 3.9 3.6  CL 92* 93* 97* 94* 91*  CO2 18* 20* 18* 18* 17*  GLUCOSE 117* 117* 128* 142* 110*  BUN 31* 29* 31* 36* 40*  CREATININE 0.83 0.84 0.82 1.05 1.62*  CALCIUM 8.0* 8.5* 9.1 9.5 8.4*  MG  --  2.1 2.4 2.6* 2.2  PHOS  --  3.8 4.3 4.7* 3.4   Liver Function Tests: Recent Labs  Lab 01/05/23 0511 01/06/23 0835 01/07/23 0334 01/08/23 0333 01/09/23 0506  AST 43* 27 22 20 24   ALT 24 14 12 11 11   ALKPHOS 135* 72 56 40 36*  BILITOT 3.4* 3.0* 3.7* 3.5* 2.0*  PROT 5.3* 5.5* 5.1* 5.8* 4.6*  ALBUMIN 2.4* 3.9 4.0 4.8  3.5   CBG: Recent Labs  Lab 01/05/23 0836 01/05/23 1142 01/05/23 1719 01/06/23 0845  GLUCAP 118* 114* 108* 134*    Discharge time spent: greater than 30 minutes.  Signed: Tyrone Nine, MD [facility] 01/09/2023

## 2023-01-09 NOTE — Plan of Care (Signed)
  Problem: Education: Goal: Knowledge of General Education information will improve Description: Including pain rating scale, medication(s)/side effects and non-pharmacologic comfort measures Outcome: Progressing   Problem: Clinical Measurements: Goal: Ability to maintain clinical measurements within normal limits will improve Outcome: Progressing Goal: Respiratory complications will improve Outcome: Progressing   Problem: Activity: Goal: Risk for activity intolerance will decrease Outcome: Progressing   Problem: Coping: Goal: Level of anxiety will decrease Outcome: Progressing   

## 2023-01-10 ENCOUNTER — Other Ambulatory Visit: Payer: Self-pay

## 2023-01-12 ENCOUNTER — Ambulatory Visit: Payer: Self-pay | Admitting: Licensed Clinical Social Worker

## 2023-01-12 NOTE — Patient Outreach (Addendum)
  Care Coordination   01/12/2023 Name: Mathew [name] MRN: [id number] DOB: 22-Jul-1980   Care Coordination Outreach Attempts:  An unsuccessful outreach was attempted for an appointment today.  Follow Up Plan:  Additional outreach attempts will be made to offer the patient complex care management information and services.   Encounter Outcome:  No Answer   Care Coordination Interventions:  No, not indicated  Upon chart review the patient has been admitted to Hospice as of 01/01/2024  Tobias CHARM Maranda HEDWIG, PhD [facility], [facility] Social Worker Direct Dial: [phone]  Fax: [phone]

## 2023-01-12 NOTE — Patient Instructions (Signed)
 Visit Information  Thank you for taking time to visit with me today. Please don't hesitate to contact me if I can be of assistance to you.   Following are the goals we discussed today:   Goals Addressed   None     Our next appointment is by telephone on 01/26/2023 at 1:45 pm  Please call the care guide team at [phone] if you need to cancel or reschedule your appointment.   If you are experiencing a Mental Health or Behavioral Health Crisis or need someone to talk to, please call the Suicide and Crisis Lifeline: 988 go to [facility] Urgent [facility] [address] [place] [phone]) call 911  Patient verbalizes understanding of instructions and care plan provided today and agrees to view in MyChart. Active MyChart status and patient understanding of how to access instructions and care plan via MyChart confirmed with patient.     Tobias CHARM Maranda HEDWIG, PhD [facility], [facility] Social Worker Direct Dial: [phone]  Fax: [phone]

## 2023-01-13 LAB — CULTURE, BLOOD (ROUTINE X 2)
Culture: NO GROWTH
Culture: NO GROWTH

## 2023-01-16 ENCOUNTER — Ambulatory Visit: Payer: Self-pay

## 2023-01-16 NOTE — Patient Outreach (Signed)
  Care Coordination   Follow Up Visit Note   01/16/2023 Name: Mathew [name] MRN: [id number] DOB: Nov 11, 1980  Mathew [name] is a 43 y.o. year old male who sees Brien Belvie BRAVO, MD for primary care. I closed patient from [facility] nurse care coordination services.   What matters to the patients health and wellness today?  N/A    Goals Addressed             This Visit's Progress    COMPLETED: RN Care Coordination Activities: further follow up is needed       Care Coordination Interventions: Received patient status update from Tobias Moose BSW advising patient is currently under the care of [facility]osp[facility] Closed patient from [facility] RN Care Coordination Services     Interventions Today    Flowsheet Row Most Recent Value  General Interventions   General Interventions Discussed/Reviewed Communication with  Communication with Social Work  Jeb Moose BSW]  Advanced Directive Interventions   Advanced Directives Discussed/Reviewed [facility]  [facility]          SDOH assessments and interventions completed:  No     Care Coordination Interventions:  Yes, provided   Follow up plan: No further intervention required.   Encounter Outcome:  Patient Visit Completed

## 2023-01-17 ENCOUNTER — Other Ambulatory Visit: Payer: Self-pay

## 2023-01-17 ENCOUNTER — Ambulatory Visit: Payer: Self-pay | Admitting: Gastroenterology

## 2023-01-17 NOTE — Progress Notes (Deleted)
 HPI : seen in November 43 year old male here for follow-up visit for cirrhosis /alcoholic liver disease /alcohol abuse.  See my last office note dated November 8 for full details of this case.   Recall he has a history of decompensated alcoholic cirrhosis.  He has had a very complicated course with multiple hospitalizations this year.  I saw him in May when he was admitted with SBP.  He has problems with recurrent ascites leading to repeated paracentesis.  He has esophageal varices, documented on prior EGD few years ago, as well as concern for hepatic encephalopathy in the past. Recall he was hospitalized in September for E. coli bacteremia.  Recall he has had resistant E. coli when hospitalized for prior SBP for which no oral antibiotics would cover.  His prophylaxis has been to prophylax against the more common causes of SBP.  He was referred to [facility] hepatology back in May but he never made it to that appointment.   I saw him on November 8.  At that time he was actively drinking alcohol.  Main symptoms that bother him is ascites along with periumbilical hernia.  He is also jaundiced, has known varices but they have not bled yet.  Spent a significant amount of time talking with him about alcohol use, and recommendations for his medications at the last visit. RN Slater Diesel was incredibly helpful since our last visit to get him his medications, he could not afford them and [facility] is covering his medications.  He is due for an EGD, I recommended he be referred to a hepatologist, he does not have insurance and has not been possible.  He has applied for insurance and waiting to hear back since the last visit.   He has been seen in the ED last week for worsening ascites since of last seen him.  He was placed on Lasix  20 mg and Aldactone  50 mg daily since the last visit he endorses compliance with that.  His sodium has unfortunately dropped from high 120s to 121 since starting this.  I recommended a  low-sodium diet for him, unclear if he is compliant with that or not.  He has continued to drink alcohol, states he last had a drink a week ago prior to presenting to the ED for worsening ascites.  He had a paracentesis and they removed 11 L of fluid.  He is on midodrine  for hypotension and to hopefully better tolerate diuretics.  We stopped PPI given his history of SBP.  He is on Bactrim  for SBP prophylaxis.  We have not been able to place him on beta-blocker given the worsening ascites.   He is jaundiced today in the office.  His abdomen he states has reaccumulated fluid and has another paracentesis pending for tomorrow that was set up by Dr. Newlin.  He was unclear about dates and times of his appointments.  He is on lactulose  and states he is tolerating it.   Last set of imaging with a CT abdomen pelvis with contrast in September which showed no hepatomas.   Previous GI workup EGD 03/2018 inpatient for IDA with Dr. Donnald: Grade 2 esophageal varices, 2 cm hiatal hernia, portal hypertensive gastropathy, friable gastric mucosa, normal duodenum.   Colonoscopy 03/2018 for IDA: Friable colonic mucosa, no specimens collected    CT abdomen / pelvis 09/26/22 -  IMPRESSION: 1. Cirrhosis with large volume simple ascites. 2. No bowel obstruction. The umbilical hernia contains ascitic fluid. 3. Cholelithiasis. 4. Small left pleural effusion.  43 y.o. male here for assessment of the following   1. Alcoholic cirrhosis of liver with ascites (HCC)   2. Alcohol use disorder   3. Hyponatremia     Lengthy discussion with the patient through translator today.   I have counseled him at length on the severity of his liver disease, he absolutely must abstain from alcohol but he has not done so to date.  I question how well he understands the severity of his liver disease and situation at this time despite being quite frank about risk for mortality and progressive worsening of his liver disease with  alcohol use. He is quite decompensated, high risk for mortality.   He absolutely must abstain from alcohol, I discussed this with him at length.  He seems to understand and states he will stop.  Denies withdrawal symptoms, states he last had a drink a week ago, will see how he does with time with this.  He does need assistance with this and encouraged use of free resources available to him, he does not have insurance and not sure his ability to get in with behavioral health, will speak with his case manager.   He is compliant with his diuretics, since making that change his sodium has progressively decreased and I am concerned that is causing it with his continued alcohol use.  Unfortunately I need to stop his diuretics right now given worsening hyponatremia and repeat his labs in a few days.  He will continue his antibiotic, lactulose , midodrine .  Again not a candidate for beta-blocker due to his progressive ascites.  I think he is going to need serial paracentesis, scheduled tomorrow with IR, we will book him another 1 every 2 weeks.  His MELD is rather high for a TIPS procedure, do not think he is a great candidate for that.   He needs an EGD, I have tried to refer him to hepatology but he does not have insurance.  This is a major barrier to his care.  He has completed the paperwork for insurance but has no idea how long that will take to come back, we will touch base with the social worker about this.   He is very high risk for recurrent hospitalization and progressive deterioration.  If he is not a candidate for transplant and continues to drink alcohol, will need to consider palliative care consultation.   I will reach out to his primary care team and social support at [facility] to update them about my concerns.   PLAN: - paracentesis tomorrow - will have him scheduled once every 2 weeks with IR for large volume paracentesis with albumin  infusion. Cell count with diff when drawn - must stop all alcohol  - encourage AA or other support groups - low Na diet - stop lasix  / aldactone  due to worsening hyponatremia since starting this - repeat lab Monday - BMET - continue bactrim , continue lactulose , continue midocrine - needs EGD, needs hepatology consultation - cannot do without insurance, poor candidate for TIPS - awaiting insurance application - working on this with case manager - f/u one month     Admitted 12/16/ to 12/21 -  History of present illness:  Hendrik Donath [name] is a 43 y.o. male with a history of decompensated alcoholic cirrhosis with ongoing alcohol use, recurrent ascites, hx SBP, hx esophageal varices, and multiple hospitalizations for complications who presented to the ED on 12/26/2022 with abdominal pain found to have recurrent ascites since last paracentesis on 12/11.  Pallaitve care  consulted, now DNR [facility] Course:  Patient was hospitalized12/16 with worsening abdominal distention and rapid reaccumulation He was seen by multidisciplinary team including palliative care and ultimately decision was made to progress to hospice level care Paracentesis done earlier in hospital stay was not suggestive of SBP and no antibiotics were given His hyponatremia was from hypovolemia and no further workup was planned He had coagulopathy as well and was transfused 1 unit of blood during hospital stay no labs were followed I discontinued his midodrine  at discharge He had a Pleurx placed and 5 L were drained ultimately on 12/20 We have prescribed him diuretics at discharge although he may not need to take them given he has a Pleurx already   Readmitted 12/25/ to 12/30: 43 y.o. 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     Last drink yesterday 12/24. Patient reported generalized abdominal pain which is chronic in nature.  However he is endorsing generalized weakness.   Patient is currently on hospice care stated that on daily basis he has almost 1 L of output through the abdominal drain. Currently patient is on hospice care for end-stage liver disease and hospice nurse coming to patient so managing and draining the abdominal Pleurx catheter.  Patient is also DNR and DNI based on palliative discussion during last hospital admission.   He was admitted and had > 13L drained from peritoneal cavity. He was covered for SBP empirically though he was afebrile with normal WBC. After day 3 of antibiotics, he had fever. Blood culture collected, though no growth noted. Midodrine  was given for BP support, diuretics held. He has suffered acute renal failure with worsening hyponatremia, and no po intake. Mental status worsening. Palliative care has continued following and based on discussions with the family, given that the patient's clinical status appears to be worsening despite maximal medical therapy, we will discharge to residential hospice.    Assessment and Plan: Patient is home hospice.  All treatment currently is palliative, he is DNR.   - Patient not taking any po. He requires residential hospice management at this time. We will plan transfer today based on discussions with family.     End-stage liver disease caused by alcoholic cirrhosis, ongoing alcohol abuse with refractory/recurrent ascites, now developing hepatorenal syndrome: Here with massive ascites and abdominal pain, has indwelling abdominal Pleurx catheter, patient is terminal and with home  hospice at home, counseled strictly to abstain from alcohol, 8 L of peritoneal fluid removed via his Pleurx catheter on 01/05/2023, 7 L on 01/06/2023, along with midodrine , IV fluids and albumin .  - Given his decompensation when fluid removed, would avoid large volume paracentesis. If needed for comfort, remove less volume at a time.    - Continue CTX for SBP empirically. Pt does have a fever, though WBC remains stable/normal. Blood cultures drawn 12/29 have yet to grow anything. No  - Continue lactulose , rifaximin . Note ammonia level is rising regardless. Would consider not continuing this at hospice.    Hyponatremia, hyperkalemia, AKI. Na worsening down to 115, Cr up 0.8 > 1.6. K, bicarb are stable. Consistent with  hepatorenal syndrome. Prognosis extremely poor. - Hold all diuretics for now, fluid removal via abdominal Pleurx catheter,     Thrombocytopenia and anemia of chronic disease, lymphopenia.  Due to #1 above.  Supportive care. Avoid anticoagulation since platelets are < 50k and we are transitioning to residential hospice. Hgb on 12/30 is 7g/dl. Do not believe transfusion would improve his comfort at this time.    Consultants: Palliative care Procedures performed: Pleurx peritoneal fluid removal  Disposition: Residential hospice Diet recommendation: As tolerated        Past Medical History:  Diagnosis Date   Acute metabolic encephalopathy 06/20/2021   Alcohol withdrawal seizure (HCC)    Cirrhosis (HCC)    ETOH abuse    Pneumonia 04/02/2020   SBP (spontaneous bacterial peritonitis) (HCC) 06/17/2021     Past Surgical History:  Procedure Laterality Date   COLONOSCOPY WITH PROPOFOL  N/A 03/17/2018   Procedure: COLONOSCOPY WITH PROPOFOL ;  Surgeon: Donnald Charleston, MD;  Location: [facility] ENDOSCOPY;  Service: Endoscopy;  Laterality: N/A;   ESOPHAGOGASTRODUODENOSCOPY (EGD) WITH PROPOFOL  N/A 03/17/2018   Procedure: ESOPHAGOGASTRODUODENOSCOPY (EGD) WITH PROPOFOL ;  Surgeon: Donnald Charleston,  MD;  Location: [facility] ENDOSCOPY;  Service: Endoscopy;  Laterality: N/A;   IR IMAGE GUIDED DRAINAGE PERCUT CATH  PERITONEAL RETROPERIT  12/30/2022   IR PARACENTESIS  03/14/2018   IR PARACENTESIS  11/18/2020   IR PARACENTESIS  06/17/2021   IR PARACENTESIS  08/30/2021   IR PARACENTESIS  09/02/2021   IR PARACENTESIS  11/16/2021   IR PARACENTESIS  03/01/2022   IR PARACENTESIS  05/31/2022   IR PARACENTESIS  06/02/2022   IR PARACENTESIS  06/23/2022   IR PARACENTESIS  07/01/2022   IR PARACENTESIS  08/18/2022   IR PARACENTESIS  09/26/2022   IR PARACENTESIS  11/07/2022   IR PARACENTESIS  12/07/2022   IR PARACENTESIS  12/21/2022   IR PARACENTESIS  12/27/2022   IR RADIOLOGIST EVAL & MGMT  01/05/2023   Family History  Adopted: Yes  Problem Relation Age of Onset   Liver disease Neg Hx    Esophageal cancer Neg Hx    Colon cancer Neg Hx    Social History   Tobacco Use   Smoking status: Every Day    Current packs/day: 0.25    Average packs/day: 0.3 packs/day for 3.4 years (0.9 ttl pk-yrs)    Types: Cigarettes    Start date: 08/11/2019   Smokeless tobacco: Never  Vaping Use   Vaping status: Never Used  Substance Use Topics   Alcohol use: Yes    Alcohol/week: 2.0 standard drinks of alcohol    Types: 2 Cans of beer per week    Comment: 3-6 beer cans a week   Drug use: No   Current Outpatient Medications  Medication Sig Dispense Refill   fentaNYL  (DURAGESIC ) 12 MCG/HR Place 1 patch onto the skin every 3 (three) days. 5 patch 0   HYDROmorphone  (DILAUDID ) 2 MG tablet Take 2 mg by mouth every 3 (three) hours as needed.     HYDROmorphone  HCl (DILAUDID ) 1 MG/ML LIQD Take 1 mL (1 mg total) by mouth every 3 (three) hours as needed for severe pain (pain score 7-10). 56 mL 0   HYDROmorphone  HCl (DILAUDID ) 1 MG/ML LIQD Take 1 mL (1 mg total) by mouth every 3 (three) hours as needed. 56 mL 0   traZODone  (DESYREL ) 50 MG tablet Take 50 mg by mouth daily as needed.     No current facility-administered medications for  this visit.   No Known Allergies  Review of Systems: All systems reviewed and negative except where noted in HPI.    VAS US  LOWER EXTREMITY VENOUS (DVT) Result Date: 01/05/2023  Lower Venous DVT Study Patient Name:  EARLAND REISH [facility]  Date of Exam:   01/05/2023 Medical Rec #: [id number]                  Accession #:    [id number] Date of Birth: 12/16/1980                  Patient Gender: M Patient Age:   89 years Exam Location:  [facility] Procedure:      VAS US  LOWER EXTREMITY VENOUS (DVT) Referring Phys: MICAELA SUNDIL --------------------------------------------------------------------------------  Indications: Swelling, and Edema.  Comparison Study: No prior exam. Performing Technologist: Edilia Elden Appl  Examination Guidelines: A complete evaluation includes B-mode imaging, spectral Doppler, color Doppler, and power Doppler as needed of all accessible portions of each vessel. Bilateral testing is considered an integral part of a complete examination. Limited examinations for reoccurring indications may be performed as noted. The reflux portion of the exam is performed with the patient in reverse Trendelenburg.  +---------+---------------+---------+-----------+----------+--------------+ RIGHT    CompressibilityPhasicitySpontaneityPropertiesThrombus Aging +---------+---------------+---------+-----------+----------+--------------+ CFV      Full           Yes      Yes                                 +---------+---------------+---------+-----------+----------+--------------+ SFJ      Full           Yes      Yes                                 +---------+---------------+---------+-----------+----------+--------------+ FV Prox  Full                                                        +---------+---------------+---------+-----------+----------+--------------+ FV Mid   Full                                                         +---------+---------------+---------+-----------+----------+--------------+ FV DistalFull                                                        +---------+---------------+---------+-----------+----------+--------------+ PFV      Full                                                        +---------+---------------+---------+-----------+----------+--------------+ POP      Full           Yes      Yes                                 +---------+---------------+---------+-----------+----------+--------------+  PTV      Full                                                        +---------+---------------+---------+-----------+----------+--------------+ PERO     Full                                                        +---------+---------------+---------+-----------+----------+--------------+   +---------+---------------+---------+-----------+----------+--------------+ LEFT     CompressibilityPhasicitySpontaneityPropertiesThrombus Aging +---------+---------------+---------+-----------+----------+--------------+ CFV      Full           Yes      Yes                                 +---------+---------------+---------+-----------+----------+--------------+ SFJ      Full           Yes      Yes                                 +---------+---------------+---------+-----------+----------+--------------+ FV Prox  Full                                                        +---------+---------------+---------+-----------+----------+--------------+ FV Mid   Full                                                        +---------+---------------+---------+-----------+----------+--------------+ FV DistalFull                                                        +---------+---------------+---------+-----------+----------+--------------+ PFV      Full                                                         +---------+---------------+---------+-----------+----------+--------------+ POP      Full           Yes      Yes                                 +---------+---------------+---------+-----------+----------+--------------+ PTV      Full                                                        +---------+---------------+---------+-----------+----------+--------------+  PERO     Full                                                        +---------+---------------+---------+-----------+----------+--------------+     Summary: BILATERAL: - No evidence of deep vein thrombosis seen in the lower extremities, bilaterally. -No evidence of popliteal cyst, bilaterally.   *See table(s) above for measurements and observations. Electronically signed by Gaile New MD on 01/05/2023 at 8:29:35 PM.    Final    IR Radiologist Eval & Mgmt Result Date: 01/05/2023 INDICATION: Patient with history of end-stage cirrhosis, refractory ascites s/p tunneled peritoneal catheter placement 12/30/22 in IR. Patient admitted with abdominal pain and concern for malfunctioning Pleurx. EXAM: Pleurx catheter assessment TECHNIQUE: None CONTRAST:  None MEDICATIONS: None ANESTHESIA/SEDATION: None RADIOPHARMACEUTICALS:  None FLUOROSCOPY TIME:  None COMPARISON:  None FINDINGS: Pleurx insertion site clean, dry without erythema or edema. Retention suture is in tact. Pleurx accessed using sterile technique with immediate return of serosanguineous fluid. The catheter was attached to a Vacutainer and 8.0 L was removed. Sterile cap and dressing applied. PROCEDURE: See above. COMPLICATIONS: None immediate. IMPRESSION: Pleurx catheter accessed without issue. 8.0 L of serosanguineous fluid was removed which was the requested limit by the GI team. Performed by Clotilda Hesselbach, PA-C Electronically Signed   By: Marcey Moan M.D.   On: 01/05/2023 14:12   IR IMAGE GUIDED DRAINAGE PERCUT CATH  PERITONEAL RETROPERIT Result Date:  12/30/2022 INDICATION: 43 year old male with end-stage cirrhosis and refractory large volume ascites. He is not a candidate for tips and is going to hospice. Therefore, he presents for placement of a tunneled peritoneal drainage catheter. EXAM: Tunneled peritoneal drainage catheter. MEDICATIONS: None. ANESTHESIA/SEDATION: Fentanyl  75 mcg IV; Versed  3 mg IV administered intravenously by radiology nursing. Moderate Sedation Time:  12 minutes The patient's vital signs and level of consciousness were continuously monitored during the procedure by the interventional radiology nurse under my direct supervision. COMPLICATIONS: None immediate. PROCEDURE: Informed written consent was obtained from the patient after a thorough discussion of the procedural risks, benefits and alternatives. All questions were addressed. Maximal Sterile Barrier Technique was utilized including caps, mask, sterile gowns, sterile gloves, sterile drape, hand hygiene and skin antiseptic. A timeout was performed prior to the initiation of the procedure. The right lower quadrant was interrogated with ultrasound. There is a large pocket of fluid. The skin was sterilely prepped and draped in the standard fashion using chlorhexidine skin prep. Local anesthesia was attained by infiltration with 1% lidocaine . A small dermatotomy was made. An 18 gauge sheath needle was carefully advanced through the abdominal wall and into the fluid collection. The needle portion was removed and the sheath left in place. A 0.035 wire was advanced through the sheath and into pelvis. A suitable skin exit site was selected approximately 5 cm from the peritoneal entry site. Local anesthesia was again attained by infiltration with 1% lidocaine . A second dermatotomy was made. The tunneled catheter was then tunneled from the skin exit site to the dermatotomy overlying the peritoneal access site. The access sheath was then removed over the wire. The peel-away sheath was inserted  into the peritoneal cavity. The tunneled catheter was advanced through the peel-away sheath and the peel-away sheath was discarded. The dermatotomy over the peritoneal access site was closed with 3 0 Vicryl suture and  the epidermis sealed with Dermabond. The catheter was secured at the skin exit site with 0 Prolene suture. IMPRESSION: Successful placement of a tunneled peritoneal drainage catheter via the right lower quadrant. Electronically Signed   By: Wilkie Lent M.D.   On: 12/30/2022 16:31   IR Paracentesis Result Date: 12/28/2022 INDICATION: Patient with a history of cirrhosis with recurrent ascites. Interventional radiology asked to perform a diagnostic and therapeutic paracentesis. EXAM: ULTRASOUND GUIDED PARACENTESIS MEDICATIONS: 1% lidocaine  15 mL COMPLICATIONS: None immediate. PROCEDURE: Informed written consent was obtained from the patient after a discussion of the risks, benefits and alternatives to treatment. A timeout was performed prior to the initiation of the procedure. Initial ultrasound scanning demonstrates a large amount of ascites within the right lower abdominal quadrant. The right lower abdomen was prepped and draped in the usual sterile fashion. 1% lidocaine  was used for local anesthesia. Following this, a 19 gauge, 7-cm, Yueh catheter was introduced. An ultrasound image was saved for documentation purposes. The paracentesis was performed. The catheter was removed and a dressing was applied. The patient tolerated the procedure well without immediate post procedural complication. Patient received post-procedure intravenous albumin ; see nursing notes for details. FINDINGS: A total of approximately 5.6 L of blood-tinged fluid was removed. Samples were sent to the laboratory as requested by the clinical team. IMPRESSION: Successful ultrasound-guided paracentesis yielding 5.6 liters of peritoneal fluid. Procedure performed by Warren Dais NP PLAN: The patient has previously been  formally evaluated by the [facility] and is being actively followed for potential future intervention. Electronically Signed   By: Ester Sides M.D.   On: 12/28/2022 07:43   CT ABDOMEN PELVIS W CONTRAST Result Date: 12/26/2022 CLINICAL DATA:  Acute abdominal pain.  Cirrhosis. EXAM: CT ABDOMEN AND PELVIS WITH CONTRAST TECHNIQUE: Multidetector CT imaging of the abdomen and pelvis was performed using the standard protocol following bolus administration of intravenous contrast. RADIATION DOSE REDUCTION: This exam was performed according to the departmental dose-optimization program which includes automated exposure control, adjustment of the mA and/or kV according to patient size and/or use of iterative reconstruction technique. CONTRAST:  75mL OMNIPAQUE  IOHEXOL  350 MG/ML SOLN COMPARISON:  CT abdomen and pelvis 09/26/2022 FINDINGS: Lower chest: There is atelectasis in the lung bases. Hepatobiliary: The liver is small in size with nodular liver contour compatible with cirrhosis. Gallstones are seen. There is no biliary ductal dilatation. Pancreas: Unremarkable. No pancreatic ductal dilatation or surrounding inflammatory changes. Spleen: Mildly enlarged, unchanged. Adrenals/Urinary Tract: Adrenal glands are unremarkable. Kidneys are normal, without renal calculi, focal lesion, or hydronephrosis. Bladder is unremarkable. Stomach/Bowel: Stomach is within normal limits. Appendix appears normal. No evidence of bowel wall thickening, distention, or inflammatory changes. Appendix is not seen. Vascular/Lymphatic: Aorta and IVC are normal in size. Portal vein appears patent. Varices are seen in the right abdomen. No enlarged lymph nodes are identified. Reproductive: Prostate is unremarkable. Scrotal wall edema versus small hydroceles. Other: There is a very large amount of ascites throughout the abdomen and pelvis which has increased from prior. There is an umbilical hernia  containing ascites which has increased in size. There is diffuse body wall edema. There is a small amount of layering hyperdensity in the right paracolic gutter image 3/58 which was not seen on the prior exam. No free intraperitoneal air. Musculoskeletal: No acute or significant osseous findings. IMPRESSION: 1. Very large amount of ascites throughout the abdomen and pelvis which has increased from prior. 2. Small amount of layering hyperdensity in the right  paracolic gutter which was not seen on the prior exam. Findings may represent small amount of hemorrhage. 3. 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 4. Cholelithiasis. 5. Diffuse body wall edema. 6. Umbilical hernia containing ascites has increased in size. Electronically Signed   By: Greig Pique M.D.   On: 12/26/2022 20:43   IR Paracentesis Result Date: 12/21/2022 INDICATION: Alcoholic cirrhosis with recurrent ascites EXAM: ULTRASOUND GUIDED LEFT SIDED PARACENTESIS MEDICATIONS: 10 mL 1% lidocaine  COMPLICATIONS: None immediate. PROCEDURE: Informed written consent was obtained from the patient after a discussion of the risks, benefits and alternatives to treatment. A timeout was performed prior to the initiation of the procedure. Initial ultrasound scanning demonstrates a large amount of ascites within the left lower abdominal quadrant. The left lower abdomen was prepped and draped in the usual sterile fashion. 1% lidocaine  was used for local anesthesia. Following this, a 19 gauge, 7-cm, Yueh catheter was introduced. An ultrasound image was saved for documentation purposes. The paracentesis was performed. The catheter was removed and a dressing was applied. The patient tolerated the procedure well without immediate post procedural complication. Patient received post-procedure intravenous albumin ; see nursing notes for details. FINDINGS: A total of approximately 5 L of clear, yellow  fluid was removed.  IMPRESSION: Successful ultrasound-guided paracentesis yielding 5L liters of peritoneal fluid. PLAN: The patient has previously been formally evaluated by the [facility] and is being actively followed for potential future intervention. Procedure performed by Daved Hipp PA-C Electronically Signed   By: Norleen Roulette M.D.   On: 12/21/2022 16:30    Physical Exam: There were no vitals taken for this visit. Constitutional: Pleasant,well-developed, ***male in no acute distress. HEENT: Normocephalic and atraumatic. Conjunctivae are normal. No scleral icterus. Neck supple.  Cardiovascular: Normal rate, regular rhythm.  Pulmonary/chest: Effort normal and breath sounds normal. No wheezing, rales or rhonchi. Abdominal: Soft, nondistended, nontender. Bowel sounds active throughout. There are no masses palpable. No hepatomegaly. Extremities: no edema Lymphadenopathy: No cervical adenopathy noted. Neurological: Alert and oriented to person place and time. Skin: Skin is warm and dry. No rashes noted. Psychiatric: Normal mood and affect. Behavior is normal.   ASSESSMENT: 43 y.o. male here for assessment of the following  No diagnosis found.  PLAN:   Brien Belvie BRAVO, MD

## 2023-01-19 ENCOUNTER — Inpatient Hospital Stay: Payer: Self-pay | Admitting: Physician Assistant

## 2023-01-19 NOTE — Progress Notes (Deleted)
 Patient ID: Mathew [name], male   DOB: Jan 18, 1980, 43 y.o.   MRN: [id number]   Discharge Diagnoses: Principal Problem:   Acute on chronic hyponatremia Active Problems:   Alcoholic cirrhosis (HCC)   Alcohol use disorder   End stage liver disease (HCC)   Chronic anemia   Refractory ascites   Chronic idiopathic thrombocytopenia [facility])   Hospital Course: 43 y.o. 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     Last drink yesterday 12/24. Patient reported generalized abdominal pain which is chronic in nature.  However he is endorsing generalized weakness.   Patient is currently on hospice care stated that on daily basis he has almost 1 L of output through the abdominal drain. Currently patient is on hospice care for end-stage liver disease and hospice nurse coming to patient so managing and draining the abdominal Pleurx catheter.  Patient is also DNR and DNI based on palliative discussion during last hospital admission.   He was admitted and had > 13L drained from peritoneal cavity. He was covered for SBP empirically though he was afebrile with normal WBC. After day 3 of antibiotics, he had fever. Blood culture collected, though no growth noted. Midodrine  was given for BP support, diuretics held. He has suffered acute renal failure with worsening hyponatremia, and no po intake. Mental status worsening. Palliative care has continued following and based on discussions with the family, given  that the patient's clinical status appears to be worsening despite maximal medical therapy, we will discharge to residential hospice.    Assessment and Plan: Patient is home hospice.  All treatment currently is palliative, he is DNR.   - Patient not taking any po. He requires residential hospice management at this time. We will plan transfer today based on discussions with family.     End-stage liver disease caused by alcoholic cirrhosis, ongoing alcohol abuse with refractory/recurrent ascites, now developing hepatorenal syndrome: Here with massive ascites and abdominal pain, has indwelling abdominal Pleurx catheter, patient is terminal and with home hospice at home, counseled strictly to abstain from alcohol, 8 L of peritoneal fluid removed via his Pleurx catheter on 01/05/2023, 7 L on 01/06/2023, along with midodrine , IV fluids and albumin .  - Given his decompensation when fluid removed, would avoid large volume paracentesis. If needed for comfort, remove less volume at a time.    - Continue CTX for SBP empirically. Pt does have a fever, though WBC remains stable/normal. Blood cultures drawn 12/29 have yet to grow anything. No  - Continue lactulose , rifaximin . Note ammonia level is rising regardless. Would consider not continuing this at hospice.    Hyponatremia, hyperkalemia, AKI. Na worsening down to 115, Cr up 0.8 > 1.6. K, bicarb are stable. Consistent with hepatorenal syndrome. Prognosis extremely poor. - Hold all diuretics for now, fluid removal via abdominal Pleurx catheter,     Thrombocytopenia and anemia of chronic disease, lymphopenia.  Due to #1 above.  Supportive care. Avoid anticoagulation since platelets are < 50k and we are transitioning to residential hospice. Hgb on 12/30 is 7g/dl. Do not believe transfusion would improve his comfort at this time.

## 2023-01-26 ENCOUNTER — Encounter: Admitting: Licensed Clinical Social Worker

## 2023-02-11 DEATH — deceased

## 2023-02-13 IMAGING — US IR PARACENTESIS
1 series · 6 of 6 positions shown · non-contrast
Comparison: none

INDICATION: Patient with history of alcoholic cirrhosis, new onset ascites.
Request to IR for therapeutic paracentesis.

[Series 1: ir (id) (id)/(id)/(id) ir · 6 of 6 slices shown]
[im 1/6]
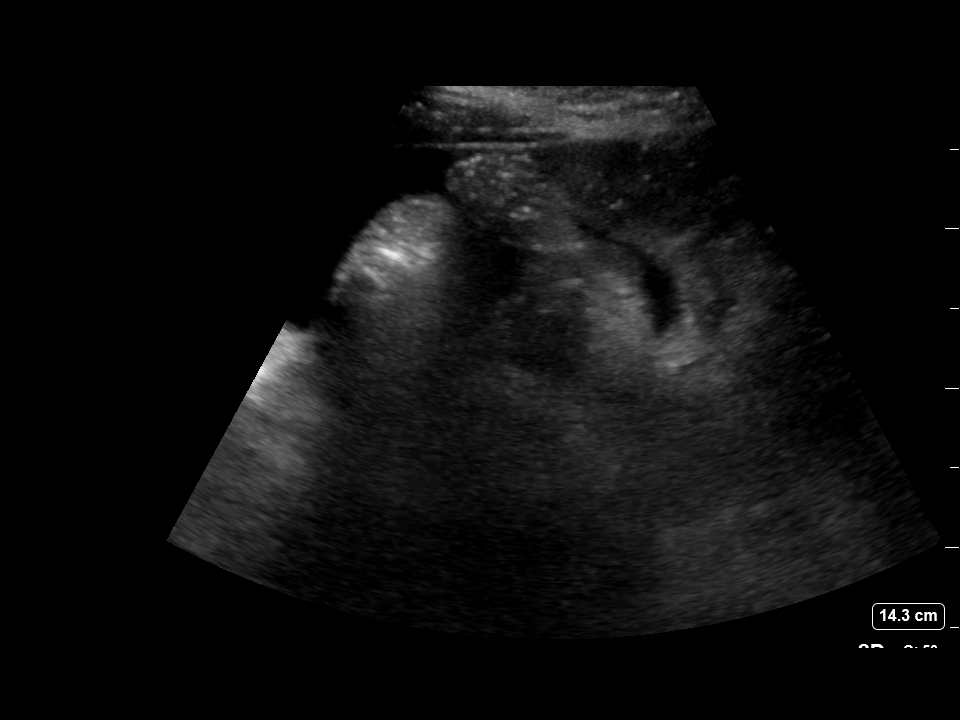
[im 2/6]
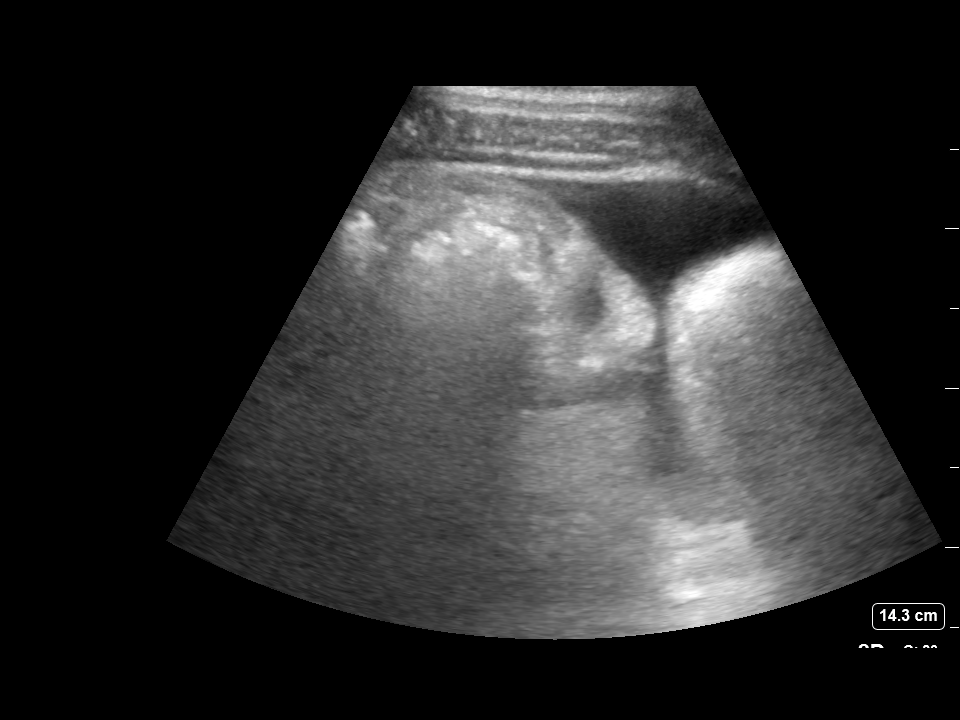
[im 3/6]
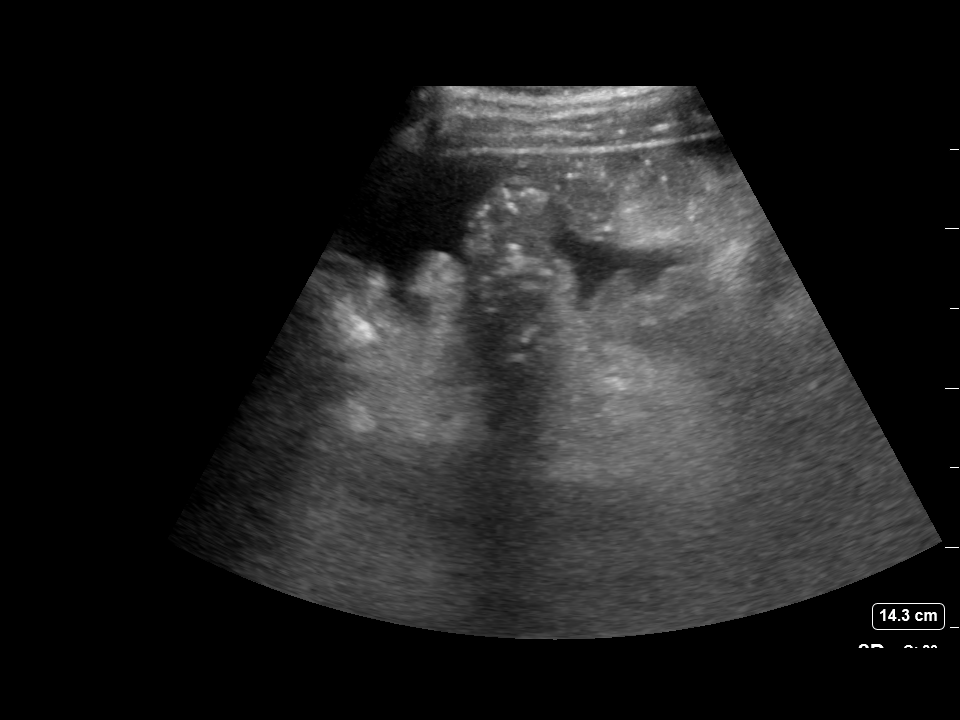
[im 4/6]
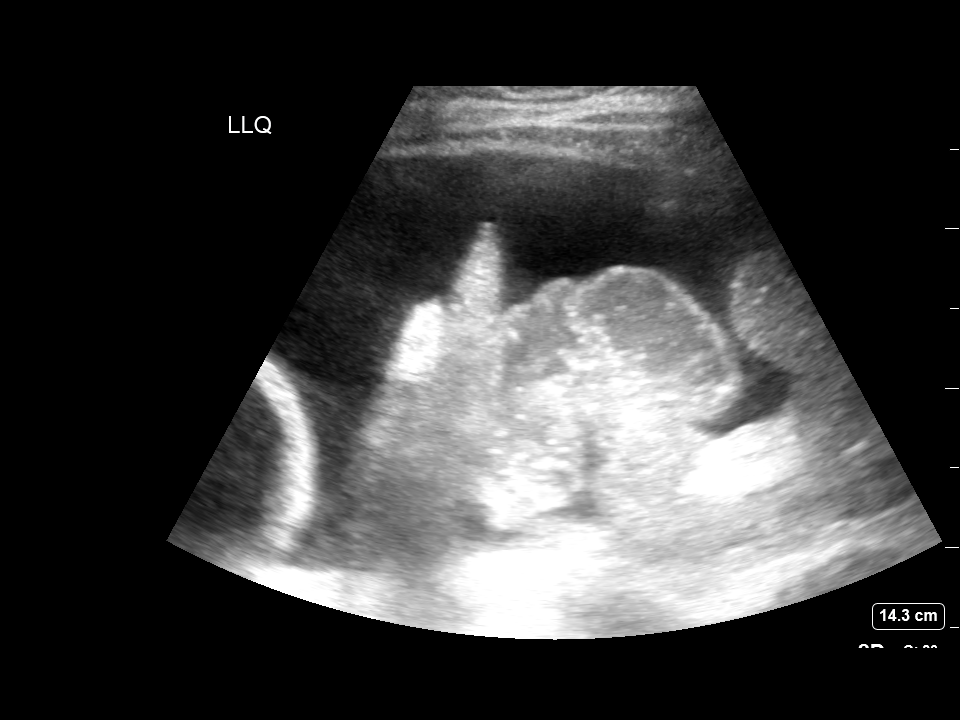
[im 5/6]
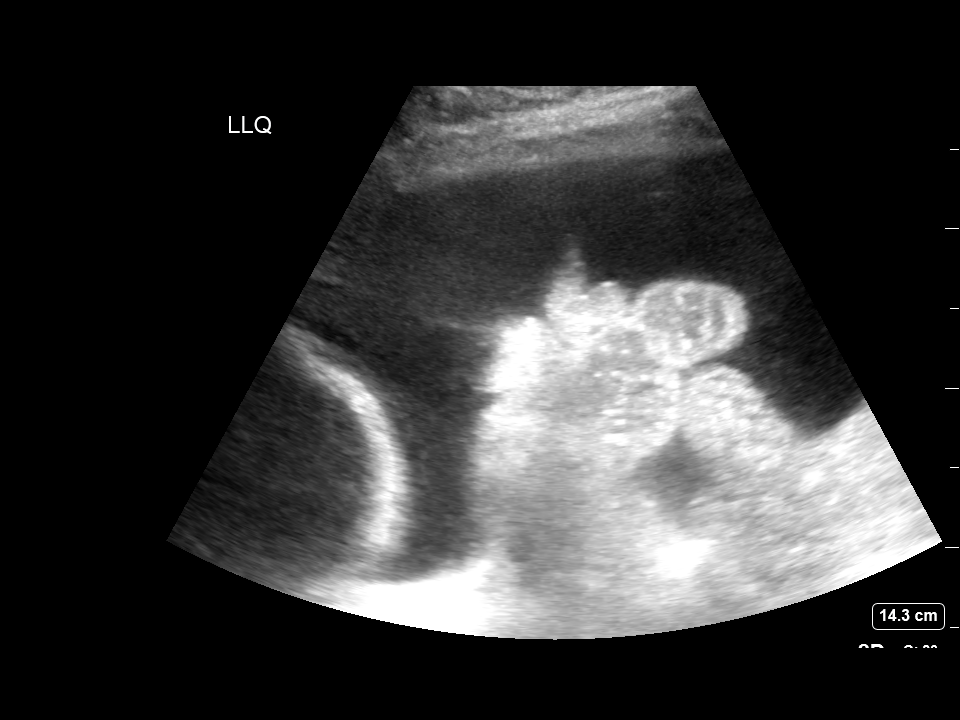
[im 6/6]
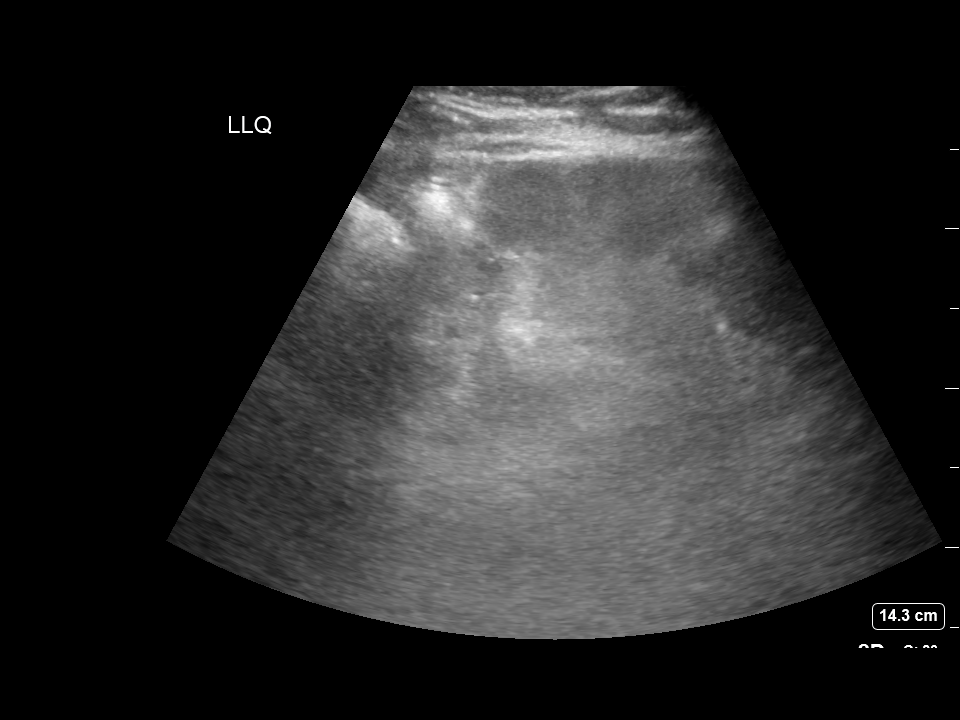

[6 of 6 positions shown; findings below may reference images not displayed]

EXAM:
ULTRASOUND GUIDED THERAPEUTIC PARACENTESIS

MEDICATIONS:
10 mL 1% lidocaine

COMPLICATIONS:
None immediate.

PROCEDURE:
Informed written consent was obtained from the patient after a
discussion of the risks, benefits and alternatives to treatment. A
timeout was performed prior to the initiation of the procedure.

Initial ultrasound scanning demonstrates a small amount of ascites
within the left lower abdominal quadrant. The left lower abdomen was
prepped and draped in the usual sterile fashion. 1% lidocaine was
used for local anesthesia.

Following this, a 19 gauge, 7-cm, Yueh catheter was introduced. An
ultrasound image was saved for documentation purposes. The
paracentesis was performed. The catheter was removed and a dressing
was applied. The patient tolerated the procedure well without
immediate post procedural complication.
FINDINGS: A total of approximately 1.6 L of clear yellow fluid was removed.
IMPRESSION: Successful ultrasound-guided paracentesis yielding 1.6 liters of
peritoneal fluid.

Read by Octalis, Admise

## 2023-02-13 IMAGING — CT CT ABD-PELV W/ CM
2 of 5 series · 16 of 46 positions shown, 18 images · IV contrast (APPLIED)
Comparison: 07/21/2020

CLINICAL DATA: Abdominal distension, ventral hernia, worsening
ascites, paracentesis today

EXAM:
CT ABDOMEN AND PELVIS WITH CONTRAST
TECHNIQUE: Multidetector CT imaging of the abdomen and pelvis was performed
using the standard protocol following bolus administration of
intravenous contrast.
CONTRAST:  80mL OMNIPAQUE IOHEXOL 300 MG/ML  SOLN

[Series 3: abd/ pelvis 5.0 i30f 2 · axial · 0.76mm/px · z∈[+867,+1302]mm · 13 of 97 slices shown, 15 images]
[im 5/97  soft-tissue]
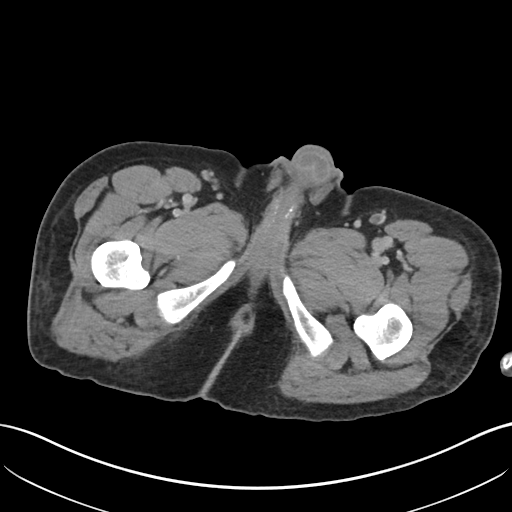
[im 5/97  bone]
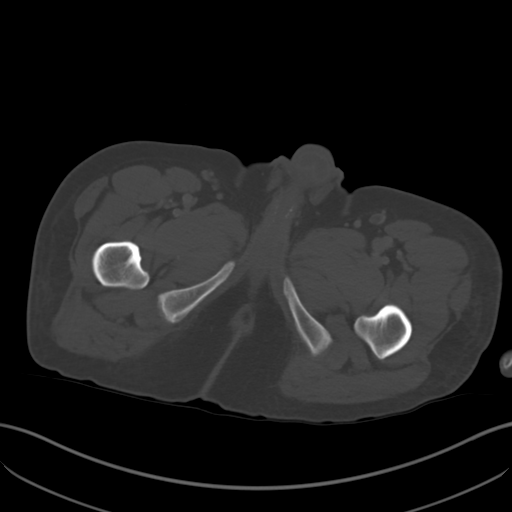
[im 15/97  soft-tissue]
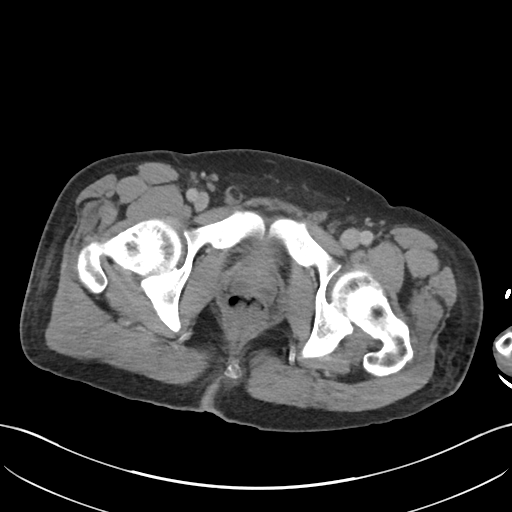
[im 20/97  soft-tissue]
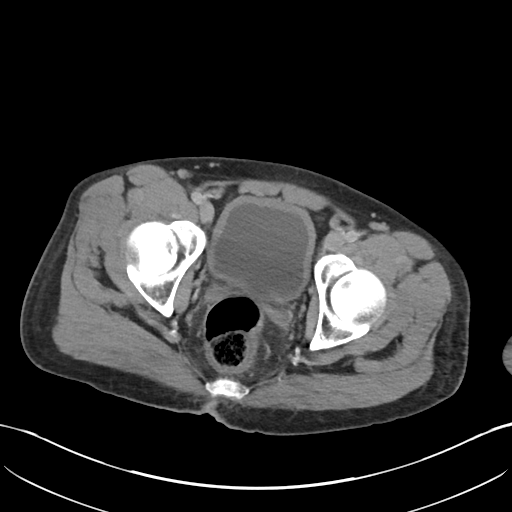
[im 29/97  soft-tissue]
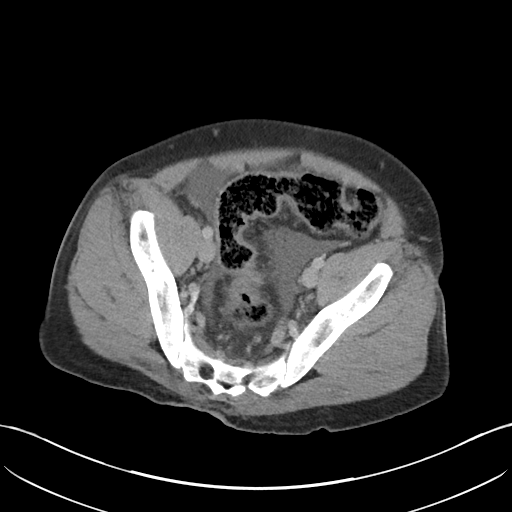
[im 34/97  soft-tissue]
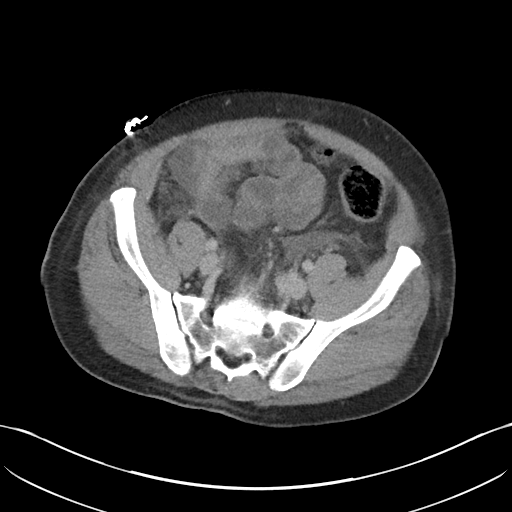
[im 44/97  soft-tissue]
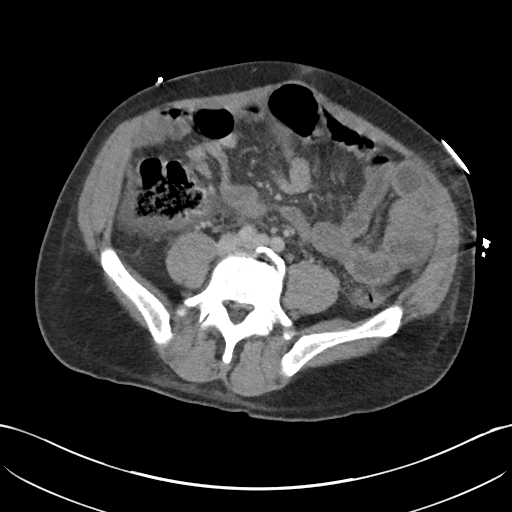
[im 49/97  soft-tissue]
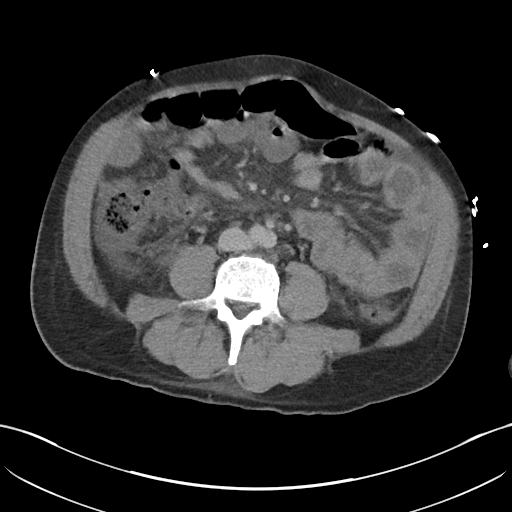
[im 53/97  soft-tissue]
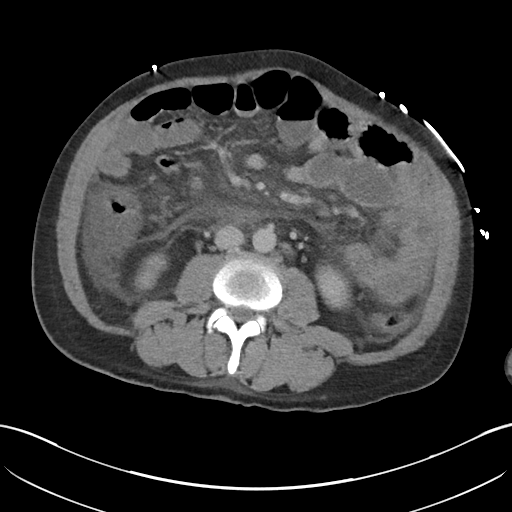
[im 63/97  soft-tissue]
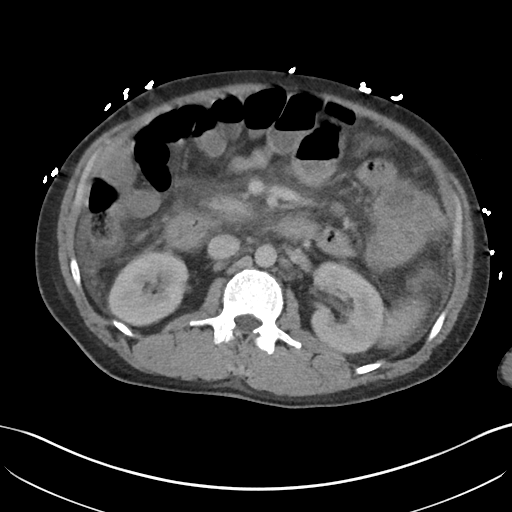
[im 63/97  bone]
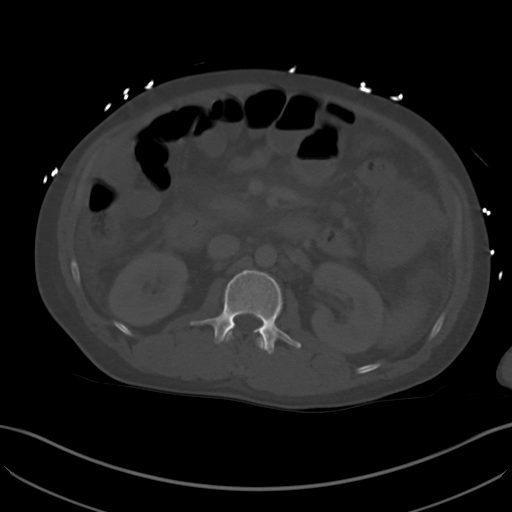
[im 68/97  soft-tissue]
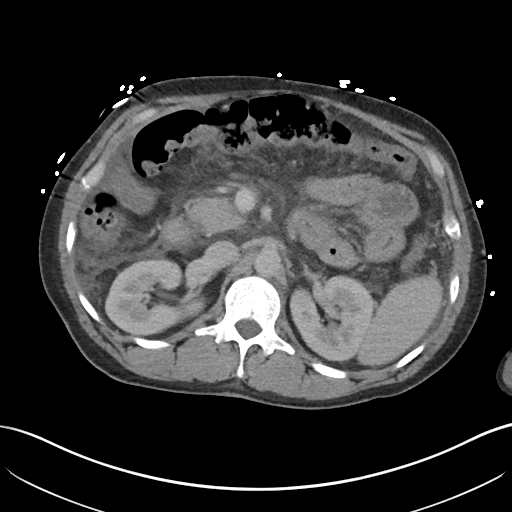
[im 77/97  soft-tissue]
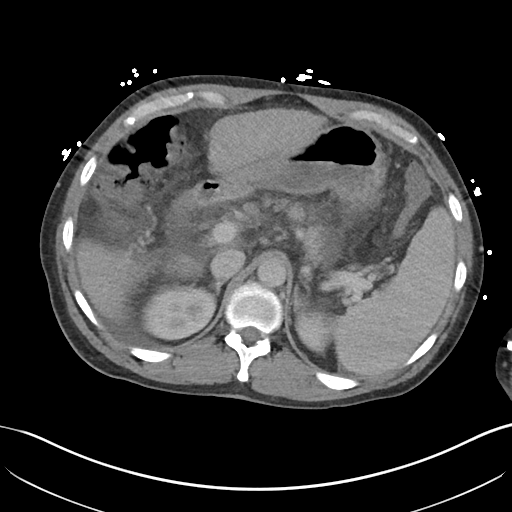
[im 82/97  soft-tissue]
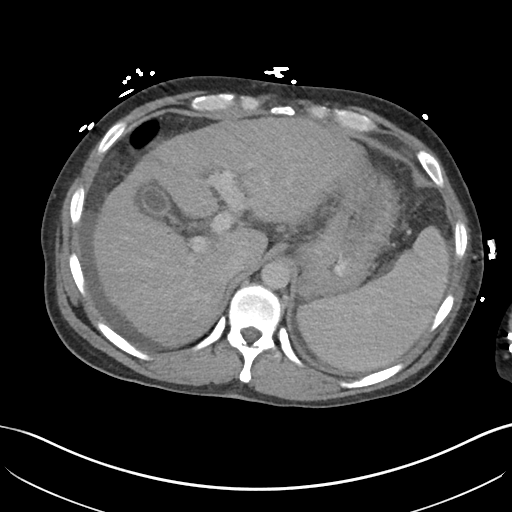
[im 92/97  soft-tissue]
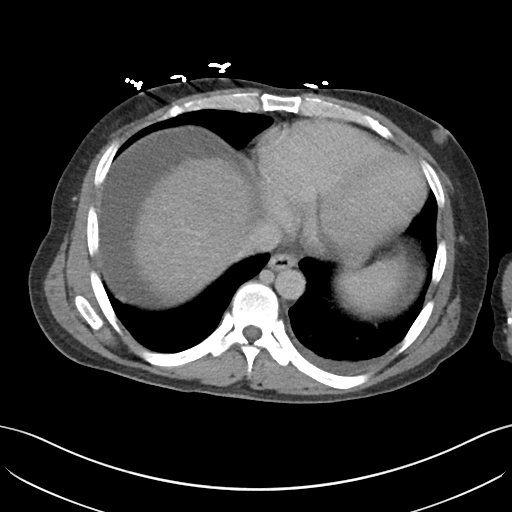

[Series 7: coronal soft tissue · coronal · 0.75mm/px · 3 of 98 slices shown]
[im 33/98  soft-tissue]
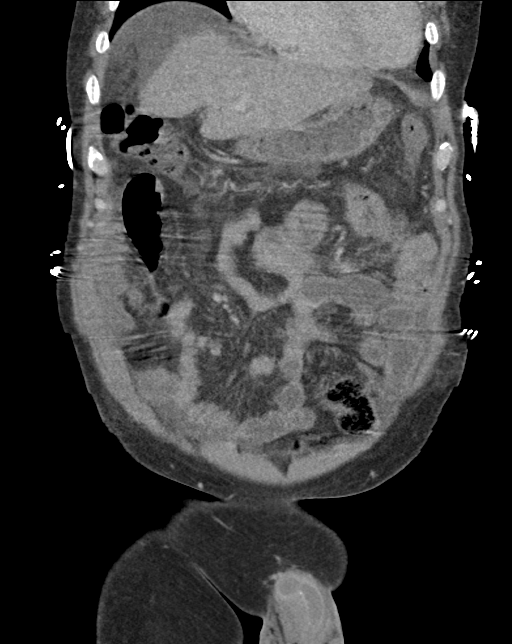
[im 44/98  soft-tissue]
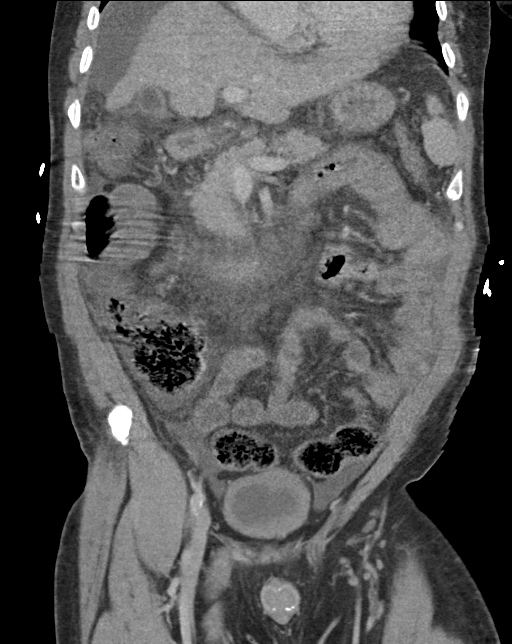
[im 54/98  soft-tissue]
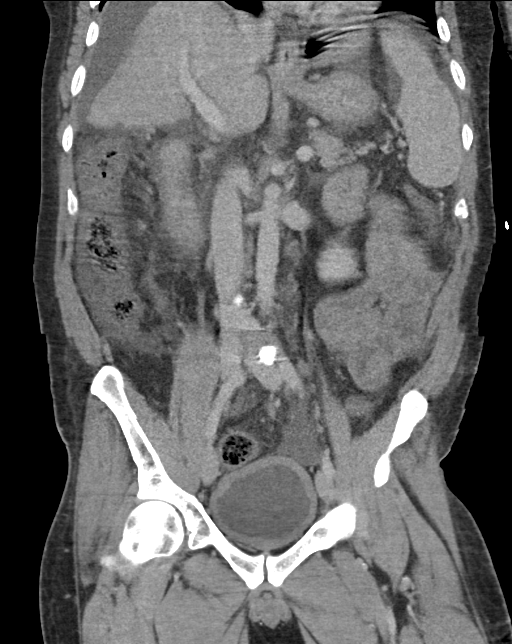

[16 of 46 positions shown; findings below may reference images not displayed]

FINDINGS: Lower chest: Small left pleural effusion associated atelectasis or
consolidation, similar to prior examination.

Hepatobiliary: Coarse, nodular contour of the liver. No solid liver
abnormality is seen. Contracted gallbladder with tiny gallstones
near the gallbladder neck (series 3, image 18). No biliary ductal
dilatation.

Pancreas: Unremarkable. No pancreatic ductal dilatation or
surrounding inflammatory changes.

Spleen: Splenomegaly, maximum coronal span 15.6 cm.

Adrenals/Urinary Tract: Adrenal glands are unremarkable. Kidneys are
normal, without renal calculi, solid lesion, or hydronephrosis. Wall
thickening of the urinary bladder, similar to prior examination.

Stomach/Bowel: Stomach is within normal limits. Wall thickening of
the cecum (series 7, image 49).

Vascular/Lymphatic: No significant vascular findings are present. No
enlarged abdominal or pelvic lymph nodes.

Reproductive: No mass or other significant abnormality.

Other: No abdominal wall hernia or abnormality. Moderate volume
ascites throughout the abdomen and pelvis, similar in volume to
prior examination.

Musculoskeletal: No acute or significant osseous findings.
IMPRESSION: 1. Moderate volume ascites throughout the abdomen and pelvis,
similar in volume to prior examination.
2. Coarse, nodular contour of the liver and splenomegaly, consistent
with cirrhosis and portal hypertension.
3. Cholelithiasis.
4. Wall thickening of the cecum, likely reflecting portal colopathy
in the setting of cirrhosis and ascites.
5. Small left pleural effusion and associated atelectasis or
consolidation, similar to prior examination.
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
